# Patient Record
Sex: Female | Born: 1970
Health system: Southern US, Community
[De-identification: ages and names within clinical notes are randomized; demographics above are authoritative.]

## PROBLEM LIST (undated history)

## (undated) ENCOUNTER — Ambulatory Visit (HOSPITAL_COMMUNITY)

## (undated) DIAGNOSIS — Z91199 Patient's noncompliance with other medical treatment and regimen due to unspecified reason: Secondary | ICD-10-CM

## (undated) DIAGNOSIS — F419 Anxiety disorder, unspecified: Secondary | ICD-10-CM

## (undated) DIAGNOSIS — D649 Anemia, unspecified: Secondary | ICD-10-CM

## (undated) DIAGNOSIS — I639 Cerebral infarction, unspecified: Secondary | ICD-10-CM

## (undated) DIAGNOSIS — G4730 Sleep apnea, unspecified: Secondary | ICD-10-CM

## (undated) DIAGNOSIS — I351 Nonrheumatic aortic (valve) insufficiency: Secondary | ICD-10-CM

## (undated) DIAGNOSIS — E785 Hyperlipidemia, unspecified: Secondary | ICD-10-CM

## (undated) DIAGNOSIS — F32A Depression, unspecified: Secondary | ICD-10-CM

## (undated) DIAGNOSIS — M1990 Unspecified osteoarthritis, unspecified site: Secondary | ICD-10-CM

## (undated) DIAGNOSIS — E663 Overweight: Secondary | ICD-10-CM

## (undated) DIAGNOSIS — Z9119 Patient's noncompliance with other medical treatment and regimen: Secondary | ICD-10-CM

## (undated) DIAGNOSIS — G43909 Migraine, unspecified, not intractable, without status migrainosus: Secondary | ICD-10-CM

## (undated) DIAGNOSIS — Z8673 Personal history of transient ischemic attack (TIA), and cerebral infarction without residual deficits: Secondary | ICD-10-CM

## (undated) DIAGNOSIS — I119 Hypertensive heart disease without heart failure: Secondary | ICD-10-CM

## (undated) DIAGNOSIS — I10 Essential (primary) hypertension: Secondary | ICD-10-CM

## (undated) DIAGNOSIS — I6930 Unspecified sequelae of cerebral infarction: Secondary | ICD-10-CM

## (undated) DIAGNOSIS — F329 Major depressive disorder, single episode, unspecified: Secondary | ICD-10-CM

## (undated) HISTORY — DX: Patient's noncompliance with other medical treatment and regimen due to unspecified reason: Z91.199

## (undated) HISTORY — DX: Unspecified sequelae of cerebral infarction: I69.30

## (undated) HISTORY — DX: Anxiety disorder, unspecified: F41.9

## (undated) HISTORY — DX: Personal history of transient ischemic attack (TIA), and cerebral infarction without residual deficits: Z86.73

## (undated) HISTORY — DX: Major depressive disorder, single episode, unspecified: F32.9

## (undated) HISTORY — DX: Migraine, unspecified, not intractable, without status migrainosus: G43.909

## (undated) HISTORY — DX: Nonrheumatic aortic (valve) insufficiency: I35.1

## (undated) HISTORY — DX: Depression, unspecified: F32.A

## (undated) HISTORY — DX: Unspecified osteoarthritis, unspecified site: M19.90

## (undated) HISTORY — PX: GANGLION CYST EXCISION: SHX1691

## (undated) HISTORY — DX: Patient's noncompliance with other medical treatment and regimen: Z91.19

## (undated) HISTORY — DX: Anemia, unspecified: D64.9

## (undated) HISTORY — DX: Sleep apnea, unspecified: G47.30

## (undated) HISTORY — DX: Hypertensive heart disease without heart failure: I11.9

## (undated) HISTORY — PX: TONSILLECTOMY: SUR1361

## (undated) HISTORY — DX: Hyperlipidemia, unspecified: E78.5

## (undated) HISTORY — DX: Overweight: E66.3

---

## 1999-12-23 ENCOUNTER — Emergency Department (HOSPITAL_COMMUNITY): Admission: EM | Admit: 1999-12-23 | Discharge: 1999-12-23 | Payer: Self-pay | Admitting: Internal Medicine

## 1999-12-25 ENCOUNTER — Encounter: Admission: RE | Admit: 1999-12-25 | Discharge: 1999-12-25 | Payer: Self-pay | Admitting: Internal Medicine

## 1999-12-27 ENCOUNTER — Encounter: Admission: RE | Admit: 1999-12-27 | Discharge: 1999-12-27 | Payer: Self-pay | Admitting: Internal Medicine

## 2000-01-01 ENCOUNTER — Encounter: Admission: RE | Admit: 2000-01-01 | Discharge: 2000-01-01 | Payer: Self-pay | Admitting: Hematology and Oncology

## 2000-01-14 ENCOUNTER — Emergency Department (HOSPITAL_COMMUNITY): Admission: EM | Admit: 2000-01-14 | Discharge: 2000-01-14 | Payer: Self-pay | Admitting: Emergency Medicine

## 2000-02-07 ENCOUNTER — Encounter: Admission: RE | Admit: 2000-02-07 | Discharge: 2000-02-07 | Payer: Self-pay | Admitting: Internal Medicine

## 2000-02-11 ENCOUNTER — Encounter: Admission: RE | Admit: 2000-02-11 | Discharge: 2000-02-11 | Payer: Self-pay | Admitting: Internal Medicine

## 2000-07-21 ENCOUNTER — Encounter: Admission: RE | Admit: 2000-07-21 | Discharge: 2000-07-21 | Payer: Self-pay

## 2000-07-28 ENCOUNTER — Encounter: Admission: RE | Admit: 2000-07-28 | Discharge: 2000-07-28 | Payer: Self-pay | Admitting: Hematology and Oncology

## 2000-07-28 ENCOUNTER — Other Ambulatory Visit: Admission: RE | Admit: 2000-07-28 | Discharge: 2000-07-28 | Payer: Self-pay | Admitting: Hematology and Oncology

## 2000-08-17 ENCOUNTER — Emergency Department (HOSPITAL_COMMUNITY): Admission: EM | Admit: 2000-08-17 | Discharge: 2000-08-17 | Payer: Self-pay | Admitting: *Deleted

## 2000-09-22 ENCOUNTER — Encounter: Admission: RE | Admit: 2000-09-22 | Discharge: 2000-09-22 | Payer: Self-pay | Admitting: Hematology and Oncology

## 2000-11-17 ENCOUNTER — Emergency Department (HOSPITAL_COMMUNITY): Admission: EM | Admit: 2000-11-17 | Discharge: 2000-11-17 | Payer: Self-pay | Admitting: Emergency Medicine

## 2000-12-15 ENCOUNTER — Ambulatory Visit (HOSPITAL_BASED_OUTPATIENT_CLINIC_OR_DEPARTMENT_OTHER): Admission: RE | Admit: 2000-12-15 | Discharge: 2000-12-15 | Payer: Self-pay | Admitting: Neurology

## 2000-12-17 ENCOUNTER — Encounter: Admission: RE | Admit: 2000-12-17 | Discharge: 2000-12-17 | Payer: Self-pay | Admitting: Internal Medicine

## 2000-12-23 ENCOUNTER — Ambulatory Visit (HOSPITAL_COMMUNITY): Admission: RE | Admit: 2000-12-23 | Discharge: 2000-12-23 | Payer: Self-pay | Admitting: Internal Medicine

## 2002-03-13 ENCOUNTER — Emergency Department (HOSPITAL_COMMUNITY): Admission: EM | Admit: 2002-03-13 | Discharge: 2002-03-13 | Payer: Self-pay | Admitting: Emergency Medicine

## 2004-02-02 ENCOUNTER — Emergency Department (HOSPITAL_COMMUNITY): Admission: EM | Admit: 2004-02-02 | Discharge: 2004-02-02 | Payer: Self-pay | Admitting: Emergency Medicine

## 2004-09-27 ENCOUNTER — Emergency Department (HOSPITAL_COMMUNITY): Admission: EM | Admit: 2004-09-27 | Discharge: 2004-09-27 | Payer: Self-pay | Admitting: Emergency Medicine

## 2004-10-24 ENCOUNTER — Emergency Department (HOSPITAL_COMMUNITY): Admission: EM | Admit: 2004-10-24 | Discharge: 2004-10-24 | Payer: Self-pay | Admitting: Emergency Medicine

## 2004-12-30 ENCOUNTER — Emergency Department (HOSPITAL_COMMUNITY): Admission: EM | Admit: 2004-12-30 | Discharge: 2004-12-30 | Payer: Self-pay | Admitting: Emergency Medicine

## 2006-10-13 ENCOUNTER — Emergency Department (HOSPITAL_COMMUNITY): Admission: EM | Admit: 2006-10-13 | Discharge: 2006-10-13 | Payer: Self-pay | Admitting: Family Medicine

## 2006-10-15 ENCOUNTER — Emergency Department (HOSPITAL_COMMUNITY): Admission: EM | Admit: 2006-10-15 | Discharge: 2006-10-15 | Payer: Self-pay | Admitting: Family Medicine

## 2006-10-16 ENCOUNTER — Emergency Department (HOSPITAL_COMMUNITY): Admission: EM | Admit: 2006-10-16 | Discharge: 2006-10-16 | Payer: Self-pay | Admitting: Emergency Medicine

## 2006-11-10 ENCOUNTER — Encounter: Admission: RE | Admit: 2006-11-10 | Discharge: 2006-12-07 | Payer: Self-pay | Admitting: Orthopaedic Surgery

## 2006-12-12 ENCOUNTER — Encounter: Admission: RE | Admit: 2006-12-12 | Discharge: 2006-12-12 | Payer: Self-pay | Admitting: Orthopaedic Surgery

## 2007-01-05 ENCOUNTER — Encounter (INDEPENDENT_AMBULATORY_CARE_PROVIDER_SITE_OTHER): Payer: Self-pay | Admitting: Orthopedic Surgery

## 2007-01-05 ENCOUNTER — Ambulatory Visit (HOSPITAL_BASED_OUTPATIENT_CLINIC_OR_DEPARTMENT_OTHER): Admission: RE | Admit: 2007-01-05 | Discharge: 2007-01-05 | Payer: Self-pay | Admitting: Orthopedic Surgery

## 2007-05-13 ENCOUNTER — Other Ambulatory Visit: Admission: RE | Admit: 2007-05-13 | Discharge: 2007-05-13 | Payer: Self-pay | Admitting: Family Medicine

## 2007-06-02 ENCOUNTER — Encounter: Admission: RE | Admit: 2007-06-02 | Discharge: 2007-06-02 | Payer: Self-pay | Admitting: Family Medicine

## 2008-12-26 ENCOUNTER — Emergency Department (HOSPITAL_COMMUNITY): Admission: EM | Admit: 2008-12-26 | Discharge: 2008-12-26 | Payer: Self-pay | Admitting: Emergency Medicine

## 2009-10-22 ENCOUNTER — Emergency Department (HOSPITAL_COMMUNITY): Admission: EM | Admit: 2009-10-22 | Discharge: 2009-10-23 | Payer: Self-pay | Admitting: Emergency Medicine

## 2010-03-18 ENCOUNTER — Emergency Department (HOSPITAL_COMMUNITY): Admission: EM | Admit: 2010-03-18 | Discharge: 2010-03-18 | Payer: Self-pay | Admitting: Emergency Medicine

## 2010-06-23 ENCOUNTER — Encounter: Payer: Self-pay | Admitting: Family Medicine

## 2010-08-14 LAB — DIFFERENTIAL
Basophils Absolute: 0 10*3/uL (ref 0.0–0.1)
Basophils Relative: 1 % (ref 0–1)
Eosinophils Absolute: 0.2 10*3/uL (ref 0.0–0.7)
Eosinophils Relative: 4 % (ref 0–5)
Lymphocytes Relative: 35 % (ref 12–46)
Lymphs Abs: 1.5 10*3/uL (ref 0.7–4.0)
Monocytes Absolute: 0.4 10*3/uL (ref 0.1–1.0)
Monocytes Relative: 9 % (ref 3–12)
Neutro Abs: 2.2 10*3/uL (ref 1.7–7.7)
Neutrophils Relative %: 51 % (ref 43–77)

## 2010-08-14 LAB — POCT CARDIAC MARKERS
CKMB, poc: <1 ng/mL — ABNORMAL LOW (ref 1.0–8.0)
Myoglobin, poc: 77.7 ng/mL (ref 12–200)
Troponin i, poc: <0.05 ng/mL (ref 0.00–0.09)

## 2010-08-14 LAB — BASIC METABOLIC PANEL
BUN: 11 mg/dL (ref 6–23)
CO2: 25 mEq/L (ref 19–32)
Calcium: 8.9 mg/dL (ref 8.4–10.5)
Chloride: 105 mEq/L (ref 96–112)
Creatinine, Ser: 0.76 mg/dL (ref 0.4–1.2)
GFR calc Af Amer: >60 mL/min (ref >60)
GFR calc non Af Amer: >60 mL/min (ref >60)
Glucose, Bld: 101 mg/dL — ABNORMAL HIGH (ref 70–99)
Potassium: 3 mEq/L — ABNORMAL LOW (ref 3.5–5.1)
Sodium: 137 mEq/L (ref 135–145)

## 2010-08-14 LAB — D-DIMER, QUANTITATIVE: D-Dimer, Quant: 0.59 ug/mL-FEU — ABNORMAL HIGH (ref 0.00–0.48)

## 2010-08-14 LAB — CBC
HCT: 41.5 % (ref 36.0–46.0)
Hemoglobin: 14 g/dL (ref 12.0–15.0)
MCH: 28.7 pg (ref 26.0–34.0)
MCHC: 33.7 g/dL (ref 30.0–36.0)
MCV: 85 fL (ref 78.0–100.0)
Platelets: 242 10*3/uL (ref 150–400)
RBC: 4.88 MIL/uL (ref 3.87–5.11)
RDW: 14.6 % (ref 11.5–15.5)
WBC: 4.3 10*3/uL (ref 4.0–10.5)

## 2010-09-08 LAB — POCT I-STAT, CHEM 8
BUN: 14 mg/dL (ref 6–23)
Calcium, Ion: 1.14 mmol/L (ref 1.12–1.32)
Chloride: 104 mEq/L (ref 96–112)
Creatinine, Ser: 0.8 mg/dL (ref 0.4–1.2)
Glucose, Bld: 88 mg/dL (ref 70–99)
HCT: 43 % (ref 36.0–46.0)
Hemoglobin: 14.6 g/dL (ref 12.0–15.0)
Potassium: 3.3 mEq/L — ABNORMAL LOW (ref 3.5–5.1)
Sodium: 141 mEq/L (ref 135–145)
TCO2: 25 mmol/L (ref 0–100)

## 2010-10-15 NOTE — Op Note (Signed)
NAMETAYJA, MANZER           ACCOUNT NO.:  [id number]   MEDICAL RECORD NO.:  [id number]          PATIENT TYPE:  AMB   LOCATION:  [facility]                         FACILITY:  [facility]   PHYSICIAN:  Deidre Ala, M.D.    DATE OF BIRTH:  16-Aug-1970   DATE OF PROCEDURE:  01/05/2007  DATE OF DISCHARGE:                               OPERATIVE REPORT   PREOPERATIVE DIAGNOSIS:  Painful dorsal ganglion cyst, left wrist,  classic position.   POSTOPERATIVE DIAGNOSIS:  Painful dorsal ganglion cyst, left wrist,  classic position, with extensor tenosynovitis of third and fourth  extensor tendon sheath.   OPERATION:  1. Excision dorsal ganglion cyst to capsule with cuff of capsule, left      dorsal wrist.  2. Extensor tenosynovectomy of third and fourth extensor tendon      sheath.   SURGEON:  1. Charlesetta Shanks, M.D.   ASSISTANT:  Phineas Semen, P.A.   ANESTHESIA:  General with LMA.   CULTURES:  None.   DRAINS:  None.   BLOOD LOSS:  Minimal.   TOURNIQUET TIME:  36 minutes.   PATHOLOGIC FINDINGS AND HISTORY:  Brandy Roberts presented with a  dorsal wrist ganglion cyst from Dr. Loleta Chance in [place].  She had  left wrist pain that was severe starting on December 06, 2006.  Swelling in  her wrist went down, came into the office after seeing Dr. Loleta Chance.  We  tried to decompress the cyst after aspirating it.  An x-ray showed no  bony abnormalities.  We placed cortisone and Marcaine.  She came back  and she was still miserable.  The cyst measured about 1.5 x 1 cm over  the classic position in the wrist.  It had decompressed a bit and she  wanted to go ahead and have it incised.  At surgery the cyst had  somewhat decompressed.  There were multiple blebs within it.  It was  nesting between the third and fourth extensor tendon sheath and we did  release the extensor retinaculum partially to gain access to the base of  the ganglion.  We retracted the extensor carpi radialis and EPL one way  and  the extensor tendons in the indicus proprius and extensor digitorum  communis the other direction.  The ganglion had several stalks and went  down into the base of the tendon sheaths.  There was some fluid milkable  out of the third and fourth tendon sheath.  There were blebs down at the  classic capsular base of the bald head of the capitate base and the  scapholunate ligament area and lunate capitate lunate joint and we  excised these with a cuff of capsule as well as the overlying adjacent  base of the tendon sheath that was adherent to the ganglion cyst and  removed this in its entirety.  The specimen that we got out measured  about 1 x 1.5 cm.  It was sent for specimen.  Again we did cauterize the  capsule over a patch measuring about 1.5 x 1.5 cm to prevent bleb  recurrence.  Small cuff of capsule was taken out  in the area where the  stalk was producing the blebs in the area of the lunate at the base of  the capitate and over toward the scapholunate dorsal capsule.  Small  amount was taken and the area was cauterized.   DESCRIPTION OF PROCEDURE:  With adequate anesthesia obtained using LMA  technique, 1 gram Ancef given IV prophylaxis, the patient was placed in  the supine position.  The left upper extremity was prepped from the  32fingertips to the upper forearm in standard fashion.  After standard  prepping and draping, Esmarch examination was used and the tourniquet  let up to 250 mmHg.  A longitudinal skin incision was then made over the  dorsal prominence.  Incision was deepened sharply with a knife and  hemostasis obtained using the Bovie electrocoagulator.  Dissection was  carried down to the bulge through the 3/4 interspace.  We identified the  extensor retinaculum, released it for exposure and then retracted the  tendons of the ECRL and the EPL one way and the extensor digitorum and  indicus proprius the other direction.  Some of the base of the  tenosynovium of these tendon  sheaths were intimately involved with a  ganglion cyst and were excised with it.  We then dissected around the  multilobate cyst which had been decompressed and traced it down to its  stalk which still had some blebs coming out of it at the capsule as  listed above in the scapholunate and base of the capitate area.  This  capsule was then excised from that area in two small spots and then  cauterized with the needle point Bovie as above.  Irrigation was carried  out.  The tendons were then checked for their continuity and function.  Everything was good so we closed the subcutaneum with 3-0 Vicryl, two  stitches, and then a 4-0 Monocryl was placed subcuticular with Steri-  Strips.  Marcaine 0.5% was placed in and about the wound.  A bulky  sterile compressive dressing was applied with volar plaster splint and  slight cock-up with Ace wrap.  The patient, having tolerated procedure  well, was awakened, taken to recovery room in satisfactory condition to  be discharged per outpatient routine, given Vicodin for pain and told to  call the office for recheck on Friday.           ______________________________  V. Charlesetta Shanks, M.D.     VEP/MEDQ  D:  01/05/2007  T:  01/06/2007  Job:  [id number]   cc:   Annia Friendly. Loleta Chance, MD  Fax: [phone]

## 2011-03-17 LAB — POCT I-STAT 4, (NA,K, GLUC, HGB,HCT)
Glucose, Bld: 103 — ABNORMAL HIGH
HCT: 45
Hemoglobin: 15.3 — ABNORMAL HIGH
Operator id: 268271
Potassium: 3.8
Sodium: 141

## 2011-03-17 LAB — POCT PREGNANCY, URINE
Operator id: 268271
Preg Test, Ur: NEGATIVE

## 2011-10-04 ENCOUNTER — Emergency Department (HOSPITAL_COMMUNITY)
Admission: EM | Admit: 2011-10-04 | Discharge: 2011-10-04 | Disposition: A | Discharge status: Home / Self Care | Payer: No Typology Code available for payment source | Attending: Emergency Medicine | Admitting: Emergency Medicine

## 2011-10-04 ENCOUNTER — Emergency Department (HOSPITAL_COMMUNITY): Payer: No Typology Code available for payment source

## 2011-10-04 ENCOUNTER — Encounter (HOSPITAL_COMMUNITY): Payer: Self-pay | Admitting: Emergency Medicine

## 2011-10-04 DIAGNOSIS — S335XXA Sprain of ligaments of lumbar spine, initial encounter: Secondary | ICD-10-CM | POA: Insufficient documentation

## 2011-10-04 DIAGNOSIS — S39012A Strain of muscle, fascia and tendon of lower back, initial encounter: Secondary | ICD-10-CM

## 2011-10-04 DIAGNOSIS — S139XXA Sprain of joints and ligaments of unspecified parts of neck, initial encounter: Secondary | ICD-10-CM | POA: Insufficient documentation

## 2011-10-04 DIAGNOSIS — Y92410 Unspecified street and highway as the place of occurrence of the external cause: Secondary | ICD-10-CM | POA: Insufficient documentation

## 2011-10-04 DIAGNOSIS — S161XXA Strain of muscle, fascia and tendon at neck level, initial encounter: Secondary | ICD-10-CM

## 2011-10-04 DIAGNOSIS — I10 Essential (primary) hypertension: Secondary | ICD-10-CM | POA: Insufficient documentation

## 2011-10-04 HISTORY — DX: Essential (primary) hypertension: I10

## 2011-10-04 MED ORDER — KETOROLAC TROMETHAMINE 60 MG/2ML IM SOLN
60.0000 mg | Freq: Once | INTRAMUSCULAR | Status: AC
  Administered 2011-10-04: 60 mg via INTRAMUSCULAR
  Filled 2011-10-04: qty 2

## 2011-10-04 MED ORDER — HYDROCODONE-ACETAMINOPHEN 5-325 MG PO TABS: 1.0000 | ORAL_TABLET | Freq: Four times a day (QID) | ORAL | Status: AC | PRN

## 2011-10-04 MED ORDER — CYCLOBENZAPRINE HCL 10 MG PO TABS: 10.0000 mg | ORAL_TABLET | Freq: Three times a day (TID) | ORAL | Status: AC | PRN

## 2011-10-04 MED ORDER — IBUPROFEN 800 MG PO TABS: 800.0000 mg | ORAL_TABLET | Freq: Three times a day (TID) | ORAL | Status: AC | PRN
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
## 2011-10-04 NOTE — ED Provider Notes (Signed)
Medical screening examination/treatment/procedure(s) were performed by non-physician practitioner and as supervising physician I was immediately available for consultation/collaboration.   Nat Christen, MD 10/04/11 1259
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
## 2011-11-03 ENCOUNTER — Encounter (HOSPITAL_COMMUNITY): Payer: Self-pay | Admitting: *Deleted

## 2011-11-03 ENCOUNTER — Emergency Department (HOSPITAL_COMMUNITY)
Admission: EM | Admit: 2011-11-03 | Discharge: 2011-11-04 | Disposition: A | Discharge status: Home / Self Care | Payer: Self-pay | Attending: Emergency Medicine | Admitting: Emergency Medicine

## 2011-11-03 ENCOUNTER — Emergency Department (HOSPITAL_COMMUNITY): Payer: Self-pay

## 2011-11-03 DIAGNOSIS — K449 Diaphragmatic hernia without obstruction or gangrene: Secondary | ICD-10-CM | POA: Insufficient documentation

## 2011-11-03 DIAGNOSIS — R109 Unspecified abdominal pain: Secondary | ICD-10-CM | POA: Insufficient documentation

## 2011-11-03 DIAGNOSIS — I10 Essential (primary) hypertension: Secondary | ICD-10-CM | POA: Insufficient documentation

## 2011-11-03 LAB — URINE MICROSCOPIC-ADD ON

## 2011-11-03 LAB — POCT I-STAT, CHEM 8
BUN: 11 mg/dL (ref 6–23)
Chloride: 103 mEq/L (ref 96–112)
Sodium: 142 mEq/L (ref 135–145)

## 2011-11-03 LAB — URINALYSIS, ROUTINE W REFLEX MICROSCOPIC
Bilirubin Urine: NEGATIVE
Glucose, UA: NEGATIVE mg/dL
Glucose, UA: NEGATIVE mg/dL
Ketones, ur: NEGATIVE mg/dL
Nitrite: NEGATIVE
Specific Gravity, Urine: 1.014 (ref 1.005–1.030)
Urobilinogen, UA: 0.2 mg/dL (ref 0.0–1.0)
pH: 7 (ref 5.0–8.0)
pH: 6.5 (ref 5.0–8.0)

## 2011-11-03 LAB — DIFFERENTIAL
Eosinophils Relative: 2 % (ref 0–5)
Lymphocytes Relative: 39 % (ref 12–46)
Lymphs Abs: 3 10*3/uL (ref 0.7–4.0)
Neutrophils Relative %: 51 % (ref 43–77)

## 2011-11-03 LAB — CBC
MCV: 84.3 fL (ref 78.0–100.0)
Platelets: 259 10*3/uL (ref 150–400)
RBC: 4.64 MIL/uL (ref 3.87–5.11)
WBC: 7.7 10*3/uL (ref 4.0–10.5)

## 2011-11-03 LAB — POCT PREGNANCY, URINE: Preg Test, Ur: NEGATIVE

## 2011-11-03 MED ORDER — POTASSIUM CHLORIDE CRYS ER 20 MEQ PO TBCR
40.0000 meq | EXTENDED_RELEASE_TABLET | Freq: Once | ORAL | Status: AC
  Administered 2011-11-03: 40 meq via ORAL
  Filled 2011-11-03: qty 2

## 2011-11-03 MED ORDER — IOHEXOL 300 MG/ML  SOLN
20.0000 mL | INTRAMUSCULAR | Status: AC
Start: 2011-11-03 — End: 2011-11-03
  Administered 2011-11-03 (×2): 20 mL via ORAL

## 2011-11-03 MED ORDER — ONDANSETRON HCL 4 MG/2ML IJ SOLN
4.0000 mg | Freq: Once | INTRAMUSCULAR | Status: AC
  Administered 2011-11-03: 4 mg via INTRAVENOUS
  Filled 2011-11-03: qty 2

## 2011-11-03 MED ORDER — SODIUM CHLORIDE 0.9 % IV SOLN
INTRAVENOUS | Status: DC
  Administered 2011-11-03: 125 mL/h via INTRAVENOUS

## 2011-11-03 MED ORDER — HYDROMORPHONE HCL PF 1 MG/ML IJ SOLN
1.0000 mg | Freq: Once | INTRAMUSCULAR | Status: AC
  Administered 2011-11-03: 1 mg via INTRAVENOUS
  Filled 2011-11-03: qty 1

## 2011-11-03 NOTE — ED Notes (Signed)
Pt is here with pain to entire stomach since yesterday.  PT reports nausea.  Pt denies urinary symptoms.  No vaginal discharge or bleeding.  LMP-finishing now.

## 2011-11-03 NOTE — ED Provider Notes (Signed)
History     CSN: [id number]  Arrival date & time 11/03/11  [place]   First MD Initiated Contact with Patient 11/03/11 2212      Chief Complaint  Patient presents with  . Abdominal Pain    (Consider location/radiation/quality/duration/timing/severity/associated sxs/prior treatment) HPI Comments: Brandy Roberts is a 41 y.o. Female with abdominal pain. That started last night. She has nausea without vomiting. She denies fever, chills, shortness of breath, chest pain, or weakness. She has low back pain, associated with the abdominal discomfort. She's tried over-the-counter analgesics without relief. She's never had this previously. The pain is worse when she tries to sleep. She denies recent diarrhea, or constipation.  Patient is a 41 y.o. female presenting with abdominal pain. The history is provided by the patient.  Abdominal Pain The primary symptoms of the illness include abdominal pain.    Past Medical History  Diagnosis Date  . Hypertension     Past Surgical History  Procedure Date  . Cesarean section   . Tonsillectomy   . Bone cyst excision     No family history on file.  History  Substance Use Topics  . Smoking status: Current Everyday Smoker  . Smokeless tobacco: Not on file  . Alcohol Use: No    OB History    Grav Para Term Preterm Abortions TAB SAB Ect Mult Living                  Review of Systems  Gastrointestinal: Positive for abdominal pain.  All other systems reviewed and are negative.    Allergies  Review of patient's allergies indicates no known allergies.  Home Medications   Current Outpatient Rx  Name Route Sig Dispense Refill  . FAMOTIDINE 20 MG PO TABS Oral Take 1 tablet (20 mg total) by mouth 2 (two) times daily. 30 tablet 0  . IBUPROFEN 800 MG PO TABS Oral Take 1 tablet (800 mg total) by mouth 3 (three) times daily. 21 tablet 0    BP 178/88  Pulse 78  Temp(Src) 98.7 F (37.1 C) (Oral)  Resp 16  SpO2 100%  LMP  11/03/2011  Physical Exam  Nursing note and vitals reviewed. Constitutional: She is oriented to person, place, and time. She appears well-developed and well-nourished.  HENT:  Head: Normocephalic and atraumatic.  Eyes: Conjunctivae and EOM are normal. Pupils are equal, round, and reactive to light.  Neck: Normal range of motion and phonation normal. Neck supple.  Cardiovascular: Normal rate, regular rhythm and intact distal pulses.   Pulmonary/Chest: Effort normal and breath sounds normal. She exhibits no tenderness.  Abdominal: Soft. She exhibits no distension and no mass. There is tenderness (moderate diffuse). There is no rebound and no guarding.  Musculoskeletal: Normal range of motion. She exhibits no edema and no tenderness.  Neurological: She is alert and oriented to person, place, and time. She has normal strength. She exhibits normal muscle tone.  Skin: Skin is warm and dry.  Psychiatric: She has a normal mood and affect. Her behavior is normal. Judgment and thought content normal.    ED Course  Procedures (including critical care time)  Labs Reviewed  URINALYSIS, ROUTINE W REFLEX MICROSCOPIC - Abnormal; Notable for the following:    APPearance CLOUDY (*)    Hgb urine dipstick LARGE (*)    Protein, ur 100 (*)    Leukocytes, UA TRACE (*)    All other components within normal limits  POCT I-STAT, CHEM 8 - Abnormal; Notable for the following:  Potassium 3.1 (*)    All other components within normal limits  URINALYSIS, ROUTINE W REFLEX MICROSCOPIC - Abnormal; Notable for the following:    Hgb urine dipstick TRACE (*)    Protein, ur 30 (*)    All other components within normal limits  CBC  DIFFERENTIAL  POCT PREGNANCY, URINE  URINE MICROSCOPIC-ADD ON  URINE MICROSCOPIC-ADD ON  LAB REPORT - SCANNED   Ct Abdomen Pelvis W Contrast  11/04/2011  *RADIOLOGY REPORT*  Clinical Data: Abdominal pain and nausea.  CT ABDOMEN AND PELVIS WITH CONTRAST  Technique:  Multidetector CT  imaging of the abdomen and pelvis was performed following the standard protocol during bolus administration of intravenous contrast.  Contrast: OMNIPAQUE IOHEXOL 300 MG/ML  SOLN, 1 OMNIPAQUE IOHEXOL 300 MG/ML  SOLN  Comparison: No priors.  Findings:  Lung Bases: There is a small hiatal hernia.  Abdomen/Pelvis:  The enhanced appearance of the liver, gallbladder, pancreas, spleen, bilateral adrenal glands and bilateral kidneys is unremarkable.  No ascites or pneumoperitoneum.  No pathologic distension of bowel.  No definite pathologic lymphadenopathy identified within the abdomen or pelvis.  Uterus, bilateral ovaries and urinary bladder are unremarkable in appearance.  Normal appendix.  The  Musculoskeletal: There are no aggressive appearing lytic or blastic lesions noted in the visualized portions of the skeleton.  IMPRESSION: 1.  No acute findings in the abdomen or pelvis to account for the patient's symptoms. 2.  Normal appendix. 3.  Small hiatal hernia.  Original Report Authenticated By: Florencia Reasons, M.D.     1. Abdominal pain   2. Hiatal hernia       MDM  Nonspecific short term abdominal pain, moderately severe. Advanced imaging ordered due to nature of pain.   Disposition per Dr. Dennie Fetters, MD 11/05/11 [phone]

## 2011-11-04 MED ORDER — IBUPROFEN 800 MG PO TABS: 800.0000 mg | ORAL_TABLET | Freq: Three times a day (TID) | ORAL | Status: AC

## 2011-11-04 MED ORDER — FAMOTIDINE 20 MG PO TABS: 20.0000 mg | ORAL_TABLET | Freq: Two times a day (BID) | ORAL | Status: DC

## 2011-11-04 MED ORDER — FAMOTIDINE 20 MG PO TABS
20.0000 mg | ORAL_TABLET | Freq: Once | ORAL | Status: AC
  Administered 2011-11-04: 20 mg via ORAL
  Filled 2011-11-04: qty 1

## 2011-11-04 MED ORDER — IOHEXOL 300 MG/ML  SOLN
100.0000 mL | Freq: Once | INTRAMUSCULAR | Status: AC | PRN
  Administered 2011-11-04: 100 mL via INTRAVENOUS

## 2011-11-04 NOTE — Discharge Instructions (Signed)

## 2011-11-04 NOTE — ED Notes (Signed)
Patient care assumed at 11:30 PM Dr. Effie Shy. Patient requesting to be discharged home states she's feeling better and very much wants to leave. I reviewed CT scan as below in there are no acute findings. Patient has mild diffuse tenderness without any point tenderness. No acute abdomen. No right upper quadrant tenderness and negative Murphy's sign. I doubt acute cholecystitis. Plan outpatient followup with prescription for Motrin Pepcid provided. Patient agrees to return to the emergency department for any worsening condition.  Results for orders placed during the hospital encounter of 11/03/11  URINALYSIS, ROUTINE W REFLEX MICROSCOPIC      Component Value Range   Color, Urine PINK  YELLOW    APPearance CLOUDY (*) CLEAR    Specific Gravity, Urine 1.014  1.005 - 1.030    pH 7.0  5.0 - 8.0    Glucose, UA NEGATIVE  NEGATIVE (mg/dL)   Hgb urine dipstick LARGE (*) NEGATIVE    Bilirubin Urine NEGATIVE  NEGATIVE    Ketones, ur NEGATIVE  NEGATIVE (mg/dL)   Protein, ur 161 (*) NEGATIVE (mg/dL)   Urobilinogen, UA 0.2  0.0 - 1.0 (mg/dL)   Nitrite NEGATIVE  NEGATIVE    Leukocytes, UA TRACE (*) NEGATIVE   CBC      Component Value Range   WBC 7.7  4.0 - 10.5 (K/uL)   RBC 4.64  3.87 - 5.11 (MIL/uL)   Hemoglobin 13.2  12.0 - 15.0 (g/dL)   HCT 09.6  04.5 - 40.9 (%)   MCV 84.3  78.0 - 100.0 (fL)   MCH 28.4  26.0 - 34.0 (pg)   MCHC 33.8  30.0 - 36.0 (g/dL)   RDW 81.1  91.4 - 78.2 (%)   Platelets 259  150 - 400 (K/uL)  DIFFERENTIAL      Component Value Range   Neutrophils Relative 51  43 - 77 (%)   Neutro Abs 3.9  1.7 - 7.7 (K/uL)   Lymphocytes Relative 39  12 - 46 (%)   Lymphs Abs 3.0  0.7 - 4.0 (K/uL)   Monocytes Relative 8  3 - 12 (%)   Monocytes Absolute 0.6  0.1 - 1.0 (K/uL)   Eosinophils Relative 2  0 - 5 (%)   Eosinophils Absolute 0.2  0.0 - 0.7 (K/uL)   Basophils Relative 0  0 - 1 (%)   Basophils Absolute 0.0  0.0 - 0.1 (K/uL)  POCT PREGNANCY, URINE      Component Value Range   Preg  Test, Ur NEGATIVE  NEGATIVE   POCT I-STAT, CHEM 8      Component Value Range   Sodium 142  135 - 145 (mEq/L)   Potassium 3.1 (*) 3.5 - 5.1 (mEq/L)   Chloride 103  96 - 112 (mEq/L)   BUN 11  6 - 23 (mg/dL)   Creatinine, Ser 9.56  0.50 - 1.10 (mg/dL)   Glucose, Bld 98  70 - 99 (mg/dL)   Calcium, Ion 2.13  0.86 - 1.32 (mmol/L)   TCO2 25  0 - 100 (mmol/L)   Hemoglobin 15.0  12.0 - 15.0 (g/dL)   HCT 57.8  46.9 - 62.9 (%)  URINE MICROSCOPIC-ADD ON      Component Value Range   Squamous Epithelial / LPF RARE  RARE    WBC, UA 0-2  <3 (WBC/hpf)   RBC / HPF TOO NUMEROUS TO COUNT  <3 (RBC/hpf)  URINALYSIS, ROUTINE W REFLEX MICROSCOPIC      Component Value Range   Color, Urine YELLOW  YELLOW  APPearance CLEAR  CLEAR    Specific Gravity, Urine 1.017  1.005 - 1.030    pH 6.5  5.0 - 8.0    Glucose, UA NEGATIVE  NEGATIVE (mg/dL)   Hgb urine dipstick TRACE (*) NEGATIVE    Bilirubin Urine NEGATIVE  NEGATIVE    Ketones, ur NEGATIVE  NEGATIVE (mg/dL)   Protein, ur 30 (*) NEGATIVE (mg/dL)   Urobilinogen, UA 0.2  0.0 - 1.0 (mg/dL)   Nitrite NEGATIVE  NEGATIVE    Leukocytes, UA NEGATIVE  NEGATIVE   URINE MICROSCOPIC-ADD ON      Component Value Range   Squamous Epithelial / LPF RARE  RARE    RBC / HPF 0-2  <3 (RBC/hpf)   Ct Abdomen Pelvis W Contrast  11/04/2011  *RADIOLOGY REPORT*  Clinical Data: Abdominal pain and nausea.  CT ABDOMEN AND PELVIS WITH CONTRAST  Technique:  Multidetector CT imaging of the abdomen and pelvis was performed following the standard protocol during bolus administration of intravenous contrast.  Contrast: OMNIPAQUE IOHEXOL 300 MG/ML  SOLN, 1 OMNIPAQUE IOHEXOL 300 MG/ML  SOLN  Comparison: No priors.  Findings:  Lung Bases: There is a small hiatal hernia.  Abdomen/Pelvis:  The enhanced appearance of the liver, gallbladder, pancreas, spleen, bilateral adrenal glands and bilateral kidneys is unremarkable.  No ascites or pneumoperitoneum.  No pathologic distension of bowel.   No definite pathologic lymphadenopathy identified within the abdomen or pelvis.  Uterus, bilateral ovaries and urinary bladder are unremarkable in appearance.  Normal appendix.  The  Musculoskeletal: There are no aggressive appearing lytic or blastic lesions noted in the visualized portions of the skeleton.  IMPRESSION: 1.  No acute findings in the abdomen or pelvis to account for the patient's symptoms. 2.  Normal appendix. 3.  Small hiatal hernia.  Original Report Authenticated By: Florencia Reasons, M.D.      Sunnie Nielsen, MD 11/04/11 [phone]

## 2011-11-04 NOTE — ED Notes (Signed)
Patient back from CT.

## 2011-11-04 NOTE — ED Notes (Signed)
Patient transported to CT 

## 2013-02-08 ENCOUNTER — Encounter (HOSPITAL_COMMUNITY): Payer: Self-pay | Admitting: *Deleted

## 2013-02-08 ENCOUNTER — Emergency Department (HOSPITAL_COMMUNITY)
Admission: EM | Admit: 2013-02-08 | Discharge: 2013-02-08 | Disposition: A | Discharge status: Home / Self Care | Payer: No Typology Code available for payment source | Attending: Emergency Medicine | Admitting: Emergency Medicine

## 2013-02-08 DIAGNOSIS — R51 Headache: Secondary | ICD-10-CM | POA: Insufficient documentation

## 2013-02-08 DIAGNOSIS — K029 Dental caries, unspecified: Secondary | ICD-10-CM | POA: Insufficient documentation

## 2013-02-08 DIAGNOSIS — I10 Essential (primary) hypertension: Secondary | ICD-10-CM | POA: Insufficient documentation

## 2013-02-08 DIAGNOSIS — Z79899 Other long term (current) drug therapy: Secondary | ICD-10-CM | POA: Insufficient documentation

## 2013-02-08 DIAGNOSIS — F17200 Nicotine dependence, unspecified, uncomplicated: Secondary | ICD-10-CM | POA: Insufficient documentation

## 2013-02-08 LAB — URINALYSIS, ROUTINE W REFLEX MICROSCOPIC
Glucose, UA: NEGATIVE mg/dL
Ketones, ur: NEGATIVE mg/dL
Leukocytes, UA: NEGATIVE
Nitrite: NEGATIVE
Specific Gravity, Urine: 1.02 (ref 1.005–1.030)
pH: 7.5 (ref 5.0–8.0)

## 2013-02-08 LAB — BASIC METABOLIC PANEL
Chloride: 100 mEq/L (ref 96–112)
Creatinine, Ser: 0.75 mg/dL (ref 0.50–1.10)
GFR calc Af Amer: >90 mL/min (ref >90)
Sodium: 137 mEq/L (ref 135–145)

## 2013-02-08 LAB — URINE MICROSCOPIC-ADD ON

## 2013-02-08 MED ORDER — CLONIDINE HCL 0.1 MG PO TABS
0.1000 mg | ORAL_TABLET | Freq: Once | ORAL | Status: AC
  Administered 2013-02-08: 0.1 mg via ORAL
  Filled 2013-02-08: qty 1

## 2013-02-08 MED ORDER — OXYCODONE-ACETAMINOPHEN 5-325 MG PO TABS: 2.0000 | ORAL_TABLET | ORAL | Status: DC | PRN

## 2013-02-08 MED ORDER — HYDROCHLOROTHIAZIDE 25 MG PO TABS: 25.0000 mg | ORAL_TABLET | Freq: Every day | ORAL | Status: DC

## 2013-02-08 MED ORDER — MORPHINE SULFATE 4 MG/ML IJ SOLN
6.0000 mg | Freq: Once | INTRAMUSCULAR | Status: AC
  Administered 2013-02-08: 6 mg via INTRAMUSCULAR
  Filled 2013-02-08: qty 2

## 2013-02-08 MED ORDER — HYDROCHLOROTHIAZIDE 25 MG PO TABS
25.0000 mg | ORAL_TABLET | Freq: Every day | ORAL | Status: DC
  Administered 2013-02-08: 25 mg via ORAL
  Filled 2013-02-08: qty 1

## 2013-02-08 MED ORDER — PENICILLIN V POTASSIUM 500 MG PO TABS: 500.0000 mg | ORAL_TABLET | Freq: Four times a day (QID) | ORAL | Status: AC

## 2013-02-08 MED ORDER — CARVEDILOL 25 MG PO TABS: 12.5000 mg | ORAL_TABLET | Freq: Two times a day (BID) | ORAL | Status: DC

## 2013-02-08 MED ORDER — HYDROCODONE-ACETAMINOPHEN 5-325 MG PO TABS
1.0000 | ORAL_TABLET | Freq: Once | ORAL | Status: AC
  Administered 2013-02-08: 1 via ORAL
  Filled 2013-02-08: qty 1

## 2013-02-08 MED ORDER — CARVEDILOL 6.25 MG PO TABS
6.2500 mg | ORAL_TABLET | Freq: Two times a day (BID) | ORAL | Status: DC
  Administered 2013-02-08: 6.25 mg via ORAL
  Filled 2013-02-08 (×2): qty 1

## 2013-02-08 NOTE — ED Notes (Signed)
Provided patient with [place] sandwich bag and sprite. Tolerating well

## 2013-02-08 NOTE — ED Notes (Signed)
Pt given a warm pack to place on the left side of her face

## 2013-02-08 NOTE — ED Notes (Signed)
Pt has been out of blood pressure medications for 2 years

## 2013-02-08 NOTE — ED Notes (Signed)
Pt is here with left upper tooth pain and left ear pain

## 2013-02-08 NOTE — ED Notes (Signed)
Notified MD of patient's BP

## 2013-02-08 NOTE — ED Notes (Signed)
Pt here for dental pain on left side of face that has now affected the entire right side of face and ear ache. sts started a few days ago and no better, has not attempted to see dentist.

## 2013-02-08 NOTE — ED Notes (Signed)
MD at bedside. 

## 2013-02-08 NOTE — ED Notes (Signed)
Pt reports headache too

## 2013-02-08 NOTE — ED Provider Notes (Signed)
TIME SEEN: 3:40 PM  CHIEF COMPLAINT: Dental pain, headache  HPI: Patient is a 42 y.o. African American female with a history of hypertension who has been off medication for over 2 years who presents to the emergency department with complaints of right-sided upper molar pain, right-sided headache and facial pain, right-sided ear pain for the past several days. She has a loose tooth that she has known about for several weeks. No fevers or chills. No drainage of pus from her mouth. Facial swelling, redness or warmth. She states that she has not been taking her blood pressure medication because she does not have insurance to have a primary care physician or get her prescriptions filled. She states that she does have some intermittent blurry vision. No chest pain or shortness of breath. No numbness, tingling or focal weakness. No thunderclap headache. In the ED patient's blood pressure is 250/80s.  ROS: See HPI Constitutional: no fever  Eyes: no drainage  ENT: no runny nose   Cardiovascular:  no chest pain  Resp: no SOB  GI: no vomiting GU: no dysuria Integumentary: no rash  Allergy: no hives  Musculoskeletal: no leg swelling  Neurological: no slurred speech ROS otherwise negative  PAST MEDICAL HISTORY/PAST SURGICAL HISTORY:  Past Medical History  Diagnosis Date  . Hypertension     MEDICATIONS:  Prior to Admission medications   Medication Sig Start Date End Date Taking? Authorizing Provider  famotidine (PEPCID) 20 MG tablet Take 1 tablet (20 mg total) by mouth 2 (two) times daily. 11/04/11 11/03/12  Sunnie Nielsen, MD    ALLERGIES:  No Known Allergies  SOCIAL HISTORY:  History  Substance Use Topics  . Smoking status: Current Every Day Smoker  . Smokeless tobacco: Not on file  . Alcohol Use: No    FAMILY HISTORY: No family history on file.  EXAM: BP 250/84  Pulse 71  Temp(Src) 98.4 F (36.9 C) (Oral)  Resp 20  SpO2 100% CONSTITUTIONAL: Alert and oriented and responds  appropriately to questions. Well-appearing; well-nourished HEAD: Normocephalic EYES: Conjunctivae clear, PERRL ENT: normal nose; no rhinorrhea; moist mucous membranes; pharynx without lesions noted, patient has multiple dental caries, loose upper posterior molar with no obvious sign of abscess, no facial swelling, TMs are clear bilaterally NECK: Supple, no meningismus, no LAD  CARD: RRR; S1 and S2 appreciated; no murmurs, no clicks, no rubs, no gallops RESP: Normal chest excursion without splinting or tachypnea; breath sounds clear and equal bilaterally; no wheezes, no rhonchi, no rales,  ABD/GI: Normal bowel sounds; non-distended; soft, non-tender, no rebound, no guarding BACK:  The back appears normal and is non-tender to palpation, there is no CVA tenderness EXT: Normal ROM in all joints; non-tender to palpation; no edema; normal capillary refill; no cyanosis    SKIN: Normal color for age and race; warm NEURO: Moves all extremities equally PSYCH: The patient's mood and manner are appropriate. Grooming and personal hygiene are appropriate.  MEDICAL DECISION MAKING: Patient with a loose right upper tooth without sign of obvious infection. Discussed with patient that she will need to follow with a dentist to have this tooth pulled. We'll give pain medication and sent home on prophylactic penicillin. Suspect her hypertension is secondary to pain and medical noncompliance. She reports she was on hydrochlorothiazide and Coreg in the past. We'll give her these medications in the ED as well as pain medication reassess her blood pressure. Her urine shows no hematuria but proteinuria that has been stable.. Her creatinine is normal. No current signs  of endorgan damage. Patient reports that she would like discharge home with outpatient followup.  ED PROGRESS: Patient's blood pressure is improved she is still having headache and some mild pain. We'll give clonidine and reassess.  Patient's hypertension has  improved but she still has a blood pressure of 210/70. Her headache is improved. Patient refuses to stay in the emergency Department for continued blood pressure control. She understands her risk of leaving the hospital at this point. She understands there is risk of heart attack, stroke, intracranial hemorrhage. She is able to verbalize these risks back to me. We'll discharge home with outpatient followup and restart patient's hydrochlorothiazide and Coreg. Have given strict return precautions. Patient verbalizes understanding is comfortable plan.  Brandy Maw Amen Staszak, DO 02/08/13 1840

## 2013-02-09 NOTE — Progress Notes (Signed)
Received call from [facility] pharmacy at [phone] regarding patient's discharge prescription of Penicillin 500mg . Due to cost, requesting to change dosage from 500mg  to 250 mg with patient taking 2 pills each time. Request given to Dr. Juleen China who gave permission for change. Message relayed to pharmacist. No further needs identified.

## 2013-08-16 DIAGNOSIS — R071 Chest pain on breathing: Secondary | ICD-10-CM | POA: Insufficient documentation

## 2013-08-16 DIAGNOSIS — F17200 Nicotine dependence, unspecified, uncomplicated: Secondary | ICD-10-CM | POA: Insufficient documentation

## 2013-08-16 DIAGNOSIS — I10 Essential (primary) hypertension: Secondary | ICD-10-CM | POA: Insufficient documentation

## 2013-08-17 ENCOUNTER — Emergency Department (HOSPITAL_COMMUNITY): Payer: Self-pay

## 2013-08-17 ENCOUNTER — Encounter (HOSPITAL_COMMUNITY): Payer: Self-pay | Admitting: Emergency Medicine

## 2013-08-17 ENCOUNTER — Emergency Department (HOSPITAL_COMMUNITY)
Admission: EM | Admit: 2013-08-17 | Discharge: 2013-08-17 | Disposition: A | Discharge status: Home / Self Care | Payer: Self-pay | Attending: Emergency Medicine | Admitting: Emergency Medicine

## 2013-08-17 DIAGNOSIS — R0789 Other chest pain: Secondary | ICD-10-CM

## 2013-08-17 LAB — PRO B NATRIURETIC PEPTIDE: PRO B NATRI PEPTIDE: 505 pg/mL — AB (ref 0–125)

## 2013-08-17 LAB — BASIC METABOLIC PANEL
BUN: 18 mg/dL (ref 6–23)
CO2: 25 meq/L (ref 19–32)
Calcium: 8.7 mg/dL (ref 8.4–10.5)
Chloride: 104 mEq/L (ref 96–112)
Creatinine, Ser: 0.79 mg/dL (ref 0.50–1.10)
GFR calc Af Amer: >90 mL/min (ref >90)
GFR calc non Af Amer: >90 mL/min (ref >90)
Glucose, Bld: 91 mg/dL (ref 70–99)
Potassium: 3.4 mEq/L — ABNORMAL LOW (ref 3.7–5.3)
SODIUM: 142 meq/L (ref 137–147)

## 2013-08-17 LAB — CBC
HCT: 34.9 % — ABNORMAL LOW (ref 36.0–46.0)
Hemoglobin: 11.9 g/dL — ABNORMAL LOW (ref 12.0–15.0)
MCH: 28.7 pg (ref 26.0–34.0)
MCHC: 34.1 g/dL (ref 30.0–36.0)
MCV: 84.1 fL (ref 78.0–100.0)
Platelets: 213 10*3/uL (ref 150–400)
RBC: 4.15 MIL/uL (ref 3.87–5.11)
RDW: 14.4 % (ref 11.5–15.5)
WBC: 6.9 10*3/uL (ref 4.0–10.5)

## 2013-08-17 LAB — I-STAT TROPONIN, ED: TROPONIN I, POC: 0.02 ng/mL (ref 0.00–0.08)

## 2013-08-17 MED ORDER — OXYCODONE-ACETAMINOPHEN 5-325 MG PO TABS
2.0000 | ORAL_TABLET | Freq: Once | ORAL | Status: AC
  Administered 2013-08-17: 2 via ORAL
  Filled 2013-08-17: qty 2

## 2013-08-17 MED ORDER — OXYCODONE-ACETAMINOPHEN 5-325 MG PO TABS
2.0000 | ORAL_TABLET | Freq: Every evening | ORAL | Status: DC | PRN
Start: 2013-08-17 — End: 2014-10-18

## 2013-08-17 NOTE — ED Notes (Signed)
Pt is discharged with son, who will drive home.

## 2013-08-17 NOTE — Discharge Instructions (Signed)
Your caregiver has diagnosed you as having chest pain that is not specific for one problem, but does not require admission.  You are at low risk for an acute heart condition or other serious illness. Chest pain comes from many different causes.  °SEEK IMMEDIATE MEDICAL ATTENTION IF: °You have severe chest pain, especially if the pain is crushing or pressure-like and spreads to the arms, back, neck, or jaw, or if you have sweating, nausea (feeling sick to your stomach), or shortness of breath. THIS IS AN EMERGENCY. Don't wait to see if the pain will go away. Get medical help at once. Call 911 or 0 (operator). DO NOT drive yourself to the hospital.  °Your chest pain gets worse and does not go away with rest.  °You have an attack of chest pain lasting longer than usual, despite rest and treatment with the medications your caregiver has prescribed.  °You wake from sleep with chest pain or shortness of breath.  °You feel dizzy or faint.  °You have chest pain not typical of your usual pain for which you originally saw your caregiver. ° °You have been diagnosed by your caregiver as having chest wall pain. °SEEK IMMEDIATE MEDICAL ATTENTION IF: °You develop a fever.  °Your chest pains become severe or intolerable.  °You develop new, unexplained symptoms (problems).  °You develop shortness of breath, nausea, vomiting, sweating or feel light headed.  °You develop a new cough or you cough up blood. ° °

## 2013-08-17 NOTE — ED Provider Notes (Signed)
CSN: [id number]     Arrival date & time 08/16/13  2350 History   First MD Initiated Contact with Patient 08/17/13 0148     Chief Complaint  Patient presents with  . Chest Pain     (Consider location/radiation/quality/duration/timing/severity/associated sxs/prior Treatment) HPI 43 year old female with 2-3 days constant gradual onset right-sided chest discomfort positional but nonexertional nonpleuritic, without cough without fever without trauma without immobilization without abdominal pain without radiation without associated symptoms and with no treatment prior to arrival; pain ranges from mild to moderate severity worse with palpation and position changes. PERC negative. Past Medical History  Diagnosis Date  . Hypertension    Past Surgical History  Procedure Laterality Date  . Cesarean section    . Tonsillectomy    . Bone cyst excision     No family history on file. History  Substance Use Topics  . Smoking status: Current Every Day Smoker  . Smokeless tobacco: Not on file  . Alcohol Use: No   OB History   Grav Para Term Preterm Abortions TAB SAB Ect Mult Living                 Review of Systems 10 Systems reviewed and are negative for acute change except as noted in the HPI.   Allergies  Review of patient's allergies indicates no known allergies.  Home Medications   Current Outpatient Rx  Name  Route  Sig  Dispense  Refill  . aspirin 81 MG chewable tablet   Oral   Chew 81 mg by mouth once.         [name] oxyCODONE-acetaminophen (PERCOCET) 5-325 MG per tablet   Oral   Take 2 tablets by mouth at bedtime as needed for severe pain.   6 tablet   0    BP 188/58  Pulse 72  Temp(Src) 98.8 F (37.1 C) (Oral)  Resp 23  Ht 5\' 6"  (1.676 m)  Wt 202 lb (91.627 kg)  BMI 32.62 kg/m2  SpO2 100%  LMP 08/09/2013 Physical Exam  Nursing note and vitals reviewed. Constitutional:  Awake, alert, nontoxic appearance.  HENT:  Head: Atraumatic.  Eyes: Right eye exhibits no  discharge. Left eye exhibits no discharge.  Neck: Neck supple.  Cardiovascular: Normal rate and regular rhythm.   No murmur heard. Pulmonary/Chest: Effort normal and breath sounds normal. No respiratory distress. She has no wheezes. She has no rales. She exhibits tenderness.  Reproducible right anterior chest wall tenderness without rash without deformity noted  Abdominal: Soft. There is no tenderness. There is no rebound.  Musculoskeletal: She exhibits no edema and no tenderness.  Baseline ROM, no obvious new focal weakness.  Neurological: She is alert.  Mental status and motor strength appears baseline for patient and situation.  Skin: No rash noted.  Psychiatric: She has a normal mood and affect.    ED Course  Procedures (including critical care time) Patient informed of clinical course, understand medical decision-making process, and agree with plan. Labs Review Labs Reviewed  CBC - Abnormal; Notable for the following:    Hemoglobin 11.9 (*)    HCT 34.9 (*)    All other components within normal limits  BASIC METABOLIC PANEL - Abnormal; Notable for the following:    Potassium 3.4 (*)    All other components within normal limits  PRO B NATRIURETIC PEPTIDE - Abnormal; Notable for the following:    Pro B Natriuretic peptide (BNP) 505.0 (*)    All other components within normal limits  I-STAT  Cope, ED   Imaging Review No results found.  ECG: Muse not working: Normal sinus rhythm, ventricular rate 82, normal axis, left ventricular hypertrophy, no acute ischemic changes noted, no comparison ECG available  MDM   Final diagnoses:  Chest wall pain    I doubt any other EMC precluding discharge at this time including, but not necessarily limited to the following:ACS, PE.    Babette Relic, MD 08/19/13 [phone]

## 2013-08-17 NOTE — ED Notes (Signed)
Pt. reports intermittent mid chest pain with slight SOB for 2 days , denies cough , nausea or diaphoresis .

## 2014-10-06 ENCOUNTER — Emergency Department (HOSPITAL_COMMUNITY): Payer: No Typology Code available for payment source

## 2014-10-06 ENCOUNTER — Inpatient Hospital Stay (HOSPITAL_COMMUNITY)
Admission: EM | Admit: 2014-10-06 | Discharge: 2014-10-10 | DRG: 065 | Disposition: A | Discharge status: Rehabilitation Facility | Payer: No Typology Code available for payment source | Attending: Internal Medicine | Admitting: Internal Medicine

## 2014-10-06 ENCOUNTER — Encounter (HOSPITAL_COMMUNITY): Payer: Self-pay | Admitting: Cardiology

## 2014-10-06 DIAGNOSIS — I351 Nonrheumatic aortic (valve) insufficiency: Secondary | ICD-10-CM | POA: Diagnosis present

## 2014-10-06 DIAGNOSIS — I119 Hypertensive heart disease without heart failure: Secondary | ICD-10-CM

## 2014-10-06 DIAGNOSIS — I739 Peripheral vascular disease, unspecified: Secondary | ICD-10-CM | POA: Diagnosis present

## 2014-10-06 DIAGNOSIS — R531 Weakness: Secondary | ICD-10-CM

## 2014-10-06 DIAGNOSIS — F1910 Other psychoactive substance abuse, uncomplicated: Secondary | ICD-10-CM | POA: Diagnosis present

## 2014-10-06 DIAGNOSIS — I69354 Hemiplegia and hemiparesis following cerebral infarction affecting left non-dominant side: Secondary | ICD-10-CM | POA: Diagnosis present

## 2014-10-06 DIAGNOSIS — I639 Cerebral infarction, unspecified: Secondary | ICD-10-CM

## 2014-10-06 DIAGNOSIS — Z716 Tobacco abuse counseling: Secondary | ICD-10-CM | POA: Diagnosis present

## 2014-10-06 DIAGNOSIS — M79605 Pain in left leg: Secondary | ICD-10-CM | POA: Diagnosis present

## 2014-10-06 DIAGNOSIS — M79606 Pain in leg, unspecified: Secondary | ICD-10-CM | POA: Diagnosis present

## 2014-10-06 DIAGNOSIS — Z6831 Body mass index (BMI) 31.0-31.9, adult: Secondary | ICD-10-CM | POA: Diagnosis not present

## 2014-10-06 DIAGNOSIS — I110 Hypertensive heart disease with heart failure: Secondary | ICD-10-CM | POA: Diagnosis present

## 2014-10-06 DIAGNOSIS — I6789 Other cerebrovascular disease: Secondary | ICD-10-CM | POA: Diagnosis not present

## 2014-10-06 DIAGNOSIS — F4320 Adjustment disorder, unspecified: Secondary | ICD-10-CM | POA: Diagnosis present

## 2014-10-06 DIAGNOSIS — I63411 Cerebral infarction due to embolism of right middle cerebral artery: Secondary | ICD-10-CM | POA: Diagnosis present

## 2014-10-06 DIAGNOSIS — Z6829 Body mass index (BMI) 29.0-29.9, adult: Secondary | ICD-10-CM | POA: Diagnosis not present

## 2014-10-06 DIAGNOSIS — F17210 Nicotine dependence, cigarettes, uncomplicated: Secondary | ICD-10-CM | POA: Diagnosis present

## 2014-10-06 DIAGNOSIS — R262 Difficulty in walking, not elsewhere classified: Secondary | ICD-10-CM | POA: Diagnosis present

## 2014-10-06 DIAGNOSIS — I330 Acute and subacute infective endocarditis: Secondary | ICD-10-CM | POA: Insufficient documentation

## 2014-10-06 DIAGNOSIS — I429 Cardiomyopathy, unspecified: Secondary | ICD-10-CM | POA: Diagnosis present

## 2014-10-06 DIAGNOSIS — I63431 Cerebral infarction due to embolism of right posterior cerebral artery: Secondary | ICD-10-CM | POA: Diagnosis present

## 2014-10-06 DIAGNOSIS — I10 Essential (primary) hypertension: Secondary | ICD-10-CM | POA: Insufficient documentation

## 2014-10-06 DIAGNOSIS — I5042 Chronic combined systolic (congestive) and diastolic (congestive) heart failure: Secondary | ICD-10-CM | POA: Diagnosis present

## 2014-10-06 DIAGNOSIS — F17200 Nicotine dependence, unspecified, uncomplicated: Secondary | ICD-10-CM

## 2014-10-06 DIAGNOSIS — E669 Obesity, unspecified: Secondary | ICD-10-CM | POA: Diagnosis present

## 2014-10-06 DIAGNOSIS — R011 Cardiac murmur, unspecified: Secondary | ICD-10-CM | POA: Diagnosis present

## 2014-10-06 DIAGNOSIS — Z8673 Personal history of transient ischemic attack (TIA), and cerebral infarction without residual deficits: Secondary | ICD-10-CM

## 2014-10-06 DIAGNOSIS — Z7982 Long term (current) use of aspirin: Secondary | ICD-10-CM

## 2014-10-06 DIAGNOSIS — M79609 Pain in unspecified limb: Secondary | ICD-10-CM | POA: Diagnosis not present

## 2014-10-06 DIAGNOSIS — I63211 Cerebral infarction due to unspecified occlusion or stenosis of right vertebral arteries: Secondary | ICD-10-CM | POA: Diagnosis not present

## 2014-10-06 DIAGNOSIS — E785 Hyperlipidemia, unspecified: Secondary | ICD-10-CM | POA: Diagnosis present

## 2014-10-06 HISTORY — DX: Cerebral infarction, unspecified: I63.9

## 2014-10-06 LAB — I-STAT TROPONIN, ED: TROPONIN I, POC: 0.03 ng/mL (ref 0.00–0.08)

## 2014-10-06 LAB — CBC
HEMATOCRIT: 43.5 % (ref 36.0–46.0)
Hemoglobin: 14.3 g/dL (ref 12.0–15.0)
MCH: 29.1 pg (ref 26.0–34.0)
MCHC: 32.9 g/dL (ref 30.0–36.0)
MCV: 88.6 fL (ref 78.0–100.0)
Platelets: 180 10*3/uL (ref 150–400)
RBC: 4.91 MIL/uL (ref 3.87–5.11)
RDW: 14.5 % (ref 11.5–15.5)
WBC: 5.6 10*3/uL (ref 4.0–10.5)

## 2014-10-06 LAB — ETHANOL: Alcohol, Ethyl (B): <5 mg/dL (ref <5)

## 2014-10-06 LAB — URINE MICROSCOPIC-ADD ON

## 2014-10-06 LAB — PROTIME-INR
INR: 1.08 (ref 0.00–1.49)
Prothrombin Time: 14.1 seconds (ref 11.6–15.2)

## 2014-10-06 LAB — COMPREHENSIVE METABOLIC PANEL
ALT: 19 U/L (ref 14–54)
AST: 27 U/L (ref 15–41)
Albumin: 3.8 g/dL (ref 3.5–5.0)
Alkaline Phosphatase: 68 U/L (ref 38–126)
Anion gap: 9 (ref 5–15)
BUN: 17 mg/dL (ref 6–20)
CALCIUM: 9.3 mg/dL (ref 8.9–10.3)
CO2: 26 mmol/L (ref 22–32)
CREATININE: 1.02 mg/dL — AB (ref 0.44–1.00)
Chloride: 105 mmol/L (ref 101–111)
GFR calc Af Amer: >60 mL/min (ref >60)
Glucose, Bld: 106 mg/dL — ABNORMAL HIGH (ref 70–99)
Potassium: 3.6 mmol/L (ref 3.5–5.1)
Sodium: 140 mmol/L (ref 135–145)
Total Bilirubin: 0.6 mg/dL (ref 0.3–1.2)
Total Protein: 7.6 g/dL (ref 6.5–8.1)

## 2014-10-06 LAB — URINALYSIS, ROUTINE W REFLEX MICROSCOPIC
Bilirubin Urine: NEGATIVE
Glucose, UA: NEGATIVE mg/dL
Ketones, ur: NEGATIVE mg/dL
Leukocytes, UA: NEGATIVE
NITRITE: NEGATIVE
Protein, ur: 100 mg/dL — AB
SPECIFIC GRAVITY, URINE: 1.01 (ref 1.005–1.030)
UROBILINOGEN UA: 0.2 mg/dL (ref 0.0–1.0)
pH: 7 (ref 5.0–8.0)

## 2014-10-06 LAB — I-STAT CHEM 8, ED
BUN: 24 mg/dL — AB (ref 6–20)
CHLORIDE: 102 mmol/L (ref 101–111)
Calcium, Ion: 1.15 mmol/L (ref 1.12–1.23)
Creatinine, Ser: 0.9 mg/dL (ref 0.44–1.00)
GLUCOSE: 107 mg/dL — AB (ref 70–99)
HCT: 48 % — ABNORMAL HIGH (ref 36.0–46.0)
HEMOGLOBIN: 16.3 g/dL — AB (ref 12.0–15.0)
Potassium: 3.8 mmol/L (ref 3.5–5.1)
Sodium: 142 mmol/L (ref 135–145)
TCO2: 25 mmol/L (ref 0–100)

## 2014-10-06 LAB — RAPID URINE DRUG SCREEN, HOSP PERFORMED
Amphetamines: NOT DETECTED
BARBITURATES: NOT DETECTED
Benzodiazepines: NOT DETECTED
COCAINE: NOT DETECTED
Opiates: POSITIVE — AB
TETRAHYDROCANNABINOL: POSITIVE — AB

## 2014-10-06 LAB — DIFFERENTIAL
BASOS ABS: 0 10*3/uL (ref 0.0–0.1)
Basophils Relative: 1 % (ref 0–1)
Eosinophils Absolute: 0.1 10*3/uL (ref 0.0–0.7)
Eosinophils Relative: 3 % (ref 0–5)
LYMPHS PCT: 44 % (ref 12–46)
Lymphs Abs: 2.5 10*3/uL (ref 0.7–4.0)
Monocytes Absolute: 0.6 10*3/uL (ref 0.1–1.0)
Monocytes Relative: 11 % (ref 3–12)
NEUTROS ABS: 2.3 10*3/uL (ref 1.7–7.7)
NEUTROS PCT: 41 % — AB (ref 43–77)

## 2014-10-06 LAB — APTT: APTT: 34 s (ref 24–37)

## 2014-10-06 MED ORDER — LORAZEPAM 2 MG/ML IJ SOLN
2.0000 mg | Freq: Once | INTRAMUSCULAR | Status: AC
  Administered 2014-10-06: 2 mg via INTRAVENOUS
  Filled 2014-10-06: qty 1

## 2014-10-06 MED ORDER — CARVEDILOL 3.125 MG PO TABS
3.1250 mg | ORAL_TABLET | Freq: Once | ORAL | Status: AC
  Administered 2014-10-06: 3.125 mg via ORAL
  Filled 2014-10-06: qty 1

## 2014-10-06 MED ORDER — ASPIRIN 300 MG RE SUPP: 300.0000 mg | Freq: Every day | RECTAL | Status: DC

## 2014-10-06 MED ORDER — HEPARIN SODIUM (PORCINE) 5000 UNIT/ML IJ SOLN
5000.0000 [IU] | Freq: Three times a day (TID) | INTRAMUSCULAR | Status: DC
  Administered 2014-10-07 – 2014-10-10 (×12): 5000 [IU] via SUBCUTANEOUS
  Filled 2014-10-06 (×11): qty 1

## 2014-10-06 MED ORDER — STROKE: EARLY STAGES OF RECOVERY BOOK
Freq: Once | Status: AC
  Administered 2014-10-07: 01:00:00
  Filled 2014-10-06: qty 1

## 2014-10-06 MED ORDER — ASPIRIN EC 325 MG PO TBEC: 325.0000 mg | DELAYED_RELEASE_TABLET | Freq: Every day | ORAL | Status: DC

## 2014-10-06 MED ORDER — LORAZEPAM 2 MG/ML IJ SOLN
1.0000 mg | Freq: Once | INTRAMUSCULAR | Status: AC
  Administered 2014-10-06: 1 mg via INTRAVENOUS
  Filled 2014-10-06: qty 1

## 2014-10-06 MED ORDER — HYDROCHLOROTHIAZIDE 12.5 MG PO CAPS
12.5000 mg | ORAL_CAPSULE | Freq: Once | ORAL | Status: AC
  Administered 2014-10-06: 12.5 mg via ORAL
  Filled 2014-10-06: qty 1

## 2014-10-06 MED ORDER — ASPIRIN 81 MG PO CHEW
324.0000 mg | CHEWABLE_TABLET | Freq: Once | ORAL | Status: AC
  Administered 2014-10-06: 324 mg via ORAL
  Filled 2014-10-06: qty 4

## 2014-10-06 MED ORDER — ASPIRIN 325 MG PO TABS
325.0000 mg | ORAL_TABLET | Freq: Every day | ORAL | Status: DC
  Administered 2014-10-07: 325 mg via ORAL
  Filled 2014-10-06: qty 1

## 2014-10-06 MED ORDER — HYDRALAZINE HCL 20 MG/ML IJ SOLN: 10.0000 mg | INTRAMUSCULAR | Status: DC | PRN

## 2014-10-06 MED ORDER — IBUPROFEN 400 MG PO TABS
400.0000 mg | ORAL_TABLET | Freq: Once | ORAL | Status: AC
  Administered 2014-10-06: 400 mg via ORAL
  Filled 2014-10-06: qty 1

## 2014-10-06 MED ORDER — HYDRALAZINE HCL 20 MG/ML IJ SOLN
10.0000 mg | INTRAMUSCULAR | Status: DC | PRN
  Administered 2014-10-07 – 2014-10-08 (×3): 20 mg via INTRAVENOUS
  Filled 2014-10-06 (×3): qty 1

## 2014-10-06 NOTE — ED Notes (Signed)
Pt is frustrated with inability to eat and drink and wait.  This RN attempted to explain reasons for delay and explain, again, reasons for not being able to eat and drink.   Pt is requesting MD presence at this time.

## 2014-10-06 NOTE — ED Notes (Signed)
This RN placed second IV.  Called MRI, and pt is back in line for scan.  MRI will call when pt sent for pickup so RN can administer Ativan.

## 2014-10-06 NOTE — ED Notes (Signed)
Patient's family brought food for patient. When told not to eat patient began eating with disregard to instructions.

## 2014-10-06 NOTE — ED Notes (Signed)
Provider at the bedside.  

## 2014-10-06 NOTE — ED Notes (Signed)
Pt's family asked if pt could eat.  This RN explained to three family members present reasoning behind pt being unable to eat at this time.  This RN told family she would let them know as soon as the pt is cleared to eat by EDP.  Family verbalized understanding.

## 2014-10-06 NOTE — ED Notes (Signed)
MRI sts they are on the way.  Will administer Ativan at this time.

## 2014-10-06 NOTE — ED Notes (Signed)
Pt reports this morning she got up to go to the bathroom and had pain in the left leg. Reports she has been tripping over things. Report increased pain with standing and walking.

## 2014-10-06 NOTE — ED Notes (Signed)
Dr. Rogers Blocker, MD at bedside at this time

## 2014-10-06 NOTE — ED Provider Notes (Signed)
CSN: [id number]     Arrival date & time 10/06/14  1016 History   First MD Initiated Contact with Patient 10/06/14 1041     Chief Complaint  Patient presents with  . Leg Pain     (Consider location/radiation/quality/duration/timing/severity/associated sxs/prior Treatment) HPI Comments: Pt comes in with c/o left leg pain and stumbling this morning. Pt states that she was not able to walk straight since she got up this morning. Denies visual changes. She states that she feels like she can't lift her left leg. She states that she may have some numbness in her left leg. She states that she went to be normal last night  The history is provided by the patient. No language interpreter was used.    Past Medical History  Diagnosis Date  . Hypertension    Past Surgical History  Procedure Laterality Date  . Cesarean section    . Tonsillectomy    . Bone cyst excision     History reviewed. No pertinent family history. History  Substance Use Topics  . Smoking status: Current Every Day Smoker  . Smokeless tobacco: Not on file  . Alcohol Use: No   OB History    No data available     Review of Systems  All other systems reviewed and are negative.     Allergies  Review of patient's allergies indicates no known allergies.  Home Medications   Prior to Admission medications   Medication Sig Start Date End Date Taking? Authorizing Provider  oxyCODONE-acetaminophen (PERCOCET) 5-325 MG per tablet Take 2 tablets by mouth at bedtime as needed for severe pain. Patient not taking: Reported on 10/06/2014 08/17/13   Riki Altes, MD   BP 206/87 mmHg  Pulse 75  Temp(Src) 98.7 F (37.1 C) (Oral)  Resp 18  Wt 202 lb (91.627 kg)  SpO2 100%  LMP 10/03/2014 Physical Exam  Constitutional: She is oriented to person, place, and time. She appears well-developed and well-nourished.  HENT:  Head: Normocephalic and atraumatic.  Eyes: Conjunctivae and EOM are normal. Pupils are equal, round, and  reactive to light.  Cardiovascular: Normal rate and regular rhythm.   Pulmonary/Chest: Effort normal and breath sounds normal.  Abdominal: Soft. Bowel sounds are normal. There is no tenderness.  Musculoskeletal: Normal range of motion.  Neurological: She is alert and oriented to person, place, and time.  Pt drifts to the left when standing. Drags foot when walking, able to lift leg off the bed. Slight less equal grips on the left side  Skin: Skin is warm and dry.  Psychiatric: She has a normal mood and affect.  Nursing note and vitals reviewed.   ED Course  Procedures (including critical care time) Labs Review Labs Reviewed  DIFFERENTIAL - Abnormal; Notable for the following:    Neutrophils Relative % 41 (*)    All other components within normal limits  COMPREHENSIVE METABOLIC PANEL - Abnormal; Notable for the following:    Glucose, Bld 106 (*)    Creatinine, Ser 1.02 (*)    All other components within normal limits  URINE RAPID DRUG SCREEN (HOSP PERFORMED) - Abnormal; Notable for the following:    Opiates POSITIVE (*)    Tetrahydrocannabinol POSITIVE (*)    All other components within normal limits  URINALYSIS, ROUTINE W REFLEX MICROSCOPIC - Abnormal; Notable for the following:    Hgb urine dipstick TRACE (*)    Protein, ur 100 (*)    All other components within normal limits  URINE MICROSCOPIC-ADD ON -  Abnormal; Notable for the following:    Squamous Epithelial / LPF FEW (*)    Bacteria, UA FEW (*)    All other components within normal limits  I-STAT CHEM 8, ED - Abnormal; Notable for the following:    BUN 24 (*)    Glucose, Bld 107 (*)    Hemoglobin 16.3 (*)    HCT 48.0 (*)    All other components within normal limits  ETHANOL  PROTIME-INR  APTT  CBC  I-STAT TROPOININ, ED    Imaging Review Ct Head Wo Contrast  10/06/2014   CLINICAL DATA:  Leg pain, left-sided weakness  EXAM: CT HEAD WITHOUT CONTRAST  TECHNIQUE: Contiguous axial images were obtained from the base of  the skull through the vertex without intravenous contrast.  COMPARISON:  12/26/2008  FINDINGS: No skull fracture is noted. Paranasal sinuses and mastoid air cells are unremarkable. No intracranial hemorrhage, mass effect or midline shift. No acute cortical infarction. No mass lesion is noted on this unenhanced scan. Ventricular size is stable from prior exam. No intra or extra-axial fluid collection. The gray and white-matter differentiation is preserved.  IMPRESSION: No acute intracranial abnormality. No definite acute cortical infarction.   Electronically Signed   By: Lahoma Crocker M.D.   On: 10/06/2014 13:18     EKG Interpretation   Date/Time:  Friday Oct 06 2014 11:43:53 EDT Ventricular Rate:  83 PR Interval:  158 QRS Duration: 101 QT Interval:  437 QTC Calculation: 513 R Axis:   20 Text Interpretation:  Sinus rhythm Biatrial enlargement LVH with secondary  repolarization abnormality Anterior ST elevation, probably due to LVH  Prolonged QT interval Non-specific abnormality, ST segment, and/or T-wave  Confirmed by Kathrynn Humble, MD, Thelma Comp [id number]) on 10/06/2014 2:57:38 PM      MDM   Final diagnoses:  Essential hypertension  Weakness    Mri pending to r/o stroke. Pt is being follow by DR. Gwenyth Bouillon, NP 10/06/14 1648  Varney Biles, MD 10/06/14 1818

## 2014-10-06 NOTE — Discharge Instructions (Signed)
If you smoke, stop smoking immediately. Take aspirin daily as directed. Please return to this emergency department or any other emergency department if you would like to seek treatment for your stroke.  Please follow up with your primary care provider.

## 2014-10-06 NOTE — ED Provider Notes (Signed)
16:47: assumed care from previous ED team, see their note for details of initial workup.  Briefly, pt is a 44 yo F with PMH of uncontrolled HTN presenting with L leg pain, numbness, walking to one side. Awoke this morning with symptoms. H/o HTN, not taking meds and hypertensive here.  Variable exam with some weakness of LLE with standing but able to lift off bed.  Weaker compared to RLE. CT head normal.  MR brain pending.  Initially refused MR, given ativan.    Pt persistently hypertensive- reports previously taking Coreg and HCTZ- will administer these.  MR brain obtained showing multiple small R-sided lacunar infarcts, as well as multiple chronic appearing lacunar infarcts- concerning for embolic disease.  Discussed admission for further workup with pt.  She initially refused admission and requested leaving AMA despite multiple discussions regarding risk of death, paralysis, severe morbidity by leaving without workup.  Pt still requested leaving, however upon arrival of her son and discussion with him she agreed to stay for admission.  Pt admitted to Hospitalist service for further management.  No other acute events during my care.  Discussed with attending Dr. Winfred Leeds.  Ellwood Dense, MD 10/07/14 [place] Grant, MD 10/08/14 [phone]

## 2014-10-06 NOTE — ED Provider Notes (Signed)
I've discussed the risk of patient going home without complete stroke evaluation. She is aware that she has a stroke on today's MRI scan Including death and being paralyzed. Patient is of sound mind to refuse admission and care.  Orlie Dakin, MD 10/06/14 2142

## 2014-10-06 NOTE — ED Notes (Signed)
Spoke to pt. About plan of care and need/purpose of MRI.  Pt still requesting to eat her pancakes at bedside.  Pt also requesting to "go outside for a cigarette".  This RN explain to pt reasoning behind pt not eating or smoking.  Pt verbalized understanding.  Pt agreed to attempt 2mg  of Ativan.    MRI contacted.  Will call this RN before pickup so RN can administer Ativan.

## 2014-10-06 NOTE — ED Notes (Signed)
MRI called, sts patient is "screaming and wiggling" inside MRI machine.  EDP aware.  Additional Ativan ordered.

## 2014-10-06 NOTE — ED Notes (Signed)
Patient's son at bedside spoke with two nurses regarding patient leaving AMA. Patient stated will stay the night and be admitted. Doctor notified.

## 2014-10-06 NOTE — ED Notes (Addendum)
This RN attempted to give Ativan through IV.  Pt c/o pain and burning during pre-flush and refused to let RN attempt administration.

## 2014-10-06 NOTE — ED Notes (Signed)
Dr. Kathrynn Humble made aware of pt's pain and situation with IV placement/medication/MRI wait time.

## 2014-10-06 NOTE — ED Notes (Signed)
Spoke to MRI, will be one way shortly for pt.  Will provide medications as ordered.

## 2014-10-06 NOTE — ED Notes (Signed)
Doctor at bedside.

## 2014-10-06 NOTE — ED Notes (Signed)
Pt stating she wants to leave AMA. Dr. Aram Beecham at bedside

## 2014-10-06 NOTE — H&P (Addendum)
[facility] History and Physical  Brandy Roberts UEA:[id number] DOB: [birth year] DOA: 10/06/2014  Referring physician: EDP PCP: Default, Provider, MD   Chief Complaint: Left leg weakness   HPI: Brandy Roberts is a 44 y.o. female who presents to the ED with c/o leg pain and stumbling since she got up this morning.  Patient is unable to walk straight since that time.  LKW was last night.  No visual changes, leg feels weak as well.  No history of similar symptoms.  Review of Systems: Denies any IVDU, Systems reviewed.  As above, otherwise negative  Past Medical History  Diagnosis Date  . Hypertension    Past Surgical History  Procedure Laterality Date  . Cesarean section    . Tonsillectomy    . Bone cyst excision     Social History:  reports that she has been smoking.  She does not have any smokeless tobacco history on file. She reports that she does not drink alcohol or use illicit drugs.  No Known Allergies  History reviewed. No pertinent family history.   Prior to Admission medications   Medication Sig Start Date End Date Taking? Authorizing Provider  aspirin EC 325 MG tablet Take 1 tablet (325 mg total) by mouth daily. 10/06/14 11/06/14  Ellwood Dense, MD  oxyCODONE-acetaminophen (PERCOCET) 5-325 MG per tablet Take 2 tablets by mouth at bedtime as needed for severe pain. Patient not taking: Reported on 10/06/2014 08/17/13   Riki Altes, MD   Physical Exam: Filed Vitals:   10/06/14 2300  BP: 196/88  Pulse: 70  Temp:   Resp: 16    BP 196/88 mmHg  Pulse 70  Temp(Src) 98.7 F (37.1 C) (Oral)  Resp 16  Wt 91.627 kg (202 lb)  SpO2 100%  LMP 10/03/2014  General Appearance:    Alert, oriented, no distress, appears stated age  Head:    Normocephalic, atraumatic  Eyes:    PERRL, EOMI, sclera non-icteric        Nose:   Nares without drainage or epistaxis. Mucosa, turbinates normal  Throat:   Moist mucous membranes. Oropharynx without erythema or exudate.  Neck:    Supple. No carotid bruits.  No thyromegaly.  No lymphadenopathy.   Back:     No CVA tenderness, no spinal tenderness  Lungs:     Clear to auscultation bilaterally, without wheezes, rhonchi or rales  Chest wall:    No tenderness to palpitation  Heart:    Regular rate and rhythm Does have 2/6 murmur  Abdomen:     Soft, non-tender, nondistended, normal bowel sounds, no organomegaly  Genitalia:    deferred  Rectal:    deferred  Extremities:   No clubbing, cyanosis or edema.  Pulses:   2+ and symmetric all extremities  Skin:   Skin color, texture, turgor normal, no rashes or lesions  Lymph nodes:   Cervical, supraclavicular, and axillary nodes normal  Neurologic:   Drifts to left when standing, drags foot when walking, able to lift leg off the bed, grip weaker on the left side.    Labs on Admission:  Basic Metabolic Panel:  Recent Labs Lab 10/06/14 1153 10/06/14 1207  NA 140 142  K 3.6 3.8  CL 105 102  CO2 26  --   GLUCOSE 106* 107*  BUN 17 24*  CREATININE 1.02* 0.90  CALCIUM 9.3  --    Liver Function Tests:  Recent Labs Lab 10/06/14 1153  AST 27  ALT 19  ALKPHOS 68  BILITOT 0.6  PROT 7.6  ALBUMIN 3.8   No results for input(s): LIPASE, AMYLASE in the last 168 hours. No results for input(s): AMMONIA in the last 168 hours. CBC:  Recent Labs Lab 10/06/14 1153 10/06/14 1207  WBC 5.6  --   NEUTROABS 2.3  --   HGB 14.3 16.3*  HCT 43.5 48.0*  MCV 88.6  --   PLT 180  --    Cardiac Enzymes: No results for input(s): CKTOTAL, CKMB, CKMBINDEX, TROPONINI in the last 168 hours.  BNP (last 3 results) No results for input(s): PROBNP in the last 8760 hours. CBG: No results for input(s): GLUCAP in the last 168 hours.  Radiological Exams on Admission: Ct Head Wo Contrast  10/06/2014   CLINICAL DATA:  Leg pain, left-sided weakness  EXAM: CT HEAD WITHOUT CONTRAST  TECHNIQUE: Contiguous axial images were obtained from the base of the skull through the vertex without  intravenous contrast.  COMPARISON:  12/26/2008  FINDINGS: No skull fracture is noted. Paranasal sinuses and mastoid air cells are unremarkable. No intracranial hemorrhage, mass effect or midline shift. No acute cortical infarction. No mass lesion is noted on this unenhanced scan. Ventricular size is stable from prior exam. No intra or extra-axial fluid collection. The gray and white-matter differentiation is preserved.  IMPRESSION: No acute intracranial abnormality. No definite acute cortical infarction.   Electronically Signed   By: Lahoma Crocker M.D.   On: 10/06/2014 13:18   Mr Brain Wo Contrast  10/06/2014   CLINICAL DATA:  44 year old female with left lower extremity pain and weakness. Initial encounter.  EXAM: MRI HEAD WITHOUT CONTRAST  TECHNIQUE: Multiplanar, multiecho pulse sequences of the brain and surrounding structures were obtained without intravenous contrast.  COMPARISON:  Head CT without contrast 1315 hours today.  FINDINGS: There is a 12 mm area of restricted diffusion in the lateral right thalamus near the posterior limb of the right internal capsule. Associated T2 and FLAIR hyperintensity, but no hemorrhage or mass effect.  Also in the right hemisphere at the corona radiata just above the low right lateral ventricle there is a small 6-8 mm focus of restricted diffusion. See series 5, image 18. This level also demonstrates mild T2 and FLAIR hyperintensity with no hemorrhage or mass effect.  In the left corona radiata there is signal abnormality most resembling a chronic lacunar infarct (series 9, image 18). Several other scattered bilateral cerebral white matter T2 and FLAIR hyperintense foci are nonspecific. In the left globus pallidus and anterior limb of the external capsule there are signal changes suggesting chronic lacunar infarcts. There is hemosiderin associated with the former.  The brainstem and cerebellum are within normal limits. Major intracranial vascular flow voids are within normal  limits.  No midline shift, mass effect, evidence of mass lesion, ventriculomegaly, extra-axial collection or acute intracranial hemorrhage. Cervicomedullary junction and pituitary are within normal limits. Negative visualized cervical spine. Visible internal auditory structures appear normal. Trace left mastoid fluid. Negative nasopharynx. Trace paranasal sinus mucosal thickening. Visualized orbit soft tissues are within normal limits. Visualized scalp soft tissues are within normal limits. Visualized bone marrow signal is within normal limits.  IMPRESSION: 1. Small acute lacunar infarcts in the lateral right thalamus/posterior limb right external capsule (right PCA territory) and also the right corona radiata (right MCA territory). No mass effect or hemorrhage. 2. Superimposed chronic lacunar infarcts in the left corona radiata and left basal ganglia (left MCA territory). 3. In light of the above in the patient's age, consider a source of cardiac  or paradoxical emboli, such as PFO. And otherwise consider risk factors for accelerated small vessel disease.   Electronically Signed   By: Genevie Ann M.D.   On: 10/06/2014 20:25    EKG: Independently reviewed.  Assessment/Plan Active Problems:   Stroke (cerebrum)   Embolic stroke involving right middle cerebral artery   Embolic stroke involving right posterior cerebral artery   1. Acute ischemic strokes involving the R MCA and R PCA circulation - with chronic lacunar infarcts in the L sided circulation. 1. This is very worrisome given the patients age for cardioembolic source 2. Neurology consulted 3. Stroke pathway 4. 2d echo ordered, neurology to decide if we should just skip this and go straight to the TEE that the patient will likely required. 5. ASA ordered, neurology to decide if we should switch to full dose anticoagulation (heparin gtt, etc) for embolic strokes. 6. Carotid dopplers ordered, though these are very low yield with bilateral and  posterior stroke symptoms (this appears to be cardioembolic given the multiple vascular distribution effected including posterior circulation), neurology to decide if we should even continue to obtain these or just DC them. 2. HTN - allowing permissive HTN in setting of acute stroke, treat if BP gets above 220.  PRN hydralazine ordered.    Code Status: Full Code  Family Communication: Family at bedside Disposition Plan: Admit to inpatient for work up as long as patient is willing to stay for, may leave AMA though but has been warned by multiple docs that this is a bad idea with acute stroke.   Time spent: 70 min  Estephan Gallardo M. [facility] Pager [phone]  If 7AM-7PM, please contact the day team taking care of the patient [email or web address] Password [email or web address] 10/06/2014, 11:11 PM

## 2014-10-06 NOTE — ED Notes (Signed)
Pt continues to ask this RN if she can eat.  This RN explained reasoning for not being able to eat.

## 2014-10-06 NOTE — ED Notes (Signed)
EDP made aware pt okay with attempted MRI again.

## 2014-10-06 NOTE — ED Notes (Signed)
MRI called.  Sts patient is refusing MRI, regardless of medication.  EDPs aware.  Will speak to pt upon return.

## 2014-10-07 ENCOUNTER — Inpatient Hospital Stay (HOSPITAL_COMMUNITY): Payer: No Typology Code available for payment source

## 2014-10-07 DIAGNOSIS — E785 Hyperlipidemia, unspecified: Secondary | ICD-10-CM | POA: Insufficient documentation

## 2014-10-07 DIAGNOSIS — I10 Essential (primary) hypertension: Secondary | ICD-10-CM | POA: Insufficient documentation

## 2014-10-07 DIAGNOSIS — Z720 Tobacco use: Secondary | ICD-10-CM

## 2014-10-07 DIAGNOSIS — I63311 Cerebral infarction due to thrombosis of right middle cerebral artery: Secondary | ICD-10-CM

## 2014-10-07 DIAGNOSIS — F1910 Other psychoactive substance abuse, uncomplicated: Secondary | ICD-10-CM

## 2014-10-07 DIAGNOSIS — F17200 Nicotine dependence, unspecified, uncomplicated: Secondary | ICD-10-CM

## 2014-10-07 DIAGNOSIS — Z8673 Personal history of transient ischemic attack (TIA), and cerebral infarction without residual deficits: Secondary | ICD-10-CM

## 2014-10-07 DIAGNOSIS — I6789 Other cerebrovascular disease: Secondary | ICD-10-CM

## 2014-10-07 LAB — LIPID PANEL
Cholesterol: 179 mg/dL (ref 0–200)
HDL: 45 mg/dL (ref >40)
LDL Cholesterol: 113 mg/dL — ABNORMAL HIGH (ref 0–99)
Total CHOL/HDL Ratio: 4 RATIO
Triglycerides: 105 mg/dL (ref <150)
VLDL: 21 mg/dL (ref 0–40)

## 2014-10-07 MED ORDER — IBUPROFEN 200 MG PO TABS
600.0000 mg | ORAL_TABLET | Freq: Four times a day (QID) | ORAL | Status: DC | PRN
  Administered 2014-10-07 – 2014-10-08 (×2): 600 mg via ORAL
  Filled 2014-10-07 (×2): qty 3

## 2014-10-07 MED ORDER — IOHEXOL 350 MG/ML SOLN
50.0000 mL | Freq: Once | INTRAVENOUS | Status: AC | PRN
  Administered 2014-10-07: 50 mL via INTRAVENOUS

## 2014-10-07 MED ORDER — CLOPIDOGREL BISULFATE 75 MG PO TABS
75.0000 mg | ORAL_TABLET | Freq: Every day | ORAL | Status: DC
  Administered 2014-10-08 – 2014-10-10 (×3): 75 mg via ORAL
  Filled 2014-10-07 (×3): qty 1

## 2014-10-07 MED ORDER — LISINOPRIL 20 MG PO TABS
20.0000 mg | ORAL_TABLET | Freq: Every day | ORAL | Status: DC
  Administered 2014-10-07 – 2014-10-08 (×2): 20 mg via ORAL
  Filled 2014-10-07 (×2): qty 1

## 2014-10-07 MED ORDER — ATORVASTATIN CALCIUM 10 MG PO TABS
10.0000 mg | ORAL_TABLET | Freq: Every day | ORAL | Status: DC
  Administered 2014-10-07 – 2014-10-09 (×3): 10 mg via ORAL
  Filled 2014-10-07 (×3): qty 1

## 2014-10-07 MED ORDER — ASPIRIN EC 81 MG PO TBEC
81.0000 mg | DELAYED_RELEASE_TABLET | Freq: Every day | ORAL | Status: DC
  Administered 2014-10-08 – 2014-10-10 (×3): 81 mg via ORAL
  Filled 2014-10-07 (×3): qty 1

## 2014-10-07 MED ORDER — ASPIRIN 325 MG PO TABS
ORAL_TABLET | ORAL | Status: AC
  Filled 2014-10-07: qty 1

## 2014-10-07 NOTE — Progress Notes (Signed)
Pt admitted from ED with stroke diagnosis,alert and oriented, c/o of pain in left leg,pt settled in bed, telemetry monitor  put on pt, will however continue to monitor, Brandy Roberts, [name]

## 2014-10-07 NOTE — Progress Notes (Signed)
STROKE TEAM PROGRESS NOTE   SUBJECTIVE (INTERVAL HISTORY) Her son is at the bedside.  Overall she feels her condition is stable. Pt was tearful that she did not take good care of herself. Still has significant left sided weakness, leg>arm.   OBJECTIVE Temp:  [98 F (36.7 C)-98.7 F (37.1 C)] 98.6 F (37 C) (05/07 1704) Pulse Rate:  [64-112] 98 (05/07 1704) Cardiac Rhythm:  [-] Normal sinus rhythm;Sinus bradycardia (05/07 1200) Resp:  [16-20] 20 (05/07 1704) BP: (179-225)/(73-155) 179/91 mmHg (05/07 1704) SpO2:  [97 %-100 %] 98 % (05/07 1704) Weight:  [184 lb 4.9 oz (83.6 kg)] 184 lb 4.9 oz (83.6 kg) (05/07 0040)  No results for input(s): GLUCAP in the last 168 hours.  Recent Labs Lab 10/06/14 1153 10/06/14 1207  NA 140 142  K 3.6 3.8  CL 105 102  CO2 26  --   GLUCOSE 106* 107*  BUN 17 24*  CREATININE 1.02* 0.90  CALCIUM 9.3  --     Recent Labs Lab 10/06/14 1153  AST 27  ALT 19  ALKPHOS 68  BILITOT 0.6  PROT 7.6  ALBUMIN 3.8    Recent Labs Lab 10/06/14 1153 10/06/14 1207  WBC 5.6  --   NEUTROABS 2.3  --   HGB 14.3 16.3*  HCT 43.5 48.0*  MCV 88.6  --   PLT 180  --    No results for input(s): CKTOTAL, CKMB, CKMBINDEX, TROPONINI in the last 168 hours.  Recent Labs  10/06/14 1153  LABPROT 14.1  INR 1.08    Recent Labs  10/06/14 1228  COLORURINE YELLOW  LABSPEC 1.010  PHURINE 7.0  GLUCOSEU NEGATIVE  HGBUR TRACE*  BILIRUBINUR NEGATIVE  KETONESUR NEGATIVE  PROTEINUR 100*  UROBILINOGEN 0.2  NITRITE NEGATIVE  LEUKOCYTESUR NEGATIVE       Component Value Date/Time   CHOL 179 10/07/2014 0613   TRIG 105 10/07/2014 0613   HDL 45 10/07/2014 0613   CHOLHDL 4.0 10/07/2014 0613   VLDL 21 10/07/2014 0613   LDLCALC 113* 10/07/2014 0613   No results found for: HGBA1C    Component Value Date/Time   LABOPIA POSITIVE* 10/06/2014 1228   COCAINSCRNUR NONE DETECTED 10/06/2014 1228   LABBENZ NONE DETECTED 10/06/2014 1228   AMPHETMU NONE DETECTED  10/06/2014 1228   THCU POSITIVE* 10/06/2014 1228   LABBARB NONE DETECTED 10/06/2014 1228     Recent Labs Lab 10/06/14 1153  ETH <5    I have personally reviewed the radiological images below and agree with the radiology interpretations.  Ct Angio Head and neck W/cm &/or Wo Cm  10/07/2014   IMPRESSION: 1. No acute arterial findings. 2. No evidence of infarct progression since yesterday. No hemorrhagic conversion. 3. Diffuse bilateral petrous and cavernous carotid stenosis, likely atherosclerotic. Narrowing is up to moderate at the left petrous cavernous junction. 4. Mild beading of the bilateral V3 segments, question fibromuscular dysplasia. 5. 2 mm outpouching from the right supraclinoid carotid, aneurysm or infundibulum with parent vessel below the resolution of CTA. 6. No arterial stenosis in the neck.     Ct Head Wo Contrast  10/06/2014   IMPRESSION: No acute intracranial abnormality. No definite acute cortical infarction.      Mr Brain Wo Contrast  10/06/2014   IMPRESSION: 1. Small acute lacunar infarcts in the lateral right thalamus/posterior limb right external capsule (right PCA territory) and also the right corona radiata (right MCA territory). No mass effect or hemorrhage. 2. Superimposed chronic lacunar infarcts in the left corona radiata  and left basal ganglia (left MCA territory). 3. In light of the above in the patient's age, consider a source of cardiac or paradoxical emboli, such as PFO. And otherwise consider risk factors for accelerated small vessel disease.      2D Echocardiogram  - Mild LV chamber dilatation with severe LVH, LVEF 40-45%. Grade 1 diastolic dysfunction with increased filling pressures. Consider hypertrophic cardiomyopathy or severe hypertensive heart disease. Mild to moderate left atrial enlargement. Moderate aortic regurgitation. Trivial tricuspid regurgitation, unable to assess PASP. Cannot exclude PFO - possible small left to right  atrial shunt noted on some views. Trivial pericardial effusion.  LE venous doppler - pending  Renal artery [place] - pending   PHYSICAL EXAM  Temp:  [98 F (36.7 C)-98.7 F (37.1 C)] 98.6 F (37 C) (05/07 1704) Pulse Rate:  [64-112] 98 (05/07 1704) Resp:  [16-20] 20 (05/07 1704) BP: (179-225)/(73-155) 179/91 mmHg (05/07 1704) SpO2:  [97 %-100 %] 98 % (05/07 1704) Weight:  [184 lb 4.9 oz (83.6 kg)] 184 lb 4.9 oz (83.6 kg) (05/07 0040)  General - Well nourished, well developed, in no apparent distress.  Ophthalmologic - Sharp disc margins OU.  Cardiovascular - Regular rate and rhythm with no murmur.  Neck - supple, no carotid bruits  Mental Status -  Level of arousal and orientation to time, place, and person were intact. Language including expression, naming, repetition, comprehension was assessed and found intact.  Cranial Nerves II - XII - II - Visual field intact OU. III, IV, VI - Extraocular movements intact. V - Facial sensation decreased on the left. VII - mild left facial droop. VIII - Hearing & vestibular intact bilaterally. X - Palate elevates symmetrically. XI - Chin turning & shoulder shrug intact bilaterally. XII - Tongue protrusion intact.  Motor Strength - The patient's strength was 3/5 LUE and 2/5 LLE, good strength on the right.  Bulk was normal and fasciculations were absent.   Motor Tone - Muscle tone was assessed at the neck and appendages and was normal.  Reflexes - The patient's reflexes were symmetrical in all extremities and she had no pathological reflexes.  Sensory - Light touch, temperature/pinprick were assessed and were decreased on the left upper and lower extremities    Coordination - The patient had ataxia on the left FTN but not out of proportion to the weakness.  Tremor was absent.  Gait and Station - not able to test due to weakness.   ASSESSMENT/PLAN Ms. Shekina Cordell is a 44 y.o. female with history of HTN not on meds and  current smoker admitted for left-sided hemiparesis. Symptoms and changed.    Stroke:  Non-dominant right thalamus and corona radiata separated infarcts, still consistent with small vessel disease due to significant stroke risk factors. However, embolic stroke cannot be completely ruled out.  MRI  right thalamus and right corona radiata separated 2 infarcts  CTA head and neck showed left cavernous ICA high-grade stenosis  LE venous Doppler pending  2D Echo  EF 40-45%  LDL 113, not at goal  HgbA1c pending  Heparin subcutaneous for VTE prophylaxis  Diet Heart Room service appropriate?: Yes; Fluid consistency:: Thin   Aspirin 325 prior to admission, now on aspirin 81 mg orally every day and clopidogrel 75 mg orally every day for intracranial stenosis. Recommend to continue dural antiplatelet for 3 months and then Plavix alone.  Patient counseled to be compliant with her antithrombotic medications  Ongoing aggressive stroke risk factor management  Hypertension, malignant  Home meds:  None Permissive hypertension (OK if <220/120) for 24-48 hours post stroke and then gradually normalized within 5-7 days. Patient blood pressure very high, ranging from 180 to 220 Currently on lisinopril 20 and hydralazine iv PRN  Unstable  Patient counseled to be compliant with her blood pressure medications  Renal artery ultrasound pending  Secondary malignant hypertension labs pending  Hyperlipidemia  Home meds:  None   Currently on Lipitor 10  LDL 113, goal < 70  Continue statin at discharge  Tobacco abuse  Current smoker  Smoking cessation counseling provided  Pt is willing to quit  Other [facility] day # 1   I have personally obtained the history, examined the patient, evaluated laboratory data, individually viewed imaging studies and agree with radiology interpretations. I also discussed with Dr. Ree Kida regarding his care plan.    Rosalin Hawking, MD  PhD Stroke Neurology 10/07/2014 5:50 PM    To contact Stroke Continuity provider, please refer to [email or web address] After hours, contact General Neurology

## 2014-10-07 NOTE — Evaluation (Signed)
Physical Therapy Evaluation Patient Details Name: Brandy Roberts MRN: [id number] DOB: [birth year] Today's Date: 10/07/2014   History of Present Illness  Patient is a 44 yo female  with sudden onset of difficulty walking on 10/06/14.  MRI demo small acute lacunar infarcts in the lateral right thalamus/posterior limb right external capsule (right PCA territory) and also the right corona radiata (right MCA territory).   Clinical Impression  Patient with decr insight into her deficits, demo difficulty sustaining left UE/LE use during functional tasks (dropped phone left hand) although she demo spontaneous initiation of bil UE use during 2-handed task such as brushing her hair.  Patient may benefit from skilled PT to further assist with mobility progression and discharge planning, we well as reduce fall risk risk.    Follow Up Recommendations CIR;Supervision/Assistance - 24 hour    Equipment Recommendations  Rolling walker with 5" wheels;3in1 (PT)    Recommendations for Other Services Rehab consult     Precautions / Restrictions Precautions Precautions: Fall Precaution Comments: difficulty with full WB left leg in standing Restrictions Weight Bearing Restrictions: No      Mobility  Bed Mobility Overal bed mobility: Needs Assistance Bed Mobility: Supine to Sit (to Rt)     Supine to sit: Min assist     General bed mobility comments: assist for left leg management and scooting to EOB  Transfers Overall transfer level: Needs assistance Equipment used: Rolling walker (2 wheeled) Transfers: Sit to/from [facility] Sit to Stand: Mod assist (mod cues for sequencing and hand placement) Stand pivot transfers: Mod assist (verbal cues for seqeuncing)       General transfer comment: incoordinated left foot placement, flaccid initial contact  Ambulation/Gait Ambulation/Gait assistance: Mod assist Ambulation Distance (Feet): 3 Feet Assistive device: Rolling walker (2  wheeled) Gait Pattern/deviations: Decreased step length - right;Decreased stance time - left;Decreased step length - left;Decreased dorsiflexion - left;Staggering left     General Gait Details: hemiparetic left  Stairs            Wheelchair Mobility    Modified Rankin (Stroke Patients Only) Modified Rankin (Stroke Patients Only) Pre-Morbid Rankin Score: No symptoms Modified Rankin: Moderately severe disability     Balance Overall balance assessment: Needs assistance Sitting-balance support: Bilateral upper extremity supported;Feet supported Sitting balance-Leahy Scale: Fair   Postural control: Left lateral lean Standing balance support: Bilateral upper extremity supported Standing balance-Leahy Scale: Poor Standing balance comment: leans to left                             Pertinent Vitals/Pain Pain Assessment: No/denies pain    Home Living Family/patient expects to be discharged to:: Private residence (apartment) Living Arrangements: Alone Available Help at Discharge: Family;Available 24 hours/day (sister to assist upon discharge) Type of Home: Apartment Home Access: Stairs to enter Entrance Stairs-Rails: Left Entrance Stairs-Number of Steps: 3 Home Layout: One level Home Equipment: None 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    Prior Function Level of Independence: Independent               Hand Dominance   Dominant Hand: Right    Extremity/Trunk Assessment   Upper Extremity Assessment: Defer to OT evaluation (unable to fully reach overhead, dropped phone left hand)           Lower Extremity Assessment: LLE deficits/detail   LLE Deficits / Details: <3/5 hip flexor, 3+/5 knee extensor, 4-/5 ankle DF  Cervical /  Trunk Assessment: Normal  Communication   Communication: No difficulties  Cognition Arousal/Alertness: Awake/alert Behavior During Therapy: WFL for tasks assessed/performed Overall Cognitive  Status: Within Functional Limits for tasks assessed                      General Comments General comments (skin integrity, edema, etc.): demo initiation of left UE use during grooming task EOB    Exercises Other Exercises Other Exercises: ankle pumps, seated alternating LEs in sitting Other Exercises: minisquats, standing, min assistance x 10 Other Exercises: lateral weight shift at RW moderate assistance      Assessment/Plan    PT Assessment Patient needs continued PT services  PT Diagnosis Difficulty walking;Abnormality of gait;Hemiplegia non-dominant side   PT Problem List Decreased strength;Decreased activity tolerance;Decreased balance;Decreased mobility;Decreased knowledge of use of DME;Decreased safety awareness;Decreased knowledge of precautions;Impaired sensation  PT Treatment Interventions DME instruction;Gait training;Stair training;Functional mobility training;Therapeutic activities;Therapeutic exercise;Balance training;Neuromuscular re-education;Patient/family education   PT Goals (Current goals can be found in the Care Plan section) Acute Rehab PT Goals Patient Stated Goal: go home tomorrow PT Goal Formulation: With patient Time For Goal Achievement: 10/18/14 Potential to Achieve Goals: Good    Frequency Min 4X/week   Barriers to discharge Decreased caregiver support unknown how long sister will be able to assist    Co-evaluation               End of Session Equipment Utilized During Treatment: Gait belt Activity Tolerance: Patient tolerated treatment well Patient left: in chair;with call bell/phone within reach;with chair alarm set Nurse Communication: Mobility status;Precautions         Time: 6314-9702 PT Time Calculation (min) (ACUTE ONLY): 44 min   Charges:   PT Evaluation $Initial PT Evaluation Tier I: 1 Procedure PT Treatments $Therapeutic Activity: 8-22 mins   PT G CodesMalka So,  [place] [phone]  [place] 10/07/2014, 3:55 PM

## 2014-10-07 NOTE — Progress Notes (Signed)
  Echocardiogram 2D Echocardiogram has been performed.  Darlina Sicilian M 10/07/2014, 9:51 AM

## 2014-10-07 NOTE — Consult Note (Signed)
Referring Physician: Dr. Gardner    Chief Complaint: left leg weakness-pain, inability to walk, stroke on MRI.  HPI:                                                                                                                                         Brandy Roberts is an 44 y.o. female with a past medical history significant for HTN, smoking, comes in for further evaluation of the above stated symptoms. Brandy Roberts indicated that she went to bed feeling well but when she woke up the next days she was very off balance, and had pain and weakness left leg that caused her to fall and has been unable to walk ever since.  In addition, she said that she started slurring her words. No associated HA, vertigo, double vision, difficulty swallowing, confusion, or vision impairment. Her symptoms prompted MRI brain that I personally reviewed and revealed small acute lacunar infarcts in the lateral right thalamus/posterior limb right external capsule  and also the right corona radiata . No mass effect or hemorrhage. Superimposed chronic lacunar infarcts in the left corona radiata and left basal ganglia (left MCA territory). UDS + for tetrahydrocannabinol and opiates, ETOH level<5. INR 1.08. Cr 1.02   Date last known well: 10/05/14 Time last known well: unknown tPA Given: no, out of the window   Past Medical History  Diagnosis Date  . Hypertension     Past Surgical History  Procedure Laterality Date  . Cesarean section    . Tonsillectomy    . Bone cyst excision      History reviewed. No pertinent family history. Social History:  reports that she has been smoking.  She does not have any smokeless tobacco history on file. She reports that she does not drink alcohol or use illicit drugs. Family history: no epilepsy, MS, brain tumors, brain aneurysms. Allergies: No Known Allergies  Medications:                                                                                                                            I have reviewed the patient's current medications.  ROS:                                                                                                                                         History obtained from chart review and the patient  General ROS: negative for - chills, fatigue, fever, night sweats, weight gain or weight loss Psychological ROS: negative for - behavioral disorder, hallucinations, memory difficulties, mood swings or suicidal ideation Ophthalmic ROS: negative for - blurry vision, double vision, eye pain or loss of vision ENT ROS: negative for - epistaxis, nasal discharge, oral lesions, sore throat, tinnitus or vertigo Allergy and Immunology ROS: negative for - hives or itchy/watery eyes Hematological and Lymphatic ROS: negative for - bleeding problems, bruising or swollen lymph nodes Endocrine ROS: negative for - galactorrhea, hair pattern changes, polydipsia/polyuria or temperature intolerance Respiratory ROS: negative for - cough, hemoptysis, shortness of breath or wheezing Cardiovascular ROS: negative for - chest pain, dyspnea on exertion, edema or irregular heartbeat Gastrointestinal ROS: negative for - abdominal pain, diarrhea, hematemesis, nausea/vomiting or stool incontinence Genito-Urinary ROS: negative for - dysuria, hematuria, incontinence or urinary frequency/urgency Musculoskeletal ROS: negative for - joint swelling Neurological ROS: as noted in HPI Dermatological ROS: negative for rash and skin lesion changes   Physical exam: pleasant female in no apparent distress. Blood pressure 196/88, pulse 70, temperature 98.7 F (37.1 C), temperature source Oral, resp. rate 16, weight 91.627 kg (202 lb), last menstrual period 10/03/2014, SpO2 100 %. Head: normocephalic. Neck: supple, no bruits, no JVD. Cardiac: no murmurs. Lungs: clear. Abdomen: soft, no tender, no mass. Extremities: no edema. Skin: no rash  Neurologic Examination:                                                                                                       General: Mental Status: Alert, oriented, thought content appropriate. Mild dysarthria without evidence of aphasia.  Able to follow 3 step commands without difficulty. Cranial Nerves: II: Discs flat bilaterally; Visual fields grossly normal, pupils equal, round, reactive to light and accommodation III,IV, VI: ptosis not present, extra-ocular motions intact bilaterally V,VII: smile asymmetric due to left lower face weakness, facial light touch sensation normal bilaterally VIII: hearing normal bilaterally IX,X: uvula rises symmetrically XI: bilateral shoulder shrug XII: midline tongue extension without atrophy or fasciculations  Motor: Right : Upper extremity   5/5    Left:     Upper extremity   5/5  Lower extremity   5/5     Lower extremity   5/5 Tone and bulk:normal tone throughout; no atrophy noted Sensory: Pinprick and light touch intact throughout, bilaterally Deep Tendon Reflexes:  Right: Upper Extremity   Left: Upper extremity   biceps (C-5 to C-6) 2/4   biceps (C-5 to C-6) 2/4 tricep (C7) 2/4    triceps (C7) 2/4 Brachioradialis (C6) 2/4  Brachioradialis (C6) 2/4  Lower Extremity Lower Extremity  quadriceps (L-2 to L-4) 2/4   quadriceps (L-2 to L-4) 2/4 Achilles (S1) 2/4   Achilles (S1) 2/4  Plantars: Right: downgoing   Left: downgoing Cerebellar: normal finger-to-nose,  normal heel-to-shin test in the right. Can not perform in the left due to weakness Gait:  Patient is unable to walk    Results for orders placed or performed during the hospital encounter   of 10/06/14 (from the past 48 hour(s))  Ethanol     Status: None   Collection Time: 10/06/14 11:53 AM  Result Value Ref Range   Alcohol, Ethyl (B) <5 <5 mg/dL    Comment:        LOWEST DETECTABLE LIMIT FOR SERUM ALCOHOL IS 11 mg/dL FOR MEDICAL PURPOSES ONLY   Protime-INR     Status: None   Collection Time: 10/06/14 11:53 AM   Result Value Ref Range   Prothrombin Time 14.1 11.6 - 15.2 seconds   INR 1.08 0.00 - 1.49  APTT     Status: None   Collection Time: 10/06/14 11:53 AM  Result Value Ref Range   aPTT 34 24 - 37 seconds  CBC     Status: None   Collection Time: 10/06/14 11:53 AM  Result Value Ref Range   WBC 5.6 4.0 - 10.5 K/uL   RBC 4.91 3.87 - 5.11 MIL/uL   Hemoglobin 14.3 12.0 - 15.0 g/dL   HCT 43.5 36.0 - 46.0 %   MCV 88.6 78.0 - 100.0 fL   MCH 29.1 26.0 - 34.0 pg   MCHC 32.9 30.0 - 36.0 g/dL   RDW 14.5 11.5 - 15.5 %   Platelets 180 150 - 400 K/uL  Differential     Status: Abnormal   Collection Time: 10/06/14 11:53 AM  Result Value Ref Range   Neutrophils Relative % 41 (L) 43 - 77 %   Neutro Abs 2.3 1.7 - 7.7 K/uL   Lymphocytes Relative 44 12 - 46 %   Lymphs Abs 2.5 0.7 - 4.0 K/uL   Monocytes Relative 11 3 - 12 %   Monocytes Absolute 0.6 0.1 - 1.0 K/uL   Eosinophils Relative 3 0 - 5 %   Eosinophils Absolute 0.1 0.0 - 0.7 K/uL   Basophils Relative 1 0 - 1 %   Basophils Absolute 0.0 0.0 - 0.1 K/uL  Comprehensive metabolic panel     Status: Abnormal   Collection Time: 10/06/14 11:53 AM  Result Value Ref Range   Sodium 140 135 - 145 mmol/L   Potassium 3.6 3.5 - 5.1 mmol/L   Chloride 105 101 - 111 mmol/L   CO2 26 22 - 32 mmol/L   Glucose, Bld 106 (H) 70 - 99 mg/dL   BUN 17 6 - 20 mg/dL   Creatinine, Ser 1.02 (H) 0.44 - 1.00 mg/dL   Calcium 9.3 8.9 - 10.3 mg/dL   Total Protein 7.6 6.5 - 8.1 g/dL   Albumin 3.8 3.5 - 5.0 g/dL   AST 27 15 - 41 U/L   ALT 19 14 - 54 U/L   Alkaline Phosphatase 68 38 - 126 U/L   Total Bilirubin 0.6 0.3 - 1.2 mg/dL   GFR calc non Af Amer >60 >60 mL/min   GFR calc Af Amer >60 >60 mL/min    Comment: (NOTE) The eGFR has been calculated using the CKD EPI equation. This calculation has not been validated in all clinical situations. eGFR's persistently <60 mL/min signify possible Chronic Kidney Disease.    Anion gap 9 5 - 15  I-Stat Chem 8, ED  (not at [facility],  [facility])     Status: Abnormal   Collection Time: 10/06/14 12:07 PM  Result Value Ref Range   Sodium 142 135 - 145 mmol/L   Potassium 3.8 3.5 - 5.1 mmol/L   Chloride 102 101 - 111 mmol/L   BUN 24 (H) 6 - 20 mg/dL   Creatinine, Ser 0.90 0.44 -  1.00 mg/dL   Glucose, Bld 107 (H) 70 - 99 mg/dL   Calcium, Ion 1.15 1.12 - 1.23 mmol/L   TCO2 25 0 - 100 mmol/L   Hemoglobin 16.3 (H) 12.0 - 15.0 g/dL   HCT 48.0 (H) 36.0 - 46.0 %  I-stat troponin, ED (not at [facility], [facility])     Status: None   Collection Time: 10/06/14 12:12 PM  Result Value Ref Range   Troponin i, poc 0.03 0.00 - 0.08 ng/mL   Comment 3            Comment: Due to the release kinetics of cTnI, a negative result within the first hours of the onset of symptoms does not rule out myocardial infarction with certainty. If myocardial infarction is still suspected, repeat the test at appropriate intervals.   Urine Drug Screen     Status: Abnormal   Collection Time: 10/06/14 12:28 PM  Result Value Ref Range   Opiates POSITIVE (A) NONE DETECTED   Cocaine NONE DETECTED NONE DETECTED   Benzodiazepines NONE DETECTED NONE DETECTED   Amphetamines NONE DETECTED NONE DETECTED   Tetrahydrocannabinol POSITIVE (A) NONE DETECTED   Barbiturates NONE DETECTED NONE DETECTED    Comment:        DRUG SCREEN FOR MEDICAL PURPOSES ONLY.  IF CONFIRMATION IS NEEDED FOR ANY PURPOSE, NOTIFY LAB WITHIN 5 DAYS.        LOWEST DETECTABLE LIMITS FOR URINE DRUG SCREEN Drug Class       Cutoff (ng/mL) Amphetamine      1000 Barbiturate      200 Benzodiazepine   200 Tricyclics       300 Opiates          300 Cocaine          300 THC              50   Urinalysis, Routine w reflex microscopic     Status: Abnormal   Collection Time: 10/06/14 12:28 PM  Result Value Ref Range   Color, Urine YELLOW YELLOW   APPearance CLEAR CLEAR   Specific Gravity, Urine 1.010 1.005 - 1.030   pH 7.0 5.0 - 8.0   Glucose, UA NEGATIVE NEGATIVE mg/dL   Hgb urine dipstick TRACE (A)  NEGATIVE   Bilirubin Urine NEGATIVE NEGATIVE   Ketones, ur NEGATIVE NEGATIVE mg/dL   Protein, ur 100 (A) NEGATIVE mg/dL   Urobilinogen, UA 0.2 0.0 - 1.0 mg/dL   Nitrite NEGATIVE NEGATIVE   Leukocytes, UA NEGATIVE NEGATIVE  Urine microscopic-add on     Status: Abnormal   Collection Time: 10/06/14 12:28 PM  Result Value Ref Range   Squamous Epithelial / LPF FEW (A) RARE   WBC, UA 0-2 <3 WBC/hpf   RBC / HPF 0-2 <3 RBC/hpf   Bacteria, UA FEW (A) RARE   Urine-Other MUCOUS PRESENT    Ct Head Wo Contrast  10/06/2014   CLINICAL DATA:  Leg pain, left-sided weakness  EXAM: CT HEAD WITHOUT CONTRAST  TECHNIQUE: Contiguous axial images were obtained from the base of the skull through the vertex without intravenous contrast.  COMPARISON:  12/26/2008  FINDINGS: No skull fracture is noted. Paranasal sinuses and mastoid air cells are unremarkable. No intracranial hemorrhage, mass effect or midline shift. No acute cortical infarction. No mass lesion is noted on this unenhanced scan. Ventricular size is stable from prior exam. No intra or extra-axial fluid collection. The gray and white-matter differentiation is preserved.  IMPRESSION: No acute intracranial abnormality. No definite acute cortical   infarction.   Electronically Signed   By: Lahoma Crocker M.D.   On: 10/06/2014 13:18   Mr Brain Wo Contrast  10/06/2014   CLINICAL DATA:  44 year old female with left lower extremity pain and weakness. Initial encounter.  EXAM: MRI HEAD WITHOUT CONTRAST  TECHNIQUE: Multiplanar, multiecho pulse sequences of the brain and surrounding structures were obtained without intravenous contrast.  COMPARISON:  Head CT without contrast 1315 hours today.  FINDINGS: There is a 12 mm area of restricted diffusion in the lateral right thalamus near the posterior limb of the right internal capsule. Associated T2 and FLAIR hyperintensity, but no hemorrhage or mass effect.  Also in the right hemisphere at the corona radiata just above the low right  lateral ventricle there is a small 6-8 mm focus of restricted diffusion. See series 5, image 18. This level also demonstrates mild T2 and FLAIR hyperintensity with no hemorrhage or mass effect.  In the left corona radiata there is signal abnormality most resembling a chronic lacunar infarct (series 9, image 18). Several other scattered bilateral cerebral white matter T2 and FLAIR hyperintense foci are nonspecific. In the left globus pallidus and anterior limb of the external capsule there are signal changes suggesting chronic lacunar infarcts. There is hemosiderin associated with the former.  The brainstem and cerebellum are within normal limits. Major intracranial vascular flow voids are within normal limits.  No midline shift, mass effect, evidence of mass lesion, ventriculomegaly, extra-axial collection or acute intracranial hemorrhage. Cervicomedullary junction and pituitary are within normal limits. Negative visualized cervical spine. Visible internal auditory structures appear normal. Trace left mastoid fluid. Negative nasopharynx. Trace paranasal sinus mucosal thickening. Visualized orbit soft tissues are within normal limits. Visualized scalp soft tissues are within normal limits. Visualized bone marrow signal is within normal limits.  IMPRESSION: 1. Small acute lacunar infarcts in the lateral right thalamus/posterior limb right external capsule (right PCA territory) and also the right corona radiata (right MCA territory). No mass effect or hemorrhage. 2. Superimposed chronic lacunar infarcts in the left corona radiata and left basal ganglia (left MCA territory). 3. In light of the above in the patient's age, consider a source of cardiac or paradoxical emboli, such as PFO. And otherwise consider risk factors for accelerated small vessel disease.   Electronically Signed   By: Genevie Ann M.D.   On: 10/06/2014 20:25    Assessment: 44 y.o. female with new onset left hemiparesis leg>arm, inability to walk, and  dysarthria. MRI brain with acute lacunar infarcts in the lateral right thalamus/posterior limb right external capsule and also the right corona radiata, likely cerebral embolism. Patient has been reluctant to stay in the hospital and I explained to her the importance of admission to the hospital to further investigate the etiology of her strokes. She denies use of cocaine and is not aware of previous stroke. Although likely cerebral embolism, will not recommend starting anticoagulatio at this time and instead suggest aspirin pending results stroke work up. Agree that she ultimately will need TEE.   Stroke Risk Factors - HTN, smoking  Plan: 1. HgbA1c, fasting lipid panel 2. MRI, MRA  of the brain without contrast 3. Echocardiogram 4. Carotid dopplers 5. Prophylactic therapy-aspirin 6. Risk factor modification 7. Telemetry monitoring 8. Frequent neuro checks 9. PT/OT SLP   Dorian Pod, MD [facility] [phone]  10/07/2014, 12:10 AM

## 2014-10-07 NOTE — Progress Notes (Signed)
[facility]                                                                              Patient Demographics  Brandy Roberts, is a 44 y.o. female, DOB - 12-Feb-1971, GYK:[id number]  Admit date - 10/06/2014   Admitting Physician Etta Quill, DO  Outpatient Primary MD for the patient is Default, Provider, MD  LOS - 1   Chief Complaint  Patient presents with  . Leg Pain      HPI on 10/06/2014 by Dr. Jennette Kettle Brandy Roberts is a 44 y.o. female who presents to the ED with c/o leg pain and stumbling since she got up this morning. Patient is unable to walk straight since that time. LKW was last night. No visual changes, leg feels weak as well. No history of similar symptoms.  Assessment & Plan   Acute CVA, likely embolic -CT head: no acute intracranial abnormality  -MRI brain: Small acute lacunar infarcts, lateral right thalamus/posterior limb right external capsule, right corona radiata; chronic lacunar infarcts -Echocardiogram pending -Carotid doppler pending -LDL 113, will start on statin -hemoglobin A1c pending -PT/OT consulted pending -Neurology consulted and appreciated, pending further recommendations -Continue aspirin -Patient will likely need a TEE  Uncontrolled/Accelerated Hypertension -Patient admits to not taking her medications for hypertension because they made her feel sick -Will continue to monitor -Allow for permissive HTN in the setting of stroke -Hydralazine PRN  Polysubstance abuse/Tobacco abuse -UDS: positive for opiates and THC  -Patient counseled on cessation  Code Status: Full  Family Communication: family at bedside  Disposition Plan: Admitted, pending CVA workup  Time Spent in minutes   30 minutes  Procedures  Echocardiogram  Consults   Neurology  DVT Prophylaxis  heparin  Lab Results  Component Value Date   PLT 180 10/06/2014    Medications  Scheduled Meds: . aspirin      . aspirin  300 mg Rectal Daily   Or  . aspirin  325 mg Oral Daily  . heparin  5,000 Units Subcutaneous 3 times per day   Continuous Infusions:  PRN Meds:.hydrALAZINE  Antibiotics    Anti-infectives    None        Subjective:   Brandy Roberts seen and examined today. Patient would like to be discharged.  She admits to stopping her BP medications because they made her feel sick.  She denies chest pain, shortness of breath, dizziness, headache, abdominal pain.   Objective:   Filed Vitals:   10/07/14 0600 10/07/14 0800 10/07/14 1011 10/07/14 1200  BP: 212/84 191/155 191/82 225/92  Pulse: 72 64 73 69  Temp:  98.6 F (37 C) 98.7 F (37.1 C) 98.6 F (37 C)  TempSrc:  Oral Oral Oral  Resp: 18 16 18 16   Height:      Weight:      SpO2: 100% 98% 99% 100%    Wt Readings from Last 3 Encounters:  10/07/14 83.6 kg (184 lb 4.9 oz)  08/17/13 91.627 kg (202 lb)     Intake/Output Summary (Last 24 hours) at 10/07/14 1310 Last data filed at 10/07/14 0747  Gross per 24 hour  Intake      0  ml  Output    350 ml  Net   -350 ml    Exam  General: Well developed, well nourished, NAD, appears stated age  HEENT: NCAT, PERRLA, EOMI, Anicteic Sclera, mucous membranes moist.   Cardiovascular: S1 S2 auscultated, 2/6 SEM. Regular rate and rhythm.  Respiratory: Clear to auscultation bilaterally with equal chest rise  Abdomen: Soft, nontender, nondistended, + bowel sounds  Extremities: warm dry without cyanosis clubbing or edema  Neuro: AAOx3, cranial nerves grossly intact. Strength: Left 4/5, right 5/5  Psych: patient seems uninterested     Data Review   Micro Results No results found for this or any previous visit (from the past 240 hour(s)).  Radiology Reports Ct Angio Head W/cm &/or Wo Cm  10/07/2014   CLINICAL DATA:  Stroke with left leg pain.  EXAM: CT ANGIOGRAPHY HEAD AND NECK  TECHNIQUE: Multidetector CT imaging of the head and neck was performed using the standard protocol during bolus  administration of intravenous contrast. Multiplanar CT image reconstructions and MIPs were obtained to evaluate the vascular anatomy. Carotid stenosis measurements (when applicable) are obtained utilizing NASCET criteria, using the distal internal carotid diameter as the denominator.  CONTRAST:  28mL OMNIPAQUE IOHEXOL 350 MG/ML SOLN  COMPARISON:  10/06/2014  FINDINGS: CT HEAD  Skull and Sinuses:Negative for fracture or destructive process. The mastoids, middle ears, and imaged paranasal sinuses are clear.  Orbits: No acute abnormality.  Brain: No evidence of acute infarction, hemorrhage, hydrocephalus, or mass lesion/mass effect. A remote lacunar infarct is again noted in the left centrum semiovale. Known acute infarcts in the right hemisphere are not visualized. No hemorrhagic transformation. No hydrocephalus or shift.  CTA NECK  Aortic arch: Limited imaging of the arch. No evidence of dissection or notable plaque. There is a three-vessel origin without origin stenosis.  Right carotid system: There is no evidence of atherosclerosis, cervical carotid dissection, or stenosis. Tortuous ICA at the skullbase.  Left carotid system: There is no evidence of atherosclerosis, cervical carotid dissection, or stenosis. Tortuous ICA at the skullbase.  Vertebral arteries:Patent bilateral proximal subclavian arteries. No focal stenosis of the vertebral arteries. There is mild luminal beading of the bilateral V3 segments, without evidence of superimposed dissection.  Skeleton: No contributory findings.  CTA HEAD  Anterior circulation: Anterior communicating artery is present. Balanced carotid arteries. Posterior communicating arteries are not clearly visualized. The bilateral petrous and cavernous carotids are diffusely narrowed, without abrupt caliber change or cervical carotid involvement suggestive of dissection. This is likely atherosclerotic as there is patchy mural calcification. Stenosis is up to moderate at the  petrouscavernous junction on the left.  2 mm inferior outpouching from the right supraclinoid carotid artery without visible associated vessel.  Posterior circulation: Markedly the tortuous V4 segments. No major vessel occlusion, notable stenosis, or aneurysm.  Venous sinuses: Patent  Anatomic variants: None of significance  Delayed phase: No parenchymal enhancement  IMPRESSION: 1. No acute arterial findings. 2. No evidence of infarct progression since yesterday. No hemorrhagic conversion. 3. Diffuse bilateral petrous and cavernous carotid stenosis, likely atherosclerotic. Narrowing is up to moderate at the left petrous cavernous junction. 4. Mild beading of the bilateral V3 segments, question fibromuscular dysplasia. 5. 2 mm outpouching from the right supraclinoid carotid, aneurysm or infundibulum with parent vessel below the resolution of CTA. 6. No arterial stenosis in the neck.   Electronically Signed   By: Monte Fantasia M.D.   On: 10/07/2014 13:01   Ct Head Wo Contrast  10/06/2014   CLINICAL  DATA:  Leg pain, left-sided weakness  EXAM: CT HEAD WITHOUT CONTRAST  TECHNIQUE: Contiguous axial images were obtained from the base of the skull through the vertex without intravenous contrast.  COMPARISON:  12/26/2008  FINDINGS: No skull fracture is noted. Paranasal sinuses and mastoid air cells are unremarkable. No intracranial hemorrhage, mass effect or midline shift. No acute cortical infarction. No mass lesion is noted on this unenhanced scan. Ventricular size is stable from prior exam. No intra or extra-axial fluid collection. The gray and white-matter differentiation is preserved.  IMPRESSION: No acute intracranial abnormality. No definite acute cortical infarction.   Electronically Signed   By: Lahoma Crocker M.D.   On: 10/06/2014 13:18   Ct Angio Neck W/cm &/or Wo/cm  10/07/2014   CLINICAL DATA:  Stroke with left leg pain.  EXAM: CT ANGIOGRAPHY HEAD AND NECK  TECHNIQUE: Multidetector CT imaging of the head and  neck was performed using the standard protocol during bolus administration of intravenous contrast. Multiplanar CT image reconstructions and MIPs were obtained to evaluate the vascular anatomy. Carotid stenosis measurements (when applicable) are obtained utilizing NASCET criteria, using the distal internal carotid diameter as the denominator.  CONTRAST:  59mL OMNIPAQUE IOHEXOL 350 MG/ML SOLN  COMPARISON:  10/06/2014  FINDINGS: CT HEAD  Skull and Sinuses:Negative for fracture or destructive process. The mastoids, middle ears, and imaged paranasal sinuses are clear.  Orbits: No acute abnormality.  Brain: No evidence of acute infarction, hemorrhage, hydrocephalus, or mass lesion/mass effect. A remote lacunar infarct is again noted in the left centrum semiovale. Known acute infarcts in the right hemisphere are not visualized. No hemorrhagic transformation. No hydrocephalus or shift.  CTA NECK  Aortic arch: Limited imaging of the arch. No evidence of dissection or notable plaque. There is a three-vessel origin without origin stenosis.  Right carotid system: There is no evidence of atherosclerosis, cervical carotid dissection, or stenosis. Tortuous ICA at the skullbase.  Left carotid system: There is no evidence of atherosclerosis, cervical carotid dissection, or stenosis. Tortuous ICA at the skullbase.  Vertebral arteries:Patent bilateral proximal subclavian arteries. No focal stenosis of the vertebral arteries. There is mild luminal beading of the bilateral V3 segments, without evidence of superimposed dissection.  Skeleton: No contributory findings.  CTA HEAD  Anterior circulation: Anterior communicating artery is present. Balanced carotid arteries. Posterior communicating arteries are not clearly visualized. The bilateral petrous and cavernous carotids are diffusely narrowed, without abrupt caliber change or cervical carotid involvement suggestive of dissection. This is likely atherosclerotic as there is patchy mural  calcification. Stenosis is up to moderate at the petrouscavernous junction on the left.  2 mm inferior outpouching from the right supraclinoid carotid artery without visible associated vessel.  Posterior circulation: Markedly the tortuous V4 segments. No major vessel occlusion, notable stenosis, or aneurysm.  Venous sinuses: Patent  Anatomic variants: None of significance  Delayed phase: No parenchymal enhancement  IMPRESSION: 1. No acute arterial findings. 2. No evidence of infarct progression since yesterday. No hemorrhagic conversion. 3. Diffuse bilateral petrous and cavernous carotid stenosis, likely atherosclerotic. Narrowing is up to moderate at the left petrous cavernous junction. 4. Mild beading of the bilateral V3 segments, question fibromuscular dysplasia. 5. 2 mm outpouching from the right supraclinoid carotid, aneurysm or infundibulum with parent vessel below the resolution of CTA. 6. No arterial stenosis in the neck.   Electronically Signed   By: Monte Fantasia M.D.   On: 10/07/2014 13:01   Mr Brain Wo Contrast  10/06/2014   CLINICAL DATA:  44 year old female with left lower extremity pain and weakness. Initial encounter.  EXAM: MRI HEAD WITHOUT CONTRAST  TECHNIQUE: Multiplanar, multiecho pulse sequences of the brain and surrounding structures were obtained without intravenous contrast.  COMPARISON:  Head CT without contrast 1315 hours today.  FINDINGS: There is a 12 mm area of restricted diffusion in the lateral right thalamus near the posterior limb of the right internal capsule. Associated T2 and FLAIR hyperintensity, but no hemorrhage or mass effect.  Also in the right hemisphere at the corona radiata just above the low right lateral ventricle there is a small 6-8 mm focus of restricted diffusion. See series 5, image 18. This level also demonstrates mild T2 and FLAIR hyperintensity with no hemorrhage or mass effect.  In the left corona radiata there is signal abnormality most resembling a chronic  lacunar infarct (series 9, image 18). Several other scattered bilateral cerebral white matter T2 and FLAIR hyperintense foci are nonspecific. In the left globus pallidus and anterior limb of the external capsule there are signal changes suggesting chronic lacunar infarcts. There is hemosiderin associated with the former.  The brainstem and cerebellum are within normal limits. Major intracranial vascular flow voids are within normal limits.  No midline shift, mass effect, evidence of mass lesion, ventriculomegaly, extra-axial collection or acute intracranial hemorrhage. Cervicomedullary junction and pituitary are within normal limits. Negative visualized cervical spine. Visible internal auditory structures appear normal. Trace left mastoid fluid. Negative nasopharynx. Trace paranasal sinus mucosal thickening. Visualized orbit soft tissues are within normal limits. Visualized scalp soft tissues are within normal limits. Visualized bone marrow signal is within normal limits.  IMPRESSION: 1. Small acute lacunar infarcts in the lateral right thalamus/posterior limb right external capsule (right PCA territory) and also the right corona radiata (right MCA territory). No mass effect or hemorrhage. 2. Superimposed chronic lacunar infarcts in the left corona radiata and left basal ganglia (left MCA territory). 3. In light of the above in the patient's age, consider a source of cardiac or paradoxical emboli, such as PFO. And otherwise consider risk factors for accelerated small vessel disease.   Electronically Signed   By: Genevie Ann M.D.   On: 10/06/2014 20:25    CBC  Recent Labs Lab 10/06/14 1153 10/06/14 1207  WBC 5.6  --   HGB 14.3 16.3*  HCT 43.5 48.0*  PLT 180  --   MCV 88.6  --   MCH 29.1  --   MCHC 32.9  --   RDW 14.5  --   LYMPHSABS 2.5  --   MONOABS 0.6  --   EOSABS 0.1  --   BASOSABS 0.0  --     Chemistries   Recent Labs Lab 10/06/14 1153 10/06/14 1207  NA 140 142  K 3.6 3.8  CL 105 102    CO2 26  --   GLUCOSE 106* 107*  BUN 17 24*  CREATININE 1.02* 0.90  CALCIUM 9.3  --   AST 27  --   ALT 19  --   ALKPHOS 68  --   BILITOT 0.6  --    ------------------------------------------------------------------------------------------------------------------ estimated creatinine clearance is 86 mL/min (by C-G formula based on Cr of 0.9). ------------------------------------------------------------------------------------------------------------------ No results for input(s): HGBA1C in the last 72 hours. ------------------------------------------------------------------------------------------------------------------  Recent Labs  10/07/14 0613  CHOL 179  HDL 45  LDLCALC 113*  TRIG 105  CHOLHDL 4.0   ------------------------------------------------------------------------------------------------------------------ No results for input(s): TSH, T4TOTAL, T3FREE, THYROIDAB in the last 72 hours.  Invalid input(s): FREET3 ------------------------------------------------------------------------------------------------------------------  No results for input(s): VITAMINB12, FOLATE, FERRITIN, TIBC, IRON, RETICCTPCT in the last 72 hours.  Coagulation profile  Recent Labs Lab 10/06/14 1153  INR 1.08    No results for input(s): DDIMER in the last 72 hours.  Cardiac Enzymes No results for input(s): CKMB, TROPONINI, MYOGLOBIN in the last 168 hours.  Invalid input(s): CK ------------------------------------------------------------------------------------------------------------------ Invalid input(s): POCBNP    Neeley Sedivy D.O. on 10/07/2014 at 1:10 PM  Between 7am to 7pm - Pager - [phone]  After 7pm go to [email or web address] - password [email or web address]  And look for the night coverage person covering for me after hours  [facility] Office  [phone]

## 2014-10-08 ENCOUNTER — Encounter (HOSPITAL_COMMUNITY): Payer: Self-pay | Admitting: Cardiology

## 2014-10-08 DIAGNOSIS — E663 Overweight: Secondary | ICD-10-CM | POA: Insufficient documentation

## 2014-10-08 DIAGNOSIS — Z9119 Patient's noncompliance with other medical treatment and regimen: Secondary | ICD-10-CM | POA: Insufficient documentation

## 2014-10-08 DIAGNOSIS — Z91199 Patient's noncompliance with other medical treatment and regimen due to unspecified reason: Secondary | ICD-10-CM | POA: Insufficient documentation

## 2014-10-08 DIAGNOSIS — I351 Nonrheumatic aortic (valve) insufficiency: Secondary | ICD-10-CM | POA: Diagnosis present

## 2014-10-08 DIAGNOSIS — I119 Hypertensive heart disease without heart failure: Secondary | ICD-10-CM

## 2014-10-08 MED ORDER — METOPROLOL TARTRATE 25 MG PO TABS
25.0000 mg | ORAL_TABLET | Freq: Two times a day (BID) | ORAL | Status: DC
  Administered 2014-10-08 – 2014-10-09 (×3): 25 mg via ORAL
  Filled 2014-10-08 (×3): qty 1

## 2014-10-08 MED ORDER — LISINOPRIL 20 MG PO TABS
20.0000 mg | ORAL_TABLET | Freq: Two times a day (BID) | ORAL | Status: DC
  Administered 2014-10-08 – 2014-10-10 (×4): 20 mg via ORAL
  Filled 2014-10-08 (×4): qty 1

## 2014-10-08 NOTE — Evaluation (Addendum)
Occupational Therapy Evaluation Patient Details Name: Brandy Roberts MRN: [id number] DOB: January 27, 1971 Today's Date: 10/08/2014    History of Present Illness Patient is a 44 y.o. female  with sudden onset of difficulty walking on 10/06/14.  MRI demo small acute lacunar infarcts in the lateral right thalamus/posterior limb right external capsule (right PCA territory) and also the right corona radiata (right MCA territory).    Clinical Impression   Pt admitted with above. Pt independent with ADLs, PTA. Feel pt will benefit from acute OT to increase independence and address left UE prior to d/c. Feel pt is a great candidate for CIR.    Follow Up Recommendations  CIR    Equipment Recommendations   (defer to next venue)    Recommendations for Other Services Speech consult     Precautions / Restrictions Precautions Precautions: Fall Restrictions Weight Bearing Restrictions: No      Mobility Bed Mobility Not assessed-pt in recliner  Transfers Overall transfer level: Needs assistance Equipment used: Rolling walker (2 wheeled) Transfers: Sit to/from Stand Sit to Stand: Min assist         General transfer comment: assist to boost. cues for technique.    Balance Pt with LOB when standing requiring moderate assist. Min-Mod assist for ambulation with RW.                         ADL Overall ADL's : Needs assistance/impaired     Grooming: Set up;Supervision/safety;Sitting           Upper Body Dressing : Minimal assistance;Sitting   Lower Body Dressing: Moderate assistance;Sit to/from stand   Toilet Transfer: Moderate assistance;Ambulation;RW (chair; Min assist for sit to stand transfer.)           Functional mobility during ADLs: Moderate assistance;Rolling walker General ADL Comments: Educated on dressing technique. Encouraged pt to be using left hand for activities and gave her some suggestions.      Vision Pt wears glasses all the time; reports no  change from baseline Vision Assessment?: Yes Visual Fields: No apparent deficits   Perception     Praxis      Pertinent Vitals/Pain Pain Assessment: No/denies pain; BP 202/83      Hand Dominance Right   Extremity/Trunk Assessment Upper Extremity Assessment Upper Extremity Assessment: LUE deficits/detail LUE Deficits / Details: 3-/5 shoulder flexion LUE Sensation: decreased light touch LUE Coordination: decreased gross motor;decreased fine motor   Lower Extremity Assessment Lower Extremity Assessment: Defer to PT evaluation       Communication Communication Communication: Expressive difficulties   Cognition Arousal/Alertness: Awake/alert Behavior During Therapy: WFL for tasks assessed/performed Overall Cognitive Status: Within Functional Limits for tasks assessed                     General Comments       Exercises       Shoulder Instructions      Home Living Family/patient expects to be discharged to:: Private residence (apartment) Living Arrangements: Alone Available Help at Discharge: Family;Available 24 hours/day (sister to assist upon discharge) Type of Home: Apartment Home Access: Stairs to enter Entrance Stairs-Number of Steps: 3 Entrance Stairs-Rails: Left Home Layout: One level     Bathroom Shower/Tub: Teacher, early years/pre: Standard     Home Equipment: None   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      Prior Functioning/Environment Level of Independence: Independent  OT Diagnosis: Hemiplegia non-dominant side   OT Problem List: Decreased strength;Decreased activity tolerance;Impaired UE functional use;Decreased coordination;Impaired balance (sitting and/or standing);Decreased knowledge of use of DME or AE;Decreased knowledge of precautions   OT Treatment/Interventions: Self-care/ADL training;DME and/or AE instruction;Therapeutic exercise;Therapeutic activities;Balance  training;Patient/family education    OT Goals(Current goals can be found in the care plan section) Acute Rehab OT Goals Patient Stated Goal: to walk OT Goal Formulation: With patient Time For Goal Achievement: 10/22/14 Potential to Achieve Goals: Good ADL Goals Pt Will Perform Grooming: with set-up;with supervision;standing Pt Will Perform Lower Body Dressing: sit to/from stand;with min guard assist Pt Will Transfer to Toilet: with min guard assist;ambulating Pt Will Perform Toileting - Clothing Manipulation and hygiene: with min guard assist;sit to/from stand Additional ADL Goal #1: Pt will independently perform HEP for left upper extremity to increase strength and coordination.  OT Frequency: Min 2X/week   Barriers to D/C:            Co-evaluation              End of Session Equipment Utilized During Treatment: Gait belt;Rolling walker Nurse Communication: Mobility status;Other (comment) (BP and discussion with family about bathing)  Activity Tolerance: Patient tolerated treatment well Patient left: in chair;with call bell/phone within reach;with family/visitor present   Time: 2774-1287 OT Time Calculation (min): 18 min Charges:  OT General Charges $OT Visit: 1 Procedure OT Evaluation $Initial OT Evaluation Tier I: 1 Procedure G-CodesBenito Mccreedy OTR/L [phone] 10/08/2014, 2:34 PM

## 2014-10-08 NOTE — Progress Notes (Addendum)
STROKE TEAM PROGRESS NOTE   SUBJECTIVE (INTERVAL HISTORY) Her sons are at the bedside.  Overall she feels her condition is stable. Pt was evaluated by PT and recommend CIR.    OBJECTIVE Temp:  [97.6 F (36.4 C)-98.9 F (37.2 C)] 97.6 F (36.4 C) (05/08 1341) Pulse Rate:  [62-98] 62 (05/08 1341) Cardiac Rhythm:  [-] Normal sinus rhythm (05/07 2000) Resp:  [18-20] 18 (05/08 1341) BP: (172-231)/(74-109) 202/83 mmHg (05/08 1341) SpO2:  [95 %-100 %] 100 % (05/08 1341)  No results for input(s): GLUCAP in the last 168 hours.  Recent Labs Lab 10/06/14 1153 10/06/14 1207  NA 140 142  K 3.6 3.8  CL 105 102  CO2 26  --   GLUCOSE 106* 107*  BUN 17 24*  CREATININE 1.02* 0.90  CALCIUM 9.3  --     Recent Labs Lab 10/06/14 1153  AST 27  ALT 19  ALKPHOS 68  BILITOT 0.6  PROT 7.6  ALBUMIN 3.8    Recent Labs Lab 10/06/14 1153 10/06/14 1207  WBC 5.6  --   NEUTROABS 2.3  --   HGB 14.3 16.3*  HCT 43.5 48.0*  MCV 88.6  --   PLT 180  --    No results for input(s): CKTOTAL, CKMB, CKMBINDEX, TROPONINI in the last 168 hours.  Recent Labs  10/06/14 1153  LABPROT 14.1  INR 1.08    Recent Labs  10/06/14 1228  COLORURINE YELLOW  LABSPEC 1.010  PHURINE 7.0  GLUCOSEU NEGATIVE  HGBUR TRACE*  BILIRUBINUR NEGATIVE  KETONESUR NEGATIVE  PROTEINUR 100*  UROBILINOGEN 0.2  NITRITE NEGATIVE  LEUKOCYTESUR NEGATIVE       Component Value Date/Time   CHOL 179 10/07/2014 0613   TRIG 105 10/07/2014 0613   HDL 45 10/07/2014 0613   CHOLHDL 4.0 10/07/2014 0613   VLDL 21 10/07/2014 0613   LDLCALC 113* 10/07/2014 0613   No results found for: HGBA1C    Component Value Date/Time   LABOPIA POSITIVE* 10/06/2014 1228   COCAINSCRNUR NONE DETECTED 10/06/2014 1228   LABBENZ NONE DETECTED 10/06/2014 1228   AMPHETMU NONE DETECTED 10/06/2014 1228   THCU POSITIVE* 10/06/2014 1228   LABBARB NONE DETECTED 10/06/2014 1228     Recent Labs Lab 10/06/14 1153  ETH <5    I have  personally reviewed the radiological images below and agree with the radiology interpretations.  Ct Angio Head and neck W/cm &/or Wo Cm  10/07/2014   IMPRESSION: 1. No acute arterial findings. 2. No evidence of infarct progression since yesterday. No hemorrhagic conversion. 3. Diffuse bilateral petrous and cavernous carotid stenosis, likely atherosclerotic. Narrowing is up to moderate at the left petrous cavernous junction. 4. Mild beading of the bilateral V3 segments, question fibromuscular dysplasia. 5. 2 mm outpouching from the right supraclinoid carotid, aneurysm or infundibulum with parent vessel below the resolution of CTA. 6. No arterial stenosis in the neck.     Ct Head Wo Contrast  10/06/2014   IMPRESSION: No acute intracranial abnormality. No definite acute cortical infarction.      Mr Brain Wo Contrast  10/06/2014   IMPRESSION: 1. Small acute lacunar infarcts in the lateral right thalamus/posterior limb right external capsule (right PCA territory) and also the right corona radiata (right MCA territory). No mass effect or hemorrhage. 2. Superimposed chronic lacunar infarcts in the left corona radiata and left basal ganglia (left MCA territory). 3. In light of the above in the patient's age, consider a source of cardiac or paradoxical emboli, such as  PFO. And otherwise consider risk factors for accelerated small vessel disease.      2D Echocardiogram  - Mild LV chamber dilatation with severe LVH, LVEF 40-45%. Grade 1 diastolic dysfunction with increased filling pressures. Consider hypertrophic cardiomyopathy or severe hypertensive heart disease. Mild to moderate left atrial enlargement. Moderate aortic regurgitation. Trivial tricuspid regurgitation, unable to assess PASP. Cannot exclude PFO - possible small left to right atrial shunt noted on some views. Trivial pericardial effusion.  LE venous doppler - pending  Renal artery [place] - pending   PHYSICAL EXAM  Temp:  [97.6 F  (36.4 C)-98.9 F (37.2 C)] 97.6 F (36.4 C) (05/08 1341) Pulse Rate:  [62-98] 62 (05/08 1341) Resp:  [18-20] 18 (05/08 1341) BP: (172-231)/(74-109) 202/83 mmHg (05/08 1341) SpO2:  [95 %-100 %] 100 % (05/08 1341)  General - Well nourished, well developed, in no apparent distress.  Ophthalmologic - Sharp disc margins OU.  Cardiovascular - Regular rate and rhythm with no murmur.  Neck - supple, no carotid bruits  Mental Status -  Level of arousal and orientation to time, place, and person were intact. Language including expression, naming, repetition, comprehension was assessed and found intact.  Cranial Nerves II - XII - II - Visual field intact OU. III, IV, VI - Extraocular movements intact. V - Facial sensation decreased on the left. VII - mild left facial droop. VIII - Hearing & vestibular intact bilaterally. X - Palate elevates symmetrically. XI - Chin turning & shoulder shrug intact bilaterally. XII - Tongue protrusion intact.  Motor Strength - The patient's strength was 3/5 LUE and 2/5 LLE, good strength on the right.  Bulk was normal and fasciculations were absent.   Motor Tone - Muscle tone was assessed at the neck and appendages and was normal.  Reflexes - The patient's reflexes were symmetrical in all extremities and she had no pathological reflexes.  Sensory - Light touch, temperature/pinprick were assessed and were decreased on the left upper and lower extremities    Coordination - The patient had ataxia on the left FTN but not out of proportion to the weakness.  Tremor was absent.  Gait and Station - not able to test due to weakness.   ASSESSMENT/PLAN [name] is a 44 y.o. female with history of HTN not on meds and current smoker admitted for left-sided hemiparesis. Symptoms and changed.    Stroke:  Non-dominant right thalamus and corona radiata separated infarcts, still consistent with small vessel disease due to significant stroke risk factors.  However, embolic stroke cannot be completely ruled out.  MRI  right thalamus and right corona radiata separated 2 infarcts  CTA head and neck showed left cavernous ICA high-grade stenosis  LE venous Doppler pending  2D Echo  EF 40-45%, cardiology recommend TEE  LDL 113, not at goal  HgbA1c pending  Heparin subcutaneous for VTE prophylaxis Diet Heart Room service appropriate?: Yes; Fluid consistency:: Thin  Diet NPO time specified Except for: Sips with Meds   Aspirin 325 prior to admission, now on aspirin 81 mg orally every day and clopidogrel 75 mg orally every day for intracranial stenosis. Recommend to continue dural antiplatelet for 3 months and then Plavix alone.  Patient counseled to be compliant with her antithrombotic medications  Ongoing aggressive stroke risk factor management  Hypertension, malignant  Home meds:  None Permissive hypertension (OK if <220/120) for 24-48 hours post stroke and then gradually normalized within 5-7 days. Patient blood pressure very high, ranging from 180 to 220  Currently on lisinopril 20 and hydralazine iv PRN  Unstable  Patient counseled to be compliant with her blood pressure medications  Renal artery ultrasound pending  Secondary malignant hypertension labs pending  Hyperlipidemia  Home meds:  None   Currently on Lipitor 10  LDL 113, goal < 70  Continue statin at discharge  Tobacco abuse  Current smoker  Smoking cessation counseling provided  Pt is willing to quit  THC abuse  THC cessation counseling provided  Pt is willing to quit  Other [facility] day # 2   I have personally obtained the history, examined the patient, evaluated laboratory data, individually viewed imaging studies and agree with radiology interpretations. I also discussed with Dr. Ree Kida regarding his care plan.    Rosalin Hawking, MD PhD Stroke Neurology 10/08/2014 2:57 PM    To contact Stroke Continuity provider,  please refer to [email or web address] After hours, contact General Neurology

## 2014-10-08 NOTE — Progress Notes (Signed)
Physical Therapy Treatment Patient Details Name: Brandy Roberts MRN: [id number] DOB: 11-09-1970 Today's Date: 10/08/2014    History of Present Illness Patient is a 44 yo female  with sudden onset of difficulty walking on 10/06/14.  MRI demo small acute lacunar infarcts in the lateral right thalamus/posterior limb right external capsule (right PCA territory) and also the right corona radiata (right MCA territory).     PT Comments    Patient progressing with motor control left LE able to ambulate further with improved functional use of left LE.  Patient motivated, young, with excellent family support and progressing (today surpassed goal for ambulation), so feel interdisciplinary care on CIR most appropriate level of care prior to d/c home.  Follow Up Recommendations  CIR;Supervision/Assistance - 24 hour     Equipment Recommendations  Rolling walker with 5" wheels;3in1 (PT)    Recommendations for Other Services Rehab consult     Precautions / Restrictions Precautions Precautions: Fall    Mobility  Bed Mobility Overal bed mobility: Needs Assistance       Supine to sit: Min assist;HOB elevated     General bed mobility comments: assist for left leg management and scooting to EOB  Transfers Overall transfer level: Needs assistance Equipment used: Rolling walker (2 wheeled) Transfers: Sit to/from Stand Sit to Stand: Mod assist         General transfer comment: lifting assist from edge of bed; cues for hand and foot position due to feet with narrow BOS  Ambulation/Gait Ambulation/Gait assistance: Mod assist Ambulation Distance (Feet): 35 Feet (and 10') Assistive device: Rolling walker (2 wheeled) Gait Pattern/deviations: Step-through pattern;Decreased dorsiflexion - left;Shuffle;Decreased stance time - left;Narrow base of support;Staggering left     General Gait Details: facilitation and cues left hip control in stance, cues for left heel strike, foot clearance with  facilitation for increased right weight shift in swing   Stairs            Wheelchair Mobility    Modified Rankin (Stroke Patients Only) Modified Rankin (Stroke Patients Only) Pre-Morbid Rankin Score: No symptoms Modified Rankin: Moderately severe disability     Balance Overall balance assessment: Needs assistance       Postural control: Left lateral lean Standing balance support: Bilateral upper extremity supported Standing balance-Leahy Scale: Poor Standing balance comment: assist to maintain balance with walker and at sink washing hands due to left lateral LOB                    Cognition Arousal/Alertness: Awake/alert Behavior During Therapy: WFL for tasks assessed/performed Overall Cognitive Status: Within Functional Limits for tasks assessed                      Exercises General Exercises - Lower Extremity Hip ABduction/ADduction: Strengthening;Left;10 reps;Seated (isometric abd into blanket rolled at side 5 sec hold) Toe Raises: AROM;Both;10 reps;Seated (with 5 sec hold) Heel Raises: AROM;Both;10 reps;Seated (with 5 sec hold)    General Comments        Pertinent Vitals/Pain Pain Assessment: Faces Faces Pain Scale: Hurts even more Pain Location: headache Pain Descriptors / Indicators: Aching Pain Intervention(s): RN gave pain meds during session    Home Living                      Prior Function            PT Goals (current goals can now be found in the care plan section) Progress towards PT  goals: Progressing toward goals    Frequency  Min 4X/week    PT Plan Current plan remains appropriate    Co-evaluation             End of Session Equipment Utilized During Treatment: Gait belt Activity Tolerance: Patient tolerated treatment well;Patient limited by fatigue Patient left: in chair;with call bell/phone within reach;with chair alarm set;with family/visitor present     Time: 1050-1117 PT Time Calculation  (min) (ACUTE ONLY): 27 min  Charges:  $Gait Training: 8-22 mins $Therapeutic Exercise: 8-22 mins                    G Codes:      [name] Newland,CYNDI Oct 14, 2014, 12:05 PM  Magda Kiel, [place] 10-14-14

## 2014-10-08 NOTE — Progress Notes (Addendum)
[facility]                                                                              Patient Demographics  Brandy Roberts, is a 44 y.o. female, DOB - 08-25-1970, GGE:[id number]  Admit date - 10/06/2014   Admitting Physician Etta Quill, DO  Outpatient Primary MD for the patient is Default, Provider, MD  LOS - 2   Chief Complaint  Patient presents with  . Leg Pain      HPI on 10/06/2014 by Dr. Jennette Kettle Brandy Roberts is a 44 y.o. female who presents to the ED with c/o leg pain and stumbling since she got up this morning. Patient is unable to walk straight since that time. LKW was last night. No visual changes, leg feels weak as well. No history of similar symptoms.  Assessment & Plan   Acute CVA, likely embolic -CT head: no acute intracranial abnormality  -MRI brain: Small acute lacunar infarcts, lateral right thalamus/posterior limb right external capsule, right corona radiata; chronic lacunar infarcts -Echocardiogram 46-50%, Grade 1 diastolic dysfunction -Carotid doppler pending -Lower extremity doppler pending -LDL 113, continue statin -hemoglobin A1c pending -PT consulted and recommended CIR -Neurology consulted and appreciated, pending further recommendations -Continue aspirin and plavix for 3 months, then plavix alone -Spoke with neurology, does not feel patient needs TEE at this time  Uncontrolled/Accelerated Hypertension -Patient admits to not taking her medications for hypertension because they made her feel sick- she was taking coreg and HCTZ -Will continue to monitor -Allow for permissive HTN in the setting of stroke (24-48hrs) -Hydralazine PRN -Continue lisinopril and metoprolol  New Systolic/Diastolic CHF -Currently compensated -Will monitor daily weights and intake/output -Likely secondary to uncontrolled HTN -Cardiology consulted and appreicated  Polysubstance abuse/Tobacco abuse -UDS: positive for opiates and THC  -Patient  counseled on cessation  Code Status: Full  Family Communication: family at bedside  Disposition Plan: Admitted, pending cardiology consult, PT/OT reevaulation.  Time Spent in minutes   30 minutes  Procedures  Echocardiogram  Consults   Neurology  DVT Prophylaxis  heparin  Lab Results  Component Value Date   PLT 180 10/06/2014    Medications  Scheduled Meds: . aspirin EC  81 mg Oral Daily  . atorvastatin  10 mg Oral q1800  . clopidogrel  75 mg Oral Daily  . heparin  5,000 Units Subcutaneous 3 times per day  . lisinopril  20 mg Oral BID  . metoprolol tartrate  25 mg Oral BID   Continuous Infusions:  PRN Meds:.hydrALAZINE, ibuprofen  Antibiotics    Anti-infectives    None        Subjective:   Brandy Roberts seen and examined today. Patient would like to go home.  She feels her weakness has improved.  She continues to have slight headache.  Denis chest pain, shortness of breath, dizziness, headache, abdominal pain.   Objective:   Filed Vitals:   10/07/14 2119 10/08/14 0132 10/08/14 0702 10/08/14 0940  BP: 217/109 184/74 212/93 172/78  Pulse:  90 83 90  Temp:  98.5 F (36.9 C) 97.9 F (36.6 C) 98.1 F (36.7 C)  TempSrc:  Oral Oral Oral  Resp:  20 20  18  Height:      Weight:      SpO2:  95% 96% 98%    Wt Readings from Last 3 Encounters:  10/07/14 83.6 kg (184 lb 4.9 oz)  08/17/13 91.627 kg (202 lb)     Intake/Output Summary (Last 24 hours) at 10/08/14 1028 Last data filed at 10/08/14 0941  Gross per 24 hour  Intake    360 ml  Output      0 ml  Net    360 ml    Exam  General: Well developed, well nourished, No distress  HEENT: NCAT,mucous membranes moist.   Cardiovascular: S1 S2 auscultated, 2/6 SEM. RRR  Respiratory: Clear to auscultation   Abdomen: Soft, nontender, nondistended, + bowel sounds  Extremities: warm dry without cyanosis clubbing or edema  Neuro: AAOx3, slight left facial droop, strength improving on the left    Data Review   Micro Results No results found for this or any previous visit (from the past 240 hour(s)).  Radiology Reports Ct Angio Head W/cm &/or Wo Cm  10/07/2014   CLINICAL DATA:  Stroke with left leg pain.  EXAM: CT ANGIOGRAPHY HEAD AND NECK  TECHNIQUE: Multidetector CT imaging of the head and neck was performed using the standard protocol during bolus administration of intravenous contrast. Multiplanar CT image reconstructions and MIPs were obtained to evaluate the vascular anatomy. Carotid stenosis measurements (when applicable) are obtained utilizing NASCET criteria, using the distal internal carotid diameter as the denominator.  CONTRAST:  40mL OMNIPAQUE IOHEXOL 350 MG/ML SOLN  COMPARISON:  10/06/2014  FINDINGS: CT HEAD  Skull and Sinuses:Negative for fracture or destructive process. The mastoids, middle ears, and imaged paranasal sinuses are clear.  Orbits: No acute abnormality.  Brain: No evidence of acute infarction, hemorrhage, hydrocephalus, or mass lesion/mass effect. A remote lacunar infarct is again noted in the left centrum semiovale. Known acute infarcts in the right hemisphere are not visualized. No hemorrhagic transformation. No hydrocephalus or shift.  CTA NECK  Aortic arch: Limited imaging of the arch. No evidence of dissection or notable plaque. There is a three-vessel origin without origin stenosis.  Right carotid system: There is no evidence of atherosclerosis, cervical carotid dissection, or stenosis. Tortuous ICA at the skullbase.  Left carotid system: There is no evidence of atherosclerosis, cervical carotid dissection, or stenosis. Tortuous ICA at the skullbase.  Vertebral arteries:Patent bilateral proximal subclavian arteries. No focal stenosis of the vertebral arteries. There is mild luminal beading of the bilateral V3 segments, without evidence of superimposed dissection.  Skeleton: No contributory findings.  CTA HEAD  Anterior circulation: Anterior communicating artery is  present. Balanced carotid arteries. Posterior communicating arteries are not clearly visualized. The bilateral petrous and cavernous carotids are diffusely narrowed, without abrupt caliber change or cervical carotid involvement suggestive of dissection. This is likely atherosclerotic as there is patchy mural calcification. Stenosis is up to moderate at the petrouscavernous junction on the left.  2 mm inferior outpouching from the right supraclinoid carotid artery without visible associated vessel.  Posterior circulation: Markedly the tortuous V4 segments. No major vessel occlusion, notable stenosis, or aneurysm.  Venous sinuses: Patent  Anatomic variants: None of significance  Delayed phase: No parenchymal enhancement  IMPRESSION: 1. No acute arterial findings. 2. No evidence of infarct progression since yesterday. No hemorrhagic conversion. 3. Diffuse bilateral petrous and cavernous carotid stenosis, likely atherosclerotic. Narrowing is up to moderate at the left petrous cavernous junction. 4. Mild beading of the bilateral V3 segments, question fibromuscular dysplasia. 5.  2 mm outpouching from the right supraclinoid carotid, aneurysm or infundibulum with parent vessel below the resolution of CTA. 6. No arterial stenosis in the neck.   Electronically Signed   By: Monte Fantasia M.D.   On: 10/07/2014 13:01   Ct Head Wo Contrast  10/06/2014   CLINICAL DATA:  Leg pain, left-sided weakness  EXAM: CT HEAD WITHOUT CONTRAST  TECHNIQUE: Contiguous axial images were obtained from the base of the skull through the vertex without intravenous contrast.  COMPARISON:  12/26/2008  FINDINGS: No skull fracture is noted. Paranasal sinuses and mastoid air cells are unremarkable. No intracranial hemorrhage, mass effect or midline shift. No acute cortical infarction. No mass lesion is noted on this unenhanced scan. Ventricular size is stable from prior exam. No intra or extra-axial fluid collection. The gray and white-matter  differentiation is preserved.  IMPRESSION: No acute intracranial abnormality. No definite acute cortical infarction.   Electronically Signed   By: Lahoma Crocker M.D.   On: 10/06/2014 13:18   Ct Angio Neck W/cm &/or Wo/cm  10/07/2014   CLINICAL DATA:  Stroke with left leg pain.  EXAM: CT ANGIOGRAPHY HEAD AND NECK  TECHNIQUE: Multidetector CT imaging of the head and neck was performed using the standard protocol during bolus administration of intravenous contrast. Multiplanar CT image reconstructions and MIPs were obtained to evaluate the vascular anatomy. Carotid stenosis measurements (when applicable) are obtained utilizing NASCET criteria, using the distal internal carotid diameter as the denominator.  CONTRAST:  41mL OMNIPAQUE IOHEXOL 350 MG/ML SOLN  COMPARISON:  10/06/2014  FINDINGS: CT HEAD  Skull and Sinuses:Negative for fracture or destructive process. The mastoids, middle ears, and imaged paranasal sinuses are clear.  Orbits: No acute abnormality.  Brain: No evidence of acute infarction, hemorrhage, hydrocephalus, or mass lesion/mass effect. A remote lacunar infarct is again noted in the left centrum semiovale. Known acute infarcts in the right hemisphere are not visualized. No hemorrhagic transformation. No hydrocephalus or shift.  CTA NECK  Aortic arch: Limited imaging of the arch. No evidence of dissection or notable plaque. There is a three-vessel origin without origin stenosis.  Right carotid system: There is no evidence of atherosclerosis, cervical carotid dissection, or stenosis. Tortuous ICA at the skullbase.  Left carotid system: There is no evidence of atherosclerosis, cervical carotid dissection, or stenosis. Tortuous ICA at the skullbase.  Vertebral arteries:Patent bilateral proximal subclavian arteries. No focal stenosis of the vertebral arteries. There is mild luminal beading of the bilateral V3 segments, without evidence of superimposed dissection.  Skeleton: No contributory findings.  CTA HEAD   Anterior circulation: Anterior communicating artery is present. Balanced carotid arteries. Posterior communicating arteries are not clearly visualized. The bilateral petrous and cavernous carotids are diffusely narrowed, without abrupt caliber change or cervical carotid involvement suggestive of dissection. This is likely atherosclerotic as there is patchy mural calcification. Stenosis is up to moderate at the petrouscavernous junction on the left.  2 mm inferior outpouching from the right supraclinoid carotid artery without visible associated vessel.  Posterior circulation: Markedly the tortuous V4 segments. No major vessel occlusion, notable stenosis, or aneurysm.  Venous sinuses: Patent  Anatomic variants: None of significance  Delayed phase: No parenchymal enhancement  IMPRESSION: 1. No acute arterial findings. 2. No evidence of infarct progression since yesterday. No hemorrhagic conversion. 3. Diffuse bilateral petrous and cavernous carotid stenosis, likely atherosclerotic. Narrowing is up to moderate at the left petrous cavernous junction. 4. Mild beading of the bilateral V3 segments, question fibromuscular dysplasia. 5. 2 mm  outpouching from the right supraclinoid carotid, aneurysm or infundibulum with parent vessel below the resolution of CTA. 6. No arterial stenosis in the neck.   Electronically Signed   By: Monte Fantasia M.D.   On: 10/07/2014 13:01   Mr Brain Wo Contrast  10/06/2014   CLINICAL DATA:  44 year old female with left lower extremity pain and weakness. Initial encounter.  EXAM: MRI HEAD WITHOUT CONTRAST  TECHNIQUE: Multiplanar, multiecho pulse sequences of the brain and surrounding structures were obtained without intravenous contrast.  COMPARISON:  Head CT without contrast 1315 hours today.  FINDINGS: There is a 12 mm area of restricted diffusion in the lateral right thalamus near the posterior limb of the right internal capsule. Associated T2 and FLAIR hyperintensity, but no hemorrhage or  mass effect.  Also in the right hemisphere at the corona radiata just above the low right lateral ventricle there is a small 6-8 mm focus of restricted diffusion. See series 5, image 18. This level also demonstrates mild T2 and FLAIR hyperintensity with no hemorrhage or mass effect.  In the left corona radiata there is signal abnormality most resembling a chronic lacunar infarct (series 9, image 18). Several other scattered bilateral cerebral white matter T2 and FLAIR hyperintense foci are nonspecific. In the left globus pallidus and anterior limb of the external capsule there are signal changes suggesting chronic lacunar infarcts. There is hemosiderin associated with the former.  The brainstem and cerebellum are within normal limits. Major intracranial vascular flow voids are within normal limits.  No midline shift, mass effect, evidence of mass lesion, ventriculomegaly, extra-axial collection or acute intracranial hemorrhage. Cervicomedullary junction and pituitary are within normal limits. Negative visualized cervical spine. Visible internal auditory structures appear normal. Trace left mastoid fluid. Negative nasopharynx. Trace paranasal sinus mucosal thickening. Visualized orbit soft tissues are within normal limits. Visualized scalp soft tissues are within normal limits. Visualized bone marrow signal is within normal limits.  IMPRESSION: 1. Small acute lacunar infarcts in the lateral right thalamus/posterior limb right external capsule (right PCA territory) and also the right corona radiata (right MCA territory). No mass effect or hemorrhage. 2. Superimposed chronic lacunar infarcts in the left corona radiata and left basal ganglia (left MCA territory). 3. In light of the above in the patient's age, consider a source of cardiac or paradoxical emboli, such as PFO. And otherwise consider risk factors for accelerated small vessel disease.   Electronically Signed   By: Genevie Ann M.D.   On: 10/06/2014 20:25     CBC  Recent Labs Lab 10/06/14 1153 10/06/14 1207  WBC 5.6  --   HGB 14.3 16.3*  HCT 43.5 48.0*  PLT 180  --   MCV 88.6  --   MCH 29.1  --   MCHC 32.9  --   RDW 14.5  --   LYMPHSABS 2.5  --   MONOABS 0.6  --   EOSABS 0.1  --   BASOSABS 0.0  --     Chemistries   Recent Labs Lab 10/06/14 1153 10/06/14 1207  NA 140 142  K 3.6 3.8  CL 105 102  CO2 26  --   GLUCOSE 106* 107*  BUN 17 24*  CREATININE 1.02* 0.90  CALCIUM 9.3  --   AST 27  --   ALT 19  --   ALKPHOS 68  --   BILITOT 0.6  --    ------------------------------------------------------------------------------------------------------------------ estimated creatinine clearance is 86 mL/min (by C-G formula based on Cr of 0.9). ------------------------------------------------------------------------------------------------------------------ No  results for input(s): HGBA1C in the last 72 hours. ------------------------------------------------------------------------------------------------------------------  Recent Labs  10/07/14 0613  CHOL 179  HDL 45  LDLCALC 113*  TRIG 105  CHOLHDL 4.0   ------------------------------------------------------------------------------------------------------------------ No results for input(s): TSH, T4TOTAL, T3FREE, THYROIDAB in the last 72 hours.  Invalid input(s): FREET3 ------------------------------------------------------------------------------------------------------------------ No results for input(s): VITAMINB12, FOLATE, FERRITIN, TIBC, IRON, RETICCTPCT in the last 72 hours.  Coagulation profile  Recent Labs Lab 10/06/14 1153  INR 1.08    No results for input(s): DDIMER in the last 72 hours.  Cardiac Enzymes No results for input(s): CKMB, TROPONINI, MYOGLOBIN in the last 168 hours.  Invalid input(s): CK ------------------------------------------------------------------------------------------------------------------ Invalid input(s):  POCBNP    Jusitn Salsgiver D.O. on 10/08/2014 at 10:28 AM  Between 7am to 7pm - Pager - [phone]  After 7pm go to [email or web address] - password [email or web address]  And look for the night coverage person covering for me after hours  [facility] Office  [phone]

## 2014-10-08 NOTE — Progress Notes (Signed)
    [facility] has been requested to perform a transesophageal echocardiogram on 05/09 for CVA.  After careful review of history and examination, the risks and benefits of transesophageal echocardiogram have been explained including risks of esophageal damage, perforation (1:10,000 risk), bleeding, pharyngeal hematoma as well as other potential complications associated with conscious sedation including aspiration, arrhythmia, respiratory failure and death. Alternatives to treatment were discussed, questions were answered. Patient is willing to proceed.   Brandy Roberts 10/08/2014 5:22 PM

## 2014-10-08 NOTE — Consult Note (Signed)
Cardiology Consult Note  Admit date: 10/06/2014 Name: Brandy Roberts 43 y.o.  female DOB:  November 24, 1970 MRN:  [id number]  Today's date:  10/08/2014  Referring Physician:   [facility]  Primary Physician:   None  Reason for Consultation:    Recent multiple strokes   IMPRESSIONS: 1.  Recent multiple strokes involving the right MCA territory and chronically for her infarcts in the left MCA territory 2.  Hypertensive heart disease with evidence of LV dysfunction and marked LV hypertrophy 3.  Chronic combined systolic and diastolic heart failure likely due to uncontrolled hypertension 4.  Aortic valve disease with moderate aortic regurgitation 5.  Tobacco abuse 6.  Overweight with need to lose weight  RECOMMENDATION: Reviewed echo.  Her findings are consistent with uncontrolled hypertension.  I would recommend that she have a TEE to exclude a PFO as a cause for stroke.  Uncontrolled hypertension as a more likely cause particularly with lacunar infarcts.  Discussed importance of weight loss, cigarette cessation and excellent blood pressure control.  On reviewing the records compliance with medical regimens in the past has been difficult.  She will need to have good primary care follow-up of her blood pressure ranged on discharge.  HISTORY: This 44 year old female was admitted with difficulty walking and some weakness on her left side.  She was found to have a stroke was and brought in to the hospital.  She has a long-standing history of hypertension but has taken no antihypertensive medicines for the past 2 years and has not really had any medical care.  On reviewing the records she has had recent emergency room visits and remote were emergency room visits at which point she has stopped taking her blood pressure medicines in the past.  She had an echocardiogram showing an ejection fraction of 40-45% with marked LVH and there was some consideration as to whether or not she had a small  left-to-right atrial shunt on some views.  She also had moderate aortic regurgitation.  She has never been told of a heart murmur before.  She normally works as a Secretary/administrator does not have shortness of breath or chest pain.  She normally denies PND, orthopnea or edema.  Past Medical History  Diagnosis Date  . Hypertension       Past Surgical History  Procedure Laterality Date  . Cesarean section    . Tonsillectomy    . Ganglion cyst excision       Allergies:  has No Known Allergies.   Medications: Prior to Admission medications   Medication Sig Start Date End Date Taking? Authorizing Provider  aspirin EC 325 MG tablet Take 1 tablet (325 mg total) by mouth daily. 10/06/14 11/06/14  Ellwood Dense, MD  oxyCODONE-acetaminophen (PERCOCET) 5-325 MG per tablet Take 2 tablets by mouth at bedtime as needed for severe pain. Patient not taking: Reported on 10/06/2014 08/17/13   Riki Altes, MD    Family History: Family Status  Relation Status Death Age  . Father Deceased     unknown  . Mother Deceased 64    cancer of liver or gallbladder  . Sister Alive     hypertension  . Sister Alive   . Sister Alive     Social History:   reports that she has been smoking.  She has never used smokeless tobacco. She reports that she does not drink alcohol or use illicit drugs.   History   Social History Narrative   Works as a Secretary/administrator at the [employer]  Review of Systems: Says that she has lost weight recently.  Wears glasses to read, no other eye problems, recent gait disorders noted above, urinary frequency, other than as noted above the remainder of the review of systems is unremarkable.  Physical Exam: BP 172/78 mmHg  Pulse 90  Temp(Src) 98.1 F (36.7 C) (Oral)  Resp 18  Ht 5\' 6"  (1.676 m)  Wt 83.6 kg (184 lb 4.9 oz)  BMI 29.76 kg/m2  SpO2 98%  LMP 10/03/2014  General appearance: Overweight black female currently in no acute distress sitting in the chair at the site of the  bed Head: Normocephalic, without obvious abnormality, atraumatic Eyes: conjunctivae/corneas clear. PERRL, EOM's intact. Fundi not examined Neck: no adenopathy, no carotid bruit, no JVD, supple, symmetrical, trachea midline and Thyroid appear somewhat full Lungs: clear to auscultation bilaterally Heart: Regular rhythm, normal S1 and S2, no S3, 2/6 early systolic murmur across the aortic valve, 2/6 diastolic murmur at aortic valve Abdomen: soft, non-tender; bowel sounds normal; no masses,  no organomegaly Pelvic: deferred Extremities: extremities normal, atraumatic, no cyanosis or edema Pulses: 2+ and symmetric Skin: Skin color, texture, turgor normal. No rashes or lesions Neurologic: Gait not formally tested as sitting up in chair, cranial nerves were intact, speech is normal, weakness of the left arm and left leg   Labs: CBC  Recent Labs  10/06/14 1153 10/06/14 1207  WBC 5.6  --   RBC 4.91  --   HGB 14.3 16.3*  HCT 43.5 48.0*  PLT 180  --   MCV 88.6  --   MCH 29.1  --   MCHC 32.9  --   RDW 14.5  --   LYMPHSABS 2.5  --   MONOABS 0.6  --   EOSABS 0.1  --   BASOSABS 0.0  --    CMP   Recent Labs  10/06/14 1153 10/06/14 1207  NA 140 142  K 3.6 3.8  CL 105 102  CO2 26  --   GLUCOSE 106* 107*  BUN 17 24*  CREATININE 1.02* 0.90  CALCIUM 9.3  --   PROT 7.6  --   ALBUMIN 3.8  --   AST 27  --   ALT 19  --   ALKPHOS 68  --   BILITOT 0.6  --   GFRNONAA >60  --   GFRAA >60  --    EKG: Sinus rhythm with LVH and ST and T-wave changes  ECHO: Marked concentric LVH, EF 40-45%, moderate aortic regurgitation, mild to moderate left atrial enlargement, cannot exclude small left-to-right atrial shunt  Signed:  W. Doristine Church MD [facility]   Cardiology Consultant  10/08/2014, 11:50 AM

## 2014-10-09 ENCOUNTER — Encounter (HOSPITAL_COMMUNITY)
Admission: EM | Disposition: A | Discharge status: Rehabilitation Facility | Payer: Self-pay | Source: Home / Self Care | Attending: Internal Medicine

## 2014-10-09 ENCOUNTER — Ambulatory Visit (HOSPITAL_COMMUNITY): Payer: No Typology Code available for payment source

## 2014-10-09 ENCOUNTER — Inpatient Hospital Stay (HOSPITAL_COMMUNITY): Payer: No Typology Code available for payment source

## 2014-10-09 ENCOUNTER — Encounter (HOSPITAL_COMMUNITY): Payer: Self-pay | Admitting: *Deleted

## 2014-10-09 DIAGNOSIS — I639 Cerebral infarction, unspecified: Secondary | ICD-10-CM | POA: Insufficient documentation

## 2014-10-09 DIAGNOSIS — I351 Nonrheumatic aortic (valve) insufficiency: Secondary | ICD-10-CM

## 2014-10-09 HISTORY — PX: TEE WITHOUT CARDIOVERSION: SHX5443

## 2014-10-09 LAB — HEMOGLOBIN A1C
HEMOGLOBIN A1C: 5.8 % — AB (ref 4.8–5.6)
Mean Plasma Glucose: 120 mg/dL

## 2014-10-09 SURGERY — ECHOCARDIOGRAM, TRANSESOPHAGEAL
Anesthesia: Moderate Sedation

## 2014-10-09 MED ORDER — DIPHENHYDRAMINE HCL 50 MG/ML IJ SOLN
INTRAMUSCULAR | Status: AC
  Filled 2014-10-09: qty 1

## 2014-10-09 MED ORDER — METOPROLOL TARTRATE 50 MG PO TABS
50.0000 mg | ORAL_TABLET | Freq: Two times a day (BID) | ORAL | Status: DC
  Administered 2014-10-09 – 2014-10-10 (×2): 50 mg via ORAL
  Filled 2014-10-09 (×2): qty 1

## 2014-10-09 MED ORDER — MIDAZOLAM HCL 5 MG/ML IJ SOLN
INTRAMUSCULAR | Status: AC
  Filled 2014-10-09: qty 3

## 2014-10-09 MED ORDER — MIDAZOLAM HCL 10 MG/2ML IJ SOLN
INTRAMUSCULAR | Status: DC | PRN
  Administered 2014-10-09 (×2): 1 mg via INTRAVENOUS
  Administered 2014-10-09: 2 mg via INTRAVENOUS
  Administered 2014-10-09: 1 mg via INTRAVENOUS

## 2014-10-09 MED ORDER — DIPHENHYDRAMINE HCL 50 MG/ML IJ SOLN
INTRAMUSCULAR | Status: DC | PRN
  Administered 2014-10-09: 25 mg via INTRAVENOUS

## 2014-10-09 MED ORDER — FENTANYL CITRATE (PF) 100 MCG/2ML IJ SOLN
INTRAMUSCULAR | Status: AC
  Filled 2014-10-09: qty 4

## 2014-10-09 MED ORDER — BISACODYL 5 MG PO TBEC
10.0000 mg | DELAYED_RELEASE_TABLET | Freq: Every day | ORAL | Status: DC | PRN
  Administered 2014-10-09: 10 mg via ORAL
  Filled 2014-10-09: qty 2

## 2014-10-09 MED ORDER — SODIUM CHLORIDE 0.9 % IV SOLN: INTRAVENOUS | Status: DC

## 2014-10-09 MED ORDER — HYDRALAZINE HCL 25 MG PO TABS
25.0000 mg | ORAL_TABLET | Freq: Three times a day (TID) | ORAL | Status: DC
  Administered 2014-10-09 – 2014-10-10 (×2): 25 mg via ORAL
  Filled 2014-10-09 (×2): qty 1

## 2014-10-09 MED ORDER — FENTANYL CITRATE (PF) 100 MCG/2ML IJ SOLN
INTRAMUSCULAR | Status: DC | PRN
  Administered 2014-10-09 (×2): 25 ug via INTRAVENOUS

## 2014-10-09 MED ORDER — DOCUSATE SODIUM 100 MG PO CAPS
100.0000 mg | ORAL_CAPSULE | Freq: Two times a day (BID) | ORAL | Status: DC
  Administered 2014-10-09: 100 mg via ORAL
  Filled 2014-10-09: qty 1

## 2014-10-09 MED ORDER — SODIUM CHLORIDE 0.9 % IV SOLN
INTRAVENOUS | Status: DC
  Administered 2014-10-09: 10 mL via INTRAVENOUS

## 2014-10-09 NOTE — Progress Notes (Signed)
STROKE TEAM PROGRESS NOTE   SUBJECTIVE (INTERVAL HISTORY) Patient states she is nervous about upcoming TEE. Patient reassured that she would be given sedation and that few people actually remember the test. She was relieved. BP fluctuating much.    OBJECTIVE Temp:  [97.4 F (36.3 C)-98.5 F (36.9 C)] 98.3 F (36.8 C) (05/09 0950) Pulse Rate:  [56-79] 56 (05/09 0950) Cardiac Rhythm:  [-] Normal sinus rhythm (05/09 0800) Resp:  [16-20] 18 (05/09 0950) BP: (186-214)/(69-83) 186/69 mmHg (05/09 0950) SpO2:  [92 %-100 %] 92 % (05/09 0950)  No results for input(s): GLUCAP in the last 168 hours.  Recent Labs Lab 10/06/14 1153 10/06/14 1207  NA 140 142  K 3.6 3.8  CL 105 102  CO2 26  --   GLUCOSE 106* 107*  BUN 17 24*  CREATININE 1.02* 0.90  CALCIUM 9.3  --     Recent Labs Lab 10/06/14 1153  AST 27  ALT 19  ALKPHOS 68  BILITOT 0.6  PROT 7.6  ALBUMIN 3.8    Recent Labs Lab 10/06/14 1153 10/06/14 1207  WBC 5.6  --   NEUTROABS 2.3  --   HGB 14.3 16.3*  HCT 43.5 48.0*  MCV 88.6  --   PLT 180  --    No results for input(s): CKTOTAL, CKMB, CKMBINDEX, TROPONINI in the last 168 hours.  Recent Labs  10/06/14 1153  LABPROT 14.1  INR 1.08    Recent Labs  10/06/14 1228  COLORURINE YELLOW  LABSPEC 1.010  PHURINE 7.0  GLUCOSEU NEGATIVE  HGBUR TRACE*  BILIRUBINUR NEGATIVE  KETONESUR NEGATIVE  PROTEINUR 100*  UROBILINOGEN 0.2  NITRITE NEGATIVE  LEUKOCYTESUR NEGATIVE       Component Value Date/Time   CHOL 179 10/07/2014 0613   TRIG 105 10/07/2014 0613   HDL 45 10/07/2014 0613   CHOLHDL 4.0 10/07/2014 0613   VLDL 21 10/07/2014 0613   LDLCALC 113* 10/07/2014 0613   Lab Results  Component Value Date   HGBA1C 5.8* 10/07/2014      Component Value Date/Time   LABOPIA POSITIVE* 10/06/2014 1228   COCAINSCRNUR NONE DETECTED 10/06/2014 1228   LABBENZ NONE DETECTED 10/06/2014 1228   AMPHETMU NONE DETECTED 10/06/2014 1228   THCU POSITIVE* 10/06/2014 1228    LABBARB NONE DETECTED 10/06/2014 1228     Recent Labs Lab 10/06/14 1153  ETH <5     Ct Angio Head and neck W/cm &/or Wo Cm  10/07/2014   IMPRESSION: 1. No acute arterial findings. 2. No evidence of infarct progression since yesterday. No hemorrhagic conversion. 3. Diffuse bilateral petrous and cavernous carotid stenosis, likely atherosclerotic. Narrowing is up to moderate at the left petrous cavernous junction. 4. Mild beading of the bilateral V3 segments, question fibromuscular dysplasia. 5. 2 mm outpouching from the right supraclinoid carotid, aneurysm or infundibulum with parent vessel below the resolution of CTA. 6. No arterial stenosis in the neck.     Ct Head Wo Contrast  10/06/2014   IMPRESSION: No acute intracranial abnormality. No definite acute cortical infarction.      Mr Brain Wo Contrast  10/06/2014   IMPRESSION: 1. Small acute lacunar infarcts in the lateral right thalamus/posterior limb right external capsule (right PCA territory) and also the right corona radiata (right MCA territory). No mass effect or hemorrhage. 2. Superimposed chronic lacunar infarcts in the left corona radiata and left basal ganglia (left MCA territory). 3. In light of the above in the patient's age, consider a source of cardiac or paradoxical emboli,  such as PFO. And otherwise consider risk factors for accelerated small vessel disease.      2D Echocardiogram  - Mild LV chamber dilatation with severe LVH, LVEF 40-45%. Grade 1diastolic dysfunction with increased filling pressures. Considerhypertrophic cardiomyopathy or severe hypertensive heart disease. Mild to moderate left atrial enlargement. Moderate aortic regurgitation. Trivial tricuspid regurgitation, unable to assessPASP. Cannot exclude PFO - possible small left to right atrialshunt noted on some views. Trivial pericardial effusion.  LE venous doppler There is no DVT or SVT noted in the bilateral lower extremities.   Renal artery [place] pending    TEE  pending    PHYSICAL EXAM  Temp:  [97.4 F (36.3 C)-98.5 F (36.9 C)] 98.3 F (36.8 C) (05/09 0950) Pulse Rate:  [56-79] 56 (05/09 0950) Resp:  [16-20] 18 (05/09 0950) BP: (186-214)/(69-83) 186/69 mmHg (05/09 0950) SpO2:  [92 %-100 %] 92 % (05/09 0950)  General - Well nourished, well developed, in no apparent distress.  Ophthalmologic - Sharp disc margins OU.  Cardiovascular - Regular rate and rhythm with no murmur.  Neck - supple, no carotid bruits  Mental Status -  Level of arousal and orientation to time, place, and person were intact. Language including expression, naming, repetition, comprehension was assessed and found intact.  Cranial Nerves II - XII - II - Visual field intact OU. III, IV, VI - Extraocular movements intact. V - Facial sensation decreased on the left. VII - mild left facial droop. VIII - Hearing & vestibular intact bilaterally. X - Palate elevates symmetrically. XI - Chin turning & shoulder shrug intact bilaterally. XII - Tongue protrusion intact.  Motor Strength - The patient's strength was 3+/5 LUE and 2/5 LLE, good strength on the right.  Bulk was normal and fasciculations were absent.   Motor Tone - Muscle tone was assessed at the neck and appendages and was normal.  Reflexes - The patient's reflexes were symmetrical in all extremities and she had no pathological reflexes.  Sensory - Light touch, temperature/pinprick were assessed and were decreased on the left upper and lower extremities    Coordination - The patient had ataxia on the left FTN but not out of proportion to the weakness.  Tremor was absent.  Gait and Station - not able to test due to weakness.   ASSESSMENT/PLAN [name] is a 44 y.o. female with history of HTN not on meds and current smoker admitted for left-sided hemiparesis. Symptoms and changed.    Stroke:  Non-dominant right thalamus and corona radiata separated infarcts, still consistent with small  vessel disease due to significant stroke risk factors. However, embolic stroke cannot be completely ruled out.  MRI  right thalamus and right corona radiata separated 2 infarcts  CTA head and neck showed left cavernous ICA high-grade stenosis  LE venous Doppler negative  2D Echo  EF 40-45%  TEE pending.  Renal artery ultrasound pending  LDL 113, not at goal  HgbA1c 5.8, WNL  Heparin subcutaneous for VTE prophylaxis  Diet NPO time specified   Aspirin 325 prior to admission, now on aspirin 81 mg orally every day and clopidogrel 75 mg orally every day for intracranial stenosis. Recommend to continue dural antiplatelet for 3 months and then Plavix alone.  Patient counseled to be compliant with her antithrombotic medications  Ongoing aggressive stroke risk factor management  Hypertension, malignant  Home meds:  None  Permissive hypertension (OK if <220/120) for 24-48 hours post stroke and then gradually normalized within 5-7 days.  Patient blood  pressure still high  Currently on lisinopril 20 bid and increase metoprolol to 50mg  bid  Unstable  Patient counseled to be compliant with her blood pressure medications  Renal artery ultrasound pending  Secondary malignant hypertension labs pending  Hyperlipidemia  Home meds:  None   Currently on Lipitor 10  LDL 113, goal < 70  Continue statin at discharge  Tobacco abuse  Current smoker  Smoking cessation counseling provided  Pt is willing to quit  THC abuse  THC cessation counseling provided  Pt is willing to quit  Hospital day # Brandy Roberts for Pager information 10/09/2014 12:52 PM   44 yo F with hx of HTN not meds and smoker and THC user admitted for right thalamus and corona radiata stroke. BP very high and on po meds, still fluctuating, need closely adjust meds. Stroke felt to be small vessel disease, due to multiple risk factor. EF 40-45%, cardiology on board,  recommend TEE. Currently negative DVT, waiting for TEE, renal A doppler , PT/OT and placement.  Rosalin Hawking, MD PhD Stroke Neurology 10/09/2014 5:33 PM    To contact Stroke Continuity provider, please refer to [email or web address] After hours, contact General Neurology

## 2014-10-09 NOTE — Progress Notes (Signed)
VASCULAR LAB PRELIMINARY  PRELIMINARY  PRELIMINARY  PRELIMINARY  Bilateral lower extremity venous Dopplers completed.    Preliminary report:  There is no DVT or SVT noted in the bilateral lower extremities.   Hayze Gazda, RVT 10/09/2014, 11:30 AM

## 2014-10-09 NOTE — Interval H&P Note (Signed)
History and Physical Interval Note:  10/09/2014 2:15 PM  Brandy Roberts  has presented today for surgery, with the diagnosis of stroke  The various methods of treatment have been discussed with the patient and family. After consideration of risks, benefits and other options for treatment, the patient has consented to  Procedure(s): TRANSESOPHAGEAL ECHOCARDIOGRAM (TEE) (N/A) as a surgical intervention .  The patient's history has been reviewed, patient examined, no change in status, stable for surgery.  I have reviewed the patient's chart and labs.  Questions were answered to the patient's satisfaction.     Kellyn Mansfield

## 2014-10-09 NOTE — Op Note (Signed)
INDICATIONS: stroke  PROCEDURE:   Informed consent was obtained prior to the procedure. The risks, benefits and alternatives for the procedure were discussed and the patient comprehended these risks.  Risks include, but are not limited to, cough, sore throat, vomiting, nausea, somnolence, esophageal and stomach trauma or perforation, bleeding, low blood pressure, aspiration, pneumonia, infection, trauma to the teeth and death.    After a procedural time-out, the oropharynx was anesthetized with 20% benzocaine spray. The patient was given 5 mg versed and 50 mcg fentanyl and 25 mg diphenhydramine IV for moderate sedation.   The transesophageal probe was inserted in the esophagus and stomach without difficulty and multiple views were obtained.  The patient was kept under observation until the patient left the procedure room.  The patient left the procedure room in stable condition.   Agitated microbubble saline contrast was administered.  COMPLICATIONS:    There were no immediate complications.  FINDINGS:  Hypertrophic left ventricle with mildly decreased LVEF 40-45% due to global hypokinesis. Trileaflet aortic valve with a tiny irregularity, cannot exclude a small vegetation. Moderate-to-severe aortic insufficiency with a commisural jet that has a highly eccentric direction towards the mitral valve.  RECOMMENDATIONS:   Blood cultures for possible infective endocarditis of the aortic valve, although a clear cut large vegetation is not seen. Consider follow up TEE in 2-3 weeks.  Time Spent Directly with the Patient:  60 minutes   Lanijah Warzecha 10/09/2014, 2:42 PM

## 2014-10-09 NOTE — Progress Notes (Addendum)
Rehab admissions - I met with pt this am in follow up to MD rehab consult to explain the possibility of inpatient rehab. Further questions were answered and informational brochures were given. Pt is interested in pursuing inpatient rehab and asked if I could also contact her son and two sisters to share information about our rehab program.  Rehab MD is recommending that pt have supervision at the end of a possible stay. Pt shared that her son works in the evenings and that possibly one of her sisters could stay with her. I called and spoke with pt's son who was in support of pursuing inpatient rehab. Initial questions were answered. I left a voicemail with one sister named Pharmacologist. The voicemail was full for pt's other sister named Levada Dy.  Pt has [employer] and we will need insurance authorization to consider a possible inpatient rehab admission. I will open her case with insurance to begin this process.   Please call me with any questions. Thanks.  Nanetta Batty, PT Rehabilitation Admissions Coordinator [phone]   Addendum: I did receive a call back from pt's sister named Starlett and she is also in support of inpatient rehab. She stated that she and her sisters would work on a plan to provide needed supervision assistance after a possible rehab admit.

## 2014-10-09 NOTE — Progress Notes (Signed)
  Echocardiogram Echocardiogram Transesophageal has been performed.  Brandy Roberts 10/09/2014, 2:55 PM

## 2014-10-09 NOTE — Progress Notes (Signed)
[facility]                                                                              Patient Demographics  Brandy Roberts, is a 44 y.o. female, DOB - 11/27/1970, WYO:[id number]  Admit date - 10/06/2014   Admitting Physician Etta Quill, DO  Outpatient Primary MD for the patient is Default, Provider, MD  LOS - 3   Chief Complaint  Patient presents with  . Leg Pain      HPI on 10/06/2014 by Dr. Jennette Kettle [name] is a 44 y.o. female who presents to the ED with c/o leg pain and stumbling since she got up this morning. Patient is unable to walk straight since that time. LKW was last night. No visual changes, leg feels weak as well. No history of similar symptoms.  Assessment & Plan   Acute CVA, likely embolic -CT head: no acute intracranial abnormality  -MRI brain: Small acute lacunar infarcts, lateral right thalamus/posterior limb right external capsule, right corona radiata; chronic lacunar infarcts -Echocardiogram 77-41%, Grade 1 diastolic dysfunction -Lower extremity doppler pending -LDL 113, continue statin -hemoglobin A1c pending -PT consulted and recommended CIR -Neurology consulted and appreciated, given patient's echocardiogram findings, neurology feels TEE is now appropriate.   -Cardiology consulted and appreciated -TEE pending -Continue aspirin and plavix for 3 months, then plavix alone  Uncontrolled/Accelerated Hypertension -Patient admits to not taking her medications for hypertension because they made her feel sick- she was taking coreg and HCTZ -Will continue to monitor -Allow for permissive HTN in the setting of stroke (24-48hrs)  -Hydralazine PRN -Continue lisinopril and metoprolol -Cardiology consulted and appreicated  New Systolic/Diastolic CHF -Currently compensated -Will monitor daily weights and intake/output -Likely secondary to uncontrolled HTN -Cardiology consulted and appreicated  Polysubstance abuse/Tobacco  abuse -UDS: positive for opiates and THC  -Patient counseled on cessation  Murmur -Patient states she was diagnosed at a young age with a murmur. -TEE pending  Code Status: Full  Family Communication: family at bedside  Disposition Plan: Admitted, pending TEE and possible CIR  Time Spent in minutes   30 minutes  Procedures  Echocardiogram  Consults   Neurology Cardiology Inpatient rehab  DVT Prophylaxis  heparin  Lab Results  Component Value Date   PLT 180 10/06/2014    Medications  Scheduled Meds: . aspirin EC  81 mg Oral Daily  . atorvastatin  10 mg Oral q1800  . clopidogrel  75 mg Oral Daily  . heparin  5,000 Units Subcutaneous 3 times per day  . lisinopril  20 mg Oral BID  . metoprolol tartrate  25 mg Oral BID   Continuous Infusions:  PRN Meds:.hydrALAZINE, ibuprofen  Antibiotics    Anti-infectives    None        Subjective:   [name] seen and examined today. Patient is anxious about getting a TEE.  She currently  Denies headache, dizziness, chest pain, shortness of breath, abdominal pain. Patient states that she will stop smoking now. She will take better care of herself.  Objective:   Filed Vitals:   10/08/14 2308 10/09/14 0203 10/09/14 0601 10/09/14 0950  BP: 214/79 189/78 206/74 186/69  Pulse: 63 64  60 56  Temp:  98.2 F (36.8 C) 97.4 F (36.3 C) 98.3 F (36.8 C)  TempSrc:  Oral Oral Oral  Resp:  20 20 18   Height:      Weight:      SpO2:  100% 96% 92%    Wt Readings from Last 3 Encounters:  10/07/14 83.6 kg (184 lb 4.9 oz)  08/17/13 91.627 kg (202 lb)     Intake/Output Summary (Last 24 hours) at 10/09/14 1119 Last data filed at 10/09/14 0800  Gross per 24 hour  Intake    240 ml  Output      0 ml  Net    240 ml    Exam  General: Well developed, well nourished, No distress  HEENT: NCAT,mucous membranes moist.   Cardiovascular: S1 S2 auscultated, 2/6 SEM. RRR  Respiratory: Clear to auscultation   Abdomen:  Soft, nontender, nondistended, + bowel sounds  Extremities: warm dry without cyanosis clubbing or edema  Neuro: AAOx3, Left sided weakness  Data Review   Micro Results No results found for this or any previous visit (from the past 240 hour(s)).  Radiology Reports Ct Angio Head W/cm &/or Wo Cm  10/07/2014   CLINICAL DATA:  Stroke with left leg pain.  EXAM: CT ANGIOGRAPHY HEAD AND NECK  TECHNIQUE: Multidetector CT imaging of the head and neck was performed using the standard protocol during bolus administration of intravenous contrast. Multiplanar CT image reconstructions and MIPs were obtained to evaluate the vascular anatomy. Carotid stenosis measurements (when applicable) are obtained utilizing NASCET criteria, using the distal internal carotid diameter as the denominator.  CONTRAST:  46mL OMNIPAQUE IOHEXOL 350 MG/ML SOLN  COMPARISON:  10/06/2014  FINDINGS: CT HEAD  Skull and Sinuses:Negative for fracture or destructive process. The mastoids, middle ears, and imaged paranasal sinuses are clear.  Orbits: No acute abnormality.  Brain: No evidence of acute infarction, hemorrhage, hydrocephalus, or mass lesion/mass effect. A remote lacunar infarct is again noted in the left centrum semiovale. Known acute infarcts in the right hemisphere are not visualized. No hemorrhagic transformation. No hydrocephalus or shift.  CTA NECK  Aortic arch: Limited imaging of the arch. No evidence of dissection or notable plaque. There is a three-vessel origin without origin stenosis.  Right carotid system: There is no evidence of atherosclerosis, cervical carotid dissection, or stenosis. Tortuous ICA at the skullbase.  Left carotid system: There is no evidence of atherosclerosis, cervical carotid dissection, or stenosis. Tortuous ICA at the skullbase.  Vertebral arteries:Patent bilateral proximal subclavian arteries. No focal stenosis of the vertebral arteries. There is mild luminal beading of the bilateral V3 segments,  without evidence of superimposed dissection.  Skeleton: No contributory findings.  CTA HEAD  Anterior circulation: Anterior communicating artery is present. Balanced carotid arteries. Posterior communicating arteries are not clearly visualized. The bilateral petrous and cavernous carotids are diffusely narrowed, without abrupt caliber change or cervical carotid involvement suggestive of dissection. This is likely atherosclerotic as there is patchy mural calcification. Stenosis is up to moderate at the petrouscavernous junction on the left.  2 mm inferior outpouching from the right supraclinoid carotid artery without visible associated vessel.  Posterior circulation: Markedly the tortuous V4 segments. No major vessel occlusion, notable stenosis, or aneurysm.  Venous sinuses: Patent  Anatomic variants: None of significance  Delayed phase: No parenchymal enhancement  IMPRESSION: 1. No acute arterial findings. 2. No evidence of infarct progression since yesterday. No hemorrhagic conversion. 3. Diffuse bilateral petrous and cavernous carotid stenosis, likely atherosclerotic. Narrowing is  up to moderate at the left petrous cavernous junction. 4. Mild beading of the bilateral V3 segments, question fibromuscular dysplasia. 5. 2 mm outpouching from the right supraclinoid carotid, aneurysm or infundibulum with parent vessel below the resolution of CTA. 6. No arterial stenosis in the neck.   Electronically Signed   By: Monte Fantasia M.D.   On: 10/07/2014 13:01   Ct Head Wo Contrast  10/06/2014   CLINICAL DATA:  Leg pain, left-sided weakness  EXAM: CT HEAD WITHOUT CONTRAST  TECHNIQUE: Contiguous axial images were obtained from the base of the skull through the vertex without intravenous contrast.  COMPARISON:  12/26/2008  FINDINGS: No skull fracture is noted. Paranasal sinuses and mastoid air cells are unremarkable. No intracranial hemorrhage, mass effect or midline shift. No acute cortical infarction. No mass lesion is  noted on this unenhanced scan. Ventricular size is stable from prior exam. No intra or extra-axial fluid collection. The gray and white-matter differentiation is preserved.  IMPRESSION: No acute intracranial abnormality. No definite acute cortical infarction.   Electronically Signed   By: Lahoma Crocker M.D.   On: 10/06/2014 13:18   Ct Angio Neck W/cm &/or Wo/cm  10/07/2014   CLINICAL DATA:  Stroke with left leg pain.  EXAM: CT ANGIOGRAPHY HEAD AND NECK  TECHNIQUE: Multidetector CT imaging of the head and neck was performed using the standard protocol during bolus administration of intravenous contrast. Multiplanar CT image reconstructions and MIPs were obtained to evaluate the vascular anatomy. Carotid stenosis measurements (when applicable) are obtained utilizing NASCET criteria, using the distal internal carotid diameter as the denominator.  CONTRAST:  54mL OMNIPAQUE IOHEXOL 350 MG/ML SOLN  COMPARISON:  10/06/2014  FINDINGS: CT HEAD  Skull and Sinuses:Negative for fracture or destructive process. The mastoids, middle ears, and imaged paranasal sinuses are clear.  Orbits: No acute abnormality.  Brain: No evidence of acute infarction, hemorrhage, hydrocephalus, or mass lesion/mass effect. A remote lacunar infarct is again noted in the left centrum semiovale. Known acute infarcts in the right hemisphere are not visualized. No hemorrhagic transformation. No hydrocephalus or shift.  CTA NECK  Aortic arch: Limited imaging of the arch. No evidence of dissection or notable plaque. There is a three-vessel origin without origin stenosis.  Right carotid system: There is no evidence of atherosclerosis, cervical carotid dissection, or stenosis. Tortuous ICA at the skullbase.  Left carotid system: There is no evidence of atherosclerosis, cervical carotid dissection, or stenosis. Tortuous ICA at the skullbase.  Vertebral arteries:Patent bilateral proximal subclavian arteries. No focal stenosis of the vertebral arteries. There is  mild luminal beading of the bilateral V3 segments, without evidence of superimposed dissection.  Skeleton: No contributory findings.  CTA HEAD  Anterior circulation: Anterior communicating artery is present. Balanced carotid arteries. Posterior communicating arteries are not clearly visualized. The bilateral petrous and cavernous carotids are diffusely narrowed, without abrupt caliber change or cervical carotid involvement suggestive of dissection. This is likely atherosclerotic as there is patchy mural calcification. Stenosis is up to moderate at the petrouscavernous junction on the left.  2 mm inferior outpouching from the right supraclinoid carotid artery without visible associated vessel.  Posterior circulation: Markedly the tortuous V4 segments. No major vessel occlusion, notable stenosis, or aneurysm.  Venous sinuses: Patent  Anatomic variants: None of significance  Delayed phase: No parenchymal enhancement  IMPRESSION: 1. No acute arterial findings. 2. No evidence of infarct progression since yesterday. No hemorrhagic conversion. 3. Diffuse bilateral petrous and cavernous carotid stenosis, likely atherosclerotic. Narrowing is up to  moderate at the left petrous cavernous junction. 4. Mild beading of the bilateral V3 segments, question fibromuscular dysplasia. 5. 2 mm outpouching from the right supraclinoid carotid, aneurysm or infundibulum with parent vessel below the resolution of CTA. 6. No arterial stenosis in the neck.   Electronically Signed   By: Monte Fantasia M.D.   On: 10/07/2014 13:01   Mr Brain Wo Contrast  10/06/2014   CLINICAL DATA:  44 year old female with left lower extremity pain and weakness. Initial encounter.  EXAM: MRI HEAD WITHOUT CONTRAST  TECHNIQUE: Multiplanar, multiecho pulse sequences of the brain and surrounding structures were obtained without intravenous contrast.  COMPARISON:  Head CT without contrast 1315 hours today.  FINDINGS: There is a 12 mm area of restricted diffusion in  the lateral right thalamus near the posterior limb of the right internal capsule. Associated T2 and FLAIR hyperintensity, but no hemorrhage or mass effect.  Also in the right hemisphere at the corona radiata just above the low right lateral ventricle there is a small 6-8 mm focus of restricted diffusion. See series 5, image 18. This level also demonstrates mild T2 and FLAIR hyperintensity with no hemorrhage or mass effect.  In the left corona radiata there is signal abnormality most resembling a chronic lacunar infarct (series 9, image 18). Several other scattered bilateral cerebral white matter T2 and FLAIR hyperintense foci are nonspecific. In the left globus pallidus and anterior limb of the external capsule there are signal changes suggesting chronic lacunar infarcts. There is hemosiderin associated with the former.  The brainstem and cerebellum are within normal limits. Major intracranial vascular flow voids are within normal limits.  No midline shift, mass effect, evidence of mass lesion, ventriculomegaly, extra-axial collection or acute intracranial hemorrhage. Cervicomedullary junction and pituitary are within normal limits. Negative visualized cervical spine. Visible internal auditory structures appear normal. Trace left mastoid fluid. Negative nasopharynx. Trace paranasal sinus mucosal thickening. Visualized orbit soft tissues are within normal limits. Visualized scalp soft tissues are within normal limits. Visualized bone marrow signal is within normal limits.  IMPRESSION: 1. Small acute lacunar infarcts in the lateral right thalamus/posterior limb right external capsule (right PCA territory) and also the right corona radiata (right MCA territory). No mass effect or hemorrhage. 2. Superimposed chronic lacunar infarcts in the left corona radiata and left basal ganglia (left MCA territory). 3. In light of the above in the patient's age, consider a source of cardiac or paradoxical emboli, such as PFO. And  otherwise consider risk factors for accelerated small vessel disease.   Electronically Signed   By: Genevie Ann M.D.   On: 10/06/2014 20:25    CBC  Recent Labs Lab 10/06/14 1153 10/06/14 1207  WBC 5.6  --   HGB 14.3 16.3*  HCT 43.5 48.0*  PLT 180  --   MCV 88.6  --   MCH 29.1  --   MCHC 32.9  --   RDW 14.5  --   LYMPHSABS 2.5  --   MONOABS 0.6  --   EOSABS 0.1  --   BASOSABS 0.0  --     Chemistries   Recent Labs Lab 10/06/14 1153 10/06/14 1207  NA 140 142  K 3.6 3.8  CL 105 102  CO2 26  --   GLUCOSE 106* 107*  BUN 17 24*  CREATININE 1.02* 0.90  CALCIUM 9.3  --   AST 27  --   ALT 19  --   ALKPHOS 68  --   BILITOT 0.6  --    ------------------------------------------------------------------------------------------------------------------  estimated creatinine clearance is 86 mL/min (by C-G formula based on Cr of 0.9). ------------------------------------------------------------------------------------------------------------------ No results for input(s): HGBA1C in the last 72 hours. ------------------------------------------------------------------------------------------------------------------  Recent Labs  10/07/14 0613  CHOL 179  HDL 45  LDLCALC 113*  TRIG 105  CHOLHDL 4.0   ------------------------------------------------------------------------------------------------------------------ No results for input(s): TSH, T4TOTAL, T3FREE, THYROIDAB in the last 72 hours.  Invalid input(s): FREET3 ------------------------------------------------------------------------------------------------------------------ No results for input(s): VITAMINB12, FOLATE, FERRITIN, TIBC, IRON, RETICCTPCT in the last 72 hours.  Coagulation profile  Recent Labs Lab 10/06/14 1153  INR 1.08    No results for input(s): DDIMER in the last 72 hours.  Cardiac Enzymes No results for input(s): CKMB, TROPONINI, MYOGLOBIN in the last 168 hours.  Invalid input(s):  CK ------------------------------------------------------------------------------------------------------------------ Invalid input(s): POCBNP    Rileyann Florance D.O. on 10/09/2014 at 11:19 AM  Between 7am to 7pm - Pager - [phone]  After 7pm go to [email or web address] - password [email or web address]  And look for the night coverage person covering for me after hours  [facility] Office  [phone]

## 2014-10-09 NOTE — Progress Notes (Signed)
Subjective:  Still some gait difficulties.  Tolerated TEE well.  Findings as noted in chart.  Blood pressure remains elevated.  Objective:  Vital Signs in the last 24 hours: BP 183/70 mmHg  Pulse 72  Temp(Src) 98.4 F (36.9 C) (Oral)  Resp 20  Ht 5\' 6"  (1.676 m)  Wt 83.6 kg (184 lb 4.9 oz)  BMI 29.76 kg/m2  SpO2 100%  LMP 10/03/2014  Physical Exam: Pleasant overweight black female in no acute distress Lungs:  Clear  Cardiac:  Regular rhythm, normal S1 and S2, no S3, 2/6 systolic murmur across the aortic valve, 2/6 diastolic murmur noted Abdomen:  Soft, nontender, no masses Extremities:  No edema present  Intake/Output from previous day: 05/08 0701 - 05/09 0700 In: 360 [P.O.:360] Out: -  Weight Filed Weights   10/06/14 1032 10/07/14 0040  Weight: 91.627 kg (202 lb) 83.6 kg (184 lb 4.9 oz)   Telemetry: Sinus rhythm  Assessment/Plan:   1.  Recent stroke in different territories still likely due to small vessel disease and uncontrolled hypertension 2.  Moderate to severe eccentric aortic regurgitation with small nodule seen on aortic valve.  There is no clinical evidence of endocarditis and she has had blood cultures done earlier.  This does not appear to be endocarditis and I suspect this may be an unrelated finding but she does have eccentric aortic regurgitation.  This will need to be followed closely with serial echoes.  No evidence of heart failure or need for intervention at this time. 3.  Cardiomyopathy likely due to uncontrolled hypertension 4.  Obesity [place] 5.  Hypertensive heart disease  Recommendations:  She is on Ace inhibitors.  I would add hydralazine when stable from a cerebrovascular viewpoint to try to normalize her blood pressure and also to help with her decreased LV function.  Would like to add 25 mg 3 times a day initially and titrated up.  Avoid use of nonsteroidal anti-inflammatory agents as they may blood pressure.  Consider use of spironolactone to  help with blood pressure control and for congestive heart failure.   Kerry Hough  MD [facility]  10/09/2014, 9:42 PM

## 2014-10-09 NOTE — Consult Note (Signed)
Physical Medicine and Rehabilitation Consult Reason for Consult: Small acute lacunar infarct lateral right thalamus posterior limb right external capsule/right PCA territory Referring Physician: [facility]   HPI: Brandy Roberts is a 44 y.o. right handed female with history of hypertension and tobacco abuse. 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 Admitted 10/06/2014 with decreased balance and left-sided weakness. Blood pressure 196/88. MRI of the brain showed acute lacunar infarct lateral right thalamus posterior limb right external capsule and also the right corona radiata. Superimposed chronic lacunar infarcts in the left corona radiata and left basal ganglia. Echocardiogram with ejection fraction of 99% grade 1 diastolic dysfunction. CT angiogram head and neck with no acute arterial findings. Patient did not receive TPA. Neurology consulted maintain on aspirin and Plavix for CVA prophylaxis. Subcutaneous heparin for DVT prophylaxis. TEE is pending. Physical and occupational therapy evaluations completed with recommendations of physical medicine rehabilitation consult.   Review of Systems  Neurological: Positive for dizziness and weakness.  All other systems reviewed and are negative.  Past Medical History  Diagnosis Date  . Hypertension    Past Surgical History  Procedure Laterality Date  . Cesarean section    . Tonsillectomy    . Ganglion cyst excision     History reviewed. No pertinent family history. Social History:  reports that she has been smoking.  She has never used smokeless tobacco. She reports that she does not drink alcohol or use illicit drugs. Allergies: No Known Allergies Medications Prior to Admission  Medication Sig Dispense Refill  . oxyCODONE-acetaminophen (PERCOCET) 5-325 MG per tablet Take 2 tablets by mouth at bedtime as needed for severe pain. 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 cues for technique. Ambulation/Gait Ambulation/Gait assistance: Mod assist Ambulation Distance (Feet): 35 Feet (and 10') Assistive device: Rolling walker (2 wheeled) General Gait Details: facilitation and cues left hip control in stance, cues for left heel strike, foot clearance with facilitation for increased right weight shift in swing Gait Pattern/deviations: Step-through pattern, Decreased dorsiflexion - left, Shuffle, Decreased stance time - left, Narrow base of support, Staggering left    ADL: ADL Overall ADL's : Needs assistance/impaired Grooming: Set up, Supervision/safety, Sitting Upper Body Dressing : Minimal assistance, Sitting Lower Body Dressing: Moderate assistance, Sit to/from stand Toilet Transfer: Moderate assistance, Ambulation, RW (chair; Min assist for sit to stand transfer.) Functional mobility during ADLs: Moderate assistance, Rolling walker General ADL Comments: Educated on  dressing technique. Encouraged pt to be using left hand for activities and gave her some suggestions.   Cognition: Cognition Overall Cognitive Status: Within Functional Limits for tasks  assessed Orientation Level: Oriented X4 Cognition Arousal/Alertness: Awake/alert Behavior During Therapy: WFL for tasks assessed/performed Overall Cognitive Status: Within Functional Limits for tasks assessed  Blood pressure 189/78, pulse 64, temperature 98.2 F (36.8 C), temperature source Oral, resp. rate 20, height 5\' 6"  (1.676 m), weight 83.6 kg (184 lb 4.9 oz), last menstrual period 10/03/2014, SpO2 100 %. Physical Exam  Vitals reviewed. Constitutional: She appears well-developed.  HENT:  Head: Normocephalic.  Eyes: EOM are normal.  Neck: Normal range of motion. Neck supple. No thyromegaly present.  Cardiovascular: Normal rate and regular rhythm.   Respiratory: Effort normal and breath sounds normal. No respiratory distress.  GI: Soft. Bowel sounds are normal. She exhibits no distension.  Neurological: She is alert.  Patient is a bit anxious. Oriented to person place and date of birth. Follows all commands. Fair awareness of deficits. Left facial weakness. LUE 3 to 3+/5 prox to distal. LLE: 1+ HF to 2+/3- at ankles. Sensation 1-/2 Left side---cannot sense painful stim. Delayed processing and initiation in all 4 limbs. Fair insight and awareness.   Skin: Skin is warm and dry.  Psychiatric:  Flat, generally cooperative.     No results found for this or any previous visit (from the past 24 hour(s)). Ct Angio Head W/cm &/or Wo Cm  10/07/2014   CLINICAL DATA:  Stroke with left leg pain.  EXAM: CT ANGIOGRAPHY HEAD AND NECK  TECHNIQUE: Multidetector CT imaging of the head and neck was performed using the standard protocol during bolus administration of intravenous contrast. Multiplanar CT image reconstructions and MIPs were obtained to evaluate the vascular anatomy. Carotid stenosis measurements (when  applicable) are obtained utilizing NASCET criteria, using the distal internal carotid diameter as the denominator.  CONTRAST:  5mL OMNIPAQUE IOHEXOL 350 MG/ML SOLN  COMPARISON:  10/06/2014  FINDINGS: CT HEAD  Skull and Sinuses:Negative for fracture or destructive process. The mastoids, middle ears, and imaged paranasal sinuses are clear.  Orbits: No acute abnormality.  Brain: No evidence of acute infarction, hemorrhage, hydrocephalus, or mass lesion/mass effect. A remote lacunar infarct is again noted in the left centrum semiovale. Known acute infarcts in the right hemisphere are not visualized. No hemorrhagic transformation. No hydrocephalus or shift.  CTA NECK  Aortic arch: Limited imaging of the arch. No evidence of dissection or notable plaque. There is a three-vessel origin without origin stenosis.  Right carotid system: There is no evidence of atherosclerosis, cervical carotid dissection, or stenosis. Tortuous ICA at the skullbase.  Left carotid system: There is no evidence of atherosclerosis, cervical carotid dissection, or stenosis. Tortuous ICA at the skullbase.  Vertebral arteries:Patent bilateral proximal subclavian arteries. No focal stenosis of the vertebral arteries. There is mild luminal beading of the bilateral V3 segments, without evidence of superimposed dissection.  Skeleton: No contributory findings.  CTA HEAD  Anterior circulation: Anterior communicating artery is present. Balanced carotid arteries. Posterior communicating arteries are not clearly visualized. The bilateral petrous and cavernous carotids are diffusely narrowed, without abrupt caliber change or cervical carotid involvement suggestive of dissection. This is likely atherosclerotic as there is patchy mural calcification. Stenosis is up to moderate at the petrouscavernous junction on the left.  2 mm inferior outpouching from the right supraclinoid carotid artery without visible associated vessel.  Posterior circulation: Markedly the  tortuous V4 segments. No major vessel occlusion, notable stenosis, or aneurysm.  Venous sinuses: Patent  Anatomic variants: None of significance  Delayed phase: No parenchymal enhancement  IMPRESSION: 1. No acute arterial findings. 2. No evidence of  infarct progression since yesterday. No hemorrhagic conversion. 3. Diffuse bilateral petrous and cavernous carotid stenosis, likely atherosclerotic. Narrowing is up to moderate at the left petrous cavernous junction. 4. Mild beading of the bilateral V3 segments, question fibromuscular dysplasia. 5. 2 mm outpouching from the right supraclinoid carotid, aneurysm or infundibulum with parent vessel below the resolution of CTA. 6. No arterial stenosis in the neck.   Electronically Signed   By: Monte Fantasia M.D.   On: 10/07/2014 13:01   Ct Angio Neck W/cm &/or Wo/cm  10/07/2014   CLINICAL DATA:  Stroke with left leg pain.  EXAM: CT ANGIOGRAPHY HEAD AND NECK  TECHNIQUE: Multidetector CT imaging of the head and neck was performed using the standard protocol during bolus administration of intravenous contrast. Multiplanar CT image reconstructions and MIPs were obtained to evaluate the vascular anatomy. Carotid stenosis measurements (when applicable) are obtained utilizing NASCET criteria, using the distal internal carotid diameter as the denominator.  CONTRAST:  7mL OMNIPAQUE IOHEXOL 350 MG/ML SOLN  COMPARISON:  10/06/2014  FINDINGS: CT HEAD  Skull and Sinuses:Negative for fracture or destructive process. The mastoids, middle ears, and imaged paranasal sinuses are clear.  Orbits: No acute abnormality.  Brain: No evidence of acute infarction, hemorrhage, hydrocephalus, or mass lesion/mass effect. A remote lacunar infarct is again noted in the left centrum semiovale. Known acute infarcts in the right hemisphere are not visualized. No hemorrhagic transformation. No hydrocephalus or shift.  CTA NECK  Aortic arch: Limited imaging of the arch. No evidence of dissection or notable  plaque. There is a three-vessel origin without origin stenosis.  Right carotid system: There is no evidence of atherosclerosis, cervical carotid dissection, or stenosis. Tortuous ICA at the skullbase.  Left carotid system: There is no evidence of atherosclerosis, cervical carotid dissection, or stenosis. Tortuous ICA at the skullbase.  Vertebral arteries:Patent bilateral proximal subclavian arteries. No focal stenosis of the vertebral arteries. There is mild luminal beading of the bilateral V3 segments, without evidence of superimposed dissection.  Skeleton: No contributory findings.  CTA HEAD  Anterior circulation: Anterior communicating artery is present. Balanced carotid arteries. Posterior communicating arteries are not clearly visualized. The bilateral petrous and cavernous carotids are diffusely narrowed, without abrupt caliber change or cervical carotid involvement suggestive of dissection. This is likely atherosclerotic as there is patchy mural calcification. Stenosis is up to moderate at the petrouscavernous junction on the left.  2 mm inferior outpouching from the right supraclinoid carotid artery without visible associated vessel.  Posterior circulation: Markedly the tortuous V4 segments. No major vessel occlusion, notable stenosis, or aneurysm.  Venous sinuses: Patent  Anatomic variants: None of significance  Delayed phase: No parenchymal enhancement  IMPRESSION: 1. No acute arterial findings. 2. No evidence of infarct progression since yesterday. No hemorrhagic conversion. 3. Diffuse bilateral petrous and cavernous carotid stenosis, likely atherosclerotic. Narrowing is up to moderate at the left petrous cavernous junction. 4. Mild beading of the bilateral V3 segments, question fibromuscular dysplasia. 5. 2 mm outpouching from the right supraclinoid carotid, aneurysm or infundibulum with parent vessel below the resolution of CTA. 6. No arterial stenosis in the neck.   Electronically Signed   By: Monte Fantasia M.D.   On: 10/07/2014 13:01    Assessment/Plan: Diagnosis: right thalamic, PLIC infarct 1. Does the need for close, 24 hr/day medical supervision in concert with the patient's rehab needs make it unreasonable for this patient to be served in a less intensive setting? Yes 2. Co-Morbidities requiring supervision/potential complications: htn, AR 3.  Due to bladder management, bowel management, safety, skin/wound care, disease management, medication administration and patient education, does the patient require 24 hr/day rehab nursing? Yes 4. Does the patient require coordinated care of a physician, rehab nurse, PT (1-2 hrs/day, 5 days/week), OT (1-2 hrs/day, 5 days/week) and SLP (1-2 hrs/day, 5 days/week) to address physical and functional deficits in the context of the above medical diagnosis(es)? Yes Addressing deficits in the following areas: balance, endurance, locomotion, strength, transferring, bowel/bladder control, bathing, dressing, feeding, grooming, toileting, cognition and psychosocial support 5. Can the patient actively participate in an intensive therapy program of at least 3 hrs of therapy per day at least 5 days per week? Yes 6. The potential for patient to make measurable gains while on inpatient rehab is excellent 7. Anticipated functional outcomes upon discharge from inpatient rehab are modified independent and supervision  with PT, modified independent and supervision with OT, modified independent and supervision with SLP. 8. Estimated rehab length of stay to reach the above functional goals is: 14-20 days 9. Does the patient have adequate social supports and living environment to accommodate these discharge functional goals? Yes and Potentially 10. Anticipated D/C setting: Home 11. Anticipated post D/C treatments: HH therapy and Outpatient therapy 12. Overall Rehab/Functional Prognosis: excellent  RECOMMENDATIONS: This patient's condition is appropriate for continued  rehabilitative care in the following setting: CIR Patient has agreed to participate in recommended program. Yes Note that insurance prior authorization may be required for reimbursement for recommended care.  Comment: Rehab Admissions Coordinator to follow up.  Thanks,  Meredith Staggers, MD, Mellody Drown     10/09/2014

## 2014-10-09 NOTE — Clinical Social Work Note (Signed)
CSW Consult Acknowledged:   CSW received a consult for medication assistant. CSW will informed case manager regarding this consult. CSW will sign off.       [place], MSW, [place]

## 2014-10-10 ENCOUNTER — Inpatient Hospital Stay (HOSPITAL_COMMUNITY)
Admission: RE | Admit: 2014-10-10 | Discharge: 2014-10-18 | DRG: 057 | Disposition: A | Discharge status: Home / Self Care | Payer: No Typology Code available for payment source | Source: Intra-hospital | Attending: Physical Medicine & Rehabilitation | Admitting: Physical Medicine & Rehabilitation

## 2014-10-10 ENCOUNTER — Encounter (HOSPITAL_COMMUNITY): Payer: Self-pay | Admitting: Cardiovascular Disease

## 2014-10-10 DIAGNOSIS — E669 Obesity, unspecified: Secondary | ICD-10-CM | POA: Diagnosis present

## 2014-10-10 DIAGNOSIS — I351 Nonrheumatic aortic (valve) insufficiency: Secondary | ICD-10-CM | POA: Diagnosis present

## 2014-10-10 DIAGNOSIS — I6930 Unspecified sequelae of cerebral infarction: Secondary | ICD-10-CM | POA: Diagnosis present

## 2014-10-10 DIAGNOSIS — E785 Hyperlipidemia, unspecified: Secondary | ICD-10-CM | POA: Diagnosis present

## 2014-10-10 DIAGNOSIS — Z6829 Body mass index (BMI) 29.0-29.9, adult: Secondary | ICD-10-CM | POA: Diagnosis not present

## 2014-10-10 DIAGNOSIS — M79609 Pain in unspecified limb: Secondary | ICD-10-CM | POA: Diagnosis not present

## 2014-10-10 DIAGNOSIS — I429 Cardiomyopathy, unspecified: Secondary | ICD-10-CM | POA: Diagnosis present

## 2014-10-10 DIAGNOSIS — I63431 Cerebral infarction due to embolism of right posterior cerebral artery: Secondary | ICD-10-CM | POA: Diagnosis present

## 2014-10-10 DIAGNOSIS — F4320 Adjustment disorder, unspecified: Secondary | ICD-10-CM | POA: Diagnosis present

## 2014-10-10 DIAGNOSIS — I63211 Cerebral infarction due to unspecified occlusion or stenosis of right vertebral arteries: Secondary | ICD-10-CM

## 2014-10-10 DIAGNOSIS — F17210 Nicotine dependence, cigarettes, uncomplicated: Secondary | ICD-10-CM | POA: Diagnosis present

## 2014-10-10 DIAGNOSIS — M79606 Pain in leg, unspecified: Secondary | ICD-10-CM | POA: Diagnosis present

## 2014-10-10 DIAGNOSIS — I10 Essential (primary) hypertension: Secondary | ICD-10-CM

## 2014-10-10 DIAGNOSIS — I119 Hypertensive heart disease without heart failure: Secondary | ICD-10-CM | POA: Diagnosis present

## 2014-10-10 DIAGNOSIS — G8190 Hemiplegia, unspecified affecting unspecified side: Secondary | ICD-10-CM | POA: Diagnosis not present

## 2014-10-10 DIAGNOSIS — I69354 Hemiplegia and hemiparesis following cerebral infarction affecting left non-dominant side: Principal | ICD-10-CM | POA: Diagnosis present

## 2014-10-10 DIAGNOSIS — I63219 Cerebral infarction due to unspecified occlusion or stenosis of unspecified vertebral arteries: Secondary | ICD-10-CM | POA: Diagnosis not present

## 2014-10-10 DIAGNOSIS — I330 Acute and subacute infective endocarditis: Secondary | ICD-10-CM

## 2014-10-10 LAB — CREATININE, SERUM: Creatinine, Ser: 0.93 mg/dL (ref 0.44–1.00)

## 2014-10-10 LAB — CBC
HEMATOCRIT: 41.9 % (ref 36.0–46.0)
HEMOGLOBIN: 13.5 g/dL (ref 12.0–15.0)
MCH: 28 pg (ref 26.0–34.0)
MCHC: 32.2 g/dL (ref 30.0–36.0)
MCV: 86.9 fL (ref 78.0–100.0)
Platelets: 162 10*3/uL (ref 150–400)
RBC: 4.82 MIL/uL (ref 3.87–5.11)
RDW: 14.6 % (ref 11.5–15.5)
WBC: 5.3 10*3/uL (ref 4.0–10.5)

## 2014-10-10 MED ORDER — ATORVASTATIN CALCIUM 10 MG PO TABS
10.0000 mg | ORAL_TABLET | Freq: Every day | ORAL | Status: DC
  Administered 2014-10-10 – 2014-10-17 (×8): 10 mg via ORAL
  Filled 2014-10-10 (×9): qty 1

## 2014-10-10 MED ORDER — BISACODYL 5 MG PO TBEC: 10.0000 mg | DELAYED_RELEASE_TABLET | Freq: Every day | ORAL | Status: DC | PRN

## 2014-10-10 MED ORDER — ONDANSETRON HCL 4 MG/2ML IJ SOLN: 4.0000 mg | Freq: Four times a day (QID) | INTRAMUSCULAR | Status: DC | PRN

## 2014-10-10 MED ORDER — DOCUSATE SODIUM 100 MG PO CAPS: 100.0000 mg | ORAL_CAPSULE | Freq: Two times a day (BID) | ORAL | Status: DC

## 2014-10-10 MED ORDER — SPIRONOLACTONE 25 MG PO TABS
25.0000 mg | ORAL_TABLET | Freq: Every day | ORAL | Status: DC
Start: 2014-10-10 — End: 2014-10-10
  Administered 2014-10-10: 25 mg via ORAL
  Filled 2014-10-10: qty 1

## 2014-10-10 MED ORDER — LISINOPRIL 20 MG PO TABS
20.0000 mg | ORAL_TABLET | Freq: Two times a day (BID) | ORAL | Status: DC
  Administered 2014-10-10 – 2014-10-18 (×16): 20 mg via ORAL
  Filled 2014-10-10 (×18): qty 1

## 2014-10-10 MED ORDER — HYDRALAZINE HCL 25 MG PO TABS
25.0000 mg | ORAL_TABLET | Freq: Three times a day (TID) | ORAL | Status: DC
  Administered 2014-10-10 – 2014-10-11 (×3): 25 mg via ORAL
  Filled 2014-10-10 (×5): qty 1

## 2014-10-10 MED ORDER — ATORVASTATIN CALCIUM 10 MG PO TABS: 10.0000 mg | ORAL_TABLET | Freq: Every day | ORAL | Status: DC

## 2014-10-10 MED ORDER — HEPARIN SODIUM (PORCINE) 5000 UNIT/ML IJ SOLN: 5000.0000 [IU] | Freq: Three times a day (TID) | INTRAMUSCULAR | Status: DC

## 2014-10-10 MED ORDER — HYDRALAZINE HCL 25 MG PO TABS
25.0000 mg | ORAL_TABLET | Freq: Three times a day (TID) | ORAL | Status: DC
  Administered 2014-10-10: 25 mg via ORAL
  Filled 2014-10-10: qty 1

## 2014-10-10 MED ORDER — SPIRONOLACTONE 25 MG PO TABS: 25.0000 mg | ORAL_TABLET | Freq: Every day | ORAL | Status: DC

## 2014-10-10 MED ORDER — LISINOPRIL 20 MG PO TABS: 20.0000 mg | ORAL_TABLET | Freq: Two times a day (BID) | ORAL | Status: DC

## 2014-10-10 MED ORDER — METOPROLOL TARTRATE 50 MG PO TABS
50.0000 mg | ORAL_TABLET | Freq: Two times a day (BID) | ORAL | Status: DC
  Administered 2014-10-10 – 2014-10-18 (×16): 50 mg via ORAL
  Filled 2014-10-10 (×18): qty 1

## 2014-10-10 MED ORDER — ACETAMINOPHEN 325 MG PO TABS
325.0000 mg | ORAL_TABLET | ORAL | Status: DC | PRN
  Administered 2014-10-12: 650 mg via ORAL
  Filled 2014-10-10: qty 2

## 2014-10-10 MED ORDER — ASPIRIN EC 81 MG PO TBEC
81.0000 mg | DELAYED_RELEASE_TABLET | Freq: Every day | ORAL | Status: DC
  Administered 2014-10-11 – 2014-10-18 (×8): 81 mg via ORAL
  Filled 2014-10-10 (×10): qty 1

## 2014-10-10 MED ORDER — CLOPIDOGREL BISULFATE 75 MG PO TABS: 75.0000 mg | ORAL_TABLET | Freq: Every day | ORAL | Status: DC

## 2014-10-10 MED ORDER — METOPROLOL TARTRATE 50 MG PO TABS: 50.0000 mg | ORAL_TABLET | Freq: Two times a day (BID) | ORAL | Status: DC

## 2014-10-10 MED ORDER — SORBITOL 70 % SOLN
30.0000 mL | Freq: Every day | Status: DC | PRN
  Administered 2014-10-11: 30 mL via ORAL
  Filled 2014-10-10: qty 30

## 2014-10-10 MED ORDER — CLOPIDOGREL BISULFATE 75 MG PO TABS
75.0000 mg | ORAL_TABLET | Freq: Every day | ORAL | Status: DC
  Administered 2014-10-11 – 2014-10-18 (×8): 75 mg via ORAL
  Filled 2014-10-10 (×9): qty 1

## 2014-10-10 MED ORDER — HEPARIN SODIUM (PORCINE) 5000 UNIT/ML IJ SOLN
5000.0000 [IU] | Freq: Three times a day (TID) | INTRAMUSCULAR | Status: DC
  Administered 2014-10-10 – 2014-10-15 (×14): 5000 [IU] via SUBCUTANEOUS
  Filled 2014-10-10 (×17): qty 1

## 2014-10-10 MED ORDER — CLONIDINE HCL 0.1 MG PO TABS
0.1000 mg | ORAL_TABLET | Freq: Once | ORAL | Status: AC
  Administered 2014-10-10: 0.1 mg via ORAL
  Filled 2014-10-10: qty 1

## 2014-10-10 MED ORDER — SPIRONOLACTONE 25 MG PO TABS
25.0000 mg | ORAL_TABLET | Freq: Every day | ORAL | Status: DC
  Administered 2014-10-11 – 2014-10-17 (×7): 25 mg via ORAL
  Filled 2014-10-10 (×8): qty 1

## 2014-10-10 MED ORDER — DOCUSATE SODIUM 100 MG PO CAPS
100.0000 mg | ORAL_CAPSULE | Freq: Two times a day (BID) | ORAL | Status: DC
  Administered 2014-10-10 – 2014-10-14 (×4): 100 mg via ORAL
  Filled 2014-10-10 (×18): qty 1

## 2014-10-10 MED ORDER — HYDRALAZINE HCL 25 MG PO TABS: 25.0000 mg | ORAL_TABLET | Freq: Three times a day (TID) | ORAL | Status: DC

## 2014-10-10 MED ORDER — ONDANSETRON HCL 4 MG PO TABS: 4.0000 mg | ORAL_TABLET | Freq: Four times a day (QID) | ORAL | Status: DC | PRN

## 2014-10-10 NOTE — Progress Notes (Signed)
Patient is being transferred to in-patient-rehab. Report called to the receiving RN.

## 2014-10-10 NOTE — Discharge Summary (Addendum)
Physician Discharge Summary  Brandy Roberts IEP:[id number] DOB: 1971/01/21 DOA: 10/06/2014  PCP: Default, Provider, MD  Admit date: 10/06/2014 Discharge date: 10/10/2014  Time spent: 45 minutes  Recommendations for Outpatient Follow-up:  Patient will be discharged to inpatient rehab.  She will need to continue and occupational therapy as recommended by rehabilitation. Patient will need to follow up with primary care provider within one week of discharge. Patient should also follow up with Dr. Erlinda Hong, neurologist within 2 months of discharge. Patient should also follow-up with Dr. Wynonia Lawman, cardiologist.  Patient should continue medications as prescribed.  Patient should follow a heart healthy diet. Patient advised to stop smoking and using illegal substances. Blood cultures will need to be followed.   Discharge Diagnoses:  Acute CVA Aortic valve irregularity/murmur Uncontrolled/ accelerated hypertension New systolic/diastolic CHF Polysubstance abuse/tobacco abuse  Discharge Condition: stable   Diet recommendation: heart healthy  Filed Weights   10/06/14 1032 10/07/14 0040  Weight: 91.627 kg (202 lb) 83.6 kg (184 lb 4.9 oz)    History of present illness:  on 10/06/2014 by Dr. Jennette Kettle Brandy Roberts is a 44 y.o. female who presents to the ED with c/o leg pain and stumbling since she got up this morning. Patient is unable to walk straight since that time. LKW was last night. No visual changes, leg feels weak as well. No history of similar symptoms.  Hospital Course:  Acute CVA, likely embolic -CT head: no acute intracranial abnormality  -MRI brain: Small acute lacunar infarcts, lateral right thalamus/posterior limb right external capsule, right corona radiata; chronic lacunar infarcts -Echocardiogram 66-06%, Grade 1 diastolic dysfunction -Lower extremity doppler pending -LDL 113, continue statin -hemoglobin A1c 5.8 -PT consulted and recommended CIR -Neurology consulted and  appreciated, given patient's echocardiogram findings, neurology feels TEE is now appropriate.  -Cardiology consulted and appreciated -TEE EF 40-45%, global hypokinesis, trileaflet aortic valve with tiny regularity cannot exclude small vegetation, moderate to severe aortic insufficiency -Continue aspirin and plavix for 3 months, then plavix alone  Aortic valve irregularity/murmur -TEE could not exclude small vegetation -Blood Cultures currently pending -Patient will be discharged to inpatient rehabilitation, these results may be followed up by inpatient rehabilitation. If patient's blood cultures are positive she will need 6 weeks of IV antibiotics.  Uncontrolled/Accelerated Hypertension -Patient admits to not taking her medications for hypertension because they made her feel sick- she was taking coreg and HCTZ -Continue lisinopril and metoprolol, hydralazine, spironolactone -Cardiology consulted and appreicated -Renal artery [place] ordered for stenosis pending  New Systolic/Diastolic CHF -Currently compensated -Likely secondary to uncontrolled HTN -Cardiology consulted and appreicated -Continue lisinopril, metoprolol, spironolactone added  Polysubstance abuse/Tobacco abuse -UDS: positive for opiates and THC  -Patient counseled on cessation  Procedures  Echocardiogram TEE Lower ext doppler  Consults  Neurology Cardiology Inpatient rehab  Discharge Exam: Filed Vitals:   10/10/14 1046  BP: 194/75  Pulse: 62  Temp: 98.7 F (37.1 C)  Resp: 20   Exam  General: Well developed, well nourished, No distress  HEENT: NCAT,mucous membranes moist.   Cardiovascular: S1 S2 auscultated, 2/6 SEM. RRR  Respiratory: Clear to auscultation  Abdomen: Soft, nontender, nondistended, + bowel sounds  Extremities: warm dry without cyanosis clubbing or edema  Neuro: AAOx3, Left sided weakness  Psych: Appropriate mood and affect  Discharge Instructions      Discharge  Instructions    Discharge instructions    Complete by:  As directed   Patient will be discharged to inpatient rehab.  She will need to continue and  occupational therapy as recommended by rehabilitation. Patient will need to follow up with primary care provider within one week of discharge. Patient should also follow up with Dr. Rigoberto Noel, neurologist within 2 months of discharge. Patient should also follow-up with Dr. Wynonia Lawman, cardiologist.  Patient should continue medications as prescribed.  Patient should follow a heart healthy diet. Patient advised to stop smoking and using illegal substances.            Medication List    TAKE these medications        aspirin EC 325 MG tablet  Take 1 tablet (325 mg total) by mouth daily.     atorvastatin 10 MG tablet  Commonly known as:  LIPITOR  Take 1 tablet (10 mg total) by mouth daily at 6 PM.     bisacodyl 5 MG EC tablet  Commonly known as:  DULCOLAX  Take 2 tablets (10 mg total) by mouth daily as needed for moderate constipation.     clopidogrel 75 MG tablet  Commonly known as:  PLAVIX  Take 1 tablet (75 mg total) by mouth daily.     docusate sodium 100 MG capsule  Commonly known as:  COLACE  Take 1 capsule (100 mg total) by mouth 2 (two) times daily.     hydrALAZINE 25 MG tablet  Commonly known as:  APRESOLINE  Take 1 tablet (25 mg total) by mouth 3 (three) times daily with meals.     lisinopril 20 MG tablet  Commonly known as:  PRINIVIL,ZESTRIL  Take 1 tablet (20 mg total) by mouth 2 (two) times daily.     metoprolol 50 MG tablet  Commonly known as:  LOPRESSOR  Take 1 tablet (50 mg total) by mouth 2 (two) times daily.     oxyCODONE-acetaminophen 5-325 MG per tablet  Commonly known as:  PERCOCET  Take 2 tablets by mouth at bedtime as needed for severe pain.     spironolactone 25 MG tablet  Commonly known as:  ALDACTONE  Take 1 tablet (25 mg total) by mouth daily.       No Known Allergies Follow-up Information    Follow up  with Xu,Jindong, MD. Schedule an appointment as soon as possible for a visit in 2 months.   Specialty:  Neurology   Why:  Stroke clinic   Contact information:   [address] [place] [address] [phone]       Follow up with Kerry Hough, MD. Schedule an appointment as soon as possible for a visit in 1 month.   Specialty:  Cardiology   Why:  Hospital follow up    Contact information:   [facility] [address] [phone]        The results of significant diagnostics from this hospitalization (including imaging, microbiology, ancillary and laboratory) are listed below for reference.    Significant Diagnostic Studies: Ct Angio Head W/cm &/or Wo Cm  10/07/2014   CLINICAL DATA:  Stroke with left leg pain.  EXAM: CT ANGIOGRAPHY HEAD AND NECK  TECHNIQUE: Multidetector CT imaging of the head and neck was performed using the standard protocol during bolus administration of intravenous contrast. Multiplanar CT image reconstructions and MIPs were obtained to evaluate the vascular anatomy. Carotid stenosis measurements (when applicable) are obtained utilizing NASCET criteria, using the distal internal carotid diameter as the denominator.  CONTRAST:  10mL OMNIPAQUE IOHEXOL 350 MG/ML SOLN  COMPARISON:  10/06/2014  FINDINGS: CT HEAD  Skull and Sinuses:Negative for  fracture or destructive process. The mastoids, middle ears, and imaged paranasal sinuses are clear.  Orbits: No acute abnormality.  Brain: No evidence of acute infarction, hemorrhage, hydrocephalus, or mass lesion/mass effect. A remote lacunar infarct is again noted in the left centrum semiovale. Known acute infarcts in the right hemisphere are not visualized. No hemorrhagic transformation. No hydrocephalus or shift.  CTA NECK  Aortic arch: Limited imaging of the arch. No evidence of dissection or notable plaque. There is a three-vessel origin without origin stenosis.  Right carotid  system: There is no evidence of atherosclerosis, cervical carotid dissection, or stenosis. Tortuous ICA at the skullbase.  Left carotid system: There is no evidence of atherosclerosis, cervical carotid dissection, or stenosis. Tortuous ICA at the skullbase.  Vertebral arteries:Patent bilateral proximal subclavian arteries. No focal stenosis of the vertebral arteries. There is mild luminal beading of the bilateral V3 segments, without evidence of superimposed dissection.  Skeleton: No contributory findings.  CTA HEAD  Anterior circulation: Anterior communicating artery is present. Balanced carotid arteries. Posterior communicating arteries are not clearly visualized. The bilateral petrous and cavernous carotids are diffusely narrowed, without abrupt caliber change or cervical carotid involvement suggestive of dissection. This is likely atherosclerotic as there is patchy mural calcification. Stenosis is up to moderate at the petrouscavernous junction on the left.  2 mm inferior outpouching from the right supraclinoid carotid artery without visible associated vessel.  Posterior circulation: Markedly the tortuous V4 segments. No major vessel occlusion, notable stenosis, or aneurysm.  Venous sinuses: Patent  Anatomic variants: None of significance  Delayed phase: No parenchymal enhancement  IMPRESSION: 1. No acute arterial findings. 2. No evidence of infarct progression since yesterday. No hemorrhagic conversion. 3. Diffuse bilateral petrous and cavernous carotid stenosis, likely atherosclerotic. Narrowing is up to moderate at the left petrous cavernous junction. 4. Mild beading of the bilateral V3 segments, question fibromuscular dysplasia. 5. 2 mm outpouching from the right supraclinoid carotid, aneurysm or infundibulum with parent vessel below the resolution of CTA. 6. No arterial stenosis in the neck.   Electronically Signed   By: Monte Fantasia M.D.   On: 10/07/2014 13:01   Ct Head Wo Contrast  10/06/2014    CLINICAL DATA:  Leg pain, left-sided weakness  EXAM: CT HEAD WITHOUT CONTRAST  TECHNIQUE: Contiguous axial images were obtained from the base of the skull through the vertex without intravenous contrast.  COMPARISON:  12/26/2008  FINDINGS: No skull fracture is noted. Paranasal sinuses and mastoid air cells are unremarkable. No intracranial hemorrhage, mass effect or midline shift. No acute cortical infarction. No mass lesion is noted on this unenhanced scan. Ventricular size is stable from prior exam. No intra or extra-axial fluid collection. The gray and white-matter differentiation is preserved.  IMPRESSION: No acute intracranial abnormality. No definite acute cortical infarction.   Electronically Signed   By: Lahoma Crocker M.D.   On: 10/06/2014 13:18   Ct Angio Neck W/cm &/or Wo/cm  10/07/2014   CLINICAL DATA:  Stroke with left leg pain.  EXAM: CT ANGIOGRAPHY HEAD AND NECK  TECHNIQUE: Multidetector CT imaging of the head and neck was performed using the standard protocol during bolus administration of intravenous contrast. Multiplanar CT image reconstructions and MIPs were obtained to evaluate the vascular anatomy. Carotid stenosis measurements (when applicable) are obtained utilizing NASCET criteria, using the distal internal carotid diameter as the denominator.  CONTRAST:  87mL OMNIPAQUE IOHEXOL 350 MG/ML SOLN  COMPARISON:  10/06/2014  FINDINGS: CT HEAD  Skull and Sinuses:Negative for fracture or  destructive process. The mastoids, middle ears, and imaged paranasal sinuses are clear.  Orbits: No acute abnormality.  Brain: No evidence of acute infarction, hemorrhage, hydrocephalus, or mass lesion/mass effect. A remote lacunar infarct is again noted in the left centrum semiovale. Known acute infarcts in the right hemisphere are not visualized. No hemorrhagic transformation. No hydrocephalus or shift.  CTA NECK  Aortic arch: Limited imaging of the arch. No evidence of dissection or notable plaque. There is a  three-vessel origin without origin stenosis.  Right carotid system: There is no evidence of atherosclerosis, cervical carotid dissection, or stenosis. Tortuous ICA at the skullbase.  Left carotid system: There is no evidence of atherosclerosis, cervical carotid dissection, or stenosis. Tortuous ICA at the skullbase.  Vertebral arteries:Patent bilateral proximal subclavian arteries. No focal stenosis of the vertebral arteries. There is mild luminal beading of the bilateral V3 segments, without evidence of superimposed dissection.  Skeleton: No contributory findings.  CTA HEAD  Anterior circulation: Anterior communicating artery is present. Balanced carotid arteries. Posterior communicating arteries are not clearly visualized. The bilateral petrous and cavernous carotids are diffusely narrowed, without abrupt caliber change or cervical carotid involvement suggestive of dissection. This is likely atherosclerotic as there is patchy mural calcification. Stenosis is up to moderate at the petrouscavernous junction on the left.  2 mm inferior outpouching from the right supraclinoid carotid artery without visible associated vessel.  Posterior circulation: Markedly the tortuous V4 segments. No major vessel occlusion, notable stenosis, or aneurysm.  Venous sinuses: Patent  Anatomic variants: None of significance  Delayed phase: No parenchymal enhancement  IMPRESSION: 1. No acute arterial findings. 2. No evidence of infarct progression since yesterday. No hemorrhagic conversion. 3. Diffuse bilateral petrous and cavernous carotid stenosis, likely atherosclerotic. Narrowing is up to moderate at the left petrous cavernous junction. 4. Mild beading of the bilateral V3 segments, question fibromuscular dysplasia. 5. 2 mm outpouching from the right supraclinoid carotid, aneurysm or infundibulum with parent vessel below the resolution of CTA. 6. No arterial stenosis in the neck.   Electronically Signed   By: Monte Fantasia M.D.   On:  10/07/2014 13:01   Mr Brain Wo Contrast  10/06/2014   CLINICAL DATA:  44 year old female with left lower extremity pain and weakness. Initial encounter.  EXAM: MRI HEAD WITHOUT CONTRAST  TECHNIQUE: Multiplanar, multiecho pulse sequences of the brain and surrounding structures were obtained without intravenous contrast.  COMPARISON:  Head CT without contrast 1315 hours today.  FINDINGS: There is a 12 mm area of restricted diffusion in the lateral right thalamus near the posterior limb of the right internal capsule. Associated T2 and FLAIR hyperintensity, but no hemorrhage or mass effect.  Also in the right hemisphere at the corona radiata just above the low right lateral ventricle there is a small 6-8 mm focus of restricted diffusion. See series 5, image 18. This level also demonstrates mild T2 and FLAIR hyperintensity with no hemorrhage or mass effect.  In the left corona radiata there is signal abnormality most resembling a chronic lacunar infarct (series 9, image 18). Several other scattered bilateral cerebral white matter T2 and FLAIR hyperintense foci are nonspecific. In the left globus pallidus and anterior limb of the external capsule there are signal changes suggesting chronic lacunar infarcts. There is hemosiderin associated with the former.  The brainstem and cerebellum are within normal limits. Major intracranial vascular flow voids are within normal limits.  No midline shift, mass effect, evidence of mass lesion, ventriculomegaly, extra-axial collection or acute intracranial hemorrhage.  Cervicomedullary junction and pituitary are within normal limits. Negative visualized cervical spine. Visible internal auditory structures appear normal. Trace left mastoid fluid. Negative nasopharynx. Trace paranasal sinus mucosal thickening. Visualized orbit soft tissues are within normal limits. Visualized scalp soft tissues are within normal limits. Visualized bone marrow signal is within normal limits.  IMPRESSION:  1. Small acute lacunar infarcts in the lateral right thalamus/posterior limb right external capsule (right PCA territory) and also the right corona radiata (right MCA territory). No mass effect or hemorrhage. 2. Superimposed chronic lacunar infarcts in the left corona radiata and left basal ganglia (left MCA territory). 3. In light of the above in the patient's age, consider a source of cardiac or paradoxical emboli, such as PFO. And otherwise consider risk factors for accelerated small vessel disease.   Electronically Signed   By: Genevie Ann M.D.   On: 10/06/2014 20:25    Microbiology: No results found for this or any previous visit (from the past 240 hour(s)).   Labs: Basic Metabolic Panel:  Recent Labs Lab 10/06/14 1153 10/06/14 1207  NA 140 142  K 3.6 3.8  CL 105 102  CO2 26  --   GLUCOSE 106* 107*  BUN 17 24*  CREATININE 1.02* 0.90  CALCIUM 9.3  --    Liver Function Tests:  Recent Labs Lab 10/06/14 1153  AST 27  ALT 19  ALKPHOS 68  BILITOT 0.6  PROT 7.6  ALBUMIN 3.8   No results for input(s): LIPASE, AMYLASE in the last 168 hours. No results for input(s): AMMONIA in the last 168 hours. CBC:  Recent Labs Lab 10/06/14 1153 10/06/14 1207  WBC 5.6  --   NEUTROABS 2.3  --   HGB 14.3 16.3*  HCT 43.5 48.0*  MCV 88.6  --   PLT 180  --    Cardiac Enzymes: No results for input(s): CKTOTAL, CKMB, CKMBINDEX, TROPONINI in the last 168 hours. BNP: BNP (last 3 results) No results for input(s): BNP in the last 8760 hours.  ProBNP (last 3 results) No results for input(s): PROBNP in the last 8760 hours.  CBG: No results for input(s): GLUCAP in the last 168 hours.     SignedCristal Ford  [facility] 10/10/2014, 11:19 AM

## 2014-10-10 NOTE — Progress Notes (Signed)
Rehab admissions - I met with patient.  I explained her benefits.  She understands that [place] is not in tier 1 with her insurance carrier.  Patient wishes to admit to [facility] inpatient rehab.  Bed available and will admit to acute inpatient rehab today.  I do have authorization from [employer] insurance.  Call me for questions.  #[phone] 

## 2014-10-10 NOTE — PMR Pre-admission (Signed)
PMR Admission Coordinator Pre-Admission Assessment  Patient: Brandy Roberts is an 44 y.o., female MRN: [id number] DOB: [birth year] Height: 5\' 6"  (167.6 cm) Weight: 83.6 kg (184 lb 4.9 oz)              Insurance Information HMO:      PPO:       PCP:       IPA:       80/20:       OTHER: POS PRIMARYHolley Bouche      Policy#: [id number]      Subscriber: [name] CM Name: Sunday Shams      Phone#: [phone]     Fax#: [phone] Pre-Cert#: [id number] for 7 days      Employer:  FT Benefits:  Phone #: [phone]     Name: [employer]. Date: 08/01/14     Deduct:  $6750      Out of Pocket Max: [phone]      Life Max: unlimited CIR: $500/admit after deductible   SNF: $500 copay after deductible with 90 days max Outpatient: $500 after deductible     Co-Pay:   Home Health: $500 after deductible      Co-Pay:  120 days max DME: 100% after deductible     Co-Pay:   Providers:  In network  Medicaid Application Date:        Case Manager:   Disability Application Date:        Case Worker:    Emergency Contact Information Contact Information    Name Relation Home Work Mobile   [place] Son   [phone]   Walls,Starlett Sister   [phone]   Dorothy Spark   [phone]     Current Medical History  Patient Admitting Diagnosis: Right thalamic, PLIC infarct    History of Present Illness: A 44 y.o. right handed female with history of hypertension and tobacco abuse. 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 Admitted 10/06/2014 with decreased balance and left-sided weakness. Blood pressure 196/88. MRI of the brain showed acute lacunar infarct lateral right thalamus posterior limb right external capsule and also the right corona radiata. Superimposed chronic lacunar infarcts in the left corona radiata and left basal ganglia. Echocardiogram with ejection fraction of 62% grade 1 diastolic  dysfunction. CT angiogram head and neck with no acute arterial findings. Patient did not receive TPA. Neurology consulted maintain on aspirin and Plavix for CVA prophylaxis. Subcutaneous heparin for DVT prophylaxis. TEE is pending. Physical and occupational therapy evaluations completed with recommendations of physical medicine rehabilitation consult.    Total: 5=NIH  Past Medical History  Past Medical History  Diagnosis Date  . Hypertension   . Stroke     Family History  family history is not on file.  Prior Rehab/Hospitalizations:  No previous rehab admissions   Current Medications   Current facility-administered medications:  .  aspirin EC tablet 81 mg, 81 mg, Oral, Daily, Rosalin Hawking, MD, 81 mg at 10/10/14 0928 .  atorvastatin (LIPITOR) tablet 10 mg, 10 mg, Oral, q1800, Maryann Mikhail, DO, 10 mg at 10/09/14 1736 .  bisacodyl (DULCOLAX) EC tablet 10 mg, 10 mg, Oral, Daily PRN, Rhetta Mura Schorr, NP, 10 mg at 10/09/14 2114 .  clopidogrel (PLAVIX) tablet 75 mg, 75 mg, Oral, Daily, Rosalin Hawking, MD, 75 mg at 10/10/14 0929 .  docusate sodium (COLACE) capsule 100 mg, 100 mg, Oral, BID, Rhetta Mura Schorr, NP, 100 mg at 10/09/14 2115 .  heparin injection 5,000 Units, 5,000 Units, Subcutaneous, 3 times per day, Etta Quill, DO, 5,000 Units at 10/10/14 [phone] .  hydrALAZINE (APRESOLINE) injection 10-20 mg, 10-20 mg, Intravenous, Q4H PRN, Etta Quill, DO, 20 mg at 10/08/14 0730 .  hydrALAZINE (APRESOLINE) tablet 25 mg, 25 mg, Oral, TID WC, Jacolyn Reedy, MD .  lisinopril (PRINIVIL,ZESTRIL) tablet 20 mg, 20 mg, Oral, BID, Rosalin Hawking, MD, 20 mg at 10/10/14 0928 .  metoprolol (LOPRESSOR) tablet 50 mg, 50 mg, Oral, BID, Rosalin Hawking, MD, 50 mg at 10/10/14 0928 .  spironolactone (ALDACTONE) tablet 25 mg, 25 mg, Oral, Daily, Jacolyn Reedy, MD, 25 mg at 10/10/14 6734  Patients Current Diet: Diet Heart Room service appropriate?: Yes; Fluid consistency:: Thin  Precautions /  Restrictions Precautions Precautions: Fall Precaution Comments: difficulty with full WB left leg in standing Restrictions Weight Bearing Restrictions: No   Prior Activity Level Community (5-7x/wk): Pt was independent prior to admit, works full time in Product/process development scientist at [employer]. She enjoys relaxing at home watching TV when not at work and she is expecting her first baby girl grandchild any day!   Home Assistive Devices / Equipment Home Assistive Devices/Equipment: None Home Equipment: None  Prior Functional Level Prior Function Level of Independence: Independent  Current Functional Level Cognition  Overall Cognitive Status: Within Functional Limits for tasks assessed Orientation Level: Oriented X4    Extremity Assessment (includes Sensation/Coordination)  Upper Extremity Assessment: LUE deficits/detail LUE Deficits / Details: 3-/5 shoulder flexion LUE Sensation: decreased light touch LUE Coordination: decreased gross motor, decreased fine motor  Lower Extremity Assessment: Defer to PT evaluation LLE Deficits / Details: <3/5 hip flexor, 3+/5 knee extensor, 4-/5 ankle DF LLE Sensation: decreased proprioception LLE Coordination: decreased gross motor    ADLs  Overall ADL's : Needs assistance/impaired Grooming: Set up, Supervision/safety, Sitting Upper Body Dressing : Minimal assistance, Sitting Lower Body Dressing: Moderate assistance, Sit to/from stand Toilet Transfer: Moderate assistance, Ambulation, RW (chair; Min assist for sit to stand transfer.) Functional mobility during ADLs: Moderate assistance, Rolling walker General ADL Comments: Educated on dressing technique. Encouraged pt to be using left hand for activities and gave her some suggestions.     Mobility  Overal bed mobility: Needs Assistance Bed Mobility: Supine to Sit (to Rt) Supine to sit: Min assist, HOB elevated General bed mobility comments: not assessed    Transfers  Overall transfer level:  Needs assistance Equipment used: Rolling walker (2 wheeled) Transfers: Sit to/from Stand Sit to Stand: Min assist Stand pivot transfers: Mod assist (verbal cues for seqeuncing) General transfer comment: assist to boost. cues for technique.    Ambulation / Gait / Stairs / Wheelchair Mobility  Ambulation/Gait Ambulation/Gait assistance: Mod assist Ambulation Distance (Feet): 35 Feet (and 10') Assistive device: Rolling walker (2 wheeled) General Gait Details: facilitation and cues left hip control in stance, cues for left heel strike, foot clearance with facilitation for increased right weight shift in swing Gait Pattern/deviations: Step-through pattern, Decreased dorsiflexion - left, Shuffle, Decreased stance time - left, Narrow base of support, Staggering left    Posture / Balance Balance Overall balance assessment: Needs assistance Sitting-balance support: Bilateral upper extremity supported, Feet supported Sitting balance-Leahy Scale: Fair Postural control: Left lateral lean Standing balance support: Bilateral upper extremity supported Standing balance-Leahy Scale: Poor Standing balance comment: assist to maintain balance with walker and at sink washing hands due to left lateral LOB    Special needs/care consideration BiPAP/CPAP No CPM No Continuous Drip IV No Dialysis No  Life Vest No Oxygen No Special Bed No Trach Size No Wound Vac (area) No      Skin No                            Bowel mgmt: Last BM 10/10/14 Bladder mgmt: Voiding WDL Diabetic mgmt No    Previous Home Environment Living Arrangements: Children (pt lives with her adult son who works evenings) Available Help at Discharge: Family, Available 24 hours/day (sister to assist upon discharge) Type of Home: Apartment Home Layout: One level Home Access: Stairs to enter Entrance Stairs-Rails: Left Entrance Stairs-Number of Steps: 3 Bathroom Shower/Tub: Chiropodist: [place]: No 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  Discharge Living Setting Plans for Discharge Living Setting: Patient's home, Apartment, Lives with (comment) (lives with her adult son) Type of Home at Discharge: Apartment Discharge Home Layout: One level Discharge Home Access: Stairs to enter Entrance Stairs-Rails: Left Entrance Stairs-Number of Steps: 3 Discharge Bathroom Shower/Tub: Tub/shower unit Discharge Bathroom Toilet: Standard Does the patient have any problems obtaining your medications?: No  Social/Family/Support Systems Patient Roles: Other (Comment) (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 One of pt's sister is local but works as a Glass blower/designer and the other 2 sisters live away. The other 2 sisters may be able to stay with pt. 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 Will admit to inpatient rehab today.  Preadmission Screen Completed By:  Retta Diones, 10/10/2014 11:57 AM ______________________________________________________________________   Discussed status with Dr. Naaman Plummer on 10/10/14 at 1156 and received telephone approval for admission today.  Admission Coordinator:  Retta Diones, time1156/Date05/10/16

## 2014-10-10 NOTE — H&P (Signed)
Expand All Collapse All      Physical Medicine and Rehabilitation Admission H&P   Chief Complaint  Patient presents with  . Leg Pain  : HPI: Brandy Roberts is a 44 y.o. right handed female with history of hypertension and tobacco abuse. 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 Admitted 10/06/2014 with decreased balance and left-sided weakness. Blood pressure 196/88.UDS positive THC. MRI of the brain showed acute lacunar infarct lateral right thalamus posterior limb right external capsule and also the right corona radiata. Superimposed chronic lacunar infarcts in the left corona radiata and left basal ganglia. Echocardiogram with ejection fraction of 81% grade 1 diastolic dysfunction. CT angiogram head and neck with no acute arterial findings. Patient did not receive TPA.Venous doppler negative. Neurology consulted maintain on aspirin and Plavix for CVA prophylaxis. Subcutaneous heparin for DVT prophylaxis. TEE completed showing moderate to severe eccentric aortic regurgitation no evidence of endocarditis.[name] Physical and occupational therapy evaluations completed with recommendations of physical medicine rehabilitation consult. Patient was admitted for a comprehensive rehabilitation program  ROS Review of Systems  Neurological: Positive for dizziness and weakness.  All other systems reviewed and are negative   Past Medical History  Diagnosis Date  . Hypertension    Past Surgical History  Procedure Laterality Date  . Cesarean section    . Tonsillectomy    . Ganglion cyst excision     History reviewed. No pertinent family history. Social History:  reports that she has been smoking. She has never used smokeless tobacco. She reports that she does not drink alcohol or use illicit drugs. Allergies: No Known Allergies Medications Prior to Admission  Medication Sig Dispense Refill   . oxyCODONE-acetaminophen (PERCOCET) 5-325 MG per tablet Take 2 tablets by mouth at bedtime as needed for severe pain. 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 cues for technique. Ambulation/Gait Ambulation/Gait assistance: Mod assist Ambulation Distance (Feet): 35 Feet (and 10') Assistive device: Rolling walker (2 wheeled) General Gait Details: facilitation and cues left hip control in stance, cues for left heel strike, foot clearance with facilitation for increased right weight shift in swing Gait Pattern/deviations: Step-through pattern, Decreased dorsiflexion - left, Shuffle, Decreased stance time - left, Narrow base of support, Staggering left    ADL: ADL Overall ADL's : Needs assistance/impaired Grooming: Set up, Supervision/safety, Sitting Upper Body Dressing : Minimal assistance, Sitting Lower Body Dressing: Moderate assistance, Sit to/from stand Toilet Transfer: Moderate assistance,  Ambulation, RW (chair; Min assist for sit to stand transfer.) Functional mobility during ADLs: Moderate assistance, Rolling walker General ADL Comments: Educated on dressing technique. Encouraged pt to be using  left hand for activities and gave her some suggestions.   Cognition: Cognition Overall Cognitive Status: Within Functional Limits for tasks assessed Orientation Level: Oriented X4 Cognition Arousal/Alertness: Awake/alert Behavior During Therapy: WFL for tasks assessed/performed Overall Cognitive Status: Within Functional Limits for tasks assessed  Physical Exam: Blood pressure 186/69, pulse 56, temperature 98.3 F (36.8 C), temperature source Oral, resp. rate 18, height 5\' 6"  (1.676 m), weight 83.6 kg (184 lb 4.9 oz), last menstrual period 10/03/2014, SpO2 92 %. Physical Exam Constitutional: She appears well-developed.  HENT:  Head: Normocephalic.  Eyes: EOM are normal.  Neck: Normal range of motion. Neck supple. No thyromegaly present.  Cardiovascular: Normal rate and regular rhythm.  Respiratory: Effort normal and breath sounds normal. No respiratory distress.  GI: Soft. Bowel sounds are normal. She exhibits no distension.  Neurological: She is alert.  Patient is a bit anxious. Oriented to person place and date of birth. Follows all commands. Fair awareness of deficits. Left facial weakness. LUE 4-/5 prox to distal. LLE: 1+ HF to 3-/5 at knee and ankles. Sensation 1-/2 Left side--minimal sense of painful stim. Delayed processing and initiation in all 4 limbs with apraxia. Improved from last exam. Fair insight and awareness.  Skin: Skin is warm and dry.  Psychiatric:  Flat, generally cooperative    Lab Results Last 48 Hours    No results found for this or any previous visit (from the past 48 hour(s)).    Imaging Results (Last 48 hours)    No results found.       Medical Problem List and Plan: 1. Functional deficits secondary to right thalamic/PLIC  infarct 2. DVT Prophylaxis/Anticoagulation: Subcutaneous heparin. Monitor platelet counts and any signs of bleeding.Vascular study negative 3. Pain Management: Tylenol as needed 4. Hypertension. Lisinopril 20 mg twice a day, Lopressor 50 mg twice a day, hydralazine 25 mg 3 times a day, Aldactone 25 mg daily. Monitor with increased mobility 5. Neuropsych: This patient is capable of making decisions on her own behalf. 6. Skin/Wound Care: Routine skin checks 7. Fluids/Electrolytes/Nutrition: follow I&O's with follow-up chemistries 8. Tobacco/Marijuana abuse. Counseling 9. Hyperlipidemia. Lipitor    Post Admission Physician Evaluation: 1. Functional deficits secondary to right thalamic/PLIC infarct. 2. Patient is admitted to receive collaborative, interdisciplinary care between the physiatrist, rehab nursing staff, and therapy team. 3. Patient's level of medical complexity and substantial therapy needs in context of that medical necessity cannot be provided at a lesser intensity of care such as a SNF. 4. Patient has experienced substantial functional loss from his/her baseline which was documented above under the "Functional History" and "Functional Status" headings. Judging by the patient's diagnosis, physical exam, and functional history, the patient has potential for functional progress which will result in measurable gains while on inpatient rehab. These gains will be of substantial and practical use upon discharge in facilitating mobility and self-care at the household level. 5. Physiatrist will provide 24 hour management of medical needs as well as oversight of the therapy plan/treatment and provide guidance as appropriate regarding the interaction of the two. 6. 24 hour rehab nursing will assist with bladder management, bowel management, safety, skin/wound care, disease management, medication administration and patient education and help integrate therapy concepts, techniques,education,  etc. 7. PT will assess and treat for/with: Lower extremity strength, range of motion, stamina, balance, functional mobility, safety, adaptive techniques and equipment, NMR, visual spatial awareness, cognitive perceptual awareness. Goals are: mod I to supervision. 8. OT will assess and treat for/with: ADL's, functional mobility, safety, upper  extremity strength, adaptive techniques and equipment, NMR, cognitive and visual perceptual awareness. Goals are: mod I to supervision. Therapy may proceed with showering this patient. 9. SLP will assess and treat for/with: cognition, communication. Goals are: mod I. 10. Case Management and Social Worker will assess and treat for psychological issues and discharge planning. 11. Team conference will be held weekly to assess progress toward goals and to determine barriers to discharge. 12. Patient will receive at least 3 hours of therapy per day at least 5 days per week. 13. ELOS: 11-17 days  14. Prognosis: excellent     Meredith Staggers, MD, [place] [facility] 10/10/2014  10/09/18

## 2014-10-10 NOTE — Progress Notes (Signed)
STROKE TEAM PROGRESS NOTE   SUBJECTIVE (INTERVAL HISTORY) Family members at the bedside. They are encouraging pt to stop smoking and work closely with PT/OT. Renal artery [place] not done yet.   OBJECTIVE Temp:  [97.7 F (36.5 C)-98.7 F (37.1 C)] 98.7 F (37.1 C) (05/10 1046) Pulse Rate:  [62-110] 62 (05/10 1046) Cardiac Rhythm:  [-] Normal sinus rhythm (05/10 0800) Resp:  [12-24] 20 (05/10 1046) BP: (137-244)/(59-130) 194/75 mmHg (05/10 1046) SpO2:  [95 %-100 %] 100 % (05/10 1046)  No results for input(s): GLUCAP in the last 168 hours.  Recent Labs Lab 10/06/14 1153 10/06/14 1207  NA 140 142  K 3.6 3.8  CL 105 102  CO2 26  --   GLUCOSE 106* 107*  BUN 17 24*  CREATININE 1.02* 0.90  CALCIUM 9.3  --     Recent Labs Lab 10/06/14 1153  AST 27  ALT 19  ALKPHOS 68  BILITOT 0.6  PROT 7.6  ALBUMIN 3.8    Recent Labs Lab 10/06/14 1153 10/06/14 1207  WBC 5.6  --   NEUTROABS 2.3  --   HGB 14.3 16.3*  HCT 43.5 48.0*  MCV 88.6  --   PLT 180  --    No results for input(s): CKTOTAL, CKMB, CKMBINDEX, TROPONINI in the last 168 hours. No results for input(s): LABPROT, INR in the last 72 hours. No results for input(s): COLORURINE, LABSPEC, [place], GLUCOSEU, HGBUR, BILIRUBINUR, KETONESUR, PROTEINUR, UROBILINOGEN, NITRITE, LEUKOCYTESUR in the last 72 hours.  Invalid input(s): APPERANCEUR     Component Value Date/Time   CHOL 179 10/07/2014 0613   TRIG 105 10/07/2014 0613   HDL 45 10/07/2014 0613   CHOLHDL 4.0 10/07/2014 0613   VLDL 21 10/07/2014 0613   LDLCALC 113* 10/07/2014 0613   Lab Results  Component Value Date   HGBA1C 5.8* 10/07/2014      Component Value Date/Time   LABOPIA POSITIVE* 10/06/2014 1228   COCAINSCRNUR NONE DETECTED 10/06/2014 1228   LABBENZ NONE DETECTED 10/06/2014 1228   AMPHETMU NONE DETECTED 10/06/2014 1228   THCU POSITIVE* 10/06/2014 1228   LABBARB NONE DETECTED 10/06/2014 1228     Recent Labs Lab 10/06/14 1153  ETH <5    Ct  Angio Head and neck W/cm &/or Wo Cm  10/07/2014   IMPRESSION: 1. No acute arterial findings. 2. No evidence of infarct progression since yesterday. No hemorrhagic conversion. 3. Diffuse bilateral petrous and cavernous carotid stenosis, likely atherosclerotic. Narrowing is up to moderate at the left petrous cavernous junction. 4. Mild beading of the bilateral V3 segments, question fibromuscular dysplasia. 5. 2 mm outpouching from the right supraclinoid carotid, aneurysm or infundibulum with parent vessel below the resolution of CTA. 6. No arterial stenosis in the neck.     Ct Head Wo Contrast  10/06/2014   IMPRESSION: No acute intracranial abnormality. No definite acute cortical infarction.      Mr Brain Wo Contrast  10/06/2014   IMPRESSION: 1. Small acute lacunar infarcts in the lateral right thalamus/posterior limb right external capsule (right PCA territory) and also the right corona radiata (right MCA territory). No mass effect or hemorrhage. 2. Superimposed chronic lacunar infarcts in the left corona radiata and left basal ganglia (left MCA territory). 3. In light of the above in the patient's age, consider a source of cardiac or paradoxical emboli, such as PFO. And otherwise consider risk factors for accelerated small vessel disease.      2D Echocardiogram  - Mild LV chamber dilatation with severe LVH,  LVEF 40-45%. Grade 1diastolic dysfunction with increased filling pressures. Considerhypertrophic cardiomyopathy or severe hypertensive heart disease. Mild to moderate left atrial enlargement. Moderate aortic regurgitation. Trivial tricuspid regurgitation, unable to assessPASP. Cannot exclude PFO - possible small left to right atrialshunt noted on some views. Trivial pericardial effusion.  LE venous doppler There is no DVT or SVT noted in the bilateral lower extremities.   Renal artery [place] pending   TEE  Hypertrophic left ventricle with mildly decreased LVEF 40-45% due to global hypokinesis.  Trileaflet aortic valve with a tiny irregularity, cannot exclude a small vegetation. Moderate-to-severe aortic insufficiency with a commisural jet that has a highly eccentric direction towards the mitral valve.   PHYSICAL EXAM  Temp:  [97.7 F (36.5 C)-98.7 F (37.1 C)] 98.7 F (37.1 C) (05/10 1046) Pulse Rate:  [62-110] 62 (05/10 1046) Resp:  [12-24] 20 (05/10 1046) BP: (137-244)/(59-130) 194/75 mmHg (05/10 1046) SpO2:  [95 %-100 %] 100 % (05/10 1046)  General - Well nourished, well developed, in no apparent distress.  Ophthalmologic - Sharp disc margins OU.  Cardiovascular - Regular rate and rhythm with no murmur.  Neck - supple, no carotid bruits  Mental Status -  Level of arousal and orientation to time, place, and person were intact. Language including expression, naming, repetition, comprehension was assessed and found intact.  Cranial Nerves II - XII - II - Visual field intact OU. III, IV, VI - Extraocular movements intact. V - Facial sensation decreased on the left. VII - mild left facial droop. VIII - Hearing & vestibular intact bilaterally. X - Palate elevates symmetrically. XI - Chin turning & shoulder shrug intact bilaterally. XII - Tongue protrusion intact.  Motor Strength - The patient's strength was 4-/5 LUE and 3-/5 LLE, good strength on the right.  Bulk was normal and fasciculations were absent.   Motor Tone - Muscle tone was assessed at the neck and appendages and was normal.  Reflexes - The patient's reflexes were symmetrical in all extremities and she had no pathological reflexes.  Sensory - Light touch, temperature/pinprick were assessed and were decreased on the left upper and lower extremities    Coordination - The patient had ataxia on the left FTN but not out of proportion to the weakness.  Tremor was absent.  Gait and Station - not able to test due to weakness.   ASSESSMENT/PLAN Brandy Roberts is a 44 y.o. female with history of HTN  not on meds and current smoker admitted for left-sided hemiparesis. Symptoms and changed.    Stroke:  Non-dominant right thalamus and corona radiata separated infarcts, felt to be consistent with small vessel disease due to significant stroke risk factors. However, embolic stroke cannot be completely ruled out.  MRI  right thalamus and right corona radiata separated 2 infarcts  CTA head and neck showed left cavernous ICA high-grade stenosis  LE venous Doppler negative  2D Echo  EF 40-45%  TEE EF 40-45% due to global hypokinesis. Trileaflet aortic valve with a tiny irregularity, cannot r/o endocarditis.   LDL 113, not at goal  HgbA1c 5.8, WNL  Heparin subcutaneous for VTE prophylaxis  Diet Heart Room service appropriate?: Yes; Fluid consistency:: Thin   Aspirin 325 prior to admission, now on aspirin 81 mg orally every day and clopidogrel 75 mg orally every day for intracranial stenosis. Recommend to continue dural antiplatelet for 3 months and then Plavix alone.  Patient counseled to be compliant with her antithrombotic medications  Ongoing aggressive stroke risk factor management  Dispo: [facility]. Insurance authorization underway. Anticipate discharge there today.  Follow up with Dr. Erlinda Hong in 2 months. Order written.  ? Aortic valve vegetation  Tiny one seen in TEE  Can not rule out endocarditis  Cardiology on board and recommend blood cultures and follow up TEE in 2-3 weeks.  Hypertension, malignant  Home meds:  None  Patient blood pressure still high  Currently on lisinopril 20 bid and increase metoprolol to 50mg  bid  Unstable  Renal artery ultrasound pending (Dr. Erlinda Hong reordered upon direction of vascular lab)  Secondary malignant hypertension labs remain pending  Hyperlipidemia  Home meds:  None   Currently on Lipitor 10  LDL 113, goal < 70  Continue statin at discharge  Tobacco abuse  Current smoker  Smoking cessation counseling provided  Pt is willing to  quit  THC abuse  THC cessation counseling provided  Pt is willing to quit  Hospital day # [place] for Pager information 10/10/2014 12:10 PM   I, the attending vascular neurologist, have personally obtained a history, examined the patient, evaluated laboratory data, individually viewed imaging studies and agree with radiology interpretations.  Together with the NP/PA, we formulated the assessment and plan of care which reflects our mutual decision.  I have made any additions or clarifications directly to the above note and agree with the findings and plan as currently documented.   44 yo F with hx of HTN not meds and smoker and THC user admitted for right thalamus and corona radiata stroke. BP very high and on po meds, still fluctuating, need closely adjust meds. Stroke felt to be small vessel disease, due to multiple risk factor. EF 40-45%, cardiology on board, TEE showed tiny aortic valve vegetation, recommend blood culture and repeat TEE in 3 weeks. Currently negative DVT, waiting for renal A doppler and CIR placement.  Rosalin Hawking, MD PhD Stroke Neurology 10/10/2014 4:54 PM     To contact Stroke Continuity provider, please refer to [email or web address] After hours, contact General Neurology

## 2014-10-10 NOTE — Progress Notes (Signed)
Retta Diones, RN Rehab Admission Coordinator Signed Physical Medicine and Rehabilitation PMR Pre-admission 10/10/2014 11:24 AM  Related encounter: ED to Hosp-Admission (Discharged) from 10/06/2014 in [facility] Collapse All   PMR Admission Coordinator Pre-Admission Assessment  Patient: Brandy Roberts is an 44 y.o., female MRN: [id number] DOB: [birth year] Height: 5\' 6"  (167.6 cm) Weight: 83.6 kg (184 lb 4.9 oz)  Insurance Information HMO: PPO: PCP: IPA: 80/20: OTHER: POS PRIMARYHolley Bouche Policy#: [id number] Subscriber: [name] CM Name: Sunday Shams Phone#: [phone] Fax#: [phone] Pre-Cert#: [id number] for 7 days Employer: [employer] Benefits: Phone #: [phone] Name: [employer]. Date: 08/01/14 Deduct: $6750 Out of Pocket Max: [id number] Life Max: unlimited CIR: $500/admit after deductible SNF: $500 copay after deductible with 90 days max Outpatient: $500 after deductible Co-Pay:  Home Health: $500 after deductible Co-Pay: 120 days max DME: 100% after deductible Co-Pay:  Providers: In network  Medicaid Application Date: Case Manager:  Disability Application Date: Case Worker:   Emergency Contact Information Contact Information    Name Relation Home Work Mobile   Lyman Son   [phone]   Walls,Starlett Sister   [phone]   Dorothy Spark   [phone]     Current Medical History  Patient Admitting Diagnosis: Right thalamic, PLIC infarct   History of Present Illness: A 44 y.o. right handed female with history of hypertension and tobacco abuse. 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 Admitted 10/06/2014 with decreased balance and left-sided weakness. Blood pressure 196/88. MRI of the brain showed acute lacunar infarct lateral right thalamus posterior limb right external capsule and also the right corona radiata. Superimposed chronic lacunar infarcts in the left corona radiata and left basal ganglia. Echocardiogram with ejection fraction of 43% grade 1 diastolic dysfunction. CT angiogram head and neck with no acute arterial findings. Patient did not receive TPA. Neurology consulted maintain on aspirin and Plavix for CVA prophylaxis. Subcutaneous heparin for DVT prophylaxis. TEE is pending. Physical and occupational therapy evaluations completed with recommendations of physical medicine rehabilitation consult.   Total: 5=NIH  Past Medical History  Past Medical History  Diagnosis Date  . Hypertension   . Stroke     Family History  family history is not on file.  Prior Rehab/Hospitalizations: No previous rehab admissions  Current Medications   Current facility-administered medications:  . aspirin EC tablet 81 mg, 81 mg, Oral, Daily, Rosalin Hawking, MD, 81 mg at 10/10/14 0928 . atorvastatin (LIPITOR) tablet 10 mg, 10 mg, Oral, q1800, Maryann Mikhail, DO, 10 mg at 10/09/14 1736 . bisacodyl (DULCOLAX) EC tablet 10 mg, 10 mg, Oral, Daily PRN, Rhetta Mura Schorr, NP, 10 mg at 10/09/14 2114 . clopidogrel (PLAVIX) tablet 75 mg, 75 mg, Oral, Daily, Rosalin Hawking, MD, 75 mg at 10/10/14 0929 . docusate sodium (COLACE) capsule 100 mg, 100 mg, Oral, BID, Rhetta Mura Schorr, NP, 100 mg at 10/09/14 2115 . heparin injection 5,000 Units, 5,000 Units, Subcutaneous, 3 times per day, Etta Quill, DO, 5,000 Units at 10/10/14 [phone] . hydrALAZINE (APRESOLINE) injection 10-20 mg, 10-20 mg, Intravenous, Q4H PRN, Etta Quill, DO, 20 mg at 10/08/14 0730 . hydrALAZINE (APRESOLINE) tablet 25 mg, 25 mg, Oral, TID WC, Jacolyn Reedy, MD . lisinopril (PRINIVIL,ZESTRIL) tablet 20 mg, 20 mg, Oral, BID, Rosalin Hawking, MD, 20 mg at 10/10/14 0928 . metoprolol (LOPRESSOR) tablet 50 mg, 50 mg, Oral, BID, Rosalin Hawking, MD,  50 mg at 10/10/14 0928 . spironolactone (ALDACTONE) tablet 25 mg, 25 mg, Oral, Daily, Jacolyn Reedy, MD, 25 mg at 10/10/14 7824  Patients Current Diet: Diet Heart Room service appropriate?: Yes; Fluid consistency:: Thin  Precautions / Restrictions Precautions Precautions: Fall Precaution Comments: difficulty with full WB left leg in standing Restrictions Weight Bearing Restrictions: No   Prior Activity Level Community (5-7x/wk): Pt was independent prior to admit, works full time in Product/process development scientist at [employer]. She enjoys relaxing at home watching TV when not at work and she is expecting her first baby girl grandchild any day!   Home Assistive Devices / Equipment Home Assistive Devices/Equipment: None Home Equipment: None  Prior Functional Level Prior Function Level of Independence: Independent  Current Functional Level Cognition  Overall Cognitive Status: Within Functional Limits for tasks assessed Orientation Level: Oriented X4   Extremity Assessment (includes Sensation/Coordination)  Upper Extremity Assessment: LUE deficits/detail LUE Deficits / Details: 3-/5 shoulder flexion LUE Sensation: decreased light touch LUE Coordination: decreased gross motor, decreased fine motor  Lower Extremity Assessment: Defer to PT evaluation LLE Deficits / Details: <3/5 hip flexor, 3+/5 knee extensor, 4-/5 ankle DF LLE Sensation: decreased proprioception LLE Coordination: decreased gross motor    ADLs  Overall ADL's : Needs assistance/impaired Grooming: Set up, Supervision/safety, Sitting Upper Body Dressing : Minimal assistance, Sitting Lower Body Dressing: Moderate assistance, Sit to/from stand Toilet Transfer: Moderate assistance, Ambulation, RW (chair; Min assist for sit  to stand transfer.) Functional mobility during ADLs: Moderate assistance, Rolling walker General ADL Comments: Educated on dressing technique. Encouraged pt to be using left hand for activities and gave her some suggestions.     Mobility  Overal bed mobility: Needs Assistance Bed Mobility: Supine to Sit (to Rt) Supine to sit: Min assist, HOB elevated General bed mobility comments: not assessed    Transfers  Overall transfer level: Needs assistance Equipment used: Rolling walker (2 wheeled) Transfers: Sit to/from Stand Sit to Stand: Min assist Stand pivot transfers: Mod assist (verbal cues for seqeuncing) General transfer comment: assist to boost. cues for technique.    Ambulation / Gait / Stairs / Wheelchair Mobility  Ambulation/Gait Ambulation/Gait assistance: Mod assist Ambulation Distance (Feet): 35 Feet (and 10') Assistive device: Rolling walker (2 wheeled) General Gait Details: facilitation and cues left hip control in stance, cues for left heel strike, foot clearance with facilitation for increased right weight shift in swing Gait Pattern/deviations: Step-through pattern, Decreased dorsiflexion - left, Shuffle, Decreased stance time - left, Narrow base of support, Staggering left    Posture / Balance Balance Overall balance assessment: Needs assistance Sitting-balance support: Bilateral upper extremity supported, Feet supported Sitting balance-Leahy Scale: Fair Postural control: Left lateral lean Standing balance support: Bilateral upper extremity supported Standing balance-Leahy Scale: Poor Standing balance comment: assist to maintain balance with walker and at sink washing hands due to left lateral LOB    Special needs/care consideration BiPAP/CPAP No CPM No Continuous Drip IV No Dialysis No  Life Vest No Oxygen No Special Bed No Trach Size No Wound Vac (area) No  Skin No  Bowel mgmt: Last BM 10/10/14 Bladder  mgmt: Voiding WDL Diabetic mgmt No    Previous Home Environment Living Arrangements: Children (pt lives with her adult son who works evenings) Available Help at Discharge: Family, Available 24 hours/day (sister to assist upon discharge) Type of Home: Apartment Home Layout: One level Home Access: Stairs to enter Entrance Stairs-Rails: Left Entrance Stairs-Number of Steps: 3 Bathroom Shower/Tub: Chiropodist: Standard Home  Care Services: No 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  Discharge Living Setting Plans for Discharge Living Setting: Patient's home, Apartment, Lives with (comment) (lives with her adult son) Type of Home at Discharge: Apartment Discharge Home Layout: One level Discharge Home Access: Stairs to enter Entrance Stairs-Rails: Left Entrance Stairs-Number of Steps: 3 Discharge Bathroom Shower/Tub: Tub/shower unit Discharge Bathroom Toilet: Standard Does the patient have any problems obtaining your medications?: No  Social/Family/Support Systems Patient Roles: Other (Comment) (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 One of pt's sister is local but works as a Glass blower/designer and the other 2 sisters live away. The other 2 sisters may be able to stay with pt. 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 Will admit to inpatient rehab today.  Preadmission Screen Completed By: Retta Diones, 10/10/2014 11:57 AM ______________________________________________________________________  Discussed status with Dr. Naaman Plummer on 10/10/14 at 1156 and received telephone approval for admission today.  Admission Coordinator: Retta Diones, time1156/Date05/10/16          Cosigned by: Meredith Staggers, MD at 10/10/2014 12:03 PM  Revision History     Date/Time User Provider Type Action   10/10/2014 12:03 PM Meredith Staggers, MD Physician Cosign   10/10/2014 11:57 AM Retta Diones, RN Rehab Admission Coordinator Sign

## 2014-10-10 NOTE — Progress Notes (Signed)
Meredith Staggers, MD Physician Signed Physical Medicine and Rehabilitation Consult Note 10/09/2014 5:33 AM  Related encounter: ED to Hosp-Admission (Discharged) from 10/06/2014 in [place] Collapse All        Physical Medicine and Rehabilitation Consult Reason for Consult: Small acute lacunar infarct lateral right thalamus posterior limb right external capsule/right PCA territory Referring Physician: [facility]   HPI: Brandy Roberts is a 44 y.o. right handed female with history of hypertension and tobacco abuse. 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 Admitted 10/06/2014 with decreased balance and left-sided weakness. Blood pressure 196/88. MRI of the brain showed acute lacunar infarct lateral right thalamus posterior limb right external capsule and also the right corona radiata. Superimposed chronic lacunar infarcts in the left corona radiata and left basal ganglia. Echocardiogram with ejection fraction of 55% grade 1 diastolic dysfunction. CT angiogram head and neck with no acute arterial findings. Patient did not receive TPA. Neurology consulted maintain on aspirin and Plavix for CVA prophylaxis. Subcutaneous heparin for DVT prophylaxis. TEE is pending. Physical and occupational therapy evaluations completed with recommendations of physical medicine rehabilitation consult.   Review of Systems  Neurological: Positive for dizziness and weakness.  All other systems reviewed and are negative.  Past Medical History  Diagnosis Date  . Hypertension    Past Surgical History  Procedure Laterality Date  . Cesarean section    . Tonsillectomy    . Ganglion cyst excision     History reviewed. No pertinent family history. Social History:  reports that she has been smoking. She has never used smokeless tobacco. She reports that she does not drink  alcohol or use illicit drugs. Allergies: No Known Allergies Medications Prior to Admission  Medication Sig Dispense Refill  . oxyCODONE-acetaminophen (PERCOCET) 5-325 MG per tablet Take 2 tablets by mouth at bedtime as needed for severe pain. 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 cues for technique. Ambulation/Gait Ambulation/Gait assistance: Mod assist Ambulation Distance (Feet): 35 Feet (and 10') Assistive device: Rolling walker (2 wheeled) General Gait Details: facilitation and cues left hip control in stance, cues for left heel strike, foot clearance with facilitation for increased right weight shift in swing Gait Pattern/deviations: Step-through pattern, Decreased dorsiflexion - left, Shuffle, Decreased stance time - left, Narrow base of support, Staggering left    ADL: ADL Overall ADL's : Needs assistance/impaired Grooming: Set up, Supervision/safety, Sitting Upper Body Dressing :  Minimal assistance, Sitting Lower Body Dressing: Moderate assistance, Sit to/from stand Toilet Transfer: Moderate assistance, Ambulation, RW (chair; Min assist for sit to stand transfer.)  Functional mobility during ADLs: Moderate assistance, Rolling walker General ADL Comments: Educated on dressing technique. Encouraged pt to be using left hand for activities and gave her some suggestions.   Cognition: Cognition Overall Cognitive Status: Within Functional Limits for tasks assessed Orientation Level: Oriented X4 Cognition Arousal/Alertness: Awake/alert Behavior During Therapy: WFL for tasks assessed/performed Overall Cognitive Status: Within Functional Limits for tasks assessed  Blood pressure 189/78, pulse 64, temperature 98.2 F (36.8 C), temperature source Oral, resp. rate 20, height 5\' 6"  (1.676 m), weight 83.6 kg (184 lb 4.9 oz), last menstrual period 10/03/2014, SpO2 100 %. Physical Exam  Vitals reviewed. Constitutional: She appears well-developed.  HENT:  Head: Normocephalic.  Eyes: EOM are normal.  Neck: Normal range of motion. Neck supple. No thyromegaly present.  Cardiovascular: Normal rate and regular rhythm.  Respiratory: Effort normal and breath sounds normal. No respiratory distress.  GI: Soft. Bowel sounds are normal. She exhibits no distension.  Neurological: She is alert.  Patient is a bit anxious. Oriented to person place and date of birth. Follows all commands. Fair awareness of deficits. Left facial weakness. LUE 3 to 3+/5 prox to distal. LLE: 1+ HF to 2+/3- at ankles. Sensation 1-/2 Left side---cannot sense painful stim. Delayed processing and initiation in all 4 limbs. Fair insight and awareness.  Skin: Skin is warm and dry.  Psychiatric:  Flat, generally cooperative.     Lab Results Last 24 Hours    No results found for this or any previous visit (from the past 24 hour(s)).    Imaging Results (Last 48 hours)    Ct Angio Head W/cm &/or Wo  Cm  10/07/2014 CLINICAL DATA: Stroke with left leg pain. EXAM: CT ANGIOGRAPHY HEAD AND NECK TECHNIQUE: Multidetector CT imaging of the head and neck was performed using the standard protocol during bolus administration of intravenous contrast. Multiplanar CT image reconstructions and MIPs were obtained to evaluate the vascular anatomy. Carotid stenosis measurements (when applicable) are obtained utilizing NASCET criteria, using the distal internal carotid diameter as the denominator. CONTRAST: 28mL OMNIPAQUE IOHEXOL 350 MG/ML SOLN COMPARISON: 10/06/2014 FINDINGS: CT HEAD Skull and Sinuses:Negative for fracture or destructive process. The mastoids, middle ears, and imaged paranasal sinuses are clear. Orbits: No acute abnormality. Brain: No evidence of acute infarction, hemorrhage, hydrocephalus, or mass lesion/mass effect. A remote lacunar infarct is again noted in the left centrum semiovale. Known acute infarcts in the right hemisphere are not visualized. No hemorrhagic transformation. No hydrocephalus or shift. CTA NECK Aortic arch: Limited imaging of the arch. No evidence of dissection or notable plaque. There is a three-vessel origin without origin stenosis. Right carotid system: There is no evidence of atherosclerosis, cervical carotid dissection, or stenosis. Tortuous ICA at the skullbase. Left carotid system: There is no evidence of atherosclerosis, cervical carotid dissection, or stenosis. Tortuous ICA at the skullbase. Vertebral arteries:Patent bilateral proximal subclavian arteries. No focal stenosis of the vertebral arteries. There is mild luminal beading of the bilateral V3 segments, without evidence of superimposed dissection. Skeleton: No contributory findings. CTA HEAD Anterior circulation: Anterior communicating artery is present. Balanced carotid arteries. Posterior communicating arteries are not clearly visualized. The bilateral petrous and cavernous carotids are diffusely  narrowed, without abrupt caliber change or cervical carotid involvement suggestive of dissection. This is likely atherosclerotic as there is patchy mural calcification. Stenosis is up to moderate at the petrouscavernous junction on the left. 2 mm inferior outpouching from the right supraclinoid carotid artery without visible associated vessel. Posterior circulation: Markedly the tortuous V4 segments.  No major vessel occlusion, notable stenosis, or aneurysm. Venous sinuses: Patent Anatomic variants: None of significance Delayed phase: No parenchymal enhancement IMPRESSION: 1. No acute arterial findings. 2. No evidence of infarct progression since yesterday. No hemorrhagic conversion. 3. Diffuse bilateral petrous and cavernous carotid stenosis, likely atherosclerotic. Narrowing is up to moderate at the left petrous cavernous junction. 4. Mild beading of the bilateral V3 segments, question fibromuscular dysplasia. 5. 2 mm outpouching from the right supraclinoid carotid, aneurysm or infundibulum with parent vessel below the resolution of CTA. 6. No arterial stenosis in the neck. Electronically Signed By: Monte Fantasia M.D. On: 10/07/2014 13:01   Ct Angio Neck W/cm &/or Wo/cm  10/07/2014 CLINICAL DATA: Stroke with left leg pain. EXAM: CT ANGIOGRAPHY HEAD AND NECK TECHNIQUE: Multidetector CT imaging of the head and neck was performed using the standard protocol during bolus administration of intravenous contrast. Multiplanar CT image reconstructions and MIPs were obtained to evaluate the vascular anatomy. Carotid stenosis measurements (when applicable) are obtained utilizing NASCET criteria, using the distal internal carotid diameter as the denominator. CONTRAST: 35mL OMNIPAQUE IOHEXOL 350 MG/ML SOLN COMPARISON: 10/06/2014 FINDINGS: CT HEAD Skull and Sinuses:Negative for fracture or destructive process. The mastoids, middle ears, and imaged paranasal sinuses are clear. Orbits: No acute  abnormality. Brain: No evidence of acute infarction, hemorrhage, hydrocephalus, or mass lesion/mass effect. A remote lacunar infarct is again noted in the left centrum semiovale. Known acute infarcts in the right hemisphere are not visualized. No hemorrhagic transformation. No hydrocephalus or shift. CTA NECK Aortic arch: Limited imaging of the arch. No evidence of dissection or notable plaque. There is a three-vessel origin without origin stenosis. Right carotid system: There is no evidence of atherosclerosis, cervical carotid dissection, or stenosis. Tortuous ICA at the skullbase. Left carotid system: There is no evidence of atherosclerosis, cervical carotid dissection, or stenosis. Tortuous ICA at the skullbase. Vertebral arteries:Patent bilateral proximal subclavian arteries. No focal stenosis of the vertebral arteries. There is mild luminal beading of the bilateral V3 segments, without evidence of superimposed dissection. Skeleton: No contributory findings. CTA HEAD Anterior circulation: Anterior communicating artery is present. Balanced carotid arteries. Posterior communicating arteries are not clearly visualized. The bilateral petrous and cavernous carotids are diffusely narrowed, without abrupt caliber change or cervical carotid involvement suggestive of dissection. This is likely atherosclerotic as there is patchy mural calcification. Stenosis is up to moderate at the petrouscavernous junction on the left. 2 mm inferior outpouching from the right supraclinoid carotid artery without visible associated vessel. Posterior circulation: Markedly the tortuous V4 segments. No major vessel occlusion, notable stenosis, or aneurysm. Venous sinuses: Patent Anatomic variants: None of significance Delayed phase: No parenchymal enhancement IMPRESSION: 1. No acute arterial findings. 2. No evidence of infarct progression since yesterday. No hemorrhagic conversion. 3. Diffuse bilateral petrous and cavernous  carotid stenosis, likely atherosclerotic. Narrowing is up to moderate at the left petrous cavernous junction. 4. Mild beading of the bilateral V3 segments, question fibromuscular dysplasia. 5. 2 mm outpouching from the right supraclinoid carotid, aneurysm or infundibulum with parent vessel below the resolution of CTA. 6. No arterial stenosis in the neck. Electronically Signed By: Monte Fantasia M.D. On: 10/07/2014 13:01     Assessment/Plan: Diagnosis: right thalamic, PLIC infarct 1. Does the need for close, 24 hr/day medical supervision in concert with the patient's rehab needs make it unreasonable for this patient to be served in a less intensive setting? Yes 2. Co-Morbidities requiring supervision/potential complications: htn, AR 3. Due to bladder management, bowel management, safety, skin/wound care,  disease management, medication administration and patient education, does the patient require 24 hr/day rehab nursing? Yes 4. Does the patient require coordinated care of a physician, rehab nurse, PT (1-2 hrs/day, 5 days/week), OT (1-2 hrs/day, 5 days/week) and SLP (1-2 hrs/day, 5 days/week) to address physical and functional deficits in the context of the above medical diagnosis(es)? Yes Addressing deficits in the following areas: balance, endurance, locomotion, strength, transferring, bowel/bladder control, bathing, dressing, feeding, grooming, toileting, cognition and psychosocial support 5. Can the patient actively participate in an intensive therapy program of at least 3 hrs of therapy per day at least 5 days per week? Yes 6. The potential for patient to make measurable gains while on inpatient rehab is excellent 7. Anticipated functional outcomes upon discharge from inpatient rehab are modified independent and supervision with PT, modified independent and supervision with OT, modified independent and supervision with SLP. 8. Estimated rehab length of stay to reach the above functional  goals is: 14-20 days 9. Does the patient have adequate social supports and living environment to accommodate these discharge functional goals? Yes and Potentially 10. Anticipated D/C setting: Home 11. Anticipated post D/C treatments: HH therapy and Outpatient therapy 12. Overall Rehab/Functional Prognosis: excellent  RECOMMENDATIONS: This patient's condition is appropriate for continued rehabilitative care in the following setting: CIR Patient has agreed to participate in recommended program. Yes Note that insurance prior authorization may be required for reimbursement for recommended care.  Comment: Rehab Admissions Coordinator to follow up.  Thanks,  Meredith Staggers, MD, [facility]     10/09/2014       Revision History     Date/Time User Provider Type Action   10/09/2014 8:41 AM Meredith Staggers, MD Physician Sign   10/09/2014 6:08 AM Cathlyn Parsons, PA-C Physician Assistant Pend   View Details Report       Routing History     Date/Time From To Method   10/09/2014 8:41 AM Meredith Staggers, MD Meredith Staggers, MD In Basket

## 2014-10-10 NOTE — Progress Notes (Signed)
Subjective:  No complaints since last night.  No shortness of breath or chest pain.  She is asking about help paying her bills since she is out of work.  Objective:  Vital Signs in the last 24 hours: BP 198/82 mmHg  Pulse 68  Temp(Src) 98.1 F (36.7 C) (Oral)  Resp 20  Ht 5\' 6"  (1.676 m)  Wt 83.6 kg (184 lb 4.9 oz)  BMI 29.76 kg/m2  SpO2 95%  LMP 10/03/2014  Physical Exam: Pleasant overweight black female in no acute distress Lungs:  Clear  Cardiac:  Regular rhythm, normal S1 and S2, no S3, 2/6 systolic murmur across the aortic valve, 2/6 diastolic murmur noted Abdomen:  Soft, nontender, no masses Extremities:  No edema present  Intake/Output from previous day: 05/09 0701 - 05/10 0700 In: 840 [P.O.:840] Out: -  Weight Filed Weights   10/06/14 1032 10/07/14 0040  Weight: 91.627 kg (202 lb) 83.6 kg (184 lb 4.9 oz)   Telemetry: Sinus rhythm  Assessment/Plan:   1.  Recent stroke in different territories still likely due to small vessel disease and uncontrolled hypertension 2.  Moderate to severe eccentric aortic regurgitation with small nodule seen on aortic valve.  There is no clinical evidence of endocarditis and she has had blood cultures done earlier.  This does not clinically appear to be endocarditis and I suspect this may be an unrelated finding but she does have eccentric aortic regurgitation.  This will need to be followed closely with serial echoes.  No evidence of heart failure or need for intervention at this time. 3.  Cardiomyopathy likely due to uncontrolled hypertension 4.  Obesity 5.  Hypertensive heart disease  Recommendations:  Will initiate hydralazine as well as spironolactone today.  Okay to go to rehabilitation.  Hopefully we will get blood pressure under control when the neurologists feel it is safe to lower it to a lower level.   Kerry Hough  MD [facility]  10/10/2014, 8:17 AM

## 2014-10-11 ENCOUNTER — Inpatient Hospital Stay (HOSPITAL_COMMUNITY): Payer: No Typology Code available for payment source | Admitting: Speech Pathology

## 2014-10-11 ENCOUNTER — Inpatient Hospital Stay (HOSPITAL_COMMUNITY): Payer: No Typology Code available for payment source

## 2014-10-11 ENCOUNTER — Inpatient Hospital Stay (HOSPITAL_COMMUNITY): Payer: No Typology Code available for payment source | Admitting: Rehabilitation

## 2014-10-11 ENCOUNTER — Inpatient Hospital Stay (HOSPITAL_COMMUNITY): Payer: No Typology Code available for payment source | Admitting: Occupational Therapy

## 2014-10-11 DIAGNOSIS — I63431 Cerebral infarction due to embolism of right posterior cerebral artery: Secondary | ICD-10-CM

## 2014-10-11 DIAGNOSIS — M79609 Pain in unspecified limb: Secondary | ICD-10-CM

## 2014-10-11 DIAGNOSIS — I10 Essential (primary) hypertension: Secondary | ICD-10-CM

## 2014-10-11 LAB — CBC WITH DIFFERENTIAL/PLATELET
BASOS PCT: 0 % (ref 0–1)
Basophils Absolute: 0 10*3/uL (ref 0.0–0.1)
Eosinophils Absolute: 0.1 10*3/uL (ref 0.0–0.7)
Eosinophils Relative: 3 % (ref 0–5)
HEMATOCRIT: 40.2 % (ref 36.0–46.0)
Hemoglobin: 13 g/dL (ref 12.0–15.0)
LYMPHS PCT: 43 % (ref 12–46)
Lymphs Abs: 2 10*3/uL (ref 0.7–4.0)
MCH: 28 pg (ref 26.0–34.0)
MCHC: 32.3 g/dL (ref 30.0–36.0)
MCV: 86.6 fL (ref 78.0–100.0)
MONO ABS: 0.5 10*3/uL (ref 0.1–1.0)
MONOS PCT: 10 % (ref 3–12)
NEUTROS ABS: 2.1 10*3/uL (ref 1.7–7.7)
NEUTROS PCT: 44 % (ref 43–77)
Platelets: 146 10*3/uL — ABNORMAL LOW (ref 150–400)
RBC: 4.64 MIL/uL (ref 3.87–5.11)
RDW: 14.6 % (ref 11.5–15.5)
WBC: 4.7 10*3/uL (ref 4.0–10.5)

## 2014-10-11 LAB — COMPREHENSIVE METABOLIC PANEL
ALBUMIN: 3.3 g/dL — AB (ref 3.5–5.0)
ALK PHOS: 57 U/L (ref 38–126)
ALT: 16 U/L (ref 14–54)
ANION GAP: 9 (ref 5–15)
AST: 18 U/L (ref 15–41)
BUN: 17 mg/dL (ref 6–20)
CO2: 22 mmol/L (ref 22–32)
Calcium: 9 mg/dL (ref 8.9–10.3)
Chloride: 109 mmol/L (ref 101–111)
Creatinine, Ser: 0.82 mg/dL (ref 0.44–1.00)
GLUCOSE: 96 mg/dL (ref 70–99)
POTASSIUM: 3.6 mmol/L (ref 3.5–5.1)
Sodium: 140 mmol/L (ref 135–145)
Total Bilirubin: 0.7 mg/dL (ref 0.3–1.2)
Total Protein: 6.9 g/dL (ref 6.5–8.1)

## 2014-10-11 MED ORDER — HYDRALAZINE HCL 50 MG PO TABS
50.0000 mg | ORAL_TABLET | Freq: Three times a day (TID) | ORAL | Status: DC
  Administered 2014-10-11 – 2014-10-18 (×20): 50 mg via ORAL
  Filled 2014-10-11 (×23): qty 1

## 2014-10-11 NOTE — Progress Notes (Deleted)
Notified PA at approx. 2215 on 10/10/2014, regarding Patient's BP at 215/66 two hours after PM BP meds were given. Patient was asymptomatic, denied any discomfort at that time  BP taken manual was 210/90. New order given to give 0.1mg  Clonidine PO now,  positive result noted, BP 170/72  one hour later. Will continue to monitor during shift.

## 2014-10-11 NOTE — Progress Notes (Signed)
44 y.o. right handed female with history of hypertension and tobacco abuse. 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 Admitted 10/06/2014 with decreased balance and left-sided weakness. Blood pressure 196/88.UDS positive THC. MRI of the brain showed acute lacunar infarct lateral right thalamus posterior limb right external capsule and also the right corona radiata. Superimposed chronic lacunar infarcts in the left corona radiata and left basal ganglia. Echocardiogram with ejection fraction of 16% grade 1 diastolic dysfunction. CT angiogram head and neck with no acute arterial findings. Patient did not receive TPA.Venous doppler negative.  Subjective/Complaints:   Objective: Vital Signs: Blood pressure 157/55, pulse 58, temperature 98.3 F (36.8 C), temperature source Oral, resp. rate 18, height '5\' 6"'  (1.676 m), weight 82.691 kg (182 lb 4.8 oz), last menstrual period 10/03/2014, SpO2 100 %. No results found. Results for orders placed or performed during the hospital encounter of 10/10/14 (from the past 72 hour(s))  CBC     Status: None   Collection Time: 10/10/14  5:38 PM  Result Value Ref Range   WBC 5.3 4.0 - 10.5 K/uL   RBC 4.82 3.87 - 5.11 MIL/uL   Hemoglobin 13.5 12.0 - 15.0 g/dL   HCT 41.9 36.0 - 46.0 %   MCV 86.9 78.0 - 100.0 fL   MCH 28.0 26.0 - 34.0 pg   MCHC 32.2 30.0 - 36.0 g/dL   RDW 14.6 11.5 - 15.5 %   Platelets 162 150 - 400 K/uL  Creatinine, serum     Status: None   Collection Time: 10/10/14  5:38 PM  Result Value Ref Range   Creatinine, Ser 0.93 0.44 - 1.00 mg/dL   GFR calc non Af Amer >60 >60 mL/min   GFR calc Af Amer >60 >60 mL/min    Comment: (NOTE) The eGFR has been calculated using the CKD EPI equation. This calculation has not been validated in all clinical situations. eGFR's persistently <60 mL/min signify possible Chronic Kidney Disease.   CBC WITH DIFFERENTIAL     Status: Abnormal    Collection Time: 10/11/14  5:30 AM  Result Value Ref Range   WBC 4.7 4.0 - 10.5 K/uL   RBC 4.64 3.87 - 5.11 MIL/uL   Hemoglobin 13.0 12.0 - 15.0 g/dL   HCT 40.2 36.0 - 46.0 %   MCV 86.6 78.0 - 100.0 fL   MCH 28.0 26.0 - 34.0 pg   MCHC 32.3 30.0 - 36.0 g/dL   RDW 14.6 11.5 - 15.5 %   Platelets 146 (L) 150 - 400 K/uL   Neutrophils Relative % 44 43 - 77 %   Neutro Abs 2.1 1.7 - 7.7 K/uL   Lymphocytes Relative 43 12 - 46 %   Lymphs Abs 2.0 0.7 - 4.0 K/uL   Monocytes Relative 10 3 - 12 %   Monocytes Absolute 0.5 0.1 - 1.0 K/uL   Eosinophils Relative 3 0 - 5 %   Eosinophils Absolute 0.1 0.0 - 0.7 K/uL   Basophils Relative 0 0 - 1 %   Basophils Absolute 0.0 0.0 - 0.1 K/uL  Comprehensive metabolic panel     Status: Abnormal   Collection Time: 10/11/14  5:30 AM  Result Value Ref Range   Sodium 140 135 - 145 mmol/L   Potassium 3.6 3.5 - 5.1 mmol/L   Chloride 109 101 - 111 mmol/L   CO2 22 22 - 32 mmol/L   Glucose, Bld 96 70 - 99 mg/dL  BUN 17 6 - 20 mg/dL   Creatinine, Ser 0.82 0.44 - 1.00 mg/dL   Calcium 9.0 8.9 - 10.3 mg/dL   Total Protein 6.9 6.5 - 8.1 g/dL   Albumin 3.3 (L) 3.5 - 5.0 g/dL   AST 18 15 - 41 U/L   ALT 16 14 - 54 U/L   Alkaline Phosphatase 57 38 - 126 U/L   Total Bilirubin 0.7 0.3 - 1.2 mg/dL   GFR calc non Af Amer >60 >60 mL/min   GFR calc Af Amer >60 >60 mL/min    Comment: (NOTE) The eGFR has been calculated using the CKD EPI equation. This calculation has not been validated in all clinical situations. eGFR's persistently <60 mL/min signify possible Chronic Kidney Disease.    Anion gap 9 5 - 15     HEENT: normal Cardio: RRR and no murmur Resp: CTA B/L and unlabored GI: BS positive and NT, ND Extremity:  Pulses positive and No Edema Skin:   Intact Neuro: Alert/Oriented, Flat, Cranial Nerve II-XII normal, Abnormal Sensory reduced sensation in left hand and left foot, Abnormal Motor 4/5 in Left delt, bi, tri, grip, HF, KE ADF and Abnormal FMC Ataxic/ dec  FMC Musc/Skel:  Normal Gen NAD   Assessment/Plan: 1. Functional deficits secondary to right thalamic/PLIC infarct which require 3+ hours per day of interdisciplinary therapy in a comprehensive inpatient rehab setting. Physiatrist is providing close team supervision and 24 hour management of active medical problems listed below. Physiatrist and rehab team continue to assess barriers to discharge/monitor patient progress toward functional and medical goals. FIM:                   Comprehension Comprehension Mode: Auditory Comprehension: 5-Follows basic conversation/direction: With no assist  Expression Expression Mode: Verbal Expression: 5-Expresses basic needs/ideas: With no assist  Social Interaction Social Interaction: 7-Interacts appropriately with others - No medications needed.  Problem Solving Problem Solving: 5-Solves basic problems: With no assist  Memory Memory: 5-Recognizes or recalls 90% of the time/requires cueing < 10% of the time  Medical Problem List and Plan: 1. Functional deficits secondary to right thalamic/PLIC infarct 2.  DVT Prophylaxis/Anticoagulation: Subcutaneous heparin. Monitor platelet counts and any signs of bleeding.Vascular study negative 3. Pain Management: Tylenol as needed 4. Hypertension. Lisinopril 20 mg twice a day, Lopressor 50 mg twice a day, hydralazine 25 mg 3 times a day, Aldactone 25 mg daily. Monitor with increased mobility, Rena artery ultrasound 5. Neuropsych: This patient is capable of making decisions on her own behalf. 6. Skin/Wound Care: Routine skin checks 7. Fluids/Electrolytes/Nutrition: follow I&O's with follow-up chemistries 8. Tobacco/Marijuana abuse. Counseling 9. Hyperlipidemia. Lipitor   LOS (Days) 1 A FACE TO FACE EVALUATION WAS PERFORMED  KIRSTEINS,ANDREW E 10/11/2014, 7:42 AM

## 2014-10-11 NOTE — Progress Notes (Signed)
At 1800 pm patient blood pressure 180/82 given Hydralazine. Given report to on-coming to continue to monitor blood pressure.

## 2014-10-11 NOTE — Progress Notes (Signed)
Notified NP at approx. 2215 on 10/10/2014, regarding Patient's BP result of 215/66 two hours after PM BP meds were given. Patient was asymptomatic, denied any discomfort at that time. BP taken manual was 210/90. New order given to give 0.1mg  Clonidine PO now, positive result noted, BP 170/72 one hour later. Will continue to monitor during shift.

## 2014-10-11 NOTE — Progress Notes (Signed)
At 1500 pm patient noted to have a BP with dinamap of 201/73, and rechecked manually with BP of 196/72 . Patient is asymptomatic with no complaints of H/A, dizziness, or lightheadedness. Notified Dan A., PA of BP he stated Wynonia Lawman, MD made some adjustments with Hydralazine. Given order to administer Hydralazine 50 mg at 1600 pm. Will continue to monitor patient.

## 2014-10-11 NOTE — Evaluation (Signed)
Speech Language Pathology Assessment and Plan  Patient Details  Name: Brandy Roberts MRN: [id number] Date of Birth: [birth year]  SLP Diagnosis: Dysarthria;Speech and Language deficits;Cognitive Impairments  Rehab Potential: Good ELOS: 10-14 days     Today's Date: 10/11/2014 SLP Individual Time: 5697-9480 SLP Individual Time Calculation (min): 58 min   Problem List:  Patient Active Problem List   Diagnosis Date Noted  . Acute ischemic VBA thalamic stroke 10/10/2014  . Aortic valve vegetation   . Stroke   . Hypertensive heart disease   . Overweight (BMI 25.0-29.9)   . H/O noncompliance with medical treatment, presenting hazards to health   . Aortic regurgitation   . Essential hypertension   . Tobacco use disorder 10/07/2014  . Polysubstance abuse 10/07/2014  . Malignant hypertension   . Cerebral infarction due to thrombosis of right middle cerebral artery   . Hyperlipidemia   . Embolic stroke involving right middle cerebral artery 10/06/2014  . Embolic stroke involving right posterior cerebral artery 10/06/2014   Past Medical History:  Past Medical History  Diagnosis Date  . Hypertension   . Stroke    Past Surgical History:  Past Surgical History  Procedure Laterality Date  . Cesarean section    . Tonsillectomy    . Ganglion cyst excision    . Tee without cardioversion N/A 10/09/2014    Procedure: TRANSESOPHAGEAL ECHOCARDIOGRAM (TEE);  Surgeon: Sanda Klein, MD;  Location: [facility] ENDOSCOPY;  Service: Cardiovascular;  Laterality: N/A;    Assessment / Plan / Recommendation Clinical Impression  Brandy Roberts is a 44 y.o. right handed female with history of hypertension and tobacco abuse. 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 Admitted 10/06/2014 with decreased balance and left-sided weakness.  MRI of the brain showed acute lacunar infarct lateral right thalamus posterior limb right external  capsule and also the right corona radiata. Superimposed chronic lacunar infarcts in the left corona radiata and left basal ganglia.  Patient was admitted for a comprehensive rehabilitation program 10/10/2014.  SLP evaluation completed on 10/11/2014 with the following results:  Pt presents with mild cognitive deficits for higher level problem solving and emergent awareness characterized by decreased planning, self monitoring, and correction of errors.  Pt also presents with mildly impaired memory impacting recall of new information.  Additionally, pt demonstrates a mild-moderate hypokinetic dysarthria characterized by impaired oral motor weakness and mildly impaired lingual strength for tongue elevation and lateral movements.  This results in inconsistent part word repetitions of initial syllables and overall decreased fluency in conversations.  Pt also exhibits mildly decreased articulatory precision resulting in slurred speech.  Due to the abovementioned deficits s/p stroke, pt no requires assistance for completing familiar home management tasks independently and would therefore benefit from skilled ST services while inpatient in order to maximize functional independence and reduce burden of care prior to discharge.  Anticipate that pt will need at least intermittent supervision at discharge and assistance for medication and financial management.  Pt would also likely benefit from follow up ST at discharge in either the home health or outpatient setting.    Skilled Therapeutic Interventions          Cognitive-linguistic evaluation completed with results and recommendations reviewed with patient.     SLP Assessment  Patient will need skilled [place] [facility]ology Services during CIR admission    Recommendations  Patient destination: Home Follow up Recommendations: 24 hour supervision/assistance;Home Health SLP;Outpatient SLP Equipment Recommended: None recommended  by SLP    SLP Frequency 3 to 5 out  of 7 days   SLP Treatment/Interventions Cognitive remediation/compensation;Cueing hierarchy;Functional tasks;Patient/family education;Oral motor exercises;Internal/external aids;Environmental controls    Pain Pain Assessment Pain Assessment: No/denies pain Pain Score: 0-No pain Prior Functioning Cognitive/Linguistic Baseline: Within functional limits Type of Home: Apartment  Lives With: Son Available Help at Discharge: Family;Available 24 hours/day Education: completed high school Vocation: Full time employment  Short Term Goals: Week 1: SLP Short Term Goal 1 (Week 1): Pt will recognize and correct errors in the moment with min assist verbal cues when completing semi-complex home management/self care tasks over three consecutive sessions  SLP Short Term Goal 2 (Week 1): Pt will utilize dysarthria strategies to achieve fluency in conversations with min assist verbal and visual cues.   SLP Short Term Goal 3 (Week 1): Pt will utllize external aids to facilitate recall of new information with supervision cues.  See FIM for current functional status Refer to Care Plan for Long Term Goals  Recommendations for other services: None  Discharge Criteria: Patient will be discharged from SLP if patient refuses treatment 3 consecutive times without medical reason, if treatment goals not met, if there is a change in medical status, if patient makes no progress towards goals or if patient is discharged from hospital.  The above assessment, treatment plan, treatment alternatives and goals were discussed and mutually agreed upon: by patient  Emilio Math 10/11/2014, 12:31 PM

## 2014-10-11 NOTE — Progress Notes (Signed)
Social Work Assessment and Plan Social Work Assessment and Plan  Patient Details  Name: Brandy Roberts MRN: [id number] Date of Birth: Nov 10, 1970  Today's Date: 10/11/2014  Problem List:  Patient Active Problem List   Diagnosis Date Noted  . Acute ischemic VBA thalamic stroke 10/10/2014  . Aortic valve vegetation   . Stroke   . Hypertensive heart disease   . Overweight (BMI 25.0-29.9)   . H/O noncompliance with medical treatment, presenting hazards to health   . Aortic regurgitation   . Essential hypertension   . Tobacco use disorder 10/07/2014  . Polysubstance abuse 10/07/2014  . Malignant hypertension   . Cerebral infarction due to thrombosis of right middle cerebral artery   . Hyperlipidemia   . Embolic stroke involving right middle cerebral artery 10/06/2014  . Embolic stroke involving right posterior cerebral artery 10/06/2014   Past Medical History:  Past Medical History  Diagnosis Date  . Hypertension   . Stroke    Past Surgical History:  Past Surgical History  Procedure Laterality Date  . Cesarean section    . Tonsillectomy    . Ganglion cyst excision    . Tee without cardioversion N/A 10/09/2014    Procedure: TRANSESOPHAGEAL ECHOCARDIOGRAM (TEE);  Surgeon: Sanda Klein, MD;  Location: [facility] ENDOSCOPY;  Service: Cardiovascular;  Laterality: N/A;   Social History:  reports that she has been smoking.  She has never used smokeless tobacco. She reports that she does not drink alcohol or use illicit drugs.  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  She has sister's who are involved but they work and tow live out of town.  Pt is hoping she will be independent by the time she leaves here.  Social History Preferred language: English Religion:  Cultural Background: No issues Education: High School Read: Yes Write: Yes Employment Status: Employed Name of Employer: [employer] Return to Work Plans: Wants to return but unsure if will be able too Freight forwarder Issues: No issues Guardian/Conservator: None-according to MD pt is capable of making her own decisions while here   Abuse/Neglect Physical Abuse: Denies Verbal Abuse: Denies Sexual Abuse: Denies Exploitation of patient/patient's resources: Denies Self-Neglect: Denies  Emotional Status Pt's affect, behavior adn adjustment status: Pt is motivated to improve and regain her independence again, she has always been able to do for herself and does not like having to ask for assistance.  She does need education regaridng her HTN and how important it is to keep it controlled and that she takes her medicines. Recent Psychosocial Issues: She is aware she had HTN but didn't take meds due to made her feel bad-been 2 years now Pyschiatric History: No history deferred depression screen due to family in room aqnd pt tired from therapies.  Will see when alone and address this, may benefit from neuro-psych while here Substance Abuse History: Positive for THC and opiates-[place]oz[place] it is an issue and plans to quit, was unaware coulod make her health issues worse  Patient / Family  Perceptions, Expectations & Goals Pt/Family understanding of illness & functional limitations: Pt and sister can explain her stroke, but need much education regarding her HTN and connection to CVA's and  information of preventing another one. Pt does ask MD questions when he rounds. RN to educate on stroke prevention. Premorbid pt/family roles/activities: Mom, Employee, Soon to be grandma, sister, church member, etc Anticipated changes in roles/activities/participation: resume Pt/family expectations/goals: Pt states: " I want to do for myself, I need to get back to work."  Sister states: " I hope she does well here, she is young."  Recruitment consultant: None Premorbid Home Care/DME Agencies: None Transportation available at discharge: family and friends Resource referrals recommended: Neuropsychology, Support group (specify)  Discharge Planning Living Arrangements: Children Support Systems: Children, Other relatives, Water engineer, Social worker community Type of Residence: Private residence Insurance underwriter Resources: Multimedia programmer (specify) Museum/gallery curator) Financial Resources: Employment Museum/gallery curator Screen Referred: Yes Living Expenses: Education officer, community Management: Patient Does the patient have any problems obtaining your medications?: Yes (Describe) (Was not seeing a MD for her HTN) Home Management: Patient and son Patient/Family Preliminary Plans: Return home with son, but he is there in the am.  There will be times pt is alone, so needs to be mod/i before going home. Sister's plan to stop by daily but unsure if can be there for a number of hours.  Await team's evaluations Social Work Anticipated Follow Up Needs: HH/OP, Support Group  Clinical Impression Pleasant female who was aware of her HTN but stopped taking medicines since they made her feel bad.  She is aware now how important they are to take and control her blood pressure. Supportive son and sister's, her son's girlfriend having a baby today sometime at [employer]. Pt aware she can not leave and go see her. Have provided resources regarding assistance with her bills and Rent.  She is concerned about falling behind.  Will  work on discharge plans and eligible community resources.  Elease Hashimoto 10/11/2014, 3:32 PM

## 2014-10-11 NOTE — Progress Notes (Signed)
Subjective:  Currently participating in rehab.  NO cardiac c/o  Objective:  Vital Signs in the last 24 hours: BP 188/68 mmHg  Pulse 53  Temp(Src) 98.3 F (36.8 C) (Oral)  Resp 18  Ht 5\' 6"  (1.676 m)  Wt 82.691 kg (182 lb 4.8 oz)  BMI 29.44 kg/m2  SpO2 100%  LMP 10/03/2014  Physical Exam: Pleasant overweight black female in no acute distress Lungs:  Clear  Cardiac:  Regular rhythm, normal S1 and S2, no S3, 2/6 systolic murmur across the aortic valve, 2/6 diastolic murmur noted Abdomen:  Soft, nontender, no masses Extremities:  No edema present  Intake/Output from previous day:   Weight Filed Weights   10/10/14 1544  Weight: 82.691 kg (182 lb 4.8 oz)   Telemetry: Sinus rhythm  Assessment/Plan:   1.  Recent stroke in different territories still likely due to small vessel disease and uncontrolled hypertension 2.  Moderate to severe eccentric aortic regurgitation with small nodule seen on aortic valve.  There is no clinical evidence of endocarditis and she has had blood cultures done earlier.  This does not clinically appear to be endocarditis and I suspect this may be an unrelated finding but she does have eccentric aortic regurgitation.  This will need to be followed closely with serial echoes.  No evidence of heart failure or need for intervention at this time. 3.  Cardiomyopathy likely due to uncontrolled hypertension 4.  Obesity 5.  Hypertensive heart disease blood pressure is still not controlled  Recommendations:  Titrate hydralazine and spironolactone. Discussed importance of BP control with her.  Blood cultures negative to date.    Kerry Hough  MD [facility]  10/11/2014, 2:12 PM

## 2014-10-11 NOTE — Progress Notes (Signed)
Patient information reviewed and entered into eRehab system by Prezley Qadir, RN, CRRN, PPS Coordinator.  Information including medical coding and functional independence measure will be reviewed and updated through discharge.    

## 2014-10-11 NOTE — Progress Notes (Signed)
*  PRELIMINARY RESULTS* Vascular Ultrasound Renal Artery Duplex has been completed.  Preliminary findings: No evidence of RAS bilaterally. Mildly elevated intrarenal resistive indices, etiology unknown.  Landry Mellow, RDMS, RVT  10/11/2014, 11:33 AM

## 2014-10-11 NOTE — Evaluation (Signed)
Physical Therapy Assessment and Plan  Patient Details  Name: Brandy Roberts MRN: [id number] Date of Birth: [birth year]  PT Diagnosis: Abnormality of gait, Cognitive deficits, Difficulty walking, Hemiparesis non-dominant, Impaired sensation and Muscle weakness Rehab Potential: Excellent ELOS: 12-14 days    Today's Date: 10/11/2014 PT Individual Time: 1300-1430 PT Individual Time Calculation (min): 90 min    Problem List:  Patient Active Problem List   Diagnosis Date Noted  . Acute ischemic VBA thalamic stroke 10/10/2014  . Aortic valve vegetation   . Stroke   . Hypertensive heart disease   . Overweight (BMI 25.0-29.9)   . H/O noncompliance with medical treatment, presenting hazards to health   . Aortic regurgitation   . Essential hypertension   . Tobacco use disorder 10/07/2014  . Polysubstance abuse 10/07/2014  . Malignant hypertension   . Cerebral infarction due to thrombosis of right middle cerebral artery   . Hyperlipidemia   . Embolic stroke involving right middle cerebral artery 10/06/2014  . Embolic stroke involving right posterior cerebral artery 10/06/2014    Past Medical History:  Past Medical History  Diagnosis Date  . Hypertension   . Stroke    Past Surgical History:  Past Surgical History  Procedure Laterality Date  . Cesarean section    . Tonsillectomy    . Ganglion cyst excision    . Tee without cardioversion N/A 10/09/2014    Procedure: TRANSESOPHAGEAL ECHOCARDIOGRAM (TEE);  Surgeon: Sanda Klein, MD;  Location: [facility] ENDOSCOPY;  Service: Cardiovascular;  Laterality: N/A;    Assessment & Plan Clinical Impression: Patient is a 44 y.o. year old female with history of hypertension and tobacco abuse. 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 Admitted 10/06/2014 with decreased balance and left-sided weakness. Blood pressure 196/88.UDS positive THC. MRI of the brain showed acute  lacunar infarct lateral right thalamus posterior limb right external capsule and also the right corona radiata. Superimposed chronic lacunar infarcts in the left corona radiata and left basal ganglia. Echocardiogram with ejection fraction of 48% grade 1 diastolic dysfunction. CT angiogram head and neck with no acute arterial findings. Patient did not receive TPA.Venous doppler negative.  Patient transferred to CIR on 10/10/2014 .   Patient currently requires mod with mobility secondary to muscle weakness, decreased cardiorespiratoy endurance and unbalanced muscle activation and decreased coordination.  Prior to hospitalization, patient was independent  with mobility and lived with Son, Family in a [place] home.  Home access is 3Stairs to enter.  Patient will benefit from skilled PT intervention to maximize safe functional mobility, minimize fall risk and decrease caregiver burden for planned discharge home with intermittent assist.  Anticipate patient will benefit from follow up OP at discharge.  PT - End of Session Activity Tolerance: Tolerates 30+ min activity with multiple rests Endurance Deficit: Yes PT Assessment Rehab Potential (ACUTE/IP ONLY): Excellent Barriers to Discharge: Decreased caregiver support PT Patient demonstrates impairments in the following area(s): Balance;Endurance;Motor;Safety;Sensory PT Transfers Functional Problem(s): Bed Mobility;Bed to Chair;Car;Furniture;Floor PT Locomotion Functional Problem(s): Ambulation;Stairs PT Plan PT Intensity: Minimum of 1-2 x/day ,45 to 90 minutes PT Frequency: 5 out of 7 days PT Duration Estimated Length of Stay: 12-14 days  PT Treatment/Interventions: Ambulation/gait training;Balance/vestibular training;Cognitive remediation/compensation;Community reintegration;Discharge planning;Disease management/prevention;DME/adaptive equipment instruction;Functional mobility training;Neuromuscular re-education;Pain management;Patient/family  education;Psychosocial support;Splinting/orthotics;Stair training;Therapeutic Activities;Therapeutic Exercise;UE/LE Strength taining/ROM;UE/LE Coordination activities;Wheelchair propulsion/positioning PT Transfers Anticipated Outcome(s): mod I PT Locomotion Anticipated Outcome(s): mod I  PT Recommendation Follow Up Recommendations: Outpatient PT Patient destination:  Home Equipment Recommended: To be determined  Skilled Therapeutic Intervention PT assessment and evaluation completed, see full details below.  Initiated gait training with use of RW.  Note that pt with narrow BOS and tends to hyperextend L knee esp with fatigue, therefore donned ace wrap for DF assist and to prevent L knee recurvatum.  Note improvement on L clearance, however continued to need tactile cues to prevent further knee hyperextension as well to stabilize L hip and increase L glute activation.  Performed stair training with cues for sequencing and technique and tactile cues at knee for stability.  Initiated NMR treatment of LLE with sit<>stands with small block under RLE to increase WB and weight shift through LLE with hand placement on LLE.  Utilized Geologist, engineering during [employer] tasks to increase visual feedback due to poor sensation.  Also performed pregait task with advancing and retrostepping RLE to increase WB through LLE with focus on increased glute activation.   Tolerated well. Assisted back to room and educated on ELOS, expected outcomes, equipment and rehab schedule.  Pt verbalized understanding.  All needs in reach.    PT Evaluation Precautions/Restrictions Precautions Precautions: Fall Precaution Comments: difficulty with full WB left leg in standing Restrictions Weight Bearing Restrictions: No General   Vital SignsTherapy Vitals Temp: 97.9 F (36.6 C) Temp Source: Oral Pulse Rate: 62 Resp: 18 BP: (!) 153/59 mmHg (RN notified) Patient Position (if appropriate): Lying Oxygen Therapy SpO2: 100 % O2 Device: Not  Delivered Pain:  No reports of pain during session.    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  Cognition Overall Cognitive Status: Impaired/Different from baseline Arousal/Alertness: Awake/alert Orientation Level: Oriented X4 Attention: Selective Selective Attention: Appears intact Memory: Impaired Memory Impairment: Decreased recall of new information Awareness: Impaired Awareness Impairment: Emergent impairment Safety/Judgment: Appears intact Sensation Sensation Light Touch: Impaired Detail Light Touch Impaired Details: Impaired LLE Stereognosis: Not tested Hot/Cold: Not tested Proprioception: Impaired Detail Proprioception Impaired Details: Impaired LLE Coordination Gross Motor Movements are Fluid and Coordinated: No Fine Motor Movements are Fluid and Coordinated: No Heel Shin Test: unable to perform due to weakness Motor  Motor Motor: Hemiplegia Motor - Skilled Clinical Observations: decreased activity tolerance, decreased balance, Hemiplegia with L LE more involved than L UE  Mobility Bed Mobility Bed Mobility: Supine to Sit;Sit to Supine Supine to Sit: 5: Supervision Supine to Sit Details: Verbal cues for sequencing;Verbal cues  for technique;Verbal cues for precautions/safety Sit to Supine: 5: Supervision Sit to Supine - Details: Verbal cues for sequencing;Verbal cues for technique;Verbal cues for precautions/safety Transfers Transfers: Yes Sit to Stand: 4: Min assist;3: Mod assist Sit to Stand Details: Tactile cues for weight shifting;Verbal cues for technique;Verbal cues for precautions/safety;Verbal cues for safe use of DME/AE;Verbal cues for sequencing Stand to Sit: 3: Mod assist;4: Min assist Stand to Sit Details (indicate cue type and reason): Verbal cues for precautions/safety;Verbal cues for technique;Verbal cues for  sequencing;Verbal cues for safe use of DME/AE Stand Pivot Transfers: 4: Min assist;3: Mod assist Stand Pivot Transfer Details: Verbal cues for sequencing;Verbal cues for technique;Verbal cues for precautions/safety;Manual facilitation for weight shifting Locomotion  Ambulation Ambulation: Yes Ambulation/Gait Assistance: 4: Min assist;3: Mod assist Ambulation Distance (Feet): 90 Feet (x 2 and another 50' x 2 reps) Assistive device: Rolling walker;None Ambulation/Gait Assistance Details: Verbal cues for sequencing;Verbal cues for technique;Verbal cues for precautions/safety;Manual facilitation for weight shifting;Manual facilitation for weight bearing Gait Gait: Yes Gait Pattern: Impaired Gait Pattern: Decreased stride length;Poor foot clearance - left;Narrow base of support;Trunk flexed;Left foot flat;Decreased weight shift to left;Decreased stance time - left;Decreased step length - right Stairs / Additional Locomotion Stairs: Yes Stairs Assistance: 4: Min assist Stairs Assistance Details: Verbal cues for sequencing;Verbal cues for technique;Verbal cues for precautions/safety;Manual facilitation for weight shifting Stair Management Technique: Two rails;Step to pattern;Forwards Number of Stairs: 4 Height of Stairs: 6 Wheelchair Mobility Wheelchair Mobility: Yes Wheelchair Assistance: 4:  Advertising account executive Details: Tactile cues for initiation;Verbal cues for sequencing;Verbal cues for technique;Verbal cues for Information systems manager: Both upper extremities Wheelchair Parts Management: Needs assistance Distance: 100  Trunk/Postural Assessment  Cervical Assessment Cervical Assessment: Within Functional Limits Thoracic Assessment Thoracic Assessment: Within Functional Limits Lumbar Assessment Lumbar Assessment: Within Functional Limits Postural Control Postural Control: Deficits on evaluation Protective Responses: delayed protective responses during dynamic balance with LOB to the L.   Balance Balance Balance Assessed: Yes Static Sitting Balance Static Sitting - Balance Support: Feet supported Static Sitting - Level of Assistance: 6: Modified independent (Device/Increase time) Dynamic Sitting Balance Dynamic Sitting - Balance Support: Feet supported;During functional activity Dynamic Sitting - Level of Assistance: 5: Stand by assistance Static Standing Balance Static Standing - Balance Support: During functional activity Static Standing - Level of Assistance: 4: Min assist Dynamic Standing Balance Dynamic Standing - Balance Support: During functional activity Dynamic Standing - Level of Assistance: 3: Mod assist Extremity Assessment      RLE Assessment RLE Assessment: Within Functional Limits LLE Assessment LLE Assessment: Exceptions to [facility] LLE Strength LLE Overall Strength: Deficits LLE Overall Strength Comments: hip flex 2/5, hip ext 2+/5, knee ext 2/5, knee flex 1/5, ankle PF 2/5, ankle DF 1/5  FIM:  FIM - Bed/Chair Transfer Bed/Chair Transfer Assistive Devices: Arm rests Bed/Chair Transfer: 5: Supine > Sit: Supervision (verbal cues/safety issues);5: Sit > Supine: Supervision (verbal cues/safety issues);3: Bed > Chair or W/C: Mod A (lift or lower assist);3: Chair or W/C > Bed: Mod A (lift or lower assist) FIM - Locomotion:  Wheelchair Distance: 100 Locomotion: Wheelchair: 2: Travels 50 - 149 ft with minimal assistance (Pt.>75%) FIM - Locomotion: Ambulation Locomotion: Ambulation Assistive Devices: Other (comment) (no AD and RW) Ambulation/Gait Assistance: 4: Min assist;3: Mod assist Locomotion: Ambulation: 2: Travels 50 - 149 ft with moderate assistance (Pt: 50 - 74%) FIM - Locomotion: Stairs Locomotion: Scientist, physiological: Hand rail - 2 Locomotion: Stairs: 2: Up and Down 4 - 11 stairs with minimal assistance (Pt.>75%)   Refer to Care Plan for Long Term Goals  Recommendations for other services: None  Discharge Criteria: Patient will be discharged from PT if patient refuses treatment 3 consecutive times without medical reason, if treatment goals not met, if there is a change in medical status, if patient makes no progress towards goals or if patient is discharged from hospital.  The above assessment, treatment plan, treatment alternatives and goals were discussed and mutually agreed upon: by patient  Denice Bors 10/11/2014,  4:43 PM

## 2014-10-11 NOTE — Care Management Note (Signed)
Inpatient Rehabilitation Center Individual Statement of Services  Patient Name:  Brandy Roberts  Date:  10/11/2014  Welcome to the [facility].  Our goal is to provide you with an individualized program based on your diagnosis and situation, designed to meet your specific needs.  With this comprehensive rehabilitation program, you will be expected to participate in at least 3 hours of rehabilitation therapies Monday-Friday, with modified therapy programming on the weekends.  Your rehabilitation program will include the following services:  Physical Therapy (PT), Occupational Therapy (OT), Speech Therapy (ST), 24 hour per day rehabilitation nursing, Therapeutic Recreaction (TR), Neuropsychology, Case Management (Social Worker), Rehabilitation Medicine, Nutrition Services and Pharmacy Services  Weekly team conferences will be held on Wednesday to discuss your progress.  Your Social Worker will talk with you frequently to get your input and to update you on team discussions.  Team conferences with you and your family in attendance may also be held.  Expected length of stay: 12-14 days  Overall anticipated outcome: mod/i-supervision level  Depending on your progress and recovery, your program may change. Your Social Worker will coordinate services and will keep you informed of any changes. Your Social Worker's name and contact numbers are listed  below.  The following services may also be recommended but are not provided by the [facility] will be made to provide these services after discharge if needed.  Arrangements include referral to agencies that provide these services.  Your insurance has been verified to be:  [employer]ff Your primary doctor is:  none  Pertinent information will be shared with your doctor  and your insurance company.  Social Worker:  Ovidio Kin, [place] or ([phone]  Information discussed with and copy given to patient by: Elease Hashimoto, 10/11/2014, 2:10 PM

## 2014-10-11 NOTE — Progress Notes (Signed)
Occupational Therapy Assessment and Plan  Patient Details  Name: Brandy Roberts MRN: [id number] Date of Birth: 04-23-1971  OT Diagnosis: cognitive deficits, hemiplegia affecting non-dominant side, muscle weakness (generalized) and decreased sensation Rehab Potential: Rehab Potential (ACUTE ONLY): Good (for stated goals) ELOS: 12-14 days   Today's Date: 10/11/2014 OT Individual Time: 0800-0900 OT Individual Time Calculation (min): 60 min     Problem List:  Patient Active Problem List   Diagnosis Date Noted  . Acute ischemic VBA thalamic stroke 10/10/2014  . Aortic valve vegetation   . Stroke   . Hypertensive heart disease   . Overweight (BMI 25.0-29.9)   . H/O noncompliance with medical treatment, presenting hazards to health   . Aortic regurgitation   . Essential hypertension   . Tobacco use disorder 10/07/2014  . Polysubstance abuse 10/07/2014  . Malignant hypertension   . Cerebral infarction due to thrombosis of right middle cerebral artery   . Hyperlipidemia   . Embolic stroke involving right middle cerebral artery 10/06/2014  . Embolic stroke involving right posterior cerebral artery 10/06/2014    Past Medical History:  Past Medical History  Diagnosis Date  . Hypertension   . Stroke    Past Surgical History:  Past Surgical History  Procedure Laterality Date  . Cesarean section    . Tonsillectomy    . Ganglion cyst excision    . Tee without cardioversion N/A 10/09/2014    Procedure: TRANSESOPHAGEAL ECHOCARDIOGRAM (TEE);  Surgeon: Sanda Klein, MD;  Location: [facility] ENDOSCOPY;  Service: Cardiovascular;  Laterality: N/A;    Assessment & Plan Clinical Impression: Patient is a 44 y.o. year old female with history of hypertension and tobacco abuse. 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 Admitted 10/06/2014 with decreased balance and left-sided weakness. Blood pressure 196/88.UDS positive  THC. MRI of the brain showed acute lacunar infarct lateral right thalamus posterior limb right external capsule and also the right corona radiata. Superimposed chronic lacunar infarcts in the left corona radiata and left basal ganglia. Echocardiogram with ejection fraction of 36% grade 1 diastolic dysfunction. CT angiogram head and neck with no acute arterial findings. Patient did not receive TPA.Venous doppler negative. Neurology consulted maintain on aspirin and Plavix for CVA prophylaxis. Subcutaneous heparin for DVT prophylaxis. TEE completed showing moderate to severe eccentric aortic regurgitation no evidence of endocarditis.[name] Physical and occupational therapy evaluations completed with recommendations of physical medicine rehabilitation consult. Patient was admitted for a comprehensive rehabilitation program .  Patient transferred to CIR on 10/10/2014 .    Patient currently requires mod with basic self-care skills secondary to muscle weakness, decreased cardiorespiratoy endurance, decreased coordination, decreased sensation, decreased awareness, decreased problem solving and decreased safety awareness and decreased standing balance, decreased postural control and hemiplegia.  Prior to hospitalization, patient could complete ADLs and IADLs with independent .  Patient will benefit from skilled intervention to increase independence with basic self-care skills and increase level of independence with iADL prior to discharge home with care partner.  Anticipate patient will require intermittent supervision and follow up OT services.  OT - End of Session Activity Tolerance: Decreased this session Endurance Deficit: Yes Endurance Deficit Description: multiple rest breaks during self care tasks secondary to fatigue OT Assessment Rehab Potential (ACUTE ONLY): Good (for stated goals) Barriers to Discharge:  (none known at this time) OT Patient demonstrates impairments in the following area(s):  Balance;Sensory;Cognition;Endurance;Motor;Perception;Safety OT Basic ADL's Functional Problem(s): Grooming;Bathing;Dressing;Toileting OT Advanced ADL's Functional Problem(s):  Simple Meal Preparation;Laundry OT Transfers Functional Problem(s): Toilet;Tub/Shower OT Additional Impairment(s): Fuctional Use of Upper Extremity OT Plan OT Intensity: Minimum of 1-2 x/day, 45 to 90 minutes OT Frequency: 5 out of 7 days OT Duration/Estimated Length of Stay: 12-14 days OT Treatment/Interventions: Balance/vestibular training;Discharge planning;Self Care/advanced ADL retraining;Therapeutic Activities;UE/LE Coordination activities;Cognitive remediation/compensation;Functional mobility training;Patient/family education;Therapeutic Exercise;Community reintegration;DME/adaptive equipment instruction;Neuromuscular re-education;Psychosocial support;UE/LE Strength taining/ROM OT Self Feeding Anticipated Outcome(s): n/a OT Basic Self-Care Anticipated Outcome(s): overall Mod I OT Toileting Anticipated Outcome(s): overall Mod I OT Bathroom Transfers Anticipated Outcome(s): overall Mod I  OT Recommendation Recommendations for Other Services:  (none at this time) Patient destination: Home Follow Up Recommendations: Home health OT;Outpatient OT;24 hour supervision/assistance Equipment Recommended: Tub/shower seat   Skilled Therapeutic Intervention Upon entering the room, pt supine in bed with no c/o pain. OT educating pt on OT purpose, POC, and goals with pt verbalizing understanding and agreeable. Pt performed supine >sit with min A for trunk in side lying position. Mod A STS from standard height bed. Pt ambulating with RW to bathroom ~ 15 feet with Mod A for weight shift and safety. Pt declined toilet this session. Mod A stand pivot transfer into walk in shower with use of RW and grab bars. Pt seated on shower chair for bathing tasks with min verbal cues to utilize L UE for functional tasks such as washing body which  pt was observed to be doing. Pt ambulated back to bed in same manner and engaged in dressing tasks seated on EOB. Pt transferring with Mod A stand pivot into recliner chair at end of session. Call bell and all needed items within reach upon exiting the room.   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  Lives With: Son IADL History Education: completed high school Prior Function Level of Independence: Independent with basic ADLs, Independent with homemaking with ambulation, Independent with transfers, Independent with gait  Able to Take Stairs?: Yes Vocation: Full time employment Leisure: Hobbies-yes (Comment) Comments: spending time with family Vision/Perception  Vision- History Baseline Vision/History: Wears glasses Wears Glasses: At all times Patient Visual Report: No change from baseline Vision- Assessment Vision Assessment?: Yes Visual Fields: No apparent deficits  Cognition Overall Cognitive Status: Impaired/Different from baseline Arousal/Alertness: Awake/alert Orientation Level: Oriented X4 Attention: Selective Selective Attention: Appears intact Memory: Impaired Memory Impairment: Decreased recall of new information Awareness: Impaired Awareness Impairment: Emergent impairment Problem Solving: Impaired Problem Solving Impairment: Functional complex Executive Function: Self Monitoring;Self Correcting Self Monitoring:  Impaired Self Monitoring Impairment: Functional complex Self Correcting: Impaired Safety/Judgment: Appears intact Sensation Sensation Light Touch: Impaired Detail Light Touch Impaired Details: Absent LLE;Impaired RUE Stereognosis: Not tested Hot/Cold: Impaired Detail Hot/Cold Impaired Details: Impaired LUE;Impaired LLE Proprioception: Impaired by gross assessment Coordination Gross Motor Movements are Fluid and Coordinated: No Fine Motor Movements are Fluid and Coordinated: No Motor  Motor Motor: Hemiplegia Motor - Skilled Clinical Observations: decreased activity tolerance, decreased balance, Hemiplegia with L LE more involved than L UE Mobility  Transfers Transfers: Sit to Stand;Stand to Sit Sit to Stand: 3: Mod assist Sit to Stand Details: Tactile cues for weight shifting;Verbal  cues for technique;Verbal cues for precautions/safety;Verbal cues for safe use of DME/AE Stand to Sit: 3: Mod assist;4: Min assist Stand to Sit Details (indicate cue type and reason): Verbal cues for precautions/safety;Verbal cues for technique;Verbal cues for sequencing;Verbal cues for safe use of DME/AE  Trunk/Postural Assessment  Cervical Assessment Cervical Assessment: Within Functional Limits Thoracic Assessment Thoracic Assessment: Within Functional Limits Lumbar Assessment Lumbar Assessment: Within Functional Limits Postural Control Postural Control: Deficits on evaluation Protective Responses: delayed protective responses during dynamic balance with LOB to the L.   Balance Balance Balance Assessed: Yes Static Sitting Balance Static Sitting - Balance Support: Feet supported Static Sitting - Level of Assistance: 6: Modified independent (Device/Increase time) Dynamic Sitting Balance Dynamic Sitting - Balance Support: Feet supported;During functional activity Dynamic Sitting - Level of Assistance: 5: Stand by assistance Static Standing Balance Static Standing - Balance Support: During  functional activity Static Standing - Level of Assistance: 4: Min assist Dynamic Standing Balance Dynamic Standing - Balance Support: During functional activity Dynamic Standing - Level of Assistance: 3: Mod assist Extremity/Trunk Assessment RUE Assessment RUE Assessment: Within Functional Limits LUE Assessment LUE Assessment: Exceptions to [facility] (3-/5  gross strength throughout)  FIM:  FIM - Grooming Grooming Steps: Wash, rinse, dry face;Wash, rinse, dry hands;Oral care, brush teeth, clean dentures Grooming: 4: Steadying assist  or patient completes 3 of 4 or 4 of 5 steps FIM - Bathing Bathing Steps Patient Completed: Chest;Right Arm;Left Arm;Abdomen;Right upper leg;Left upper leg;Front perineal area Bathing: 3: Mod-Patient completes 5-7 68f10 parts or 50-74% FIM - Upper Body Dressing/Undressing Upper body dressing/undressing steps patient completed: Thread/unthread right bra strap;Thread/unthread left bra strap;Hook/unhook bra;Thread/unthread right sleeve of pullover shirt/dresss;Thread/unthread left sleeve of pullover shirt/dress;Put head through opening of pull over shirt/dress;Pull shirt over trunk Upper body dressing/undressing: 5: Set-up assist to: Obtain clothing/put away FIM - Lower Body Dressing/Undressing Lower body dressing/undressing steps patient completed: Thread/unthread right underwear leg;Thread/unthread left underwear leg;Pull underwear up/down;Thread/unthread right pants leg;Thread/unthread left pants leg;Pull pants up/down Lower body dressing/undressing: 3: Mod-Patient completed 50-74% of tasks FIM - BControl and instrumentation engineerDevices: WCopy 4: Supine > Sit: Min A (steadying Pt. > 75%/lift 1 leg);3: Bed > Chair or W/C: Mod A (lift or lower assist) FIM - TSystems developerDevices: Shower chair;Grab bars;Walker;Walk in shower Tub/shower Transfers: 3-Into Tub/Shower: Mod A (lift or lower/lift 2 legs);3-Out  of Tub/Shower: Mod A (lift or lower/lift 2 legs)   Refer to Care Plan for Long Term Goals  Recommendations for other services: None  Discharge Criteria: Patient will be discharged from OT if patient refuses treatment 3 consecutive times without medical reason, if treatment goals not met, if there is a change in medical status, if patient makes no progress towards goals or if patient is discharged from hospital.  The above assessment, treatment plan, treatment alternatives and goals were discussed and mutually agreed upon: by patient  PPhineas Semen5/04/2015, 11:57 AM

## 2014-10-12 ENCOUNTER — Inpatient Hospital Stay (HOSPITAL_COMMUNITY): Payer: No Typology Code available for payment source | Admitting: Rehabilitation

## 2014-10-12 ENCOUNTER — Encounter (HOSPITAL_COMMUNITY): Payer: No Typology Code available for payment source

## 2014-10-12 ENCOUNTER — Inpatient Hospital Stay (HOSPITAL_COMMUNITY): Payer: No Typology Code available for payment source | Admitting: Speech Pathology

## 2014-10-12 ENCOUNTER — Inpatient Hospital Stay (HOSPITAL_COMMUNITY): Payer: No Typology Code available for payment source | Admitting: Occupational Therapy

## 2014-10-12 MED ORDER — TRAMADOL HCL 50 MG PO TABS
50.0000 mg | ORAL_TABLET | Freq: Four times a day (QID) | ORAL | Status: DC | PRN
  Administered 2014-10-12: 50 mg via ORAL
  Filled 2014-10-12: qty 1

## 2014-10-12 MED ORDER — AMLODIPINE BESYLATE 5 MG PO TABS
5.0000 mg | ORAL_TABLET | Freq: Every day | ORAL | Status: DC
Start: 2014-10-12 — End: 2014-10-16
  Administered 2014-10-12 – 2014-10-16 (×5): 5 mg via ORAL
  Filled 2014-10-12 (×8): qty 1

## 2014-10-12 MED ORDER — HYDROCHLOROTHIAZIDE 25 MG PO TABS
25.0000 mg | ORAL_TABLET | Freq: Every day | ORAL | Status: DC
  Administered 2014-10-12 – 2014-10-18 (×7): 25 mg via ORAL
  Filled 2014-10-12 (×8): qty 1

## 2014-10-12 NOTE — Progress Notes (Signed)
Subjective:  Currently participating in rehab.  NO cardiac c/o.  Blood pressure remains elevated  Objective:  Vital Signs in the last 24 hours: BP 188/94 mmHg  Pulse 72  Temp(Src) 98.2 F (36.8 C) (Oral)  Resp 17  Ht 5\' 6"  (1.676 m)  Wt 82.691 kg (182 lb 4.8 oz)  BMI 29.44 kg/m2  SpO2 98%  LMP 10/03/2014  Physical Exam: Pleasant overweight black female in no acute distress Lungs:  Clear  Cardiac:  Regular rhythm, normal S1 and S2, no S3, 2/6 systolic murmur across the aortic valve, 2/6 diastolic murmur noted Extremities:  No edema present  Intake/Output from previous day: 05/11 0701 - 05/12 0700 In: 560 [P.O.:560] Out: -  Weight Filed Weights   10/10/14 1544  Weight: 82.691 kg (182 lb 4.8 oz)   Current facility-administered medications:  .  acetaminophen (TYLENOL) tablet 325-650 mg, 325-650 mg, Oral, Q4H PRN, Lavon Paganini Angiulli, PA-C .  aspirin EC tablet 81 mg, 81 mg, Oral, Daily, Lavon Paganini Angiulli, PA-C, 81 mg at 10/12/14 0934 .  atorvastatin (LIPITOR) tablet 10 mg, 10 mg, Oral, q1800, Lavon Paganini Angiulli, PA-C, 10 mg at 10/11/14 1753 .  bisacodyl (DULCOLAX) EC tablet 10 mg, 10 mg, Oral, Daily PRN, Lavon Paganini Angiulli, PA-C .  clopidogrel (PLAVIX) tablet 75 mg, 75 mg, Oral, Daily, Lavon Paganini Angiulli, PA-C, 75 mg at 10/12/14 0933 .  docusate sodium (COLACE) capsule 100 mg, 100 mg, Oral, BID, Lavon Paganini Angiulli, PA-C, 100 mg at 10/11/14 2103 .  heparin injection 5,000 Units, 5,000 Units, Subcutaneous, 3 times per day, Cathlyn Parsons, PA-C, 5,000 Units at 10/12/14 [phone] .  hydrALAZINE (APRESOLINE) tablet 50 mg, 50 mg, Oral, TID WC, Jacolyn Reedy, MD, 50 mg at 10/12/14 1233 .  lisinopril (PRINIVIL,ZESTRIL) tablet 20 mg, 20 mg, Oral, BID, Lavon Paganini Angiulli, PA-C, 20 mg at 10/12/14 0933 .  metoprolol (LOPRESSOR) tablet 50 mg, 50 mg, Oral, BID, Lavon Paganini Angiulli, PA-C, 50 mg at 10/12/14 0934 .  ondansetron (ZOFRAN) tablet 4 mg, 4 mg, Oral, Q6H PRN **OR** ondansetron (ZOFRAN)  injection 4 mg, 4 mg, Intravenous, Q6H PRN, Lavon Paganini Angiulli, PA-C .  sorbitol 70 % solution 30 mL, 30 mL, Oral, Daily PRN, Lavon Paganini Angiulli, PA-C, 30 mL at 10/11/14 1753 .  spironolactone (ALDACTONE) tablet 25 mg, 25 mg, Oral, Daily, Lavon Paganini Angiulli, PA-C, 25 mg at 10/12/14 4081   Telemetry: Sinus rhythm  Assessment/Plan:   1.  Recent stroke in different territories still likely due to small vessel disease and uncontrolled hypertension 2.  Moderate to severe eccentric aortic regurgitation with small nodule seen on aortic valve.  There is no clinical evidence of endocarditis and she has had blood cultures done earlier.  This does not clinically appear to be endocarditis and I suspect this may be an unrelated finding but she does have eccentric aortic regurgitation.  This will need to be followed closely with serial echoes.  No evidence of heart failure or need for intervention at this time. 3.  Cardiomyopathy likely due to uncontrolled hypertension 4.  Obesity 5.  Hypertensive heart disease blood pressure is still not controlled.  -Suspect that she will have resistant hypertension.  I will add amlodipine 5 mg daily and also add hydrochlorothiazide to her regimen.    She will need to have a primary care follow-up set up on discharge.  May need to involve care management.    Kerry Hough  MD [facility]  10/12/2014, 12:53 PM

## 2014-10-12 NOTE — Progress Notes (Signed)
44 y.o. right handed female with history of hypertension and tobacco abuse. 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 Admitted 10/06/2014 with decreased balance and left-sided weakness. Blood pressure 196/88.UDS positive THC. MRI of the brain showed acute lacunar infarct lateral right thalamus posterior limb right external capsule and also the right corona radiata. Superimposed chronic lacunar infarcts in the left corona radiata and left basal ganglia. Echocardiogram with ejection fraction of 76% grade 1 diastolic dysfunction. CT angiogram head and neck with no acute arterial findings. Patient did not receive TPA.Venous doppler negative.  Subjective/Complaints: No issues overnite Appreciate cardiology note Discussed med adjustments may take a couple days to take full effect Objective: Vital Signs: Blood pressure 181/65, pulse 61, temperature 98.2 F (36.8 C), temperature source Oral, resp. rate 17, height '5\' 6"'  (1.676 m), weight 82.691 kg (182 lb 4.8 oz), last menstrual period 10/03/2014, SpO2 98 %. No results found. Results for orders placed or performed during the hospital encounter of 10/10/14 (from the past 72 hour(s))  CBC     Status: None   Collection Time: 10/10/14  5:38 PM  Result Value Ref Range   WBC 5.3 4.0 - 10.5 K/uL   RBC 4.82 3.87 - 5.11 MIL/uL   Hemoglobin 13.5 12.0 - 15.0 g/dL   HCT 41.9 36.0 - 46.0 %   MCV 86.9 78.0 - 100.0 fL   MCH 28.0 26.0 - 34.0 pg   MCHC 32.2 30.0 - 36.0 g/dL   RDW 14.6 11.5 - 15.5 %   Platelets 162 150 - 400 K/uL  Creatinine, serum     Status: None   Collection Time: 10/10/14  5:38 PM  Result Value Ref Range   Creatinine, Ser 0.93 0.44 - 1.00 mg/dL   GFR calc non Af Amer >60 >60 mL/min   GFR calc Af Amer >60 >60 mL/min    Comment: (NOTE) The eGFR has been calculated using the CKD EPI equation. This calculation has not been validated in all clinical situations. eGFR's  persistently <60 mL/min signify possible Chronic Kidney Disease.   CBC WITH DIFFERENTIAL     Status: Abnormal   Collection Time: 10/11/14  5:30 AM  Result Value Ref Range   WBC 4.7 4.0 - 10.5 K/uL   RBC 4.64 3.87 - 5.11 MIL/uL   Hemoglobin 13.0 12.0 - 15.0 g/dL   HCT 40.2 36.0 - 46.0 %   MCV 86.6 78.0 - 100.0 fL   MCH 28.0 26.0 - 34.0 pg   MCHC 32.3 30.0 - 36.0 g/dL   RDW 14.6 11.5 - 15.5 %   Platelets 146 (L) 150 - 400 K/uL   Neutrophils Relative % 44 43 - 77 %   Neutro Abs 2.1 1.7 - 7.7 K/uL   Lymphocytes Relative 43 12 - 46 %   Lymphs Abs 2.0 0.7 - 4.0 K/uL   Monocytes Relative 10 3 - 12 %   Monocytes Absolute 0.5 0.1 - 1.0 K/uL   Eosinophils Relative 3 0 - 5 %   Eosinophils Absolute 0.1 0.0 - 0.7 K/uL   Basophils Relative 0 0 - 1 %   Basophils Absolute 0.0 0.0 - 0.1 K/uL  Comprehensive metabolic panel     Status: Abnormal   Collection Time: 10/11/14  5:30 AM  Result Value Ref Range   Sodium 140 135 - 145 mmol/L   Potassium 3.6 3.5 - 5.1 mmol/L   Chloride 109 101 - 111 mmol/L   CO2  22 22 - 32 mmol/L   Glucose, Bld 96 70 - 99 mg/dL   BUN 17 6 - 20 mg/dL   Creatinine, Ser 0.82 0.44 - 1.00 mg/dL   Calcium 9.0 8.9 - 10.3 mg/dL   Total Protein 6.9 6.5 - 8.1 g/dL   Albumin 3.3 (L) 3.5 - 5.0 g/dL   AST 18 15 - 41 U/L   ALT 16 14 - 54 U/L   Alkaline Phosphatase 57 38 - 126 U/L   Total Bilirubin 0.7 0.3 - 1.2 mg/dL   GFR calc non Af Amer >60 >60 mL/min   GFR calc Af Amer >60 >60 mL/min    Comment: (NOTE) The eGFR has been calculated using the CKD EPI equation. This calculation has not been validated in all clinical situations. eGFR's persistently <60 mL/min signify possible Chronic Kidney Disease.    Anion gap 9 5 - 15     HEENT: normal Cardio: RRR and no murmur Resp: CTA B/L and unlabored GI: BS positive and NT, ND Extremity:  Pulses positive and No Edema Skin:   Intact Neuro: Alert/Oriented, Flat, Cranial Nerve II-XII normal, Abnormal Sensory reduced sensation  in left hand and left foot, Abnormal Motor 4/5 in Left delt, bi, tri, grip, HF, KE ADF and Abnormal FMC Ataxic/ dec FMC Musc/Skel:  Normal Gen NAD   Assessment/Plan: 1. Functional deficits secondary to right thalamic/PLIC infarct which require 3+ hours per day of interdisciplinary therapy in a comprehensive inpatient rehab setting. Physiatrist is providing close team supervision and 24 hour management of active medical problems listed below. Physiatrist and rehab team continue to assess barriers to discharge/monitor patient progress toward functional and medical goals. FIM: FIM - Bathing Bathing Steps Patient Completed: Chest, Right Arm, Left Arm, Abdomen, Right upper leg, Left upper leg, Front perineal area Bathing: 3: Mod-Patient completes 5-7 46f10 parts or 50-74%  FIM - Upper Body Dressing/Undressing Upper body dressing/undressing steps patient completed: Thread/unthread right bra strap, Thread/unthread left bra strap, Hook/unhook bra, Thread/unthread right sleeve of pullover shirt/dresss, Thread/unthread left sleeve of pullover shirt/dress, Put head through opening of pull over shirt/dress, Pull shirt over trunk Upper body dressing/undressing: 5: Set-up assist to: Obtain clothing/put away FIM - Lower Body Dressing/Undressing Lower body dressing/undressing steps patient completed: Thread/unthread right underwear leg, Thread/unthread left underwear leg, Pull underwear up/down, Thread/unthread right pants leg, Thread/unthread left pants leg, Pull pants up/down Lower body dressing/undressing: 3: Mod-Patient completed 50-74% of tasks        FIM - BControl and instrumentation engineerDevices: Arm rests Bed/Chair Transfer: 5: Supine > Sit: Supervision (verbal cues/safety issues), 5: Sit > Supine: Supervision (verbal cues/safety issues), 3: Bed > Chair or W/C: Mod A (lift or lower assist), 3: Chair or W/C > Bed: Mod A (lift or lower assist)  FIM - Locomotion:  Wheelchair Distance: 100 Locomotion: Wheelchair: 2: Travels 50 - 149 ft with minimal assistance (Pt.>75%) FIM - Locomotion: Ambulation Locomotion: Ambulation Assistive Devices: Other (comment) (no AD and RW) Ambulation/Gait Assistance: 4: Min assist, 3: Mod assist Locomotion: Ambulation: 2: Travels 50 - 149 ft with moderate assistance (Pt: 50 - 74%)  Comprehension Comprehension Mode: Auditory Comprehension: 5-Follows basic conversation/direction: With no assist  Expression Expression Mode: Verbal Expression: 5-Expresses basic needs/ideas: With no assist  Social Interaction Social Interaction: 7-Interacts appropriately with others - No medications needed.  Problem Solving Problem Solving: 5-Solves basic problems: With no assist  Memory Memory: 5-Recognizes or recalls 90% of the time/requires cueing < 10% of the time  Medical Problem  List and Plan: 1. Functional deficits secondary to right thalamic/PLIC infarct 2.  DVT Prophylaxis/Anticoagulation: Subcutaneous heparin. Monitor platelet counts and any signs of bleeding.Vascular study negative 3. Pain Management: Tylenol as needed 4. Hypertension. Lisinopril 20 mg twice a day, Lopressor 50 mg twice a day, hydralazine 25 mg 3 times a day, Aldactone 25 mg daily. Monitor with increased mobility, Renal artery ultrasound 5. Neuropsych: This patient is capable of making decisions on her own behalf. 6. Skin/Wound Care: Routine skin checks 7. Fluids/Electrolytes/Nutrition: follow I&O's with follow-up chemistries 8. Tobacco/Marijuana abuse. Counseling 9. Hyperlipidemia. Lipitor   LOS (Days) 2 A FACE TO FACE EVALUATION WAS PERFORMED  KIRSTEINS,ANDREW E 10/12/2014, 7:20 AM

## 2014-10-12 NOTE — Progress Notes (Signed)
Occupational Therapy Session Note  Patient Details  Name: Brandy Roberts MRN: [id number] Date of Birth: 11-26-1970  Today's Date: 10/12/2014 OT Individual Time: 0800-0900 OT Individual Time Calculation (min): 60 min    Short Term Goals: Week 1:  OT Short Term Goal 1 (Week 1): Pt will perform bathing with Min A in order to increase i in self care tasks.  OT Short Term Goal 2 (Week 1): Pt will perform toilet transfer with Min A in order to increase I in functional transfers. OT Short Term Goal 3 (Week 1): Pt will perform LB dressing with steady assist and modifications as needed in order to increase I in self care task. OT Short Term Goal 4 (Week 1): Pt will engage in 10 minutes of dynamic standing task before needing rest break in order to increase I for occupational performance.  Skilled Therapeutic Interventions/Progress Updates:  Upon entering the room, pt seated on EOB awaiting therapist with friend present in room. Pt reporting tingling in L LE as well as reports ability to feel water temperature more in shower this session. Pt requesting shower this session and all clothing items gathered prior to therapist arrival. STS from bed with min guard and ambulation with RW into bathroom to sit onto shower chair with Min A. Bathing from shower level with Min A this session as well. Pt utilizing L UE at nondominant level during shower task but included in self care tasks without verbal cues to do so. Pt ambulating in same manner to sit on EOB for dressing tasks. Pt required min verbal cues for hemiplegic dressing technique. Pt also able to donn and doff B shoes and tie laces this session. Pt ambulated to ADL apartment ~ 75 feet to practice sofa transfer min A from low surface. Pt returning to room with RW and min guard assist as session progresses secondary to fatigue pt knee locking into extension. Pt standing at sink for grooming tasks with min guard and seated in recliner chair at end of session  as neuropsych entering the room.   Therapy Documentation Precautions:  Precautions Precautions: Fall Precaution Comments: difficulty with full WB left leg in standing Restrictions Weight Bearing Restrictions: No  See FIM for current functional status  Therapy/Group: Individual Therapy  Phineas Semen 10/12/2014, 12:06 PM

## 2014-10-12 NOTE — Progress Notes (Signed)
Physical Therapy Session Note  Patient Details  Name: Darlen Gledhill MRN: [id number] Date of Birth: [birth year]  Today's Date: 10/12/2014 PT Individual Time: 0102-7253 PT Individual Time Calculation (min): 82 min   Short Term Goals: Week 1:  PT Short Term Goal 1 (Week 1): Pt will perform dynamic standing tasks with single UE support at min A level PT Short Term Goal 2 (Week 1): Pt will perform functional transfers at min A level PT Short Term Goal 3 (Week 1): Pt will ambulate x 100' w/ LRAD at min A level PT Short Term Goal 4 (Week 1): Pt will negotiate up/down 3 STE with R rail at min A level and min cues for safety and technique.   Skilled Therapeutic Interventions/Progress Updates:   Skilled session focused on gait training with use of RW with use of hinged knee brace with straps crossed in back to assist prevent L recurvatum.  Note that this showed marked improvement in gait pattern.  Continue to note slight L toe drag but did not lose balance due to this.  Discussed possible need for toe cap, however will continue to assess as she continues to gait strength.  Ambulated x 125' x 2 reps at S to min/guard level with RW as above.  Slight facilitation at L hip for increased hip protraction.  Remainder of session focused on dynamic tasks for NMR through [place] with tall kneeling, quadruped, transitioning from tall kneeling to half kneeling with RLE for WB and weight shift to LLE, also with tasks for reaching with LUE for fine motor control and coordintation.  Progressed to standing task stepping and retrostepping R then LLE up/down to 6" step with focus on WB through LLE as well as hip/knee and ankle DF in LLE when stepping to step. Requires min A for task.  Ended session with supine tasks with LLE off EOM, performing LLE only bridging x 3 sets of 10 reps, bridging with BLE on physioball x 2 sets of 10 reps>bridging with hamstring curl on physioball x 10 reps with assist to L knee flex.  Pt with  increased c/o head ache and dizziness, therefore assisted into sitting.  BP was 181/72, RN made aware and states to assist pt back to room.  Transferred back to bed at min A level.  Left in bed with bed alarm set and all needs in reach, RN to call cardiologist and provided pain meds for headache.    Therapy Documentation Precautions:  Precautions Precautions: Fall Precaution Comments: difficulty with full WB left leg in standing Restrictions Weight Bearing Restrictions: No   Vital Signs: Therapy Vitals Pulse Rate: 72 BP: (!) 188/94 mmHg Patient Position (if appropriate): Sitting   Pt with c/o headache and dizziness at end of session.  RN made aware, BP 181/72, assisted pt back to room and to bed.   Pain: Pt with no c/o pain during session:   See FIM for current functional status  Therapy/Group: Individual Therapy  Denice Bors 10/12/2014, 1:52 PM

## 2014-10-12 NOTE — Progress Notes (Signed)
Speech Language Pathology Daily Session Note  Patient Details  Name: Brandy Roberts MRN: [id number] Date of Birth: 03-15-1971  Today's Date: 10/12/2014 SLP Individual Time: 1005-1103 SLP Individual Time Calculation (min): 58 min  Short Term Goals: Week 1: SLP Short Term Goal 1 (Week 1): Pt will recognize and correct errors in the moment with min assist verbal cues when completing semi-complex home management/self care tasks over three consecutive sessions  SLP Short Term Goal 2 (Week 1): Pt will utilize dysarthria strategies to achieve fluency in conversations with min assist verbal and visual cues.   SLP Short Term Goal 3 (Week 1): Pt will utllize external aids to facilitate recall of new information with supervision cues.  Skilled Therapeutic Interventions:  Pt was seen for skilled ST targeting cognitive goals.  Upon arrival, pt was seated upright in recliner, awake, alert, and agreeable to participate in [place]. Pt was transferred to wheelchair propelled herself to the ST treatment room with supervision cues for route recall from yesterday's evaluation.  Pt completed remaining portions of the CLQT from yesterday with results indicating mild-moderate deficits for executive function and recall.  SLP facilitated the session with a structured medication management task targeting planning, organization, and error awareness for home management.  Pt required max assist verbal cues to recall function of newly scheduled medications when named.  Pt then loaded pills of varying dosages and frequencies into a 4 times per dail pill box with min assist instructional cues which were able to be faded to overall supervision cues with increased task familiarity and repetition of trials.  Will plan to complete medication management task at next available appointment.  Continue per current plan of care.    FIM:  Comprehension Comprehension Mode: Auditory Comprehension: 5-Follows basic conversation/direction:  With no assist Expression Expression Mode: Verbal Expression: 5-Expresses basic needs/ideas: With no assist Social Interaction Social Interaction: 7-Interacts appropriately with others - No medications needed. Problem Solving Problem Solving: 4-Solves basic 75 - 89% of the time/requires cueing 10 - 24% of the time Memory Memory: 4-Recognizes or recalls 75 - 89% of the time/requires cueing 10 - 24% of the time FIM - Eating Eating Activity: 7: Complete independence:no helper  Pain Pain Assessment Pain Assessment: No/denies pain  Therapy/Group: Individual Therapy  Nayana Lenig, Selinda Orion 10/12/2014, 2:53 PM

## 2014-10-13 ENCOUNTER — Inpatient Hospital Stay (HOSPITAL_COMMUNITY): Payer: No Typology Code available for payment source | Admitting: Speech Pathology

## 2014-10-13 ENCOUNTER — Inpatient Hospital Stay (HOSPITAL_COMMUNITY): Payer: No Typology Code available for payment source | Admitting: Occupational Therapy

## 2014-10-13 ENCOUNTER — Inpatient Hospital Stay (HOSPITAL_COMMUNITY): Payer: No Typology Code available for payment source | Admitting: Rehabilitation

## 2014-10-13 DIAGNOSIS — G8190 Hemiplegia, unspecified affecting unspecified side: Secondary | ICD-10-CM

## 2014-10-13 LAB — CATECHOLAMINES, FRACTIONATED, PLASMA
EPINEPHRINE: 25 pg/mL
Norepinephrine: 100 pg/mL
TOTAL CATECHOLAMINES(NOR+ EPI): 125 pg/mL

## 2014-10-13 MED ORDER — PANTOPRAZOLE SODIUM 40 MG PO TBEC
40.0000 mg | DELAYED_RELEASE_TABLET | Freq: Every day | ORAL | Status: DC
  Administered 2014-10-13 – 2014-10-18 (×5): 40 mg via ORAL
  Filled 2014-10-13 (×7): qty 1

## 2014-10-13 NOTE — Progress Notes (Signed)
SUBJECTIVE:  Feeels that she is getting stronger  OBJECTIVE:   Vitals:   Filed Vitals:   10/12/14 1711 10/13/14 0518 10/13/14 0814 10/13/14 1206  BP: 188/88 150/63 152/66 145/68  Pulse: 78 58 61   Temp:  98.3 F (36.8 C)    TempSrc:  Oral    Resp:  6    Height:      Weight:      SpO2:  100%     I&O's:   Intake/Output Summary (Last 24 hours) at 10/13/14 1412 Last data filed at 10/12/14 1825  Gross per 24 hour  Intake    240 ml  Output      0 ml  Net    240 ml        PHYSICAL EXAM General: Well developed, well nourished, in no acute distress Head:   Normal cephalic and atramatic  Lungs:  * Clear bilaterally to auscultation. Heart:   HRRR S1 S2  No JVD.  2/6 systolic murmur, 1/6 diastolic murmur Abdomen: abdomen soft and non-tender Msk:  Back normal,  Normal strength and tone for age. Extremities:   No edema.   Neuro: Alert and oriented. Psych:  Normal affect, responds appropriately Skin: No rash   LABS: Basic Metabolic Panel:  Recent Labs  10/10/14 1738 10/11/14 0530  NA  --  140  K  --  3.6  CL  --  109  CO2  --  22  GLUCOSE  --  96  BUN  --  17  CREATININE 0.93 0.82  CALCIUM  --  9.0   Liver Function Tests:  Recent Labs  10/11/14 0530  AST 18  ALT 16  ALKPHOS 57  BILITOT 0.7  PROT 6.9  ALBUMIN 3.3*   No results for input(s): LIPASE, AMYLASE in the last 72 hours. CBC:  Recent Labs  10/10/14 1738 10/11/14 0530  WBC 5.3 4.7  NEUTROABS  --  2.1  HGB 13.5 13.0  HCT 41.9 40.2  MCV 86.9 86.6  PLT 162 146*   Cardiac Enzymes: No results for input(s): CKTOTAL, CKMB, CKMBINDEX, TROPONINI in the last 72 hours. BNP: Invalid input(s): POCBNP D-Dimer: No results for input(s): DDIMER in the last 72 hours. Hemoglobin A1C: No results for input(s): HGBA1C in the last 72 hours. Fasting Lipid Panel: No results for input(s): CHOL, HDL, LDLCALC, TRIG, CHOLHDL, LDLDIRECT in the last 72 hours. Thyroid Function Tests: No results for input(s):  TSH, T4TOTAL, T3FREE, THYROIDAB in the last 72 hours.  Invalid input(s): FREET3 Anemia Panel: No results for input(s): VITAMINB12, FOLATE, FERRITIN, TIBC, IRON, RETICCTPCT in the last 72 hours. Coag Panel:   Lab Results  Component Value Date   INR 1.08 10/06/2014    RADIOLOGY: Ct Angio Head W/cm &/or Wo Cm  10/07/2014   CLINICAL DATA:  Stroke with left leg pain.  EXAM: CT ANGIOGRAPHY HEAD AND NECK  TECHNIQUE: Multidetector CT imaging of the head and neck was performed using the standard protocol during bolus administration of intravenous contrast. Multiplanar CT image reconstructions and MIPs were obtained to evaluate the vascular anatomy. Carotid stenosis measurements (when applicable) are obtained utilizing NASCET criteria, using the distal internal carotid diameter as the denominator.  CONTRAST:  61mL OMNIPAQUE IOHEXOL 350 MG/ML SOLN  COMPARISON:  10/06/2014  FINDINGS: CT HEAD  Skull and Sinuses:Negative for fracture or destructive process. The mastoids, middle ears, and imaged paranasal sinuses are clear.  Orbits: No acute abnormality.  Brain: No evidence of acute infarction, hemorrhage, hydrocephalus, or mass lesion/mass  effect. A remote lacunar infarct is again noted in the left centrum semiovale. Known acute infarcts in the right hemisphere are not visualized. No hemorrhagic transformation. No hydrocephalus or shift.  CTA NECK  Aortic arch: Limited imaging of the arch. No evidence of dissection or notable plaque. There is a three-vessel origin without origin stenosis.  Right carotid system: There is no evidence of atherosclerosis, cervical carotid dissection, or stenosis. Tortuous ICA at the skullbase.  Left carotid system: There is no evidence of atherosclerosis, cervical carotid dissection, or stenosis. Tortuous ICA at the skullbase.  Vertebral arteries:Patent bilateral proximal subclavian arteries. No focal stenosis of the vertebral arteries. There is mild luminal beading of the bilateral V3  segments, without evidence of superimposed dissection.  Skeleton: No contributory findings.  CTA HEAD  Anterior circulation: Anterior communicating artery is present. Balanced carotid arteries. Posterior communicating arteries are not clearly visualized. The bilateral petrous and cavernous carotids are diffusely narrowed, without abrupt caliber change or cervical carotid involvement suggestive of dissection. This is likely atherosclerotic as there is patchy mural calcification. Stenosis is up to moderate at the petrouscavernous junction on the left.  2 mm inferior outpouching from the right supraclinoid carotid artery without visible associated vessel.  Posterior circulation: Markedly the tortuous V4 segments. No major vessel occlusion, notable stenosis, or aneurysm.  Venous sinuses: Patent  Anatomic variants: None of significance  Delayed phase: No parenchymal enhancement  IMPRESSION: 1. No acute arterial findings. 2. No evidence of infarct progression since yesterday. No hemorrhagic conversion. 3. Diffuse bilateral petrous and cavernous carotid stenosis, likely atherosclerotic. Narrowing is up to moderate at the left petrous cavernous junction. 4. Mild beading of the bilateral V3 segments, question fibromuscular dysplasia. 5. 2 mm outpouching from the right supraclinoid carotid, aneurysm or infundibulum with parent vessel below the resolution of CTA. 6. No arterial stenosis in the neck.   Electronically Signed   By: Monte Fantasia M.D.   On: 10/07/2014 13:01   Ct Head Wo Contrast  10/06/2014   CLINICAL DATA:  Leg pain, left-sided weakness  EXAM: CT HEAD WITHOUT CONTRAST  TECHNIQUE: Contiguous axial images were obtained from the base of the skull through the vertex without intravenous contrast.  COMPARISON:  12/26/2008  FINDINGS: No skull fracture is noted. Paranasal sinuses and mastoid air cells are unremarkable. No intracranial hemorrhage, mass effect or midline shift. No acute cortical infarction. No mass  lesion is noted on this unenhanced scan. Ventricular size is stable from prior exam. No intra or extra-axial fluid collection. The gray and white-matter differentiation is preserved.  IMPRESSION: No acute intracranial abnormality. No definite acute cortical infarction.   Electronically Signed   By: Lahoma Crocker M.D.   On: 10/06/2014 13:18   Ct Angio Neck W/cm &/or Wo/cm  10/07/2014   CLINICAL DATA:  Stroke with left leg pain.  EXAM: CT ANGIOGRAPHY HEAD AND NECK  TECHNIQUE: Multidetector CT imaging of the head and neck was performed using the standard protocol during bolus administration of intravenous contrast. Multiplanar CT image reconstructions and MIPs were obtained to evaluate the vascular anatomy. Carotid stenosis measurements (when applicable) are obtained utilizing NASCET criteria, using the distal internal carotid diameter as the denominator.  CONTRAST:  19mL OMNIPAQUE IOHEXOL 350 MG/ML SOLN  COMPARISON:  10/06/2014  FINDINGS: CT HEAD  Skull and Sinuses:Negative for fracture or destructive process. The mastoids, middle ears, and imaged paranasal sinuses are clear.  Orbits: No acute abnormality.  Brain: No evidence of acute infarction, hemorrhage, hydrocephalus, or mass lesion/mass effect. A  remote lacunar infarct is again noted in the left centrum semiovale. Known acute infarcts in the right hemisphere are not visualized. No hemorrhagic transformation. No hydrocephalus or shift.  CTA NECK  Aortic arch: Limited imaging of the arch. No evidence of dissection or notable plaque. There is a three-vessel origin without origin stenosis.  Right carotid system: There is no evidence of atherosclerosis, cervical carotid dissection, or stenosis. Tortuous ICA at the skullbase.  Left carotid system: There is no evidence of atherosclerosis, cervical carotid dissection, or stenosis. Tortuous ICA at the skullbase.  Vertebral arteries:Patent bilateral proximal subclavian arteries. No focal stenosis of the vertebral arteries.  There is mild luminal beading of the bilateral V3 segments, without evidence of superimposed dissection.  Skeleton: No contributory findings.  CTA HEAD  Anterior circulation: Anterior communicating artery is present. Balanced carotid arteries. Posterior communicating arteries are not clearly visualized. The bilateral petrous and cavernous carotids are diffusely narrowed, without abrupt caliber change or cervical carotid involvement suggestive of dissection. This is likely atherosclerotic as there is patchy mural calcification. Stenosis is up to moderate at the petrouscavernous junction on the left.  2 mm inferior outpouching from the right supraclinoid carotid artery without visible associated vessel.  Posterior circulation: Markedly the tortuous V4 segments. No major vessel occlusion, notable stenosis, or aneurysm.  Venous sinuses: Patent  Anatomic variants: None of significance  Delayed phase: No parenchymal enhancement  IMPRESSION: 1. No acute arterial findings. 2. No evidence of infarct progression since yesterday. No hemorrhagic conversion. 3. Diffuse bilateral petrous and cavernous carotid stenosis, likely atherosclerotic. Narrowing is up to moderate at the left petrous cavernous junction. 4. Mild beading of the bilateral V3 segments, question fibromuscular dysplasia. 5. 2 mm outpouching from the right supraclinoid carotid, aneurysm or infundibulum with parent vessel below the resolution of CTA. 6. No arterial stenosis in the neck.   Electronically Signed   By: Monte Fantasia M.D.   On: 10/07/2014 13:01   Mr Brain Wo Contrast  10/06/2014   CLINICAL DATA:  44 year old female with left lower extremity pain and weakness. Initial encounter.  EXAM: MRI HEAD WITHOUT CONTRAST  TECHNIQUE: Multiplanar, multiecho pulse sequences of the brain and surrounding structures were obtained without intravenous contrast.  COMPARISON:  Head CT without contrast 1315 hours today.  FINDINGS: There is a 12 mm area of restricted  diffusion in the lateral right thalamus near the posterior limb of the right internal capsule. Associated T2 and FLAIR hyperintensity, but no hemorrhage or mass effect.  Also in the right hemisphere at the corona radiata just above the low right lateral ventricle there is a small 6-8 mm focus of restricted diffusion. See series 5, image 18. This level also demonstrates mild T2 and FLAIR hyperintensity with no hemorrhage or mass effect.  In the left corona radiata there is signal abnormality most resembling a chronic lacunar infarct (series 9, image 18). Several other scattered bilateral cerebral white matter T2 and FLAIR hyperintense foci are nonspecific. In the left globus pallidus and anterior limb of the external capsule there are signal changes suggesting chronic lacunar infarcts. There is hemosiderin associated with the former.  The brainstem and cerebellum are within normal limits. Major intracranial vascular flow voids are within normal limits.  No midline shift, mass effect, evidence of mass lesion, ventriculomegaly, extra-axial collection or acute intracranial hemorrhage. Cervicomedullary junction and pituitary are within normal limits. Negative visualized cervical spine. Visible internal auditory structures appear normal. Trace left mastoid fluid. Negative nasopharynx. Trace paranasal sinus mucosal thickening. Visualized orbit  soft tissues are within normal limits. Visualized scalp soft tissues are within normal limits. Visualized bone marrow signal is within normal limits.  IMPRESSION: 1. Small acute lacunar infarcts in the lateral right thalamus/posterior limb right external capsule (right PCA territory) and also the right corona radiata (right MCA territory). No mass effect or hemorrhage. 2. Superimposed chronic lacunar infarcts in the left corona radiata and left basal ganglia (left MCA territory). 3. In light of the above in the patient's age, consider a source of cardiac or paradoxical emboli, such  as PFO. And otherwise consider risk factors for accelerated small vessel disease.   Electronically Signed   By: Genevie Ann M.D.   On: 10/06/2014 20:25      ASSESSMENT: AI, HTN, CVA  PLAN:  Aortic insufficiency noted by Dr. Adalberto Ill.  No sign of CHF.  HTN: BP has been improving. COntinue current meds.  ACE-I, CCB. Titrate as needed.  Longterm, this will be important for her to prevent recurrent events.  CVA: per rehab service. Patient on aspirin and plavix.  Jettie Booze, MD  10/13/2014  2:12 PM

## 2014-10-13 NOTE — Progress Notes (Signed)
Physical Therapy Session Note  Patient Details  Name: Brandy Roberts MRN: [id number] Date of Birth: 1970/12/12  Today's Date: 10/13/2014 PT Individual Time: 9798-9211 PT Individual Time Calculation (min): 60 min   Short Term Goals: Week 1:  PT Short Term Goal 1 (Week 1): Pt will perform dynamic standing tasks with single UE support at min A level PT Short Term Goal 2 (Week 1): Pt will perform functional transfers at min A level PT Short Term Goal 3 (Week 1): Pt will ambulate x 100' w/ LRAD at min A level PT Short Term Goal 4 (Week 1): Pt will negotiate up/down 3 STE with R rail at min A level and min cues for safety and technique.   Skilled Therapeutic Interventions/Progress Updates:   Pt received sitting in recliner, agreeable to therapy session.  Skilled session focused on gait with RW for improved quality and distance as well as with use of LiteGait treadmill support system to address upright posture, increased WB and weight shift to LLE, heel to toe contact with LLE, and increased L DF during swing phase of gait.  Performed total of 5 mins for 200' with speed of 0.3-0.4 mph with facilitation at L LE for increased L step length, increased L glute activation and forward advancement over LLE, as well as increased L step width.  Pt tolerated well with intermittent rest breaks and also while on treadmill, worked on decreasing amount of UE support from BUE>single UE>no UE, requiring increased assist/facilitation from therapist when without UE support.  Ambulated to therapy gym with RW at min/guard to close S level in order to work on [employer] with gait without AD.  Pt with increased coughing and feeling as if "something were stuck in throat" and felt as if she needed to vomit.  RN made aware and stated that he would inform PA.  Pt was able to ambulate x 90' without AD at min A level with facilitation as above on treadmill, however once back in seated position, felt same burning and scratching in  throat.  Assisted pt back to room via w/c and left in recliner with all needs in reach.    Therapy Documentation Precautions:  Precautions Precautions: Fall Precaution Comments: difficulty with full WB left leg in standing Restrictions Weight Bearing Restrictions: No   Vital Signs: Therapy Vitals Pulse Rate: 61 BP: (!) 152/66 mmHg Pain: Pain Assessment Pain Assessment: 0-10 Pain Score: 0-No pain   Locomotion : Ambulation Ambulation/Gait Assistance: 4: Min guard;5: Supervision   See FIM for current functional status  Therapy/Group: Individual Therapy  Denice Bors 10/13/2014, 10:47 AM

## 2014-10-13 NOTE — Consult Note (Signed)
NEUROCOGNITIVE [place]   [name] is a 44 year old woman, who was seen for a brief neurocognitive status examination to evaluate her emotional state and mental status in the setting of stroke.  According to her medical history, she was admitted on 10/06/14 with decreased balance and left-sided weakness.  MRI of the brain demonstrated an acute lacunar infarct in the lateral right thalamus posterior limb right external capsule and also the right corona radiata.  Superimposed chronic lacunar infarcts were seen in the left corona radiate and left basal ganglia.  She did not receive TPA.    Emotional Functioning:  During the clinical interview, [name] acknowledged some initial low mood for the first few days after her stroke, lamenting her loss of independence.  However, she stated that after she allowed herself to release that emotion, she began to feel increased motivation for recovery.  She commented that she is mostly motivated by a desire to return to her prior level of productivity and to wanting to get to a state in which she can see and hold her new granddaughter (born the day before the current examination).  [name] also stated that it has been helpful for her to see herself making physical progress.  She said that now when she becomes emotionally overwhelmed, she talks to others to vent (e.g. nursing staff) and has found that to be helpful.  In addition to concerns over loss of independence, [name] said that she has worry over finances, but it has been helpful to problem-solve with her family and with her Education officer, museum.  [name] responses to a self-report measure of symptoms of depression were not indicative of the presence of clinically significant depressed mood at this time.    Mental Status:  [name] total score on a very brief measure of mental status was suggestive of significant  cognitive disruption, possibly at the level of dementia (MMSE-2 brief = 12/16).  She misstated the date and was able to freely recall only 2 of 3 previously studied words after a brief delay.  Subjectively, she reported memory changes (e.g. conversational amnesia, forgetting activities she recently completed, etc.), inattention, trouble multi-tasking and slurred speech.    Impressions and Recommendations:  [name]'s score on a very brief mental status measure was suggestive of significant cognitive disruption, likely resulting from her stroke.  Further neurocognitive evaluation prior to discharge is recommended to better determine the nature and extent of cognitive sequelae of her stroke, particularly given her subjective cognitive complaints.  She will be scheduled for a brief neurocognitive evaluation next week for this purpose.  In addition, she may require a comprehensive neuropsychological evaluation post-discharge.  From an emotional standpoint, [name] seems to be experiencing an appropriate adjustment reaction to her medical condition.  No changes in her treatment plan are likely required to address mood.  Nursing staff and other staff members are encouraged to continue allowing her to vent when needed, as she has found that helpful.    DIAGNOSIS:   Embolic stroke  Marlane Hatcher, Psy.D.  Clinical Neuropsychologist

## 2014-10-13 NOTE — Progress Notes (Signed)
Occupational Therapy Session Note  Patient Details  Name: Brandy Roberts MRN: [id number] Date of Birth: 01/03/1971  Today's Date: 10/13/2014 OT Individual Time: 0900-1000 OT Individual Time Calculation (min): 60 min    Short Term Goals: Week 1:  OT Short Term Goal 1 (Week 1): Pt will perform bathing with Min A in order to increase i in self care tasks.  OT Short Term Goal 2 (Week 1): Pt will perform toilet transfer with Min A in order to increase I in functional transfers. OT Short Term Goal 3 (Week 1): Pt will perform LB dressing with steady assist and modifications as needed in order to increase I in self care task. OT Short Term Goal 4 (Week 1): Pt will engage in 10 minutes of dynamic standing task before needing rest break in order to increase I for occupational performance.  Skilled Therapeutic Interventions/Progress Updates:  Upon entering the room, pt seated in wheelchair with no c/o pain. Pt reporting , "I am tired. They are waking me up for vitals throughout the night since they are adjusting my meds." Pt required coaxing for participation this session and declined bathing. Pt engaging in dressing tasks with STS from wheelchair with RW and steady assist for clothing management. OT assisted pt with donning of L knee brace. Pt ambulating ~ 300+ feet without rest break towards and back from day room with steady assist. Pt initially having minor LOB to the R when looking L at therapist. When patient ambulated therapist had pt searching side to side for items being called out by therapist. No more LOB occurring during session. Pt required verbal cues to look ahead when walking as well as cues to increase gait speed for more nature gait. Pt seated in wheelchair recliner chair with call bell and all needed items within reach upon exiting the room.   Therapy Documentation Precautions:  Precautions Precautions: Fall Precaution Comments: difficulty with full WB left leg in  standing Restrictions Weight Bearing Restrictions: No Pain: Pain Assessment Pain Assessment: 0-10 Pain Score: 0-No pain  See FIM for current functional status  Therapy/Group: Individual Therapy  Phineas Semen 10/13/2014, 10:45 AM

## 2014-10-13 NOTE — Progress Notes (Signed)
Speech Language Pathology Daily Session Note  Patient Details  Name: Brandy Roberts MRN: [id number] Date of Birth: Sep 02, 1970  Today's Date: 10/13/2014 SLP Individual Time: 0830-0900 SLP Individual Time Calculation (min): 30 min  Short Term Goals: Week 1: SLP Short Term Goal 1 (Week 1): Pt will recognize and correct errors in the moment with min assist verbal cues when completing semi-complex home management/self care tasks over three consecutive sessions  SLP Short Term Goal 2 (Week 1): Pt will utilize dysarthria strategies to achieve fluency in conversations with min assist verbal and visual cues.   SLP Short Term Goal 3 (Week 1): Pt will utllize external aids to facilitate recall of new information with supervision cues.  Skilled Therapeutic Interventions:  Pt was seen for skilled ST targeting cognitive goals.  Upon arrival, pt was standing up from wheelchair unassisted attempting to get clothes out of the closet in preparation for OT session.  SLP reviewed and reinforced safety precautions including use of call bell to ask for assistance and not getting up out of the wheelchair unassisted.  Pt aware that if she continues to get up from her wheelchair unassisted, she will need a quick release belt for safety.  SLP facilitated the session with continued practice for medication management.  Pt completed pill box task from yesterday's therapy session with overall supervision for planning, organization, and error awareness.  SLP recommended that pt have at least initial assist for medication and financial management at discharge.  Pt verbalized understanding of recommendation and was in agreement.  Continue per current plan of care.     FIM:  Comprehension Comprehension Mode: Auditory Comprehension: 5-Follows basic conversation/direction: With no assist Expression Expression Mode: Verbal Expression: 5-Expresses basic needs/ideas: With no assist Social Interaction Social Interaction:  7-Interacts appropriately with others - No medications needed. Problem Solving Problem Solving: 5-Solves basic 90% of the time/requires cueing < 10% of the time Memory Memory: 4-Recognizes or recalls 75 - 89% of the time/requires cueing 10 - 24% of the time  Pain Pain Assessment Pain Assessment: No/denies pain Pain Score: 0-No pain  Therapy/Group: Individual Therapy  Kemberly Taves, Selinda Orion 10/13/2014, 12:40 PM

## 2014-10-13 NOTE — Progress Notes (Signed)
Occupational Therapy Session Note  Patient Details  Name: Brandy Roberts MRN: [id number] Date of Birth: 1970/08/27  Today's Date: 10/13/2014 OT Individual Time: 1500-1600 OT Individual Time Calculation (min): 60 min    Short Term Goals: Week 1:  OT Short Term Goal 1 (Week 1): Pt will perform bathing with Min A in order to increase i in self care tasks.  OT Short Term Goal 2 (Week 1): Pt will perform toilet transfer with Min A in order to increase I in functional transfers. OT Short Term Goal 3 (Week 1): Pt will perform LB dressing with steady assist and modifications as needed in order to increase I in self care task. OT Short Term Goal 4 (Week 1): Pt will engage in 10 minutes of dynamic standing task before needing rest break in order to increase I for occupational performance.  Skilled Therapeutic Interventions/Progress Updates:  Upon entering the room, pt seated in wheelchair with partner and family members present in the room. Pt requesting to shower this session. Pt also wishing to have partner checked off to assist with toilet transfers at night time. OT educated and observed patients partner assist pt in ambulating with RW and providing min A for safety to bathroom for transfer as well as toileting task. OT filled out safety plan in room to accurately refect this information. Pt undressed while seated on toilet and ambulated to sit on shower chair in walk in shower with min A for safety and use of RW. Pt engaged in bathing at shower level with steady assist when attempting to wash lower feet and legs. Pt dressed while seated on edge of TTB with steady assist during LB clothing management. Pt collecting all dirty clothing items and placing into bag and ambulated to patient laundry room ~ 200' with RW and min guard with min verbal cues for gait speed and weight shifting. Pt placing all clothing items into washing machine with SBA while standing inside of RW. Pt then returning to room in  same manner as above. Pt seated in recliner chair with partner remaining present in room. Call bell and all needed items within reach.   Therapy Documentation Precautions:  Precautions Precautions: Fall Precaution Comments: difficulty with full WB left leg in standing Restrictions Weight Bearing Restrictions: No  See FIM for current functional status  Therapy/Group: Individual Therapy  Phineas Semen 10/13/2014, 5:26 PM

## 2014-10-13 NOTE — Progress Notes (Signed)
44 y.o. right handed female with history of hypertension and tobacco abuse. 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 Admitted 10/06/2014 with decreased balance and left-sided weakness. Blood pressure 196/88.UDS positive THC. MRI of the brain showed acute lacunar infarct lateral right thalamus posterior limb right external capsule and also the right corona radiata. Superimposed chronic lacunar infarcts in the left corona radiata and left basal ganglia. Echocardiogram with ejection fraction of 97% grade 1 diastolic dysfunction. CT angiogram head and neck with no acute arterial findings. Patient did not receive TPA.Venous doppler negative.  Subjective/Complaints: No issues overnite "want to go home" Discussed med adjustments may take a couple days to take full effect Objective: Vital Signs: Blood pressure 150/63, pulse 58, temperature 98.3 F (36.8 C), temperature source Oral, resp. rate 6, height '5\' 6"'  (1.676 m), weight 82.691 kg (182 lb 4.8 oz), last menstrual period 10/03/2014, SpO2 100 %. No results found. Results for orders placed or performed during the hospital encounter of 10/10/14 (from the past 72 hour(s))  CBC     Status: None   Collection Time: 10/10/14  5:38 PM  Result Value Ref Range   WBC 5.3 4.0 - 10.5 K/uL   RBC 4.82 3.87 - 5.11 MIL/uL   Hemoglobin 13.5 12.0 - 15.0 g/dL   HCT 41.9 36.0 - 46.0 %   MCV 86.9 78.0 - 100.0 fL   MCH 28.0 26.0 - 34.0 pg   MCHC 32.2 30.0 - 36.0 g/dL   RDW 14.6 11.5 - 15.5 %   Platelets 162 150 - 400 K/uL  Creatinine, serum     Status: None   Collection Time: 10/10/14  5:38 PM  Result Value Ref Range   Creatinine, Ser 0.93 0.44 - 1.00 mg/dL   GFR calc non Af Amer >60 >60 mL/min   GFR calc Af Amer >60 >60 mL/min    Comment: (NOTE) The eGFR has been calculated using the CKD EPI equation. This calculation has not been validated in all clinical situations. eGFR's  persistently <60 mL/min signify possible Chronic Kidney Disease.   CBC WITH DIFFERENTIAL     Status: Abnormal   Collection Time: 10/11/14  5:30 AM  Result Value Ref Range   WBC 4.7 4.0 - 10.5 K/uL   RBC 4.64 3.87 - 5.11 MIL/uL   Hemoglobin 13.0 12.0 - 15.0 g/dL   HCT 40.2 36.0 - 46.0 %   MCV 86.6 78.0 - 100.0 fL   MCH 28.0 26.0 - 34.0 pg   MCHC 32.3 30.0 - 36.0 g/dL   RDW 14.6 11.5 - 15.5 %   Platelets 146 (L) 150 - 400 K/uL   Neutrophils Relative % 44 43 - 77 %   Neutro Abs 2.1 1.7 - 7.7 K/uL   Lymphocytes Relative 43 12 - 46 %   Lymphs Abs 2.0 0.7 - 4.0 K/uL   Monocytes Relative 10 3 - 12 %   Monocytes Absolute 0.5 0.1 - 1.0 K/uL   Eosinophils Relative 3 0 - 5 %   Eosinophils Absolute 0.1 0.0 - 0.7 K/uL   Basophils Relative 0 0 - 1 %   Basophils Absolute 0.0 0.0 - 0.1 K/uL  Comprehensive metabolic panel     Status: Abnormal   Collection Time: 10/11/14  5:30 AM  Result Value Ref Range   Sodium 140 135 - 145 mmol/L   Potassium 3.6 3.5 - 5.1 mmol/L   Chloride 109 101 - 111 mmol/L  CO2 22 22 - 32 mmol/L   Glucose, Bld 96 70 - 99 mg/dL   BUN 17 6 - 20 mg/dL   Creatinine, Ser 0.82 0.44 - 1.00 mg/dL   Calcium 9.0 8.9 - 10.3 mg/dL   Total Protein 6.9 6.5 - 8.1 g/dL   Albumin 3.3 (L) 3.5 - 5.0 g/dL   AST 18 15 - 41 U/L   ALT 16 14 - 54 U/L   Alkaline Phosphatase 57 38 - 126 U/L   Total Bilirubin 0.7 0.3 - 1.2 mg/dL   GFR calc non Af Amer >60 >60 mL/min   GFR calc Af Amer >60 >60 mL/min    Comment: (NOTE) The eGFR has been calculated using the CKD EPI equation. This calculation has not been validated in all clinical situations. eGFR's persistently <60 mL/min signify possible Chronic Kidney Disease.    Anion gap 9 5 - 15     HEENT: normal Cardio: RRR and no murmur Resp: CTA B/L and unlabored GI: BS positive and NT, ND Extremity:  Pulses positive and No Edema Skin:   Intact Neuro: Alert/Oriented, Flat, Cranial Nerve II-XII normal, Abnormal Sensory reduced sensation  in left hand and left foot, Abnormal Motor 4/5 in Left delt, bi, tri, grip, HF, KE ADF and Abnormal FMC Ataxic/ dec FMC Musc/Skel:  Normal Gen NAD   Assessment/Plan: 1. Functional deficits secondary to right thalamic/PLIC infarct which require 3+ hours per day of interdisciplinary therapy in a comprehensive inpatient rehab setting. Physiatrist is providing close team supervision and 24 hour management of active medical problems listed below. Physiatrist and rehab team continue to assess barriers to discharge/monitor patient progress toward functional and medical goals. FIM: FIM - Bathing Bathing Steps Patient Completed: Chest, Right Arm, Left Arm, Abdomen, Right upper leg, Left upper leg, Front perineal area Bathing: 3: Mod-Patient completes 5-7 22f10 parts or 50-74%  FIM - Upper Body Dressing/Undressing Upper body dressing/undressing steps patient completed: Thread/unthread right bra strap, Thread/unthread left bra strap, Hook/unhook bra, Thread/unthread right sleeve of pullover shirt/dresss, Thread/unthread left sleeve of pullover shirt/dress, Put head through opening of pull over shirt/dress, Pull shirt over trunk Upper body dressing/undressing: 5: Set-up assist to: Obtain clothing/put away FIM - Lower Body Dressing/Undressing Lower body dressing/undressing steps patient completed: Thread/unthread right underwear leg, Thread/unthread left underwear leg, Pull underwear up/down, Thread/unthread right pants leg, Thread/unthread left pants leg, Pull pants up/down, Don/Doff left shoe, Fasten/unfasten right shoe, Fasten/unfasten left shoe, Don/Doff right shoe Lower body dressing/undressing: 4: Min-Patient completed 75 plus % of tasks        FIM - BControl and instrumentation engineerDevices: Arm rests Bed/Chair Transfer: 4: Chair or W/C > Bed: Min A (steadying Pt. > 75%), 4: Sit > Supine: Min A (steadying pt. > 75%/lift 1 leg)  FIM - Locomotion: Wheelchair Distance:  100 Locomotion: Wheelchair: 5: Travels 150 ft or more: maneuvers on rugs and over door sills with supervision, cueing or coaxing FIM - Locomotion: Ambulation Locomotion: Ambulation Assistive Devices: WAdministratorAmbulation/Gait Assistance: 4: Min guard, 5: Supervision Locomotion: Ambulation: 2: Travels 50 - 149 ft with minimal assistance (Pt.>75%)  Comprehension Comprehension Mode: Auditory Comprehension: 5-Follows basic conversation/direction: With no assist  Expression Expression Mode: Verbal Expression: 5-Expresses basic needs/ideas: With no assist  Social Interaction Social Interaction: 7-Interacts appropriately with others - No medications needed.  Problem Solving Problem Solving: 4-Solves basic 75 - 89% of the time/requires cueing 10 - 24% of the time  Memory Memory: 4-Recognizes or recalls 75 - 89% of  the time/requires cueing 10 - 24% of the time  Medical Problem List and Plan: 1. Functional deficits secondary to right thalamic/PLIC infarct 2.  DVT Prophylaxis/Anticoagulation: Subcutaneous heparin. Monitor platelet counts and any signs of bleeding.Vascular study negative 3. Pain Management: Tylenol as needed 4. Hypertension. Lisinopril 20 mg twice a day, Lopressor 50 mg twice a day, hydralazine 25 mg 3 times a day, Aldactone 25 mg daily.Amlodipine and HCTZ added by cardiology on 5/12 Monitor with increased mobility, Renal artery ultrasound 5. Neuropsych: This patient is capable of making decisions on her own behalf. 6. Skin/Wound Care: Routine skin checks 7. Fluids/Electrolytes/Nutrition: follow I&O's with follow-up chemistries 8. Tobacco/Marijuana abuse. Counseling 9. Hyperlipidemia. Lipitor   LOS (Days) 3 A FACE TO FACE EVALUATION WAS PERFORMED  KIRSTEINS,ANDREW E 10/13/2014, 7:10 AM

## 2014-10-13 NOTE — IPOC Note (Signed)
Overall Plan of Care [facility]) Patient Details Name: Brandy Roberts MRN: [id number] DOB: [birth year]  Admitting Diagnosis: R CVA  Hospital Problems: Active Problems:   Embolic stroke involving right posterior cerebral artery   Essential hypertension   Acute ischemic VBA thalamic stroke     Functional Problem List: Nursing Behavior, Endurance, Medication Management, Safety, Skin Integrity, Sensory, Nutrition  PT Balance, Endurance, Motor, Safety, Sensory  OT Balance, Sensory, Cognition, Endurance, Motor, Perception, Safety  SLP Cognition, Linguistic  TR         Basic ADL's: OT Grooming, Bathing, Dressing, Toileting     Advanced  ADL's: OT Simple Meal Preparation, Laundry     Transfers: PT Bed Mobility, Bed to Chair, Car, [employer], Futures trader, Metallurgist: PT Ambulation, Stairs     Additional Impairments: OT Fuctional Use of Upper Extremity  SLP Communication, Social Cognition expression Problem Solving, Memory, Attention, Awareness  TR      Anticipated Outcomes Item Anticipated Outcome  Self Feeding n/a  Swallowing      Basic self-care  overall Mod I  Toileting  overall Mod I   Bathroom Transfers overall Mod I   Bowel/Bladder  MOD I  Transfers  mod I  Locomotion  mod I   Communication  Mod I   Cognition  supervision   Pain  less than 2  Safety/Judgment  Pt will remain safe while in the hospital Rehab unit   Therapy Plan: PT Intensity: Minimum of 1-2 x/day ,45 to 90 minutes PT Frequency: 5 out of 7 days PT Duration Estimated Length of Stay: 12-14 days  OT Intensity: Minimum of 1-2 x/day, 45 to 90 minutes OT Frequency: 5 out of 7 days OT Duration/Estimated Length of Stay: 12-14 days SLP Intensity: Minumum of 1-2 x/day, 30 to 90 minutes SLP Frequency: 3 to 5 out of 7 days SLP Duration/Estimated Length of Stay: 10-14 days        Team Interventions: Nursing Interventions Patient/Family Education, Medication Management,  Skin Care/Wound Management, Dysphagia/Aspiration Precaution Training, Discharge Planning, Psychosocial Support, Disease Management/Prevention  PT interventions Ambulation/gait training, Training and development officer, Cognitive remediation/compensation, Community reintegration, Discharge planning, Disease management/prevention, DME/adaptive equipment instruction, Functional mobility training, Neuromuscular re-education, Pain management, Patient/family education, Psychosocial support, Splinting/orthotics, Stair training, Therapeutic Activities, Therapeutic Exercise, UE/LE Strength taining/ROM, UE/LE Coordination activities, Wheelchair propulsion/positioning  OT Interventions Training and development officer, Discharge planning, Self Care/advanced ADL retraining, Therapeutic Activities, UE/LE Coordination activities, Cognitive remediation/compensation, Functional mobility training, Patient/family education, Therapeutic Exercise, Community reintegration, Engineer, drilling, Neuromuscular re-education, Psychosocial support, UE/LE Strength taining/ROM  SLP Interventions Cognitive remediation/compensation, English as a second language teacher, Functional tasks, Patient/family education, Oral motor exercises, Internal/external aids, Environmental controls  TR Interventions    SW/CM Interventions Discharge Planning, Psychosocial Support, Patient/Family Education    Team Discharge Planning: Destination: PT-Home ,OT- Home , SLP-Home Projected Follow-up: PT-Outpatient PT, OT-  Home health OT, Outpatient OT, 24 hour supervision/assistance, SLP-24 hour supervision/assistance, Home Health SLP, Outpatient SLP Projected Equipment Needs: PT-To be determined, OT- Tub/shower seat, SLP-None recommended by SLP Equipment Details: PT- , OT-  Patient/family involved in discharge planning: PT- Patient,  OT-Patient, SLP-Patient  MD ELOS: 10-14d Medical Rehab Prognosis:  Good Assessment: 44 y.o. right handed female with history of  hypertension and tobacco abuse. 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 Admitted 10/06/2014 with decreased balance and left-sided weakness. Blood pressure 196/88.UDS positive THC. MRI of the brain showed acute lacunar infarct lateral right thalamus posterior limb  right external capsule and also the right corona radiata. Superimposed chronic lacunar infarcts in the left corona radiata and left basal ganglia. Echocardiogram with ejection fraction of 68% grade 1 diastolic dysfunction. CT angiogram head and neck with no acute arterial findings. Patient did not receive TPA.Venous doppler negative.   Now requiring 24/7 Rehab RN,MD, as well as CIR level PT, OT and SLP.  Treatment team will focus on ADLs and mobility with goals set at Mod I  See Team Conference Notes for weekly updates to the plan of care

## 2014-10-14 ENCOUNTER — Inpatient Hospital Stay (HOSPITAL_COMMUNITY): Payer: No Typology Code available for payment source | Admitting: Speech Pathology

## 2014-10-14 ENCOUNTER — Inpatient Hospital Stay (HOSPITAL_COMMUNITY): Payer: No Typology Code available for payment source | Admitting: *Deleted

## 2014-10-14 ENCOUNTER — Inpatient Hospital Stay (HOSPITAL_COMMUNITY): Payer: No Typology Code available for payment source | Admitting: Occupational Therapy

## 2014-10-14 NOTE — Progress Notes (Signed)
Physical Therapy Session Note  Patient Details  Name: Brandy Roberts MRN: [id number] Date of Birth: [birth year]  Today's Date: 10/14/2014 PT Individual Time: 1255-1355 PT Individual Time Calculation (min): 60 min     Skilled Therapeutic Interventions/Progress Updates:  Patient in room ,agrees to therapy session, no c/o of pain. Gait Training with and w/o RW.with AD 3 x 100 feet with CGA and cues for step width and forward gaze-patient with knee brace on -slight recurvatum noted on L side no LOB , w/o AD min A and occasional assistance in L foot lift 2 x60 feet. NuStep 1 x 10 min with 2 rest breaks in order to increase strength and coordination as well as to facilitate reciprocal movement.  Biodex-Limits of stability 2 x 2 .30 min with min to CGA, and manual assistance for weight shifting, 3 x manual locking of L Knee to increase weight shift. NMR to L LE to increase glut strength with resisted target hip abduction and extension.  In supine bridging with hip abd while maintaining bridge 2 x10.  Patient returned to room,left sitting in recliner, all needs within reach.  Therapy Documentation Precautions:  Precautions Precautions: Fall Precaution Comments: difficulty with full WB left leg in standing Restrictions Weight Bearing Restrictions: No  See FIM for current functional status  Therapy/Group: Individual Therapy  Guadlupe Spanish 10/14/2014, 1:52 PM

## 2014-10-14 NOTE — Progress Notes (Signed)
44 y.o. right handed female with history of hypertension and tobacco abuse. 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 Admitted 10/06/2014 with decreased balance and left-sided weakness. Blood pressure 196/88.UDS positive THC. MRI of the brain showed acute lacunar infarct lateral right thalamus posterior limb right external capsule and also the right corona radiata. Superimposed chronic lacunar infarcts in the left corona radiata and left basal ganglia. Echocardiogram with ejection fraction of 32% grade 1 diastolic dysfunction. CT angiogram head and neck with no acute arterial findings. Patient did not receive TPA.Venous doppler negative.  Subjective/Complaints: No issues currently. Reports some dizziness and "not feeling well" on Thursday but no problems yesterday.   Objective: Vital Signs: Blood pressure 146/58, pulse 55, temperature 98.3 F (36.8 C), temperature source Oral, resp. rate 18, height 5\' 6"  (1.676 m), weight 82.691 kg (182 lb 4.8 oz), last menstrual period 10/03/2014, SpO2 98 %. No results found. No results found for this or any previous visit (from the past 72 hour(s)).   HEENT: normal Cardio: RRR and no murmur Resp: CTA B/L and unlabored GI: BS positive and NT, ND Extremity:  Pulses positive and No Edema Skin:   Intact Neuro: Alert/Oriented, Flat, Cranial Nerve II-XII normal, Abnormal Sensory reduced sensation in left hand and left foot, Abnormal Motor 4/5 in Left delt, bi, tri, grip, HF, KE ADF and Abnormal FMC Ataxic/ dec FMC Musc/Skel:  Normal Gen NAD   Assessment/Plan: 1. Functional deficits secondary to right thalamic/PLIC infarct which require 3+ hours per day of interdisciplinary therapy in a comprehensive inpatient rehab setting. Physiatrist is providing close team supervision and 24 hour management of active medical problems listed below. Physiatrist and rehab team continue to assess barriers  to discharge/monitor patient progress toward functional and medical goals.  Episode on Thursday likely BP related. Discussed with patient  FIM: FIM - Bathing Bathing Steps Patient Completed: Chest, Right Arm, Left Arm, Abdomen, Right upper leg, Left upper leg, Front perineal area, Right lower leg (including foot) Bathing: 4: Min-Patient completes 8-9 37f 10 parts or 75+ percent  FIM - Upper Body Dressing/Undressing Upper body dressing/undressing steps patient completed: Thread/unthread right sleeve of pullover shirt/dresss, Thread/unthread left sleeve of pullover shirt/dress, Put head through opening of pull over shirt/dress, Pull shirt over trunk Upper body dressing/undressing: 5: Set-up assist to: Obtain clothing/put away FIM - Lower Body Dressing/Undressing Lower body dressing/undressing steps patient completed: Thread/unthread right underwear leg, Thread/unthread left underwear leg, Pull underwear up/down, Thread/unthread right pants leg, Thread/unthread left pants leg, Pull pants up/down, Don/Doff right sock, Don/Doff left sock Lower body dressing/undressing: 4: Steadying Assist  FIM - Toileting Toileting steps completed by patient: Adjust clothing prior to toileting, Performs perineal hygiene, Adjust clothing after toileting Toileting: 4: Steadying assist  FIM - Radio producer Devices: Environmental consultant, Product manager Transfers: 4-To toilet/BSC: Min A (steadying Pt. > 75%), 4-From toilet/BSC: Min A (steadying Pt. > 75%)  FIM - Bed/Chair Transfer Bed/Chair Transfer Assistive Devices: Arm rests Bed/Chair Transfer: 4: Bed > Chair or W/C: Min A (steadying Pt. > 75%), 4: Chair or W/C > Bed: Min A (steadying Pt. > 75%)  FIM - Locomotion: Wheelchair Distance: 100 Locomotion: Wheelchair: 0: Activity did not occur FIM - Locomotion: Ambulation Locomotion: Ambulation Assistive Devices: Administrator Ambulation/Gait Assistance: 4: Min guard, 5:  Supervision Locomotion: Ambulation: 4: Travels 150 ft or more with minimal assistance (Pt.>75%)  Comprehension Comprehension Mode: Auditory Comprehension: 5-Follows basic conversation/direction: With  no assist  Expression Expression Mode: Verbal Expression: 5-Expresses basic needs/ideas: With no assist  Social Interaction Social Interaction: 7-Interacts appropriately with others - No medications needed.  Problem Solving Problem Solving: 5-Solves basic 90% of the time/requires cueing < 10% of the time  Memory Memory: 4-Recognizes or recalls 75 - 89% of the time/requires cueing 10 - 24% of the time  Medical Problem List and Plan: 1. Functional deficits secondary to right thalamic/PLIC infarct 2.  DVT Prophylaxis/Anticoagulation: Subcutaneous heparin. Monitor platelet counts and any signs of bleeding.Vascular study negative 3. Pain Management: Tylenol as needed 4. Hypertension. Lisinopril 20 mg twice a day, Lopressor 50 mg twice a day, hydralazine 25 mg 3 times a day, Aldactone 25 mg daily.Amlodipine and HCTZ added by cardiology on 5/12 Monitor with increased mobility, Renal artery ultrasound  -bp's improved and more consistent over last 24 hours 5. Neuropsych: This patient is capable of making decisions on her own behalf. 6. Skin/Wound Care: Routine skin checks 7. Fluids/Electrolytes/Nutrition: follow I&O's with follow-up chemistries 8. Tobacco/Marijuana abuse. Counseling 9. Hyperlipidemia. Lipitor   LOS (Days) 4 A FACE TO FACE EVALUATION WAS PERFORMED  SWARTZ,ZACHARY T 10/14/2014, 7:18 AM

## 2014-10-14 NOTE — Progress Notes (Signed)
Occupational Therapy Session Note  Patient Details  Name: Brandy Roberts MRN: [id number] Date of Birth: 19-Nov-1970  Today's Date: 10/14/2014 OT Individual Time: 0814-4818 and 5631-4970 OT Individual Time Calculation (min): 60 min and 30 min    Short Term Goals: Week 1:  OT Short Term Goal 1 (Week 1): Pt will perform bathing with Min A in order to increase i in self care tasks.  OT Short Term Goal 2 (Week 1): Pt will perform toilet transfer with Min A in order to increase I in functional transfers. OT Short Term Goal 3 (Week 1): Pt will perform LB dressing with steady assist and modifications as needed in order to increase I in self care task. OT Short Term Goal 4 (Week 1): Pt will engage in 10 minutes of dynamic standing task before needing rest break in order to increase I for occupational performance.  Skilled Therapeutic Interventions/Progress Updates:  Session 1: Upon entering the room, pt supine in bed with no c/o pain. Pt reporting she slept well last night. STS from bed with supervision and RW. Pt obtaining all needed items for shower within room with SBA - min guard with RW. Pt ambulating to shower room for transfer onto tub shower combination with steady assist onto TTB. Pt engaging in bathing at shower level with supervision and min verbal cues for lateral leans to wash buttocks instead of standing. Therapist also recommending safety treads for shower at home to decrease fall risk. Pt dressing from TTB with STS and steady assist. Pt returning to room with SBA - min guard with use of RW and seated for breakfast. Partner present in room and call bell and all needed items within reach.  Session 2: Upon entering the room, pt supine in bed sleeping but easily awoken. Pt ambulated into bathroom with RW with SBA. Toileting as well as toilet transfer performed with min guard assist. Pt then ambulated ~ 68 ' to ADL apartment with SBA. Pt making bed in ADL apartment with out use of RW and Min  A for balance. Pt having 1 LOB but able to self correct. After completion of task, pt returning to room via RW in same manner. Pt removing shoes independently and laying supine in bed with no assistance. Call bell and all needed items within reach.  Therapy Documentation Precautions:  Precautions Precautions: Fall Precaution Comments: difficulty with full WB left leg in standing Restrictions Weight Bearing Restrictions: No   Pain: Pain Assessment Pain Score: 0-No pain Faces Pain Scale: No hurt PAINAD (Pain Assessment in Advanced Dementia) Breathing: normal  See FIM for current functional status  Therapy/Group: Individual Therapy  Phineas Semen 10/14/2014, 12:41 PM

## 2014-10-14 NOTE — Progress Notes (Signed)
Speech Language Pathology Daily Session Note  Patient Details  Name: Brandy Roberts MRN: [id number] Date of Birth: December 06, 1970  Today's Date: 10/14/2014 SLP Individual Time: 1000-1100 SLP Individual Time Calculation (min): 60 min  Short Term Goals: Week 1: SLP Short Term Goal 1 (Week 1): Pt will recognize and correct errors in the moment with min assist verbal cues when completing semi-complex home management/self care tasks over three consecutive sessions  SLP Short Term Goal 2 (Week 1): Pt will utilize dysarthria strategies to achieve fluency in conversations with min assist verbal and visual cues.   SLP Short Term Goal 3 (Week 1): Pt will utllize external aids to facilitate recall of new information with supervision cues.  Skilled Therapeutic Interventions: Skilled treatment session focused on cognitive goals.  Upon arrival, pt was supine in bed and transferred to recliner with supervision and required verbal cues for hand placement while sitting. Patient also attempt to stand and reach for something on bedside table without assistance and required verbal cues for safety due to reaching.  SLP reviewed and reinforced safety precautions including use of call bell to ask for assistance.  SLP facilitated session with continued practice for medication management.  Pt completed organizing a 4 timer per day pill box with overall Mod I for planning, problem solving and organization with task.  Patient left upright in recliner with all needs within reach.  Continue per current plan of care.       FIM:  Comprehension Comprehension Mode: Auditory Comprehension: 5-Follows basic conversation/direction: With no assist Expression Expression Mode: Verbal Expression: 5-Expresses basic needs/ideas: With no assist Social Interaction Social Interaction: 7-Interacts appropriately with others - No medications needed. Problem Solving Problem Solving: 5-Solves complex 90% of the time/cues < 10% of the  time Memory Memory: 5-Recognizes or recalls 90% of the time/requires cueing < 10% of the time FIM - Eating Eating Activity: 7: Complete independence:no helper  Pain Pain Assessment Pain Assessment: No/denies pain  Therapy/Group: Individual Therapy  Jaspreet Bodner 10/14/2014, 3:58 PM

## 2014-10-15 ENCOUNTER — Inpatient Hospital Stay (HOSPITAL_COMMUNITY): Payer: No Typology Code available for payment source | Admitting: Occupational Therapy

## 2014-10-15 ENCOUNTER — Inpatient Hospital Stay (HOSPITAL_COMMUNITY): Payer: No Typology Code available for payment source

## 2014-10-15 LAB — ALDOSTERONE + RENIN ACTIVITY W/ RATIO
ALDO / PRA Ratio: 30 (ref 0.0–30.0)
ALDOSTERONE: 6 ng/dL (ref 0.0–30.0)
PRA LC/MS/MS: 0.2 ng/mL/hr

## 2014-10-15 NOTE — Progress Notes (Signed)
Physical Therapy Session Note  Patient Details  Name: Brandy Roberts MRN: [id number] Date of Birth: Jan 06, 1971  Today's Date: 10/15/2014 PT Individual Time: 0900-1000 PT Individual Time Calculation (min): 60 min   Short Term Goals: Week 1:  PT Short Term Goal 1 (Week 1): Pt will perform dynamic standing tasks with single UE support at min A level PT Short Term Goal 2 (Week 1): Pt will perform functional transfers at min A level PT Short Term Goal 3 (Week 1): Pt will ambulate x 100' w/ LRAD at min A level PT Short Term Goal 4 (Week 1): Pt will negotiate up/down 3 STE with R rail at min A level and min cues for safety and technique.   Skilled Therapeutic Interventions/Progress Updates:    Session focused on LLE NMR, gait training, dynamic balance. Instructed pt in multiple bouts of ambulation w/ RW and hinged knee brace w/ overall SBA with intermittent cueing for L foot drag and intermittent need for MinGuard during initial bout of ambulation. Instructed pt in cone taps w/ RLE w/ 0 UE support to increase single leg stance balance on L with cueing to squeeze quad and glute before lifting R foot up. Instructed pt in supine ex's for increased glute activation double leg bridging progressing to single leg bridging w/ LLE 2x10 each with 10 second hold. Instructed pt in standing Kinetron w/ heavy resistance to increase hip/knee extension on L. Instructed pt in gait around obstacles in hallway w/ RW to simulate home environment around cones and over low hurdles w/ overall SBA. Pt tolerated session well. Session ended in pt's room, where pt was left seated in recliner w/ all needs within reach.   Therapy Documentation Precautions:  Precautions Precautions: Fall Precaution Comments: difficulty with full WB left leg in standing Restrictions Weight Bearing Restrictions: No Pain: Pain Assessment Pain Assessment: No/denies pain  See FIM for current functional status  Therapy/Group: Individual  Therapy  Rada Hay   Rada Hay, PT, DPT  10/15/2014, 7:38 AM

## 2014-10-15 NOTE — Progress Notes (Signed)
Occupational Therapy Session Note  Patient Details  Name: Brandy Roberts MRN: [id number] Date of Birth: [birth year]  Today's Date: 10/15/2014 OT Individual Time: 1453-1531 OT Individual Time Calculation (min): 38 min    Short Term Goals: Week 1:  OT Short Term Goal 1 (Week 1): Pt will perform bathing with Min A in order to increase i in self care tasks.  OT Short Term Goal 2 (Week 1): Pt will perform toilet transfer with Min A in order to increase I in functional transfers. OT Short Term Goal 3 (Week 1): Pt will perform LB dressing with steady assist and modifications as needed in order to increase I in self care task. OT Short Term Goal 4 (Week 1): Pt will engage in 10 minutes of dynamic standing task before needing rest break in order to increase I for occupational performance.  Skilled Therapeutic Interventions/Progress Updates:    Pt ambulated to the therapy gym with supervision and RW during session.  Noted pt with slight increased trunk flexion with ambulation as well as decreased hip extension.  Left knee brace in place however pt still with some hyperextension of the left knee.  In the therapy gym worked on functional sit to stand transitions with better control and alignment.  Focus on forward weight transitions while having LEs positioned underneath her for equal weightbearing for sit to stand.  Pt with decreased timing of hip flexion and knee flexion for controlled sitting resulting in LOB posterior at times.  Performed multiple sit to stand transitions without use of the UEs to emphasize the need for greater forward lumbar flexion.  Pt ambulated to the day room at end of session for next Stroke Risk Factors class.  Encouraged upright posture, hip extension, and not hyperextending the left knee.      Therapy Documentation Precautions:  Precautions Precautions: Fall Precaution Comments: difficulty with full WB left leg in standing Restrictions Weight Bearing Restrictions:  No  Vital Signs: Therapy Vitals Temp: 98.4 F (36.9 C) Temp Source: Oral Pulse Rate: 61 Resp: 18 BP: (!) 171/61 mmHg Patient Position (if appropriate): Sitting Oxygen Therapy SpO2: 99 % O2 Device: Not Delivered Pain: Pain Assessment Pain Assessment: No/denies pain ADL: See FIM for current functional status  Therapy/Group: Individual Therapy  Saudia Smyser OTR/L 10/15/2014, 4:19 PM

## 2014-10-15 NOTE — Progress Notes (Signed)
44 y.o. right handed female with history of hypertension and tobacco abuse. 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 Admitted 10/06/2014 with decreased balance and left-sided weakness. Blood pressure 196/88.UDS positive THC. MRI of the brain showed acute lacunar infarct lateral right thalamus posterior limb right external capsule and also the right corona radiata. Superimposed chronic lacunar infarcts in the left corona radiata and left basal ganglia. Echocardiogram with ejection fraction of 78% grade 1 diastolic dysfunction. CT angiogram head and neck with no acute arterial findings. Patient did not receive TPA.Venous doppler negative.  Subjective/Complaints: Feeling well. Wants to know if she can stop heparin injections---she is walking to day room with therapy currently.  Objective: Vital Signs: Blood pressure 168/60, pulse 57, temperature 98.2 F (36.8 C), temperature source Oral, resp. rate 18, height 5\' 6"  (1.676 m), weight 82.691 kg (182 lb 4.8 oz), last menstrual period 10/03/2014, SpO2 99 %. No results found. No results found for this or any previous visit (from the past 72 hour(s)).   HEENT: normal Cardio: RRR and no murmur Resp: CTA B/L and unlabored GI: BS positive and NT, ND Extremity:  Pulses positive and No Edema Skin:   Intact Neuro: Alert/Oriented, Flat, Cranial Nerve II-XII normal, Abnormal Sensory reduced sensation in left hand and left foot, Abnormal Motor 4/5 in Left delt, bi, tri, grip, HF, KE ADF and Abnormal FMC Ataxic/ dec FMC Musc/Skel:  Normal Gen NAD   Assessment/Plan: 1. Functional deficits secondary to right thalamic/PLIC infarct which require 3+ hours per day of interdisciplinary therapy in a comprehensive inpatient rehab setting. Physiatrist is providing close team supervision and 24 hour management of active medical problems listed below. Physiatrist and rehab team continue to assess  barriers to discharge/monitor patient progress toward functional and medical goals.    FIM: FIM - Bathing Bathing Steps Patient Completed: Chest, Right Arm, Left Arm, Abdomen, Right upper leg, Left upper leg, Front perineal area, Right lower leg (including foot), Buttocks Bathing: 4: Min-Patient completes 8-9 62f 10 parts or 75+ percent  FIM - Upper Body Dressing/Undressing Upper body dressing/undressing steps patient completed: Thread/unthread right sleeve of pullover shirt/dresss, Thread/unthread left sleeve of pullover shirt/dress, Put head through opening of pull over shirt/dress, Pull shirt over trunk Upper body dressing/undressing: 4: Steadying assist FIM - Lower Body Dressing/Undressing Lower body dressing/undressing steps patient completed: Thread/unthread right underwear leg, Thread/unthread left underwear leg, Pull underwear up/down, Thread/unthread right pants leg, Thread/unthread left pants leg, Pull pants up/down, Don/Doff right sock, Don/Doff left sock Lower body dressing/undressing: 4: Steadying Assist  FIM - Toileting Toileting steps completed by patient: Adjust clothing prior to toileting, Performs perineal hygiene, Adjust clothing after toileting Toileting: 4: Steadying assist  FIM - Radio producer Devices: Environmental consultant, Product manager Transfers: 4-To toilet/BSC: Min A (steadying Pt. > 75%), 4-From toilet/BSC: Min A (steadying Pt. > 75%)  FIM - Bed/Chair Transfer Bed/Chair Transfer Assistive Devices: Arm rests Bed/Chair Transfer: 4: Bed > Chair or W/C: Min A (steadying Pt. > 75%)  FIM - Locomotion: Wheelchair Distance: 100 Locomotion: Wheelchair: 0: Activity did not occur FIM - Locomotion: Ambulation Locomotion: Ambulation Assistive Devices: Administrator Ambulation/Gait Assistance: 4: Min guard, 5: Supervision Locomotion: Ambulation: 4: Travels 150 ft or more with minimal assistance (Pt.>75%)  Comprehension Comprehension Mode:  Auditory Comprehension: 5-Follows basic conversation/direction: With no assist  Expression Expression Mode: Verbal Expression: 5-Expresses basic needs/ideas: With no assist  Social Interaction Social Interaction: 7-Interacts appropriately  with others - No medications needed.  Problem Solving Problem Solving: 5-Solves basic 90% of the time/requires cueing < 10% of the time  Memory Memory: 4-Recognizes or recalls 75 - 89% of the time/requires cueing 10 - 24% of the time  Medical Problem List and Plan: 1. Functional deficits secondary to right thalamic/PLIC infarct 2.  DVT Prophylaxis/Anticoagulation:   -pt is min/guard 150'----can dc sq heparin 3. Pain Management: Tylenol as needed 4. Hypertension. Lisinopril 20 mg twice a day, Lopressor 50 mg twice a day, hydralazine 25 mg 3 times a day, Aldactone 25 mg daily.Amlodipine and HCTZ added by cardiology on 5/12 Monitor with increased mobility, Renal artery ultrasound  -bp's improved and more consistent over last 48 hours 5. Neuropsych: This patient is capable of making decisions on her own behalf. 6. Skin/Wound Care: Routine skin checks 7. Fluids/Electrolytes/Nutrition: follow I&O's and chemistries 8. Tobacco/Marijuana abuse. Counseling 9. Hyperlipidemia. Lipitor   LOS (Days) 5 A FACE TO FACE EVALUATION WAS PERFORMED  SWARTZ,ZACHARY T 10/15/2014, 6:50 AM

## 2014-10-16 ENCOUNTER — Encounter (HOSPITAL_COMMUNITY): Payer: No Typology Code available for payment source

## 2014-10-16 ENCOUNTER — Inpatient Hospital Stay (HOSPITAL_COMMUNITY): Payer: No Typology Code available for payment source | Admitting: Speech Pathology

## 2014-10-16 ENCOUNTER — Inpatient Hospital Stay (HOSPITAL_COMMUNITY): Payer: No Typology Code available for payment source

## 2014-10-16 ENCOUNTER — Inpatient Hospital Stay (HOSPITAL_COMMUNITY): Payer: No Typology Code available for payment source | Admitting: Occupational Therapy

## 2014-10-16 LAB — CULTURE, BLOOD (ROUTINE X 2)
Culture: NO GROWTH
Culture: NO GROWTH

## 2014-10-16 MED ORDER — AMLODIPINE BESYLATE 5 MG PO TABS
5.0000 mg | ORAL_TABLET | Freq: Two times a day (BID) | ORAL | Status: DC
  Administered 2014-10-16 – 2014-10-18 (×4): 5 mg via ORAL
  Filled 2014-10-16 (×6): qty 1

## 2014-10-16 NOTE — Progress Notes (Signed)
Occupational Therapy Session Note  Patient Details  Name: Brandy Roberts MRN: [id number] Date of Birth: 08/02/1970  Today's Date: 10/16/2014 OT Individual Time: 1000-1100 OT Individual Time Calculation (min): 60 min    Short Term Goals: Week 1:  OT Short Term Goal 1 (Week 1): Pt will perform bathing with Min A in order to increase i in self care tasks.  OT Short Term Goal 2 (Week 1): Pt will perform toilet transfer with Min A in order to increase I in functional transfers. OT Short Term Goal 3 (Week 1): Pt will perform LB dressing with steady assist and modifications as needed in order to increase I in self care task. OT Short Term Goal 4 (Week 1): Pt will engage in 10 minutes of dynamic standing task before needing rest break in order to increase I for occupational performance.  Skilled Therapeutic Interventions/Progress Updates:    Engaged in ADL retraining with focus on functional mobility, transfers, and increased independence and safety awareness during self-care tasks.  Pt ambulated to gather clothing prior to shower then into room shower with RW with supervision.  Educated on sitting to doff pants as pt attempting to lean against wall and doff pants. Pt completed bathing at seated level, carrying over education of lateral leans when washing buttocks instead of standing.  Completed dressing at sit > stand level from EOB with setup assist to don thigh high TEDS.  Educated on use of walker bag to transport items or placing a couple items across front bar instead of holding items in hand compromising secure BUE use on RW.  Ambulated to laundry room with items placed in w/c while pt pushed w/c > 150 feet with supervision.  Recommend supervision with laundry tasks, especially with transporting items safely.  Therapy Documentation Precautions:  Precautions Precautions: Fall Precaution Comments: difficulty with full WB left leg in standing Restrictions Weight Bearing Restrictions:  No General:   Vital Signs: Therapy Vitals Pulse Rate: 76 BP: (!) 168/71 mmHg Pain:  Pt with no c/o pain  See FIM for current functional status  Therapy/Group: Individual Therapy  Simonne Come 10/16/2014, 12:05 PM

## 2014-10-16 NOTE — Progress Notes (Signed)
Subjective:  Currently participating in rehab.  NO cardiac c/o.  Blood pressure better controlled but still up.   Objective:  Vital Signs in the last 24 hours: BP 165/63 mmHg  Pulse 72  Temp(Src) 98.3 F (36.8 C) (Oral)  Resp 18  Ht 5\' 6"  (1.676 m)  Wt 82.691 kg (182 lb 4.8 oz)  BMI 29.44 kg/m2  SpO2 98%  LMP 10/03/2014  Physical Exam: Pleasant overweight black female in no acute distress Lungs:  Clear  Cardiac:  Regular rhythm, normal S1 and S2, no S3, 2/6 systolic murmur across the aortic valve, 2/6 diastolic murmur noted Extremities:  No edema present  Intake/Output from previous day: 05/15 0701 - 05/16 0700 In: 840 [P.O.:840] Out: -  Weight Filed Weights   10/10/14 1544  Weight: 82.691 kg (182 lb 4.8 oz)   Current facility-administered medications:  .  acetaminophen (TYLENOL) tablet 325-650 mg, 325-650 mg, Oral, Q4H PRN, Lavon Paganini Angiulli, PA-C, 650 mg at 10/12/14 1420 .  amLODipine (NORVASC) tablet 5 mg, 5 mg, Oral, Daily, Jacolyn Reedy, MD, 5 mg at 10/15/14 0726 .  aspirin EC tablet 81 mg, 81 mg, Oral, Daily, Lavon Paganini Angiulli, PA-C, 81 mg at 10/15/14 0725 .  atorvastatin (LIPITOR) tablet 10 mg, 10 mg, Oral, q1800, Lavon Paganini Angiulli, PA-C, 10 mg at 10/15/14 1754 .  bisacodyl (DULCOLAX) EC tablet 10 mg, 10 mg, Oral, Daily PRN, Lavon Paganini Angiulli, PA-C .  clopidogrel (PLAVIX) tablet 75 mg, 75 mg, Oral, Daily, Lavon Paganini Angiulli, PA-C, 75 mg at 10/15/14 0726 .  docusate sodium (COLACE) capsule 100 mg, 100 mg, Oral, BID, Lavon Paganini Angiulli, PA-C, Stopped at 10/15/14 0800 .  hydrALAZINE (APRESOLINE) tablet 50 mg, 50 mg, Oral, TID WC, Jacolyn Reedy, MD, 50 mg at 10/15/14 1753 .  hydrochlorothiazide (HYDRODIURIL) tablet 25 mg, 25 mg, Oral, Daily, Jacolyn Reedy, MD, 25 mg at 10/15/14 0727 .  lisinopril (PRINIVIL,ZESTRIL) tablet 20 mg, 20 mg, Oral, BID, Lavon Paganini Angiulli, PA-C, 20 mg at 10/15/14 2014 .  metoprolol (LOPRESSOR) tablet 50 mg, 50 mg, Oral, BID, Lavon Paganini  Angiulli, PA-C, 50 mg at 10/15/14 2013 .  ondansetron (ZOFRAN) tablet 4 mg, 4 mg, Oral, Q6H PRN **OR** ondansetron (ZOFRAN) injection 4 mg, 4 mg, Intravenous, Q6H PRN, Lavon Paganini Angiulli, PA-C .  pantoprazole (PROTONIX) EC tablet 40 mg, 40 mg, Oral, Daily, Lavon Paganini Angiulli, PA-C, 40 mg at 10/14/14 0848 .  sorbitol 70 % solution 30 mL, 30 mL, Oral, Daily PRN, Lavon Paganini Angiulli, PA-C, 30 mL at 10/11/14 1753 .  spironolactone (ALDACTONE) tablet 25 mg, 25 mg, Oral, Daily, Lavon Paganini Angiulli, PA-C, 25 mg at 10/15/14 3785 .  traMADol (ULTRAM) tablet 50 mg, 50 mg, Oral, Q6H PRN, Lavon Paganini Angiulli, PA-C, 50 mg at 10/12/14 1658   Telemetry: Sinus rhythm  Assessment/Plan:   1.  Recent stroke in different territories still likely due to small vessel disease and uncontrolled hypertension 2.  Moderate to severe eccentric aortic regurgitation with small nodule seen on aortic valve.  Blood cultures are negative 3.  Cardiomyopathy likely due to uncontrolled hypertension 4.  Obesity 5.  Hypertensive heart disease blood pressure better controlled but not where needs to be.  Rec:  Increase amlodipine to 10 mg daily. Care management to help arrange primary care followup.    Kerry Hough  MD [facility]  10/16/2014, 9:06 AM

## 2014-10-16 NOTE — Progress Notes (Signed)
43 y.o. right handed female with history of hypertension and tobacco abuse. 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 Admitted 10/06/2014 with decreased balance and left-sided weakness. Blood pressure 196/88.UDS positive THC. MRI of the brain showed acute lacunar infarct lateral right thalamus posterior limb right external capsule and also the right corona radiata. Superimposed chronic lacunar infarcts in the left corona radiata and left basal ganglia. Echocardiogram with ejection fraction of 83% grade 1 diastolic dysfunction. CT angiogram head and neck with no acute arterial findings. Patient did not receive TPA.Venous doppler negative.  Subjective/Complaints: No issues overnite.  "Am I going home?"  Objective: Vital Signs: Blood pressure 165/63, pulse 72, temperature 98.3 F (36.8 C), temperature source Oral, resp. rate 18, height 5\' 6"  (1.676 m), weight 82.691 kg (182 lb 4.8 oz), last menstrual period 10/03/2014, SpO2 98 %. No results found. No results found for this or any previous visit (from the past 72 hour(s)).   HEENT: normal Cardio: RRR and no murmur Resp: CTA B/L and unlabored GI: BS positive and NT, ND Extremity:  Pulses positive and No Edema Skin:   Intact Neuro: Alert/Oriented, Flat, Cranial Nerve II-XII normal, Abnormal Sensory reduced sensation in left hand and left foot, Abnormal Motor 4/5 in Left delt, bi, tri, grip, HF, KE ADF and Abnormal FMC Ataxic/ dec FMC Musc/Skel:  Normal Gen NAD   Assessment/Plan: 1. Functional deficits secondary to right thalamic/PLIC infarct which require 3+ hours per day of interdisciplinary therapy in a comprehensive inpatient rehab setting. Physiatrist is providing close team supervision and 24 hour management of active medical problems listed below. Physiatrist and rehab team continue to assess barriers to discharge/monitor patient progress toward functional and  medical goals.    FIM: FIM - Bathing Bathing Steps Patient Completed: Chest, Right Arm, Left Arm, Abdomen, Right upper leg, Left upper leg, Front perineal area, Right lower leg (including foot), Buttocks Bathing: 4: Min-Patient completes 8-9 45f 10 parts or 75+ percent  FIM - Upper Body Dressing/Undressing Upper body dressing/undressing steps patient completed: Thread/unthread right sleeve of pullover shirt/dresss, Thread/unthread left sleeve of pullover shirt/dress, Put head through opening of pull over shirt/dress, Pull shirt over trunk Upper body dressing/undressing: 4: Steadying assist FIM - Lower Body Dressing/Undressing Lower body dressing/undressing steps patient completed: Thread/unthread right underwear leg, Thread/unthread left underwear leg, Pull underwear up/down, Thread/unthread right pants leg, Thread/unthread left pants leg, Pull pants up/down, Don/Doff right sock, Don/Doff left sock Lower body dressing/undressing: 4: Steadying Assist  FIM - Toileting Toileting steps completed by patient: Adjust clothing prior to toileting, Performs perineal hygiene, Adjust clothing after toileting Toileting: 4: Steadying assist  FIM - Radio producer Devices: Environmental consultant, Product manager Transfers: 4-To toilet/BSC: Min A (steadying Pt. > 75%), 4-From toilet/BSC: Min A (steadying Pt. > 75%)  FIM - Bed/Chair Transfer Bed/Chair Transfer Assistive Devices: Arm rests Bed/Chair Transfer: 5: Sit > Supine: Supervision (verbal cues/safety issues), 5: Supine > Sit: Supervision (verbal cues/safety issues)  FIM - Locomotion: Wheelchair Distance: 100 Locomotion: Wheelchair: 0: Activity did not occur FIM - Locomotion: Ambulation Locomotion: Ambulation Assistive Devices: Administrator Ambulation/Gait Assistance: 4: Min guard, 5: Supervision Locomotion: Ambulation: 4: Travels 150 ft or more with minimal assistance (Pt.>75%)  Comprehension Comprehension Mode:  Auditory Comprehension: 5-Follows basic conversation/direction: With no assist  Expression Expression Mode: Verbal Expression: 5-Expresses basic needs/ideas: With no assist  Social Interaction Social Interaction: 7-Interacts appropriately with others - No medications needed.  Problem Solving Problem Solving: 5-Solves basic 90% of the time/requires cueing < 10% of the time  Memory Memory: 4-Recognizes or recalls 75 - 89% of the time/requires cueing 10 - 24% of the time  Medical Problem List and Plan: 1. Functional deficits secondary to right thalamic/PLIC infarct 2.  DVT Prophylaxis/Anticoagulation:   -pt is min/guard 150'----can dc sq heparin 3. Pain Management: Tylenol as needed 4. Hypertension. Lisinopril 20 mg twice a day, Lopressor 50 mg twice a day, hydralazine 25 mg 3 times a day, Aldactone 25 mg daily.Amlodipine and HCTZ added by cardiology on 5/12 Monitor with increased mobility, Renal artery ultrasound  -bp's improved and more consistent over last 48 hours 5. Neuropsych: This patient is capable of making decisions on her own behalf. 6. Skin/Wound Care: Routine skin checks 7. Fluids/Electrolytes/Nutrition: follow I&O's and chemistries 8. Tobacco/Marijuana abuse. Counseling 9. Hyperlipidemia. Lipitor   LOS (Days) 6 A FACE TO FACE EVALUATION WAS PERFORMED  KIRSTEINS,ANDREW E 10/16/2014, 7:10 AM

## 2014-10-16 NOTE — Progress Notes (Signed)
Speech Language Pathology Daily Session Note  Patient Details  Name: Brandy Roberts MRN: [id number] Date of Birth: 12/18/1970  Today's Date: 10/16/2014 SLP Individual Time: 1400-1500 SLP Individual Time Calculation (min): 60 min  Short Term Goals: Week 1: SLP Short Term Goal 1 (Week 1): Pt will recognize and correct errors in the moment with min assist verbal cues when completing semi-complex home management/self care tasks over three consecutive sessions  SLP Short Term Goal 1 - Progress (Week 1): Progressing toward goal SLP Short Term Goal 2 (Week 1): Pt will utilize dysarthria strategies to achieve fluency in conversations with min assist verbal and visual cues.   SLP Short Term Goal 2 - Progress (Week 1): Progressing toward goal SLP Short Term Goal 3 (Week 1): Pt will utllize external aids to facilitate recall of new information with supervision cues. SLP Short Term Goal 3 - Progress (Week 1): Progressing toward goal  Skilled Therapeutic Interventions: Skilled ST treatment session focused on cognitive goals. Pt able to fill med box independently, and recalled strategy to place completed bottles upside down after placing meds in box. Pt able to verbzlize focus of each therapy without cues. The MoCA-B was administered. Pt scored 23/30 (n=>25/30). Difficulty noted on verbal fluency subtest, self correction of calculations, and delayed recall. SLP facilitated discussion of functional recall. Given mod cues, pt able to identify need to recall when bills are due, work schedules, med refills, scheduled appointments. Pt appeared to become overwhelmed when continued mail delivery at home in her absence was brought up. Pt was encouraged to contact her sister to assist with tasks in her absence, and to bring in mail for functional organization, bill paying, planning activity, if agreeable.  Pt indicated she would contact her sister this evening.   FIM:  Comprehension Comprehension Mode:  Auditory Comprehension: 5-Follows basic conversation/direction: With no assist Expression Expression Mode: Verbal Expression: 5-Expresses basic needs/ideas: With no assist Social Interaction Social Interaction: 7-Interacts appropriately with others - No medications needed. Problem Solving Problem Solving: 5-Solves basic 90% of the time/requires cueing < 10% of the time Memory Memory: 5-Recognizes or recalls 90% of the time/requires cueing < 10% of the time  Pain Pain Assessment Pain Assessment: No/denies pain  Therapy/Group: Individual Therapy   Celia B. Fulton, [facility], [place]  Shonna Chock 10/16/2014, 4:51 PM

## 2014-10-16 NOTE — Progress Notes (Signed)
Physical Therapy Session Note  Patient Details  Name: Brandy Roberts MRN: [id number] Date of Birth: [birth year]  Today's Date: 10/16/2014 PT Individual Time: 0800-0900 PT Individual Time Calculation (min): 60 min   Session 2 Time: 1600-1630 Time Calculation (min): 30 min  Short Term Goals: Week 1:  PT Short Term Goal 1 (Week 1): Pt will perform dynamic standing tasks with single UE support at min A level PT Short Term Goal 2 (Week 1): Pt will perform functional transfers at min A level PT Short Term Goal 3 (Week 1): Pt will ambulate x 100' w/ LRAD at min A level PT Short Term Goal 4 (Week 1): Pt will negotiate up/down 3 STE with R rail at min A level and min cues for safety and technique.   Skilled Therapeutic Interventions/Progress Updates:    Session 1: Session focused on gait trials w/ different devices, L side NMR, stair training, endurance. Trialed pt ambulating with hinged knee brace, WalkOn AFO, and ToeOff AFO. Hinged knee brace provides adequate support for controlling L knee hyperextension in stance phase, however L foot drags in swing phase >50% of the time. Both AFOs provided support to control foot drag but not enough control for knee hyperextension, and pt unable to tell when her L knee was hyperextending. Recommend evaluation with orthotist to determine most appropriate brace. Worked on [employer] for LLE standing at edge of mat, details below. Instructed pt in multiple trials of donning/doffing hinged knee brace with MinA progressing to supervision with min cueing. Instructed pt in stair training 2x3 stairs w/ 1 rail on R going up and L going down. Instructed pt in car transfer w/ mod cueing for form but no physical assist. Session ended in pt's room, where pt was left seated EOB w/ all needs within reach.   Session 2: Session focused on continuing gait evaluation. COP Chris from [place] present for session to observe pt's gait and suggest orthotic devices. Suggested that most  appropriate device may be [employer] ToeOff AFO with use of 1.5 heel wedges. However, full brace evaluation difficult to assess due to age and condition of pt's shoes causing decreased stability in stance bilaterally. Pt educated on shoes as foundation for all stability in walking. Pt reported she could obtain new tennis shoes. Session ended in pt's room, where pt was left seated EOB w/ all needs within reach.   Therapy Documentation Precautions:  Precautions Precautions: Fall Precaution Comments: difficulty with full WB left leg in standing Restrictions Weight Bearing Restrictions: No Pain: Pain Assessment Pain Assessment: No/denies pain Other Treatments: Treatments Neuromuscular Facilitation: Left;Forced use;Activity to increase sustained activation;Activity to increase lateral weight shifting;Other (comment);Activity to increase motor control (R foot on yoga block to increase WBing throguh L)  See FIM for current functional status  Therapy/Group: Individual Therapy  Rada Hay  Rada Hay, PT, DPT 10/16/2014, 7:39 AM

## 2014-10-17 ENCOUNTER — Inpatient Hospital Stay (HOSPITAL_COMMUNITY): Payer: No Typology Code available for payment source | Admitting: Speech Pathology

## 2014-10-17 ENCOUNTER — Inpatient Hospital Stay (HOSPITAL_COMMUNITY): Payer: No Typology Code available for payment source

## 2014-10-17 ENCOUNTER — Inpatient Hospital Stay (HOSPITAL_COMMUNITY): Payer: No Typology Code available for payment source | Admitting: Rehabilitation

## 2014-10-17 DIAGNOSIS — I351 Nonrheumatic aortic (valve) insufficiency: Secondary | ICD-10-CM

## 2014-10-17 DIAGNOSIS — I119 Hypertensive heart disease without heart failure: Secondary | ICD-10-CM

## 2014-10-17 DIAGNOSIS — I63219 Cerebral infarction due to unspecified occlusion or stenosis of unspecified vertebral arteries: Secondary | ICD-10-CM

## 2014-10-17 MED ORDER — SPIRONOLACTONE 50 MG PO TABS
50.0000 mg | ORAL_TABLET | Freq: Every day | ORAL | Status: DC
  Administered 2014-10-18: 50 mg via ORAL
  Filled 2014-10-17 (×2): qty 1

## 2014-10-17 NOTE — Consult Note (Signed)
NEUROCOGNITIVE TESTING - [facility]   MEDICAL NECESSITY:  Ms. Shyvonne Chastang was seen on the [facility] for neurocognitive testing owing to the patient's diagnosis of cerebral infarction.   Ms. Gryder was previously seen by my colleague (Dr. Beverly Gust) for a neurobehavioral status exam. Information from that assessment is as follows:    Ms. Galligan's score on a very brief mental status measure was suggestive of significant cognitive disruption, likely resulting from her stroke.  Further neurocognitive evaluation prior to discharge is recommended to better determine the nature and extent of cognitive sequelae of her stroke, particularly given her subjective cognitive complaints.  She will be scheduled for a brief neurocognitive evaluation next week for this purpose.  In addition, she may require a comprehensive neuropsychological evaluation post-discharge.  From an emotional standpoint, Ms. Wesolowski seems to be experiencing an appropriate adjustment reaction to her medical condition.  No changes in her treatment plan are likely required to address mood.  Nursing staff and other staff members are encouraged to continue allowing her to vent when needed, as she has found that helpful.   The patient was referred for neuropsychological consultation given the possibility of cognitive sequelae subsequent to the current medical status and in order to assist in treatment planning.   PROCEDURES: [2 units of 96118]  Diagnostic Interview Medical record review Behavioral observations  Neuropsychological testing  TEST RESULTS:   RBANS Indices Scaled Score Percentile Description  Immediate Memory  73 4 Impaired   Visuospatial/Constructional 89 23 Below average  Language 80 9 Borderline impaired  Attention 53 <1 Markedly impaired  Delayed Memory 86 18 Below average  Total Score 70 2 Markedly impaired    RBANS Subtests Raw  Score Percentile Description  List Learning 21 6 Borderline impaired  Story Memory 12 6 Borderline impaired  Figure Copy 18 39 Average  Line Orientation 13 19 Below average  Picture Naming 10 70 Average  Semantic Fluency 11 2 Markedly impaired  Digit Span 5 1 Markedly impaired  Coding 33 2 Markedly impaired  List Recall 4 10 Below average  List Recognition 19 12 Below average  Story Recall 4 <1 Markedly impaired  Figure recall 12 32 Average    SUMMARY & IMPRESSION: Test results revealed reduced (or impaired) aspects of learning and memory and spatial judgment with predominant issues observed in attention, processing speed, and contextual memory.   Ms. 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 It my hope that with time that her thinking abilities will improve. In the meantime, the following recommendations are offered.    RECOMMENDATIONS  Recommendations for treatment team:  . When interacting with Ms. Oriordan, directions and information should be provided in a simple, straight forward manner, and the treatment team should avoid giving multiple instructions simultaneously.  . Ms. Norwood may also benefit from being provided with multiple trials to learn new skills given the noted memory inefficiencies. In addition, he will greatly benefit from recognition cueing.  . To the extent possible, multitasking should be avoided. . Ms. Broda requires more time than typical to process information. The treatment team may benefit from waiting for a verbal response to information before presenting additional information.  . Performance will generally be best in a structured, routine, and familiar environment, as opposed to situations involving complex problems.   Recommendations for discharge planning:  . Complete a comprehensive neuropsychological evaluation as an outpatient in 2-3 months post  discharge. This can be done through Norton Pastel, PsyD by calling the following number: [phone].  . Establish long-term follow-up care with a provider knowledgeable in stroke.  . Maintain engagement in mentally, physically and cognitively stimulating activities.  . Strive to maintain a healthy lifestyle (e.g., proper diet and exercise) in order to promote physical, cognitive and emotional health.      Rutha Bouchard, Psy.D.  Clinical Neuropsychologist  Rehabilitation Psychologist

## 2014-10-17 NOTE — Progress Notes (Signed)
Physical Therapy Discharge Summary  Patient Details  Name: Brandy Roberts MRN: [id number] Date of Birth: 02-18-1971  Today's Date: 10/17/2014 PT Individual Time: 2202-5427 PT Individual Time Calculation (min): 87 min    Patient has met 9 of 10 long term goals due to improved activity tolerance, improved balance, increased strength, ability to compensate for deficits, functional use of  left upper extremity and left lower extremity and improved coordination.  Patient to discharge at an ambulatory level Modified Independent.   Patient's care partner did not participate in formal family training as pt at mod I level  Reasons goals not met: pt did not meet floor transfer goal of mod I as she requires S for safety cues.   Recommendation:  Patient will benefit from ongoing skilled PT services in outpatient setting to continue to advance safe functional mobility, address ongoing impairments in decreased balance, decreased coordination, decreased LUE/LE strength, and minimize fall risk.  Equipment: RW, L AFO  Reasons for discharge: treatment goals met and discharge from hospital  Patient/family agrees with progress made and goals achieved: Yes   PT Treatment/Intervention: Skilled session focused on grad day activities to ensure pt has met all goals for safe D/C.  Performed gait in controlled and home simulated environment with RW at [place] at mod I level.  Recommend S in community simulated environment for increased safety and min cues for safety.  Performed functional transfers with RW at mod I level, car transfer and floor transfer at S level for min safety cues.  Performed BERG balance test with score of 46/56, indicative of moderate fall risk.  Provided education to pt regarding meaning of test score and need for continued therapy to improve balance and safety.  Performed 12, 6" steps with R rail at S level to simulate home entry and community as well as strengthening at S level with cues on  how family should provide assist.  Ended session with NMR for trunk, LUE/LE with improvement of postural control and WB/weight shift to the L with WB through LUE as well as progressing to functional use of LUE with bringing cup to mouth and to table.  Educated pt on functional tasks that she can perform at home.  Pt verbalized understanding.  Pt ambulated back to room and left in room with all needs.  Pt made mod I and RN made aware.   PT Discharge Precautions/Restrictions Precautions Precautions: Fall Precaution Comments: difficulty with full WB left leg in standing Restrictions Weight Bearing Restrictions: No  Pain Pain Assessment Pain Assessment: No/denies pain Pain Score: 0-No pain    Cognition Overall Cognitive Status: Within Functional Limits for tasks assessed Arousal/Alertness: Awake/alert Orientation Level: Oriented X4 Attention: Alternating Memory: Impaired Memory Impairment: Decreased recall of new information Awareness: Appears intact Problem Solving: Appears intact Safety/Judgment: Appears intact Sensation Sensation Light Touch: Impaired Detail Light Touch Impaired Details: Impaired LLE Stereognosis: Not tested Hot/Cold: Not tested Proprioception: Impaired Detail Proprioception Impaired Details: Impaired LLE Coordination Gross Motor Movements are Fluid and Coordinated: No Fine Motor Movements are Fluid and Coordinated: No Coordination and Movement Description: Pt continues to have decreased fine motor coordination in LUE as well as decreased gross coordination during gait in LLE Heel Shin Test: difficult to perform due to strenth deficits.  Motor  Motor Motor: Hemiplegia Motor - Discharge Observations: L hemiparesis, decreased balance  Mobility Bed Mobility Bed Mobility: Supine to Sit Supine to Sit: 6: Modified independent (Device/Increase time) Sit to Supine: 6: Modified independent (Device/Increase time) Transfers Transfers: Yes Sit  to Stand: 6:  Modified independent (Device/Increase time) Stand to Sit: 6: Modified independent (Device/Increase time) Stand Pivot Transfers: 6: Modified independent (Device/Increase time) Locomotion  Ambulation Ambulation: Yes Ambulation/Gait Assistance: 6: Modified independent (Device/Increase time) Ambulation Distance (Feet): 200 Feet Assistive device: Rolling walker Gait Gait: Yes Gait Pattern: Impaired Gait Pattern: Decreased stride length;Poor foot clearance - left;Narrow base of support;Trunk flexed;Left foot flat;Decreased weight shift to left;Decreased stance time - left;Decreased step length - right Stairs / Additional Locomotion Stairs: Yes Stairs Assistance: 5: Supervision Stairs Assistance Details: Verbal cues for sequencing;Verbal cues for technique;Verbal cues for precautions/safety Stair Management Technique: One rail Right;Step to pattern;Forwards Number of Stairs: 12 Height of Stairs: 6 Wheelchair Mobility Wheelchair Mobility: No (pt ambulatory)  Trunk/Postural Assessment  Cervical Assessment Cervical Assessment: Within Functional Limits Thoracic Assessment Thoracic Assessment: Within Functional Limits Lumbar Assessment Lumbar Assessment: Within Functional Limits Postural Control Postural Control: Deficits on evaluation Protective Responses: delayed protective responses during dynamic balance  Balance Balance Balance Assessed: Yes Standardized Balance Assessment Standardized Balance Assessment: Berg Balance Test Berg Balance Test Sit to Stand: Able to stand  independently using hands Standing Unsupported: Able to stand safely 2 minutes Sitting with Back Unsupported but Feet Supported on Floor or Stool: Able to sit safely and securely 2 minutes Stand to Sit: Sits safely with minimal use of hands Transfers: Able to transfer safely, minor use of hands Standing Unsupported with Eyes Closed: Able to stand 10 seconds safely Standing Ubsupported with Feet Together: Able to  place feet together independently and stand for 1 minute with supervision From Standing, Reach Forward with Outstretched Arm: Can reach confidently >25 cm (10") From Standing Position, Pick up Object from Floor: Able to pick up shoe safely and easily From Standing Position, Turn to Look Behind Over each Shoulder: Looks behind from both sides and weight shifts well Turn 360 Degrees: Able to turn 360 degrees safely but slowly Standing Unsupported, Alternately Place Feet on Step/Stool: Able to complete 4 steps without aid or supervision Standing Unsupported, One Foot in Front: Able to plae foot ahead of the other independently and hold 30 seconds Standing on One Leg: Tries to lift leg/unable to hold 3 seconds but remains standing independently Total Score: 46 Static Sitting Balance Static Sitting - Balance Support: Feet supported Static Sitting - Level of Assistance: 6: Modified independent (Device/Increase time) Dynamic Sitting Balance Dynamic Sitting - Balance Support: Feet supported;During functional activity Dynamic Sitting - Level of Assistance: 6: Modified independent (Device/Increase time) Static Standing Balance Static Standing - Balance Support: During functional activity Static Standing - Level of Assistance: 6: Modified independent (Device/Increase time) Dynamic Standing Balance Dynamic Standing - Balance Support: During functional activity Dynamic Standing - Level of Assistance: 6: Modified independent (Device/Increase time) Extremity Assessment      RLE Assessment RLE Assessment: Within Functional Limits LLE Assessment LLE Assessment: Exceptions to [facility] LLE Strength LLE Overall Strength: Deficits LLE Overall Strength Comments: hip flex 2+/5, hip ext 3/5, knee ext 2+/5, knee flex 2/5, ankle DF 2/5, ankle PF 2+/5  See FIM for current functional status  Denice Bors 10/17/2014, 12:10 PM

## 2014-10-17 NOTE — Progress Notes (Signed)
Occupational Therapy Session Note  Patient Details  Name: Brandy Roberts MRN: [id number] Date of Birth: [birth year]  Today's Date: 10/17/2014 OT Individual Time: 5573-2202 OT Individual Time Calculation (min): 60 min    Short Term Goals: Week 1:  OT Short Term Goal 1 (Week 1): Pt will perform bathing with Min A in order to increase i in self care tasks.  OT Short Term Goal 2 (Week 1): Pt will perform toilet transfer with Min A in order to increase I in functional transfers. OT Short Term Goal 3 (Week 1): Pt will perform LB dressing with steady assist and modifications as needed in order to increase I in self care task. OT Short Term Goal 4 (Week 1): Pt will engage in 10 minutes of dynamic standing task before needing rest break in order to increase I for occupational performance.  Skilled Therapeutic Interventions/Progress Updates:    Pt seen for ADL retraining with focus on functional transfers, functional mobility, standing balance, activity tolerance, and LUE coordination. Pt received sitting in recliner. Pt ambulated throughout room to retrieve bathing and dressing needs at Mod I level using RW. Pt completed bathing and dressing sit<>stand level at Mod I with increased time. Pt ambulated to therapy gym at supervision using RW. Pt retrieved items from overhead and low cabinets at supervision level for standing balance and positioning. Engaged in functional mobility throughout therapy unit with focus on attention to LLE and activity tolerance. Pt required min cues for awareness of LLE secondary to decreased clearance during advancement of LLE. Pt ambulated approx 150'+ with 1 rest break. Pt returned to room and left sitting in recliner with all needs in reach.   Therapy Documentation Precautions:  Precautions Precautions: Fall Precaution Comments: difficulty with full WB left leg in standing Restrictions Weight Bearing Restrictions: No General:   Vital Signs:  Pain: Pain  Assessment Pain Assessment: No/denies pain Pain Score: 0-No pain  See FIM for current functional status  Therapy/Group: Individual Therapy  Duayne Cal 10/17/2014, 12:09 PM

## 2014-10-17 NOTE — Progress Notes (Signed)
Speech Language Pathology Discharge Summary  Patient Details  Name: Brandy Roberts MRN: [id number] Date of Birth: 03/03/1971  Today's Date: 10/17/2014 SLP Individual Time: 1300-1400 SLP Individual Time Calculation (min): 60 min   Skilled Therapeutic Interventions:  Pt was seen for skilled ST targeting completion of education prior to discharge tomorrow.  Pt ambulated to the ST treatment room with no cues needed for safety awareness.  SLP facilitated the session with skilled education regarding compensatory strategies for memory, emphasizing routine, use of written aids, and organizing the environment to maximize recall of daily information.  Handout was provided to maximize carryover in the home environment.  SLP also strongly recommended that pt have assistance for medication and financial management at discharge due to cognitive deficits.  Pt reports that her family will be willing to help her.  Upon return to room, pt was left upright in recliner, all needs left within reach.     Patient has met 4 of 4 long term goals.  Patient to discharge at overall Supervision level.  Reasons goals not met: n/a   Clinical Impression/Discharge Summary:  Pt made functional gains while inpatient and is discharging having met 4 out of 4 long term goals.  Pt currently requires supervision for higher level tasks  due to decreased functional problem solving, recall of new information, and difficulty with recognizing and correcting errors during tasks.  Pt initially presented with a hypokinetic dysarthria characterized by initial part word repetitions and slow, effortful speech; however, this has since resolved while pt has been on rehab and pt is mod I for use of slow rate to achieve intelligibility and fluency in conversations.  Pt is discharging home where it is recommended that she have assistance for medication and financial management due to cognitive deficits.  Pt education is complete at this time.  Pt  would benefit from follow up ST at next level of care to maximize functional independence and facilitate return to previous level of function.    Care Partner:  Caregiver Able to Provide Assistance: Yes  Type of Caregiver Assistance: Physical;Cognitive  Recommendation:  24 hour supervision/assistance;Home Health SLP;Outpatient SLP  Rationale for SLP Follow Up: Maximize functional communication;Maximize cognitive function and independence;Reduce caregiver burden   Equipment: none recommended by SLP    Reasons for discharge: Discharged from hospital   Patient/Family Agrees with Progress Made and Goals Achieved: Yes   See FIM for current functional status  Page, Nicole L 10/17/2014, 3:40 PM    

## 2014-10-17 NOTE — Plan of Care (Signed)
Problem: RH Floor Transfers Goal: LTG Patient will perform floor transfers w/assist (PT) LTG: Patient will perform floor transfers with assistance (PT).  Outcome: Not Applicable Date Met:  62/83/15 Requires S for floor transfer due to safety cues.

## 2014-10-17 NOTE — Discharge Summary (Signed)
Brandy Roberts, Brandy Roberts           ACCOUNT NO.:  [id number]  MEDICAL RECORD NO.:  [id number]  LOCATION:  4M05C                        FACILITY:  [facility]  PHYSICIAN:  Charlett Blake, M.D.DATE OF BIRTH:  06-15-1970  DATE OF ADMISSION:  10/10/2014 DATE OF DISCHARGE:  10/18/2014                              DISCHARGE SUMMARY   DISCHARGE DIAGNOSES: 1. Functional deficits secondary to right thalamic-PLIC infarct. 2. Subcutaneous heparin for DVT prophylaxis, discontinued. 3. Hypertension. 4. Tobacco and marijuana abuse. 5. Hyperlipidemia.  HISTORY OF PRESENT ILLNESS:  This is a 44 year old right-handed female with history of hypertension, tobacco abuse, independent prior to admission living with her son, working full time.  Admitted Oct 06, 2014, with decreased balance, left-sided weakness.  Blood pressure 196/88. Urine drug screen positive for marijuana.  MRI of the brain showed acute lacunar infarct, lateral right thalamus, posterior limb of right external capsule, and also right corona radiata.  Echocardiogram with ejection fraction of 41% grade 1 diastolic dysfunction.  CT angiogram of head and neck with no acute findings.  The patient did not receive tPA. Venous Doppler studies of lower extremities negative.  Maintained on aspirin and Plavix therapy per Neurology Services.  TEE completed showing moderate-to-severe eccentric aortic regurgitation, no evidence of endocarditis.  The patient was admitted for comprehensive rehab program.  PAST MEDICAL HISTORY:  See discharge diagnoses.  SOCIAL HISTORY:  Lives with her son.  Independent prior to admission.  FUNCTIONAL STATUS:  Upon admission to rehab services, moderate assist to ambulate 35 feet rolling walker, moderate assist stand pivot transfers; min to mod assist with activities of daily living.  PHYSICAL EXAMINATION:  VITAL SIGNS:  Blood pressure 186/69, pulse 56, temperature 98, respirations 18. GENERAL:  This was an alert  female, somewhat anxious, oriented to person, place, date of birth.  Followed commands.  Fair awareness of deficits. LUNGS:  Clear to auscultation. CARDIAC:  Regular rate and rhythm. ABDOMEN:  Soft, nontender.  Good bowel sounds.  REHABILITATION HOSPITAL COURSE:  The patient was admitted to inpatient rehab services with therapies initiated on a 3-hour daily basis consisting of physical therapy, occupational therapy, speech therapy, and rehabilitation nursing.  The following issues were addressed during the patient's rehabilitation stay.  Pertaining to Ms. Pultz's right thalamic-PLIC infarct remained stable, maintained on aspirin and Plavix therapy, she would follow up with neurology Services.  She was now minimal guard for ambulation.  Her subcutaneous heparin was discontinued.  Noted hypertension on multiple antihypertensive medications.  Renal ultrasound negative for renal artery stenosis.  She was stressed the need to be compliant with followup.  She did have history of tobacco and marijuana abuse.  She received full counseling in regard to cessation of these illicit products.  It was questionable if she would be compliant with these request.  The patient received weekly collaborative interdisciplinary team conferences to discuss estimated length of stay, family teaching, and any barriers to discharge.  She was instructed on multiple bouts of ambulation with a rolling walker, standby assist.  She was able to navigate around obstacles as well as lower hurdles and steps to gather her belongings for activities of daily living for dressing, grooming, homemaking.  She was advised no driving. Ongoing therapies  would be dictated as per rehab services.  DISCHARGE MEDICATIONS: 1. Norvasc 5 mg p.o. b.i.d. 2. Aspirin 81 mg p.o. daily. 3. Lipitor 10 mg p.o. daily. 4. Plavix 75 mg p.o. daily. 5. Colace 100 mg p.o. b.i.d. 6. Hydralazine 50 mg p.o. t.i.d. 7. Hydrochlorothiazide 25 mg  p.o. daily. 8. Lisinopril 20 mg p.o. b.i.d. 9. Lopressor 50 mg p.o. b.i.d. 10.Protonix 40 mg p.o. daily. 11.Aldactone 25 mg p.o. daily. 12.Ultram 50 mg p.o. every 6 hours as needed headache, dispensed 60     tablets.  DIET:  Regular.  FOLLOWUP:  She would follow up with Dr. Alysia Penna at the outpatient rehab center on November 23, 2014, Dr. Landry Corporal, call for appointment; Dr. Erlinda Hong, Neurology Services 1 month.     Lauraine Rinne, P.A.   ______________________________ Charlett Blake, M.D.    DA/MEDQ  D:  10/17/2014  T:  10/17/2014  Job:  [id number]  cc:   Dr. [name] Rana, M.D. Ezzard Standing, M.D.

## 2014-10-17 NOTE — Progress Notes (Signed)
Social Work Patient ID: Brandy Roberts, female   DOB: 05/21/1971, 44 y.o.   MRN: [id number] Met with pt to discuss equipment needs, can get rolling walker and bedside commode but tub bench is not covered will only order other two items. Pt has a high deductible to meet before eligible for OP or Home health therapies. She is concerned about her household bills being paid and can't afford Therapies.  Will ask PT to give her a home exercise program.  She has made good progress while here.

## 2014-10-17 NOTE — Progress Notes (Addendum)
44 y.o. right handed female with history of hypertension and tobacco abuse. 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 Admitted 10/06/2014 with decreased balance and left-sided weakness. Blood pressure 196/88.UDS positive THC. MRI of the brain showed acute lacunar infarct lateral right thalamus posterior limb right external capsule and also the right corona radiata. Superimposed chronic lacunar infarcts in the left corona radiata and left basal ganglia. Echocardiogram with ejection fraction of 12% grade 1 diastolic dysfunction. CT angiogram head and neck with no acute arterial findings. Patient did not receive TPA.Venous doppler negative.  Subjective/Complaints: Pt without new issues overnite, "will I go back to work?" Tried knee orthosis for "knee going backward" Review of Systems - Negative except Left side weakness Discussed smoking cessation, post discharge plans and prognosis Objective: Vital Signs: Blood pressure 146/62, pulse 63, temperature 98.3 F (36.8 C), temperature source Oral, resp. rate 18, height 5\' 6"  (1.676 m), weight 82.691 kg (182 lb 4.8 oz), last menstrual period 10/03/2014, SpO2 100 %. No results found. No results found for this or any previous visit (from the past 72 hour(s)).   HEENT: normal Cardio: RRR and no murmur Resp: CTA B/L and unlabored GI: BS positive and NT, ND Extremity:  Pulses positive and No Edema Skin:   Intact Neuro: Alert/Oriented, Flat, Cranial Nerve II-XII normal, Abnormal Sensory reduced sensation in left hand and left foot, Abnormal Motor 4/5 in Left delt, bi, tri, grip, HF, KE ADF and Abnormal FMC Ataxic/ dec FMC Musc/Skel:  Normal Gen NAD   Assessment/Plan: 1. Functional deficits secondary to right thalamic/PLIC infarct which require 3+ hours per day of interdisciplinary therapy in a comprehensive inpatient rehab setting. Physiatrist is providing close team supervision  and 24 hour management of active medical problems listed below. Physiatrist and rehab team continue to assess barriers to discharge/monitor patient progress toward functional and medical goals.    FIM: FIM - Bathing Bathing Steps Patient Completed: Chest, Right Arm, Left Arm, Abdomen, Right upper leg, Left upper leg, Front perineal area, Right lower leg (including foot), Buttocks, Left lower leg (including foot) Bathing: 6: Assistive device (Comment)  FIM - Upper Body Dressing/Undressing Upper body dressing/undressing steps patient completed: Thread/unthread right sleeve of pullover shirt/dresss, Thread/unthread left sleeve of pullover shirt/dress, Put head through opening of pull over shirt/dress, Pull shirt over trunk, Thread/unthread right bra strap, Thread/unthread left bra strap, Hook/unhook bra Upper body dressing/undressing: 5: Supervision: Safety issues/verbal cues FIM - Lower Body Dressing/Undressing Lower body dressing/undressing steps patient completed: Thread/unthread right underwear leg, Thread/unthread left underwear leg, Pull underwear up/down, Thread/unthread right pants leg, Thread/unthread left pants leg, Pull pants up/down, Don/Doff right sock, Don/Doff left sock Lower body dressing/undressing: 5: Set-up assist to: Don/Doff TED stocking  FIM - Toileting Toileting steps completed by patient: Adjust clothing prior to toileting, Performs perineal hygiene, Adjust clothing after toileting Toileting: 4: Steadying assist  FIM - Radio producer Devices: Environmental consultant, Product manager Transfers: 4-To toilet/BSC: Min A (steadying Pt. > 75%), 4-From toilet/BSC: Min A (steadying Pt. > 75%)  FIM - Bed/Chair Transfer Bed/Chair Transfer Assistive Devices: Arm rests Bed/Chair Transfer: 5: Sit > Supine: Supervision (verbal cues/safety issues), 5: Supine > Sit: Supervision (verbal cues/safety issues)  FIM - Locomotion: Wheelchair Distance: 100 Locomotion:  Wheelchair: 0: Activity did not occur FIM - Locomotion: Ambulation Locomotion: Ambulation Assistive Devices: Administrator Ambulation/Gait Assistance: 5: Supervision Locomotion: Ambulation: 5: Travels 150 ft or more with supervision/safety issues  Comprehension Comprehension Mode: Auditory Comprehension: 5-Follows basic conversation/direction: With no assist  Expression Expression Mode: Verbal Expression: 5-Expresses basic needs/ideas: With no assist  Social Interaction Social Interaction: 7-Interacts appropriately with others - No medications needed.  Problem Solving Problem Solving: 5-Solves basic 90% of the time/requires cueing < 10% of the time  Memory Memory: 5-Recognizes or recalls 90% of the time/requires cueing < 10% of the time  Medical Problem List and Plan: 1. Functional deficits secondary to right thalamic/PLIC infarct 2.  DVT Prophylaxis/Anticoagulation:   -pt is min/guard 150'----off sq heparin 3. Pain Management: Tylenol as needed 4. Hypertension. Lisinopril 20 mg twice a day, Lopressor 50 mg twice a day, hydralazine 25 mg 3 times a day, Aldactone 25 mg daily.Amlodipine and HCTZ added by cardiology on 5/12 Monitor with increased mobility, Renal artery ultrasound  -bp's improved and more consistent over last 48 hours- appreciate cardiology assist 5. Neuropsych: This patient is capable of making decisions on her own behalf. 6. Skin/Wound Care: Routine skin checks 7. Fluids/Electrolytes/Nutrition: follow I&O's and chemistries 8. Tobacco/Marijuana abuse. Counseling 9. Hyperlipidemia. Lipitor   LOS (Days) 7 A FACE TO FACE EVALUATION WAS PERFORMED  KIRSTEINS,ANDREW E 10/17/2014, 7:21 AM

## 2014-10-17 NOTE — Discharge Summary (Signed)
Discharge summary job [id number]

## 2014-10-17 NOTE — Progress Notes (Signed)
Subjective:  Currently participating in rehab.  NO cardiac c/o.  Blood pressure still up.   Objective:  Vital Signs in the last 24 hours: BP 152/57 mmHg  Pulse 63  Temp(Src) 98.3 F (36.8 C) (Oral)  Resp 18  Ht 5\' 6"  (1.676 m)  Wt 82.691 kg (182 lb 4.8 oz)  BMI 29.44 kg/m2  SpO2 100%  LMP 10/03/2014  Physical Exam: Pleasant overweight black female in no acute distress Lungs:  Clear  Cardiac:  Regular rhythm, normal S1 and S2, no S3, 2/6 systolic murmur across the aortic valve, 2/6 diastolic murmur noted Extremities:  No edema present  Intake/Output from previous day: 05/16 0701 - 05/17 0700 In: 340 [P.O.:340] Out: -  Weight Filed Weights   10/10/14 1544  Weight: 82.691 kg (182 lb 4.8 oz)   Current facility-administered medications:  .  acetaminophen (TYLENOL) tablet 325-650 mg, 325-650 mg, Oral, Q4H PRN, Lavon Paganini Angiulli, PA-C, 650 mg at 10/12/14 1420 .  amLODipine (NORVASC) tablet 5 mg, 5 mg, Oral, BID, Jacolyn Reedy, MD, 5 mg at 10/17/14 0806 .  aspirin EC tablet 81 mg, 81 mg, Oral, Daily, Lavon Paganini Angiulli, PA-C, 81 mg at 10/17/14 0806 .  atorvastatin (LIPITOR) tablet 10 mg, 10 mg, Oral, q1800, Lavon Paganini Angiulli, PA-C, 10 mg at 10/16/14 1750 .  bisacodyl (DULCOLAX) EC tablet 10 mg, 10 mg, Oral, Daily PRN, Lavon Paganini Angiulli, PA-C .  clopidogrel (PLAVIX) tablet 75 mg, 75 mg, Oral, Daily, Lavon Paganini Angiulli, PA-C, 75 mg at 10/17/14 0806 .  docusate sodium (COLACE) capsule 100 mg, 100 mg, Oral, BID, Lavon Paganini Angiulli, PA-C, Stopped at 10/15/14 0800 .  hydrALAZINE (APRESOLINE) tablet 50 mg, 50 mg, Oral, TID WC, Jacolyn Reedy, MD, 50 mg at 10/17/14 1219 .  hydrochlorothiazide (HYDRODIURIL) tablet 25 mg, 25 mg, Oral, Daily, Jacolyn Reedy, MD, 25 mg at 10/17/14 0806 .  lisinopril (PRINIVIL,ZESTRIL) tablet 20 mg, 20 mg, Oral, BID, Lavon Paganini Angiulli, PA-C, 20 mg at 10/17/14 0806 .  metoprolol (LOPRESSOR) tablet 50 mg, 50 mg, Oral, BID, Lavon Paganini Angiulli, PA-C, 50 mg at  10/17/14 0806 .  ondansetron (ZOFRAN) tablet 4 mg, 4 mg, Oral, Q6H PRN **OR** ondansetron (ZOFRAN) injection 4 mg, 4 mg, Intravenous, Q6H PRN, Lavon Paganini Angiulli, PA-C .  pantoprazole (PROTONIX) EC tablet 40 mg, 40 mg, Oral, Daily, Lavon Paganini Angiulli, PA-C, 40 mg at 10/17/14 0806 .  sorbitol 70 % solution 30 mL, 30 mL, Oral, Daily PRN, Lavon Paganini Angiulli, PA-C, 30 mL at 10/11/14 1753 .  [START ON 10/18/2014] spironolactone (ALDACTONE) tablet 50 mg, 50 mg, Oral, Daily, Jacolyn Reedy, MD .  traMADol Veatrice Bourbon) tablet 50 mg, 50 mg, Oral, Q6H PRN, Lavon Paganini Angiulli, PA-C, 50 mg at 10/12/14 1658   Telemetry: Sinus rhythm  Assessment/Plan:   1.  Recent stroke in different territories still likely due to small vessel disease and uncontrolled hypertension 2.  Moderate to severe eccentric aortic regurgitation with small nodule seen on aortic valve.  Blood cultures are negative 3.  Cardiomyopathy likely due to uncontrolled hypertension 4.  Obesity 5.  Hypertensive heart disease blood pressure better controlled but not where needs to be.  Rec:  Increase amlodipine to 10 mg daily.Increase spironolactone to 50 mg daily.   Kerry Hough  MD [facility]  10/17/2014, 1:10 PM

## 2014-10-17 NOTE — Progress Notes (Signed)
Social Work Patient ID: Brandy Roberts, female   DOB: 1970-08-18, 44 y.o.   MRN: [id number] Team feels pt will be ready for discharge tomorrow will meet her goals.  MD in agreement and feels she is medically stable. Trying to see if can provide assistance with medicines but pt has health insurance. Work on resources, aware of need to go to DSS for assistance with  Household bills and water cut off yesterday. Also gave her information on [employer].

## 2014-10-18 LAB — BASIC METABOLIC PANEL
Anion gap: 8 (ref 5–15)
BUN: 23 mg/dL — AB (ref 6–20)
CALCIUM: 9.2 mg/dL (ref 8.9–10.3)
CO2: 27 mmol/L (ref 22–32)
CREATININE: 1.05 mg/dL — AB (ref 0.44–1.00)
Chloride: 101 mmol/L (ref 101–111)
GFR calc non Af Amer: >60 mL/min (ref >60)
GLUCOSE: 100 mg/dL — AB (ref 65–99)
Potassium: 4 mmol/L (ref 3.5–5.1)
Sodium: 136 mmol/L (ref 135–145)

## 2014-10-18 MED ORDER — ATORVASTATIN CALCIUM 10 MG PO TABS: 10.0000 mg | ORAL_TABLET | Freq: Every day | ORAL | Status: DC

## 2014-10-18 MED ORDER — AMLODIPINE BESYLATE 5 MG PO TABS: 5.0000 mg | ORAL_TABLET | Freq: Two times a day (BID) | ORAL | Status: DC

## 2014-10-18 MED ORDER — SPIRONOLACTONE 50 MG PO TABS: 50.0000 mg | ORAL_TABLET | Freq: Every day | ORAL | Status: DC

## 2014-10-18 MED ORDER — TRAMADOL HCL 50 MG PO TABS: 50.0000 mg | ORAL_TABLET | Freq: Four times a day (QID) | ORAL | Status: DC | PRN

## 2014-10-18 MED ORDER — CLOPIDOGREL BISULFATE 75 MG PO TABS: 75.0000 mg | ORAL_TABLET | Freq: Every day | ORAL | Status: DC

## 2014-10-18 MED ORDER — ASPIRIN 81 MG PO TBEC
81.0000 mg | DELAYED_RELEASE_TABLET | Freq: Every day | ORAL | Status: DC
Start: 2014-10-18 — End: 2014-10-31

## 2014-10-18 MED ORDER — HYDRALAZINE HCL 50 MG PO TABS: 50.0000 mg | ORAL_TABLET | Freq: Three times a day (TID) | ORAL | Status: DC

## 2014-10-18 MED ORDER — LISINOPRIL 20 MG PO TABS: 20.0000 mg | ORAL_TABLET | Freq: Two times a day (BID) | ORAL | Status: DC

## 2014-10-18 MED ORDER — METOPROLOL TARTRATE 50 MG PO TABS: 50.0000 mg | ORAL_TABLET | Freq: Two times a day (BID) | ORAL | Status: DC

## 2014-10-18 MED ORDER — HYDROCHLOROTHIAZIDE 25 MG PO TABS: 25.0000 mg | ORAL_TABLET | Freq: Every day | ORAL | Status: DC

## 2014-10-18 NOTE — Progress Notes (Signed)
Social Work Discharge Note Discharge Note  The overall goal for the admission was met for:   Discharge location: Yes  Length of Stay: Yes  Discharge activity level: Yes  Home/community participation: Yes  Services provided included: MD, RD, PT, OT, SLP, RN, CM, TR, Pharmacy, Neuropsych and SW  Financial Services: Private Insurance: [place]  Follow-up services arranged: DME: [facility] and Patient/Family has no preference for HH/DME agencies  Comments (or additional information):PT   Patient/Family verbalized understanding of follow-up arrangements: Yes  Individual responsible for coordination of the follow-up plan: [place]  Confirmed correct DME delivered: Elease Hashimoto 10/18/2014    Elease Hashimoto

## 2014-10-18 NOTE — Progress Notes (Signed)
Received a call from [facility] in the lab and she stated they didn't have enough specimen to do patient's plasma metanephrine test. Therefore, the test needed to be reordered. I notified Dr. Barbette Hair, who ordered the test, and he stated, it can be done as an out patient. Dr. Wynonia Lawman, the Cardiologist, would like her BMP and for her to have a primary care MD before she leaves. The BMP was completed and oncoming nurse will be taking care of the Primary Care MD.

## 2014-10-18 NOTE — Discharge Instructions (Signed)
Inpatient Rehab Discharge Instructions  Brandy Roberts Discharge date and time: No discharge date for patient encounter.   Activities/Precautions/ Functional Status: Activity: activity as tolerated Diet: regular diet Wound Care: none needed Functional status:  ___ No restrictions     ___ Walk up steps independently ___ 24/7 supervision/assistance   ___ Walk up steps with assistance ___ Intermittent supervision/assistance  ___ Bathe/dress independently ___ Walk with walker     ___ Bathe/dress with assistance ___ Walk Independently    ___ Shower independently __x STROKE/TIA DISCHARGE INSTRUCTIONS SMOKING Cigarette smoking nearly doubles your risk of having a stroke & is the single most alterable risk factor  If you smoke or have smoked in the last 12 months, you are advised to quit smoking for your health.  Most of the excess cardiovascular risk related to smoking disappears within a year of stopping.  Ask you doctor about anti-smoking medications  [place] Quit Line: 1-800-QUIT NOW  Free Smoking Cessation Classes [phone]  CHOLESTEROL Know your levels; limit fat & cholesterol in your diet  Lipid Panel     Component Value Date/Time   CHOL 179 10/07/2014 0613   TRIG 105 10/07/2014 0613   HDL 45 10/07/2014 0613   CHOLHDL 4.0 10/07/2014 0613   VLDL 21 10/07/2014 0613   LDLCALC 113* 10/07/2014 0613      Many patients benefit from treatment even if their cholesterol is at goal.  Goal: Total Cholesterol (CHOL) less than 160  Goal:  Triglycerides (TRIG) less than 150  Goal:  HDL greater than 40  Goal:  LDL (LDLCALC) less than 100   BLOOD PRESSURE American Stroke Association blood pressure target is less that 120/80 mm/Hg  Your discharge blood pressure is:  BP: (!) 146/62 mmHg  Monitor your blood pressure  Limit your salt and alcohol intake  Many individuals will require more than one medication for high blood pressure  DIABETES (A1c is a blood sugar average for  last 3 months) Goal HGBA1c is under 7% (HBGA1c is blood sugar average for last 3 months)  Diabetes: No known diagnosis of diabetes    Lab Results  Component Value Date   HGBA1C 5.8* 10/07/2014     Your HGBA1c can be lowered with medications, healthy diet, and exercise.  Check your blood sugar as directed by your physician  Call your physician if you experience unexplained or low blood sugars.  PHYSICAL ACTIVITY/REHABILITATION Goal is 30 minutes at least 4 days per week  Activity: Increase activity slowly, Therapies: Physical Therapy: Home Health Return to work:   Activity decreases your risk of heart attack and stroke and makes your heart stronger.  It helps control your weight and blood pressure; helps you relax and can improve your mood.  Participate in a regular exercise program.  Talk with your doctor about the best form of exercise for you (dancing, walking, swimming, cycling).  DIET/WEIGHT Goal is to maintain a healthy weight  Your discharge diet is: Diet Heart Room service appropriate?: Yes; Fluid consistency:: Thin  liquids Your height is:  Height: 5\' 6"  (167.6 cm) Your current weight is: Weight: 82.691 kg (182 lb 4.8 oz) Your Body Mass Index (BMI) is:  BMI (Calculated): 29.5  Following the type of diet specifically designed for you will help prevent another stroke.  Your goal weight range is:    Your goal Body Mass Index (BMI) is 19-24.  Healthy food habits can help reduce 3 risk factors for stroke:  High cholesterol, hypertension, and excess weight.  RESOURCES  Stroke/Support Group:  Call [phone]   STROKE EDUCATION PROVIDED/REVIEWED AND GIVEN TO PATIENT Stroke warning signs and symptoms How to activate emergency medical system (call 911). Medications prescribed at discharge. Need for follow-up after discharge. Personal risk factors for stroke. Pneumonia vaccine given:  Flu vaccine given:  My questions have been answered, the writing is legible, and I  understand these instructions.  I will adhere to these goals & educational materials that have been provided to me after my discharge from the hospital.    _ Walk with assistance    ___ Shower with assistance ___ No alcohol     ___ Return to work/school ________  Special Instructions:    COMMUNITY REFERRALS UPON DISCHARGE; PATIENT'S INSURANCE HAS A $500 CO-PAY PATIENT CAN NOT AFFORD THIS. HOME EXERCISE PROGRAM GIVEN TO PATIENT  Medical Equipment/Items [place]  Agency/Supplier:[facility]   [phone] Other:DSS FOR ASSISTANCE WITH HOUSEHOLD BILLS AND URBAN MINISTRIES  GENERAL COMMUNITY RESOURCES FOR PATIENT/FAMILY: Support Groups:[facility] SUPPORT GROUP   My questions have been answered and I understand these instructions. I will adhere to these goals and the provided educational materials after my discharge from the hospital.  Patient/Caregiver Signature _______________________________ Date __________  Clinician Signature _______________________________________ Date __________  Please bring this form and your medication list with you to all your follow-up doctor's appointments.

## 2014-10-18 NOTE — Progress Notes (Signed)
44 y.o. right handed female with history of hypertension and tobacco abuse. 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 Admitted 10/06/2014 with decreased balance and left-sided weakness. Blood pressure 196/88.UDS positive THC. MRI of the brain showed acute lacunar infarct lateral right thalamus posterior limb right external capsule and also the right corona radiata. Superimposed chronic lacunar infarcts in the left corona radiata and left basal ganglia. Echocardiogram with ejection fraction of 38% grade 1 diastolic dysfunction. CT angiogram head and neck with no acute arterial findings. Patient did not receive TPA.Venous doppler negative.  Subjective/Complaints: No issues overnite Review of Systems - Negative except Left side weakness  Objective: Vital Signs: Blood pressure 129/51, pulse 58, temperature 97.7 F (36.5 C), temperature source Oral, resp. rate 18, height 5\' 6"  (1.676 m), weight 82.691 kg (182 lb 4.8 oz), last menstrual period 10/03/2014, SpO2 99 %. No results found. No results found for this or any previous visit (from the past 72 hour(s)).   HEENT: normal Cardio: RRR and no murmur Resp: CTA B/L and unlabored GI: BS positive and NT, ND Extremity:  Pulses positive and No Edema Skin:   Intact Neuro: Alert/Oriented, Flat, Cranial Nerve II-XII normal, Abnormal Sensory reduced sensation in left hand and left foot, Abnormal Motor 4/5 in Left delt, bi, tri, grip, HF, KE ADF and Abnormal FMC Ataxic/ dec FMC Musc/Skel:  Normal Gen NAD   Assessment/Plan: 1. Functional deficits secondary to right thalamic/PLIC infarct  Stable for D/C today F/u PCP in 1-2 weeks F/u PM&R 3 weeks See D/C summary See D/C instructions   FIM: FIM - Bathing Bathing Steps Patient Completed: Chest, Right Arm, Left Arm, Abdomen, Right upper leg, Left upper leg, Front perineal area, Right lower leg (including foot), Buttocks, Left  lower leg (including foot) Bathing: 6: Assistive device (Comment) (shower chair)  FIM - Upper Body Dressing/Undressing Upper body dressing/undressing steps patient completed: Thread/unthread right sleeve of pullover shirt/dresss, Thread/unthread left sleeve of pullover shirt/dress, Put head through opening of pull over shirt/dress, Pull shirt over trunk Upper body dressing/undressing: 6: More than reasonable amount of time FIM - Lower Body Dressing/Undressing Lower body dressing/undressing steps patient completed: Thread/unthread right underwear leg, Thread/unthread left underwear leg, Pull underwear up/down, Thread/unthread right pants leg, Thread/unthread left pants leg, Pull pants up/down, Don/Doff right sock, Don/Doff left sock Lower body dressing/undressing: 6: More than reasonable amount of time  FIM - Toileting Toileting steps completed by patient: Adjust clothing prior to toileting, Performs perineal hygiene, Adjust clothing after toileting Toileting: 6: More than reasonable amount of time  FIM - Radio producer Devices: Environmental consultant, Product manager Transfers: 6-More than reasonable amt of time, 6-From toilet/BSC  FIM - Control and instrumentation engineer Devices: Copy: 6: Bed > Chair or W/C: No assist  FIM - Locomotion: Wheelchair Distance: 100 Locomotion: Wheelchair: 0: Activity did not occur FIM - Locomotion: Ambulation Locomotion: Ambulation Assistive Devices: Administrator Ambulation/Gait Assistance: 6: Modified independent (Device/Increase time) Locomotion: Ambulation: 5: Travels 150 ft or more with supervision/safety issues  Comprehension Comprehension Mode: Auditory Comprehension: 6-Follows complex conversation/direction: With extra time/assistive device  Expression Expression Mode: Verbal Expression: 6-Expresses complex ideas: With extra time/assistive device  Social Interaction Social Interaction:  6-Interacts appropriately with others with medication or extra time (anti-anxiety, antidepressant).  Problem Solving Problem Solving: 5-Solves complex 90% of the time/cues < 10% of the time  Memory Memory: 5-Recognizes or recalls 90% of the time/requires  cueing < 10% of the time  Medical Problem List and Plan: 1. Functional deficits secondary to right thalamic/PLIC infarct 2.  DVT Prophylaxis/Anticoagulation:   -pt is min/guard 150'----off sq heparin 3. Pain Management: Tylenol as needed 4. Hypertension. Lisinopril 20 mg twice a day, Lopressor 50 mg twice a day, hydralazine 25 mg 3 times a day, Aldactone 25 mg daily.Amlodipine and HCTZ added by cardiology on 5/12 Monitor with increased mobility, Renal artery ultrasound  -bp's improved and more consistent over last 48 hours- appreciate cardiology assist, metanephrine levels not done, will see if cardiology wants this completed still    LOS (Days) [place] E 10/18/2014, 7:23 AM

## 2014-10-18 NOTE — Patient Care Conference (Signed)
Inpatient RehabilitationTeam Conference and Plan of Care Update Date: 10/18/2014   Time: 11:41 AM    Patient Name: Brandy Roberts      Medical Record Number: [id number]  Date of Birth: 1971-04-10 Sex: Female         Room/Bed: 4M05C/4M05C-01 Payor Info: Payor: [employer] / Plan: Holley Bouche / Product Type: *No Product type* /    Admitting Diagnosis: R CVA  Admit Date/Time:  10/10/2014  3:23 PM Admission Comments: No comment available   Primary Diagnosis:  <principal problem not specified> Principal Problem: <principal problem not specified>  Patient Active Problem List   Diagnosis Date Noted  . Acute ischemic VBA thalamic stroke 10/10/2014  . Aortic valve vegetation   . Stroke   . Hypertensive heart disease   . Overweight (BMI 25.0-29.9)   . H/O noncompliance with medical treatment, presenting hazards to health   . Aortic regurgitation   . Tobacco use disorder 10/07/2014  . Polysubstance abuse 10/07/2014  . Malignant hypertension   . Cerebral infarction due to thrombosis of right middle cerebral artery   . Hyperlipidemia   . Embolic stroke involving right middle cerebral artery 10/06/2014  . Embolic stroke involving right posterior cerebral artery 10/06/2014    Expected Discharge Date: Expected Discharge Date: 10/18/14  Team Members Present: Physician leading conference: Dr. Alysia Roberts Social Worker Present: Brandy Kin, LCSW Nurse Present: Brandy Roberts, RN PT Present: Brandy Roberts, PT OT Present: Brandy Roberts, OT SLP Present: Brandy Roberts, SLP PPS Coordinator present : Brandy Nakayama, RN, CRRN     Current Status/Progress Goal Weekly Team Focus  Medical     medically stable for DC   BP managed-needs to be followed by PCP     Bowel/Bladder   continent of bowel and bladder   remain continent of bowel and bladder  Offer restroom frequently    Swallow/Nutrition/ Hydration     na        ADL's     supervision with some cueing   mod/i     Mobility   supervision/mod/i level   ambulating with rolling walker-mod/i-made in room     Communication     na        Safety/Cognition/ Behavioral Observations    no unsafe behaviors        Pain   Pt denies pain  manage pain less than 3.   Assess and manage pain frequently   Skin   Clean, Dry, intact   Remain free of breakdown  Assess skin Q shift and monitor for breakdown.     Rehab Goals Patient on target to meet rehab goals: Yes *See Care Plan and progress notes for long and short-term goals.  Barriers to Discharge:   cost of medicines   Possible Resolutions to Barriers:    compliance with follow up and medicines   Discharge Planning/Teaching Needs:    Home with son and sister's who will be checking in on her. Intermittent supervision per family.     Team Discussion:  Pt reached supervision/mod/i level goals-concern is medication cost and insurance deductibles. Arranged PCP for follow up and pt encouraged to be compliant with follow up and medications. HTN regulated now on meds  Revisions to Treatment Plan:  Reached goals sooner and ready for DC today      Brandy Roberts 10/18/2014, 11:41 AM

## 2014-10-18 NOTE — Progress Notes (Signed)
Orthopedic Tech Progress Note Patient Details:  Marilea Gwynne Apr 11, 1971 [id number]  Patient ID: [name], female   DOB: 1970/10/17, 44 y.o.   MRN: [id number] Called in advanced brace order; spoke with Evangeline Dakin, Tracey Hermance 10/18/2014, 10:02 AM

## 2014-10-18 NOTE — Progress Notes (Signed)
Subjective:  Going home today.  Feeling better.  No C/o.   Objective:  Vital Signs in the last 24 hours: BP 133/59 mmHg  Pulse 62  Temp(Src) 97.7 F (36.5 C) (Oral)  Resp 18  Ht 5\' 6"  (1.676 m)  Wt 82.691 kg (182 lb 4.8 oz)  BMI 29.44 kg/m2  SpO2 99%  LMP 10/03/2014  Physical Exam: Pleasant overweight black female in no acute distress Lungs:  Clear  Cardiac:  Regular rhythm, normal S1 and S2, no S3, 2/6 systolic murmur across the aortic valve, 2/6 diastolic murmur noted Extremities:  No edema present  Intake/Output from previous day: 05/17 0701 - 05/18 0700 In: 850 [P.O.:850] Out: -  Weight Filed Weights   10/10/14 1544  Weight: 82.691 kg (182 lb 4.8 oz)   Current facility-administered medications:  .  acetaminophen (TYLENOL) tablet 325-650 mg, 325-650 mg, Oral, Q4H PRN, Lavon Paganini Angiulli, PA-C, 650 mg at 10/12/14 1420 .  amLODipine (NORVASC) tablet 5 mg, 5 mg, Oral, BID, Jacolyn Reedy, MD, 5 mg at 10/18/14 0816 .  aspirin EC tablet 81 mg, 81 mg, Oral, Daily, Lavon Paganini Angiulli, PA-C, 81 mg at 10/18/14 0816 .  atorvastatin (LIPITOR) tablet 10 mg, 10 mg, Oral, q1800, Lavon Paganini Angiulli, PA-C, 10 mg at 10/17/14 1720 .  bisacodyl (DULCOLAX) EC tablet 10 mg, 10 mg, Oral, Daily PRN, Lavon Paganini Angiulli, PA-C .  clopidogrel (PLAVIX) tablet 75 mg, 75 mg, Oral, Daily, Lavon Paganini Angiulli, PA-C, 75 mg at 10/18/14 0816 .  docusate sodium (COLACE) capsule 100 mg, 100 mg, Oral, BID, Lavon Paganini Angiulli, PA-C, Stopped at 10/15/14 0800 .  hydrALAZINE (APRESOLINE) tablet 50 mg, 50 mg, Oral, TID WC, Jacolyn Reedy, MD, 50 mg at 10/18/14 0816 .  hydrochlorothiazide (HYDRODIURIL) tablet 25 mg, 25 mg, Oral, Daily, Jacolyn Reedy, MD, 25 mg at 10/18/14 [phone]  lisinopril (PRINIVIL,ZESTRIL) tablet 20 mg, 20 mg, Oral, BID, Lavon Paganini Angiulli, PA-C, 20 mg at 10/18/14 0815 .  metoprolol (LOPRESSOR) tablet 50 mg, 50 mg, Oral, BID, Lavon Paganini Angiulli, PA-C, 50 mg at 10/18/14 0815 .  ondansetron  (ZOFRAN) tablet 4 mg, 4 mg, Oral, Q6H PRN **OR** ondansetron (ZOFRAN) injection 4 mg, 4 mg, Intravenous, Q6H PRN, Lavon Paganini Angiulli, PA-C .  pantoprazole (PROTONIX) EC tablet 40 mg, 40 mg, Oral, Daily, Lavon Paganini Angiulli, PA-C, 40 mg at 10/18/14 0817 .  sorbitol 70 % solution 30 mL, 30 mL, Oral, Daily PRN, Lavon Paganini Angiulli, PA-C, 30 mL at 10/11/14 1753 .  spironolactone (ALDACTONE) tablet 50 mg, 50 mg, Oral, Daily, Jacolyn Reedy, MD, 50 mg at 10/18/14 0814 .  traMADol (ULTRAM) tablet 50 mg, 50 mg, Oral, Q6H PRN, Lavon Paganini Angiulli, PA-C, 50 mg at 10/12/14 1658   Assessment/Plan:   1.  Recent stroke in different territories still likely due to small vessel disease and uncontrolled hypertension 2.  Moderate to severe eccentric aortic regurgitation with small nodule seen on aortic valve.  Blood cultures are negative 3.  Cardiomyopathy likely due to uncontrolled hypertension 4.  Obesity 5.  Hypertensive heart disease blood pressure better controlled  Rec:  OK for d/c today.  Will need good medical f/u.  Emphasized importance of compliance and d/c smoking. May be best to change hydralazine to BID to help with compliance.   Kerry Hough  MD [facility]  10/18/2014, 9:11 AM

## 2014-10-18 NOTE — Progress Notes (Signed)
Occupational Therapy Discharge Summary  Patient Details  Name: Brandy Roberts MRN: [id number] Date of Birth: 04/16/1971    Patient has met 11 of 11 long term goals due to improved activity tolerance, improved balance, ability to compensate for deficits, functional use of  LEFT upper and LEFT lower extremity, improved attention, improved awareness and improved coordination.  Patient to discharge at overall Modified Independent level.  Patient's care partner did not attend family training as pt is  Mod I  assistance at discharge.    Reasons goals not met: all goals met  Recommendation:  Patient will benefit from ongoing skilled OT services in outpatient setting to continue to advance functional skills in the area of BADL and iADL.  Equipment: OT recommended TTB and safety treads  Reasons for discharge: treatment goals met and discharge from hospital  Patient/family agrees with progress made and goals achieved: Yes  OT Discharge Precautions/Restrictions  Precautions Precautions: Fall Precaution Comments: difficulty with full WB left leg in standing Restrictions Weight Bearing Restrictions: No Cognition Overall Cognitive Status: Impaired/Different from baseline Arousal/Alertness: Awake/alert Orientation Level: Oriented X4 Attention: Selective Selective Attention: Appears intact Memory: Impaired Memory Impairment: Decreased recall of new information;Decreased short term memory Decreased Short Term Memory: Verbal complex Awareness: Appears intact Awareness Impairment: Emergent impairment Problem Solving: Impaired Problem Solving Impairment: Functional complex Executive Function: Self Monitoring;Self Correcting Self Monitoring: Impaired Self Monitoring Impairment: Functional complex Self Correcting: Impaired Self Correcting Impairment: Functional complex Safety/Judgment: Appears intact Sensation Sensation Light Touch: Impaired Detail Light Touch Impaired Details:  Impaired LLE Stereognosis: Not tested Hot/Cold: Not tested Hot/Cold Impaired Details: Impaired LUE;Impaired LLE Proprioception: Impaired Detail Proprioception Impaired Details: Impaired LLE Coordination Gross Motor Movements are Fluid and Coordinated: No Fine Motor Movements are Fluid and Coordinated: No Coordination and Movement Description: Pt continues to have decreased fine motor coordination in LUE as well as decreased gross coordination during gait in LLE Motor  Motor Motor: Hemiplegia Motor - Discharge Observations: L hemiparesis, decreased balance Mobility  Bed Mobility Bed Mobility: Supine to Sit Supine to Sit: 6: Modified independent (Device/Increase time) Supine to Sit Details: Verbal cues for sequencing;Verbal cues for technique;Verbal cues for precautions/safety Sit to Supine: 6: Modified independent (Device/Increase time) Sit to Supine - Details: Verbal cues for sequencing;Verbal cues for technique;Verbal cues for precautions/safety Transfers Sit to Stand: 6: Modified independent (Device/Increase time) Stand to Sit: 6: Modified independent (Device/Increase time)  Trunk/Postural Assessment  Cervical Assessment Cervical Assessment: Within Functional Limits Thoracic Assessment Thoracic Assessment: Within Functional Limits Lumbar Assessment Lumbar Assessment: Within Functional Limits Postural Control Postural Control: Deficits on evaluation Protective Responses: delayed protective responses during dynamic balance  Balance Balance Balance Assessed: Yes Static Sitting Balance Static Sitting - Balance Support: Feet supported Static Sitting - Level of Assistance: 6: Modified independent (Device/Increase time) Dynamic Sitting Balance Dynamic Sitting - Balance Support: Feet supported;During functional activity Dynamic Sitting - Level of Assistance: 6: Modified independent (Device/Increase time) Static Standing Balance Static Standing - Balance Support: During  functional activity Static Standing - Level of Assistance: 6: Modified independent (Device/Increase time) Dynamic Standing Balance Dynamic Standing - Balance Support: During functional activity Dynamic Standing - Level of Assistance: 6: Modified independent (Device/Increase time) Extremity/Trunk Assessment RUE Assessment RUE Assessment: Within Functional Limits LUE Assessment LUE Assessment: Within Functional Limits (3+/5)  See FIM for current functional status  Pittman, Katie L 10/18/2014, 5:25 PM  

## 2014-10-18 NOTE — Progress Notes (Signed)
Patient discharged to home accompanied by her significant other.

## 2014-10-20 ENCOUNTER — Encounter: Payer: Self-pay | Admitting: Physical Medicine & Rehabilitation

## 2014-10-20 ENCOUNTER — Telehealth: Payer: Self-pay | Admitting: *Deleted

## 2014-10-20 NOTE — Telephone Encounter (Signed)
Called pt, made 2 attempts, left message that letter was written and signed, letter is in the collapseable file folder up by the front desk

## 2014-10-20 NOTE — Telephone Encounter (Signed)
Pt discharged from [facility] on 10/10/2014 under your care. Pt is requesting a note for her employer [employer]) stating that the pt is not fit for work for X amount of time (she says at least a couple of months), and why (diagnosis).  Pt was told by other physicians (?) that she needed to contact you to get this note

## 2014-10-20 NOTE — Telephone Encounter (Signed)
Letter dictated and signed

## 2014-10-23 ENCOUNTER — Encounter (HOSPITAL_COMMUNITY): Payer: Self-pay

## 2014-10-23 ENCOUNTER — Emergency Department (HOSPITAL_COMMUNITY)
Admission: EM | Admit: 2014-10-23 | Discharge: 2014-10-23 | Disposition: A | Discharge status: Home / Self Care | Payer: No Typology Code available for payment source | Attending: Emergency Medicine | Admitting: Emergency Medicine

## 2014-10-23 ENCOUNTER — Emergency Department (HOSPITAL_COMMUNITY): Payer: No Typology Code available for payment source

## 2014-10-23 ENCOUNTER — Ambulatory Visit: Payer: No Typology Code available for payment source | Attending: Family Medicine | Admitting: Family Medicine

## 2014-10-23 ENCOUNTER — Encounter: Payer: Self-pay | Admitting: Family Medicine

## 2014-10-23 VITALS — BP 151/72 | HR 56 | Temp 98.1°F | Resp 18 | Ht 66.0 in | Wt 185.0 lb

## 2014-10-23 DIAGNOSIS — I119 Hypertensive heart disease without heart failure: Secondary | ICD-10-CM

## 2014-10-23 DIAGNOSIS — I43 Cardiomyopathy in diseases classified elsewhere: Secondary | ICD-10-CM

## 2014-10-23 DIAGNOSIS — Z87891 Personal history of nicotine dependence: Secondary | ICD-10-CM | POA: Insufficient documentation

## 2014-10-23 DIAGNOSIS — I63431 Cerebral infarction due to embolism of right posterior cerebral artery: Secondary | ICD-10-CM | POA: Diagnosis not present

## 2014-10-23 DIAGNOSIS — H538 Other visual disturbances: Secondary | ICD-10-CM | POA: Diagnosis not present

## 2014-10-23 DIAGNOSIS — Z0471 Encounter for examination and observation following alleged adult physical abuse: Secondary | ICD-10-CM | POA: Diagnosis not present

## 2014-10-23 DIAGNOSIS — E785 Hyperlipidemia, unspecified: Secondary | ICD-10-CM | POA: Diagnosis not present

## 2014-10-23 DIAGNOSIS — Z8673 Personal history of transient ischemic attack (TIA), and cerebral infarction without residual deficits: Secondary | ICD-10-CM | POA: Diagnosis not present

## 2014-10-23 DIAGNOSIS — Z7982 Long term (current) use of aspirin: Secondary | ICD-10-CM | POA: Insufficient documentation

## 2014-10-23 DIAGNOSIS — I429 Cardiomyopathy, unspecified: Secondary | ICD-10-CM

## 2014-10-23 DIAGNOSIS — Z7902 Long term (current) use of antithrombotics/antiplatelets: Secondary | ICD-10-CM | POA: Insufficient documentation

## 2014-10-23 DIAGNOSIS — T148XXA Other injury of unspecified body region, initial encounter: Secondary | ICD-10-CM

## 2014-10-23 DIAGNOSIS — Z79899 Other long term (current) drug therapy: Secondary | ICD-10-CM | POA: Diagnosis not present

## 2014-10-23 DIAGNOSIS — I63411 Cerebral infarction due to embolism of right middle cerebral artery: Secondary | ICD-10-CM | POA: Diagnosis not present

## 2014-10-23 DIAGNOSIS — T148 Other injury of unspecified body region: Secondary | ICD-10-CM | POA: Diagnosis not present

## 2014-10-23 DIAGNOSIS — G8190 Hemiplegia, unspecified affecting unspecified side: Secondary | ICD-10-CM

## 2014-10-23 DIAGNOSIS — G8194 Hemiplegia, unspecified affecting left nondominant side: Secondary | ICD-10-CM | POA: Insufficient documentation

## 2014-10-23 DIAGNOSIS — I10 Essential (primary) hypertension: Secondary | ICD-10-CM | POA: Diagnosis not present

## 2014-10-23 HISTORY — DX: Hypertensive heart disease without heart failure: I11.9

## 2014-10-23 LAB — BASIC METABOLIC PANEL
ANION GAP: 10 (ref 5–15)
BUN: 26 mg/dL — ABNORMAL HIGH (ref 6–20)
CO2: 23 mmol/L (ref 22–32)
Calcium: 9.7 mg/dL (ref 8.9–10.3)
Chloride: 102 mmol/L (ref 101–111)
Creatinine, Ser: 1.01 mg/dL — ABNORMAL HIGH (ref 0.44–1.00)
GFR calc Af Amer: >60 mL/min (ref >60)
GFR calc non Af Amer: >60 mL/min (ref >60)
GLUCOSE: 102 mg/dL — AB (ref 65–99)
Potassium: 3.4 mmol/L — ABNORMAL LOW (ref 3.5–5.1)
Sodium: 135 mmol/L (ref 135–145)

## 2014-10-23 LAB — CBC WITH DIFFERENTIAL/PLATELET
BASOS PCT: 0 % (ref 0–1)
Basophils Absolute: 0 10*3/uL (ref 0.0–0.1)
Eosinophils Absolute: 0.2 10*3/uL (ref 0.0–0.7)
Eosinophils Relative: 3 % (ref 0–5)
HCT: 42.4 % (ref 36.0–46.0)
Hemoglobin: 14.1 g/dL (ref 12.0–15.0)
LYMPHS ABS: 2.2 10*3/uL (ref 0.7–4.0)
LYMPHS PCT: 31 % (ref 12–46)
MCH: 28.7 pg (ref 26.0–34.0)
MCHC: 33.3 g/dL (ref 30.0–36.0)
MCV: 86.4 fL (ref 78.0–100.0)
MONOS PCT: 9 % (ref 3–12)
Monocytes Absolute: 0.6 10*3/uL (ref 0.1–1.0)
NEUTROS ABS: 4.1 10*3/uL (ref 1.7–7.7)
NEUTROS PCT: 57 % (ref 43–77)
PLATELETS: 214 10*3/uL (ref 150–400)
RBC: 4.91 MIL/uL (ref 3.87–5.11)
RDW: 14.5 % (ref 11.5–15.5)
WBC: 7.2 10*3/uL (ref 4.0–10.5)

## 2014-10-23 MED ORDER — ATORVASTATIN CALCIUM 10 MG PO TABS: 10.0000 mg | ORAL_TABLET | Freq: Every day | ORAL | Status: DC

## 2014-10-23 MED ORDER — HYDRALAZINE HCL 50 MG PO TABS: 50.0000 mg | ORAL_TABLET | Freq: Three times a day (TID) | ORAL | Status: DC

## 2014-10-23 MED ORDER — CLOPIDOGREL BISULFATE 75 MG PO TABS: 75.0000 mg | ORAL_TABLET | Freq: Every day | ORAL | Status: DC

## 2014-10-23 NOTE — Progress Notes (Addendum)
Subjective:    Patient ID: Brandy Roberts, female    DOB: 04/25/1971, 44 y.o.   MRN: [id number]  HPI  Admit date: 10/06/14 Discharge date: 10/18/14  Brandy Roberts is a 44 year old right-handed female with a history of hypertension who has been noncompliant with medications who had presented to the ED with left-sided weakness and decreased balance and was noted to have a severely elevated blood pressure of 206/87 in the emergency room. MRI of the brain revealed acute lacunar infarcts in the PLIC (right PCA territory), right corona radiata (right MCA territory) and superimposed chronic lacunar infarcts in the left corona radiata and left basal ganglia (left MCA territory). Urine drug screen was positive for THC and opiates. She was commenced on antiplatelet therapy after evaluation by Neurology and recommendation was for dual antiplatelet therapy for 3 months and after that Plavix alone.  She had a 2-D echo which revealed an EF of 40-45%, LVH, diffuse hypokinesis, trivial MR, moderate aortic regurgitation, mildly to moderately dilated left atrium. TEE revealed no vegetation. Moderate to severe aortic regurgitation. Bilateral lower extremity Dopplers negative for DVT. Renal artery doppler was negative for renal artery stenosis. She was later transferred for comprehensive inpatient rehab.  Seen by Cardiology who attributed stroke to small vessel disease and uncontrolled hypertension and recommended close medical follow up..  Interval History: She took a fall 2 days after discharge but did not get hurt. She lives with her son and has a friend who comes over to help; she still stutters, and has weakness of her left side.  States she is yet to pick up 3 of her medications due to cost and her BP is elevated today. She also complains of bruising a lot on her thighs.  Past Medical History  Diagnosis Date  . Hypertension   . Stroke     Past Surgical History  Procedure Laterality Date  .  Cesarean section    . Tonsillectomy    . Ganglion cyst excision    . Tee without cardioversion N/A 10/09/2014    Procedure: TRANSESOPHAGEAL ECHOCARDIOGRAM (TEE);  Surgeon: Sanda Klein, MD;  Location: [facility] ENDOSCOPY;  Service: Cardiovascular;  Laterality: N/A;   Family History  Problem Relation Age of Onset  . Cancer Mother   . Hypertension Mother     No Known Allergies  Current Outpatient Prescriptions on File Prior to Visit  Medication Sig Dispense Refill  . amLODipine (NORVASC) 5 MG tablet Take 1 tablet (5 mg total) by mouth 2 (two) times daily. 60 tablet 1  . aspirin EC 81 MG EC tablet Take 1 tablet (81 mg total) by mouth daily.    . bisacodyl (DULCOLAX) 5 MG EC tablet Take 2 tablets (10 mg total) by mouth daily as needed for moderate constipation. (Patient not taking: Reported on 10/23/2014) 30 tablet 0  . docusate sodium (COLACE) 100 MG capsule Take 1 capsule (100 mg total) by mouth 2 (two) times daily. (Patient not taking: Reported on 10/23/2014) 10 capsule 0  . hydrochlorothiazide (HYDRODIURIL) 25 MG tablet Take 1 tablet (25 mg total) by mouth daily. 30 tablet 1  . lisinopril (PRINIVIL,ZESTRIL) 20 MG tablet Take 1 tablet (20 mg total) by mouth 2 (two) times daily. 60 tablet 1  . metoprolol (LOPRESSOR) 50 MG tablet Take 1 tablet (50 mg total) by mouth 2 (two) times daily. 60 tablet 1  . spironolactone (ALDACTONE) 50 MG tablet Take 1 tablet (50 mg total) by mouth daily. 30 tablet 1  . traMADol (ULTRAM) 50 MG  tablet Take 1 tablet (50 mg total) by mouth every 6 (six) hours as needed (Headache). (Patient not taking: Reported on 10/23/2014) 60 tablet 0   No current facility-administered medications on file prior to visit.     Review of Systems  Constitutional: Negative for activity change, appetite change and fatigue.  HENT: Negative for congestion, sinus pressure and sore throat.   Eyes: Negative for visual disturbance.  Respiratory: Negative for cough, chest tightness, shortness of  breath and wheezing.   Cardiovascular: Negative for chest pain and palpitations.  Gastrointestinal: Negative for abdominal pain, constipation and abdominal distention.  Endocrine: Negative for polydipsia.  Genitourinary: Negative for dysuria and frequency.  Musculoskeletal: Negative for back pain and arthralgias.  Skin:       Bruising on thigh.  Neurological: Positive for speech difficulty and weakness. Negative for tremors, light-headedness and numbness.  Hematological: Does not bruise/bleed easily.  Psychiatric/Behavioral: Negative for behavioral problems and agitation.        Objective: Filed Vitals:   10/23/14 1051  BP: 151/72  Pulse: 56  Temp: 98.1 F (36.7 C)  TempSrc: Oral  Resp: 18  Height: 5\' 6"  (1.676 m)  Weight: 185 lb (83.915 kg)  SpO2: 97%      Physical Exam  Constitutional: She is oriented to person, place, and time. She appears well-developed and well-nourished. No distress.  HENT:  Head: Normocephalic.  Right Ear: External ear normal.  Left Ear: External ear normal.  Nose: Nose normal.  Mouth/Throat: Oropharynx is clear and moist.  Eyes: Conjunctivae and EOM are normal. Pupils are equal, round, and reactive to light.  Neck: Normal range of motion. No JVD present.  Cardiovascular: Normal rate, regular rhythm, normal heart sounds and intact distal pulses.  Exam reveals no gallop.   No murmur heard. Pulmonary/Chest: Effort normal and breath sounds normal. No respiratory distress. She has no wheezes. She has no rales. She exhibits no tenderness.  Abdominal: Soft. Bowel sounds are normal. She exhibits no distension and no mass. There is no tenderness.  Musculoskeletal: Normal range of motion. She exhibits no edema or tenderness.  Neurological: She is alert and oriented to person, place, and time.  Motor strength: LUE and LLE: 3/5 RUE and RLE: 5/5  Skin: She is not diaphoretic.  Two coin lesion bruises on thighs  Psychiatric: She has a normal mood and  affect.      EXAM: MRI HEAD WITHOUT CONTRAST  TECHNIQUE: Multiplanar, multiecho pulse sequences of the brain and surrounding structures were obtained without intravenous contrast.  COMPARISON: Head CT without contrast 1315 hours today.  FINDINGS: There is a 12 mm area of restricted diffusion in the lateral right thalamus near the posterior limb of the right internal capsule. Associated T2 and FLAIR hyperintensity, but no hemorrhage or mass effect.  Also in the right hemisphere at the corona radiata just above the low right lateral ventricle there is a small 6-8 mm focus of restricted diffusion. See series 5, image 18. This level also demonstrates mild T2 and FLAIR hyperintensity with no hemorrhage or mass effect.  In the left corona radiata there is signal abnormality most resembling a chronic lacunar infarct (series 9, image 18). Several other scattered bilateral cerebral white matter T2 and FLAIR hyperintense foci are nonspecific. In the left globus pallidus and anterior limb of the external capsule there are signal changes suggesting chronic lacunar infarcts. There is hemosiderin associated with the former.  The brainstem and cerebellum are within normal limits. Major intracranial vascular flow voids are  within normal limits.  No midline shift, mass effect, evidence of mass lesion, ventriculomegaly, extra-axial collection or acute intracranial hemorrhage. Cervicomedullary junction and pituitary are within normal limits. Negative visualized cervical spine. Visible internal auditory structures appear normal. Trace left mastoid fluid. Negative nasopharynx. Trace paranasal sinus mucosal thickening. Visualized orbit soft tissues are within normal limits. Visualized scalp soft tissues are within normal limits. Visualized bone marrow signal is within normal limits.  IMPRESSION: 1. Small acute lacunar infarcts in the lateral right thalamus/posterior limb right  external capsule (right PCA territory) and also the right corona radiata (right MCA territory). No mass effect or hemorrhage. 2. Superimposed chronic lacunar infarcts in the left corona radiata and left basal ganglia (left MCA territory). 3. In light of the above in the patient's age, consider a source of cardiac or paradoxical emboli, such as PFO. And otherwise consider risk factors for accelerated small vessel disease.   Electronically Signed  By: Genevie Ann M.D.  On: 10/06/2014 20:25        Assessment & Plan:  44 year old female with a history of uncontrolled hypertension,recently hospitalized for stroke in three different territories ( right MCA, right PCA , left MCA)  with residual left hemiparesis and new finding of cardiomyopathy.  Hypertention: Uncontrolled due to missing dose of hydralazine which I have sent to the pharmacy on site and will reassess her blood pressure at her next office visit.  Right middle and posterior cerebral artery stroke with left hemiparesis: Plan is to continue to dual antiplatelets therapy for 3 months and then Plavix alone after that. Plavix sent to pharmacy. She is high risk for falls and I have advised her to use a walker all the time. Advised to keep appointments with rehabilitation and neurology.  Hyperlipidemia: Lipitor sent to pharmacy  Cardiomyopathy: LVEF of 40-45%, LVH,  diffuse hypokinesis moderate to severe aortic regurg. Likely secondary to prolonged uncontrolled Hypertension   Contusion: Secondary to use of Plavix and recent fall. Fall precautions discussed.  Disclaimer: This note was dictated with voice recognition software. Similar sounding words can inadvertently be transcribed and this note may contain transcription errors which may not have been corrected upon publication of note.
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
## 2014-10-23 NOTE — Patient Instructions (Signed)
Ischemic Stroke °A stroke (cerebrovascular accident) is the sudden death of brain tissue. It is a medical emergency. A stroke can cause permanent loss of brain function. This can cause problems with different parts of your body. A transient ischemic attack (TIA) is different because it does not cause permanent damage. A TIA is a short-lived problem of poor blood flow affecting a part of the brain. A TIA is also a serious problem because having a TIA greatly increases the chances of having a stroke. When symptoms first develop, you cannot know if the problem might be a stroke or a TIA. °CAUSES  °A stroke is caused by a decrease of oxygen supply to an area of your brain. It is usually the result of a small blood clot or collection of cholesterol or fat (plaque) that blocks blood flow in the brain. A stroke can also be caused by blocked or damaged carotid arteries.  °RISK FACTORS °· High blood pressure (hypertension). °· High cholesterol. °· Diabetes mellitus. °· Heart disease. °· The buildup of plaque in the blood vessels (peripheral artery disease or atherosclerosis). °· The buildup of plaque in the blood vessels providing blood and oxygen to the brain (carotid artery stenosis). °· An abnormal heart rhythm (atrial fibrillation). °· Obesity. °· Smoking. °· Taking oral contraceptives (especially in combination with smoking). °· Physical inactivity. °· A diet high in fats, salt (sodium), and calories. °· Alcohol use. °· Use of illegal drugs (especially cocaine and methamphetamine). °· Being African American. °· Being over the age of 55. °· Family history of stroke. °· Previous history of blood clots, stroke, TIA, or heart attack. °· Sickle cell disease. °SYMPTOMS  °These symptoms usually develop suddenly, or may be newly present upon awakening from sleep: °· Sudden weakness or numbness of the face, arm, or leg, especially on one side of the body. °· Sudden trouble walking or difficulty moving arms or legs. °· Sudden  confusion. °· Sudden personality changes. °· Trouble speaking (aphasia) or understanding. °· Difficulty swallowing. °· Sudden trouble seeing in one or both eyes. °· Double vision. °· Dizziness. °· Loss of balance or coordination. °· Sudden severe headache with no known cause. °· Trouble reading or writing. °DIAGNOSIS  °Your health care provider can often determine the presence or absence of a stroke based on your symptoms, history, and physical exam. Computed tomography (CT) of the brain is usually performed to confirm the stroke, determine causes, and determine stroke severity. Other tests may be done to find the cause of the stroke. These tests may include: °· Electrocardiography. °· Continuous heart monitoring. °· Echocardiography. °· Carotid ultrasonography. °· Magnetic resonance imaging (MRI). °· A scan of the brain circulation. °· Blood tests. °PREVENTION  °The risk of a stroke can be decreased by appropriately treating high blood pressure, high cholesterol, diabetes, heart disease, and obesity and by quitting smoking, limiting alcohol, and staying physically active. °TREATMENT  °Time is of the essence. It is important to seek treatment at the first sign of these symptoms because you may receive a medicine to dissolve the clot (thrombolytic) that cannot be given if too much time has passed since your symptoms began. Even if you do not know when your symptoms began, get treatment as soon as possible as there are other treatment options available including oxygen, intravenous (IV) fluids, and medicines to thin the blood (anticoagulants). Treatment of stroke depends on the duration, severity, and cause of your symptoms. Medicines and dietary changes may be used to address diabetes, high blood   pressure, and other risk factors. Physical, speech, and occupational therapists will assess you and work with you to improve any functions impaired by the stroke. Measures will be taken to prevent short-term and long-term  complications, including infection from breathing foreign material into the lungs (aspiration pneumonia), blood clots in the legs, bedsores, and falls. Rarely, surgery may be needed to remove large blood clots or to open up blocked arteries. °HOME CARE INSTRUCTIONS  °· Take medicines only as directed by your health care provider. Follow the directions carefully. Medicines may be used to control risk factors for a stroke. Be sure you understand all your medicine instructions. °· You may be told to take a medicine to thin the blood, such as aspirin or the anticoagulant warfarin. Warfarin needs to be taken exactly as instructed. °¨ Too much and too little warfarin are both dangerous. Too much warfarin increases the risk of bleeding. Too little warfarin continues to allow the risk for blood clots. While taking warfarin, you will need to have regular blood tests to measure your blood clotting time. These blood tests usually include both the PT and INR tests. The PT and INR results allow your health care provider to adjust your dose of warfarin. The dose can change for many reasons. It is critically important that you take warfarin exactly as prescribed, and that you have your PT and INR levels drawn exactly as directed. °¨ Many foods, especially foods high in vitamin K, can interfere with warfarin and affect the PT and INR results. Foods high in vitamin K include spinach, kale, broccoli, cabbage, collard and turnip greens, brussels sprouts, peas, cauliflower, seaweed, and parsley, as well as beef and pork liver, green tea, and soybean oil. You should eat a consistent amount of foods high in vitamin K. Avoid major changes in your diet, or notify your health care provider before changing your diet. Arrange a visit with a dietitian to answer your questions. °¨ Many medicines can interfere with warfarin and affect the PT and INR results. You must tell your health care provider about any and all medicines you take. This  includes all vitamins and supplements. Be especially cautious with aspirin and anti-inflammatory medicines. Do not take or discontinue any prescribed or over-the-counter medicine except on the advice of your health care provider or pharmacist. °¨ Warfarin can have side effects, such as excessive bruising or bleeding. You will need to hold pressure over cuts for longer than usual. Your health care provider or pharmacist will discuss other potential side effects. °¨ Avoid sports or activities that may cause injury or bleeding. °¨ Be mindful when shaving, flossing your teeth, or handling sharp objects. °¨ Alcohol can change the body's ability to handle warfarin. It is best to avoid alcoholic drinks or consume only very small amounts while taking warfarin. Notify your health care provider if you change your alcohol intake. °¨ Notify your dentist or other health care providers before procedures. °· If swallow studies have determined that your swallowing reflex is present, you should eat healthy foods. Including 5 or more servings of fruits and vegetables a day may reduce the risk of stroke. Foods may need to be a certain consistency (soft or pureed), or small bites may need to be taken in order to avoid aspirating or choking. Certain dietary changes may be advised to address high blood pressure, high cholesterol, diabetes, or obesity. °¨ Food choices that are low in sodium, saturated fat, trans fat, and cholesterol are recommended to manage high blood pressure. °¨   Food choies that are high in fiber, and low in saturated fat, trans fat, and cholesterol may control cholesterol levels. °¨ Controlling carbohydrates and sugar intake is recommended to manage diabetes. °¨ Reducing calorie intake and making food choices that are low in sodium, saturated fat, trans fat, and cholesterol are recommended to manage obesity. °· Maintain a healthy weight. °· Stay physically active. It is recommended that you get at least 30 minutes of  activity on all or most days. °· Do not use any tobacco products including cigarettes, chewing tobacco, or electronic cigarettes. °· Limit alcohol use even if you are not taking warfarin. Moderate alcohol use is considered to be: °¨ No more than 2 drinks each day for men. °¨ No more than 1 drink each day for nonpregnant women. °· Home safety. A safe home environment is important to reduce the risk of falls. Your health care provider may arrange for specialists to evaluate your home. Having grab bars in the bedroom and bathroom is often important. Your health care provider may arrange for equipment to be used at home, such as raised toilets and a seat for the shower. °· Physical, occupational, and speech therapy. Ongoing therapy may be needed to maximize your recovery after a stroke. If you have been advised to use a walker or a cane, use it at all times. Be sure to keep your therapy appointments. °· Follow all instructions for follow-up with your health care provider. This is very important. This includes any referrals, physical therapy, rehabilitation, and lab tests. Proper follow-up can prevent another stroke from occurring. °SEEK MEDICAL CARE IF: °· You have personality changes. °· You have difficulty swallowing. °· You are seeing double. °· You have dizziness. °· You have a fever. °· You have skin breakdown. °SEEK IMMEDIATE MEDICAL CARE IF:  °Any of these symptoms may represent a serious problem that is an emergency. Do not wait to see if the symptoms will go away. Get medical help right away. Call your local emergency services (911 in [place] Do not drive yourself to the hospital. °· You have sudden weakness or numbness of the face, arm, or leg, especially on one side of the body. °· You have sudden trouble walking or difficulty moving arms or legs. °· You have sudden confusion. °· You have trouble speaking (aphasia) or understanding. °· You have sudden trouble seeing in one or both eyes. °· You have a loss of  balance or coordination. °· You have a sudden, severe headache with no known cause. °· You have new chest pain or an irregular heartbeat. °· You have a partial or total loss of consciousness. °Document Released: 05/19/2005 Document Revised: 10/03/2013 Document Reviewed: 12/28/2011 °[employer]® Patient Information ©2015 [employer]. This information is not intended to replace advice given to you by your health care provider. Make sure you discuss any questions you have with your health care provider. ° °

## 2014-10-23 NOTE — Progress Notes (Signed)
Patient hospitalized several weeks ago, patient had a couple strokes. Patient feels "pretty good". Patient reports not being able to get some medications due to price. Patient denies pain at this time. Patient has 2-3 prescriptions she has not picked up yet, unsure of which ones. Patient would like nicotine patches to help with quitting smoking.
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
## 2014-10-23 NOTE — Discharge Instructions (Signed)
CT scan showed no acute findings. Rest in quiet dark room. Take your blood pressure medication as prescribed. Return if worse in anyway. Follow-up your primary care doctor.

## 2014-10-23 NOTE — ED Notes (Signed)
Patient transported to CT 

## 2014-10-23 NOTE — ED Notes (Signed)
Family at bedside. 

## 2014-10-31 ENCOUNTER — Ambulatory Visit: Payer: No Typology Code available for payment source | Attending: Family Medicine | Admitting: Family Medicine

## 2014-10-31 ENCOUNTER — Encounter: Payer: Self-pay | Admitting: Family Medicine

## 2014-10-31 VITALS — BP 134/67 | HR 63 | Temp 98.3°F | Resp 18 | Ht 66.0 in | Wt 184.0 lb

## 2014-10-31 DIAGNOSIS — G8190 Hemiplegia, unspecified affecting unspecified side: Secondary | ICD-10-CM | POA: Diagnosis not present

## 2014-10-31 DIAGNOSIS — E785 Hyperlipidemia, unspecified: Secondary | ICD-10-CM | POA: Diagnosis not present

## 2014-10-31 DIAGNOSIS — I63411 Cerebral infarction due to embolism of right middle cerebral artery: Secondary | ICD-10-CM | POA: Diagnosis not present

## 2014-10-31 DIAGNOSIS — G8194 Hemiplegia, unspecified affecting left nondominant side: Secondary | ICD-10-CM

## 2014-10-31 DIAGNOSIS — I351 Nonrheumatic aortic (valve) insufficiency: Secondary | ICD-10-CM | POA: Diagnosis not present

## 2014-10-31 DIAGNOSIS — I429 Cardiomyopathy, unspecified: Secondary | ICD-10-CM | POA: Diagnosis not present

## 2014-10-31 DIAGNOSIS — I119 Hypertensive heart disease without heart failure: Secondary | ICD-10-CM | POA: Diagnosis not present

## 2014-10-31 DIAGNOSIS — I43 Cardiomyopathy in diseases classified elsewhere: Secondary | ICD-10-CM

## 2014-10-31 DIAGNOSIS — I69354 Hemiplegia and hemiparesis following cerebral infarction affecting left non-dominant side: Secondary | ICD-10-CM | POA: Diagnosis not present

## 2014-10-31 DIAGNOSIS — I63431 Cerebral infarction due to embolism of right posterior cerebral artery: Secondary | ICD-10-CM

## 2014-10-31 DIAGNOSIS — I10 Essential (primary) hypertension: Secondary | ICD-10-CM

## 2014-10-31 MED ORDER — METOPROLOL TARTRATE 50 MG PO TABS: 50.0000 mg | ORAL_TABLET | Freq: Two times a day (BID) | ORAL | Status: DC

## 2014-10-31 MED ORDER — HYDROCHLOROTHIAZIDE 25 MG PO TABS: 25.0000 mg | ORAL_TABLET | Freq: Every day | ORAL | Status: DC

## 2014-10-31 MED ORDER — ASPIRIN 81 MG PO TBEC: 81.0000 mg | DELAYED_RELEASE_TABLET | Freq: Every day | ORAL | Status: DC

## 2014-10-31 MED ORDER — SPIRONOLACTONE 50 MG PO TABS: 50.0000 mg | ORAL_TABLET | Freq: Every day | ORAL | Status: DC

## 2014-10-31 MED ORDER — HYDRALAZINE HCL 50 MG PO TABS: 50.0000 mg | ORAL_TABLET | Freq: Three times a day (TID) | ORAL | Status: DC

## 2014-10-31 MED ORDER — CLOPIDOGREL BISULFATE 75 MG PO TABS: 75.0000 mg | ORAL_TABLET | Freq: Every day | ORAL | Status: DC

## 2014-10-31 MED ORDER — LISINOPRIL 20 MG PO TABS: 20.0000 mg | ORAL_TABLET | Freq: Two times a day (BID) | ORAL | Status: DC

## 2014-10-31 MED ORDER — AMLODIPINE BESYLATE 10 MG PO TABS: 10.0000 mg | ORAL_TABLET | Freq: Every day | ORAL | Status: DC

## 2014-10-31 MED ORDER — ATORVASTATIN CALCIUM 10 MG PO TABS: 10.0000 mg | ORAL_TABLET | Freq: Every day | ORAL | Status: DC

## 2014-10-31 NOTE — Patient Instructions (Signed)

## 2014-10-31 NOTE — Progress Notes (Signed)
Patient reports feeling better. Patient has been taking medications as prescribed. Needs refills on amlodipine, lisinopril, hctz, spirinolactone. Meds have 1 refill at [facility] but patient wants meds refilled here. Patient needs aspirin 81mg , has not been taking. Patient denies pain at this time.
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
## 2014-11-21 ENCOUNTER — Telehealth: Payer: Self-pay | Admitting: Clinical

## 2014-11-21 NOTE — Telephone Encounter (Signed)
No voicemail available, attempt at f/u

## 2014-11-23 ENCOUNTER — Ambulatory Visit (HOSPITAL_BASED_OUTPATIENT_CLINIC_OR_DEPARTMENT_OTHER): Payer: No Typology Code available for payment source | Admitting: Physical Medicine & Rehabilitation

## 2014-11-23 ENCOUNTER — Encounter: Payer: No Typology Code available for payment source | Attending: Physical Medicine & Rehabilitation

## 2014-11-23 ENCOUNTER — Encounter: Payer: Self-pay | Admitting: Physical Medicine & Rehabilitation

## 2014-11-23 VITALS — BP 148/72 | HR 88 | Resp 14

## 2014-11-23 DIAGNOSIS — I6931 Cognitive deficits following cerebral infarction: Secondary | ICD-10-CM | POA: Insufficient documentation

## 2014-11-23 DIAGNOSIS — I63431 Cerebral infarction due to embolism of right posterior cerebral artery: Secondary | ICD-10-CM

## 2014-11-23 DIAGNOSIS — I10 Essential (primary) hypertension: Secondary | ICD-10-CM | POA: Diagnosis not present

## 2014-11-23 DIAGNOSIS — I69319 Unspecified symptoms and signs involving cognitive functions following cerebral infarction: Secondary | ICD-10-CM | POA: Insufficient documentation

## 2014-11-23 DIAGNOSIS — R270 Ataxia, unspecified: Secondary | ICD-10-CM | POA: Diagnosis not present

## 2014-11-23 DIAGNOSIS — I69993 Ataxia following unspecified cerebrovascular disease: Secondary | ICD-10-CM

## 2014-11-23 NOTE — Progress Notes (Signed)
Subjective:    Patient ID: Brandy Roberts, female    DOB: 05/16/1971, 44 y.o.   MRN: [id number] 44 year old right-handed female with history of hypertension, tobacco abuse, independent prior to admission living with her son, working full time. Admitted Oct 06, 2014, with decreased balance, left-sided weakness. Blood pressure 196/88. Urine drug screen positive for marijuana. MRI of the brain showed acute lacunar infarct, lateral right thalamus, posterior limb of right external capsule, and also right corona radiata. Echocardiogram with ejection fraction of 02% grade 1 diastolic dysfunction. CT angiogram of head and neck with no acute findings. The patient did not receive tPA. Venous Doppler studies of lower extremities negative. Maintained on aspirin and Plavix therapy per Neurology Services. TEE completed showing moderate-to-severe eccentric aortic regurgitation, no evidence of endocarditis. HPI No therapy since d/c from rehab Patient feels like she tires out very easily has poor endurance some shortness of breath with activities, No chest pain Fell at home twice without injury 2 PCP follow ups already,  Has cardiology f/u July 5,Dr. Wynonia Lawman Pain Inventory Average Pain 6 Pain Right Now 3 My pain is intermittent, dull and aching  In the last 24 hours, has pain interfered with the following? General activity 3 Relation with others 3 Enjoyment of life 3 What TIME of day is your pain at its worst? morning Sleep (in general) Fair  Pain is worse with: walking, standing and some activites Pain improves with: rest and medication Relief from Meds: 5  Mobility use a walker how many minutes can you walk? 10 ability to climb steps?  no do you drive?  no  Function disabled: date disabled .  Neuro/Psych weakness numbness trouble walking confusion depression anxiety  Prior Studies Any changes since last visit?  no  Physicians involved in your care Any changes  since last visit?  no   Family History  Problem Relation Age of Onset  . Cancer Mother   . Hypertension Mother   . Hypertension Sister    History   Social History  . Marital Status: Single    Spouse Name: N/A  . Number of Children: N/A  . Years of Education: N/A   Social History Main Topics  . Smoking status: Former Smoker    Quit date: 11/01/2014  . Smokeless tobacco: Never Used  . Alcohol Use: No  . Drug Use: Not on file  . Sexual Activity: Not on file   Other Topics Concern  . 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 Cesarean section    . Tonsillectomy    . Ganglion cyst excision    . Tee without cardioversion N/A 10/09/2014    Procedure: TRANSESOPHAGEAL ECHOCARDIOGRAM (TEE);  Surgeon: Sanda Klein, MD;  Location: [facility] ENDOSCOPY;  Service: Cardiovascular;  Laterality: N/A;   Past Medical History  Diagnosis Date  . Hypertension   . Stroke   . Hyperlipidemia    BP 148/72 mmHg  Pulse 88  Resp 14  SpO2 100%  Opioid Risk Score: 0 Fall Risk Score: Moderate Fall Risk (6-13 points)`1  Depression screen PHQ 2/9  Depression screen [facility] 2/9 11/23/2014 10/31/2014 10/23/2014  Decreased Interest 2 0 2  Down, Depressed, Hopeless 3 0 1  PHQ - 2 Score 5 0 3  Altered sleeping 2 - 3  Tired, decreased energy 3 - 3  Change in appetite 1 - 1  Feeling bad or failure about yourself  0 - 2  Trouble concentrating 2 - 0  Moving slowly or fidgety/restless 0 - 1  Suicidal thoughts 0 - 0  PHQ-9 Score 13 - 13     Review of Systems  HENT: Negative.   Eyes: Negative.   Respiratory: Negative.   Cardiovascular: Negative.   Gastrointestinal: Positive for nausea.  Endocrine: Negative.   Genitourinary: Negative.   Musculoskeletal: Positive for myalgias and arthralgias.  Skin: Negative.   Allergic/Immunologic: Negative.   Neurological: Positive for weakness and numbness.       Trouble  walking  Hematological: Negative.   Psychiatric/Behavioral: Positive for dysphoric mood and decreased concentration. The patient is nervous/anxious.        Objective:   Physical Exam  Constitutional: She is oriented to person, place, and time. She appears well-developed and well-nourished.  HENT:  Head: Normocephalic and atraumatic.  Eyes: Conjunctivae and EOM are normal. Pupils are equal, round, and reactive to light.  Neck: Normal range of motion. Neck supple.  Cardiovascular: Normal rate and regular rhythm.   Pulmonary/Chest: Effort normal and breath sounds normal.  Abdominal: Soft. Bowel sounds are normal.  Neurological: She is alert and oriented to person, place, and time. She displays no atrophy. A sensory deficit is present. She exhibits normal muscle tone. Coordination abnormal.  Moderate ataxia left finger-nose-finger Severe ataxia left heel-to-shin Sensory ataxia noted  Decreased sensation in the left hand excluding the thumb, light touch Absent light touch left lower extremity  Intact light touch right upper and right lower extremity  Motor strength is 5/5 in the right deltoid, biceps, triceps, grip, hip flexor, knee extensor, ankle dorsal flexor plantar flexor 4/5 and left deltoid, biceps, triceps, grip, hip flexor, knee extensor 2 minus left ankle dorsiflexor and plantar flexor  Gait is using a rolling walker as well as a left AFO  Psychiatric: She has a normal mood and affect.  Nursing note and vitals reviewed.         Assessment & Plan:  1. Right thalamic right posterior limb of internal capsule as well as right corona radiata infarct. Main residual findings include sensory hemiataxia on the left side as well as cognitive deficits. This is impacted her mobility as well as her ADLs as well as her memory. Recommend and will refer for outpatient PT, OT, and speech therapy Patient will follow up with primary care physician as well as neurology as well as  cardiology 2. Cardiomyopathy will follow up with Dr. Wynonia Lawman, this is likely the cause of her poor endurance in combination with her stroke, I do not think there needs to be any restrictions for her outpatient rehabilitation. She did tolerate inpatient rehabilitation other than having poor endurance.

## 2014-11-23 NOTE — Patient Instructions (Signed)
Therapy will call you to set up appointments for speech, OT, PT  See me back in 1 month

## 2014-11-30 ENCOUNTER — Encounter: Payer: Self-pay | Admitting: Internal Medicine

## 2014-11-30 ENCOUNTER — Ambulatory Visit: Payer: No Typology Code available for payment source | Attending: Internal Medicine | Admitting: Internal Medicine

## 2014-11-30 VITALS — BP 119/66 | HR 62 | Temp 98.0°F | Resp 16 | Wt 188.2 lb

## 2014-11-30 DIAGNOSIS — G8190 Hemiplegia, unspecified affecting unspecified side: Secondary | ICD-10-CM

## 2014-11-30 DIAGNOSIS — F329 Major depressive disorder, single episode, unspecified: Secondary | ICD-10-CM

## 2014-11-30 DIAGNOSIS — R05 Cough: Secondary | ICD-10-CM

## 2014-11-30 DIAGNOSIS — I10 Essential (primary) hypertension: Secondary | ICD-10-CM

## 2014-11-30 DIAGNOSIS — R4589 Other symptoms and signs involving emotional state: Secondary | ICD-10-CM

## 2014-11-30 DIAGNOSIS — R059 Cough, unspecified: Secondary | ICD-10-CM

## 2014-11-30 DIAGNOSIS — G8194 Hemiplegia, unspecified affecting left nondominant side: Secondary | ICD-10-CM

## 2014-11-30 DIAGNOSIS — E785 Hyperlipidemia, unspecified: Secondary | ICD-10-CM

## 2014-11-30 NOTE — Progress Notes (Signed)
  New patient here to established care. Pt would like to discuss her Hypertension.

## 2014-11-30 NOTE — Patient Instructions (Signed)
Remember to tell Cardiology (Dr. Wynonia Lawman) about cough. He will likely switch you to Losartan. If not call me back and I will make teh switch for you.

## 2014-11-30 NOTE — Progress Notes (Signed)
Patient ID: Brandy Roberts, female   DOB: [birth year], 44 y.o.   MRN: [id number]  CC: follow up  HPI: Brandy Roberts is a 44 year old right-handed female with a history of hypertension who has been noncompliant with medications who had presented to the ED with left-sided weakness and decreased balance and was noted to have a severely elevated blood pressure of 206/87 in the emergency room. MRI of the brain revealed acute lacunar infarcts in the PLIC (right PCA territory), right corona radiata (right MCA territory) and superimposed chronic lacunar infarcts in the left corona radiata and left basal ganglia (left MCA territory). Urine drug screen was positive for THC and opiates. She was commenced on antiplatelet therapy after evaluation by Neurology and recommendation was for dual antiplatelet therapy for 3 months and after that Plavix alone.  Patient reports that since our last visit she has had one physical therapy meeting with Dr. Dianna Limbo. She has a follow up with Cardiology and Neurology. She is still on Plavix and aspirin. She has left sided weakness that keeps her from performing her daily activities. She is right hand dominant. Due to her lack of mobility she has feelings of depression.   Patient has No headache, No chest pain, No abdominal pain - No Nausea, No new weakness tingling or numbness, No Cough - SOB.  No Known Allergies Past Medical History  Diagnosis Date  . Hypertension   . Stroke   . Hyperlipidemia    Current Outpatient Prescriptions on File Prior to Visit  Medication Sig Dispense Refill  . amLODipine (NORVASC) 10 MG tablet Take 1 tablet (10 mg total) by mouth daily. 30 tablet 3  . aspirin 81 MG EC tablet Take 1 tablet (81 mg total) by mouth daily. 30 tablet 2  . atorvastatin (LIPITOR) 10 MG tablet Take 1 tablet (10 mg total) by mouth daily at 6 PM. 30 tablet 2  . clopidogrel (PLAVIX) 75 MG tablet Take 1 tablet (75 mg total) by mouth daily. 30 tablet 3  . hydrALAZINE  (APRESOLINE) 50 MG tablet Take 1 tablet (50 mg total) by mouth 3 (three) times daily with meals. 90 tablet 2  . hydrochlorothiazide (HYDRODIURIL) 25 MG tablet Take 1 tablet (25 mg total) by mouth daily. 30 tablet 3  . lisinopril (PRINIVIL,ZESTRIL) 20 MG tablet Take 1 tablet (20 mg total) by mouth 2 (two) times daily. 60 tablet 3  . metoprolol (LOPRESSOR) 50 MG tablet Take 1 tablet (50 mg total) by mouth 2 (two) times daily. 60 tablet 3  . spironolactone (ALDACTONE) 50 MG tablet Take 1 tablet (50 mg total) by mouth daily. 30 tablet 3  . traMADol (ULTRAM) 50 MG tablet Take 1 tablet (50 mg total) by mouth every 6 (six) hours as needed (Headache). 60 tablet 0   No current facility-administered medications on file prior to visit.   Family History  Problem Relation Age of Onset  . Cancer Mother   . Hypertension Mother   . Hypertension Sister    History   Social History  . Marital Status: Single    Spouse Name: N/A  . Number of Children: N/A  . Years of Education: N/A   Occupational History  . Not on file.   Social History Main Topics  . Smoking status: Former Smoker    Quit date: 11/01/2014  . Smokeless tobacco: Never Used  . Alcohol Use: No  . Drug Use: Not on file  . Sexual Activity: Not on file   Other Topics Concern  . 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  Cardiovascular: Normal rate, regular rhythm and normal heart sounds.   Pulmonary/Chest: Effort normal and breath sounds normal.  Musculoskeletal:  Right UE/LE both 5/5 strength  Left UE/LE both 3/5 strength Needs requires walker for mobility   Neurological: She is alert and oriented to person, place, and time.  Skin: Skin is warm and dry.    Lab  Results  Component Value Date   WBC 7.2 10/23/2014   HGB 14.1 10/23/2014   HCT 42.4 10/23/2014   MCV 86.4 10/23/2014   PLT 214 10/23/2014   Lab Results  Component Value Date   CREATININE 1.01* 10/23/2014   BUN 26* 10/23/2014   NA 135 10/23/2014   K 3.4* 10/23/2014   CL 102 10/23/2014   CO2 23 10/23/2014    Lab Results  Component Value Date   HGBA1C 5.8* 10/07/2014   Lipid Panel     Component Value Date/Time   CHOL 179 10/07/2014 0613   TRIG 105 10/07/2014 0613   HDL 45 10/07/2014 0613   CHOLHDL 4.0 10/07/2014 0613   VLDL 21 10/07/2014 0613   LDLCALC 113* 10/07/2014 0613       Assessment and plan:  Brandy Roberts was seen today for new patient.  Diagnoses and all orders for this visit:  Malignant hypertension Continue on current therapy. She is well controlled now.   Depressed mood As a result of decreased mobility and residual from prior stroke. I will defer treatment with medication today. I have went over some ways to help improve mood. She will try this for the next 2-3 months and we will revisit later. I would like her to have more PT first to see if mobility/strength improves.    Left hemiparesis Continue PT  Hyperlipidemia Continue Lipitor. Will recheck lipid in December.   Cough Due to Lisinopril. I have offered to switch patient to Losartan but she would like to wait until she is able to see Cardiology first.    Return in about 3 months (around 03/02/2015) for Hypertension.       Chari Manning, NP-C [facility] [phone] 11/30/2014, 3:34 PM

## 2014-12-22 ENCOUNTER — Encounter: Payer: No Typology Code available for payment source | Attending: Physical Medicine & Rehabilitation

## 2014-12-22 ENCOUNTER — Encounter: Payer: Self-pay | Admitting: Physical Medicine & Rehabilitation

## 2014-12-22 ENCOUNTER — Ambulatory Visit (HOSPITAL_BASED_OUTPATIENT_CLINIC_OR_DEPARTMENT_OTHER): Payer: No Typology Code available for payment source | Admitting: Physical Medicine & Rehabilitation

## 2014-12-22 VITALS — BP 114/62 | HR 52 | Resp 14

## 2014-12-22 DIAGNOSIS — R270 Ataxia, unspecified: Secondary | ICD-10-CM | POA: Diagnosis not present

## 2014-12-22 DIAGNOSIS — R208 Other disturbances of skin sensation: Secondary | ICD-10-CM | POA: Diagnosis not present

## 2014-12-22 DIAGNOSIS — I63431 Cerebral infarction due to embolism of right posterior cerebral artery: Secondary | ICD-10-CM | POA: Diagnosis not present

## 2014-12-22 DIAGNOSIS — I6931 Cognitive deficits following cerebral infarction: Secondary | ICD-10-CM | POA: Insufficient documentation

## 2014-12-22 DIAGNOSIS — I69398 Other sequelae of cerebral infarction: Secondary | ICD-10-CM | POA: Insufficient documentation

## 2014-12-22 DIAGNOSIS — G8194 Hemiplegia, unspecified affecting left nondominant side: Secondary | ICD-10-CM

## 2014-12-22 DIAGNOSIS — G8190 Hemiplegia, unspecified affecting unspecified side: Secondary | ICD-10-CM

## 2014-12-22 DIAGNOSIS — I69898 Other sequelae of other cerebrovascular disease: Secondary | ICD-10-CM

## 2014-12-22 DIAGNOSIS — I10 Essential (primary) hypertension: Secondary | ICD-10-CM | POA: Diagnosis not present

## 2014-12-22 DIAGNOSIS — R209 Unspecified disturbances of skin sensation: Secondary | ICD-10-CM

## 2014-12-22 DIAGNOSIS — I69319 Unspecified symptoms and signs involving cognitive functions following cerebral infarction: Secondary | ICD-10-CM

## 2014-12-22 NOTE — Progress Notes (Signed)
Subjective:    Patient ID: Brandy Roberts, female    DOB: September 29, 1970, 44 y.o.   MRN: [id number] 44 year old right-handed female with history of hypertension, tobacco abuse, independent prior to admission living with her son, working full time. Admitted Oct 06, 2014, with decreased balance, left-sided weakness. Blood pressure 196/88. Urine drug screen positive for marijuana. MRI of the brain showed acute lacunar infarct, lateral right thalamus, posterior limb of right external capsule, and also right corona radiata. Echocardiogram with ejection fraction of 31% grade 1 diastolic dysfunction. CT angiogram of head and neck with no acute findings. The patient did not receive tPA. Venous Doppler studies of lower extremities negative. Maintained on aspirin and Plavix therapy per Neurology Services. TEE completed showing moderate-to-severe eccentric aortic regurgitation, no evidence of endocarditis. HPI  LLE Burning pain, no UE or facial discomfort Patient thinks the Plavix is giving her a cough and she is not able to tolerate it. Also having a lot of anxiety No falls  Patient now aware that she is having some cognitive deficits related to her stroke. Pain Inventory Average Pain 5 Pain Right Now 5 My pain is intermittent, tingling and aching  In the last 24 hours, has pain interfered with the following? General activity 5 Relation with others 5 Enjoyment of life 6 What TIME of day is your pain at its worst? night Sleep (in general) Fair  Pain is worse with: walking, standing and some activites Pain improves with: medication Relief from Meds: 5  Mobility use a walker how many minutes can you walk? 5 ability to climb steps?  yes do you drive?  no  Function disabled: date disabled .  Neuro/Psych numbness tingling trouble walking anxiety  Prior Studies Any changes since last visit?  no  Physicians involved in your care Any changes since last visit?   no   Family History  Problem Relation Age of Onset  . Cancer Mother   . Hypertension Mother   . Hypertension Sister    History   Social History  . Marital Status: Single    Spouse Name: N/A  . Number of Children: N/A  . Years of Education: N/A   Social History Main Topics  . Smoking status: Former Smoker    Quit date: 11/01/2014  . Smokeless tobacco: Never Used  . Alcohol Use: No  . Drug Use: Not on file  . Sexual Activity: Not on file   Other Topics Concern  . 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 Cesarean section    . Tonsillectomy    . Ganglion cyst excision    . Tee without cardioversion N/A 10/09/2014    Procedure: TRANSESOPHAGEAL ECHOCARDIOGRAM (TEE);  Surgeon: Sanda Klein, MD;  Location: [facility] ENDOSCOPY;  Service: Cardiovascular;  Laterality: N/A;   Past Medical History  Diagnosis Date  . Hypertension   . Stroke   . Hyperlipidemia    BP 114/62 mmHg  Pulse 52  Resp 14  SpO2 100%  Opioid Risk Score:   Fall Risk Score:  `1  Depression screen PHQ 2/9  Depression screen [facility] 2/9 11/30/2014 11/23/2014 10/31/2014 10/23/2014  Decreased Interest 3 2 0 2  Down, Depressed, Hopeless - 3 0 1  PHQ - 2 Score 3 5 0 3  Altered sleeping 3 2 - 3  Tired, decreased energy 3 3 - 3  Change in appetite - 1 - 1  Feeling bad or failure about yourself  0 0 - 2  Trouble concentrating 1 2 - 0  Moving slowly or fidgety/restless - 0 - 1  Suicidal thoughts - 0 - 0  PHQ-9 Score 10 13 - 13     Review of Systems  Constitutional: Negative.   HENT: Negative.   Eyes: Negative.   Respiratory: Negative.   Cardiovascular: Negative.   Gastrointestinal: Negative.   Endocrine: Negative.   Genitourinary: Negative.   Musculoskeletal: Positive for myalgias and arthralgias.       Left leg pain  Skin: Negative.   Allergic/Immunologic: Negative.   Neurological:       Trouble walking,  tingling  Hematological: Negative.   Psychiatric/Behavioral: The patient is nervous/anxious.        Objective:   Physical Exam  Constitutional: She appears well-developed and well-nourished.  HENT:  Head: Normocephalic and atraumatic.  Musculoskeletal:       Right shoulder: Normal.       Left shoulder: She exhibits decreased strength. She exhibits normal range of motion and no deformity.       Right elbow: Normal.      Left elbow: She exhibits normal range of motion and no swelling.       Right wrist: Normal.       Left wrist: She exhibits normal range of motion and no tenderness.       Right hip: Normal.       Left hip: Normal.       Right knee: Normal.       Left knee: Normal.       Right ankle: Normal.       Left ankle: Normal.       Left hand: She exhibits disruption of two-point discrimination. She exhibits no deformity. Decreased sensation noted. Decreased sensation is present in the ulnar distribution, is present in the medial distribution and is present in the radial distribution. Decreased strength noted. She exhibits finger abduction and wrist extension trouble.       Right upper leg: Normal.       Right lower leg: Normal.       Right foot: Normal.  Neurological: She is alert.  Psychiatric: She has a normal mood and affect.  Nursing note and vitals reviewed.  Oriented to person, MD office but not time Has difficulty with directions within the office. Decreased sensation to light touch in the left upper as well as left lower extremity.decreased LT left side of face Motor strength is 3/5 in the left hip flexor and knee extensor ankle dorsal flexor plantar flexor 4/5 in the left deltoid, biceps, triceps, grip  5/5 in the right upper and right lower extremity  Ambulates with a rolling walker, mild toe drag on the left side       Assessment & Plan:  1.  Right thalamic and subcortical infarct causing left hemi sensory loss as well as left lower extremity greater than  left upper paresis The patient would benefit from additional PT and OT. Has outpatient visit scheduled for next week. She could also benefit from outpatient speech and language pathology for her cognitive deficits.  Needs to follow up with primary care to manage medical comorbidities for stroke prevention Follow-up with neurology for secondary stroke prevention,  I asked the patient discussed Plavix use.  I didn't think that the Plavix was responsible for her cough. More likely her Zestril  2. Left lower extremity pain, this appears to be related to her sensory deficits  in her left lower extremity and may in fact be a sign of some improvement of sensory deficits as it started about 2 weeks ago. At this point I do not think we need to treat with gabapentin, if this worsens we may need to consider.

## 2014-12-25 ENCOUNTER — Ambulatory Visit: Payer: No Typology Code available for payment source | Admitting: Physical Therapy

## 2014-12-25 ENCOUNTER — Ambulatory Visit: Payer: No Typology Code available for payment source | Admitting: Occupational Therapy

## 2014-12-25 ENCOUNTER — Encounter: Payer: Self-pay | Admitting: Occupational Therapy

## 2014-12-25 ENCOUNTER — Ambulatory Visit
Payer: No Typology Code available for payment source | Attending: Physical Medicine & Rehabilitation | Admitting: Occupational Therapy

## 2014-12-25 DIAGNOSIS — R29898 Other symptoms and signs involving the musculoskeletal system: Secondary | ICD-10-CM | POA: Insufficient documentation

## 2014-12-25 DIAGNOSIS — I69859 Hemiplegia and hemiparesis following other cerebrovascular disease affecting unspecified side: Secondary | ICD-10-CM | POA: Diagnosis present

## 2014-12-25 DIAGNOSIS — R270 Ataxia, unspecified: Secondary | ICD-10-CM | POA: Insufficient documentation

## 2014-12-25 DIAGNOSIS — I69359 Hemiplegia and hemiparesis following cerebral infarction affecting unspecified side: Secondary | ICD-10-CM

## 2014-12-25 DIAGNOSIS — R269 Unspecified abnormalities of gait and mobility: Secondary | ICD-10-CM

## 2014-12-25 DIAGNOSIS — R4189 Other symptoms and signs involving cognitive functions and awareness: Secondary | ICD-10-CM | POA: Insufficient documentation

## 2014-12-25 DIAGNOSIS — M6289 Other specified disorders of muscle: Secondary | ICD-10-CM | POA: Insufficient documentation

## 2014-12-25 DIAGNOSIS — IMO0002 Reserved for concepts with insufficient information to code with codable children: Secondary | ICD-10-CM

## 2014-12-25 DIAGNOSIS — H539 Unspecified visual disturbance: Secondary | ICD-10-CM | POA: Insufficient documentation

## 2014-12-25 DIAGNOSIS — R279 Unspecified lack of coordination: Secondary | ICD-10-CM | POA: Diagnosis present

## 2014-12-25 DIAGNOSIS — I6980 Unspecified sequelae of other cerebrovascular disease: Secondary | ICD-10-CM | POA: Diagnosis not present

## 2014-12-25 NOTE — Therapy (Signed)
[facility] [address] [place], [place], [address] Phone: [phone]   Fax:  [phone]  Occupational Therapy Evaluation  Patient Details  Name: Brandy Roberts MRN: [id number] Date of Birth: Oct 28, 1970 Referring Provider:  Charlett Blake, MD  Encounter Date: 12/25/2014      OT End of Session - 12/25/14 1431    Visit Number 1   Number of Visits 17   Date for OT Re-Evaluation 02/25/15   Authorization Type [employer]   OT Start Time [phone]   OT Stop Time 1020   OT Time Calculation (min) 40 min   Activity Tolerance Patient tolerated treatment well      Past Medical History  Diagnosis Date  . Hypertension   . Stroke   . Hyperlipidemia     Past Surgical History  Procedure Laterality Date  . Cesarean section    . Tonsillectomy    . Ganglion cyst excision    . Tee without cardioversion N/A 10/09/2014    Procedure: TRANSESOPHAGEAL ECHOCARDIOGRAM (TEE);  Surgeon: Sanda Klein, MD;  Location: [facility] ENDOSCOPY;  Service: Cardiovascular;  Laterality: N/A;    There were no vitals filed for this visit.  Visit Diagnosis:  Lack of coordination due to stroke - Plan: Ot plan of care cert/re-cert  Hemiparesis affecting nondominant side as late effect of cerebrovascular accident - Plan: Ot plan of care cert/re-cert  Cognitive impairment - Plan: Ot plan of care cert/re-cert  Visual disturbance - Plan: Ot plan of care cert/re-cert  Left arm weakness - Plan: Ot plan of care cert/re-cert  Left hand weakness - Plan: Ot plan of care cert/re-cert      Subjective Assessment - 12/25/14 0947    Pertinent History See EPIC snapshot   Patient Stated Goals walking better, get mobility back in my arm and leg   Currently in Pain? No/denies  in UE's           [facility] OT Assessment - 12/25/14 0950    Assessment   Diagnosis Rt PCA Embolic stroke  Lt hemiparesis (non dominant side)   Onset Date 10/06/14   Assessment Pt  using FWW for ambulation   Prior Therapy CIR 5/10 - 10/18/14   Precautions   Precautions Fall  NO DRIVING   Required Braces or Orthoses --  AFO issued in hospital, but pt did not wear   Restrictions   Weight Bearing Restrictions No   Balance Screen   Has the patient fallen in the past 6 months Yes   How many times? 2   Has the patient had a decrease in activity level because of a fear of falling?  No   Is the patient reluctant to leave their home because of a fear of falling?  Yes   Home  Environment   Bathroom Shower/Tub Tub/Shower unit;Curtain   Home Equipment [place] - 2 wheels;Tub bench;Bedside commode   Lives With Son  1 story apt. (1st floor), 3 STE   Prior Function   Level of Independence Independent  and driving   Vocation Full time employment   Vocation Requirements housekeeping   ADL   ADL comments Eating and grooming Independently. Min assist to hook bra, don socks, tie shoes. Min assist with tub transfer using tub transfer bench. Min assist to bathe back and feet. Pt able to make snack/sandwich and fold laundry but dependent for all cooking and most cleaning at this time   Mobility   Mobility Status Comments using FWW   Written Expression  Dominant Hand Right   Handwriting 100% legible  intact for name. Mild expressive aphasia affecting writing   Vision - History   Baseline Vision Bifocals   Visual History Cataracts   Additional Comments Pt reports increased bluriness since CVA, may have some visual field cut Lt - to be further assessed in following sessions   Cognition   Overall Cognitive Status Impaired/Different from baseline   Mini Mental State Exam  --  Pt with decreased memory, processing speed, concentration,    Sensation   Light Touch Impaired by gross assessment  not consistently id or localizing stimulus   Additional Comments reports numbness/tingling Lt hand   Coordination   9 Hole Peg Test Right;Left   Right 9 Hole Peg Test 23.35 sec   Left 9 Hole  Peg Test 36.75 sec.    Box and Blocks Lt = 31 (with mild ataxia noted)   Coordination ? mild ataxia with gross motor tasks   ROM / Strength   AROM / PROM / Strength AROM;Strength   AROM   Overall AROM Comments BUE AROM WFL's   Strength   Overall Strength Comments RUE grossly 4+/5 at shoulder. LUE grossly 3/5 at shoulder (unable to provide resistance)   Hand Function   Right Hand Grip (lbs) 75 lbs   Left Hand Lateral Pinch 20 lbs                           OT Short Term Goals - 12/25/14 1439    OT SHORT TERM GOAL #1   Title Independent w/ initial HEP for coordination and grip strength (due 01/25/15)   Time 4   Period Weeks   Status New   OT SHORT TERM GOAL #2   Title Improve LUE function as demo by performing 38 blocks or > on Box & Blocks test   Baseline eval = 31   Time 4   Period Weeks   Status New   OT SHORT TERM GOAL #3   Title Mod I level with hooking bra, donning socks, and tying shoes with A/E and/or compensatory strategies prn   Time 4   Period Weeks   Status New   OT SHORT TERM GOAL #4   Title Improve grip strength Lt hand to 35 lbs to assist in opening containers   Baseline eval = 20 lbs (Rt = 75 lbs)   Time 4   Period Weeks   Status New   OT SHORT TERM GOAL #5   Title Tabletop vision goal TBD    Time 4   Period Weeks   Status New   Additional Short Term Goals   Additional Short Term Goals Yes   OT SHORT TERM GOAL #6   Title Cognition goal TBD    Time 4   Period Weeks   Status New           OT Long Term Goals - 12/25/14 1443    OT LONG TERM GOAL #1   Title Independent w/ updated HEP for LUE strengthening (due 02/25/15)   Time 8   Period Weeks   Status New   OT LONG TERM GOAL #2   Title Place 3 # object on high shelf LUE 5/5 trials w/o rest   Baseline eval = unable, 3/5 on MMT (NO resistance tolerated)   Time 8   Period Weeks   Status New   OT LONG TERM GOAL #3   Title Improve coordination as evidenced  by performing 9 hole  peg test in 28 sec. or less   Baseline eval = 36.75 sec. (Rt = 23.35 sec)   Time 8   Period Weeks   Status New   OT LONG TERM GOAL #4   Title Improve grip strength Lt hand to 45 lbs or greater to assist w/ opening containers/jars   Baseline eval = 20 (Rt = 75 lbs)   Time 8   Period Weeks   Status New   OT LONG TERM GOAL #5   Title Pt to return to IADL tasks safely Mod I level (cooking/cleaning)   Time 8   Period Weeks   Status New   Long Term Additional Goals   Additional Long Term Goals Yes   OT LONG TERM GOAL #6   Title Updated cognition goal TBD   Time 8   Period Weeks   Status New               Plan - 12/25/14 1432    Clinical Impression Statement Pt is a 44 y.o. female who presents today s/p Rt PCA embolic stroke with Lt non dominant hemiparesis. Pt with decreased coordination, LUE strength, sensation, visual changes and decreased cognition.    Pt will benefit from skilled therapeutic intervention in order to improve on the following deficits (Retired) Decreased cognition;Decreased knowledge of use of DME;Impaired vision/preception;Decreased coordination;Decreased mobility;Impaired sensation;Decreased activity tolerance;Decreased endurance;Decreased strength;Decreased balance;Decreased safety awareness;Impaired perceived functional ability;Impaired UE functional use   Rehab Potential Good   OT Frequency 2x / week   OT Duration 8 weeks  plus evaluation   OT Treatment/Interventions Self-care/ADL training;Therapeutic exercise;Cognitive remediation/compensation;Visual/perceptual remediation/compensation;Neuromuscular education;Parrafin;Moist Heat;Functional Mobility Training;Therapeutic exercises;Patient/family education;DME and/or AE instruction;Manual Therapy;Passive range of motion;Therapeutic activities;Balance training   Plan Further assess cogniton [facility]), and further assess vision and update goals as appropriate, issue coordination and putty HEP, if time allows -   LIGHT strengthening LUE   Consulted and Agree with Plan of Care Patient        Problem List Patient Active Problem List   Diagnosis Date Noted  . Alteration of sensations, post-stroke 12/22/2014  . Cognitive deficit, post-stroke 11/23/2014  . Hypertension 10/31/2014  . Left hemiparesis 10/23/2014  . Cardiomyopathy due to hypertension 10/23/2014  . Chronic ischemic vertebrobasilar artery thalamic stroke   . Aortic valve vegetation   . Hypertensive heart disease   . Overweight (BMI 25.0-29.9)   . H/O noncompliance with medical treatment, presenting hazards to health   . Aortic regurgitation   . Tobacco use disorder 10/07/2014  . Polysubstance abuse 10/07/2014  . Cerebral infarction due to thrombosis of right middle cerebral artery   . Hyperlipidemia   . Embolic stroke involving right middle cerebral artery 10/06/2014    Carey Bullocks, OTR/L 12/25/2014, 2:51 PM  [facility] [address] [place], [place], [address] Phone: [phone]   Fax:  [phone]

## 2014-12-26 ENCOUNTER — Ambulatory Visit: Payer: Self-pay | Admitting: Neurology

## 2014-12-26 NOTE — Therapy (Signed)
[facility] [address] [place], [place], [address] Phone: [phone]   Fax:  [phone]  Physical Therapy Evaluation  Patient Details  Name: Elon Lomeli MRN: [id number] Date of Birth: [birth year] Referring Provider:  Lance Bosch, NP  Encounter Date: 12/25/2014      PT End of Session - 12/26/14 1346    Visit Number 1   Number of Visits 17   Date for PT Re-Evaluation 02/24/15   Authorization Type [employer]   PT Start Time 1020   PT Stop Time 1102   PT Time Calculation (min) 42 min   Equipment Utilized During Treatment Gait belt   Activity Tolerance Patient tolerated treatment well   Behavior During Therapy [facility] for tasks assessed/performed      Past Medical History  Diagnosis Date  . Hypertension   . Stroke   . Hyperlipidemia     Past Surgical History  Procedure Laterality Date  . Cesarean section    . Tonsillectomy    . Ganglion cyst excision    . Tee without cardioversion N/A 10/09/2014    Procedure: TRANSESOPHAGEAL ECHOCARDIOGRAM (TEE);  Surgeon: Sanda Klein, MD;  Location: [facility] ENDOSCOPY;  Service: Cardiovascular;  Laterality: N/A;    There were no vitals filed for this visit.  Visit Diagnosis:  Hemiparesis affecting nondominant side as late effect of cerebrovascular accident  Weakness of left lower extremity  Abnormality of gait  Ataxia      Subjective Assessment - 12/25/14 1024    Subjective Pt is a 44 year old female who presents to OP PT with reported changes in walking, strength, and memory since CVA on 10/06/14.  Her left side is more affected than the right side.  Pt uses a rolling walker since CVA; prior to CVA, she was ambulating independently.  She has fallen twice since CVA.   Pertinent History CVA 10/06/14   Patient Stated Goals Pt's goal for therapy is to get mobility back for better walking.   Currently in Pain? Yes   Pain Score 6    Pain Location Leg   Pain Orientation  Left   Pain Descriptors / Indicators Sharp;Aching   Pain Type Chronic pain   Pain Onset More than a month ago  after CVA   Pain Frequency Intermittent   Aggravating Factors  comes and goes   Pain Relieving Factors rubbing alleviates pain            [facility] PT Assessment - 12/25/14 1028    Assessment   Medical Diagnosis R PCA embolic stroke   Onset Date/Surgical Date 10/06/14   Precautions   Precautions Fall  No driving   Required Braces or Orthoses --  AFO issued in hospital; not wearing at eval   Balance Screen   Has the patient fallen in the past 6 months Yes   How many times? 2   Has the patient had a decrease in activity level because of a fear of falling?  Yes   Is the patient reluctant to leave their home because of a fear of falling?  Yes   [place] Private residence   Living Arrangements Children  Son   Available Help at Discharge Family   Type of [place] to enter   Entrance Stairs-Number of Steps 3   Entrance Stairs-Rails Right   Home Layout One level   [place] - 2 wheels   Prior Function   Level  of Independence Independent;Independent with community mobility without device   Vocation Full time employment   Vocation Requirements housekeeping   Observation/Other Assessments   Focus on Therapeutic Outcomes (FOTO)  pt had not completed; not in FOTO system   Sensation   Light Touch Impaired by gross assessment  to light touch   ROM / Strength   AROM / PROM / Strength Strength   Strength   Strength Assessment Site Hip;Knee;Ankle   Right/Left Hip Right;Left   Right Hip Flexion 4/5   Left Hip Flexion 3/5   Right/Left Knee Right;Left   Right Knee Flexion 3-/5   Right Knee Extension 4/5   Left Knee Flexion 4/5   Left Knee Extension 4/5   Right/Left Ankle Right;Left   Right Ankle Dorsiflexion 4/5   Left Ankle Dorsiflexion 3/5   Transfers   Transfers Sit to Stand;Stand to Sit   Sit to  Stand 5: Supervision;With upper extremity assist;From chair/3-in-1   Sit to Stand Details (indicate cue type and reason) requires UE support; upon standing, pt has decreased L weighshift   Stand to Sit 5: Supervision;With upper extremity assist;To chair/3-in-1   Ambulation/Gait   Ambulation/Gait Yes   Ambulation/Gait Assistance 4: Min assist   Ambulation Distance (Feet) 50 Feet   Assistive device Rolling walker   Gait Pattern Step-through pattern;Decreased step length - left;Decreased stance time - left;Decreased step length - right;Decreased dorsiflexion - left;Decreased weight shift to left;Ataxic;Wide base of support;Poor foot clearance - left  pt not wearing AFO   Ambulation Surface Level;Indoor   Gait velocity 30.64 sec=1.07 ft/sec   Standardized Balance Assessment   Standardized Balance Assessment Timed Up and Go Test;Berg Balance Test   Berg Balance Test   Sit to Stand Able to stand  independently using hands   Standing Unsupported Able to stand 2 minutes with supervision   Sitting with Back Unsupported but Feet Supported on Floor or Stool Able to sit safely and securely 2 minutes   Stand to Sit Controls descent by using hands   Transfers Able to transfer with verbal cueing and /or supervision   Standing Unsupported with Eyes Closed Able to stand 10 seconds with supervision   Standing Ubsupported with Feet Together Able to place feet together independently but unable to hold for 30 seconds   From Standing, Reach Forward with Outstretched Arm Can reach confidently >25 cm (10")   From Standing Position, Pick up Object from Floor Able to pick up shoe, needs supervision   From Standing Position, Turn to Look Behind Over each Shoulder Needs supervision when turning   Turn 360 Degrees Needs assistance while turning   Standing Unsupported, Alternately Place Feet on Step/Stool Needs assistance to keep from falling or unable to try   Standing Unsupported, One Foot in Front Able to take small  step independently and hold 30 seconds   Standing on One Leg Tries to lift leg/unable to hold 3 seconds but remains standing independently   Total Score 31   Timed Up and Go Test   Normal TUG (seconds) 36.24                             PT Short Term Goals - 12/26/14 1454    PT SHORT TERM GOAL #1   Title Pt will perform HEP with family supervision for improved strength, balance, gait.  Target 01/24/15   Baseline No current HEP   Time 4   Period Weeks  Status New   PT SHORT TERM GOAL #2   Title Pt will perform at least 8 of 10 reps of sit<>stand transfers with minimal to no UE support for improved transfer efficiency and safety.   Baseline Pt requires supervision and bilateral UE support for sit<>stand transfers.   Time 4   Period Weeks   Status New   PT SHORT TERM GOAL #3   Title Pt will improve Berg Balance score to at least 36/56 for decreased fall risk.   Baseline Berg score 31/56 at eval (Scores <45/56 are indicative of increased fall risk.)   Time 4   Period Weeks   Status New   PT SHORT TERM GOAL #4   Title Pt will improve TUG score to less than or equal to 30 seconds for decreased fall risk.   Baseline TUG score 36.24 sec at eval; Scores >13.5 sec indicative of increased fall risk; >30 sec indicative of difficulty with ADLs in the home   Time 4   Period Weeks   Status New   PT SHORT TERM GOAL #5   Title Pt will verbalize understanding of CVA education   Baseline Dependent   Time 4   Period Weeks   Status New           PT Long Term Goals - 12/26/14 1457    PT LONG TERM GOAL #1   Title Pt will verbalize understanding of fall prevention within the home environment.  Target date 02/24/15   Baseline Pt at fall risk per Berg, TUG, gait velocity scores   Time 8   Period Weeks   Status New   PT LONG TERM GOAL #2   Title Pt will improve gait velocity to at least 1.8 ft/sec for decreased risk of falls/improved gait efficiency.   Baseline gait  velocity 1.07 ft/sec   Time 8   Period Weeks   Status New   PT LONG TERM GOAL #3   Title Pt will improve Berg Balance score to at least 41/56 for decreased fall risk.   Baseline Berg score 31/56 at eval   Time 8   Period Weeks   Status New   PT LONG TERM GOAL #4   Title Pt will improve TUG score to less than or equal to 20 seconds for decreased fall risk.   Baseline TUG score 36.24 sec at eval   Time 8   Period Weeks   PT LONG TERM GOAL #5   Title Pt will ambulate household distances, at least 50-100 ft using cane, modified independently, for improved household gait.   Baseline Gait 50 ft with RW at eval   Time 8   Period Weeks   Status New               Plan - 12/26/14 1346    Clinical Impression Statement Pt is a 44 year old female who presents to OP PT with history of R PCA emobolic infarct on 02/07/40.  She was hospitalized on CIR from 10/10/14-10/18/14, when she was discharged home.  She reports not having any therapies at home.  Pt reports L sided weakness, difficulty walking due to ataxic gait and L leg feeling that it may give way.  She was independent prior to CVA and she currently ambulates with a walker with minimal assistance.  She is at a fall risk per Merrilee Jansky score of 31/56, TUG score of 36.24 seconds and gait velocity score of 1.07 ft/sec.  She would benefit from skilled physical  therapy to address weakness, balance, transfers, gait and overall functional mobility for decreased fall risk.   Pt will benefit from skilled therapeutic intervention in order to improve on the following deficits Abnormal gait;Decreased balance;Decreased endurance;Decreased safety awareness;Decreased mobility;Decreased strength;Difficulty walking   Rehab Potential Good   PT Frequency 2x / week   PT Duration 8 weeks  plus eval   PT Treatment/Interventions ADLs/Self Care Home Management;Gait training;Neuromuscular re-education;Balance training;Therapeutic exercise;Therapeutic activities;Functional  mobility training;Patient/family education;Orthotic Fit/Training         Problem List Patient Active Problem List   Diagnosis Date Noted  . Alteration of sensations, post-stroke 12/22/2014  . Cognitive deficit, post-stroke 11/23/2014  . Hypertension 10/31/2014  . Left hemiparesis 10/23/2014  . Cardiomyopathy due to hypertension 10/23/2014  . Chronic ischemic vertebrobasilar artery thalamic stroke   . Aortic valve vegetation   . Hypertensive heart disease   . Overweight (BMI 25.0-29.9)   . H/O noncompliance with medical treatment, presenting hazards to health   . Aortic regurgitation   . Tobacco use disorder 10/07/2014  . Polysubstance abuse 10/07/2014  . Cerebral infarction due to thrombosis of right middle cerebral artery   . Hyperlipidemia   . Embolic stroke involving right middle cerebral artery 10/06/2014    Geraldene Eisel W. 12/26/2014, 3:04 PM  Frazier Butt., PT  [facility] [address] [place], [place], [address] Phone: [phone]   Fax:  [phone]

## 2015-01-01 ENCOUNTER — Ambulatory Visit
Payer: No Typology Code available for payment source | Attending: Physical Medicine & Rehabilitation | Admitting: Physical Therapy

## 2015-01-01 ENCOUNTER — Ambulatory Visit: Payer: No Typology Code available for payment source | Admitting: Occupational Therapy

## 2015-01-01 DIAGNOSIS — I69359 Hemiplegia and hemiparesis following cerebral infarction affecting unspecified side: Secondary | ICD-10-CM

## 2015-01-01 DIAGNOSIS — R279 Unspecified lack of coordination: Secondary | ICD-10-CM | POA: Insufficient documentation

## 2015-01-01 DIAGNOSIS — H539 Unspecified visual disturbance: Secondary | ICD-10-CM | POA: Diagnosis present

## 2015-01-01 DIAGNOSIS — R29898 Other symptoms and signs involving the musculoskeletal system: Secondary | ICD-10-CM | POA: Insufficient documentation

## 2015-01-01 DIAGNOSIS — R4189 Other symptoms and signs involving cognitive functions and awareness: Secondary | ICD-10-CM

## 2015-01-01 DIAGNOSIS — R270 Ataxia, unspecified: Secondary | ICD-10-CM | POA: Insufficient documentation

## 2015-01-01 DIAGNOSIS — R269 Unspecified abnormalities of gait and mobility: Secondary | ICD-10-CM | POA: Insufficient documentation

## 2015-01-01 DIAGNOSIS — I6980 Unspecified sequelae of other cerebrovascular disease: Secondary | ICD-10-CM | POA: Diagnosis present

## 2015-01-01 DIAGNOSIS — M6289 Other specified disorders of muscle: Secondary | ICD-10-CM | POA: Insufficient documentation

## 2015-01-01 DIAGNOSIS — I69859 Hemiplegia and hemiparesis following other cerebrovascular disease affecting unspecified side: Secondary | ICD-10-CM | POA: Insufficient documentation

## 2015-01-01 DIAGNOSIS — R42 Dizziness and giddiness: Secondary | ICD-10-CM | POA: Insufficient documentation

## 2015-01-01 NOTE — Therapy (Signed)
[facility] [address] [place], [place], [address] Phone: [phone]   Fax:  [phone]  Physical Therapy Treatment  Patient Details  Name: Brandy Roberts MRN: [id number] Date of Birth: 14-Sep-1970 Referring Provider:  Lance Bosch, NP  Encounter Date: 01/01/2015      PT End of Session - 01/01/15 1752    Visit Number 3   Number of Visits 17   Date for PT Re-Evaluation 02/24/15   Authorization Type [employer]   PT Start Time [id number]  Pt arrived late to session   PT Stop Time 0930   PT Time Calculation (min) 38 min   Equipment Utilized During Treatment Gait belt   Activity Tolerance Patient tolerated treatment well   Behavior During Therapy [facility] for tasks assessed/performed      Past Medical History  Diagnosis Date  . Hypertension   . Stroke   . Hyperlipidemia     Past Surgical History  Procedure Laterality Date  . Cesarean section    . Tonsillectomy    . Ganglion cyst excision    . Tee without cardioversion N/A 10/09/2014    Procedure: TRANSESOPHAGEAL ECHOCARDIOGRAM (TEE);  Surgeon: Sanda Klein, MD;  Location: [facility] ENDOSCOPY;  Service: Cardiovascular;  Laterality: N/A;    There were no vitals filed for this visit.  Visit Diagnosis:  Hemiparesis affecting nondominant side as late effect of cerebrovascular accident  Abnormality of gait  Dizziness and giddiness      Subjective Assessment - 01/01/15 0858    Subjective Pt denies falling, reports no changes. Reporting dizziness "when I sit up fro laying down and when I stand up too fast, per pt." Pt also reports perception that "my left knee is giving way" during walking. Upon further questioning, pt describing L knee hyperextension. Pt also reports that she most often loses balance forward, but states, "I do have trouble remembering. " MD aware of memory impairment, per pt.   Pertinent History CVA 10/06/14   Patient Stated Goals Pt's goal for therapy is  to get mobility back for better walking.   Currently in Pain? No/denies               Vestibular Assessment - 01/01/15 0001    Vestibular Assessment   General Observation increased blinking following sit > stand. Upon further questioning, pt reporting lightheadedness  Less lightheadedness during sit>stand with visual target   Symptom Behavior   Type of Dizziness Blurred vision  "I can't really remember what causes it."   Occulomotor Exam   Occulomotor Alignment Normal   Spontaneous Absent   Gaze-induced Absent   Smooth Pursuits Saccades  most prominent with horizontal tracking R to midline   Comment dizziness, lightheadedness increased from 0 to 6/10 when horizontally tracking from R to midline   Orthostatics   BP supine (x 5 minutes) 125/64 mmHg   HR supine (x 5 minutes) 61   BP sitting 102/61 mmHg  lightheaded, dizzy   HR sitting 64   BP standing (after 1 minute) 110/76 mmHg  dizzy, lightheaded   HR standing (after 1 minute) 77   BP standing (after 3 minutes) --  unable to complete due to postural instability   Orthostatics Comment After lying in supine x3 minutes with BLE's elevated, pt performing LE exercises for increased venous return, noted the following vitals: BP 124/60, HR 60                 [facility] Adult PT Treatment/Exercise - 01/01/15 0001  Transfers   Transfers Sit to Stand;Stand to Sit;Supine to Sit;Sit to Supine   Sit to Stand 5: Supervision   Stand to Sit 5: Supervision   Supine to Sit 5: Supervision   Supine to Sit Details (indicate cue type and reason) increased time   Sit to Supine 5: Supervision   Sit to Supine Details  increased time   Ambulation/Gait   Ambulation/Gait Yes   Ambulation/Gait Assistance 4: Min guard   Ambulation Distance (Feet) 210 Feet  x160' then x50'   Assistive device Rolling walker   Gait Pattern Step-through pattern;Decreased step length - left;Decreased stance time - left;Decreased step length -  right;Decreased dorsiflexion - left;Decreased weight shift to left;Ataxic;Wide base of support;Poor foot clearance - left  pt not wearing AFO   Ambulation Surface Level;Indoor                PT Education - 01/01/15 1742    Education provided Yes   Education Details Management of orthostatic hypotension, with focus on hydration, compressive garments, and LE exercise to maintain BP. Precautions (remaining standing prior to initiating walking) for fall prevention with lightheadedness, orthostasis.   Person(s) Educated Patient   Methods Explanation;Demonstration   Comprehension Verbalized understanding          PT Short Term Goals - 01/01/15 1751    PT SHORT TERM GOAL #1   Title Pt will perform HEP with family supervision for improved strength, balance, gait.  Target 01/24/15   Baseline No current HEP   Time 4   Period Weeks   Status On-going   PT SHORT TERM GOAL #2   Title Pt will perform at least 8 of 10 reps of sit<>stand transfers with minimal to no UE support for improved transfer efficiency and safety.   Baseline Pt requires supervision and bilateral UE support for sit<>stand transfers.   Time 4   Period Weeks   Status On-going   PT SHORT TERM GOAL #3   Title Pt will improve Berg Balance score to at least 36/56 for decreased fall risk.   Baseline Berg score 31/56 at eval (Scores <45/56 are indicative of increased fall risk.)   Time 4   Period Weeks   Status On-going   PT SHORT TERM GOAL #4   Title Pt will improve TUG score to less than or equal to 30 seconds for decreased fall risk.   Baseline TUG score 36.24 sec at eval; Scores >13.5 sec indicative of increased fall risk; >30 sec indicative of difficulty with ADLs in the home   Time 4   Period Weeks   Status On-going   PT SHORT TERM GOAL #5   Title Pt will verbalize understanding of CVA education   Baseline Dependent   Time 4   Period Weeks   Status On-going           PT Long Term Goals - 01/01/15 1751     PT LONG TERM GOAL #1   Title Pt will verbalize understanding of fall prevention within the home environment.  Target date 02/24/15   Baseline Pt at fall risk per Berg, TUG, gait velocity scores   Time 8   Period Weeks   Status On-going   PT LONG TERM GOAL #2   Title Pt will improve gait velocity to at least 1.8 ft/sec for decreased risk of falls/improved gait efficiency.   Baseline gait velocity 1.07 ft/sec   Time 8   Period Weeks   Status On-going   PT LONG TERM  GOAL #3   Title Pt will improve Berg Balance score to at least 41/56 for decreased fall risk.   Baseline Berg score 31/56 at eval   Time 8   Period Weeks   Status On-going   PT LONG TERM GOAL #4   Title Pt will improve TUG score to less than or equal to 20 seconds for decreased fall risk.   Baseline TUG score 36.24 sec at eval   Time 8   Period Weeks   Status On-going   PT LONG TERM GOAL #5   Title Pt will ambulate household distances, at least 50-100 ft using cane, modified independently, for improved household gait.   Baseline Gait 50 ft with RW at eval   Time 8   Period Weeks   Status On-going               Plan - 01/01/15 1752    Clinical Impression Statement Due to pt report of lightheadedness and noted postural sway in standing, majority of PT session focused on addressing origin of said symptoms to increase pt safety. Noted orthostatic hypotension, as exhibited by >20 mm Hg decrease in SPB between sitting and standing accompanied by symptoms. Occulomotor exam also suggestive of central visual impairments which could be attributing to disequilibrium. Provided education on orthostatic hypotension with focus on hydration, compression garments, and precautions/safety. Continue per POC.    Pt will benefit from skilled therapeutic intervention in order to improve on the following deficits Abnormal gait;Decreased balance;Decreased endurance;Decreased safety awareness;Decreased mobility;Decreased  strength;Difficulty walking   Rehab Potential Good   PT Frequency 2x / week   PT Duration 8 weeks   PT Treatment/Interventions ADLs/Self Care Home Management;Gait training;Neuromuscular re-education;Balance training;Therapeutic exercise;Therapeutic activities;Functional mobility training;Patient/family education;Orthotic Fit/Training   PT Next Visit Plan Initiate HEP. Consider further vestibular assessment if pt continuing to c/o dizziness/lightheadedness with transitional movements and head turns.   Consulted and Agree with Plan of Care Patient        Problem List Patient Active Problem List   Diagnosis Date Noted  . Alteration of sensations, post-stroke 12/22/2014  . Cognitive deficit, post-stroke 11/23/2014  . Hypertension 10/31/2014  . Left hemiparesis 10/23/2014  . Cardiomyopathy due to hypertension 10/23/2014  . Chronic ischemic vertebrobasilar artery thalamic stroke   . Aortic valve vegetation   . Hypertensive heart disease   . Overweight (BMI 25.0-29.9)   . H/O noncompliance with medical treatment, presenting hazards to health   . Aortic regurgitation   . Tobacco use disorder 10/07/2014  . Polysubstance abuse 10/07/2014  . Cerebral infarction due to thrombosis of right middle cerebral artery   . Hyperlipidemia   . Embolic stroke involving right middle cerebral artery 10/06/2014   Billie Ruddy, PT, DPT [facility] [address] [place], [place], [address] Phone: [phone]   Fax:  [phone] 01/01/2015, 5:58 PM

## 2015-01-01 NOTE — Therapy (Signed)
[facility] [address] [place], [place], [address] Phone: [phone]   Fax:  [phone]  Occupational Therapy Treatment  Patient Details  Name: Brandy Roberts MRN: [id number] Date of Birth: [birth year] Referring Provider:  Lance Bosch, NP  Encounter Date: 01/01/2015      OT End of Session - 01/01/15 1347    Visit Number 2   Number of Visits 17   Date for OT Re-Evaluation 02/25/15   Authorization Type [employer]   OT Start Time 0930   OT Stop Time 1015   OT Time Calculation (min) 45 min   Activity Tolerance Patient tolerated treatment well      Past Medical History  Diagnosis Date  . Hypertension   . Stroke   . Hyperlipidemia     Past Surgical History  Procedure Laterality Date  . Cesarean section    . Tonsillectomy    . Ganglion cyst excision    . Tee without cardioversion N/A 10/09/2014    Procedure: TRANSESOPHAGEAL ECHOCARDIOGRAM (TEE);  Surgeon: Sanda Klein, MD;  Location: [facility] ENDOSCOPY;  Service: Cardiovascular;  Laterality: N/A;    There were no vitals filed for this visit.  Visit Diagnosis:  Cognitive impairment  Visual disturbance      Subjective Assessment - 01/01/15 0934    Subjective  I feel a little lightheaded, but P.T. checked my BP   Pertinent History See EPIC snapshot   Patient Stated Goals walking better, get mobility back in my arm and leg   Currently in Pain? No/denies              Vestibular Assessment - 01/01/15 0001    Symptom Behavior   Type of Dizziness Blurred vision   Occulomotor Exam   Smooth Pursuits Saccades  most prominent with horizontal tracking R to midline   Comment dizziness, lightheadedness increased from 0 to 6/10 when horizontally tracking from R to midline                OT Treatments/Exercises (OP) - 01/01/15 0001    Cognitive Exercises   Other Cognitive Exercises 1 Assessed cognition further with MOCA - Pt scored 16/30 with  majority of answers missed in naming, memory (immediate and delayed recall), and language - will request speech therapy referral from Dr. Letta Pate   Visual/Perceptual Exercises   Letter Search Letter cancellation 46M print size (2 pages) with 4 omissions (approx. 95% accuracy), and no evidence of Lt neglect. Pt missed 2  on Lt, 1 center, and 1 on right.    Other Exercises Further assessed vision: VF's appear intact with confrontation testing and OROM intact. However, visual tracking and convergence/divergence impaired with eyes not working together. Pt has diplopia with central vision and vision slightly to right side. Will also recommend opthamalogy consult                  OT Short Term Goals - 01/01/15 1347    OT SHORT TERM GOAL #1   Title Independent w/ initial HEP for coordination and grip strength (due 01/25/15)   Time 4   Period Weeks   Status New   OT SHORT TERM GOAL #2   Title Improve LUE function as demo by performing 38 blocks or > on Box & Blocks test   Baseline eval = 31   Time 4   Period Weeks   Status New   OT SHORT TERM GOAL #3   Title Mod I level with hooking bra, donning socks,  and tying shoes with A/E and/or compensatory strategies prn   Time 4   Period Weeks   Status New   OT SHORT TERM GOAL #4   Title Improve grip strength Lt hand to 35 lbs to assist in opening containers   Baseline eval = 20 lbs (Rt = 75 lbs)   Time 4   Period Weeks   Status New   OT SHORT TERM GOAL #5   Title Pt independent with HEP for vision (tracking and convergence)   Time 4   Period Weeks   Status New   OT SHORT TERM GOAL #6   Title Cognition goal TBD    Time 4   Period Weeks   Status Deferred  Requesting speech therapy referral to address secondary to appearing more langauge based. Pt also appears to have decreased processing speed           OT Long Term Goals - 01/01/15 1349    OT LONG TERM GOAL #1   Title Independent w/ updated HEP for LUE strengthening (due  02/25/15)   Time 8   Period Weeks   Status New   OT LONG TERM GOAL #2   Title Place 3 # object on high shelf LUE 5/5 trials w/o rest   Baseline eval = unable, 3/5 on MMT (NO resistance tolerated)   Time 8   Period Weeks   Status New   OT LONG TERM GOAL #3   Title Improve coordination as evidenced by performing 9 hole peg test in 28 sec. or less   Baseline eval = 36.75 sec. (Rt = 23.35 sec)   Time 8   Period Weeks   Status New   OT LONG TERM GOAL #4   Title Improve grip strength Lt hand to 45 lbs or greater to assist w/ opening containers/jars   Baseline eval = 20 (Rt = 75 lbs)   Time 8   Period Weeks   Status New   OT LONG TERM GOAL #5   Title Pt to return to IADL tasks safely Mod I level (cooking/cleaning)   Time 8   Period Weeks   Status New   OT LONG TERM GOAL #6   Title Updated cognition goal TBD   Time 8   Period Weeks   Status Deferred  speech therapy referral made to address               Plan - 01/01/15 1349    Clinical Impression Statement Pt with impairments on the [facility]  for naming, memory, and language. Speech therapy referral requested. Pt also with visual disturbance and diplopia in central vision secondary to dysconjugate gaze during convergence/divergence and tracking. Will also request neuro-opthamology consult.    Plan issue coordination and putty HEP, Light strengtheing LUE as tolerated   Recommended Other Services speech therapy consult, neuro-opthamalogy consult   Consulted and Agree with Plan of Care Patient        Problem List Patient Active Problem List   Diagnosis Date Noted  . Alteration of sensations, post-stroke 12/22/2014  . Cognitive deficit, post-stroke 11/23/2014  . Hypertension 10/31/2014  . Left hemiparesis 10/23/2014  . Cardiomyopathy due to hypertension 10/23/2014  . Chronic ischemic vertebrobasilar artery thalamic stroke   . Aortic valve vegetation   . Hypertensive heart disease   . Overweight (BMI 25.0-29.9)   . H/O  noncompliance with medical treatment, presenting hazards to health   . Aortic regurgitation   . Tobacco use disorder 10/07/2014  . Polysubstance  abuse 10/07/2014  . Cerebral infarction due to thrombosis of right middle cerebral artery   . Hyperlipidemia   . Embolic stroke involving right middle cerebral artery 10/06/2014    Carey Bullocks, OTR/L 01/01/2015, 1:54 PM  [facility] [address] [place], [place], [address] Phone: [phone]   Fax:  [phone]

## 2015-01-03 ENCOUNTER — Ambulatory Visit: Payer: No Typology Code available for payment source | Admitting: Occupational Therapy

## 2015-01-03 ENCOUNTER — Ambulatory Visit: Payer: No Typology Code available for payment source | Admitting: Physical Therapy

## 2015-01-08 ENCOUNTER — Ambulatory Visit: Payer: No Typology Code available for payment source | Admitting: Occupational Therapy

## 2015-01-08 ENCOUNTER — Ambulatory Visit: Payer: No Typology Code available for payment source | Admitting: Physical Therapy

## 2015-01-09 ENCOUNTER — Ambulatory Visit: Payer: No Typology Code available for payment source | Admitting: Occupational Therapy

## 2015-01-11 ENCOUNTER — Ambulatory Visit: Payer: No Typology Code available for payment source | Admitting: Physical Therapy

## 2015-01-11 ENCOUNTER — Ambulatory Visit: Payer: No Typology Code available for payment source | Admitting: Occupational Therapy

## 2015-01-11 ENCOUNTER — Encounter: Payer: Self-pay | Admitting: Occupational Therapy

## 2015-01-11 DIAGNOSIS — I69859 Hemiplegia and hemiparesis following other cerebrovascular disease affecting unspecified side: Secondary | ICD-10-CM | POA: Diagnosis not present

## 2015-01-11 DIAGNOSIS — I69359 Hemiplegia and hemiparesis following cerebral infarction affecting unspecified side: Secondary | ICD-10-CM

## 2015-01-11 DIAGNOSIS — R270 Ataxia, unspecified: Secondary | ICD-10-CM

## 2015-01-11 DIAGNOSIS — H539 Unspecified visual disturbance: Secondary | ICD-10-CM

## 2015-01-11 NOTE — Therapy (Signed)
[facility] [address] [place], [place], [address] Phone: [phone]   Fax:  [phone]  Occupational Therapy Treatment  Patient Details  Name: Brandy Roberts MRN: [id number] Date of Birth: 06/11/1970 Referring Provider:  Lance Bosch, NP  Encounter Date: 01/11/2015      OT End of Session - 01/11/15 1612    Visit Number 3   Number of Visits 17   Date for OT Re-Evaluation 02/25/15   Authorization Type [employer]   OT Start Time 1400   OT Stop Time 1440   OT Time Calculation (min) 40 min   Activity Tolerance Patient tolerated treatment well   Behavior During Therapy Anxious  tearful      Past Medical History  Diagnosis Date  . Hypertension   . Stroke   . Hyperlipidemia     Past Surgical History  Procedure Laterality Date  . Cesarean section    . Tonsillectomy    . Ganglion cyst excision    . Tee without cardioversion N/A 10/09/2014    Procedure: TRANSESOPHAGEAL ECHOCARDIOGRAM (TEE);  Surgeon: Sanda Klein, MD;  Location: [facility] ENDOSCOPY;  Service: Cardiovascular;  Laterality: N/A;    There were no vitals filed for this visit.  Visit Diagnosis:  Visual disturbance  Hemiparesis affecting nondominant side as late effect of cerebrovascular accident  Ataxia      Subjective Assessment - 01/11/15 1556    Subjective  Patient very tearful this episode, upset by resultant deficits of stroke  and recent domestic situation.     Patient is accompained by: --  friend   Pertinent History See EPIC snapshot   Limitations Patient has missed prior appointments this week due to domestic difficulties.   Patient Stated Goals walking better, get mobility back in my arm and leg   Currently in Pain? No/denies   Pain Score 0-No pain                      OT Treatments/Exercises (OP) - 01/11/15 0001    ADLs   LB Dressing Worked toward short term goal of tying her shoes.  Patient had difficulty  sustaining forward bend to reach right shoe, but able to cross legs and use bilateral upper extremities to effectively tie lace.  Patient with significant hip tension which limited her ability to cross elft leg for same technique.   Visual/Perceptual Exercises   Other Exercises Patient today appears to have diplopia more evident in both upper quadrants with approximately 15 degrees to left or right, in lower visual fields diplopia evident approxiamtely 30 degrees left / right.  Patient able to sustain  single focus in midline without report of diplopia.     Neurological Re-education Exercises   Other Exercises 1 Patient with reduced ability to grade muscle activity in left leg and arm,  With facilitation, and increased attention to fluid motor patterns, patient able to complete gentle guided motion to reach to eye level.     Other Exercises 2 Worked on dynamic standing with decreased UE support, weight shifting left/right/forward /backward, squatting and sustained control of flexion, and simple step turns.                  OT Education - 01/11/15 1608    Education provided Yes   Education Details areas of visual field where coordination is better, and implications for tasks requiring visual attention   Person(s) Educated Patient   Methods Explanation;Demonstration   Comprehension Verbal cues required  OT Short Term Goals - 01/01/15 1347    OT SHORT TERM GOAL #1   Title Independent w/ initial HEP for coordination and grip strength (due 01/25/15)   Time 4   Period Weeks   Status New   OT SHORT TERM GOAL #2   Title Improve LUE function as demo by performing 38 blocks or > on Box & Blocks test   Baseline eval = 31   Time 4   Period Weeks   Status New   OT SHORT TERM GOAL #3   Title Mod I level with hooking bra, donning socks, and tying shoes with A/E and/or compensatory strategies prn   Time 4   Period Weeks   Status New   OT SHORT TERM GOAL #4   Title Improve grip  strength Lt hand to 35 lbs to assist in opening containers   Baseline eval = 20 lbs (Rt = 75 lbs)   Time 4   Period Weeks   Status New   OT SHORT TERM GOAL #5   Title Pt independent with HEP for vision (tacking and convergence)   Time 4   Period Weeks   Status New   OT SHORT TERM GOAL #6   Title Cognition goal TBD    Time 4   Period Weeks   Status Deferred  Requesting speech therapy referral to address secondary to appearing more langauge based. Pt also appears to have decreased processing speed           OT Long Term Goals - 01/01/15 1349    OT LONG TERM GOAL #1   Title Independent w/ updated HEP for LUE strengthening (due 02/25/15)   Time 8   Period Weeks   Status New   OT LONG TERM GOAL #2   Title Place 3 # object on high shelf LUE 5/5 trials w/o rest   Baseline eval = unable, 3/5 on MMT (NO resistance tolerated)   Time 8   Period Weeks   Status New   OT LONG TERM GOAL #3   Title Improve coordination as evidenced by performing 9 hole peg test in 28 sec. or less   Baseline eval = 36.75 sec. (Rt = 23.35 sec)   Time 8   Period Weeks   Status New   OT LONG TERM GOAL #4   Title Improve grip strength Lt hand to 45 lbs or greater to assist w/ opening containers/jars   Baseline eval = 20 (Rt = 75 lbs)   Time 8   Period Weeks   Status New   OT LONG TERM GOAL #5   Title Pt to return to IADL tasks safely Mod I level (cooking/cleaning)   Time 8   Period Weeks   Status New   OT LONG TERM GOAL #6   Title Updated cognition goal TBD   Time 8   Period Weeks   Status Deferred  speech therapy referral made to address               Plan - 01/11/15 1612    Clinical Impression Statement Patient presents to OT with impairments in visual, perceptual, cognitive / language, and neuromotor systems.  She also has some psychosocial issues which are creating a challenge to her ability to fully participate in OP OT program this week.  Patient will benefit from skilled OT  intervention to address these impairment areas and decrease level of assisatnce needed for ADL/IADL.     Pt will benefit from skilled therapeutic  intervention in order to improve on the following deficits (Retired) Decreased cognition;Decreased knowledge of use of DME;Impaired vision/preception;Decreased coordination;Decreased mobility;Impaired sensation;Decreased activity tolerance;Decreased endurance;Decreased strength;Decreased balance;Decreased safety awareness;Impaired perceived functional ability;Impaired UE functional use   Rehab Potential Good   OT Frequency 2x / week   OT Duration 8 weeks   OT Treatment/Interventions Self-care/ADL training;Therapeutic exercise;Cognitive remediation/compensation;Visual/perceptual remediation/compensation;Neuromuscular education;Parrafin;Moist Heat;Functional Mobility Training;Therapeutic exercises;Patient/family education;DME and/or AE instruction;Manual Therapy;Passive range of motion;Therapeutic activities;Balance training   Plan Neuromuscular reed to left UE with emphasis on smooth, controlled movement / guided movement,  Initiate home exercise program,    Consulted and Agree with Plan of Care Patient        Problem List Patient Active Problem List   Diagnosis Date Noted  . Alteration of sensations, post-stroke 12/22/2014  . Cognitive deficit, post-stroke 11/23/2014  . Hypertension 10/31/2014  . Left hemiparesis 10/23/2014  . Cardiomyopathy due to hypertension 10/23/2014  . Chronic ischemic vertebrobasilar artery thalamic stroke   . Aortic valve vegetation   . Hypertensive heart disease   . Overweight (BMI 25.0-29.9)   . H/O noncompliance with medical treatment, presenting hazards to health   . Aortic regurgitation   . Tobacco use disorder 10/07/2014  . Polysubstance abuse 10/07/2014  . Cerebral infarction due to thrombosis of right middle cerebral artery   . Hyperlipidemia   . Embolic stroke involving right middle cerebral artery 10/06/2014     Mariah Milling, OTR/L 01/11/2015, 4:34 PM  [facility] [address] [place], [place], [address] Phone: [phone]   Fax:  [phone]

## 2015-01-12 NOTE — Therapy (Signed)
[facility] [address] [place], [place], [address] Phone: [phone]   Fax:  [phone]  Patient Details  Name: Brandy Roberts MRN: [id number] Date of Birth: 1970-09-16 Referring Provider:  Lance Bosch, NP  Encounter Date: 01/11/2015  No billable PT services provided today, as PT session not initiated secondary to personal security issue.   Billie Ruddy, PT, [facility] [address] [place], [place], [address] Phone: [phone]   Fax:  [phone] 01/12/2015, 9:14 AM

## 2015-01-15 ENCOUNTER — Ambulatory Visit: Payer: No Typology Code available for payment source | Admitting: Physical Therapy

## 2015-01-15 ENCOUNTER — Ambulatory Visit: Payer: No Typology Code available for payment source | Admitting: Occupational Therapy

## 2015-01-15 DIAGNOSIS — I69859 Hemiplegia and hemiparesis following other cerebrovascular disease affecting unspecified side: Secondary | ICD-10-CM | POA: Diagnosis not present

## 2015-01-15 DIAGNOSIS — I69359 Hemiplegia and hemiparesis following cerebral infarction affecting unspecified side: Secondary | ICD-10-CM

## 2015-01-15 DIAGNOSIS — R269 Unspecified abnormalities of gait and mobility: Secondary | ICD-10-CM

## 2015-01-15 DIAGNOSIS — R29898 Other symptoms and signs involving the musculoskeletal system: Secondary | ICD-10-CM

## 2015-01-15 DIAGNOSIS — IMO0002 Reserved for concepts with insufficient information to code with codable children: Secondary | ICD-10-CM

## 2015-01-15 NOTE — Therapy (Signed)
[facility] [address] [place], [place], [address] Phone: [phone]   Fax:  [phone]  Occupational Therapy Treatment  Patient Details  Name: Brandy Roberts MRN: [id number] Date of Birth: 04-24-1971 Referring Provider:  Lance Bosch, NP  Encounter Date: 01/15/2015      OT End of Session - 01/15/15 1309    Visit Number 4   Number of Visits 17   Date for OT Re-Evaluation 02/25/15   Authorization Type [employer]   Authorization Time Period week 3/8   OT Start Time 1230   OT Stop Time 1310   OT Time Calculation (min) 40 min   Activity Tolerance Patient tolerated treatment well      Past Medical History  Diagnosis Date  . Hypertension   . Stroke   . Hyperlipidemia     Past Surgical History  Procedure Laterality Date  . Cesarean section    . Tonsillectomy    . Ganglion cyst excision    . Tee without cardioversion N/A 10/09/2014    Procedure: TRANSESOPHAGEAL ECHOCARDIOGRAM (TEE);  Surgeon: Sanda Klein, MD;  Location: [facility] ENDOSCOPY;  Service: Cardiovascular;  Laterality: N/A;    There were no vitals filed for this visit.  Visit Diagnosis:  Lack of coordination due to stroke  Left hand weakness      Subjective Assessment - 01/15/15 1241    Subjective  I filled out my paperwork for a restraining order against my ex   Pertinent History See EPIC snapshot   Limitations Patient has missed prior appointments this week due to domestic difficulties.   Patient Stated Goals walking better, get mobility back in my arm and leg   Currently in Pain? No/denies  none in UE's              Vestibular Assessment - 01/15/15 0001    Occulomotor Exam   Comment Convergence impaired in L eye. Pt reports diplipia with near vision.                 OT Treatments/Exercises (OP) - 01/15/15 0001    ADLs   ADL Comments Speech therapy referral received - pt to get scheduled for speech evaluation today.  Pt also given contact info for Dr. Frederico Hamman (neuro-opthamalogist) and instructed to call to get eye assessment due to diplopia   Exercises   Exercises Hand   Hand Exercises   Other Hand Exercises Pt issued putty HEP for grip and pinch strength - pt return demo of each (see pt instructions)   Fine Motor Coordination   Other Fine Motor Exercises Pt issued coordination HEP - pt demo each (see pt instructions)                OT Education - 01/15/15 1303    Education provided Yes   Education Details coordination and putty HEP for Lt hand   Person(s) Educated Patient   Methods Explanation;Demonstration;Handout   Comprehension Verbalized understanding;Returned demonstration          OT Short Term Goals - 01/01/15 1347    OT SHORT TERM GOAL #1   Title Independent w/ initial HEP for coordination and grip strength (due 01/25/15)   Time 4   Period Weeks   Status New   OT SHORT TERM GOAL #2   Title Improve LUE function as demo by performing 38 blocks or > on Box & Blocks test   Baseline eval = 31   Time 4   Period Weeks   Status  New   OT SHORT TERM GOAL #3   Title Mod I level with hooking bra, donning socks, and tying shoes with A/E and/or compensatory strategies prn   Time 4   Period Weeks   Status New   OT SHORT TERM GOAL #4   Title Improve grip strength Lt hand to 35 lbs to assist in opening containers   Baseline eval = 20 lbs (Rt = 75 lbs)   Time 4   Period Weeks   Status New   OT SHORT TERM GOAL #5   Title Pt independent with HEP for vision (tacking and convergence)   Time 4   Period Weeks   Status New   OT SHORT TERM GOAL #6   Title Cognition goal TBD    Time 4   Period Weeks   Status Deferred  Requesting speech therapy referral to address secondary to appearing more langauge based. Pt also appears to have decreased processing speed           OT Long Term Goals - 01/01/15 1349    OT LONG TERM GOAL #1   Title Independent w/ updated HEP for LUE  strengthening (due 02/25/15)   Time 8   Period Weeks   Status New   OT LONG TERM GOAL #2   Title Place 3 # object on high shelf LUE 5/5 trials w/o rest   Baseline eval = unable, 3/5 on MMT (NO resistance tolerated)   Time 8   Period Weeks   Status New   OT LONG TERM GOAL #3   Title Improve coordination as evidenced by performing 9 hole peg test in 28 sec. or less   Baseline eval = 36.75 sec. (Rt = 23.35 sec)   Time 8   Period Weeks   Status New   OT LONG TERM GOAL #4   Title Improve grip strength Lt hand to 45 lbs or greater to assist w/ opening containers/jars   Baseline eval = 20 (Rt = 75 lbs)   Time 8   Period Weeks   Status New   OT LONG TERM GOAL #5   Title Pt to return to IADL tasks safely Mod I level (cooking/cleaning)   Time 8   Period Weeks   Status New   OT LONG TERM GOAL #6   Title Updated cognition goal TBD   Time 8   Period Weeks   Status Deferred  speech therapy referral made to address               Plan - 01/15/15 1317    Clinical Impression Statement Pt now has speech therapy evaluation scheduled. Pt also instructed to call neuro-opthamalogist re: assessement of vision after stroke. Pt has visual deficits (diplopia) and cognitive deficits. Pt with improved coordination today as demo by ability to perform HEP    Plan Review coordination/putty HEP prn, issue HEP for visual tracking and convergence/divergence, LUE strengthening as able, make more O.T. appts   Consulted and Agree with Plan of Care Patient        Problem List Patient Active Problem List   Diagnosis Date Noted  . Alteration of sensations, post-stroke 12/22/2014  . Cognitive deficit, post-stroke 11/23/2014  . Hypertension 10/31/2014  . Left hemiparesis 10/23/2014  . Cardiomyopathy due to hypertension 10/23/2014  . Chronic ischemic vertebrobasilar artery thalamic stroke   . Aortic valve vegetation   . Hypertensive heart disease   . Overweight (BMI 25.0-29.9)   . H/O  noncompliance with medical treatment, presenting  hazards to health   . Aortic regurgitation   . Tobacco use disorder 10/07/2014  . Polysubstance abuse 10/07/2014  . Cerebral infarction due to thrombosis of right middle cerebral artery   . Hyperlipidemia   . Embolic stroke involving right middle cerebral artery 10/06/2014    Carey Bullocks, OTR/L 01/15/2015, 1:22 PM  [facility] [address] [place], [place], [address] Phone: [phone]   Fax:  [phone]

## 2015-01-15 NOTE — Patient Instructions (Signed)
  Coordination Activities  Perform the following activities for 15-20 minutes 1-2 times per day with left hand(s).   Rotate ball in fingertips (clockwise and counter-clockwise 5 times each way).  Toss ball in air and catch with the same hand.  Flip cards 1 at a time as fast as you can.  Deal cards with your thumb (Hold deck in hand and push card off top with thumb).  Rotate card in hand (clockwise and counter-clockwise 5 times each way).  Pick up coins one at a time until you get 5-10 in your hand, then move coins from palm to fingertips to stack one at a time.   1. Grip Strengthening (Resistive Putty)   Squeeze putty using thumb and all fingers. Repeat _20___ times. Do __2__ sessions per day.   2. Roll putty into tube on table and pinch between each finger and thumb x 10 reps each. (can do ring and small finger together)

## 2015-01-15 NOTE — Patient Instructions (Signed)
Functional Quadriceps: Sit to Stand   Sit on edge of chair, feet flat on floor. Make sure your feet are even (left foot not too far forward/backwards). Stand up without using your hands. Repeat __10__ times per set. Do _3_ sessions per day.  Abduction: Clam (Eccentric) - Side-Lying   Make sure your left ankle brace is off for this exercise.   Lie on RIGHT side with knees bent. Hold onto the edge of the bed with your top (left) hand to prevent your hips from moving. Lift top (LEFT) knee, keeping feet together. Keep trunk steady. Slowly lower for 2-3 seconds.   Perform 10 repetitions, 3 times per day.   Walking Program  Begin walking in a safe place using your rolling walker and ankle brace. You may choose to walk outside due to the heat/humidity. Try to walk over level ground without obstacles in the way.  Start by walking 4 consecutive minutes, 3 times daily. Add 1 minute per week.

## 2015-01-15 NOTE — Therapy (Signed)
[facility] [address] [place], [place], [address] Phone: [phone]   Fax:  [phone]  Physical Therapy Treatment  Patient Details  Name: Brandy Roberts MRN: [id number] Date of Birth: May 04, 1971 Referring Provider:  Lance Bosch, NP  Encounter Date: 01/15/2015      PT End of Session - 01/15/15 1300    Visit Number 4   Number of Visits 17   Date for PT Re-Evaluation 02/24/15   Authorization Type [employer]   PT Start Time 1201  Pt arrived late to session   PT Stop Time 1230   PT Time Calculation (min) 29 min   Activity Tolerance Patient tolerated treatment well   Behavior During Therapy [facility] for tasks assessed/performed      Past Medical History  Diagnosis Date  . Hypertension   . Stroke   . Hyperlipidemia     Past Surgical History  Procedure Laterality Date  . Cesarean section    . Tonsillectomy    . Ganglion cyst excision    . Tee without cardioversion N/A 10/09/2014    Procedure: TRANSESOPHAGEAL ECHOCARDIOGRAM (TEE);  Surgeon: Sanda Klein, MD;  Location: [facility] ENDOSCOPY;  Service: Cardiovascular;  Laterality: N/A;    There were no vitals filed for this visit.  Visit Diagnosis:  Abnormality of gait  Hemiparesis affecting nondominant side as late effect of cerebrovascular accident  Lack of coordination due to stroke      Subjective Assessment - 01/15/15 1241    Subjective Pt reports ongoing diplopia. Also reports L knee "snapping back" when walking. Also reports feeling "really winded" after walking short distances. Pt states, "This is all new since the stroke."   Pertinent History CVA 10/06/14   Patient Stated Goals Pt's goal for therapy is to get mobility back for better walking.   Currently in Pain? No/denies            [facility] PT Assessment - 01/15/15 0001    Strength   Left Hip ABduction 3-/5            Vestibular Assessment - 01/15/15 0001    Occulomotor Exam   Comment  Convergence impaired in L eye. Pt reports diplipia with near vision.                  [facility] Adult PT Treatment/Exercise - 01/15/15 0001    Transfers   Transfers Sit to Stand;Stand to Sit;Supine to Sit;Sit to Supine   Sit to Stand 5: Supervision   Sit to Stand Details (indicate cue type and reason) cueing for safe use of AD   Stand to Sit 5: Supervision   Stand to Sit Details sit <> stand x10 reps without UE support (added to HEP) for funcitonal LE strengthening. Cueing focused on setup, placement of LLE, anterior weight shift.   Supine to Sit 5: Supervision   Supine to Sit Details (indicate cue type and reason) Increased time required for pt to self-manage LLE using BUE's   Sit to Supine 5: Supervision   Sit to Supine Details  Increased time required for pt to self-manage LLE using BUE's   Ambulation/Gait   Ambulation/Gait Yes   Ambulation/Gait Assistance 4: Min guard;5: Supervision   Ambulation Distance (Feet) 230 Feet   Assistive device Rolling walker   Gait Pattern Step-through pattern;Decreased step length - left;Decreased stance time - left;Decreased step length - right;Decreased dorsiflexion - left;Decreased weight shift to left;Ataxic;Wide base of support;Poor foot clearance - left;Left genu recurvatum  pt not wearing  AFO   Ambulation Surface Level;Indoor   Gait Comments Modified heel wedge in L shoe to prevent L genu recurvatum (effective until pt fatigued). Cueing addressed L pelvic retraction to decrease L genu recurvatum.   Self-Care   Self-Care --   Exercises   Exercises Other Exercises   Other Exercises  L clamshell x10 reps (to pt fatigue) to address L pelvic retraction, L stance stability, and to address L genu recurvatum                PT Education - 01/15/15 1253    Education provided Yes   Education Details HEP. Walking program. Effect of standing posture on L genu recurvatum.   Person(s) Educated Patient   Methods  Explanation;Demonstration;Tactile cues;Handout;Verbal cues   Comprehension Verbalized understanding;Returned demonstration;Need further instruction          PT Short Term Goals - 01/01/15 1751    PT SHORT TERM GOAL #1   Title Pt will perform HEP with family supervision for improved strength, balance, gait.  Target 01/24/15   Baseline No current HEP   Time 4   Period Weeks   Status On-going   PT SHORT TERM GOAL #2   Title Pt will perform at least 8 of 10 reps of sit<>stand transfers with minimal to no UE support for improved transfer efficiency and safety.   Baseline Pt requires supervision and bilateral UE support for sit<>stand transfers.   Time 4   Period Weeks   Status On-going   PT SHORT TERM GOAL #3   Title Pt will improve Berg Balance score to at least 36/56 for decreased fall risk.   Baseline Berg score 31/56 at eval (Scores <45/56 are indicative of increased fall risk.)   Time 4   Period Weeks   Status On-going   PT SHORT TERM GOAL #4   Title Pt will improve TUG score to less than or equal to 30 seconds for decreased fall risk.   Baseline TUG score 36.24 sec at eval; Scores >13.5 sec indicative of increased fall risk; >30 sec indicative of difficulty with ADLs in the home   Time 4   Period Weeks   Status On-going   PT SHORT TERM GOAL #5   Title Pt will verbalize understanding of CVA education   Baseline Dependent   Time 4   Period Weeks   Status On-going           PT Long Term Goals - 01/01/15 1751    PT LONG TERM GOAL #1   Title Pt will verbalize understanding of fall prevention within the home environment.  Target date 02/24/15   Baseline Pt at fall risk per Berg, TUG, gait velocity scores   Time 8   Period Weeks   Status On-going   PT LONG TERM GOAL #2   Title Pt will improve gait velocity to at least 1.8 ft/sec for decreased risk of falls/improved gait efficiency.   Baseline gait velocity 1.07 ft/sec   Time 8   Period Weeks   Status On-going   PT  LONG TERM GOAL #3   Title Pt will improve Berg Balance score to at least 41/56 for decreased fall risk.   Baseline Berg score 31/56 at eval   Time 8   Period Weeks   Status On-going   PT LONG TERM GOAL #4   Title Pt will improve TUG score to less than or equal to 20 seconds for decreased fall risk.   Baseline TUG score 36.24 sec  at eval   Time 8   Period Weeks   Status On-going   PT LONG TERM GOAL #5   Title Pt will ambulate household distances, at least 50-100 ft using cane, modified independently, for improved household gait.   Baseline Gait 50 ft with RW at eval   Time 8   Period Weeks   Status On-going               Plan - 01/15/15 1301    Clinical Impression Statement Session focused on initiating HEP, walking program. Pt continues to demonstrate L genu recurvatum, which appears to be secondary to L pelvic retraction, decreased strength in L hip abductors/extensors. Continue per POC.   Pt will benefit from skilled therapeutic intervention in order to improve on the following deficits Abnormal gait;Decreased balance;Decreased endurance;Decreased safety awareness;Decreased mobility;Decreased strength;Difficulty walking   Rehab Potential Good   PT Frequency 2x / week   PT Duration 8 weeks   PT Treatment/Interventions ADLs/Self Care Home Management;Gait training;Neuromuscular re-education;Balance training;Therapeutic exercise;Therapeutic activities;Functional mobility training;Patient/family education;Orthotic Fit/Training   PT Next Visit Plan Add exercises to HEP. Consider L hip extensor strengthening. Ask about walking program.   Recommended Other Services Pt mentioned smoking cessation. Ask if pt would like PT to contact PCP for further resources.   Consulted and Agree with Plan of Care Patient        Problem List Patient Active Problem List   Diagnosis Date Noted  . Alteration of sensations, post-stroke 12/22/2014  . Cognitive deficit, post-stroke 11/23/2014  .  Hypertension 10/31/2014  . Left hemiparesis 10/23/2014  . Cardiomyopathy due to hypertension 10/23/2014  . Chronic ischemic vertebrobasilar artery thalamic stroke   . Aortic valve vegetation   . Hypertensive heart disease   . Overweight (BMI 25.0-29.9)   . H/O noncompliance with medical treatment, presenting hazards to health   . Aortic regurgitation   . Tobacco use disorder 10/07/2014  . Polysubstance abuse 10/07/2014  . Cerebral infarction due to thrombosis of right middle cerebral artery   . Hyperlipidemia   . Embolic stroke involving right middle cerebral artery 10/06/2014    Billie Ruddy, PT, DPT [facility] [address] [place], [place], [address] Phone: [phone]   Fax:  [phone] 01/15/2015, 1:05 PM

## 2015-01-18 ENCOUNTER — Encounter: Payer: Self-pay | Admitting: Occupational Therapy

## 2015-01-18 ENCOUNTER — Ambulatory Visit: Payer: No Typology Code available for payment source | Admitting: Occupational Therapy

## 2015-01-18 ENCOUNTER — Ambulatory Visit: Payer: No Typology Code available for payment source | Admitting: Physical Therapy

## 2015-01-18 DIAGNOSIS — R270 Ataxia, unspecified: Secondary | ICD-10-CM

## 2015-01-18 DIAGNOSIS — IMO0002 Reserved for concepts with insufficient information to code with codable children: Secondary | ICD-10-CM

## 2015-01-18 DIAGNOSIS — I69859 Hemiplegia and hemiparesis following other cerebrovascular disease affecting unspecified side: Secondary | ICD-10-CM | POA: Diagnosis not present

## 2015-01-18 DIAGNOSIS — H539 Unspecified visual disturbance: Secondary | ICD-10-CM

## 2015-01-18 DIAGNOSIS — I69359 Hemiplegia and hemiparesis following cerebral infarction affecting unspecified side: Secondary | ICD-10-CM

## 2015-01-18 DIAGNOSIS — R29898 Other symptoms and signs involving the musculoskeletal system: Secondary | ICD-10-CM

## 2015-01-18 DIAGNOSIS — R269 Unspecified abnormalities of gait and mobility: Secondary | ICD-10-CM

## 2015-01-18 NOTE — Patient Instructions (Addendum)
Functional Quadriceps: Sit to Stand   Sit on edge of chair, feet flat on floor. Make sure your feet are even (left foot not too far forward/backwards). Stand up without using your hands. Repeat __7_ times per set. Do _2-3_ sessions per day.  Abduction: Clam (Eccentric) - Side-Lying   Make sure your left ankle brace is off for this exercise.   Lie on RIGHT side with knees bent. Hold onto the edge of the bed with your top (left) hand to prevent your hips from moving. Lift top (LEFT) knee, keeping feet together. Keep trunk steady. Slowly lower for 2-3 seconds.   Perform 10 repetitions, 3 times per day.   Walking Program  Begin walking in a safe place using your rolling walker and ankle brace. You may choose to walk outside due to the heat/humidity. Try to walk over level ground without obstacles in the way.  Start by walking 4 consecutive minutes, 3 times daily. Add 1 minute per week.   HIP: Flexors - Supine - LEFT   Lie on edge of low bed. Place left leg off the bed and place LEFT foot flat on the ground with a small lift underneath (large book with blue band underneath. eep right knee straight.. Right knee should be straight. Push LEFT foot into lift (book) to slightly lift L hips from bed. Hold for 1-2 seconds. Do this 12 times. 3 sets per day.  [place]

## 2015-01-18 NOTE — Patient Instructions (Signed)
Diplopia HEP:  Perform at least 3 times per day. Stop if your eye becomes fatigued or hurts and try again later.  1. Hold a small object/card in front of you.  Hold it in the middle at arm's length away.    2. Cover your LEFT eye and look at the object with your RIGHT eye.  3. Slowly move the object side to side in front of you while continuing to watch it with your RIGHT eye.  4.  Remember to keep your head still and only move your eye.  5.  Repeat 5-10 times.  6.  Then, move object up and down while watching it 5-10 times.  7. Cover your RIGHT eye and look at the object with your LEFT eye while you repeat #1-6 above.  8.  Now, uncover both eyes and try to focus on the object while holding it in the middle.  Try to make it 1 image.   9.  If you can, try to hold it for 10-30 sec increasing as able.    10.  Once you can make the image 1 for at least 30 sec in the middle, repeat #1-6 above with both eyes moving slowly and only in the range that you can keep the image 1.  

## 2015-01-18 NOTE — Therapy (Signed)
[facility] [address] [place], [place], [address] Phone: [phone]   Fax:  [phone]  Occupational Therapy Treatment  Patient Details  Name: Brandy Roberts MRN: [id number] Date of Birth: 27-Jan-1971 Referring Provider:  Lance Bosch, NP  Encounter Date: 01/18/2015      OT End of Session - 01/18/15 1244    Visit Number 5   Number of Visits 17   Date for OT Re-Evaluation 02/25/15   Authorization Type [employer]   Authorization Time Period week 3/8   OT Start Time 1145   OT Stop Time 1230   OT Time Calculation (min) 45 min   Activity Tolerance Patient tolerated treatment well      Past Medical History  Diagnosis Date  . Hypertension   . Stroke   . Hyperlipidemia     Past Surgical History  Procedure Laterality Date  . Cesarean section    . Tonsillectomy    . Ganglion cyst excision    . Tee without cardioversion N/A 10/09/2014    Procedure: TRANSESOPHAGEAL ECHOCARDIOGRAM (TEE);  Surgeon: Sanda Klein, MD;  Location: [facility] ENDOSCOPY;  Service: Cardiovascular;  Laterality: N/A;    There were no vitals filed for this visit.  Visit Diagnosis:  Lack of coordination due to stroke  Left hand weakness  Visual disturbance  Left arm weakness      Subjective Assessment - 01/18/15 1156    Subjective  I called and left a message for the neuro opthamalogist, but haven't heard back   Pertinent History See EPIC snapshot   Patient Stated Goals walking better, get mobility back in my arm and leg   Currently in Pain? No/denies                      OT Treatments/Exercises (OP) - 01/18/15 0001    ADLs   ADL Comments Briefly reviewed coordination and putty HEP. Pt verbalized understanding   Exercises   Exercises Shoulder   Shoulder Exercises: ROM/Strengthening   Other ROM/Strengthening Exercises Attempted to perform shoulder flexion, extension and horizontal abduction with yellow theraband,  but pt required min to mod assist and cues to perform correctly. Pt then shown shoulder flexion and abduction with 1 lb. dumbbell and performed with greater accuracy x 10 reps.    Visual/Perceptual Exercises   Other Exercises Pt issued visual HEP for diplopia and therapist demo each exercise. Pt verbalized understanding. (see pt instructions)                OT Education - 01/18/15 1203    Education provided Yes   Education Details Visual HEP   Person(s) Educated Patient   Methods Explanation;Demonstration;Handout   Comprehension Verbalized understanding;Returned demonstration          OT Short Term Goals - 01/18/15 1245    OT SHORT TERM GOAL #1   Title Independent w/ initial HEP for coordination and grip strength (due 01/25/15)   Time 4   Period Weeks   Status Achieved   OT SHORT TERM GOAL #2   Title Improve LUE function as demo by performing 38 blocks or > on Box & Blocks test   Baseline eval = 31   Time 4   Period Weeks   Status New   OT SHORT TERM GOAL #3   Title Mod I level with hooking bra, donning socks, and tying shoes with A/E and/or compensatory strategies prn   Time 4   Period Weeks   Status New  OT SHORT TERM GOAL #4   Title Improve grip strength Lt hand to 35 lbs to assist in opening containers   Baseline eval = 20 lbs (Rt = 75 lbs)   Time 4   Period Weeks   Status New   OT SHORT TERM GOAL #5   Title Pt independent with HEP for vision (tacking and convergence)   Time 4   Period Weeks   Status Achieved   OT SHORT TERM GOAL #6   Title Cognition goal TBD    Time 4   Period Weeks   Status Deferred  Requesting speech therapy referral to address secondary to appearing more langauge based. Pt also appears to have decreased processing speed           OT Long Term Goals - 01/01/15 1349    OT LONG TERM GOAL #1   Title Independent w/ updated HEP for LUE strengthening (due 02/25/15)   Time 8   Period Weeks   Status New   OT LONG TERM GOAL #2    Title Place 3 # object on high shelf LUE 5/5 trials w/o rest   Baseline eval = unable, 3/5 on MMT (NO resistance tolerated)   Time 8   Period Weeks   Status New   OT LONG TERM GOAL #3   Title Improve coordination as evidenced by performing 9 hole peg test in 28 sec. or less   Baseline eval = 36.75 sec. (Rt = 23.35 sec)   Time 8   Period Weeks   Status New   OT LONG TERM GOAL #4   Title Improve grip strength Lt hand to 45 lbs or greater to assist w/ opening containers/jars   Baseline eval = 20 (Rt = 75 lbs)   Time 8   Period Weeks   Status New   OT LONG TERM GOAL #5   Title Pt to return to IADL tasks safely Mod I level (cooking/cleaning)   Time 8   Period Weeks   Status New   OT LONG TERM GOAL #6   Title Updated cognition goal TBD   Time 8   Period Weeks   Status Deferred  speech therapy referral made to address               Plan - 01/18/15 1246    Clinical Impression Statement pt met STG's #1 and #5. Pt demo severly decreased strength LUE today.    Plan Issue HEP for LUE with 1 lb. weight, review visual HEP prn   OT Home Exercise Plan 01/15/15: Coordination and putty HEP. 01/18/15: Visual HEP    Consulted and Agree with Plan of Care Patient        Problem List Patient Active Problem List   Diagnosis Date Noted  . Alteration of sensations, post-stroke 12/22/2014  . Cognitive deficit, post-stroke 11/23/2014  . Hypertension 10/31/2014  . Left hemiparesis 10/23/2014  . Cardiomyopathy due to hypertension 10/23/2014  . Chronic ischemic vertebrobasilar artery thalamic stroke   . Aortic valve vegetation   . Hypertensive heart disease   . Overweight (BMI 25.0-29.9)   . H/O noncompliance with medical treatment, presenting hazards to health   . Aortic regurgitation   . Tobacco use disorder 10/07/2014  . Polysubstance abuse 10/07/2014  . Cerebral infarction due to thrombosis of right middle cerebral artery   . Hyperlipidemia   . Embolic stroke involving right  middle cerebral artery 10/06/2014    Carey Bullocks, OTR/L 01/18/2015, 12:51 PM  [place]  [facility] [address] [place], [place], [address] Phone: [phone]   Fax:  [phone]

## 2015-01-18 NOTE — Therapy (Signed)
[facility] [address] [place], [place], [address] Phone: [phone]   Fax:  [phone]  Physical Therapy Treatment  Patient Details  Name: Brandy Roberts MRN: [id number] Date of Birth: [birth year] Referring Provider:  Lance Bosch, NP  Encounter Date: 01/18/2015      PT End of Session - 01/18/15 1331    Visit Number 5   Number of Visits 17   Date for PT Re-Evaluation 02/24/15   Authorization Type [employer]   PT Start Time 1233  Finishing up with OT   PT Stop Time 1316   PT Time Calculation (min) 43 min   Equipment Utilized During Treatment Gait belt   Activity Tolerance Patient tolerated treatment well   Behavior During Therapy [facility] for tasks assessed/performed      Past Medical History  Diagnosis Date  . Hypertension   . Stroke   . Hyperlipidemia     Past Surgical History  Procedure Laterality Date  . Cesarean section    . Tonsillectomy    . Ganglion cyst excision    . Tee without cardioversion N/A 10/09/2014    Procedure: TRANSESOPHAGEAL ECHOCARDIOGRAM (TEE);  Surgeon: Sanda Klein, MD;  Location: [facility] ENDOSCOPY;  Service: Cardiovascular;  Laterality: N/A;    There were no vitals filed for this visit.  Visit Diagnosis:  Abnormality of gait  Hemiparesis affecting nondominant side as late effect of cerebrovascular accident  Ataxia  Lack of coordination due to stroke      Subjective Assessment - 01/18/15 1318    Subjective Pt has been ytrying to perform home exercises. Reports she has been performing walking program in home due to heat/humidity.   Pertinent History CVA 10/06/14   Patient Stated Goals Pt's goal for therapy is to get mobility back for better walking.   Currently in Pain? No/denies                         [facility] Adult PT Treatment/Exercise - 01/18/15 1333    Bed Mobility   Bed Mobility Supine to Sit;Sit to Supine   Supine to Sit 5: Supervision   Supine to  Sit Details (indicate cue type and reason) Cueing for logroll technique for increased efficiency of movement   Sit to Supine 5: Supervision   Sit to Supine - Details (indicate cue type and reason) cueing for LLE management using RLE   Transfers   Transfers Sit to Stand;Stand to Sit;Supine to Sit;Sit to Supine   Sit to Stand 5: Supervision   Stand to Sit 5: Supervision   Supine to Sit 5: Supervision   Sit to Supine 5: Supervision   Ambulation/Gait   Ambulation/Gait Yes   Ambulation/Gait Assistance 4: Min guard;5: Supervision;4: Min assist   Ambulation/Gait Assistance Details Min A. Supervision required  for gait with RW   Ambulation Distance (Feet) 460 Feet   Assistive device Rolling walker;None   Gait Pattern Step-through pattern;Decreased stance time - left;Decreased step length - right;Decreased dorsiflexion - left;Decreased weight shift to left;Ataxic;Poor foot clearance - left;Left genu recurvatum  pt not wearing AFO   Ambulation Surface Level;Indoor   Gait Comments During gait x175' without AD, cueing focused on upright posture, L stance stability, increased anterior/lateral weight shift during L stance phase. Tactile cueing at L gluteus max/medius to address pelvic retraction.   Neuro Re-ed    Neuro Re-ed Details  Tall kneeling performed to address LE ataxia, promote selective control in LLE, and increase proximal stability.  Pt performed forward/retro advancement with RLE with cueing to maintain L stance stability; prgressed to lateral strepping 2 x 3 steps per direction with cueing as described. Tall kneeling squats x 5 reps.   Exercises   Exercises Other Exercises   Other Exercises  Reviewed HEP. Modified reps for sit <> stand; added instructions for logroll technique with supine > sit for increased efficiency of movement with supine > sit. Added exercise for L gluteus max/medius activation, iselective control of L hip extension/knee flexion. See Pt Instructions for details.                 PT Education - 01/18/15 1324    Education provided Yes   Education Details Modified/progressed HEP.   Person(s) Educated Patient   Methods Explanation;Demonstration;Tactile cues;Handout;Verbal cues   Comprehension Verbalized understanding;Returned demonstration;Need further instruction          PT Short Term Goals - 01/01/15 1751    PT SHORT TERM GOAL #1   Title Pt will perform HEP with family supervision for improved strength, balance, gait.  Target 01/24/15   Baseline No current HEP   Time 4   Period Weeks   Status On-going   PT SHORT TERM GOAL #2   Title Pt will perform at least 8 of 10 reps of sit<>stand transfers with minimal to no UE support for improved transfer efficiency and safety.   Baseline Pt requires supervision and bilateral UE support for sit<>stand transfers.   Time 4   Period Weeks   Status On-going   PT SHORT TERM GOAL #3   Title Pt will improve Berg Balance score to at least 36/56 for decreased fall risk.   Baseline Berg score 31/56 at eval (Scores <45/56 are indicative of increased fall risk.)   Time 4   Period Weeks   Status On-going   PT SHORT TERM GOAL #4   Title Pt will improve TUG score to less than or equal to 30 seconds for decreased fall risk.   Baseline TUG score 36.24 sec at eval; Scores >13.5 sec indicative of increased fall risk; >30 sec indicative of difficulty with ADLs in the home   Time 4   Period Weeks   Status On-going   PT SHORT TERM GOAL #5   Title Pt will verbalize understanding of CVA education   Baseline Dependent   Time 4   Period Weeks   Status On-going           PT Long Term Goals - 01/01/15 1751    PT LONG TERM GOAL #1   Title Pt will verbalize understanding of fall prevention within the home environment.  Target date 02/24/15   Baseline Pt at fall risk per Berg, TUG, gait velocity scores   Time 8   Period Weeks   Status On-going   PT LONG TERM GOAL #2   Title Pt will improve gait velocity to  at least 1.8 ft/sec for decreased risk of falls/improved gait efficiency.   Baseline gait velocity 1.07 ft/sec   Time 8   Period Weeks   Status On-going   PT LONG TERM GOAL #3   Title Pt will improve Berg Balance score to at least 41/56 for decreased fall risk.   Baseline Berg score 31/56 at eval   Time 8   Period Weeks   Status On-going   PT LONG TERM GOAL #4   Title Pt will improve TUG score to less than or equal to 20 seconds for decreased fall risk.  Baseline TUG score 36.24 sec at eval   Time 8   Period Weeks   Status On-going   PT LONG TERM GOAL #5   Title Pt will ambulate household distances, at least 50-100 ft using cane, modified independently, for improved household gait.   Baseline Gait 50 ft with RW at eval   Time 8   Period Weeks   Status On-going               Plan - 01/18/15 1354    Clinical Impression Statement Session focused on increasing efficiency of bed mobility, addressing L hip retraction/genu recurvatum. Modified HEP to further address proximal strength/stability. Pt will continue to benefit from skilled outpatient PT to increase stability/independence with functional mobility and decrease fall risk.   Pt will benefit from skilled therapeutic intervention in order to improve on the following deficits Abnormal gait;Decreased balance;Decreased endurance;Decreased safety awareness;Decreased mobility;Decreased strength;Difficulty walking   Rehab Potential Good   PT Frequency 2x / week   PT Duration 8 weeks   PT Treatment/Interventions ADLs/Self Care Home Management;Gait training;Neuromuscular re-education;Balance training;Therapeutic exercise;Therapeutic activities;Functional mobility training;Patient/family education;Orthotic Fit/Training   PT Next Visit Plan closed-chain/WB activities to address ataxia, coordination, LLE weakness   Consulted and Agree with Plan of Care Patient        Problem List Patient Active Problem List   Diagnosis Date  Noted  . Alteration of sensations, post-stroke 12/22/2014  . Cognitive deficit, post-stroke 11/23/2014  . Hypertension 10/31/2014  . Left hemiparesis 10/23/2014  . Cardiomyopathy due to hypertension 10/23/2014  . Chronic ischemic vertebrobasilar artery thalamic stroke   . Aortic valve vegetation   . Hypertensive heart disease   . Overweight (BMI 25.0-29.9)   . H/O noncompliance with medical treatment, presenting hazards to health   . Aortic regurgitation   . Tobacco use disorder 10/07/2014  . Polysubstance abuse 10/07/2014  . Cerebral infarction due to thrombosis of right middle cerebral artery   . Hyperlipidemia   . Embolic stroke involving right middle cerebral artery 10/06/2014    Billie Ruddy, PT, DPT [facility] [address] [place], [place], [address] Phone: [phone]   Fax:  [phone] 01/18/2015, 3:44 PM

## 2015-01-22 ENCOUNTER — Encounter: Payer: Self-pay | Admitting: Occupational Therapy

## 2015-01-22 ENCOUNTER — Ambulatory Visit: Payer: No Typology Code available for payment source | Admitting: Physical Therapy

## 2015-01-22 ENCOUNTER — Ambulatory Visit: Payer: No Typology Code available for payment source | Admitting: Occupational Therapy

## 2015-01-22 DIAGNOSIS — H539 Unspecified visual disturbance: Secondary | ICD-10-CM

## 2015-01-22 DIAGNOSIS — I69859 Hemiplegia and hemiparesis following other cerebrovascular disease affecting unspecified side: Secondary | ICD-10-CM | POA: Diagnosis not present

## 2015-01-22 DIAGNOSIS — R29898 Other symptoms and signs involving the musculoskeletal system: Secondary | ICD-10-CM

## 2015-01-22 NOTE — Patient Instructions (Signed)
Flexion Lift With Soup Can   With a can in Lt hand, raise arms out to front and over head in a pain free range. Keep posture upright, do not lean! Hold __3__ seconds. Repeat _10___ times. Do __2__ sessions per day.   Abduction Lift With Soup Can   With a can in each hand, raise Lt arm out to the sides and over head in a pain free range. Do not lean, keep posture upright Hold _3___ seconds. Repeat _10___ times. Do __2__ sessions per day.  Elbow Extension: Resisted   Lie on back, __1__ pound weight in right hand, arm up, elbow bent and supported. Straighten elbow. Return slowly. Repeat _10___ times per set. Do _1___ sets per session. Do __2__ sessions per day.  Extension - Prone (Dumbbell)   Lie with left arm hanging off side of bed. Lift hand back and up. (Can do standing with hips bent at 90 degrees, hold onto counter with Rt hand) Repeat __10__ times per set. Do _1___ sets per session. Do __2__ sessions per day. Use __1__ lb weight.   Copyright  VHI. All rights reserved.
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
## 2015-01-23 ENCOUNTER — Encounter: Payer: Self-pay | Admitting: Physical Medicine & Rehabilitation

## 2015-01-23 ENCOUNTER — Ambulatory Visit (HOSPITAL_BASED_OUTPATIENT_CLINIC_OR_DEPARTMENT_OTHER): Payer: No Typology Code available for payment source | Admitting: Physical Medicine & Rehabilitation

## 2015-01-23 ENCOUNTER — Encounter: Payer: No Typology Code available for payment source | Attending: Physical Medicine & Rehabilitation

## 2015-01-23 VITALS — BP 130/47 | HR 89 | Resp 14

## 2015-01-23 DIAGNOSIS — I6930 Unspecified sequelae of cerebral infarction: Secondary | ICD-10-CM

## 2015-01-23 DIAGNOSIS — G8190 Hemiplegia, unspecified affecting unspecified side: Secondary | ICD-10-CM

## 2015-01-23 DIAGNOSIS — I63431 Cerebral infarction due to embolism of right posterior cerebral artery: Secondary | ICD-10-CM | POA: Diagnosis not present

## 2015-01-23 DIAGNOSIS — I10 Essential (primary) hypertension: Secondary | ICD-10-CM | POA: Diagnosis present

## 2015-01-23 DIAGNOSIS — G8194 Hemiplegia, unspecified affecting left nondominant side: Secondary | ICD-10-CM

## 2015-01-23 DIAGNOSIS — R270 Ataxia, unspecified: Secondary | ICD-10-CM | POA: Insufficient documentation

## 2015-01-23 DIAGNOSIS — R208 Other disturbances of skin sensation: Secondary | ICD-10-CM

## 2015-01-23 DIAGNOSIS — R209 Unspecified disturbances of skin sensation: Secondary | ICD-10-CM

## 2015-01-23 DIAGNOSIS — Z8673 Personal history of transient ischemic attack (TIA), and cerebral infarction without residual deficits: Secondary | ICD-10-CM | POA: Diagnosis not present

## 2015-01-23 DIAGNOSIS — I69898 Other sequelae of other cerebrovascular disease: Secondary | ICD-10-CM | POA: Diagnosis not present

## 2015-01-23 DIAGNOSIS — I6931 Cognitive deficits following cerebral infarction: Secondary | ICD-10-CM

## 2015-01-23 DIAGNOSIS — I69398 Other sequelae of cerebral infarction: Secondary | ICD-10-CM

## 2015-01-23 DIAGNOSIS — I69319 Unspecified symptoms and signs involving cognitive functions following cerebral infarction: Secondary | ICD-10-CM

## 2015-01-23 MED ORDER — GABAPENTIN 300 MG PO CAPS: 300.0000 mg | ORAL_CAPSULE | Freq: Every day | ORAL | Status: DC

## 2015-01-23 NOTE — Progress Notes (Signed)
Subjective:    Patient ID: Brandy Roberts, female    DOB: 1971/01/30, 44 y.o.   MRN: [id number]  HPI Patient is attending outpatient PT and OT. Speech therapy evaluation is scheduled but has not been completed yet. Has been  Working with the neuro ophthalmologist Dr. Frederico Hamman on lenses to help correct her vision which is related to her stroke. Patient is independent with dressing and bathing. Still uses a walker as well as a left ankle foot orthosis to ambulate. Has an appointment with neurologist tomorrow. Has therapy scheduled for about another month. Left leg pain continues mainly at night most the pain is from the knee toward the foot. Tingling type of pain.  Pain Inventory Average Pain 7 Pain Right Now 5 My pain is tingling  In the last 24 hours, has pain interfered with the following? General activity 4 Relation with others 3 Enjoyment of life 8 What TIME of day is your pain at its worst? evening Sleep (in general) Good  Pain is worse with: walking Pain improves with: therapy/exercise and medication Relief from Meds: 6  Mobility walk with assistance use a walker ability to climb steps?  yes do you drive?  no Do you have any goals in this area?  yes  Function not employed: date last employed .  Neuro/Psych weakness numbness trouble walking confusion depression  Prior Studies Any changes since last visit?  no  Physicians involved in your care Any changes since last visit?  no   Family History  Problem Relation Age of Onset  . Cancer Mother   . Hypertension Mother   . Hypertension Sister    Social History   Social History  . Marital Status: Single    Spouse Name: N/A  . Number of Children: N/A  . Years of Education: N/A   Social History Main Topics  . Smoking status: Former Smoker    Quit date: 11/01/2014  . Smokeless tobacco: Never Used  . Alcohol Use: No  . Drug Use: None  . Sexual Activity: Not Asked   Other Topics Concern  . None    Social History Narrative   Works as a Secretary/administrator at the [employer]   Past Surgical History  Procedure Laterality Date  . Cesarean section    . Tonsillectomy    . Ganglion cyst excision    . Tee without cardioversion N/A 10/09/2014    Procedure: TRANSESOPHAGEAL ECHOCARDIOGRAM (TEE);  Surgeon: Sanda Klein, MD;  Location: [facility] ENDOSCOPY;  Service: Cardiovascular;  Laterality: N/A;   Past Medical History  Diagnosis Date  . Hypertension   . Stroke   . Hyperlipidemia    BP 130/47 mmHg  Pulse 89  Resp 14  SpO2 100%  Opioid Risk Score:   Fall Risk Score:  `1  Depression screen PHQ 2/9  Depression screen [facility] 2/9 11/30/2014 11/23/2014 10/31/2014 10/23/2014  Decreased Interest 3 2 0 2  Down, Depressed, Hopeless - 3 0 1  PHQ - 2 Score 3 5 0 3  Altered sleeping 3 2 - 3  Tired, decreased energy 3 3 - 3  Change in appetite - 1 - 1  Feeling bad or failure about yourself  0 0 - 2  Trouble concentrating 1 2 - 0  Moving slowly or fidgety/restless - 0 - 1  Suicidal thoughts - 0 - 0  PHQ-9 Score 10 13 - 13     Review of Systems  Musculoskeletal: Positive for gait problem.  Neurological: Positive for weakness and numbness.  Psychiatric/Behavioral: Positive for confusion and dysphoric mood.  All other systems reviewed and are negative.      Objective:   Physical Exam Motor strength is 4/5 in the left deltoid, biceps, triceps, grip 3/5 in the left hip flexor, knee extensor, ankle dorsiflexor and plantar flexor Sensation is reduced to light touch in the left upper as well as left lower extremity Ambulates with a walker Speech has mild dysarthria Memory for short-term events is reduced. Husband needed to talk about her ophthalmology visit today.        Assessment & Plan:  1. Right posterior cerebral artery distribution infarct with left hemiparesis as well as left hemisensory deficits. Patient requires assistance for ambulation. She has cognitive deficits related to her CVA.  I do not think she is ready to return to work at the current time. I have written a letter to this effect. We'll reassess in 1 month.

## 2015-01-23 NOTE — Patient Instructions (Addendum)
New medicine is called gabapentin. It is for nerve pain. It will started just at nighttime. If we need to we can give daytime doses as well.

## 2015-01-24 ENCOUNTER — Telehealth: Payer: Self-pay | Admitting: Neurology

## 2015-01-24 ENCOUNTER — Encounter: Payer: Self-pay | Admitting: Occupational Therapy

## 2015-01-24 ENCOUNTER — Ambulatory Visit: Payer: No Typology Code available for payment source | Admitting: Physical Therapy

## 2015-01-24 ENCOUNTER — Telehealth (HOSPITAL_COMMUNITY): Payer: Self-pay | Admitting: *Deleted

## 2015-01-24 ENCOUNTER — Ambulatory Visit (INDEPENDENT_AMBULATORY_CARE_PROVIDER_SITE_OTHER): Payer: No Typology Code available for payment source | Admitting: Neurology

## 2015-01-24 ENCOUNTER — Encounter: Payer: Self-pay | Admitting: Neurology

## 2015-01-24 ENCOUNTER — Ambulatory Visit: Payer: No Typology Code available for payment source | Admitting: Occupational Therapy

## 2015-01-24 VITALS — BP 127/63 | HR 98 | Ht 66.0 in | Wt 182.4 lb

## 2015-01-24 DIAGNOSIS — I63411 Cerebral infarction due to embolism of right middle cerebral artery: Secondary | ICD-10-CM | POA: Diagnosis not present

## 2015-01-24 DIAGNOSIS — Z87891 Personal history of nicotine dependence: Secondary | ICD-10-CM | POA: Diagnosis not present

## 2015-01-24 DIAGNOSIS — I10 Essential (primary) hypertension: Secondary | ICD-10-CM

## 2015-01-24 DIAGNOSIS — R002 Palpitations: Secondary | ICD-10-CM | POA: Diagnosis not present

## 2015-01-24 DIAGNOSIS — I69359 Hemiplegia and hemiparesis following cerebral infarction affecting unspecified side: Secondary | ICD-10-CM

## 2015-01-24 DIAGNOSIS — IMO0002 Reserved for concepts with insufficient information to code with codable children: Secondary | ICD-10-CM

## 2015-01-24 DIAGNOSIS — I429 Cardiomyopathy, unspecified: Secondary | ICD-10-CM | POA: Diagnosis not present

## 2015-01-24 DIAGNOSIS — R29898 Other symptoms and signs involving the musculoskeletal system: Secondary | ICD-10-CM

## 2015-01-24 DIAGNOSIS — I69859 Hemiplegia and hemiparesis following other cerebrovascular disease affecting unspecified side: Secondary | ICD-10-CM | POA: Diagnosis not present

## 2015-01-24 DIAGNOSIS — R269 Unspecified abnormalities of gait and mobility: Secondary | ICD-10-CM

## 2015-01-24 HISTORY — DX: Personal history of nicotine dependence: Z87.891

## 2015-01-24 NOTE — Therapy (Signed)
[facility] [address] [place], [place], [address] Phone: [phone]   Fax:  [phone]  Physical Therapy Treatment  Patient Details  Name: Brandy Roberts MRN: [id number] Date of Birth: 02/20/1971 Referring Provider:  Keck, Valerie A, NP  Encounter Date: 01/24/2015      PT End of Session - 01/24/15 1928    Visit Number 6   Number of Visits 17   Date for PT Re-Evaluation 02/24/15   Authorization Type [employer]   PT Start Time 1149   PT Stop Time 1230   PT Time Calculation (min) 41 min   Activity Tolerance Patient tolerated treatment well   Behavior During Therapy WFL for tasks assessed/performed      Past Medical History  Diagnosis Date  . Hypertension   . Stroke   . Hyperlipidemia     Past Surgical History  Procedure Laterality Date  . Cesarean section    . Tonsillectomy    . Ganglion cyst excision    . Tee without cardioversion N/A 10/09/2014    Procedure: TRANSESOPHAGEAL ECHOCARDIOGRAM (TEE);  Surgeon: Mihai Croitoru, MD;  Location: [facility];  Service: Cardiovascular;  Laterality: N/A;    There were no vitals filed for this visit.  Visit Diagnosis:  Abnormality of gait  Hemiparesis affecting nondominant side as late effect of cerebrovascular accident      Subjective Assessment - 01/24/15 1155    Subjective Pt reports 8/10 subjective dizziness at baseline. Diplopia in L visual field (near and far vision). Patient to see neuro opthamologist, per Dr. Kirstein's note. Denies falls.   Pertinent History CVA 10/06/14   Patient Stated Goals Pt's goal for therapy is to get mobility back for better walking.   Currently in Pain? No/denies                         [facility] Adult PT Treatment/Exercise - 01/24/15 0001    Ambulation/Gait   Ambulation/Gait Yes   Ambulation/Gait Assistance 6: Modified independent (Device/Increase time);4: Min guard;4: Min assist   Ambulation/Gait Assistance  Details Mod I for gait over level, indoor surfaces with RW and AFO. Min guard- Min A for gait without AD   Ambulation Distance (Feet) 160 Feet   Assistive device Rolling walker;None   Gait Pattern Step-through pattern;Decreased arm swing - right;Decreased arm swing - left;Decreased stance time - left;Decreased hip/knee flexion - left;Decreased weight shift to left;Left genu recurvatum;Poor foot clearance - left;Trunk rotated posteriorly on left;Decreased trunk rotation   Ambulation Surface Level;Indoor   Standardized Balance Assessment   Standardized Balance Assessment Berg Balance Test;Timed Up and Go Test   Berg Balance Test   Sit to Stand Able to stand without using hands and stabilize independently   Standing Unsupported Able to stand safely 2 minutes   Sitting with Back Unsupported but Feet Supported on Floor or Stool Able to sit safely and securely 2 minutes   Stand to Sit Sits safely with minimal use of hands   Transfers Able to transfer safely, definite need of hands   Standing Unsupported with Eyes Closed Able to stand 10 seconds with supervision   Standing Ubsupported with Feet Together Able to place feet together independently and stand for 1 minute with supervision   From Standing Position, Turn to Look Behind Over each Shoulder Looks behind one side only/other side shows less weight shift  weight shift on R > L   Turn 360 Degrees Needs close supervision or verbal cueing    10 sec to R; 12.5 sec to L   Timed Up and Go Test   TUG Normal TUG   Normal TUG (seconds) 27.38   Neuro Re-ed    Neuro Re-ed Details  Tall kneeling and quadruped performed to improve coordination, promote selective control in LLE, and increase proximal stability. In tall kneeling, pt perfformed LUE then RUE reaching (across midline for increased trunk rotation, then laterally) for increased proximal stability with functional weight shifting, reaching outside BOS. Progressed to quadruped alternating reciprocal  UE/LE elevation x5 reps, x4 reps with manual facilitation of full L hip extension. Performed TUG and initiated Berg (not completed due to time constraint). See outcome measures for detailed findings.                  PT Short Term Goals - 01/24/15 1214    PT SHORT TERM GOAL #1   Title Pt will perform HEP with family supervision for improved strength, balance, gait.  Target 01/24/15   Baseline No current HEP   Time 4   Period Weeks   Status On-going   PT SHORT TERM GOAL #2   Title Pt will perform at least 8 of 10 reps of sit<>stand transfers with minimal to no UE support for improved transfer efficiency and safety.   Baseline 8/24: required (S)/cueing for initial 2 sit>stands; mod I for subsequent 8 reps   Time 4   Period Weeks   Status Achieved   PT SHORT TERM GOAL #3   Title Pt will improve Berg Balance score to at least 36/56 for decreased fall risk.   Baseline Berg score 31/56 at eval (Scores <45/56 are indicative of increased fall risk.)   Time 4   Period Weeks   Status On-going   PT SHORT TERM GOAL #4   Title Pt will improve TUG score to less than or equal to 30 seconds for decreased fall risk.   Baseline Achieved 8/24. TUG = 27.38 (avg of 3 trials) using RW.   Time 4   Period Weeks   Status Achieved   PT SHORT TERM GOAL #5   Title Pt will verbalize understanding of CVA education   Baseline Dependent   Time 4   Period Weeks   Status On-going           PT Long Term Goals - 01/01/15 1751    PT LONG TERM GOAL #1   Title Pt will verbalize understanding of fall prevention within the home environment.  Target date 02/24/15   Baseline Pt at fall risk per Berg, TUG, gait velocity scores   Time 8   Period Weeks   Status On-going   PT LONG TERM GOAL #2   Title Pt will improve gait velocity to at least 1.8 ft/sec for decreased risk of falls/improved gait efficiency.   Baseline gait velocity 1.07 ft/sec   Time 8   Period Weeks   Status On-going   PT LONG TERM  GOAL #3   Title Pt will improve Berg Balance score to at least 41/56 for decreased fall risk.   Baseline Berg score 31/56 at eval   Time 8   Period Weeks   Status On-going   PT LONG TERM GOAL #4   Title Pt will improve TUG score to less than or equal to 20 seconds for decreased fall risk.   Baseline TUG score 36.24 sec at eval   Time 8   Period Weeks   Status On-going   PT LONG TERM GOAL #  5   Title Pt will ambulate household distances, at least 50-100 ft using cane, modified independently, for improved household gait.   Baseline Gait 50 ft with RW at eval   Time 8   Period Weeks   Status On-going               Plan - 01/24/15 1939    Clinical Impression Statement Pt met STG for TUG, suggesting increased efficiency of ambulation. Berg not completed due to time constraint. Gait pattern continues to be characterized by rigid, guarded movement, minimal trunk rotation/reciprocal arm swing, and decreased LLE clearance. Continue per POC.   Pt will benefit from skilled therapeutic intervention in order to improve on the following deficits Abnormal gait;Decreased balance;Decreased endurance;Decreased safety awareness;Decreased mobility;Decreased strength;Difficulty walking   Rehab Potential Good   PT Frequency 2x / week   PT Duration 8 weeks   PT Treatment/Interventions ADLs/Self Care Home Management;Gait training;Neuromuscular re-education;Balance training;Therapeutic exercise;Therapeutic activities;Functional mobility training;Patient/family education;Orthotic Fit/Training   PT Next Visit Plan Finish checking STG's Merrilee Jansky initiated but not finished at last session). Increase trunk rotation, LLE clearance during gait; closed chain activities for increased LLE proprioceptive input, proximal stability/control   Consulted and Agree with Plan of Care Patient        Problem List Patient Active Problem List   Diagnosis Date Noted  . Palpitations 01/24/2015  . Essential hypertension  01/24/2015  . Former smoker 01/24/2015  . Cardiomyopathy 01/24/2015  . Alteration of sensations, post-stroke 12/22/2014  . Cognitive deficit, post-stroke 11/23/2014  . Hypertension 10/31/2014  . Left hemiparesis 10/23/2014  . Cardiomyopathy due to hypertension 10/23/2014  . Chronic ischemic vertebrobasilar artery thalamic stroke   . Aortic valve vegetation   . Hypertensive heart disease   . Overweight (BMI 25.0-29.9)   . H/O noncompliance with medical treatment, presenting hazards to health   . Aortic regurgitation   . Tobacco use disorder 10/07/2014  . Polysubstance abuse 10/07/2014  . Cerebral infarction due to thrombosis of right middle cerebral artery   . Hyperlipidemia   . Embolic stroke involving right middle cerebral artery 10/06/2014    Billie Ruddy, PT, DPT [facility] [address] [place], [place], [address] Phone: [phone]   Fax:  [phone] 01/24/2015, 7:46 PM

## 2015-01-24 NOTE — Telephone Encounter (Signed)
Called pt to tell her to stop baby ASA and continue plavix and lipitor for stroke prevention. She has been on ASA and plavix for more than 3 months now and we would like to d/c ASA and just take plavix monotherapy for stroke prevention. However, she was not available for the phone call and her VM has not been set up.   Hi, Katrina, could you please call her and let her know the above message? Thanks a lot.  Rosalin Hawking, MD PhD Stroke Neurology 01/24/2015 6:18 PM

## 2015-01-24 NOTE — Patient Instructions (Addendum)
-   continue plavix and lipitor for stroke prevention - d/c ASA 81mg  - continue to abstain from smoking - check BP at home and record - Follow up with your primary care physician for stroke risk factor modification. Recommend maintain blood pressure goal <130/80, diabetes with hemoglobin A1c goal below 6.5% and lipids with LDL cholesterol goal below 70 mg/dL.  - will repeat 2D echo to evaluate heart function - TCD emboli detection and 30 day cardiac event monitoring - continue PT/OT and follow up with Dr. Read Drivers - follow up in 3 months.

## 2015-01-24 NOTE — Progress Notes (Addendum)
STROKE NEUROLOGY FOLLOW UP NOTE  NAME: Brandy Roberts DOB: 02/27/1971  REASON FOR VISIT: stroke follow up HISTORY FROM: pt and chart  Today we had the pleasure of seeing Brandy Roberts in follow-up at our Neurology Clinic. Pt was accompanied by no one.   History Summary [name] is a 44 y.o. female with history of HTN not on meds and current smoker was admitted on 10/06/14 for left-sided hemiparesis. MRI showed right thalamus and right corona radiata 2 separate infarcts. CTA head and neck showed left cavernous ICA high grade stenosis. Embolic work up including LE venous doppler and TTE and TEE showed no DVT, EF 40-45% likely due to uncontrolled BP, but AR has a tiny irregularity and can not r/o endocarditis. Cardiology consulted and Dr. Wynonia Lawman did not think that was concerning for endocarditis with no clinical symptoms and negative blood cultures, but he did mention the AR needs to be close monitored. Her BP still high during admission, and renal A ultrasound ruled out RA stenosis. Malignant HTN serology all negative. She was discharged with dural antiplatelet.   Interval History During the interval time, the patient has been doing well. Still has mild left hemiparesis, undergoing PT/OT 2 per week and following up with Dr. Read Drivers. Her BP is better controlled and today is 127/63. She is currently on 6 BP meds. She has quit smoking.    REVIEW OF SYSTEMS: Full 14 system review of systems performed and notable only for those listed below and in HPI above, all others are negative:  Constitutional:   Cardiovascular: swelling in legs Ear/Nose/Throat:   Skin:  Eyes:  Blurry vision Respiratory:   Gastroitestinal:   Genitourinary:  Hematology/Lymphatic:   Endocrine: feeling hot Musculoskeletal:   Allergy/Immunology:   Neurological:  Memory loss., dizziness Psychiatric:  Sleep: snoring  The following represents the patient's updated allergies and side effects list: No  Known Allergies  The neurologically relevant items on the patient's problem list were reviewed on today's visit.  Neurologic Examination  A problem focused neurological exam (12 or more points of the single system neurologic examination, vital signs counts as 1 point, cranial nerves count for 8 points) was performed.  Blood pressure 127/63, pulse 98, height 5\' 6"  (1.676 m), weight 182 lb 6.4 oz (82.736 kg), last menstrual period 01/17/2015.  General - Well nourished, well developed, in no apparent distress.  Ophthalmologic - Sharp disc margins OU.  Cardiovascular - Regular rate and rhythm with no murmur.  Mental Status -  Level of arousal and orientation to time, place, and person were intact. Language including expression, naming, repetition, comprehension was assessed and found intact. Fund of Knowledge was assessed and was intact.  Cranial Nerves II - XII - II - Visual field intact OU. III, IV, VI - Extraocular movements intact. V - Facial sensation decreased on the left, 50% to the right. VII - Facial movement intact bilaterally. VIII - Hearing & vestibular intact bilaterally. X - Palate elevates symmetrically. XI - Chin turning & shoulder shrug intact bilaterally. XII - Tongue protrusion intact.  Motor Strength - The patient's strength was 5/5 RUE and RLE, 4/5 LUE and LLE but with significant give away weakness on testing. in all extremities and pronator drift was absent.  Bulk was normal and fasciculations were absent.   Motor Tone - Muscle tone was assessed at the neck and appendages and was normal.  Reflexes - The patient's reflexes were 1+ in all extremities and she had no pathological reflexes.  Sensory -  Light touch, temperature/pinprick were assessed and were decreased on the left, 50% to the right.    Coordination - The patient had normal movements in the hands and feet with no ataxia or dysmetria.  Tremor was absent.  Gait and Station - walk with walker with mild  left hemiparetic gait.  Data reviewed: I personally reviewed the images and agree with the radiology interpretations.  Ct Angio Head and neck W/cm &/or Wo Cm  10/07/2014 IMPRESSION: 1. No acute arterial findings. 2. No evidence of infarct progression since yesterday. No hemorrhagic conversion. 3. Diffuse bilateral petrous and cavernous carotid stenosis, likely atherosclerotic. Narrowing is up to moderate at the left petrous cavernous junction. 4. Mild beading of the bilateral V3 segments, question fibromuscular dysplasia. 5. 2 mm outpouching from the right supraclinoid carotid, aneurysm or infundibulum with parent vessel below the resolution of CTA. 6. No arterial stenosis in the neck.   Ct Head Wo Contrast  10/06/2014 IMPRESSION: No acute intracranial abnormality. No definite acute cortical infarction.   Mr Brain Wo Contrast  10/06/2014 IMPRESSION: 1. Small acute lacunar infarcts in the lateral right thalamus/posterior limb right external capsule (right PCA territory) and also the right corona radiata (right MCA territory). No mass effect or hemorrhage. 2. Superimposed chronic lacunar infarcts in the left corona radiata and left basal ganglia (left MCA territory). 3. In light of the above in the patient's age, consider a source of cardiac or paradoxical emboli, such as PFO. And otherwise consider risk factors for accelerated small vessel disease.   2D Echocardiogram - Mild LV chamber dilatation with severe LVH, LVEF 40-45%. Grade 1diastolic dysfunction with increased filling pressures. Considerhypertrophic cardiomyopathy or severe hypertensive heart disease. Mild to moderate left atrial enlargement. Moderate aortic regurgitation. Trivial tricuspid regurgitation, unable to assessPASP. Cannot exclude PFO - possible small left to right atrialshunt noted on some views. Trivial pericardial effusion.  LE venous doppler There is no DVT or SVT noted in the bilateral lower extremities.    Renal artery [place] - No evidence of renal artery stenosis noted bilaterally. - Mildly increased intrarenal resistive indices bilaterally. Etiology unknown.  TEE Hypertrophic left ventricle with mildly decreased LVEF 40-45% due to global hypokinesis. Trileaflet aortic valve with a tiny irregularity, cannot exclude a small vegetation. Moderate-to-severe aortic insufficiency with a commisural jet that has a highly eccentric direction towards the mitral valve.  Component     Latest Ref Rng 10/07/2014  Cholesterol     0 - 200 mg/dL 179  Triglycerides     <150 mg/dL 105  HDL Cholesterol     >40 mg/dL 45  Total CHOL/HDL Ratio      4.0  VLDL     0 - 40 mg/dL 21  LDL (calc)     0 - 99 mg/dL 113 (H)  Norepinephrine      100  Epinephrine      25  Dopamine      REPORT  Total Catecholamines (Nor+Epi)      125  PRA LC/MS/MS      0.20  ALDO / PRA Ratio     0.0 - 30.0 30.0  ALDOSTERONE     0.0 - 30.0 ng/dL 6.0  Hemoglobin A1C     4.8 - 5.6 % 5.8 (H)  Mean Plasma Glucose      120    Assessment: As you may recall, she is a 44 y.o. African American female with PMH of uncontrolled HTN and smoker was admitted on 10/06/14 for right thalamus and right  corona radiata 2 separate infarcts, concerning for embolic source vs. Synchronized small vessel events. CTA head and neck showed left cavernous ICA high grade stenosis. Embolic work up including LE venous doppler and TTE and TEE showed no DVT, EF 40-45% likely due to uncontrolled BP, and AV regurg needs to be close monitored. Renal A ultrasound ruled out RA stenosis and malignant HTN serology all negative. She was discharged with dural antiplatelet for 3 months. During the interval time, BP controlled well on 6 BP meds and left sided weakness improved. She has quit smoking. Will repeat 2D echo as per cardiology recommendations.   Plan:  - continue plavix and lipitor for stroke prevention - d/c ASA 81mg  - continue to abstain from smoking - check  BP at home and record - Follow up with your primary care physician for stroke risk factor modification. Recommend maintain blood pressure goal <130/80, diabetes with hemoglobin A1c goal below 6.5% and lipids with LDL cholesterol goal below 70 mg/dL.  - will repeat 2D echo to evaluate heart function - TCD emboli detection and 30 day cardiac event monitoring to finish off embolic work up - continue PT/OT and follow up with Dr. Read Drivers - may consider sleep study next visit as she reports snoring at night. - RTC in 3 months.   Orders Placed This Encounter  Procedures  . [place] TCD WITHMONITORING    Standing Status: Future     Number of Occurrences:      Standing Expiration Date: 07/27/2015    Order Specific Question:  Reason for Exam (SYMPTOM  OR DIAGNOSIS REQUIRED)    Answer:  stroke    Order Specific Question:  Preferred imaging location?    Answer:  Internal  . Cardiac event monitor    Standing Status: Future     Number of Occurrences:      Standing Expiration Date: 01/25/2016    Scheduling Instructions:     Request cardionet setup. Thank you.    Order Specific Question:  Where should this test be performed?    Answer:  [facility]  . ECHOCARDIOGRAM COMPLETE    Standing Status: Future     Number of Occurrences:      Standing Expiration Date: 04/24/2016    Order Specific Question:  Where should this test be performed    Answer:  [facility])    Order Specific Question:  Complete or Limited study?    Answer:  Complete    Order Specific Question:  With Image Enhancing Agent or without Image Enhancing Agent?    Answer:  With Image Enhancing Agent    Order Specific Question:  Reason for exam-Echo    Answer:  Stroke  434.91 / I163.9    No orders of the defined types were placed in this encounter.    Patient Instructions  - continue ASA and lipitor for stroke prevention - continue to abstain from smoking - check BP at home and record - Follow up with your  primary care physician for stroke risk factor modification. Recommend maintain blood pressure goal <130/80, diabetes with hemoglobin A1c goal below 6.5% and lipids with LDL cholesterol goal below 70 mg/dL.  - will repeat 2D echo to evaluate heart function - TCD emboli detection and 30 day cardiac event monitoring - continue PT/OT and follow up with Dr. Read Drivers - follow up in 3 months.     Rosalin Hawking, MD PhD [facility] [address], [place] [employer], [place] [address] [phone]

## 2015-01-24 NOTE — Therapy (Signed)
[facility] [address] [place], [place], [address] Phone: [phone]   Fax:  [phone]  Occupational Therapy Treatment  Patient Details  Name: Brandy Roberts MRN: [id number] Date of Birth: [birth year] Referring Provider:  Lance Bosch, NP  Encounter Date: 01/24/2015      OT End of Session - 01/24/15 1309    Visit Number 7   Number of Visits 17   Date for OT Re-Evaluation 02/25/15   Authorization Type [employer]   Authorization Time Period week 4/8   OT Start Time 1235   OT Stop Time 1315   OT Time Calculation (min) 40 min   Equipment Utilized During Treatment UBE   Activity Tolerance Patient tolerated treatment well   Behavior During Therapy [facility] for tasks assessed/performed      Past Medical History  Diagnosis Date  . Hypertension   . Stroke   . Hyperlipidemia     Past Surgical History  Procedure Laterality Date  . Cesarean section    . Tonsillectomy    . Ganglion cyst excision    . Tee without cardioversion N/A 10/09/2014    Procedure: TRANSESOPHAGEAL ECHOCARDIOGRAM (TEE);  Surgeon: Sanda Klein, MD;  Location: [facility] ENDOSCOPY;  Service: Cardiovascular;  Laterality: N/A;    There were no vitals filed for this visit.  Visit Diagnosis:  Lack of coordination due to stroke  Left arm weakness  Left hand weakness  Hemiparesis affecting nondominant side as late effect of cerebrovascular accident      Subjective Assessment - 01/24/15 1242    Subjective  I saw the eye doctor yesterday.  This stroke done messed me up bad.   Pertinent History See EPIC snapshot   Patient Stated Goals walking better, get mobility back in my arm and leg   Currently in Pain? No/denies   Pain Score 0-No pain                      OT Treatments/Exercises (OP) - 01/24/15 0001    ADLs   UB Dressing Addressing short term goals today, patient opting to wear sports bra often as she is unable yet to  independently hook bra.  She is dependent on her son to help with this task.     LB Dressing Goal review, patient able to don / doff socks and shoes now independently, including tying shoes.     Shoulder Exercises: Seated   Other Seated Exercises UBE x 8 min level 1.5   Hand Exercises   Other Hand Exercises Addressed in hand manipulation with buttons.  Patient initially dropping buttons from left hand, but with cueing to improve attention to task specifics - improved coordination.  Also worked on in Ecologist with steel balls to address coordination within palm of hand and between digits.                  OT Education - 01/24/15 1309    Education provided Yes   Education Details coordination in left hand - in hand manipulation   Person(s) Educated Patient   Methods Explanation;Demonstration   Comprehension Verbalized understanding;Returned demonstration          OT Short Term Goals - 01/24/15 1313    OT SHORT TERM GOAL #1   Title Independent w/ initial HEP for coordination and grip strength (due 01/25/15)   Status Achieved   OT SHORT TERM GOAL #2   Title Improve LUE function as demo by performing 38 blocks or >  on Box & Blocks test   Status On-going  01/24/16 - 35 BLOCKS   OT SHORT TERM GOAL #3   Title Mod I level with hooking bra, donning socks, and tying shoes with A/E and/or compensatory strategies prn   Status Partially Met  Shoes and socks achieved, bra on going   OT SHORT TERM GOAL #4   Title Improve grip strength Lt hand to 35 lbs to assist in opening containers   Status Achieved  42, 35,33 lbs   OT SHORT TERM GOAL #5   Title Pt independent with HEP for vision (tracking and convergence)   Status Achieved           OT Long Term Goals - 01/01/15 1349    OT LONG TERM GOAL #1   Title Independent w/ updated HEP for LUE strengthening (due 02/25/15)   Time 8   Period Weeks   Status New   OT LONG TERM GOAL #2   Title Place 3 # object on high shelf LUE  5/5 trials w/o rest   Baseline eval = unable, 3/5 on MMT (NO resistance tolerated)   Time 8   Period Weeks   Status New   OT LONG TERM GOAL #3   Title Improve coordination as evidenced by performing 9 hole peg test in 28 sec. or less   Baseline eval = 36.75 sec. (Rt = 23.35 sec)   Time 8   Period Weeks   Status New   OT LONG TERM GOAL #4   Title Improve grip strength Lt hand to 45 lbs or greater to assist w/ opening containers/jars   Baseline eval = 20 (Rt = 75 lbs)   Time 8   Period Weeks   Status New   OT LONG TERM GOAL #5   Title Pt to return to IADL tasks safely Mod I level (cooking/cleaning)   Time 8   Period Weeks   Status New   OT LONG TERM GOAL #6   Title Updated cognition goal TBD   Time 8   Period Weeks   Status Deferred  speech therapy referral made to address               Plan - 01/24/15 1310    Clinical Impression Statement Patient seen by Neuro-opthamalogy and physiatry yesterday.   Patient now will start neurontin to address left leg pain at night.  Short term goals checked today, and patient progressing nicely.     Pt will benefit from skilled therapeutic intervention in order to improve on the following deficits (Retired) Decreased cognition;Decreased knowledge of use of DME;Impaired vision/preception;Decreased coordination;Decreased mobility;Impaired sensation;Decreased activity tolerance;Decreased endurance;Decreased strength;Decreased balance;Decreased safety awareness;Impaired perceived functional ability;Impaired UE functional use   Rehab Potential Good   OT Frequency 2x / week   OT Duration 8 weeks   OT Treatment/Interventions Self-care/ADL training;Therapeutic exercise;Cognitive remediation/compensation;Visual/perceptual remediation/compensation;Neuromuscular education;Parrafin;Moist Heat;Functional Mobility Training;Therapeutic exercises;Patient/family education;DME and/or AE instruction;Manual Therapy;Passive range of motion;Therapeutic  activities;Balance training   Plan strength and cloordination left UE    OT Home Exercise Plan 01/15/15: Coordination and putty HEP. 01/18/15: Visual HEP. 01/22/15: LUE strengthening HEP and added convergence/divergence to vision HEP   Consulted and Agree with Plan of Care Patient        Problem List Patient Active Problem List   Diagnosis Date Noted  . Palpitations 01/24/2015  . Essential hypertension 01/24/2015  . Former smoker 01/24/2015  . Cardiomyopathy 01/24/2015  . Alteration of sensations, post-stroke 12/22/2014  . Cognitive deficit, post-stroke 11/23/2014  .  Hypertension 10/31/2014  . Left hemiparesis 10/23/2014  . Cardiomyopathy due to hypertension 10/23/2014  . Chronic ischemic vertebrobasilar artery thalamic stroke   . Aortic valve vegetation   . Hypertensive heart disease   . Overweight (BMI 25.0-29.9)   . H/O noncompliance with medical treatment, presenting hazards to health   . Aortic regurgitation   . Tobacco use disorder 10/07/2014  . Polysubstance abuse 10/07/2014  . Cerebral infarction due to thrombosis of right middle cerebral artery   . Hyperlipidemia   . Embolic stroke involving right middle cerebral artery 10/06/2014    Mariah Milling, OTR/L 01/24/2015, 1:20 PM  [facility] [address] [place], [place], [address] Phone: [phone]   Fax:  [phone]

## 2015-01-25 NOTE — Telephone Encounter (Signed)
Attempted to call patient to give her suggested advice from Dr. Erlinda Hong concerning ASA.  I was also unable to LVM

## 2015-01-29 NOTE — Telephone Encounter (Signed)
Pt called about changing her medication regimen. Please call and advise [phone]

## 2015-01-29 NOTE — Telephone Encounter (Signed)
LF voice message for patients sister Levada Dy to have patient call us back concerning her aspirin regimen.

## 2015-01-29 NOTE — Telephone Encounter (Signed)
Call patient about medication regimen. Nurse unable to leave message.

## 2015-01-31 ENCOUNTER — Ambulatory Visit: Payer: No Typology Code available for payment source | Admitting: Physical Therapy

## 2015-01-31 DIAGNOSIS — IMO0002 Reserved for concepts with insufficient information to code with codable children: Secondary | ICD-10-CM

## 2015-01-31 DIAGNOSIS — I69359 Hemiplegia and hemiparesis following cerebral infarction affecting unspecified side: Secondary | ICD-10-CM

## 2015-01-31 DIAGNOSIS — R269 Unspecified abnormalities of gait and mobility: Secondary | ICD-10-CM

## 2015-01-31 DIAGNOSIS — I69859 Hemiplegia and hemiparesis following other cerebrovascular disease affecting unspecified side: Secondary | ICD-10-CM | POA: Diagnosis not present

## 2015-01-31 NOTE — Therapy (Signed)
[facility] [address] [place], [place], [address] Phone: [phone]   Fax:  [phone]  Physical Therapy Treatment  Patient Details  Name: Brandy Roberts MRN: [id number] Date of Birth: 08-Nov-1970 Referring Provider:  Lance Bosch, NP  Encounter Date: 01/31/2015      PT End of Session - 01/31/15 1817    Visit Number 7   Number of Visits 17   Date for PT Re-Evaluation 02/24/15   Authorization Type [employer]   PT Start Time 1107   PT Stop Time 1145   PT Time Calculation (min) 38 min   Activity Tolerance Patient tolerated treatment well   Behavior During Therapy [facility] for tasks assessed/performed      Past Medical History  Diagnosis Date  . Hypertension   . Stroke   . Hyperlipidemia     Past Surgical History  Procedure Laterality Date  . Cesarean section    . Tonsillectomy    . Ganglion cyst excision    . Tee without cardioversion N/A 10/09/2014    Procedure: TRANSESOPHAGEAL ECHOCARDIOGRAM (TEE);  Surgeon: Sanda Klein, MD;  Location: [facility] ENDOSCOPY;  Service: Cardiovascular;  Laterality: N/A;    There were no vitals filed for this visit.  Visit Diagnosis:  Abnormality of gait  Hemiparesis affecting nondominant side as late effect of cerebrovascular accident  Lack of coordination due to stroke      Subjective Assessment - 01/31/15 1119    Subjective "I ran out of one of my medicines. Can you help me?" Pt unable to remember the name of medication, what the medication is used to treat, or the doctor who prescribed it. Pt with multiple questions about cause of stroke and how to prevent future CVA. Pt has seen the neuro-opthamologist but has not picked up glasses due to inability to afford high cost at this time.    Pertinent History CVA 10/06/14   Patient Stated Goals Pt's goal for therapy is to get mobility back for better walking.   Currently in Pain? Yes   Pain Score 5    Pain Location Leg   Pain  Orientation Left   Pain Descriptors / Indicators Aching   Pain Type Chronic pain   Pain Onset More than a month ago   Pain Frequency Intermittent   Aggravating Factors  "hurts the most in the evenings"   Pain Relieving Factors "take that medicine the doctor gave me (gabapentin) and when I wake up the next morning, it feels better."                         [facility] Adult PT Treatment/Exercise - 01/31/15 0001    Ambulation/Gait   Ambulation/Gait Yes   Ambulation/Gait Assistance 6: Modified independent (Device/Increase time);4: Min guard;4: Min assist   Ambulation Distance (Feet) 320 Feet   Assistive device Rolling walker;None   Gait Pattern Step-through pattern;Decreased arm swing - right;Decreased arm swing - left;Decreased stance time - left;Decreased hip/knee flexion - left;Decreased weight shift to left;Trunk rotated posteriorly on left;Decreased trunk rotation;Poor foot clearance - left;Decreased dorsiflexion - left  trunk extension to initiate LLE advancement   Ambulation Surface Level;Indoor   Gait Comments Gait training [id number]' without AD with multimodal cueing focused on increasing L hip/knee flexion (as opposed to trunk extension); anterior weight shift. No episodes of L genu recurvatum noted during this session.   Standardized Balance Assessment   Standardized Balance Assessment Berg Balance Test;Timed Up and Go Test  Berg Balance Test   Sit to Stand Able to stand without using hands and stabilize independently   Standing Unsupported Able to stand safely 2 minutes   Sitting with Back Unsupported but Feet Supported on Floor or Stool Able to sit safely and securely 2 minutes   Stand to Sit Sits safely with minimal use of hands   Transfers Able to transfer safely, definite need of hands   Standing Unsupported with Eyes Closed Able to stand 10 seconds with supervision   Standing Ubsupported with Feet Together Able to place feet together independently and stand for 1  minute with supervision   From Standing, Reach Forward with Outstretched Arm Can reach confidently >25 cm (10")   From Standing Position, Pick up Object from [place] to pick up shoe safely and easily   From Standing Position, Turn to Look Behind Over each Shoulder Looks behind one side only/other side shows less weight shift  weight shift on R > L   Turn 360 Degrees Needs close supervision or verbal cueing  10 sec to R; 12.5 sec to L   Standing Unsupported, Alternately Place Feet on Step/Stool Able to complete >2 steps/needs minimal assist  LLE abduction   Standing Unsupported, One Foot in Front Able to plae foot ahead of the other independently and hold 30 seconds   Standing on One Leg Tries to lift leg/unable to hold 3 seconds but remains standing independently   Total Score 42   Timed Up and Go Test   TUG --   Normal TUG (seconds) --   Self-Care   Self-Care Other Self-Care Comments   Other Self-Care Comments  Per pt questions, provided extensive education on CVA warning signs, risk factors. Education provided in quiet room to decrease distractions. Provided paper handout to promote carryover of education.   Neuro Re-ed    Neuro Re-ed Details  --                PT Education - 01/31/15 1807    Education provided Yes   Education Details Recommended that pt bring bottle of empty medication to primary pharmacy and ask pharmacist to ask for MD approval to refill medication. CVA warning signs, risk factors. Berg findings, functional implications, fall risk.   Person(s) Educated Patient   Methods Explanation;Handout   Comprehension Verbalized understanding;Need further instruction          PT Short Term Goals - 01/31/15 1129    PT SHORT TERM GOAL #1   Title Pt will perform HEP with family supervision for improved strength, balance, gait.  Target 01/24/15   Baseline Cueing from PT required, as no family present.   Time 4   Period Weeks   Status Partially Met   PT SHORT  TERM GOAL #2   Title Pt will perform at least 8 of 10 reps of sit<>stand transfers with minimal to no UE support for improved transfer efficiency and safety.   Baseline 8/24: required (S)/cueing for initial 2 sit>stands; mod I for subsequent 8 reps   Time 4   Period Weeks   Status Achieved   PT SHORT TERM GOAL #3   Title Pt will improve Berg Balance score to at least 36/56 for decreased fall risk.   Baseline 8/31: Berg score = 42/56   Time 4   Period Weeks   Status Achieved   PT SHORT TERM GOAL #4   Title Pt will improve TUG score to less than or equal to 30 seconds for decreased  fall risk.   Baseline Achieved 8/24. TUG = 27.38 (avg of 3 trials) using RW.   Time 4   Period Weeks   Status Achieved   PT SHORT TERM GOAL #5   Title Pt will verbalize understanding of CVA education   Baseline --   Time 4   Period Weeks   Status Not Met           PT Long Term Goals - 01/01/15 1751    PT LONG TERM GOAL #1   Title Pt will verbalize understanding of fall prevention within the home environment.  Target date 02/24/15   Baseline Pt at fall risk per Berg, TUG, gait velocity scores   Time 8   Period Weeks   Status On-going   PT LONG TERM GOAL #2   Title Pt will improve gait velocity to at least 1.8 ft/sec for decreased risk of falls/improved gait efficiency.   Baseline gait velocity 1.07 ft/sec   Time 8   Period Weeks   Status On-going   PT LONG TERM GOAL #3   Title Pt will improve Berg Balance score to at least 41/56 for decreased fall risk.   Baseline Berg score 31/56 at eval   Time 8   Period Weeks   Status On-going   PT LONG TERM GOAL #4   Title Pt will improve TUG score to less than or equal to 20 seconds for decreased fall risk.   Baseline TUG score 36.24 sec at eval   Time 8   Period Weeks   Status On-going   PT LONG TERM GOAL #5   Title Pt will ambulate household distances, at least 50-100 ft using cane, modified independently, for improved household gait.   Baseline  Gait 50 ft with RW at eval   Time 8   Period Weeks   Status On-going               Plan - 01/31/15 1820    Clinical Impression Statement STG for Berg met, as pt improved Berg score from 31 to 42/56, suggesting significant improvement in functional standing balance but ongoing fall risk. Pt with many questions about cause of stroke, how to prevent future CVA; therefore, provided extensive education on CVA warning signs, modifiable risk factors. Continue per POC.   Pt will benefit from skilled therapeutic intervention in order to improve on the following deficits Abnormal gait;Decreased balance;Decreased endurance;Decreased safety awareness;Decreased mobility;Decreased strength;Difficulty walking   Rehab Potential Good   PT Frequency 2x / week   PT Duration 8 weeks   PT Treatment/Interventions ADLs/Self Care Home Management;Gait training;Neuromuscular re-education;Balance training;Therapeutic exercise;Therapeutic activities;Functional mobility training;Patient/family education;Orthotic Fit/Training   PT Next Visit Plan Check for pt safety/performance with HEP updated on 8/18. Gait training: address trunk extension to compensate for decreased L hip/knee flexion during LLE advancement. NMR: closed chain activities for increased LLE proprioceptive input, proximal stability/control   Recommended Other Services 8/31: Pt currently using [place]; so no need to contatct MD about smoking cessation resources at this time.   Consulted and Agree with Plan of Care Patient        Problem List Patient Active Problem List   Diagnosis Date Noted  . Palpitations 01/24/2015  . Essential hypertension 01/24/2015  . Former smoker 01/24/2015  . Cardiomyopathy 01/24/2015  . Alteration of sensations, post-stroke 12/22/2014  . Cognitive deficit, post-stroke 11/23/2014  . Hypertension 10/31/2014  . Left hemiparesis 10/23/2014  . Cardiomyopathy due to hypertension 10/23/2014  . Chronic ischemic  vertebrobasilar artery  thalamic stroke   . Aortic valve vegetation   . Hypertensive heart disease   . Overweight (BMI 25.0-29.9)   . H/O noncompliance with medical treatment, presenting hazards to health   . Aortic regurgitation   . Tobacco use disorder 10/07/2014  . Polysubstance abuse 10/07/2014  . Cerebral infarction due to thrombosis of right middle cerebral artery   . Hyperlipidemia   . Embolic stroke involving right middle cerebral artery 10/06/2014   Billie Ruddy, PT, DPT [facility] [address] [place], [place], [address] Phone: [phone]   Fax:  [phone] 01/31/2015, 6:28 PM

## 2015-02-06 ENCOUNTER — Ambulatory Visit: Payer: Medicaid Other | Attending: Physical Medicine & Rehabilitation | Admitting: Occupational Therapy

## 2015-02-06 DIAGNOSIS — I69859 Hemiplegia and hemiparesis following other cerebrovascular disease affecting unspecified side: Secondary | ICD-10-CM | POA: Insufficient documentation

## 2015-02-06 DIAGNOSIS — I6980 Unspecified sequelae of other cerebrovascular disease: Secondary | ICD-10-CM | POA: Insufficient documentation

## 2015-02-06 DIAGNOSIS — R269 Unspecified abnormalities of gait and mobility: Secondary | ICD-10-CM | POA: Insufficient documentation

## 2015-02-06 DIAGNOSIS — R29898 Other symptoms and signs involving the musculoskeletal system: Secondary | ICD-10-CM | POA: Insufficient documentation

## 2015-02-06 DIAGNOSIS — R1310 Dysphagia, unspecified: Secondary | ICD-10-CM | POA: Insufficient documentation

## 2015-02-06 DIAGNOSIS — R482 Apraxia: Secondary | ICD-10-CM | POA: Insufficient documentation

## 2015-02-06 DIAGNOSIS — M25512 Pain in left shoulder: Secondary | ICD-10-CM | POA: Insufficient documentation

## 2015-02-06 DIAGNOSIS — R1314 Dysphagia, pharyngoesophageal phase: Secondary | ICD-10-CM | POA: Insufficient documentation

## 2015-02-06 DIAGNOSIS — R279 Unspecified lack of coordination: Secondary | ICD-10-CM | POA: Insufficient documentation

## 2015-02-06 DIAGNOSIS — M6289 Other specified disorders of muscle: Secondary | ICD-10-CM | POA: Insufficient documentation

## 2015-02-06 DIAGNOSIS — R4189 Other symptoms and signs involving cognitive functions and awareness: Secondary | ICD-10-CM | POA: Insufficient documentation

## 2015-02-07 ENCOUNTER — Ambulatory Visit: Payer: Medicaid Other | Admitting: Rehabilitation

## 2015-02-07 ENCOUNTER — Ambulatory Visit: Payer: Medicaid Other | Admitting: Occupational Therapy

## 2015-02-07 ENCOUNTER — Ambulatory Visit: Payer: Medicaid Other

## 2015-02-07 DIAGNOSIS — R269 Unspecified abnormalities of gait and mobility: Secondary | ICD-10-CM | POA: Diagnosis present

## 2015-02-07 DIAGNOSIS — M6289 Other specified disorders of muscle: Secondary | ICD-10-CM | POA: Diagnosis present

## 2015-02-07 DIAGNOSIS — R1310 Dysphagia, unspecified: Secondary | ICD-10-CM

## 2015-02-07 DIAGNOSIS — R482 Apraxia: Secondary | ICD-10-CM | POA: Diagnosis present

## 2015-02-07 DIAGNOSIS — M25512 Pain in left shoulder: Secondary | ICD-10-CM | POA: Diagnosis present

## 2015-02-07 DIAGNOSIS — I6980 Unspecified sequelae of other cerebrovascular disease: Secondary | ICD-10-CM | POA: Diagnosis present

## 2015-02-07 DIAGNOSIS — R29898 Other symptoms and signs involving the musculoskeletal system: Secondary | ICD-10-CM | POA: Diagnosis present

## 2015-02-07 DIAGNOSIS — R4189 Other symptoms and signs involving cognitive functions and awareness: Secondary | ICD-10-CM | POA: Diagnosis present

## 2015-02-07 DIAGNOSIS — R279 Unspecified lack of coordination: Secondary | ICD-10-CM | POA: Diagnosis present

## 2015-02-07 DIAGNOSIS — R1314 Dysphagia, pharyngoesophageal phase: Secondary | ICD-10-CM | POA: Diagnosis present

## 2015-02-07 DIAGNOSIS — I69859 Hemiplegia and hemiparesis following other cerebrovascular disease affecting unspecified side: Secondary | ICD-10-CM | POA: Diagnosis present

## 2015-02-07 NOTE — Patient Instructions (Signed)
Think about slowing down your talking - this will help you to talk easier.

## 2015-02-07 NOTE — Therapy (Signed)
[facility] [address] [place], [place], [address] Phone: [phone]   Fax:  [phone]  Speech Language Pathology Evaluation  Patient Details  Name: Brandy Roberts MRN: [id number] Date of Birth: 1970/11/29 Referring Provider:  Lance Bosch, NP  Encounter Date: 02/07/2015      End of Session - 02/07/15 1731    Visit Number 1   Number of Visits 16   Date for SLP Re-Evaluation 04/09/15   SLP Start Time 1151   SLP Stop Time  1232   SLP Time Calculation (min) 41 min   Activity Tolerance Patient tolerated treatment well      Past Medical History  Diagnosis Date  . Hypertension   . Stroke   . Hyperlipidemia     Past Surgical History  Procedure Laterality Date  . Cesarean section    . Tonsillectomy    . Ganglion cyst excision    . Tee without cardioversion N/A 10/09/2014    Procedure: TRANSESOPHAGEAL ECHOCARDIOGRAM (TEE);  Surgeon: Sanda Klein, MD;  Location: [facility] ENDOSCOPY;  Service: Cardiovascular;  Laterality: N/A;    There were no vitals filed for this visit.  Visit Diagnosis: Verbal apraxia      Subjective Assessment - 02/07/15 1215    Currently in Pain? Yes   Pain Score 5    Pain Location Leg   Pain Orientation Left   Pain Descriptors / Indicators Aching   Pain Type Chronic pain   Pain Onset More than a month ago   Pain Frequency Intermittent   Pain Relieving Factors medicine            SLP Evaluation [facility] - 02/07/15 1215    SLP Visit Information   SLP Received On 02/07/15   Onset Date 10-06-14   Medical Diagnosis CVA   General Information   Other Pertinent Information low literacy level   Prior Functional Status   Cognitive/Linguistic Baseline Within functional limits   Cognition   Overall Cognitive Status Impaired/Different from baseline   Memory Impaired  reported   Memory Impairment Decreased short term memory  pt missed appointment yesterday   Auditory Comprehension    Conversation Complex   Other Conversation Comments pt appears to hesitate comprehension with more complex conversation/auditory stimuli   Reading Comprehension   Reading Status Within funtional limits   Expression   Primary Mode of Expression Verbal   Verbal Expression   Overall Verbal Expression Impaired   Naming Impairment   Responsive --  100%   Confrontation 75-100% accurate   Convergent --  77% (occupations) - pt giving device used, not the job    Oral Motor/Sensory Function   Labial Coordination Reduced   Lingual Sensation Reduced  intermittently   Lingual Coordination Reduced   Facial Strength Reduced   Overall Oral Motor/Sensory Function sluggish movement    Motor Speech   Overall Motor Speech Impaired   Articulation Impaired   Level of Impairment Phrase   Intelligibility Intelligible   Motor Planning Impaired  pt did much better with reading than spontaneous speech   Level of Impairment Phrase   Motor Speech Errors Groping for words;Consistent;Aware   Effective Techniques Slow rate                         SLP Education - 02/07/15 1730    Education provided Yes   Education Details verbal apraxia, compensation of slow reate   Person(s) Educated Patient   Methods Explanation;Demonstration  Comprehension Verbalized understanding;Need further instruction          SLP Short Term Goals - 02/07/15 1737    SLP SHORT TERM GOAL #1   Title pt will demo slowed rate in 15/20 sentences with rare min A   Time 4   Period Weeks   Status New   SLP SHORT TERM GOAL #2   Title pt will demo HEP for apraxia/motor planning with occasinal min A   Time 4   Period Weeks   Status New   SLP SHORT TERM GOAL #3   Title pt will demo compensations adequate for 90% WNL speech in 3 mintues simple conversation with nonverbal cues   Time 4   Period Weeks   Status New          SLP Long Term Goals - 02/07/15 1739    SLP LONG TERM GOAL #1   Title pt will demo  [facility] speech in 10 minutes mod complex conversation using modified independence (compensations)   Time 8   Period Weeks  or 16 visits   Status New   SLP LONG TERM GOAL #2   Title pt will demo HEP for motor planning/apraxia with rare min A   Time 8   Period Weeks  or 16 visits   Status New   SLP LONG TERM GOAL #3   Title pt will demo simple WFL conversation for 7 minutes with mod ified independence (compensations)   Time 8   Period Weeks  or 16 visits   Status New          Plan - 02/07/15 1732    Clinical Impression Statement 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 Pt was counseled today in order to compensate she will have to reduce her speech rate. Skilled ST is needed to maximize pt verbal expression skills in conversation and to teach pt compensatory measures to use while her brain heals for more WNL speech production.   Speech Therapy Frequency 2x / week   Duration --  8 weeks, or 16 visits   Treatment/Interventions Internal/external aids;Compensatory strategies;SLP instruction and feedback;Patient/family education;Functional tasks;Cueing hierarchy   Potential to Achieve Goals Good   Potential Considerations Co-morbidities  pt with decr'd memory/organization hindering attendance, per pt   Consulted and Agree with Plan of Care Patient        Problem List Patient Active Problem List   Diagnosis Date Noted  . Palpitations 01/24/2015  . Essential hypertension 01/24/2015  . Former smoker 01/24/2015  . Cardiomyopathy 01/24/2015  . Alteration of sensations, post-stroke 12/22/2014  . Cognitive deficit, post-stroke 11/23/2014  . Hypertension 10/31/2014  . Left hemiparesis 10/23/2014  . Cardiomyopathy due to hypertension 10/23/2014  . Chronic ischemic vertebrobasilar artery thalamic stroke   . Aortic valve vegetation   . Hypertensive heart disease   . Overweight (BMI 25.0-29.9)   . H/O noncompliance with medical treatment, presenting hazards to health    . Aortic regurgitation   . Tobacco use disorder 10/07/2014  . Polysubstance abuse 10/07/2014  . Cerebral infarction due to thrombosis of right middle cerebral artery   . Hyperlipidemia   . Embolic stroke involving right middle cerebral artery 10/06/2014    [facility] , MS, CCC-SLP  02/07/2015, 5:42 PM  [place] [address] [place], [place], [address] Phone: [phone]   Fax:  [phone]

## 2015-02-08 ENCOUNTER — Encounter: Payer: No Typology Code available for payment source | Admitting: Occupational Therapy

## 2015-02-08 ENCOUNTER — Other Ambulatory Visit: Payer: Self-pay | Admitting: Family Medicine

## 2015-02-08 ENCOUNTER — Ambulatory Visit: Payer: No Typology Code available for payment source | Admitting: Physical Therapy

## 2015-02-09 ENCOUNTER — Ambulatory Visit: Payer: Medicaid Other | Admitting: Occupational Therapy

## 2015-02-09 ENCOUNTER — Encounter: Payer: Self-pay | Admitting: Occupational Therapy

## 2015-02-09 ENCOUNTER — Encounter: Payer: Self-pay | Admitting: Rehabilitation

## 2015-02-09 ENCOUNTER — Ambulatory Visit: Payer: Medicaid Other | Admitting: Rehabilitation

## 2015-02-09 DIAGNOSIS — R269 Unspecified abnormalities of gait and mobility: Secondary | ICD-10-CM

## 2015-02-09 DIAGNOSIS — R29898 Other symptoms and signs involving the musculoskeletal system: Secondary | ICD-10-CM

## 2015-02-09 DIAGNOSIS — R4189 Other symptoms and signs involving cognitive functions and awareness: Secondary | ICD-10-CM

## 2015-02-09 DIAGNOSIS — IMO0002 Reserved for concepts with insufficient information to code with codable children: Secondary | ICD-10-CM

## 2015-02-09 DIAGNOSIS — I69359 Hemiplegia and hemiparesis following cerebral infarction affecting unspecified side: Secondary | ICD-10-CM

## 2015-02-09 DIAGNOSIS — M25512 Pain in left shoulder: Secondary | ICD-10-CM

## 2015-02-09 NOTE — Patient Instructions (Signed)
Modified Home Program for your left arm:  Do these every day twice a day  Replace #1 and #2 with the two listed below. Keep doing #3 and #4.  1.  Lay on your back.  Hold a 2 POUND CAN in both hands, with palms facing each other.  Start with can on your chest. Slowly raise the can toward the ceiling until your elbows are straight then lower back to starting position.  Do 10, rest then do 10 more (you may need to work your way up to that)  2. Lay on your back. Hold a 1 POUND CAN in both hands, with palms facing each other. Start with can on your chest, then raise can toward ceiling until elbows are straight. KEEPING ELBOWS STRAIGHT, raise your arms overhead as far as you can go without pain. Then reach toward legs.  Go slowly and control the can. Do 10, rest then do 10 more.

## 2015-02-09 NOTE — Patient Instructions (Signed)
Functional Quadriceps: Sit to Stand   Sit on edge of chair, feet flat on floor. Make sure your feet are even (left foot not too far forward/backwards). Stand up without using your hands. Repeat __7_ times per set. Do _2-3_ sessions per day.  Abduction: Clam (Eccentric) - Side-Lying   Make sure your left ankle brace is off for this exercise.   Lie on RIGHT side with knees bent. Hold onto the edge of the bed with your top (left) hand to prevent your hips from moving. Lift top (LEFT) knee, keeping feet together. Keep trunk steady. Slowly lower for 2-3 seconds.   Perform 10 repetitions, 3 times per day.   Walking Program  Begin walking in a safe place using your rolling walker and ankle brace. You may choose to walk outside due to the heat/humidity. Try to walk over level ground without obstacles in the way.  Start by walking 4 consecutive minutes, 3 times daily. Add 1 minute per week.   HIP: Flexors - Supine - LEFT   Lie on edge of low bed. Place left leg off the bed and place LEFT foot flat on the ground with a small lift underneath (large book with blue band underneath. eep right knee straight.. Right knee should be straight. Push LEFT foot into lift (book) to slightly lift L hips from bed. Hold for 1-2 seconds. Do this 12 times. 3 sets per day.  Log Roll        

## 2015-02-09 NOTE — Therapy (Signed)
[facility] [address] [place], [place], [address] Phone: [phone]   Fax:  [phone]  Physical Therapy Treatment  Patient Details  Name: Brandy Roberts MRN: [id number] Date of Birth: 1971-05-02 Referring Provider:  Lance Bosch, NP  Encounter Date: 02/09/2015      PT End of Session - 02/09/15 1708    Visit Number 8   Number of Visits 17   Date for PT Re-Evaluation 02/24/15   Authorization Type [employer]   PT Start Time 1537   PT Stop Time 1618   PT Time Calculation (min) 41 min   Activity Tolerance Patient tolerated treatment well   Behavior During Therapy [facility] for tasks assessed/performed      Past Medical History  Diagnosis Date  . Hypertension   . Stroke   . Hyperlipidemia     Past Surgical History  Procedure Laterality Date  . Cesarean section    . Tonsillectomy    . Ganglion cyst excision    . Tee without cardioversion N/A 10/09/2014    Procedure: TRANSESOPHAGEAL ECHOCARDIOGRAM (TEE);  Surgeon: Sanda Klein, MD;  Location: [facility] ENDOSCOPY;  Service: Cardiovascular;  Laterality: N/A;    There were no vitals filed for this visit.  Visit Diagnosis:  Abnormality of gait  Weakness of left lower extremity  Lack of coordination due to stroke      Subjective Assessment - 02/09/15 1541    Subjective Pt reports that she is going to go pick up medication that she ran out of today after session.  Reports feeling "down" since she is unable to return to work yet.    Pertinent History CVA 10/06/14   Limitations Walking;House hold activities   Patient Stated Goals Pt's goal for therapy is to get mobility back for better walking.   Currently in Pain? Yes   Pain Score 5    Pain Location Arm   Pain Orientation Left   Pain Descriptors / Indicators Aching   Pain Type Chronic pain   Pain Onset More than a month ago   Aggravating Factors  hurts in the evenings   Pain Relieving Factors meds                          [facility] Adult PT Treatment/Exercise - 02/09/15 1652    Bed Mobility   Bed Mobility Supine to Sit;Sit to Supine   Supine to Sit 5: Supervision   Sit to Supine 5: Supervision   Sit to Supine - Details (indicate cue type and reason) focus on log rolling technique as in HEP, requires cues for technique.    Transfers   Transfers Sit to Stand;Stand to Sit;Supine to Sit;Sit to Supine   Sit to Stand 5: Supervision   Stand to Sit 5: Supervision   Supine to Sit 5: Supervision   Sit to Supine 5: Supervision   Comments cues for equal WB and increasing weight shift to the L during transition.    Ambulation/Gait   Ambulation/Gait Yes   Ambulation/Gait Assistance 4: Min assist;5: Supervision  S with RW, min A without to provide facilitation   Ambulation/Gait Assistance Details Performed gait with RW x 130' with heel wedge removed due to being very uncomfortable.  Note that pt still has tendency to demo genu recurvatum during stance phase and needs assist to control knee without heel wedge.  Performed another 15' without RW to facilitate increased isolated hip/knee flexion during swing, upright posture and increased L  hip protraction for forward weight shift over LLE during stance.  Provided facilitation for this at L knee and hips.  She did require assist at knee to prevent hyperextension.     Ambulation Distance (Feet) 260 Feet   Assistive device Rolling walker;None   Gait Pattern Step-through pattern;Decreased stance time - left;Decreased step length - right;Decreased dorsiflexion - left;Decreased weight shift to left;Ataxic;Poor foot clearance - left;Left genu recurvatum  w/ AFO   Ambulation Surface Level;Indoor   Gait Comments --   Self-Care   Self-Care Other Self-Care Comments   Other Self-Care Comments  Pt unable to fully recall all s/s and warning signs of CVA, therefore continue to educate on these during session.    Neuro Re-ed    Neuro Re-ed Details   Performed pre-gait task of advancing LLE, moving hip into extension and then retrostepping to address hip protraction, isolated hip/knee flex.  Performed x 15 reps   Exercises   Exercises Other Exercises   Other Exercises  Reviewed and performed all of HEP, see pt instruction for details.                  PT Education - 02/09/15 1707    Education provided Yes   Education Details CVA warning signs   Person(s) Educated Patient   Methods Explanation;Demonstration   Comprehension Verbalized understanding          PT Short Term Goals - 01/31/15 1129    PT SHORT TERM GOAL #1   Title Pt will perform HEP with family supervision for improved strength, balance, gait.  Target 01/24/15   Baseline Cueing from PT required, as no family present.   Time 4   Period Weeks   Status Partially Met   PT SHORT TERM GOAL #2   Title Pt will perform at least 8 of 10 reps of sit<>stand transfers with minimal to no UE support for improved transfer efficiency and safety.   Baseline 8/24: required (S)/cueing for initial 2 sit>stands; mod I for subsequent 8 reps   Time 4   Period Weeks   Status Achieved   PT SHORT TERM GOAL #3   Title Pt will improve Berg Balance score to at least 36/56 for decreased fall risk.   Baseline 8/31: Berg score = 42/56   Time 4   Period Weeks   Status Achieved   PT SHORT TERM GOAL #4   Title Pt will improve TUG score to less than or equal to 30 seconds for decreased fall risk.   Baseline Achieved 8/24. TUG = 27.38 (avg of 3 trials) using RW.   Time 4   Period Weeks   Status Achieved   PT SHORT TERM GOAL #5   Title Pt will verbalize understanding of CVA education   Baseline --   Time 4   Period Weeks   Status Not Met           PT Long Term Goals - 01/01/15 1751    PT LONG TERM GOAL #1   Title Pt will verbalize understanding of fall prevention within the home environment.  Target date 02/24/15   Baseline Pt at fall risk per Berg, TUG, gait velocity scores    Time 8   Period Weeks   Status On-going   PT LONG TERM GOAL #2   Title Pt will improve gait velocity to at least 1.8 ft/sec for decreased risk of falls/improved gait efficiency.   Baseline gait velocity 1.07 ft/sec   Time 8   Period  Weeks   Status On-going   PT LONG TERM GOAL #3   Title Pt will improve Berg Balance score to at least 41/56 for decreased fall risk.   Baseline Berg score 31/56 at eval   Time 8   Period Weeks   Status On-going   PT LONG TERM GOAL #4   Title Pt will improve TUG score to less than or equal to 20 seconds for decreased fall risk.   Baseline TUG score 36.24 sec at eval   Time 8   Period Weeks   Status On-going   PT LONG TERM GOAL #5   Title Pt will ambulate household distances, at least 50-100 ft using cane, modified independently, for improved household gait.   Baseline Gait 50 ft with RW at eval   Time 8   Period Weeks   Status On-going               Plan - 02/09/15 1709    Clinical Impression Statement Reviewed HEP from 01/18/15, see pt instructions for details.  Reviewed s/s and warning signs of CVA.  Remainder of session focused on NMR with gait to increase isolated hip/knee flexion during swing, knee stability in stance, and increased L hip protraction in L stance.  Removed current heel wedge and applied new smaller one for increased comfort.  Assessed gait with new wedge and note small amount of hyperextension, however marked improvement in comfort.     Pt will benefit from skilled therapeutic intervention in order to improve on the following deficits Abnormal gait;Decreased balance;Decreased endurance;Decreased safety awareness;Decreased mobility;Decreased strength;Difficulty walking   Rehab Potential Good   PT Frequency 2x / week   PT Duration 8 weeks   PT Treatment/Interventions ADLs/Self Care Home Management;Gait training;Neuromuscular re-education;Balance training;Therapeutic exercise;Therapeutic activities;Functional mobility  training;Patient/family education;Orthotic Fit/Training   PT Next Visit Plan Gait training: address trunk extension to compensate for decreased L hip/knee flexion during LLE advancement. NMR: closed chain activities for increased LLE proprioceptive input, proximal stability/control   Consulted and Agree with Plan of Care Patient        Problem List Patient Active Problem List   Diagnosis Date Noted  . Palpitations 01/24/2015  . Essential hypertension 01/24/2015  . Former smoker 01/24/2015  . Cardiomyopathy 01/24/2015  . Alteration of sensations, post-stroke 12/22/2014  . Cognitive deficit, post-stroke 11/23/2014  . Hypertension 10/31/2014  . Left hemiparesis 10/23/2014  . Cardiomyopathy due to hypertension 10/23/2014  . Chronic ischemic vertebrobasilar artery thalamic stroke   . Aortic valve vegetation   . Hypertensive heart disease   . Overweight (BMI 25.0-29.9)   . H/O noncompliance with medical treatment, presenting hazards to health   . Aortic regurgitation   . Tobacco use disorder 10/07/2014  . Polysubstance abuse 10/07/2014  . Cerebral infarction due to thrombosis of right middle cerebral artery   . Hyperlipidemia   . Embolic stroke involving right middle cerebral artery 10/06/2014    Cameron Sprang, PT, MPT [facility] [address] [place], [place], [address] Phone: [phone]   Fax:  [phone] 02/09/2015, 5:13 PM
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
## 2015-02-13 ENCOUNTER — Encounter: Payer: Self-pay | Admitting: Rehabilitation

## 2015-02-13 ENCOUNTER — Ambulatory Visit: Payer: Medicaid Other | Admitting: Rehabilitation

## 2015-02-13 ENCOUNTER — Ambulatory Visit: Payer: Medicaid Other | Admitting: Occupational Therapy

## 2015-02-13 ENCOUNTER — Other Ambulatory Visit (HOSPITAL_COMMUNITY): Payer: No Typology Code available for payment source

## 2015-02-13 DIAGNOSIS — R29898 Other symptoms and signs involving the musculoskeletal system: Secondary | ICD-10-CM

## 2015-02-13 DIAGNOSIS — I69359 Hemiplegia and hemiparesis following cerebral infarction affecting unspecified side: Secondary | ICD-10-CM

## 2015-02-13 DIAGNOSIS — R269 Unspecified abnormalities of gait and mobility: Secondary | ICD-10-CM

## 2015-02-13 DIAGNOSIS — M25512 Pain in left shoulder: Secondary | ICD-10-CM

## 2015-02-13 DIAGNOSIS — IMO0002 Reserved for concepts with insufficient information to code with codable children: Secondary | ICD-10-CM
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
## 2015-02-13 NOTE — Therapy (Signed)
[facility] [address] [place], [place], [address] Phone: [phone]   Fax:  [phone]  Physical Therapy Treatment  Patient Details  Name: Brandy Roberts MRN: [id number] Date of Birth: May 21, 1971 Referring Provider:  Lance Bosch, NP  Encounter Date: 02/13/2015      PT End of Session - 02/13/15 1445    Visit Number 9   Number of Visits 17   Date for PT Re-Evaluation 02/24/15   Authorization Type [employer]   PT Start Time 1400   PT Stop Time 1417   PT Time Calculation (min) 17 min   Activity Tolerance Patient tolerated treatment well   Behavior During Therapy [facility] for tasks assessed/performed      Past Medical History  Diagnosis Date  . Hypertension   . Stroke   . Hyperlipidemia     Past Surgical History  Procedure Laterality Date  . Cesarean section    . Tonsillectomy    . Ganglion cyst excision    . Tee without cardioversion N/A 10/09/2014    Procedure: TRANSESOPHAGEAL ECHOCARDIOGRAM (TEE);  Surgeon: Sanda Klein, MD;  Location: [facility] ENDOSCOPY;  Service: Cardiovascular;  Laterality: N/A;    There were no vitals filed for this visit.  Visit Diagnosis:  Abnormality of gait  Weakness of left lower extremity  Hemiparesis affecting nondominant side as late effect of cerebrovascular accident      Subjective Assessment - 02/13/15 1426    Subjective Pt reports that new heel wedge is feeling better in shoe.  Reports needing to leave at 215 today due to ride needing to leave at that time.    Pertinent History CVA 10/06/14   Limitations Walking;House hold activities   Patient Stated Goals Pt's goal for therapy is to get mobility back for better walking.   Currently in Pain? No/denies                         [facility] Adult PT Treatment/Exercise - 02/13/15 1400    Ambulation/Gait   Ambulation/Gait Yes   Ambulation/Gait Assistance 4: Min assist  for facilitation   Ambulation/Gait  Assistance Details Performed gait towards front entrance with AFO and heel wedge to encourage carryover from NMR activity.  Provided facilitation at L pelvis for increased protraction but overall seemed improved.     Ambulation Distance (Feet) 50 Feet   Assistive device Rolling walker   Gait Pattern Step-through pattern;Decreased stance time - left;Decreased step length - right;Decreased dorsiflexion - left;Decreased weight shift to left;Ataxic;Poor foot clearance - left;Left genu recurvatum   Ambulation Surface Level;Indoor   Neuro Re-ed    Neuro Re-ed Details  Performed standing tasks to assist increase stability at L knee as well as to increase forward weight shift during L mid and terminal stance with activities as follows; Standing at high/low mat with BUEs on physioball keeping L knee in bent position while tapping RLE in three directions x 2 sets of 5 reps>tapping RLE to unstable green disc to elevated stool somewhat posterior to midline to open pelvis and increase hip ER on L.  Tolerated well with light tactile cues at L knee for support and increase knee bend.  Progressed to standing at wall with LLE in front of R while rolling L UE on physioball up wall to facilitate forward weight shift over LLE (simulating mid to terminal stance phase).  PT provided light facilitation at L scapular to encourage proper sequence and movement and at hip to increase  extension.  cues to keep L knee somewhat extended as she tended to compensate with forward lunge rather than shift.                  PT Education - 02/13/15 1445    Education provided Yes   Education Details Verbal education to continue to perform HEP, carrying over hip protraction during gait.    Person(s) Educated Patient   Methods Explanation;Demonstration   Comprehension Verbalized understanding;Returned demonstration;Need further instruction          PT Short Term Goals - 01/31/15 1129    PT SHORT TERM GOAL #1   Title Pt will  perform HEP with family supervision for improved strength, balance, gait.  Target 01/24/15   Baseline Cueing from PT required, as no family present.   Time 4   Period Weeks   Status Partially Met   PT SHORT TERM GOAL #2   Title Pt will perform at least 8 of 10 reps of sit<>stand transfers with minimal to no UE support for improved transfer efficiency and safety.   Baseline 8/24: required (S)/cueing for initial 2 sit>stands; mod I for subsequent 8 reps   Time 4   Period Weeks   Status Achieved   PT SHORT TERM GOAL #3   Title Pt will improve Berg Balance score to at least 36/56 for decreased fall risk.   Baseline 8/31: Berg score = 42/56   Time 4   Period Weeks   Status Achieved   PT SHORT TERM GOAL #4   Title Pt will improve TUG score to less than or equal to 30 seconds for decreased fall risk.   Baseline Achieved 8/24. TUG = 27.38 (avg of 3 trials) using RW.   Time 4   Period Weeks   Status Achieved   PT SHORT TERM GOAL #5   Title Pt will verbalize understanding of CVA education   Baseline --   Time 4   Period Weeks   Status Not Met           PT Long Term Goals - 01/01/15 1751    PT LONG TERM GOAL #1   Title Pt will verbalize understanding of fall prevention within the home environment.  Target date 02/24/15   Baseline Pt at fall risk per Berg, TUG, gait velocity scores   Time 8   Period Weeks   Status On-going   PT LONG TERM GOAL #2   Title Pt will improve gait velocity to at least 1.8 ft/sec for decreased risk of falls/improved gait efficiency.   Baseline gait velocity 1.07 ft/sec   Time 8   Period Weeks   Status On-going   PT LONG TERM GOAL #3   Title Pt will improve Berg Balance score to at least 41/56 for decreased fall risk.   Baseline Berg score 31/56 at eval   Time 8   Period Weeks   Status On-going   PT LONG TERM GOAL #4   Title Pt will improve TUG score to less than or equal to 20 seconds for decreased fall risk.   Baseline TUG score 36.24 sec at eval    Time 8   Period Weeks   Status On-going   PT LONG TERM GOAL #5   Title Pt will ambulate household distances, at least 50-100 ft using cane, modified independently, for improved household gait.   Baseline Gait 50 ft with RW at eval   Time 8   Period Weeks   Status On-going  Plan - 02/13/15 1448    Clinical Impression Statement Skilled session focused on NMR for L knee control during stance as well as hip protraction during L stance phase of gait.  Shorter session due to pts ride needing to leave early today.  Pt tolerated well, continue POC.     Pt will benefit from skilled therapeutic intervention in order to improve on the following deficits Abnormal gait;Decreased balance;Decreased endurance;Decreased safety awareness;Decreased mobility;Decreased strength;Difficulty walking   Rehab Potential Good   PT Frequency 2x / week   PT Duration 8 weeks   PT Treatment/Interventions ADLs/Self Care Home Management;Gait training;Neuromuscular re-education;Balance training;Therapeutic exercise;Therapeutic activities;Functional mobility training;Patient/family education;Orthotic Fit/Training   PT Next Visit Plan Gait training: address trunk extension to compensate for decreased L hip/knee flexion during LLE advancement. NMR: closed chain activities for increased LLE proprioceptive input, proximal stability/control   Consulted and Agree with Plan of Care Patient        Problem List Patient Active Problem List   Diagnosis Date Noted  . Palpitations 01/24/2015  . Essential hypertension 01/24/2015  . Former smoker 01/24/2015  . Cardiomyopathy 01/24/2015  . Alteration of sensations, post-stroke 12/22/2014  . Cognitive deficit, post-stroke 11/23/2014  . Hypertension 10/31/2014  . Left hemiparesis 10/23/2014  . Cardiomyopathy due to hypertension 10/23/2014  . Chronic ischemic vertebrobasilar artery thalamic stroke   . Aortic valve vegetation   . Hypertensive heart disease    . Overweight (BMI 25.0-29.9)   . H/O noncompliance with medical treatment, presenting hazards to health   . Aortic regurgitation   . Tobacco use disorder 10/07/2014  . Polysubstance abuse 10/07/2014  . Cerebral infarction due to thrombosis of right middle cerebral artery   . Hyperlipidemia   . Embolic stroke involving right middle cerebral artery 10/06/2014   Cameron Sprang, PT, MPT [facility] [address] [place], [place], [address] Phone: [phone]   Fax:  [phone] 02/13/2015, 2:51 PM

## 2015-02-14 ENCOUNTER — Telehealth: Payer: Self-pay | Admitting: *Deleted

## 2015-02-14 NOTE — Telephone Encounter (Signed)
Patient called requesting med refill on hydrALAZINE (APRESOLINE) 50 MG tablet, and atorvastatin (LIPITOR) 10 MG tablet. Please f/u

## 2015-02-14 NOTE — Telephone Encounter (Signed)
Patient called to request a med refills for amLODipine (NORVASC) 10 MG tablet, hydrALAZINE (APRESOLINE) 50 MG tablet, and  atorvastatin (LIPITOR) 10 MG tablet. Please f/u with pt.

## 2015-02-14 NOTE — Telephone Encounter (Signed)
Brandy Roberts from [facility] office called to request NPI number for patient.  Pt has appt for heart monitor, ECHO.  NPI given x 3, pt needs to contact medicaid office to change provider information on card. Derl Barrow, RN

## 2015-02-15 ENCOUNTER — Ambulatory Visit: Payer: Medicaid Other | Admitting: Rehabilitation

## 2015-02-15 ENCOUNTER — Ambulatory Visit: Payer: Medicaid Other | Admitting: Occupational Therapy

## 2015-02-15 ENCOUNTER — Ambulatory Visit: Payer: Medicaid Other

## 2015-02-15 ENCOUNTER — Encounter: Payer: Self-pay | Admitting: Occupational Therapy

## 2015-02-15 VITALS — BP 124/70

## 2015-02-15 DIAGNOSIS — R482 Apraxia: Secondary | ICD-10-CM

## 2015-02-15 DIAGNOSIS — R29898 Other symptoms and signs involving the musculoskeletal system: Secondary | ICD-10-CM

## 2015-02-15 DIAGNOSIS — M25512 Pain in left shoulder: Secondary | ICD-10-CM

## 2015-02-15 DIAGNOSIS — R269 Unspecified abnormalities of gait and mobility: Secondary | ICD-10-CM | POA: Diagnosis not present

## 2015-02-15 DIAGNOSIS — R1310 Dysphagia, unspecified: Secondary | ICD-10-CM
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
## 2015-02-15 NOTE — Therapy (Signed)
[facility] [address] [place], [place], [address] Phone: [phone]   Fax:  [phone]  Speech Language Pathology Treatment  Patient Details  Name: Brandy Roberts MRN: [id number] Date of Birth: 1970-10-30 Referring Provider:  Lance Bosch, NP  Encounter Date: 02/15/2015      End of Session - 02/15/15 1529    Visit Number 2   Number of Visits 16   Date for SLP Re-Evaluation 04/09/15   SLP Start Time 94   SLP Stop Time  1402   SLP Time Calculation (min) 37 min   Activity Tolerance Patient tolerated treatment well      Past Medical History  Diagnosis Date  . Hypertension   . Stroke   . Hyperlipidemia     Past Surgical History  Procedure Laterality Date  . Cesarean section    . Tonsillectomy    . Ganglion cyst excision    . Tee without cardioversion N/A 10/09/2014    Procedure: TRANSESOPHAGEAL ECHOCARDIOGRAM (TEE);  Surgeon: Sanda Klein, MD;  Location: [facility] ENDOSCOPY;  Service: Cardiovascular;  Laterality: N/A;    There were no vitals filed for this visit.  Visit Diagnosis: Dysphagia  Verbal apraxia      Subjective Assessment - 02/15/15 1327    Subjective "Whenever I eat I just start coughin, ever since I got back from the hospital." Pt denies overt s/s aspiration PNA               ADULT SLP TREATMENT - 02/15/15 1328    Treatment Provided   Treatment provided Dysphagia   Dysphagia Treatment   Temperature Spikes Noted No   Respiratory Status Room air   Treatment Methods Compensation strategy training;Patient/caregiver education;Skilled observation   Patient observed directly with PO's Yes   Type of PO's observed Thin liquids   Feeding Able to feed self   Liquids provided via Cup   Oral Phase Signs & Symptoms Prolonged mastication;Prolonged bolus formation   Pharyngeal Phase Signs & Symptoms Immediate throat clear;Delayed throat clear;Watery eyes;Delayed cough   Other  treatment/comments Sluggish lingual and labial alternate motion tasks. Minmal lt labial margin retraction upon smiling. Pt has learned to piecemeal her larger bite sizes because "I have so much trouble swalloiwng (the food)." SLP encouraged her to take smaller bites to limit piecemeal swallowing. Limited overt s/s aspiration with cereal bar an dH2O but could be due to pt carefulness. Rec modified barium swallow.   Pain Assessment   Pain Assessment 0-10   Pain Score 6    Pain Location lt leg   Pain Descriptors / Indicators Tightness   Pain Intervention(s) Monitored during session   Dysphagia Recommendations   Diet recommendations Regular;Thin liquid   Liquids provided via Cup   Compensations Small sips/bites;Effortful swallow   Progression Toward Goals   Progression toward goals --  SLP rec modified barium swallow exam to Dr. Letta Pate          SLP Education - 02/15/15 1524    Education provided Yes   Education Details effortful swallow with all POs, effortful swallow 20x daily   Person(s) Educated Patient   Methods Explanation   Comprehension Verbalized understanding;Returned demonstration          SLP Short Term Goals - 02/15/15 1531    SLP SHORT TERM GOAL #1   Title pt will demo slowed rate in 15/20 sentences with rare min A   Baseline 0/5   Time 4   Period Weeks   Status On-going  SLP SHORT TERM GOAL #2   Title pt will demo HEP for apraxia/motor planning with occasinal min A   Baseline not provided yet   Time 4   Period Weeks   Status On-going   SLP SHORT TERM GOAL #3   Title pt will demo compensations adequate for 90% WNL speech in 3 mintues simple conversation with nonverbal cues   Baseline none provided today   Time 4   Period Weeks   Status On-going   SLP SHORT TERM GOAL #4   Title pt will perform HEP for swallowing with rare min A   Baseline mod A   Time 4   Period Weeks   Status New          SLP Long Term Goals - 02/15/15 1532    SLP LONG TERM  GOAL #1   Title pt will demo [facility] speech in 10 minutes mod complex conversation using modified independence (compensations)   Baseline compensations not provided yet   Time 8   Period Weeks  or 16 visits   Status On-going   SLP LONG TERM GOAL #2   Title pt will demo HEP for motor planning/apraxia with rare min A   Baseline not provided yet   Time 8   Period Weeks  or 16 visits   Status On-going   SLP LONG TERM GOAL #3   Title pt will demo simple WFL conversation for 7 minutes with mod ified independence (compensations)   Baseline 0 minutes   Time 8   Period Weeks  or 16 visits   Status On-going   SLP LONG TERM GOAL #4   Title HEP for swallowing with indpendence   Baseline mod A   Time 4   Period Weeks   Status New          Plan - 02/15/15 1529    Clinical Impression Statement Pt should have modified barium swallow exam, waiting to see what Dr. Letta Pate says re this. SLP added STG and LTG for swallowing and will add more goals PRN after possible modified. Pt is swallowing with effrot with POs and also engaging in effortful swallow 20x daily. Skilled ST needs to cont to improve pt's speech, as well as ID and treat swallowing deficit PRN.   Speech Therapy Frequency 2x / week   Treatment/Interventions Internal/external aids;Compensatory strategies;SLP instruction and feedback;Patient/family education;Functional tasks;Cueing hierarchy   Potential to Achieve Goals Good   Potential Considerations Co-morbidities        Problem List Patient Active Problem List   Diagnosis Date Noted  . Palpitations 01/24/2015  . Essential hypertension 01/24/2015  . Former smoker 01/24/2015  . Cardiomyopathy 01/24/2015  . Alteration of sensations, post-stroke 12/22/2014  . Cognitive deficit, post-stroke 11/23/2014  . Hypertension 10/31/2014  . Left hemiparesis 10/23/2014  . Cardiomyopathy due to hypertension 10/23/2014  . Chronic ischemic vertebrobasilar artery thalamic stroke   . Aortic  valve vegetation   . Hypertensive heart disease   . Overweight (BMI 25.0-29.9)   . H/O noncompliance with medical treatment, presenting hazards to health   . Aortic regurgitation   . Tobacco use disorder 10/07/2014  . Polysubstance abuse 10/07/2014  . Cerebral infarction due to thrombosis of right middle cerebral artery   . Hyperlipidemia   . Embolic stroke involving right middle cerebral artery 10/06/2014    [facility] , MS, CCC-SLP  02/15/2015, 3:35 PM  [facility] [address] [place], [place], [address] Phone: [phone]   Fax:  [phone]     

## 2015-02-15 NOTE — Patient Instructions (Signed)
  Hard swallows with all your meals  Small bites!!  20 hard swallows per day. You can use water if your mouth gets dry.

## 2015-02-15 NOTE — Addendum Note (Signed)
Addended by: Garald Balding B on: 02/15/2015 03:42 PM   Modules accepted: Orders

## 2015-02-19 ENCOUNTER — Ambulatory Visit: Payer: Medicaid Other | Admitting: Physical Therapy

## 2015-02-19 ENCOUNTER — Ambulatory Visit: Payer: Medicaid Other | Admitting: Occupational Therapy

## 2015-02-19 DIAGNOSIS — IMO0002 Reserved for concepts with insufficient information to code with codable children: Secondary | ICD-10-CM

## 2015-02-19 DIAGNOSIS — R269 Unspecified abnormalities of gait and mobility: Secondary | ICD-10-CM

## 2015-02-19 DIAGNOSIS — I69359 Hemiplegia and hemiparesis following cerebral infarction affecting unspecified side: Secondary | ICD-10-CM

## 2015-02-19 NOTE — Patient Instructions (Signed)
Fall Prevention and Home Safety °Falls cause injuries and can affect all age groups. It is possible to prevent falls.  °HOW TO PREVENT FALLS °· Wear shoes with rubber soles that do not have an opening for your toes. °· Keep the inside and outside of your house well lit. °· Use night lights throughout your home. °· Remove clutter from floors. °· Clean up floor spills. °· Remove throw rugs or fasten them to the floor with carpet tape. °· Do not place electrical cords across pathways. °· Put grab bars by your tub, shower, and toilet. Do not use towel bars as grab bars. °· Put handrails on both sides of the stairway. Fix loose handrails. °· Do not climb on stools or stepladders, if possible. °· Do not wax your floors. °· Repair uneven or unsafe sidewalks, walkways, or stairs. °· Keep items you use a lot within reach. °· Be aware of pets. °· Keep emergency numbers next to the telephone. °· Put smoke detectors in your home and near bedrooms. °Ask your doctor what other things you can do to prevent falls. °Document Released: 03/15/2009 Document Revised: 11/18/2011 Document Reviewed: 08/19/2011 °ExitCare® Patient Information ©2015 [employer]. This information is not intended to replace advice given to you by your health care provider. Make sure you discuss any questions you have with your health care provider. ° °

## 2015-02-19 NOTE — Therapy (Signed)
[facility] [address] [place], [place], [address] Phone: [phone]   Fax:  [phone]  Physical Therapy Treatment  Patient Details  Name: Brandy Roberts MRN: [id number] Date of Birth: November 28, 1970 Referring Provider:  Lance Bosch, NP  Encounter Date: 02/19/2015      PT End of Session - 02/19/15 1927    Visit Number 10   Number of Visits 17   Date for PT Re-Evaluation 02/24/15   Authorization Type [employer]   PT Start Time 1319   PT Stop Time 1400   PT Time Calculation (min) 41 min   Activity Tolerance Patient tolerated treatment well   Behavior During Therapy [facility] for tasks assessed/performed      Past Medical History  Diagnosis Date  . Hypertension   . Stroke   . Hyperlipidemia     Past Surgical History  Procedure Laterality Date  . Cesarean section    . Tonsillectomy    . Ganglion cyst excision    . Tee without cardioversion N/A 10/09/2014    Procedure: TRANSESOPHAGEAL ECHOCARDIOGRAM (TEE);  Surgeon: Sanda Klein, MD;  Location: [facility] ENDOSCOPY;  Service: Cardiovascular;  Laterality: N/A;    There were no vitals filed for this visit.  Visit Diagnosis:  Abnormality of gait  Lack of coordination due to stroke  Hemiparesis affecting nondominant side as late effect of cerebrovascular accident      Subjective Assessment - 02/19/15 1322    Subjective Pt denies falls; reports no pain.   Pertinent History CVA 10/06/14   Limitations Walking;House hold activities   Patient Stated Goals Pt's goal for therapy is to get mobility back for better walking.   Currently in Pain? No/denies                         [facility] Adult PT Treatment/Exercise - 02/19/15 0001    Ambulation/Gait   Ambulation/Gait Yes   Ambulation/Gait Assistance 4: Min guard;6: Modified independent (Device/Increase time)   Ambulation/Gait Assistance Details x130' with RW and mod I; x115' with SPC and min guard   Ambulation Distance (Feet) 245 Feet   Assistive device Rolling walker;Straight cane  L AFO   Gait Pattern Step-through pattern;Decreased stance time - left;Decreased step length - right;Decreased dorsiflexion - left;Decreased weight shift to left;Poor foot clearance - left   Ambulation Surface Level;Indoor   Gait Comments Explained, demonstrated gait pattern using SPC. Pt then performed gait x115' with SPC and min guard, cueing for increased L hip/knee flexion during LLE advancement, increasing pt attention to L heel strike.   Standardized Balance Assessment   Standardized Balance Assessment Berg Balance Test   Berg Balance Test   Sit to Stand Able to stand without using hands and stabilize independently   Standing Unsupported Able to stand safely 2 minutes   Sitting with Back Unsupported but Feet Supported on Floor or Stool Able to sit safely and securely 2 minutes   Stand to Sit Sits safely with minimal use of hands   Transfers Able to transfer safely, minor use of hands   Standing Unsupported with Eyes Closed Able to stand 10 seconds safely   Standing Ubsupported with Feet Together Able to place feet together independently and stand for 1 minute with supervision   From Standing, Reach Forward with Outstretched Arm Can reach confidently >25 cm (10")   From Standing Position, Pick up Object from Floor Able to pick up shoe safely and easily   From Standing Position,  Turn to Look Behind Over each Shoulder Looks behind one side only/other side shows less weight shift   Turn 360 Degrees Able to turn 360 degrees safely but slowly   Standing Unsupported, Alternately Place Feet on Step/Stool Able to stand independently and complete 8 steps >20 seconds  22 seconds   Standing Unsupported, One Foot in Front Able to plae foot ahead of the other independently and hold 30 seconds   Standing on One Leg Able to lift leg independently and hold equal to or more than 3 seconds   Total Score 48   Timed Up and  Go Test   TUG Normal TUG   Normal TUG (seconds) 21.51  average of 3 trials using RW   Neuro Re-ed    Neuro Re-ed Details  --                PT Education - 02/19/15 1928    Education provided Yes   Education Details Fall prevention strategies. Berg and TUG findings, progress, and functional implications.    Person(s) Educated Patient   Methods Explanation;Handout   Comprehension Verbalized understanding          PT Short Term Goals - 01/31/15 1129    PT SHORT TERM GOAL #1   Title Pt will perform HEP with family supervision for improved strength, balance, gait.  Target 01/24/15   Baseline Cueing from PT required, as no family present.   Time 4   Period Weeks   Status Partially Met   PT SHORT TERM GOAL #2   Title Pt will perform at least 8 of 10 reps of sit<>stand transfers with minimal to no UE support for improved transfer efficiency and safety.   Baseline 8/24: required (S)/cueing for initial 2 sit>stands; mod I for subsequent 8 reps   Time 4   Period Weeks   Status Achieved   PT SHORT TERM GOAL #3   Title Pt will improve Berg Balance score to at least 36/56 for decreased fall risk.   Baseline 8/31: Berg score = 42/56   Time 4   Period Weeks   Status Achieved   PT SHORT TERM GOAL #4   Title Pt will improve TUG score to less than or equal to 30 seconds for decreased fall risk.   Baseline Achieved 8/24. TUG = 27.38 (avg of 3 trials) using RW.   Time 4   Period Weeks   Status Achieved   PT SHORT TERM GOAL #5   Title Pt will verbalize understanding of CVA education   Baseline --   Time 4   Period Weeks   Status Not Met           PT Long Term Goals - 02/19/15 1323    PT LONG TERM GOAL #1   Title Pt will verbalize understanding of fall prevention within the home environment.  Target date 02/24/15   Baseline Pt at fall risk per Berg, TUG, gait velocity scores   Time 8   Period Weeks   Status On-going   PT LONG TERM GOAL #2   Title Pt will improve gait  velocity to at least 1.8 ft/sec for decreased risk of falls/improved gait efficiency.   Baseline gait velocity 1.07 ft/sec   Time 8   Period Weeks   Status On-going   PT LONG TERM GOAL #3   Title Pt will improve Berg Balance score to at least 41/56 for decreased fall risk.   Baseline Berg score 31/56 at eval.  9/19: Berg score = 48/56.   Time 8   Period Weeks   Status Achieved   PT LONG TERM GOAL #4   Title Pt will improve TUG score to less than or equal to 20 seconds for decreased fall risk.   Baseline TUG score 36.24 sec at eval.  9/19: 21.51 sec with RW.   Time 8   Period Weeks   Status Not Met   PT LONG TERM GOAL #5   Title Pt will ambulate household distances, at least 50-100 ft using cane, modified independently, for improved household gait.   Baseline Gait 50 ft with RW at eval   Time 8   Period Weeks   Status On-going               Plan - 02/19/15 1930    Clinical Impression Statement LTG for [facility] Scale met, as pt has improved score from 31/56 to 48/56 since PT evaluation. Although pt did exhibit improved TUG time (from 36.24 to 21.51 seconds since evaluation), pt did not reach goal level of </= 20 seconds on TUG. Initiated gait training with SPC. Noted improved L knee control (less genu recurvatum) during this session.  Continue per POC.   Pt will benefit from skilled therapeutic intervention in order to improve on the following deficits Abnormal gait;Decreased balance;Decreased endurance;Decreased safety awareness;Decreased mobility;Decreased strength;Difficulty walking   Rehab Potential Good   PT Frequency 2x / week   PT Duration 8 weeks   PT Treatment/Interventions ADLs/Self Care Home Management;Gait training;Neuromuscular re-education;Balance training;Therapeutic exercise;Therapeutic activities;Functional mobility training;Patient/family education;Orthotic Fit/Training   PT Next Visit Plan Finish checking LTG's and renew, if appropriate.   Consulted and  Agree with Plan of Care Patient        Problem List Patient Active Problem List   Diagnosis Date Noted  . Palpitations 01/24/2015  . Essential hypertension 01/24/2015  . Former smoker 01/24/2015  . Cardiomyopathy 01/24/2015  . Alteration of sensations, post-stroke 12/22/2014  . Cognitive deficit, post-stroke 11/23/2014  . Hypertension 10/31/2014  . Left hemiparesis 10/23/2014  . Cardiomyopathy due to hypertension 10/23/2014  . Chronic ischemic vertebrobasilar artery thalamic stroke   . Aortic valve vegetation   . Hypertensive heart disease   . Overweight (BMI 25.0-29.9)   . H/O noncompliance with medical treatment, presenting hazards to health   . Aortic regurgitation   . Tobacco use disorder 10/07/2014  . Polysubstance abuse 10/07/2014  . Cerebral infarction due to thrombosis of right middle cerebral artery   . Hyperlipidemia   . Embolic stroke involving right middle cerebral artery 10/06/2014    Billie Ruddy, PT, DPT [facility] [address] [place], [place], [address] Phone: [phone]   Fax:  [phone] 02/19/2015, 7:38 PM

## 2015-02-20 ENCOUNTER — Encounter: Payer: Medicaid Other | Attending: Physical Medicine & Rehabilitation

## 2015-02-20 ENCOUNTER — Ambulatory Visit (HOSPITAL_BASED_OUTPATIENT_CLINIC_OR_DEPARTMENT_OTHER): Payer: No Typology Code available for payment source | Admitting: Physical Medicine & Rehabilitation

## 2015-02-20 ENCOUNTER — Encounter: Payer: Self-pay | Admitting: Physical Medicine & Rehabilitation

## 2015-02-20 ENCOUNTER — Other Ambulatory Visit: Payer: Self-pay | Admitting: Neurology

## 2015-02-20 ENCOUNTER — Ambulatory Visit (HOSPITAL_COMMUNITY): Payer: No Typology Code available for payment source

## 2015-02-20 ENCOUNTER — Ambulatory Visit: Payer: No Typology Code available for payment source

## 2015-02-20 VITALS — BP 132/68 | HR 69

## 2015-02-20 DIAGNOSIS — I6931 Cognitive deficits following cerebral infarction: Secondary | ICD-10-CM

## 2015-02-20 DIAGNOSIS — I69391 Dysphagia following cerebral infarction: Secondary | ICD-10-CM | POA: Diagnosis not present

## 2015-02-20 DIAGNOSIS — R270 Ataxia, unspecified: Secondary | ICD-10-CM | POA: Diagnosis not present

## 2015-02-20 DIAGNOSIS — I69319 Unspecified symptoms and signs involving cognitive functions following cerebral infarction: Secondary | ICD-10-CM

## 2015-02-20 DIAGNOSIS — I63431 Cerebral infarction due to embolism of right posterior cerebral artery: Secondary | ICD-10-CM | POA: Insufficient documentation

## 2015-02-20 DIAGNOSIS — G8190 Hemiplegia, unspecified affecting unspecified side: Secondary | ICD-10-CM | POA: Diagnosis not present

## 2015-02-20 DIAGNOSIS — I639 Cerebral infarction, unspecified: Secondary | ICD-10-CM

## 2015-02-20 DIAGNOSIS — G8194 Hemiplegia, unspecified affecting left nondominant side: Secondary | ICD-10-CM

## 2015-02-20 DIAGNOSIS — I10 Essential (primary) hypertension: Secondary | ICD-10-CM | POA: Diagnosis not present

## 2015-02-20 MED ORDER — GABAPENTIN 300 MG PO CAPS: 300.0000 mg | ORAL_CAPSULE | Freq: Every day | ORAL | Status: DC

## 2015-02-20 NOTE — Patient Instructions (Addendum)
Swallow test they will call you

## 2015-02-20 NOTE — Progress Notes (Signed)
Subjective:    Patient ID: Brandy Roberts, female    DOB: [birth year], 44 y.o.   MRN: [id number] Admitted Oct 06, 2014, with decreased balance, left-sided weakness. Blood pressure 196/88. Urine drug screen positive for marijuana. MRI of the brain showed acute lacunar infarct, lateral right thalamus, posterior limb of right external capsule, and also right corona radiata. Echocardiogram with ejection fraction of 88% grade 1 diastolic dysfunction. CT angiogram of head and neck with no acute findings. The patient did not receive tPA. Venous Doppler studies of lower extremities negative. HPI Patient feels like she has problems with her short-term memory. She is having difficulty remembering what people tell her. She is coughing with some of her meals has not had any pneumonia or respiratory infections recently Has been attending outpatient PT OT and speech therapy  Not driving Has not returned to work since her stroke  Pain Inventory Average Pain 4 Pain Right Now 4 My pain is tingling  In the last 24 hours, has pain interfered with the following? General activity 5 Relation with others 5 Enjoyment of life 5 What TIME of day is your pain at its worst? night Sleep (in general) Good  Pain is worse with: walking and some activites Pain improves with: medication Relief from Meds: no answer  Mobility use a walker how many minutes can you walk? 10 do you drive?  no  Function I need assistance with the following:  household duties  Neuro/Psych trouble walking  Prior Studies Any changes since last visit?  no  Physicians involved in your care Any changes since last visit?  no   Family History  Problem Relation Age of Onset  . Cancer Mother   . Hypertension Mother   . Hypertension Sister    Social History   Social History  . Marital Status: Single    Spouse Name: N/A  . Number of Children: N/A  . Years of Education: N/A   Social History Main Topics  .  Smoking status: Former Smoker    Quit date: 11/01/2014  . Smokeless tobacco: Never Used  . Alcohol Use: No  . Drug Use: None  . Sexual Activity: Not Asked   Other Topics Concern  . 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 Cesarean section    . Tonsillectomy    . Ganglion cyst excision    . Tee without cardioversion N/A 10/09/2014    Procedure: TRANSESOPHAGEAL ECHOCARDIOGRAM (TEE);  Surgeon: Sanda Klein, MD;  Location: [facility] ENDOSCOPY;  Service: Cardiovascular;  Laterality: N/A;   Past Medical History  Diagnosis Date  . Hypertension   . Stroke   . Hyperlipidemia    LMP 01/17/2015  Opioid Risk Score:   Fall Risk Score:  `1  Depression screen PHQ 2/9  Depression screen [facility] 2/9 02/20/2015 11/30/2014 11/23/2014 10/31/2014 10/23/2014  Decreased Interest 3 3 2  0 2  Down, Depressed, Hopeless 3 - 3 0 1  PHQ - 2 Score 6 3 5  0 3  Altered sleeping - 3 2 - 3  Tired, decreased energy - 3 3 - 3  Change in appetite - - 1 - 1  Feeling bad or failure about yourself  - 0 0 - 2  Trouble concentrating - 1 2 - 0  Moving slowly or fidgety/restless - - 0 - 1  Suicidal thoughts - - 0 - 0  PHQ-9 Score - 10 13 -  13     Review of Systems  Endocrine:       High blood sugar  All other systems reviewed and are negative.      Objective:   Physical Exam  Constitutional: She is oriented to person, place, and time. She appears well-developed and well-nourished.  HENT:  Head: Normocephalic and atraumatic.  Eyes: Conjunctivae and EOM are normal. Pupils are equal, round, and reactive to light.  Neck: Normal range of motion.  Cardiovascular: Normal rate, regular rhythm, normal heart sounds and intact distal pulses.   Pulmonary/Chest: Effort normal and breath sounds normal.  Abdominal: Soft. Bowel sounds are normal.  Neurological: She is alert and oriented to person, place, and time.  Psychiatric: She has a  normal mood and affect.  Nursing note and vitals reviewed.   Motor strength is 2 minus in the left ankle dorsiflexor, 4 minus in the left hip flexor knee extensor left deltoid bicep tricep grip 5/5 on the right side in the same muscle groups       Assessment & Plan:  1. Left hemiparesis secondary to right thalamic, posterior limb of internal capsule as well as corona radiata infarct. This is due to small vessel disease. She is independent with her self-care and mobility using a walker however still unable to drive and unable to work.  I recommend she finishes her PT OT and speech. We'll reassess her readiness for driving next month.  Given both the mobility as well as cognitive issues I doubt that she'll be able to return to work.  2. Dysphagia after stroke, unusual  Given that she was doing well in the hospital with her diet and now is complaining of coughing with meals once again. Agree with repeat modified barium swallow . 3. Cognitive deficits after stroke multiple infarct , fortunately mainly subcortical so they may improve with time. We'll continue to monitor, follow-up in one month

## 2015-02-21 ENCOUNTER — Ambulatory Visit: Payer: Medicaid Other | Attending: Internal Medicine | Admitting: Internal Medicine

## 2015-02-21 ENCOUNTER — Encounter: Payer: Self-pay | Admitting: Internal Medicine

## 2015-02-21 VITALS — BP 137/78 | HR 76 | Temp 98.0°F | Resp 16 | Ht 66.0 in | Wt 179.6 lb

## 2015-02-21 DIAGNOSIS — I10 Essential (primary) hypertension: Secondary | ICD-10-CM | POA: Insufficient documentation

## 2015-02-21 DIAGNOSIS — Z8673 Personal history of transient ischemic attack (TIA), and cerebral infarction without residual deficits: Secondary | ICD-10-CM | POA: Diagnosis not present

## 2015-02-21 DIAGNOSIS — I69391 Dysphagia following cerebral infarction: Secondary | ICD-10-CM | POA: Diagnosis not present

## 2015-02-21 DIAGNOSIS — R5383 Other fatigue: Secondary | ICD-10-CM | POA: Insufficient documentation

## 2015-02-21 LAB — COMPLETE METABOLIC PANEL WITH GFR
ALBUMIN: 4.2 g/dL (ref 3.6–5.1)
ALK PHOS: 62 U/L (ref 33–115)
ALT: 11 U/L (ref 6–29)
AST: 15 U/L (ref 10–35)
BILIRUBIN TOTAL: 0.2 mg/dL (ref 0.2–1.2)
BUN: 26 mg/dL — ABNORMAL HIGH (ref 7–25)
CO2: 28 mmol/L (ref 20–31)
Calcium: 9 mg/dL (ref 8.6–10.2)
Chloride: 103 mmol/L (ref 98–110)
Creat: 0.99 mg/dL (ref 0.50–1.10)
GFR, EST AFRICAN AMERICAN: 80 mL/min (ref >=60)
GFR, EST NON AFRICAN AMERICAN: 69 mL/min (ref >=60)
Glucose, Bld: 87 mg/dL (ref 65–99)
Potassium: 3.6 mmol/L (ref 3.5–5.3)
Sodium: 141 mmol/L (ref 135–146)
TOTAL PROTEIN: 7.6 g/dL (ref 6.1–8.1)

## 2015-02-21 LAB — CBC WITH DIFFERENTIAL/PLATELET
BASOS ABS: 0 10*3/uL (ref 0.0–0.1)
BASOS PCT: 0 % (ref 0–1)
EOS ABS: 0.1 10*3/uL (ref 0.0–0.7)
EOS PCT: 2 % (ref 0–5)
HCT: 35 % — ABNORMAL LOW (ref 36.0–46.0)
Hemoglobin: 11.6 g/dL — ABNORMAL LOW (ref 12.0–15.0)
Lymphocytes Relative: 32 % (ref 12–46)
Lymphs Abs: 2.3 10*3/uL (ref 0.7–4.0)
MCH: 29.8 pg (ref 26.0–34.0)
MCHC: 33.1 g/dL (ref 30.0–36.0)
MCV: 90 fL (ref 78.0–100.0)
MONOS PCT: 9 % (ref 3–12)
MPV: 10.3 fL (ref 8.6–12.4)
Monocytes Absolute: 0.6 10*3/uL (ref 0.1–1.0)
NEUTROS PCT: 57 % (ref 43–77)
Neutro Abs: 4 10*3/uL (ref 1.7–7.7)
PLATELETS: 215 10*3/uL (ref 150–400)
RBC: 3.89 MIL/uL (ref 3.87–5.11)
RDW: 13.7 % (ref 11.5–15.5)
WBC: 7.1 10*3/uL (ref 4.0–10.5)

## 2015-02-21 LAB — TSH: TSH: 0.688 u[IU]/mL (ref 0.350–4.500)

## 2015-02-21 LAB — T4, FREE: Free T4: 0.89 ng/dL (ref 0.80–1.80)

## 2015-02-21 MED ORDER — AMLODIPINE BESYLATE 10 MG PO TABS: 10.0000 mg | ORAL_TABLET | Freq: Every day | ORAL | Status: DC

## 2015-02-21 MED ORDER — CLOPIDOGREL BISULFATE 75 MG PO TABS: 75.0000 mg | ORAL_TABLET | Freq: Every day | ORAL | Status: DC

## 2015-02-21 MED ORDER — SPIRONOLACTONE 50 MG PO TABS: 50.0000 mg | ORAL_TABLET | Freq: Every day | ORAL | Status: DC

## 2015-02-21 MED ORDER — METOPROLOL TARTRATE 50 MG PO TABS: 50.0000 mg | ORAL_TABLET | Freq: Two times a day (BID) | ORAL | Status: DC

## 2015-02-21 MED ORDER — HYDRALAZINE HCL 50 MG PO TABS: 50.0000 mg | ORAL_TABLET | Freq: Three times a day (TID) | ORAL | Status: DC

## 2015-02-21 MED ORDER — LISINOPRIL 20 MG PO TABS: 20.0000 mg | ORAL_TABLET | Freq: Two times a day (BID) | ORAL | Status: DC

## 2015-02-21 MED ORDER — HYDROCHLOROTHIAZIDE 25 MG PO TABS: 25.0000 mg | ORAL_TABLET | Freq: Every day | ORAL | Status: DC

## 2015-02-21 MED ORDER — HYDRALAZINE HCL 50 MG PO TABS: ORAL_TABLET | ORAL | Status: DC

## 2015-02-21 NOTE — Progress Notes (Signed)
Patient ID: Brandy Roberts, female   DOB: 10-31-1970, 44 y.o.   MRN: [id number]  CC: follow up  HPI: Brandy Roberts is a 44 y.o. female here today for a follow up visit.  Patient has past medical history of HTN, CVA (10/2014), HLD. Patient reports that she has continued to receive PT and speech therapy. She has a swallow study schedule soon for persistent dysphagia and choking since her stroke. Patient reports mild improvement in left sided weakness. She is able to ambulate with walker. She denies falls. Patient notes that she has been suffering from fatigue for several months and believes she may be suffering from anemia.  She reports that she takes her medications daily without complications. She is requesting medication refills today.  No Known Allergies Past Medical History  Diagnosis Date  . Hypertension   . Stroke   . Hyperlipidemia    Current Outpatient Prescriptions on File Prior to Visit  Medication Sig Dispense Refill  . atorvastatin (LIPITOR) 10 MG tablet TAKE 1 TABLET BY MOUTH DAILY AT 6 PM 30 tablet 2  . gabapentin (NEURONTIN) 300 MG capsule Take 1 capsule (300 mg total) by mouth at bedtime. 30 capsule 1  . traMADol (ULTRAM) 50 MG tablet Take 1 tablet (50 mg total) by mouth every 6 (six) hours as needed (Headache). 60 tablet 0   No current facility-administered medications on file prior to visit.   Family History  Problem Relation Age of Onset  . Cancer Mother   . Hypertension Mother   . Hypertension Sister    Social History   Social History  . Marital Status: Single    Spouse Name: N/A  . Number of Children: N/A  . Years of Education: N/A   Occupational History  . Not on file.   Social History Main Topics  . Smoking status: Former Smoker    Quit date: 11/01/2014  . Smokeless tobacco: Never Used  . Alcohol Use: No  . Drug Use: Not on file  . Sexual Activity: Not on file   Other Topics Concern  . Not on file   Social History Narrative   Works as a  Secretary/administrator at the [employer]: Other than what is stated in HPI, all other systems are negative.   Objective:   Filed Vitals:   02/21/15 1654  BP: 137/78  Pulse: 76  Temp: 98 F (36.7 C)  Resp: 16    Physical Exam  Constitutional: She is oriented to person, place, and time.  Cardiovascular: Normal rate, regular rhythm and normal heart sounds.   Pulmonary/Chest: Effort normal and breath sounds normal.  Abdominal: Soft. Bowel sounds are normal.  Neurological: She is alert and oriented to person, place, and time. She exhibits abnormal muscle tone (left sided).  Full ROM of left side but has weak grip   Skin: Skin is warm and dry.     Lab Results  Component Value Date   WBC 7.2 10/23/2014   HGB 14.1 10/23/2014   HCT 42.4 10/23/2014   MCV 86.4 10/23/2014   PLT 214 10/23/2014   Lab Results  Component Value Date   CREATININE 1.01* 10/23/2014   BUN 26* 10/23/2014   NA 135 10/23/2014   K 3.4* 10/23/2014   CL 102 10/23/2014   CO2 23 10/23/2014    Lab Results  Component Value Date   HGBA1C 5.8* 10/07/2014   Lipid Panel     Component Value Date/Time   CHOL 179 10/07/2014 4098  TRIG 105 10/07/2014 0613   HDL 45 10/07/2014 0613   CHOLHDL 4.0 10/07/2014 0613   VLDL 21 10/07/2014 0613   LDLCALC 113* 10/07/2014 0613       Assessment and plan:   Brandy Roberts was seen today for follow-up.  Diagnoses and all orders for this visit:  Essential hypertension -     amLODipine (NORVASC) 10 MG tablet; Take 1 tablet (10 mg total) by mouth daily. -     lisinopril (PRINIVIL,ZESTRIL) 20 MG tablet; Take 1 tablet (20 mg total) by mouth 2 (two) times daily. -     hydrochlorothiazide (HYDRODIURIL) 25 MG tablet; Take 1 tablet (25 mg total) by mouth daily. -     metoprolol (LOPRESSOR) 50 MG tablet; Take 1 tablet (50 mg total) by mouth 2 (two) times daily. -     hydrALAZINE (APRESOLINE) 50 MG tablet; Take 1 tablet (50 mg total) by mouth 3 (three) times daily with  meals. -     spironolactone (ALDACTONE) 50 MG tablet; Take 1 tablet (50 mg total) by mouth daily. -     COMPLETE METABOLIC PANEL WITH GFR Patient blood pressure is stable and may continue on current medication.  Education on diet, exercise, and modifiable risk factors discussed. Will obtain appropriate labs as needed. Will follow up in 3-6 months.   History of CVA (cerebrovascular accident) -     clopidogrel (PLAVIX) 75 MG tablet; Take 1 tablet (75 mg total) by mouth daily. Stable, continue medication   Dysphagia, post-stroke Advised patient to sit up right with PO intake. Take small bites and swallow completely before next bit. Do not use straws for now. Get swallow eval  Other fatigue -     CBC with Differential -     TSH -     T4, Free  Return in about 3 months (around 05/23/2015) for Hypertension.        Lance Bosch, [facility] [phone] 02/21/2015, 4:56 PM

## 2015-02-21 NOTE — Progress Notes (Signed)
Patient here for follow up on her HTN and cholesterol  Patient is also in need of refills on all her medications Medications sent to pharmacy so patient can have them before Pharmacy closes

## 2015-02-22 ENCOUNTER — Ambulatory Visit: Payer: Medicaid Other | Admitting: Occupational Therapy

## 2015-02-22 ENCOUNTER — Other Ambulatory Visit (HOSPITAL_COMMUNITY): Payer: Self-pay | Admitting: Physical Medicine & Rehabilitation

## 2015-02-22 ENCOUNTER — Ambulatory Visit: Payer: Medicaid Other | Admitting: Physical Therapy

## 2015-02-22 ENCOUNTER — Ambulatory Visit: Payer: Medicaid Other | Admitting: Speech Pathology

## 2015-02-22 DIAGNOSIS — R482 Apraxia: Secondary | ICD-10-CM

## 2015-02-22 DIAGNOSIS — R269 Unspecified abnormalities of gait and mobility: Secondary | ICD-10-CM

## 2015-02-22 DIAGNOSIS — I69359 Hemiplegia and hemiparesis following cerebral infarction affecting unspecified side: Secondary | ICD-10-CM

## 2015-02-22 DIAGNOSIS — M25512 Pain in left shoulder: Secondary | ICD-10-CM

## 2015-02-22 DIAGNOSIS — R29898 Other symptoms and signs involving the musculoskeletal system: Secondary | ICD-10-CM

## 2015-02-22 DIAGNOSIS — R1314 Dysphagia, pharyngoesophageal phase: Secondary | ICD-10-CM

## 2015-02-22 NOTE — Therapy (Signed)
[facility] [address] [place], [place], [address] Phone: [phone]   Fax:  [phone]  Physical Therapy Treatment  Patient Details  Name: Brandy Roberts MRN: [id number] Date of Birth: 11/22/1970 Referring Provider:  Lance Bosch, NP  Encounter Date: 02/22/2015      PT End of Session - 02/22/15 1951    Visit Number 11   Number of Visits 19  2x/week for additional 4 weeks   Date for PT Re-Evaluation 03/24/15   Authorization Type [employer]   PT Start Time 1402   PT Stop Time 1445   PT Time Calculation (min) 43 min   Activity Tolerance Patient tolerated treatment well   Behavior During Therapy [facility] for tasks assessed/performed      Past Medical History  Diagnosis Date  . Hypertension   . Stroke   . Hyperlipidemia     Past Surgical History  Procedure Laterality Date  . Cesarean section    . Tonsillectomy    . Ganglion cyst excision    . Tee without cardioversion N/A 10/09/2014    Procedure: TRANSESOPHAGEAL ECHOCARDIOGRAM (TEE);  Surgeon: Sanda Klein, MD;  Location: [facility] ENDOSCOPY;  Service: Cardiovascular;  Laterality: N/A;    There were no vitals filed for this visit.  Visit Diagnosis:  Abnormality of gait - Plan: PT plan of care cert/re-cert  Hemiparesis affecting nondominant side as late effect of cerebrovascular accident - Plan: PT plan of care cert/re-cert      Subjective Assessment - 02/22/15 1942    Subjective "I got denied for disability and Dr. Letta Pate doesn't think I should go back to work yet. I don't know what to do because I'm financially depending on my family and friends."   Pertinent History CVA 10/06/14   Limitations Walking;House hold activities   Patient Stated Goals 9/22: "to walk with a cane," and "to go back to work"   Currently in Pain? No/denies                         [facility] Adult PT Treatment/Exercise - 02/22/15 0001    Ambulation/Gait   Ambulation/Gait Yes   Ambulation/Gait Assistance 5: Supervision;4: Min guard;4: Min assist   Ambulation/Gait Assistance Details x150' outdoors; remaining distance indoors   Ambulation Distance (Feet) 400 Feet   Assistive device Straight cane  L AFO   Gait Pattern Step-through pattern;Decreased stance time - left;Decreased step length - right;Decreased weight shift to left;Poor foot clearance - left;Decreased hip/knee flexion - left;Decreased dorsiflexion - left;Left foot flat;Narrow base of support;Left hip hike  narrow BOS intermittent during gait trials   Ambulation Surface Level;Unlevel;Indoor;Outdoor;Paved   Gait velocity 1.77 ft/sec   Gait Comments Cueing focused on increasing L hip/knee flexion, L heel strike for consistent LLE clearance; forward gaze; and safe placement of SPC (too close to RLE, as times)                PT Education - 02/22/15 1943    Education provided Yes   Education Details Goals, progress, and POC.   Person(s) Educated Patient   Methods Explanation   Comprehension Verbalized understanding          PT Short Term Goals - 01/31/15 1129    PT SHORT TERM GOAL #1   Title Pt will perform HEP with family supervision for improved strength, balance, gait.  Target 01/24/15   Baseline Cueing from PT required, as no family present.   Time 4   Period  Weeks   Status Partially Met   PT SHORT TERM GOAL #2   Title Pt will perform at least 8 of 10 reps of sit<>stand transfers with minimal to no UE support for improved transfer efficiency and safety.   Baseline 8/24: required (S)/cueing for initial 2 sit>stands; mod I for subsequent 8 reps   Time 4   Period Weeks   Status Achieved   PT SHORT TERM GOAL #3   Title Pt will improve Berg Balance score to at least 36/56 for decreased fall risk.   Baseline 8/31: Berg score = 42/56   Time 4   Period Weeks   Status Achieved   PT SHORT TERM GOAL #4   Title Pt will improve TUG score to less than or equal to 30 seconds  for decreased fall risk.   Baseline Achieved 8/24. TUG = 27.38 (avg of 3 trials) using RW.   Time 4   Period Weeks   Status Achieved   PT SHORT TERM GOAL #5   Title Pt will verbalize understanding of CVA education   Baseline --   Time 4   Period Weeks   Status Not Met           PT Long Term Goals - 02/22/15 1953    PT LONG TERM GOAL #1   Title Pt will verbalize understanding of fall prevention within the home environment.  Modified target date: 03/22/15.   Baseline Pt at fall risk per Berg, TUG, gait velocity scores  Continue goal through renewed POC   Time 8   Period Weeks   Status Not Met   PT LONG TERM GOAL #2   Title Pt will improve gait velocity to at least 1.8 ft/sec for decreased risk of falls/improved gait efficiency. Modified target date: 03/22/15.   Baseline 9/22: gait velocity = 1.77 ft/sec with RW  Continue goal through renewed POC   Time 8   Period Weeks   Status Not Met   PT LONG TERM GOAL #3   Title Pt will improve Berg Balance score to at least 41/56 for decreased fall risk.   Baseline 9/19: Berg score 48/56.   Time 8   Period Weeks   Status Achieved   PT LONG TERM GOAL #4   Title Pt will improve TUG score to less than or equal to 20 seconds for decreased fall risk. Modified target date: 03/22/15.   Baseline  9/19: 21.51 sec with RW.  Continue goal through renewed POC   Time 8   Period Weeks   Status Not Met   PT LONG TERM GOAL #5   Title Pt will ambulate household distances, at least 50-100 ft using cane, modified independently, for improved household gait. Modified target date: 03/22/15.   Baseline 200' with SPC requiring supervision to min guard  Continue goal through renewed POC   Time 8   Period Weeks   Status Not Met   Additional Long Term Goals   Additional Long Term Goals Yes   PT LONG TERM GOAL #6   Title Pt will ambulate 300' over unlevel, paved surfaces with mod I using SPC to indicate increased stability/independence with limited  community mobility. Target date: 03/22/15.   Status New   PT LONG TERM GOAL #7   Title Pt will negotiate standard ramp and curb step with mod I using SPC to indicate safety traversing community obstacles. Target date: 03/22/15.   Status New  Plan - 02/22/15 2007    Clinical Impression Statement Pt met 2 of 5 LTG's, indicating decreased fall risk (as Berg score increased from 31 to 48/56) and understanding of fall prevention strategies in home environment. Pt did exhibit improvement in Timed Up and Go Test; however, TUG was not at goal-level. LTG's added to progress toward PLOF, to address pt-stated goal to utilize single point cane. Pt will continue to benefit from outpatient PT 2x/week for 4 additional weeks to maximize safety with functional mobility, progress toward return to PLOF, and decrease fall risk.   Pt will benefit from skilled therapeutic intervention in order to improve on the following deficits Abnormal gait;Decreased balance;Decreased endurance;Decreased safety awareness;Decreased mobility;Decreased strength;Difficulty walking   Rehab Potential Good   PT Frequency 2x / week   PT Duration 4 weeks   PT Treatment/Interventions ADLs/Self Care Home Management;Gait training;Neuromuscular re-education;Balance training;Therapeutic exercise;Therapeutic activities;Functional mobility training;Patient/family education;Orthotic Fit/Training   PT Next Visit Plan Progress HEP; specifically, consider adding home exercise for L hip flexion for LLE advancement without L hip hiking.   Consulted and Agree with Plan of Care Patient        Problem List Patient Active Problem List   Diagnosis Date Noted  . Dysphagia, post-stroke 02/20/2015  . Palpitations 01/24/2015  . Essential hypertension 01/24/2015  . Former smoker 01/24/2015  . Cardiomyopathy 01/24/2015  . Alteration of sensations, post-stroke 12/22/2014  . Cognitive deficit, post-stroke 11/23/2014  . Hypertension  10/31/2014  . Left hemiparesis 10/23/2014  . Cardiomyopathy due to hypertension 10/23/2014  . Chronic ischemic vertebrobasilar artery thalamic stroke   . Aortic valve vegetation   . Hypertensive heart disease   . Overweight (BMI 25.0-29.9)   . H/O noncompliance with medical treatment, presenting hazards to health   . Aortic regurgitation   . Tobacco use disorder 10/07/2014  . Polysubstance abuse 10/07/2014  . Cerebral infarction due to thrombosis of right middle cerebral artery   . Hyperlipidemia   . Embolic stroke involving right middle cerebral artery 10/06/2014    Billie Ruddy, PT, DPT [facility] [address] [place], [place], [address] Phone: [phone]   Fax:  [phone] 02/22/2015, 8:15 PM

## 2015-02-22 NOTE — Therapy (Signed)
[facility] [address] [place], [place], [address] Phone: [phone]   Fax:  [phone]  Occupational Therapy Treatment  Patient Details  Name: Brandy Roberts MRN: [id number] Date of Birth: May 21, 1971 Referring Leightyn Cina:  Lance Bosch, NP  Encounter Date: 02/22/2015      OT End of Session - 02/22/15 1648    Visit Number 11   Number of Visits 17   Date for OT Re-Evaluation 02/25/15   Authorization Type [employer]   Authorization Time Period week 6/8   OT Start Time 1320   OT Stop Time 1400   OT Time Calculation (min) 40 min   Activity Tolerance Patient tolerated treatment well      Past Medical History  Diagnosis Date  . Hypertension   . Stroke   . Hyperlipidemia     Past Surgical History  Procedure Laterality Date  . Cesarean section    . Tonsillectomy    . Ganglion cyst excision    . Tee without cardioversion N/A 10/09/2014    Procedure: TRANSESOPHAGEAL ECHOCARDIOGRAM (TEE);  Surgeon: Sanda Klein, MD;  Location: [facility] ENDOSCOPY;  Service: Cardiovascular;  Laterality: N/A;    There were no vitals filed for this visit.  Visit Diagnosis:  Hemiparesis affecting nondominant side as late effect of cerebrovascular accident  Left arm weakness  Pain in shoulder region, left      Subjective Assessment - 02/22/15 1330    Subjective  I didn't go to the hospital b/c I felt much better after I left here our last session. I have the swallow test on 02/28/15   Pertinent History See EPIC snapshot   Limitations    Patient Stated Goals walking better, get mobility back in my arm and leg   Currently in Pain? No/denies  none in UE's            [facility] OT Assessment - 02/22/15 0001    Coordination   Left 9 Hole Peg Test 37.01 sec.                   OT Treatments/Exercises (OP) - 02/22/15 0001    ADLs   ADL Comments Assessed coordination. Pt with slight decline on 9 hole peg test. Pt also  appeared to have coarse voice today and clearing throat t/o session. Therapist informed speech therapist. Assessed Kinesiotape - pt reports no significant improvement. Therapist also questioned pt about following up with MD after our last session, but pt did not f/u with MD reporting, "I went home and rested, and felt much better"   Neurological Re-education Exercises   Shoulder Flexion Supine;AROM;20 reps  with 2 lbs. each hand with cues for positioning   Other Weight-Bearing Exercises 1 Quadraped with A-P wt shifts, but pt quickly fatigued LUE   Other Weight-Bearing Exercises 2 Modified plank on elbows x 5 reps, holding 5 sec.                   OT Short Term Goals - 02/15/15 1712    OT SHORT TERM GOAL #1   Title Independent w/ initial HEP for coordination and grip strength (due 01/25/15)   Status Achieved   OT SHORT TERM GOAL #2   Title Improve LUE function as demo by performing 38 blocks or > on Box & Blocks test   Status On-going  01/24/16 - 35 BLOCKS. 02/15/15 - 33 blocks   OT SHORT TERM GOAL #3   Title Mod I level with hooking bra, donning  socks, and tying shoes with A/E and/or compensatory strategies prn   Status Achieved   OT SHORT TERM GOAL #4   Title Improve grip strength Lt hand to 35 lbs to assist in opening containers   Status Partially Met  42, 35,33 lbs. However when last assessed 02/13/15 = 30 lbs   OT SHORT TERM GOAL #5   Title Pt independent with HEP for vision (tracking and convergence)   Status Achieved           OT Long Term Goals - 02/09/15 1611    OT LONG TERM GOAL #1   Title Independent w/ updated HEP for LUE strengthening (due 02/25/15)   Time 8   Period Weeks   Status New   OT LONG TERM GOAL #2   Title Place 3 # object on high shelf LUE 5/5 trials w/o rest   Baseline eval = unable, 3/5 on MMT (NO resistance tolerated)   Time 8   Period Weeks   Status New   OT LONG TERM GOAL #3   Title Improve coordination as evidenced by performing 9 hole  peg test in 28 sec. or less   Baseline eval = 36.75 sec. (Rt = 23.35 sec)   Time 8   Period Weeks   Status New   OT LONG TERM GOAL #4   Title Improve grip strength Lt hand to 45 lbs or greater to assist w/ opening containers/jars   Baseline eval = 20 (Rt = 75 lbs)   Time 8   Period Weeks   Status New   OT LONG TERM GOAL #5   Title Pt to return to IADL tasks safely Mod I level (cooking/cleaning)   Time 8   Period Weeks   Status New   OT LONG TERM GOAL #6   Title Updated cognition goal TBD   Time 8   Period Weeks   Status Deferred  speech therapy referral made to address               Plan - 02/22/15 1648    Clinical Impression Statement Pt with slight decline in coordination however unremarkable change (only 1 sec. slower with 9 hole peg test). Pt is not showing progress therapist had hoped, but also feel cognitive impairments may be contributing to poor carry over at home.    Plan continue coordination, grip strength, functional reaching LUE   OT Home Exercise Plan 01/15/15: Coordination and putty HEP. 01/18/15: Visual HEP. 01/22/15: LUE strengthening HEP and added convergence/divergence to vision HEP; updated HEP for LUE 01/22/15   Consulted and Agree with Plan of Care Patient        Problem List Patient Active Problem List   Diagnosis Date Noted  . Dysphagia, post-stroke 02/20/2015  . Palpitations 01/24/2015  . Essential hypertension 01/24/2015  . Former smoker 01/24/2015  . Cardiomyopathy 01/24/2015  . Alteration of sensations, post-stroke 12/22/2014  . Cognitive deficit, post-stroke 11/23/2014  . Hypertension 10/31/2014  . Left hemiparesis 10/23/2014  . Cardiomyopathy due to hypertension 10/23/2014  . Chronic ischemic vertebrobasilar artery thalamic stroke   . Aortic valve vegetation   . Hypertensive heart disease   . Overweight (BMI 25.0-29.9)   . H/O noncompliance with medical treatment, presenting hazards to health   . Aortic regurgitation   . Tobacco  use disorder 10/07/2014  . Polysubstance abuse 10/07/2014  . Cerebral infarction due to thrombosis of right middle cerebral artery   . Hyperlipidemia   . Embolic stroke involving right middle cerebral artery  10/06/2014    Carey Bullocks, OTR/L 02/22/2015, 4:51 PM  [facility] [address] [place], [place], [address] Phone: [phone]   Fax:  [phone]

## 2015-02-22 NOTE — Therapy (Signed)
[facility] [address] [place], [place], [address] Phone: [phone]   Fax:  [phone]  Speech Language Pathology Treatment  Patient Details  Name: Brandy Roberts MRN: [id number] Date of Birth: 1971/01/22 Referring Provider:  Lance Bosch, NP  Encounter Date: 02/22/2015      End of Session - 02/22/15 1318    Visit Number 3   Number of Visits 16   Date for SLP Re-Evaluation 04/09/15   SLP Start Time 1232   SLP Stop Time  1316   SLP Time Calculation (min) 44 min      Past Medical History  Diagnosis Date  . Hypertension   . Stroke   . Hyperlipidemia     Past Surgical History  Procedure Laterality Date  . Cesarean section    . Tonsillectomy    . Ganglion cyst excision    . Tee without cardioversion N/A 10/09/2014    Procedure: TRANSESOPHAGEAL ECHOCARDIOGRAM (TEE);  Surgeon: Sanda Klein, MD;  Location: [facility] ENDOSCOPY;  Service: Cardiovascular;  Laterality: N/A;    There were no vitals filed for this visit.  Visit Diagnosis: Dysphagia, pharyngoesophageal phase - Plan: SLP modified barium swallow  Verbal apraxia      Subjective Assessment - 02/22/15 1253    Subjective "I'm waiting for someone to call me re: a swallow study b/c I get to coughing when I eat and drink"               ADULT SLP TREATMENT - 02/22/15 1254    Dysphagia Treatment   Type of PO's observed Thin liquids   Pharyngeal Phase Signs & Symptoms Immediate throat clear;Immediate cough   Type of cueing Verbal   Amount of cueing Minimal   Pain Assessment   Pain Assessment 0-10   Pain Score 6    Pain Location Lt.let   Pain Descriptors / Indicators Tightness   Pain Intervention(s) Monitored during session   Cognitive-Linquistic Treatment   Treatment focused on Apraxia   Skilled Treatment Facilitated compensations for verbal apraxia with multisyllabic words using slow rate with rare min A,  Pt demonstrated swallow  precautions with rare min A, using  effortful swallow, small sips with rare min A.    Assessment / Recommendations / Plan   Plan Continue with current plan of care   Dysphagia Recommendations   Diet recommendations Regular   Liquids provided via Cup   Compensations Small sips/bites;Effortful swallow   Progression Toward Goals   Progression toward goals Progressing toward goals            SLP Short Term Goals - 02/22/15 1318    SLP SHORT TERM GOAL #1   Title pt will demo slowed rate in 15/20 sentences with rare min A   Baseline 0/5   Time 4   Period Weeks   Status On-going   SLP SHORT TERM GOAL #2   Title pt will demo HEP for apraxia/motor planning with occasinal min A   Baseline not provided yet   Time 4   Period Weeks   Status On-going   SLP SHORT TERM GOAL #3   Title pt will demo compensations adequate for 90% WNL speech in 3 mintues simple conversation with nonverbal cues   Baseline none provided today   Time 4   Period Weeks   Status On-going   SLP SHORT TERM GOAL #4   Title pt will perform HEP for swallowing with rare min A   Baseline mod A  Time 4   Period Weeks   Status New          SLP Long Term Goals - 02/22/15 1318    SLP LONG TERM GOAL #1   Title pt will demo [facility] speech in 10 minutes mod complex conversation using modified independence (compensations)   Baseline compensations not provided yet   Time 8   Period Weeks  or 16 visits   Status On-going   SLP LONG TERM GOAL #2   Title pt will demo HEP for motor planning/apraxia with rare min A   Baseline not provided yet   Time 8   Period Weeks  or 16 visits   Status On-going   SLP LONG TERM GOAL #3   Title pt will demo simple WFL conversation for 7 minutes with mod ified independence (compensations)   Baseline 0 minutes   Time 8   Period Weeks  or 16 visits   Status On-going   SLP LONG TERM GOAL #4   Title HEP for swallowing with indpendence   Baseline mod A   Time 4   Period Weeks    Status New          Plan - 02/22/15 1305    Clinical Impression Statement Pt continues to c/o "getting choked" with meals, PO trials with s/s of dysphagia with consistent immediate cough or throat clear. Pt using effortful swallow with PO's with rare min A. Compensations for mild verbal apraxia with rare min A.  She reports she is getting a calendar today to recall appts. MBSS scheduled for 02/28/15   Speech Therapy Frequency 2x / week   Treatment/Interventions Internal/external aids;Compensatory strategies;SLP instruction and feedback;Patient/family education;Functional tasks;Cueing hierarchy   Potential to Achieve Goals Good   Potential Considerations Co-morbidities   Consulted and Agree with Plan of Care Patient        Problem List Patient Active Problem List   Diagnosis Date Noted  . Dysphagia, post-stroke 02/20/2015  . Palpitations 01/24/2015  . Essential hypertension 01/24/2015  . Former smoker 01/24/2015  . Cardiomyopathy 01/24/2015  . Alteration of sensations, post-stroke 12/22/2014  . Cognitive deficit, post-stroke 11/23/2014  . Hypertension 10/31/2014  . Left hemiparesis 10/23/2014  . Cardiomyopathy due to hypertension 10/23/2014  . Chronic ischemic vertebrobasilar artery thalamic stroke   . Aortic valve vegetation   . Hypertensive heart disease   . Overweight (BMI 25.0-29.9)   . H/O noncompliance with medical treatment, presenting hazards to health   . Aortic regurgitation   . Tobacco use disorder 10/07/2014  . Polysubstance abuse 10/07/2014  . Cerebral infarction due to thrombosis of right middle cerebral artery   . Hyperlipidemia   . Embolic stroke involving right middle cerebral artery 10/06/2014    Lovvorn, Annye Rusk MS, CCC-SLP 02/22/2015, 2:57 PM  [facility] [address] [place], [place], [address] Phone: [phone]   Fax:  [phone]

## 2015-02-23 ENCOUNTER — Ambulatory Visit: Payer: Medicaid Other

## 2015-02-23 ENCOUNTER — Telehealth: Payer: Self-pay

## 2015-02-23 ENCOUNTER — Ambulatory Visit: Payer: No Typology Code available for payment source | Admitting: Physical Medicine & Rehabilitation

## 2015-02-23 NOTE — Telephone Encounter (Signed)
Attempted to call patient about her blood pressure medicaton Patient was not available Left message on voice mail to return our call

## 2015-02-26 ENCOUNTER — Ambulatory Visit (HOSPITAL_COMMUNITY): Payer: Medicaid Other | Attending: Neurology

## 2015-02-26 ENCOUNTER — Telehealth: Payer: Self-pay

## 2015-02-26 ENCOUNTER — Other Ambulatory Visit: Payer: Self-pay

## 2015-02-26 ENCOUNTER — Ambulatory Visit (INDEPENDENT_AMBULATORY_CARE_PROVIDER_SITE_OTHER): Payer: Medicaid Other

## 2015-02-26 DIAGNOSIS — I639 Cerebral infarction, unspecified: Secondary | ICD-10-CM | POA: Diagnosis present

## 2015-02-26 DIAGNOSIS — R002 Palpitations: Secondary | ICD-10-CM

## 2015-02-26 DIAGNOSIS — I517 Cardiomegaly: Secondary | ICD-10-CM | POA: Insufficient documentation

## 2015-02-26 DIAGNOSIS — I10 Essential (primary) hypertension: Secondary | ICD-10-CM | POA: Diagnosis not present

## 2015-02-26 DIAGNOSIS — I6340 Cerebral infarction due to embolism of unspecified cerebral artery: Secondary | ICD-10-CM

## 2015-02-26 DIAGNOSIS — I351 Nonrheumatic aortic (valve) insufficiency: Secondary | ICD-10-CM | POA: Insufficient documentation

## 2015-02-26 NOTE — Telephone Encounter (Signed)
-----   Message from Lance Bosch, NP sent at 02/26/2015  1:02 PM EDT ----- Her hemoglobin is dropping slightly. Has she noticed any bleeding in her stools or had heavy bleeding. I do not feel she needs iron tablets at this time. All other labs are normal

## 2015-02-26 NOTE — Telephone Encounter (Signed)
Spoke with patient and she is aware of her lab results Patient is not having issues with bleeding at this moment

## 2015-02-27 ENCOUNTER — Other Ambulatory Visit (HOSPITAL_COMMUNITY): Payer: No Typology Code available for payment source

## 2015-02-27 ENCOUNTER — Telehealth: Payer: Self-pay

## 2015-02-27 NOTE — Telephone Encounter (Signed)
Attempted to call patient  Patient not available Unable to leave voice mail Mail box is not set up

## 2015-02-27 NOTE — Telephone Encounter (Signed)
Patient returned call. Please f/u with pt.

## 2015-02-28 ENCOUNTER — Ambulatory Visit (HOSPITAL_COMMUNITY)
Admission: RE | Admit: 2015-02-28 | Discharge: 2015-02-28 | Disposition: A | Discharge status: Home / Self Care | Payer: Medicaid Other | Source: Ambulatory Visit | Attending: Physical Medicine & Rehabilitation | Admitting: Physical Medicine & Rehabilitation

## 2015-02-28 DIAGNOSIS — R1314 Dysphagia, pharyngoesophageal phase: Secondary | ICD-10-CM | POA: Diagnosis present

## 2015-03-02 ENCOUNTER — Telehealth: Payer: Self-pay

## 2015-03-02 NOTE — Telephone Encounter (Signed)
-----   Message from Desmond Lope, RN sent at 02/27/2015  2:24 PM EDT -----   ----- Message -----    From: Rosalin Hawking, MD    Sent: 02/27/2015   1:58 PM      To: [facility] Triage Pool  Hello, could you please let her know that her 2D echo did the other day was unremarkable. Continue current treatment plan. Thanks.  Rosalin Hawking, MD PhD Stroke Neurology 02/27/2015 1:58 PM

## 2015-03-05 NOTE — Telephone Encounter (Signed)
Unable to reach patient. Will send letter.

## 2015-03-07 ENCOUNTER — Ambulatory Visit: Payer: Medicaid Other | Admitting: Occupational Therapy

## 2015-03-07 ENCOUNTER — Ambulatory Visit: Payer: Medicaid Other | Attending: Physical Medicine & Rehabilitation | Admitting: Physical Therapy

## 2015-03-07 ENCOUNTER — Encounter: Payer: Self-pay | Admitting: Occupational Therapy

## 2015-03-07 ENCOUNTER — Telehealth: Payer: Self-pay | Admitting: Physical Therapy

## 2015-03-07 ENCOUNTER — Ambulatory Visit: Payer: Medicaid Other

## 2015-03-07 VITALS — HR 86

## 2015-03-07 DIAGNOSIS — R279 Unspecified lack of coordination: Secondary | ICD-10-CM | POA: Diagnosis present

## 2015-03-07 DIAGNOSIS — R482 Apraxia: Secondary | ICD-10-CM

## 2015-03-07 DIAGNOSIS — R269 Unspecified abnormalities of gait and mobility: Secondary | ICD-10-CM | POA: Insufficient documentation

## 2015-03-07 DIAGNOSIS — I69398 Other sequelae of cerebral infarction: Secondary | ICD-10-CM

## 2015-03-07 DIAGNOSIS — R29898 Other symptoms and signs involving the musculoskeletal system: Secondary | ICD-10-CM | POA: Insufficient documentation

## 2015-03-07 DIAGNOSIS — R1310 Dysphagia, unspecified: Secondary | ICD-10-CM | POA: Insufficient documentation

## 2015-03-07 DIAGNOSIS — I69359 Hemiplegia and hemiparesis following cerebral infarction affecting unspecified side: Secondary | ICD-10-CM | POA: Insufficient documentation

## 2015-03-07 DIAGNOSIS — IMO0002 Reserved for concepts with insufficient information to code with codable children: Secondary | ICD-10-CM

## 2015-03-07 DIAGNOSIS — I6980 Unspecified sequelae of other cerebrovascular disease: Secondary | ICD-10-CM | POA: Diagnosis present

## 2015-03-07 NOTE — Therapy (Signed)
[facility] [address] [place], [place], [address] Phone: [phone]   Fax:  [phone]  Speech Language Pathology Treatment  Patient Details  Name: Brandy Roberts MRN: [id number] Date of Birth: 11-18-1970 Referring Provider:  Lance Bosch, NP  Encounter Date: 03/07/2015    Past Medical History  Diagnosis Date  . Hypertension   . Stroke ([place])   . Hyperlipidemia     Past Surgical History  Procedure Laterality Date  . Cesarean section    . Tonsillectomy    . Ganglion cyst excision    . Tee without cardioversion N/A 10/09/2014    Procedure: TRANSESOPHAGEAL ECHOCARDIOGRAM (TEE);  Surgeon: Sanda Klein, MD;  Location: [facility] ENDOSCOPY;  Service: Cardiovascular;  Laterality: N/A;    There were no vitals filed for this visit.  Visit Diagnosis: Dysphagia  Verbal apraxia      Subjective Assessment - 03/07/15 0941    Subjective "They said my pills wouldn't go down."(re: modified barium swallow exam)   "I feel horrible. I thought about cancelling today."       After approx 7 minutes of therapy time discussing results from modified barium swallow exam, pt began coughing and continued for 60-90 seconds. Pt again reported she did not feel well, and SLP asked pt if she would like to return home. She answered affirmatively and SLP agreed it would be best for pt to go home to rest. SLP encouraged pt to call to cancel tomorrow's appointment if she does not feel any better by approx 6 pm this evening.            SLP Short Term Goals - 02/22/15 1318    SLP SHORT TERM GOAL #1   Title pt will demo slowed rate in 15/20 sentences with rare min A   Baseline 0/5   Time 4   Period Weeks   Status On-going   SLP SHORT TERM GOAL #2   Title pt will demo HEP for apraxia/motor planning with occasinal min A   Baseline not provided yet   Time 4   Period Weeks   Status On-going   SLP SHORT TERM GOAL #3   Title pt  will demo compensations adequate for 90% WNL speech in 3 mintues simple conversation with nonverbal cues   Baseline none provided today   Time 4   Period Weeks   Status On-going   SLP SHORT TERM GOAL #4   Title pt will perform HEP for swallowing with rare min A   Baseline mod A   Time 4   Period Weeks   Status New          SLP Long Term Goals - 02/22/15 1318    SLP LONG TERM GOAL #1   Title pt will demo [facility] speech in 10 minutes mod complex conversation using modified independence (compensations)   Baseline compensations not provided yet   Time 8   Period Weeks  or 16 visits   Status On-going   SLP LONG TERM GOAL #2   Title pt will demo HEP for motor planning/apraxia with rare min A   Baseline not provided yet   Time 8   Period Weeks  or 16 visits   Status On-going   SLP LONG TERM GOAL #3   Title pt will demo simple WFL conversation for 7 minutes with mod ified independence (compensations)   Baseline 0 minutes   Time 8   Period Weeks  or 16 visits   Status On-going  SLP LONG TERM GOAL #4   Title HEP for swallowing with indpendence   Baseline mod A   Time 4   Period Weeks   Status New          Problem List Patient Active Problem List   Diagnosis Date Noted  . Dysphagia, post-stroke 02/20/2015  . Palpitations 01/24/2015  . Essential hypertension 01/24/2015  . Former smoker 01/24/2015  . Cardiomyopathy ([place]) 01/24/2015  . Alteration of sensations, post-stroke 12/22/2014  . Cognitive deficit, post-stroke 11/23/2014  . Hypertension 10/31/2014  . Left hemiparesis ([place]) 10/23/2014  . Cardiomyopathy due to hypertension ([place]) 10/23/2014  . Chronic ischemic vertebrobasilar artery thalamic stroke   . Aortic valve vegetation   . Hypertensive heart disease   . Overweight (BMI 25.0-29.9)   . H/O noncompliance with medical treatment, presenting hazards to health   . Aortic regurgitation   . Tobacco use disorder 10/07/2014  . Polysubstance abuse 10/07/2014  .  Cerebral infarction due to thrombosis of right middle cerebral artery ([place])   . Hyperlipidemia   . Embolic stroke involving right middle cerebral artery ([place]) 10/06/2014    [facility] , [place], [place]  03/07/2015, 9:54 AM  [facility] [address] [place], [place], [address] Phone: [phone]   Fax:  [phone]

## 2015-03-07 NOTE — Telephone Encounter (Signed)
Dr. Letta Pate,  Ms. Alderman has been utilizing a single point cane during outpatient PT sessions. Pt would benefit from a personal single point cane to begin short-distance household ambulation using cane.  If you agree, please submit an order for a single point cane.  Thank you,  Billie Ruddy, PT, DPT [facility] [address] [place], [place], [address] Phone: [phone]   Fax:  [phone] 03/07/2015, 9:35 AM

## 2015-03-07 NOTE — Patient Instructions (Addendum)
Abduction: Clam (Eccentric) - Side-Lying   Make sure your left ankle brace is off for this exercise.   Lie on RIGHT side with knees bent. Hold onto the edge of the bed with your top (left) hand to prevent your hips from moving. Lift top (LEFT) knee, keeping feet together. Keep trunk steady. Slowly lower for 2-3 seconds.   Perform 10 repetitions, 3 times per day.   Walking Program  Begin walking in a safe place using your rolling walker and ankle brace. You may choose to walk outside due to the heat/humidity. Try to walk over level ground without obstacles in the way.  Start by walking 7 consecutive minutes, 1-2 times per day. Add 1 minute per week.          Heel Slide    While lying on back, bend left knee and pull heel toward buttocks. Repeat __15__ times. Do _3_ sessions per day.

## 2015-03-07 NOTE — Therapy (Signed)
[facility] [address] [place], [place], [address] Phone: [phone]   Fax:  [phone]  Physical Therapy Treatment  Patient Details  Name: Brandy Roberts MRN: [id number] Date of Birth: [birth year] Referring Provider:  Lance Bosch, NP  Encounter Date: 03/07/2015      PT End of Session - 03/07/15 2025    Visit Number 12   Number of Visits 19   Date for PT Re-Evaluation 03/24/15   Authorization Type [employer]   PT Start Time [id number]   PT Stop Time 0931   PT Time Calculation (min) 44 min   Activity Tolerance Other (comment);Patient limited by fatigue  Pt limited by coughing, general fatigue   Behavior During Therapy [facility] for tasks assessed/performed      Past Medical History  Diagnosis Date  . Hypertension   . Stroke ([place])   . Hyperlipidemia     Past Surgical History  Procedure Laterality Date  . Cesarean section    . Tonsillectomy    . Ganglion cyst excision    . Tee without cardioversion N/A 10/09/2014    Procedure: TRANSESOPHAGEAL ECHOCARDIOGRAM (TEE);  Surgeon: Sanda Klein, MD;  Location: [facility] ENDOSCOPY;  Service: Cardiovascular;  Laterality: N/A;    Filed Vitals:   03/07/15 0850  Pulse: 86  SpO2: 98%    Visit Diagnosis:  Abnormality of gait  Hemiparesis affecting nondominant side as late effect of cerebrovascular accident [facility])      Subjective Assessment - 03/07/15 0850    Subjective Pt with sore throat, nasal congestion, and coughing today. States, "I just don't feel good." Pt requesting to try to do therapy (wearing mask) because she is already here. When asked about heart monitor pt is wearing today, pt replied, "They think I might've had another stroke." Pt reports consistent compliance with only one home exercise (clamshell). Pt feels as though she could realistically perform 2 home exercises per day.   Pertinent History CVA 10/06/14   Limitations Walking;House hold activities   Patient  Stated Goals 9/22: "to walk with a cane," and "to go back to work"   Currently in Pain? Yes   Pain Score 7    Pain Location Leg   Pain Orientation Proximal;Left   Pain Descriptors / Indicators Shooting   Pain Type Chronic pain   Pain Onset More than a month ago   Pain Frequency Constant   Aggravating Factors  standing and walking   Pain Relieving Factors laying down and resting it, putting my leg up   Effect of Pain on Daily Activities limits walking   Multiple Pain Sites No                         [facility] Adult PT Treatment/Exercise - 03/07/15 2029    Ambulation/Gait   Ambulation/Gait Yes   Ambulation/Gait Assistance 6: Modified independent (Device/Increase time)   Ambulation Distance (Feet) 525 Feet  total   Assistive device Rolling walker   Gait Pattern Step-through pattern;Decreased stance time - left;Decreased step length - right;Decreased weight shift to left;Poor foot clearance - left;Decreased hip/knee flexion - left;Decreased dorsiflexion - left;Left foot flat;Narrow base of support;Left hip hike  narrow BOS intermittent during gait trials   Ambulation Surface Level;Indoor   Gait Comments In attempt to re-initiate home waking program (to increase pt endurance), pt ambulated for 7 minutes with standing rest breaks every 30-45 seconds due to fatigue and pt feeling ill. See Pt Instructions for details on home  walking program.   Exercises   Exercises Other Exercises   Other Exercises  Pt performed L clamshells with mod I using paper handout. With demonstration, cueing from this PT, pt effectively performed supine L heel slides (unable to complete supine L SLR due to hip flexor weakness); see Pt Instructions for details. With 50% cueing, pt performed L hip flexor stretch 3 x 60-second holds. Pt required frequent rest breaks, per pt request, due to fatigue and excessive coughing.                PT Education - 03/07/15 2026    Education provided Yes    Education Details Recommending pt see primary MD due to feeling ill, excessive coughing, and general fatigue. Updated HEP to 2 exercises and walking program to promote consistent pt compliance.   Person(s) Educated Patient   Methods Explanation;Demonstration;Verbal cues;Handout;Tactile cues   Comprehension Verbalized understanding;Returned demonstration          PT Short Term Goals - 01/31/15 1129    PT SHORT TERM GOAL #1   Title Pt will perform HEP with family supervision for improved strength, balance, gait.  Target 01/24/15   Baseline Cueing from PT required, as no family present.   Time 4   Period Weeks   Status Partially Met   PT SHORT TERM GOAL #2   Title Pt will perform at least 8 of 10 reps of sit<>stand transfers with minimal to no UE support for improved transfer efficiency and safety.   Baseline 8/24: required (S)/cueing for initial 2 sit>stands; mod I for subsequent 8 reps   Time 4   Period Weeks   Status Achieved   PT SHORT TERM GOAL #3   Title Pt will improve Berg Balance score to at least 36/56 for decreased fall risk.   Baseline 8/31: Berg score = 42/56   Time 4   Period Weeks   Status Achieved   PT SHORT TERM GOAL #4   Title Pt will improve TUG score to less than or equal to 30 seconds for decreased fall risk.   Baseline Achieved 8/24. TUG = 27.38 (avg of 3 trials) using RW.   Time 4   Period Weeks   Status Achieved   PT SHORT TERM GOAL #5   Title Pt will verbalize understanding of CVA education   Baseline --   Time 4   Period Weeks   Status Not Met           PT Long Term Goals - 02/22/15 1953    PT LONG TERM GOAL #1   Title Pt will verbalize understanding of fall prevention within the home environment.  Modified target date: 03/22/15.   Baseline Pt at fall risk per Berg, TUG, gait velocity scores  Continue goal through renewed POC   Time 8   Period Weeks   Status Not Met   PT LONG TERM GOAL #2   Title Pt will improve gait velocity to at least  1.8 ft/sec for decreased risk of falls/improved gait efficiency. Modified target date: 03/22/15.   Baseline 9/22: gait velocity = 1.77 ft/sec with RW  Continue goal through renewed POC   Time 8   Period Weeks   Status Not Met   PT LONG TERM GOAL #3   Title Pt will improve Berg Balance score to at least 41/56 for decreased fall risk.   Baseline 9/19: Berg score 48/56.   Time 8   Period Weeks   Status Achieved   PT LONG  TERM GOAL #4   Title Pt will improve TUG score to less than or equal to 20 seconds for decreased fall risk. Modified target date: 03/22/15.   Baseline  9/19: 21.51 sec with RW.  Continue goal through renewed POC   Time 8   Period Weeks   Status Not Met   PT LONG TERM GOAL #5   Title Pt will ambulate household distances, at least 50-100 ft using cane, modified independently, for improved household gait. Modified target date: 03/22/15.   Baseline 200' with SPC requiring supervision to min guard  Continue goal through renewed POC   Time 8   Period Weeks   Status Not Met   Additional Long Term Goals   Additional Long Term Goals Yes   PT LONG TERM GOAL #6   Title Pt will ambulate 300' over unlevel, paved surfaces with mod I using SPC to indicate increased stability/independence with limited community mobility. Target date: 03/22/15.   Status New   PT LONG TERM GOAL #7   Title Pt will negotiate standard ramp and curb step with mod I using SPC to indicate safety traversing community obstacles. Target date: 03/22/15.   Status New               Plan - 03/07/15 2034    Clinical Impression Statement Skilled session focused on modifying HEP to promote consistent pt compliance. Also, re-addressed walking program to increase functional endurance/activity tolerance. Pt participation limited by excessive coughing and pt feeling ill during this session. Continue per POC.   Pt will benefit from skilled therapeutic intervention in order to improve on the following deficits  Abnormal gait;Decreased balance;Decreased endurance;Decreased safety awareness;Decreased mobility;Decreased strength;Difficulty walking   Rehab Potential Good   PT Frequency 2x / week   PT Duration 4 weeks   PT Treatment/Interventions ADLs/Self Care Home Management;Gait training;Neuromuscular re-education;Balance training;Therapeutic exercise;Therapeutic activities;Functional mobility training;Patient/family education;Orthotic Fit/Training   PT Next Visit Plan If pt safe with household ambulation using SPC, provide SPC from facility supply closet (fill out order form and have pt sign).   Recommended Other Services Received MD order for [facility] on 10/5.    Consulted and Agree with Plan of Care Patient        Problem List Patient Active Problem List   Diagnosis Date Noted  . Dysphagia, post-stroke 02/20/2015  . Palpitations 01/24/2015  . Essential hypertension 01/24/2015  . Former smoker 01/24/2015  . Cardiomyopathy ([place]) 01/24/2015  . Alteration of sensations, post-stroke 12/22/2014  . Cognitive deficit, post-stroke 11/23/2014  . Hypertension 10/31/2014  . Left hemiparesis ([place]) 10/23/2014  . Cardiomyopathy due to hypertension ([place]) 10/23/2014  . Chronic ischemic vertebrobasilar artery thalamic stroke   . Aortic valve vegetation   . Hypertensive heart disease   . Overweight (BMI 25.0-29.9)   . H/O noncompliance with medical treatment, presenting hazards to health   . Aortic regurgitation   . Tobacco use disorder 10/07/2014  . Polysubstance abuse 10/07/2014  . Cerebral infarction due to thrombosis of right middle cerebral artery ([place])   . Hyperlipidemia   . Embolic stroke involving right middle cerebral artery ([place]) 10/06/2014   Billie Ruddy, PT, DPT [facility] [address] [place], [place], [address] Phone: [phone]   Fax:  [phone] 03/07/2015, 8:39 PM

## 2015-03-07 NOTE — Therapy (Signed)
[place] [address] [place], [place], [address] Phone: [phone]   Fax:  [phone]  Occupational Therapy Treatment  Patient Details  Name: Brandy Roberts MRN: [id number] Date of Birth: 1970/08/10 Referring Provider:  Lance Bosch, NP  Encounter Date: 03/07/2015      OT End of Session - 03/07/15 0850    Visit Number 12   Number of Visits 17   Authorization Type [employer]   Authorization Time Period week 7/8   OT Start Time 0805   OT Stop Time 0845   OT Time Calculation (min) 40 min   Activity Tolerance Patient limited by fatigue  Pt also limited by aspiration/coughing      Past Medical History  Diagnosis Date  . Hypertension   . Stroke ([place])   . Hyperlipidemia     Past Surgical History  Procedure Laterality Date  . Cesarean section    . Tonsillectomy    . Ganglion cyst excision    . Tee without cardioversion N/A 10/09/2014    Procedure: TRANSESOPHAGEAL ECHOCARDIOGRAM (TEE);  Surgeon: Sanda Klein, MD;  Location: [facility] ENDOSCOPY;  Service: Cardiovascular;  Laterality: N/A;    There were no vitals filed for this visit.  Visit Diagnosis:  Hemiparesis affecting nondominant side as late effect of cerebrovascular accident [facility])  Left arm weakness  Lack of coordination due to stroke      Subjective Assessment - 03/07/15 0807    Subjective  I have a heart monitor now; the Dr. suspects a new stroke   Pertinent History See EPIC snapshot   Limitations **heart monitor   Patient Stated Goals walking better, get mobility back in my arm and leg   Currently in Pain? No/denies                      OT Treatments/Exercises (OP) - 03/07/15 0001    Hand Exercises   Other Hand Exercises Gripper set at 25 lbs. resistance to pick up blocks Lt hand with mod difficulty and multiple rest breaks due to fatigue in shoulder and hand and soreness in shoulder   Functional Reaching Activities   High  Level High level reaching to place small pegs in pegboard to copy design (for coordination and LUE endurance), then removing with multiple short rest breaks LUE d/t fatigue and decreased LUE strength/endurance. Pt with min drops                  OT Short Term Goals - 02/15/15 1712    OT SHORT TERM GOAL #1   Title Independent w/ initial HEP for coordination and grip strength (due 01/25/15)   Status Achieved   OT SHORT TERM GOAL #2   Title Improve LUE function as demo by performing 38 blocks or > on Box & Blocks test   Status On-going  01/24/16 - 35 BLOCKS. 02/15/15 - 33 blocks   OT SHORT TERM GOAL #3   Title Mod I level with hooking bra, donning socks, and tying shoes with A/E and/or compensatory strategies prn   Status Achieved   OT SHORT TERM GOAL #4   Title Improve grip strength Lt hand to 35 lbs to assist in opening containers   Status Partially Met  42, 35,33 lbs. However when last assessed 02/13/15 = 30 lbs   OT SHORT TERM GOAL #5   Title Pt independent with HEP for vision (tracking and convergence)   Status Achieved  OT Long Term Goals - 02/09/15 1611    OT LONG TERM GOAL #1   Title Independent w/ updated HEP for LUE strengthening (due 02/25/15)   Time 8   Period Weeks   Status New   OT LONG TERM GOAL #2   Title Place 3 # object on high shelf LUE 5/5 trials w/o rest   Baseline eval = unable, 3/5 on MMT (NO resistance tolerated)   Time 8   Period Weeks   Status New   OT LONG TERM GOAL #3   Title Improve coordination as evidenced by performing 9 hole peg test in 28 sec. or less   Baseline eval = 36.75 sec. (Rt = 23.35 sec)   Time 8   Period Weeks   Status New   OT LONG TERM GOAL #4   Title Improve grip strength Lt hand to 45 lbs or greater to assist w/ opening containers/jars   Baseline eval = 20 (Rt = 75 lbs)   Time 8   Period Weeks   Status New   OT LONG TERM GOAL #5   Title Pt to return to IADL tasks safely Mod I level (cooking/cleaning)    Time 8   Period Weeks   Status New   OT LONG TERM GOAL #6   Title Updated cognition goal TBD   Time 8   Period Weeks   Status Deferred  speech therapy referral made to address               Plan - 03/07/15 0852    Clinical Impression Statement Pt reports Modified Barium Swallow was ok and only recommendation was to crush medications. Pt still has aspiration/coughing during therapy sessions; advised to call MD. Pt also now has heart monitor and pt reports neurologist suspects a possible new CVA.    Plan Continue LUE strength/endurance, coordination   OT Home Exercise Plan 01/15/15: Coordination and putty HEP. 01/18/15: Visual HEP. 01/22/15: LUE strengthening HEP and added convergence/divergence to vision HEP; updated HEP for LUE 01/22/15   Consulted and Agree with Plan of Care Patient        Problem List Patient Active Problem List   Diagnosis Date Noted  . Dysphagia, post-stroke 02/20/2015  . Palpitations 01/24/2015  . Essential hypertension 01/24/2015  . Former smoker 01/24/2015  . Cardiomyopathy [name] 01/24/2015  . Alteration of sensations, post-stroke 12/22/2014  . Cognitive deficit, post-stroke 11/23/2014  . Hypertension 10/31/2014  . Left hemiparesis [name] 10/23/2014  . Cardiomyopathy due to hypertension [name] 10/23/2014  . Chronic ischemic vertebrobasilar artery thalamic stroke   . Aortic valve vegetation   . Hypertensive heart disease   . Overweight (BMI 25.0-29.9)   . H/O noncompliance with medical treatment, presenting hazards to health   . Aortic regurgitation   . Tobacco use disorder 10/07/2014  . Polysubstance abuse 10/07/2014  . Cerebral infarction due to thrombosis of right middle cerebral artery [name]   . Hyperlipidemia   . Embolic stroke involving right middle cerebral artery [name] 10/06/2014    Ballie, Kelly Johnson, OTR/L 03/07/2015, 1:37 PM  [facility] [address] [place],  [place], [address] Phone: [phone]   Fax:  [phone]    

## 2015-03-07 NOTE — Addendum Note (Signed)
Addended by: Charlett Blake on: 03/07/2015 10:31 AM   Modules accepted: Orders

## 2015-03-08 ENCOUNTER — Encounter: Payer: Self-pay | Admitting: Occupational Therapy

## 2015-03-08 ENCOUNTER — Ambulatory Visit: Payer: Medicaid Other | Admitting: Physical Therapy

## 2015-03-08 ENCOUNTER — Ambulatory Visit: Payer: Medicaid Other | Admitting: Occupational Therapy

## 2015-03-08 NOTE — Therapy (Signed)
[place] [address] [place], [place], [address] Phone: [phone]   Fax:  [phone]  Patient Details  Name: Dolorez Jeffrey MRN: [id number] Date of Birth: 1970/11/04 Referring Provider:  No ref. provider found  Encounter Date: 03/08/2015  Patient Care Conference:  Therapy Team Members present (names below): PT/PTA: Billie Ruddy, Cameron Sprang OT: Redmond Baseman ST: Garald Balding   Therapy Focus Areas PT: gait training, balance, strengthening    OT: LUE shoulder ROM, strength, pain; coordination, grip strength, ADLS, Neuro re-education     ST: verbal expression, swallowing/precautions    Positive Prognostic Indicators: motivated     Barriers to Progress: poor carryover of HEP's and recommendations, medical complications, decreased family support, inconsistent attendance/transportation issues     Education Provided: CVA warning signs/symptoms, memory strategies, HEP's, swallowing precautions, referrals to neuro-opthamalogy     Anticipated Outcomes: Modified independence with BADLS, sup/min assist for IADLS, household mobility with cane, community mobility with walker     Follow Up Services: ? Voc Rehab services in the future     Other relevant information discussed: Awaiting results from cardiac monitor for A-fib. Plans to d/c O.T. next week, plans to d/c P.T. in 2 weeks            Carey Bullocks, OTR/L 03/08/2015, 12:32 PM  [place] [address] [place], [place], [address] Phone: [phone]   Fax:  [phone]

## 2015-03-09 ENCOUNTER — Ambulatory Visit: Payer: Medicaid Other

## 2015-03-13 ENCOUNTER — Ambulatory Visit: Payer: Medicaid Other | Admitting: Occupational Therapy

## 2015-03-13 ENCOUNTER — Ambulatory Visit: Payer: Medicaid Other

## 2015-03-13 ENCOUNTER — Ambulatory Visit: Payer: Medicaid Other | Admitting: Physical Therapy

## 2015-03-15 ENCOUNTER — Encounter: Payer: Self-pay | Admitting: Occupational Therapy

## 2015-03-15 ENCOUNTER — Ambulatory Visit: Payer: Medicaid Other | Admitting: Occupational Therapy

## 2015-03-15 VITALS — BP 108/56

## 2015-03-15 DIAGNOSIS — I69359 Hemiplegia and hemiparesis following cerebral infarction affecting unspecified side: Secondary | ICD-10-CM

## 2015-03-15 DIAGNOSIS — R269 Unspecified abnormalities of gait and mobility: Secondary | ICD-10-CM | POA: Diagnosis not present

## 2015-03-15 DIAGNOSIS — IMO0002 Reserved for concepts with insufficient information to code with codable children: Secondary | ICD-10-CM

## 2015-03-15 DIAGNOSIS — R29898 Other symptoms and signs involving the musculoskeletal system: Secondary | ICD-10-CM

## 2015-03-15 NOTE — Telephone Encounter (Signed)
Spoke to patient. Gave 2d Echo results. Patient verbalized understanding. She says the best phone numbers to reach her on are the numbers listed on file.

## 2015-03-15 NOTE — Therapy (Signed)
[facility] [address] [place], [place], [address] Phone: [phone]   Fax:  [phone]  Occupational Therapy Treatment  Patient Details  Name: Brandy Roberts MRN: [id number] Date of Birth: [birth year] Referring Provider:  Lance Bosch, NP  Encounter Date: 03/15/2015      OT End of Session - 03/15/15 1406    Visit Number 13   Number of Visits 17   Date for OT Re-Evaluation 02/25/15   Authorization Type [employer]   Authorization Time Period week 8/8   OT Start Time 1320   OT Stop Time 1400   OT Time Calculation (min) 40 min   Activity Tolerance Patient tolerated treatment well      Past Medical History  Diagnosis Date  . Hypertension   . Stroke ([place])   . Hyperlipidemia     Past Surgical History  Procedure Laterality Date  . Cesarean section    . Tonsillectomy    . Ganglion cyst excision    . Tee without cardioversion N/A 10/09/2014    Procedure: TRANSESOPHAGEAL ECHOCARDIOGRAM (TEE);  Surgeon: Sanda Klein, MD;  Location: [facility] ENDOSCOPY;  Service: Cardiovascular;  Laterality: N/A;    Filed Vitals:   03/15/15 1331  BP: 108/56  SpO2: 97%    Visit Diagnosis:  Hemiparesis affecting nondominant side as late effect of cerebrovascular accident ([place])  Left arm weakness  Lack of coordination due to stroke      Subjective Assessment - 03/15/15 1336    Subjective  I have had a headache for the past 2 days. (Therapist assessed vitals - see vitals)   Pertinent History See EPIC snapshot   Limitations **heart monitor   Patient Stated Goals walking better, get mobility back in my arm and leg   Currently in Pain? Yes   Pain Score 7    Pain Location Head   Pain Descriptors / Indicators Headache   Pain Type Acute pain   Pain Onset --  headache for the past 2 days   Pain Frequency Constant   Aggravating Factors  nothing   Pain Relieving Factors meds                      OT  Treatments/Exercises (OP) - 03/15/15 0001    ADLs   ADL Comments Discussed reasons for d/c today including lack of consistent progress (fluctuates), inconsistent attendance d/t transportation issues, as well as questionable medical issues. Also reviewed signs and symptoms of CVA and to call 911 if pt has any symptoms. Pt agreed. Vitals taken today d/t complaint of headache with BP 108/56, HR b/t 57-68, O2 97%. Assessed all ongoing/unmet STG's and LTG's and reviewed with patient  (see goal section for results)                  OT Short Term Goals - 03/15/15 1406    OT SHORT TERM GOAL #1   Title Independent w/ initial HEP for coordination and grip strength (due 01/25/15)   Status Achieved   OT SHORT TERM GOAL #2   Title Improve LUE function as demo by performing 38 blocks or > on Box & Blocks test   Status Not Met  01/24/16 - 35 BLOCKS. 02/15/15 - 33 blocks. 03/15/15 - 36 blocks   OT SHORT TERM GOAL #3   Title Mod I level with hooking bra, donning socks, and tying shoes with A/E and/or compensatory strategies prn   Status Achieved   OT SHORT TERM GOAL #4  Title Improve grip strength Lt hand to 35 lbs to assist in opening containers   Status Achieved  42, 35,33 lbs. However when last assessed 02/13/15 = 30 lbs. 03/15/15 = 40 lbs   OT SHORT TERM GOAL #5   Title Pt independent with HEP for vision (tracking and convergence)   Status Achieved           OT Long Term Goals - 03/15/15 1408    OT LONG TERM GOAL #1   Title Independent w/ updated HEP for LUE strengthening (due 02/25/15)   Time 8   Period Weeks   Status Achieved   OT LONG TERM GOAL #2   Title Place 3 # object on high shelf LUE 5/5 trials w/o rest   Baseline eval = unable, 3/5 on MMT (NO resistance tolerated)   Time 8   Period Weeks   Status Not Met  Can do with 1 lb. weight only   OT LONG TERM GOAL #3   Title Improve coordination as evidenced by performing 9 hole peg test in 28 sec. or less   Baseline eval =  36.75 sec. (Rt = 23.35 sec)   Time 8   Period Weeks   Status Achieved  03/15/15: 28.94 sec.    OT LONG TERM GOAL #4   Title Improve grip strength Lt hand to 45 lbs or greater to assist w/ opening containers/jars   Baseline eval = 20 (Rt = 75 lbs)   Time 8   Period Weeks   Status Not Met  03/15/15: 40 lbs   OT LONG TERM GOAL #5   Title Pt to return to IADL tasks safely Mod I level (cooking/cleaning)   Time 8   Period Weeks   Status Not Met  Pt's son and girlfriend doing all cooking and most cleaning. Pt can make simple snack, sandwich, heat things in microwave   OT LONG TERM GOAL #6   Title Updated cognition goal TBD   Time 8   Period Weeks   Status Deferred  speech therapy referral made to address               Plan - 03/15/15 1410    Clinical Impression Statement Pt met 4/5 STG's and 2/5 LTG's. Pt with inconsistent progress and/or lack of progress and unable to attend therapy consistently d/t transportation issues.    Plan D/C O.T.    OT Home Exercise Plan 01/15/15: Coordination and putty HEP. 01/18/15: Visual HEP. 01/22/15: LUE strengthening HEP and added convergence/divergence to vision HEP; updated HEP for LUE 01/22/15   Consulted and Agree with Plan of Care Patient        Problem List Patient Active Problem List   Diagnosis Date Noted  . Dysphagia, post-stroke 02/20/2015  . Palpitations 01/24/2015  . Essential hypertension 01/24/2015  . Former smoker 01/24/2015  . Cardiomyopathy ([place]) 01/24/2015  . Alteration of sensations, post-stroke 12/22/2014  . Cognitive deficit, post-stroke 11/23/2014  . Hypertension 10/31/2014  . Left hemiparesis ([place]) 10/23/2014  . Cardiomyopathy due to hypertension ([place]) 10/23/2014  . Chronic ischemic vertebrobasilar artery thalamic stroke   . Aortic valve vegetation   . Hypertensive heart disease   . Overweight (BMI 25.0-29.9)   . H/O noncompliance with medical treatment, presenting hazards to health   . Aortic regurgitation    . Tobacco use disorder 10/07/2014  . Polysubstance abuse 10/07/2014  . Cerebral infarction due to thrombosis of right middle cerebral artery ([place])   . Hyperlipidemia   . Embolic stroke  involving right middle cerebral artery ([place]) 10/06/2014    OCCUPATIONAL THERAPY DISCHARGE SUMMARY  Visits from Start of Care: 13  Current functional level related to goals / functional outcomes: SEE ABOVE   Remaining deficits: LUE strength LUE coordination LUE shoulder pain cognition   Education / Equipment: HEP's, CVA education  Plan: Patient agrees to discharge.  Patient goals were partially met. Patient is being discharged due to lack of progress.  ?????       Carey Bullocks, OTR/L 03/15/2015, 2:14 PM  [place] [address] [place], [place], [address] Phone: [phone]   Fax:  [phone]

## 2015-03-16 ENCOUNTER — Ambulatory Visit: Payer: Medicaid Other | Admitting: Rehabilitation

## 2015-03-16 ENCOUNTER — Ambulatory Visit: Payer: Medicaid Other

## 2015-03-20 ENCOUNTER — Encounter: Payer: Medicaid Other | Attending: Physical Medicine & Rehabilitation

## 2015-03-20 ENCOUNTER — Ambulatory Visit (HOSPITAL_BASED_OUTPATIENT_CLINIC_OR_DEPARTMENT_OTHER): Payer: Medicaid Other | Admitting: Physical Medicine & Rehabilitation

## 2015-03-20 ENCOUNTER — Encounter: Payer: Self-pay | Admitting: Physical Medicine & Rehabilitation

## 2015-03-20 VITALS — BP 129/50 | HR 58

## 2015-03-20 DIAGNOSIS — I69898 Other sequelae of other cerebrovascular disease: Secondary | ICD-10-CM | POA: Diagnosis not present

## 2015-03-20 DIAGNOSIS — I69319 Unspecified symptoms and signs involving cognitive functions following cerebral infarction: Secondary | ICD-10-CM

## 2015-03-20 DIAGNOSIS — I10 Essential (primary) hypertension: Secondary | ICD-10-CM | POA: Insufficient documentation

## 2015-03-20 DIAGNOSIS — I63431 Cerebral infarction due to embolism of right posterior cerebral artery: Secondary | ICD-10-CM | POA: Diagnosis not present

## 2015-03-20 DIAGNOSIS — I6931 Attention and concentration deficit following cerebral infarction: Secondary | ICD-10-CM | POA: Insufficient documentation

## 2015-03-20 DIAGNOSIS — I69398 Other sequelae of cerebral infarction: Secondary | ICD-10-CM

## 2015-03-20 DIAGNOSIS — R208 Other disturbances of skin sensation: Secondary | ICD-10-CM

## 2015-03-20 DIAGNOSIS — R270 Ataxia, unspecified: Secondary | ICD-10-CM | POA: Diagnosis not present

## 2015-03-20 DIAGNOSIS — R209 Unspecified disturbances of skin sensation: Principal | ICD-10-CM

## 2015-03-20 NOTE — Progress Notes (Signed)
Subjective:    Patient ID: Brandy Roberts, female    DOB: Apr 02, 1971, 44 y.o.   MRN: [id number] Admitted to [facility] Oct 06, 2014, with decreased balance, left-sided weakness. Blood pressure 196/88. Urine drug screen positive for marijuana. MRI of the brain showed acute lacunar infarct, lateral right thalamus, posterior limb of right external capsule, and also right corona radiata. HPI Patient completed inpatient rotation between 10/10/2014 and 10/18/2014. She has now completed outpatient therapy. She is still not driving. She is still having left lower extremity greater than left upper extremity weakness and poor balance. Using a walker to ambulate as well as a left AFO. She follows up with cardiology as well as primary care physician. Currently has cardiac monitor. Neurology suspects embolic infarct Pain Inventory Average Pain 5 Pain Right Now 5 My pain is aching  In the last 24 hours, has pain interfered with the following? General activity 5 Relation with others 5 Enjoyment of life 4 What TIME of day is your pain at its worst? night Sleep (in general) Fair  Pain is worse with: walking and standing Pain improves with: medication Relief from Meds: 4  Mobility use a walker how many minutes can you walk? 10 ability to climb steps?  yes Do you have any goals in this area?  no  Function disabled: date disabled no answer  Neuro/Psych depression  Prior Studies Any changes since last visit?  no  Physicians involved in your care Any changes since last visit?  no   Family History  Problem Relation Age of Onset  . Cancer Mother   . Hypertension Mother   . Hypertension Sister    Social History   Social History  . Marital Status: Single    Spouse Name: N/A  . Number of Children: N/A  . Years of Education: N/A   Social History Main Topics  . Smoking status: Former Smoker    Quit date: 11/01/2014  . Smokeless tobacco: Never Used  . Alcohol Use: No  . Drug  Use: None  . Sexual Activity: Not Asked   Other Topics Concern  . 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 Cesarean section    . Tonsillectomy    . Ganglion cyst excision    . Tee without cardioversion N/A 10/09/2014    Procedure: TRANSESOPHAGEAL ECHOCARDIOGRAM (TEE);  Surgeon: Sanda Klein, MD;  Location: [facility] ENDOSCOPY;  Service: Cardiovascular;  Laterality: N/A;   Past Medical History  Diagnosis Date  . Hypertension   . Stroke ([place])   . Hyperlipidemia    BP 129/50 mmHg  Pulse 58  SpO2 98%  LMP 12/28/2014 (Within Weeks)  Opioid Risk Score:   Fall Risk Score:  `1  Depression screen PHQ 2/9  Depression screen [facility] 2/9 02/20/2015 11/30/2014 11/23/2014 10/31/2014 10/23/2014  Decreased Interest 3 3 2  0 2  Down, Depressed, Hopeless 3 - 3 0 1  PHQ - 2 Score 6 3 5  0 3  Altered sleeping - 3 2 - 3  Tired, decreased energy - 3 3 - 3  Change in appetite - - 1 - 1  Feeling bad or failure about yourself  - 0 0 - 2  Trouble concentrating - 1 2 - 0  Moving slowly or fidgety/restless - - 0 - 1  Suicidal thoughts - - 0 - 0  PHQ-9 Score - 10 13 - 13  Review of Systems  Respiratory: Positive for shortness of breath.   All other systems reviewed and are negative.      Objective:   Physical Exam  Constitutional: She is oriented to person, place, and time. She appears well-developed and well-nourished.  HENT:  Head: Normocephalic and atraumatic.  Eyes: Conjunctivae and EOM are normal. Pupils are equal, round, and reactive to light.  Neck: Normal range of motion.  Neurological: She is alert and oriented to person, place, and time. She displays no atrophy and no tremor. Coordination abnormal.  Motor strength is 4+ in the left deltoid, biceps, triceps, grip 5/5 in the right deltoid, bicep, tricep, grip Left lower extremity 3+ in the hip flexor and knee extensor and 2 minus at the ankle  dorsiflexor. 5/5 right hip flexor and extensor ankle dorsal flexor plantar flexor Reduced fine motor coordination left upper extremity  Psychiatric: She has a normal mood and affect.  Nursing note and vitals reviewed.  Responses are mildly delayed Ambulates with a walker and left AFO. Short step length wider base of support      Assessment & Plan:  1. Left hemiparesis lower extremity greater than upper extremity related to right Subcortical infarcts She is reaching a plateau at the current time. As discussed with patient she still may make additional improvements with her balance but do not expect any major changes in her strength on the left side.  Do not think any further PT OT or speech are needed at this time we'll monitor for decline in functioning  Would not recommend Return to work. Will return to clinic in 2 months to reassess. She states that her position is still being held for her and she would like to go back if she could.

## 2015-03-21 ENCOUNTER — Ambulatory Visit: Payer: Medicaid Other | Admitting: Physical Therapy

## 2015-03-21 DIAGNOSIS — IMO0002 Reserved for concepts with insufficient information to code with codable children: Secondary | ICD-10-CM

## 2015-03-21 DIAGNOSIS — R269 Unspecified abnormalities of gait and mobility: Secondary | ICD-10-CM | POA: Diagnosis not present

## 2015-03-21 DIAGNOSIS — I69359 Hemiplegia and hemiparesis following cerebral infarction affecting unspecified side: Secondary | ICD-10-CM

## 2015-03-21 NOTE — Therapy (Signed)
[facility] [address] [place], [place], [address] Phone: [phone]   Fax:  [phone]  Physical Therapy Treatment  Patient Details  Name: Brandy Roberts MRN: [id number] Date of Birth: 07-Jun-1970 No Data Recorded  Encounter Date: 03/21/2015      PT End of Session - 03/21/15 2012    Visit Number 13   Number of Visits 19   Date for PT Re-Evaluation 03/24/15   Authorization Type [employer]   PT Start Time 1404   PT Stop Time 1442   PT Time Calculation (min) 38 min   Activity Tolerance Patient tolerated treatment well   Behavior During Therapy [facility] for tasks assessed/performed      Past Medical History  Diagnosis Date  . Hypertension   . Stroke ([place])   . Hyperlipidemia     Past Surgical History  Procedure Laterality Date  . Cesarean section    . Tonsillectomy    . Ganglion cyst excision    . Tee without cardioversion N/A 10/09/2014    Procedure: TRANSESOPHAGEAL ECHOCARDIOGRAM (TEE);  Surgeon: Sanda Klein, MD;  Location: [facility] ENDOSCOPY;  Service: Cardiovascular;  Laterality: N/A;    There were no vitals filed for this visit.  Visit Diagnosis:  Abnormality of gait  Hemiparesis affecting nondominant side as late effect of cerebrovascular accident [facility])  Lack of coordination due to stroke      Subjective Assessment - 03/21/15 1408    Subjective Pt saw Dr. Letta Pate yesterday. Expressing disappointment with MD recommendation for no driving, no working at this time. Reports no falls.   Pertinent History CVA 10/06/14   Limitations Walking;House hold activities   Patient Stated Goals 9/22: "to walk with a cane," and "to go back to work"   Currently in Pain? Yes   Pain Score 4    Pain Location Leg   Pain Orientation Left   Pain Descriptors / Indicators Aching   Pain Type Chronic pain   Pain Onset More than a month ago   Pain Frequency Intermittent   Aggravating Factors  pt unsure   Pain Relieving  Factors meds                         [facility] Adult PT Treatment/Exercise - 03/21/15 0001    Ambulation/Gait   Ambulation/Gait Yes   Ambulation/Gait Assistance 6: Modified independent (Device/Increase time);5: Supervision;4: Min guard;4: Min assist   Ambulation/Gait Assistance Details x80' with RW and mod I; x200' over level, indoor surfaces with SPC requiring supervision to min A due to LOB x2 when tunring head to talk with this PT; x300' (standing rest breaks x2) over unlevel, paved surfaces with supervision to min A.   Ambulation Distance (Feet) 580 Feet  total   Assistive device Rolling walker   Gait Pattern Step-through pattern;Decreased stance time - left;Decreased step length - right;Decreased weight shift to left;Poor foot clearance - left;Decreased hip/knee flexion - left;Decreased dorsiflexion - left;Left foot flat;Narrow base of support;Left hip hike  narrow BOS intermittent during gait trials   Ramp 5: Supervision   Ramp Details (indicate cue type and reason) using SPC   Curb 5: Supervision   Curb Details (indicate cue type and reason) using [facility]                PT Education - 03/21/15 2006    Education provided Yes   Education Details Educated on goals, findings, progress and D/C plan. Recommending pt continue to utilize RW for  all mobility due to gait instability with SPC during today's session.   Person(s) Educated Patient   Methods Explanation   Comprehension Verbalized understanding          PT Short Term Goals - 01/31/15 1129    PT SHORT TERM GOAL #1   Title Pt will perform HEP with family supervision for improved strength, balance, gait.  Target 01/24/15   Baseline Cueing from PT required, as no family present.   Time 4   Period Weeks   Status Partially Met   PT SHORT TERM GOAL #2   Title Pt will perform at least 8 of 10 reps of sit<>stand transfers with minimal to no UE support for improved transfer efficiency and safety.   Baseline 8/24:  required (S)/cueing for initial 2 sit>stands; mod I for subsequent 8 reps   Time 4   Period Weeks   Status Achieved   PT SHORT TERM GOAL #3   Title Pt will improve Berg Balance score to at least 36/56 for decreased fall risk.   Baseline 8/31: Berg score = 42/56   Time 4   Period Weeks   Status Achieved   PT SHORT TERM GOAL #4   Title Pt will improve TUG score to less than or equal to 30 seconds for decreased fall risk.   Baseline Achieved 8/24. TUG = 27.38 (avg of 3 trials) using RW.   Time 4   Period Weeks   Status Achieved   PT SHORT TERM GOAL #5   Title Pt will verbalize understanding of CVA education   Baseline --   Time 4   Period Weeks   Status Not Met           PT Long Term Goals - 03/21/15 1421    PT LONG TERM GOAL #1   Title Pt will verbalize understanding of fall prevention within the home environment.  Modified target date: 03/22/15.   Baseline 10/19: Pt requires >75% cueing for fall prevention strategies.   Time 8   Period Weeks   Status Not Met   PT LONG TERM GOAL #2   Title Pt will improve gait velocity to at least 1.8 ft/sec for decreased risk of falls/improved gait efficiency. Modified target date: 03/22/15.   Baseline 10/19: gait velocity = 1.81 ft/sec with RW   Time 8   Period Weeks   Status Achieved   PT LONG TERM GOAL #3   Title Pt will improve Berg Balance score to at least 41/56 for decreased fall risk.   Baseline 9/19: Berg score 48/56.   Time 8   Period Weeks   Status Achieved   PT LONG TERM GOAL #4   Title Pt will improve TUG score to less than or equal to 20 seconds for decreased fall risk. Modified target date: 03/22/15.   Baseline 10/19: 19.88 seconds with RW   Time 8   Period Weeks   Status Achieved   PT LONG TERM GOAL #5   Title Pt will ambulate household distances, at least 50-100 ft using cane, modified independently, for improved household gait. Modified target date: 03/22/15.   Baseline 10/19: requires supervision to min guard  due to LOB x2 when turning to look at/talk to this PT   Time 8   Period Weeks   Status Not Met   PT LONG TERM GOAL #6   Title Pt will ambulate 300' over unlevel, paved surfaces with mod I using SPC to indicate increased stability/independence with limited community mobility. Target  date: 03/22/15.   Baseline 10/19: requires supervision to min guard for gait x300' with [facility]   Status Not Met   PT LONG TERM GOAL #7   Title Pt will negotiate standard ramp and curb step with mod I using SPC to indicate safety traversing community obstacles. Target date: 03/22/15.   Baseline 10/19: requires supervision   Status Not Met               Plan - 03/21/15 2019    Clinical Impression Statement Pt has met 3 of 5 STG's and 3 of 7 LTG's. Pt will be discharged from outpatient PT at this time due to lack of consistent measureable progress. Progress has been limited by transportation and decreased carryover of HEP/education provided. Recommending use of RW for all household/community mobility due to gait instability with SPC during this session. Pt verbalized understanding and was in agreement with DC plan.   Consulted and Agree with Plan of Care Patient        Problem List Patient Active Problem List   Diagnosis Date Noted  . Dysphagia, post-stroke 02/20/2015  . Palpitations 01/24/2015  . Essential hypertension 01/24/2015  . Former smoker 01/24/2015  . Cardiomyopathy ([place]) 01/24/2015  . Alteration of sensations, post-stroke 12/22/2014  . Cognitive deficit, post-stroke 11/23/2014  . Hypertension 10/31/2014  . Left hemiparesis ([place]) 10/23/2014  . Cardiomyopathy due to hypertension ([place]) 10/23/2014  . Chronic ischemic vertebrobasilar artery thalamic stroke   . Aortic valve vegetation   . Hypertensive heart disease   . Overweight (BMI 25.0-29.9)   . H/O noncompliance with medical treatment, presenting hazards to health   . Aortic regurgitation   . Tobacco use disorder 10/07/2014  .  Polysubstance abuse 10/07/2014  . Cerebral infarction due to thrombosis of right middle cerebral artery ([place])   . Hyperlipidemia   . Embolic stroke involving right middle cerebral artery ([place]) 10/06/2014    PHYSICAL THERAPY DISCHARGE SUMMARY  Visits from Start of Care: 13  Current functional level related to goals / functional outcomes: See above.   Remaining deficits: Impaired coordination; decreased functional endurance/activity tolerance; impaired standing balance; visual impairments; decreased understanding of fall prevention strategies, CVA risk factors, and CVA warning signs; limited stability/independence with functional mobility/ambulation   Education / Equipment: Explanation, demonstration, and paper handout provided to promote carryover of the following: HEP; walking program; fall prevention strategies; CVA risk factors and warning signs Plan: Patient agrees to discharge.  Patient goals were partially met. Patient is being discharged due to lack of progress.  ?????        Billie Ruddy, PT, DPT [facility] [address] [place], [place], [address] Phone: [phone]   Fax:  [phone] 03/21/2015, 8:22 PM  Name: Brandy Roberts MRN: [id number] Date of Birth: 1971-01-11

## 2015-03-26 ENCOUNTER — Encounter: Payer: Self-pay | Admitting: Cardiology

## 2015-03-26 NOTE — Progress Notes (Signed)
Patient ID: Brandy Roberts, female   DOB: 01-17-1971, 44 y.o.   MRN: [id number]   Brandy Roberts    Date of visit:  03/26/2015 DOB:  Mar 01, 1971    Age:  44 yrs. Medical record number:  [id number]     Account number:  [id number] Primary Care Provider: [facility] A ____________________________ CURRENT DIAGNOSES  1. Hypertensive heart disease without heart failure  2. Chronic combined systolic (congestive) and diastolic (congestive) heart failure  3. Aortic valve insufficiency  4. Other Cerebral Infarction ____________________________ ALLERGIES  No Known Allergies ____________________________ MEDICATIONS  1. atorvastatin 10 mg tablet, 1 p.o. daily  2. clopidogrel 75 mg tablet, 1 p.o. daily  3. hydralazine 50 mg tablet, TID  4. hydrochlorothiazide 25 mg tablet, 1 p.o. daily  5. metoprolol tartrate 50 mg tablet, BID  6. pantoprazole DR 40 mg granules delayed-release for susp in packet, 1 p.o. daily  7. spironolactone 25 mg tablet, 1 p.o. daily  8. Ultram 50 mg tablet, PRN  9. losartan 100 mg tablet, 1 p.o. daily  10. amlodipine 5 mg tablet, Daily ____________________________ HISTORY OF PRESENT ILLNESS Patient seen for cardiac followup. She is still making a slow recovery from the stroke and has mild dysphasia as well as weakness. She still has to walk with a walker. She has been placed on disability now. She saw the neurologist who asked her to have an event monitor put on and she also had an echocardiogram done on September 26 that showed an ejection fraction between 70 and 75% now. Blood pressure is considerably lower than it had been. It was only 419 systolic in the office today. ____________________________ PAST HISTORY  Past Medical Illnesses:  hypertension, lacunar infarcts with left hemiparesis and dysarthria 5/16;  Cardiovascular Illnesses:  aortic regurgitation, CHF;  Surgical Procedures:  cesarean section, tonsillectomy, ganglion cyst;  NYHA Classification:  II;  [place] Angina  Classification:  Class 0: Asymptomatic;  Cardiology Procedures-Invasive:  no history of prior cardiac procedures;  Cardiology Procedures-Noninvasive:  TEE May 2016, echocardiogram May 2016, event monitor;  LVEF of 40% documented via echocardiogram on 10/09/2014,   ____________________________ CARDIO-PULMONARY TEST DATES EKG Date:  12/25/2014;  Echocardiography Date: 10/09/2014;   ____________________________ FAMILY HISTORY Father -- Father dead, Death of unknown cause Mother -- Mother dead, Malignant neoplasm of liver Sister -- Sister alive with problem, Hypertension Sister -- Sister alive and well Sister -- Sister alive and well ____________________________ SOCIAL HISTORY Alcohol Use:  no alcohol use;  Smoking:  used to smoke but quit 2016;  Diet:  regular diet;  Lifestyle:  single;  Exercise:  no regular exercise;  Occupation:  disabled;  Residence:  lives with son;   ____________________________ REVIEW OF SYSTEMS General:  obesity Eyes: wears eye glasses/contact lenses Respiratory: denies dyspnea, cough, wheezing or hemoptysis. Cardiovascular:  please review HPI Genitourinary-Female: urgency Musculoskeletal:  denies arthritis, venous insufficiency, or muscle weakness Neurological:  denies headaches, stroke, or TIA Psychiatric:  denies depression or anxiety Hematological/Immunologic:  denies any food allergies, bleeding disorders. ____________________________ PHYSICAL EXAMINATION VITAL SIGNS  Blood Pressure:  98/60 Sitting, Left arm, regular cuff  , 100/66 Standing, Left arm and regular cuff   Pulse:  76/min. Weight:  177.00 lbs. Height:  66"BMI: 28  Constitutional:  pleasant African American female in no acute distress, mildly obese walks with walker Skin:  warm and dry to touch, no apparent skin lesions, or masses noted. Head:  normocephalic, normal hair pattern, no masses or tenderness ENT:  ears, nose and throat reveal no gross abnormalities.  Dentition good. Neck:  supple, without  massess. No JVD, thyromegaly or carotid bruits. Carotid upstroke normal. Chest:  normal symmetry, clear to auscultation. Cardiac:  regular rhythm, normal S1 and S2, No S3 or S4, no murmurs, gallops or rubs detected. Peripheral Pulses:  the femoral,dorsalis pedis, and posterior tibial pulses are full and equal bilaterally with no bruits auscultated. Extremities & Back:  no deformities, clubbing, cyanosis, erythema or edema observed. Normal muscle strength and tone. Neurological:  mild dysarthria, mild left sided weakness ____________________________ IMPRESSIONS/PLAN  1. Hypertensive heart disease 2. Chronic systolic heart disease with resolution of abnormal ventricular function likely due to better control of blood pressure 3. Recent stroke with residual  Recommendations:  I had originally set her up for another echo but will cancel that now but I know that she has had one done recently. Continue stroke rehabilitation. Followup in 2 months. Reduce amlodipine 5 mg daily. ____________________________ TODAYS ORDERS  1. Return Visit: 2 months                       ____________________________ Cardiology Physician:  Kerry Hough MD [facility]

## 2015-03-27 ENCOUNTER — Other Ambulatory Visit: Payer: Self-pay | Admitting: Internal Medicine

## 2015-03-27 DIAGNOSIS — I10 Essential (primary) hypertension: Secondary | ICD-10-CM

## 2015-03-27 MED ORDER — AMLODIPINE BESYLATE 5 MG PO TABS: 5.0000 mg | ORAL_TABLET | Freq: Every day | ORAL | Status: DC

## 2015-04-04 ENCOUNTER — Telehealth: Payer: Self-pay

## 2015-04-04 NOTE — Telephone Encounter (Signed)
LFt vm for patient to call back about 30 day event cardiac monitoring.

## 2015-04-05 ENCOUNTER — Telehealth: Payer: Self-pay | Admitting: Neurology

## 2015-04-05 ENCOUNTER — Telehealth: Payer: Self-pay | Admitting: Internal Medicine

## 2015-04-05 NOTE — Telephone Encounter (Signed)
Rn spoke to patient about her 30 day cardiac event monitoring device. Rn explain that per Dr. Erlinda Hong her cardiac monitoring showed no atrial fib and to continue treatment plan. Pt verbalized understanding. See Md notes below:    Hi, Brandy Roberts:         Could you please let the pt know that the 30 day cardiac event monitoring did not show any afib episodes. Continue current management. Thanks.         Rosalin Hawking, MD PhD    Stroke Neurology    04/03/2015    5:15 PM

## 2015-04-05 NOTE — Telephone Encounter (Signed)
    RN stated that her 2 day ecno was normal, and to continue treatment plan. Pt verbalized understanding. Per pts chart she was call 03-15-2015 with the echo notes by a previous staff person at [facility].         Notes Recorded by Rosalin Hawking, MD on 02/27/2015 at 1:58 PM Hello, could you please let her know that her 2D echo did the other day was unremarkable. Continue current treatment plan. Thanks.  Rosalin Hawking, MD PhD Stroke Neurology 02/27/2015 1:58 PM

## 2015-04-05 NOTE — Telephone Encounter (Signed)
Patient came into facility to request a medication for her always being cold. Please f/u

## 2015-04-05 NOTE — Telephone Encounter (Signed)
Brandy Roberts talked with pt on this operators phone. RN will need to document

## 2015-04-05 NOTE — Telephone Encounter (Signed)
LFt 2nd vm for patient concerning her cardiac event monitoring

## 2015-04-05 NOTE — Telephone Encounter (Signed)
Pt called and would like to know results of Echo. Please call and advise [phone]

## 2015-04-06 ENCOUNTER — Ambulatory Visit: Payer: Medicaid Other | Attending: Physical Medicine & Rehabilitation

## 2015-04-06 DIAGNOSIS — R41841 Cognitive communication deficit: Secondary | ICD-10-CM | POA: Insufficient documentation

## 2015-04-06 DIAGNOSIS — R1314 Dysphagia, pharyngoesophageal phase: Secondary | ICD-10-CM | POA: Diagnosis not present

## 2015-04-06 NOTE — Therapy (Signed)
[facility] [address] [place], [place], [address] Phone: [phone]   Fax:  [phone]  Speech Language Pathology Treatment  Patient Details  Name: Brandy Roberts MRN: [id number] Date of Birth: 04-08-1971 No Data Recorded  Encounter Date: 04/06/2015      End of Session - 04/06/15 1424    Visit Number 4   Number of Visits 16   Date for SLP Re-Evaluation 04/09/15   Authorization Type medicaid   Authorization - Visit Number 4   Authorization - Number of Visits 16   SLP Start Time 0626   SLP Stop Time  1445   SLP Time Calculation (min) 43 min   Activity Tolerance Patient tolerated treatment well      Past Medical History  Diagnosis Date  . Hypertension   . Stroke ([place])   . Hyperlipidemia     Past Surgical History  Procedure Laterality Date  . Cesarean section    . Tonsillectomy    . Ganglion cyst excision    . Tee without cardioversion N/A 10/09/2014    Procedure: TRANSESOPHAGEAL ECHOCARDIOGRAM (TEE);  Surgeon: Sanda Klein, MD;  Location: [facility] ENDOSCOPY;  Service: Cardiovascular;  Laterality: N/A;    There were no vitals filed for this visit.  Visit Diagnosis: Dysphagia, pharyngoesophageal phase  Cognitive communication deficit      Subjective Assessment - 04/06/15 1412    Subjective Pt has not been performing HEP as directed. "It's in my house somewhere" (re: HEP)               ADULT SLP TREATMENT - 04/06/15 1413    General Information   Behavior/Cognition Alert;Pleasant mood;Cooperative   Treatment Provided   Treatment provided Cognitive-Linquistic   Dysphagia Treatment   Temperature Spikes Noted --   Respiratory Status --   Other treatment/comments Pt states "I still get gagged up" when eating POs. SLP reviewed swallow precautions with pt, and she stated she was still experiencing heartburn. SLP provided her handout on reflux/heartburn.    Pain Assessment   Pain Assessment  0-10   Pain Score 5    Pain Location lt leg   Pain Descriptors / Indicators Numbness;Tingling   Pain Intervention(s) Monitored during session   Cognitive-Linquistic Treatment   Treatment focused on Cognition   Skilled Treatment Pt told SLP that she has left things cooking on the stove and not remembered as such. SLP use stove only when someone else in the home, telling them she's using the stove ifrst. SLP also told pt leave the stove ONLY when absolutely necessary but turn stove off first, and use microwave to cook if pt is home alone..   Assessment / Recommendations / Plan   Plan Continue with current plan of care  pending more visits/date extension from CCME   Dysphagia Recommendations   Diet recommendations Regular   Liquids provided via Cup   Compensations Small sips/bites;Effortful swallow          SLP Education - 04/06/15 1450    Education provided Yes   Education Details ways to decr reflux, swallow precautions (small sips/bites) crush meds if possible   Person(s) Educated Patient   Methods Explanation   Comprehension Verbalized understanding;Need further instruction          SLP Short Term Goals - 04/06/15 1655    SLP SHORT TERM GOAL #1   Title pt will demo slowed rate in 15/20 sentences with rare min A   Baseline 0/5   Time 4  Period Weeks   Status Deferred  apraxia appears to have resolved   SLP SHORT TERM GOAL #2   Title pt will demo HEP for apraxia/motor planning with occasinal min A   Baseline not provided yet   Time 4   Period Weeks   Status Deferred  apraxia appears to have resolved   SLP SHORT TERM GOAL #3   Title pt will demo compensations adequate for 90% WNL speech in 3 mintues simple conversation with nonverbal cues   Baseline none provided today   Time 4   Period Weeks   Status Achieved   SLP SHORT TERM GOAL #4   Title pt will perform HEP for swallowing with rare min A   Baseline mod A   Time 4   Period Weeks   Status On-going           SLP Long Term Goals - 04/06/15 1656    SLP LONG TERM GOAL #1   Title pt will demo [facility] speech in 10 minutes mod complex conversation using modified independence (compensations)   Baseline compensations not provided yet   Time 7   Period Weeks  or 16 visits   Status Deferred  apraxia appears to have resolved   SLP LONG TERM GOAL #2   Title pt will demo HEP for motor planning/apraxia with rare min A   Baseline not provided yet   Time 7   Period Weeks  or 16 visits   Status Deferred  apraxia appears to have resolved   SLP LONG TERM GOAL #3   Title pt will demo simple WFL conversation for 7 minutes with mod ified independence (compensations)   Baseline 0 minutes   Time 7   Period Weeks  or 16 visits   Status Achieved   SLP LONG TERM GOAL #4   Title HEP for swallowing with indpendence   Baseline mod A   Time 7   Period Weeks   Status On-going          Plan - 04/06/15 1450    Clinical Impression Statement  Apraxia appears to have improved/resolved. Pt's modified barium swallow appeared WNL except for mild oral difficulties and pill dysphagia, however pt reports cont cough, possibly due to esophageal issues(?). Pt reports leaving stove on while cooking. Cont skilled ST to address cognition-linguistics, assessing for cognitive -linguistic deficits and goals developed appropriately next session. Awaiting visit authorization from [facility] as pt does not want to go "self pay" status.   Speech Therapy Frequency 2x / week   Duration --  6 weeks or 12 viisits, pending Medicaid approval   Treatment/Interventions Internal/external aids;Compensatory strategies;SLP instruction and feedback;Patient/family education;Functional tasks;Cueing hierarchy   Potential to Achieve Goals Fair   Potential Considerations Co-morbidities;Severity of impairments;Family/community support   Consulted and Agree with Plan of Care Patient        Problem List Patient Active Problem List   Diagnosis  Date Noted  . Dysphagia, post-stroke 02/20/2015  . Former smoker 01/24/2015  . Alteration of sensations, post-stroke 12/22/2014  . Cognitive deficit, post-stroke 11/23/2014  . Left hemiparesis ([place]) 10/23/2014  . Cardiomyopathy due to hypertension ([place]) 10/23/2014  . Chronic ischemic vertebrobasilar artery thalamic stroke   . Aortic valve vegetation   . Hypertensive heart disease   . Overweight (BMI 25.0-29.9)   . H/O noncompliance with medical treatment, presenting hazards to health   . Aortic regurgitation   . Cerebral infarction due to thrombosis of right middle cerebral artery ([place])   . Hyperlipidemia   .  Embolic stroke involving right middle cerebral artery ([place]) 10/06/2014    [facility] , [place], [place]  04/06/2015, 4:59 PM  [facility] [address] [place], [place], [address] Phone: [phone]   Fax:  [phone]   Name: Sharalyn Lomba MRN: [id number] Date of Birth: 18-Nov-1970

## 2015-04-06 NOTE — Patient Instructions (Signed)
   Look at that reflux handout I gave you. You could be coughing because of that.   Your swallow test looked good. The only little difficulty you had was with your pills.  Make sure to take small bites and sips.  We will call you in the next two weeks IF MEDICAID DOES GIVE [place] MORE VISITS.

## 2015-04-09 ENCOUNTER — Telehealth: Payer: Self-pay

## 2015-04-09 NOTE — Telephone Encounter (Signed)
Returned patient phone call Patient not available Left message on voice mail to return our call 

## 2015-05-01 ENCOUNTER — Ambulatory Visit (INDEPENDENT_AMBULATORY_CARE_PROVIDER_SITE_OTHER): Payer: Medicaid Other | Admitting: Neurology

## 2015-05-01 ENCOUNTER — Telehealth: Payer: Self-pay

## 2015-05-01 ENCOUNTER — Encounter: Payer: Self-pay | Admitting: Neurology

## 2015-05-01 VITALS — BP 116/74 | HR 70 | Ht 66.0 in | Wt 186.5 lb

## 2015-05-01 DIAGNOSIS — F17200 Nicotine dependence, unspecified, uncomplicated: Secondary | ICD-10-CM

## 2015-05-01 DIAGNOSIS — R519 Headache, unspecified: Secondary | ICD-10-CM | POA: Insufficient documentation

## 2015-05-01 DIAGNOSIS — I63311 Cerebral infarction due to thrombosis of right middle cerebral artery: Secondary | ICD-10-CM | POA: Diagnosis not present

## 2015-05-01 DIAGNOSIS — R0683 Snoring: Secondary | ICD-10-CM

## 2015-05-01 DIAGNOSIS — I10 Essential (primary) hypertension: Secondary | ICD-10-CM | POA: Diagnosis not present

## 2015-05-01 DIAGNOSIS — R51 Headache: Secondary | ICD-10-CM

## 2015-05-01 DIAGNOSIS — G441 Vascular headache, not elsewhere classified: Secondary | ICD-10-CM | POA: Diagnosis not present

## 2015-05-01 HISTORY — DX: Headache, unspecified: R51.9

## 2015-05-01 MED ORDER — BUTALBITAL-APAP-CAFFEINE 50-325-40 MG PO TABS: 1.0000 | ORAL_TABLET | Freq: Four times a day (QID) | ORAL | Status: DC | PRN

## 2015-05-01 NOTE — Telephone Encounter (Signed)
Rn call patient to notify that her 30 day cardiac event monitoring did not show any atrial fibrillation, and to continue management. Pt verbalized understanding and stated she will be at the appt today with Dr. Erlinda Hong at 0130pm today.

## 2015-05-01 NOTE — Telephone Encounter (Signed)
-----   Message from Rosalin Hawking, MD sent at 04/25/2015  3:59 PM EST ----- Could you please let the pt know that her 30 day cardiac event monitoring did not show afib. Continue current management. Thanks.  Rosalin Hawking, MD PhD Stroke Neurology 04/25/2015 3:59 PM

## 2015-05-01 NOTE — Patient Instructions (Addendum)
-   continue plavix and lipitor for stroke prevention - quit smoking completely - check BP at home and record and compliance with medication - Follow up with your primary care physician for stroke risk factor modification. Recommend maintain blood pressure goal <130/80, diabetes with hemoglobin A1c goal below 6.5% and lipids with LDL cholesterol goal below 70 mg/dL.  - will do TCD emboli detection - continue PT/OT and follow up with Dr. Read Drivers - will do sleep study - discuss with Dr. Oran Rein next visit about the need for repeat TEE - fioricet for HA spells, but no more than 2 a day and no more than 5 a week. Take it as early as possible at HA onset. - follow up in 3 months.

## 2015-05-01 NOTE — Progress Notes (Signed)
STROKE NEUROLOGY FOLLOW UP NOTE  NAME: Brandy Roberts DOB: [birth year]  REASON FOR VISIT: stroke follow up HISTORY FROM: pt and chart  Today we had the pleasure of seeing Brandy Roberts in follow-up at our Neurology Clinic. Pt was accompanied by no one.   History Summary Brandy Roberts is a 44 y.o. female with history of HTN not on meds and current smoker was admitted on 10/06/14 for left-sided hemiparesis. MRI showed right thalamus and right corona radiata 2 separate infarcts. CTA head and neck showed left cavernous ICA high grade stenosis. Embolic work up including LE venous doppler and TTE and TEE showed no DVT, EF 40-45% likely due to uncontrolled BP, but AR has a tiny irregularity and can not r/o endocarditis. Cardiology consulted and Dr. Wynonia Lawman did not think that was concerning for endocarditis with no clinical symptoms and negative blood cultures, but he did mention the AR needs to be close monitored. Her BP still high during admission, and renal A ultrasound ruled out RA stenosis. Malignant HTN serology all negative. She was discharged with dural antiplatelet.   01/24/15 follow up - the patient has been doing well. Still has mild left hemiparesis, undergoing PT/OT 2 per week and following up with Dr. Read Drivers. Her BP is better controlled and today is 127/63. She is currently on 6 BP meds. She has quit smoking.   Interval History During the interval time, the patient has been doing the same. BP in good control with 6 BP meds and stated compliance. Complains of episodic headache since stroke, whole head hurts, 10/10, more prominent on the top, with photophobia and phonophobia, but no N/V, associated with blurry vision, double vision, could last all day long. Last episode about 2 weeks ago. Can not take tylenol as per pt. Has not tried fioricet yet. She still smokes.   REVIEW OF SYSTEMS: Full 14 system review of systems performed and notable only for those listed below and in HPI  above, all others are negative:  Constitutional:   Cardiovascular:  Ear/Nose/Throat:   Skin:  Eyes:  Blurry vision, double vision Respiratory:   Gastroitestinal:   Genitourinary:  Hematology/Lymphatic:   Endocrine:  Musculoskeletal:   Allergy/Immunology:   Neurological:   Psychiatric:  Sleep:   The following represents the patient's updated allergies and side effects list: No Known Allergies  The neurologically relevant items on the patient's problem list were reviewed on today's visit.  Neurologic Examination  A problem focused neurological exam (12 or more points of the single system neurologic examination, vital signs counts as 1 point, cranial nerves count for 8 points) was performed.  Blood pressure 116/74, pulse 70, height 5\' 6"  (1.676 m), weight 186 lb 8 oz (84.596 kg).  General - Well nourished, well developed, in no apparent distress.  Ophthalmologic - Sharp disc margins OU.  Cardiovascular - Regular rate and rhythm with no murmur.  Mental Status -  Level of arousal and orientation to time, place, and person were intact. Language including expression, naming, repetition, comprehension was assessed and found intact. Fund of Knowledge was assessed and was intact.  Cranial Nerves II - XII - II - Visual field intact OU. III, IV, VI - Extraocular movements intact. V - Facial sensation decreased on the left, 50% to the right. VII - Facial movement intact bilaterally. VIII - Hearing & vestibular intact bilaterally. X - Palate elevates symmetrically. XI - Chin turning & shoulder shrug intact bilaterally. XII - Tongue protrusion intact.  Motor Strength - The patient's  strength was 5/5 RUE and RLE, 4/5 LUE and LLE but with significant give away weakness on testing. in all extremities and pronator drift was absent.  Bulk was normal and fasciculations were absent.   Motor Tone - Muscle tone was assessed at the neck and appendages and was normal.  Reflexes - The  patient's reflexes were 1+ in all extremities and she had no pathological reflexes.  Sensory - Light touch, temperature/pinprick were assessed and were decreased on the left, 50% to the right.    Coordination - The patient had normal movements in the hands and feet with no ataxia or dysmetria.  Tremor was absent.  Gait and Station - walk with walker with mild left hemiparetic gait.  Data reviewed: I personally reviewed the images and agree with the radiology interpretations.  Ct Angio Head and neck W/cm &/or Wo Cm  10/07/2014 IMPRESSION: 1. No acute arterial findings. 2. No evidence of infarct progression since yesterday. No hemorrhagic conversion. 3. Diffuse bilateral petrous and cavernous carotid stenosis, likely atherosclerotic. Narrowing is up to moderate at the left petrous cavernous junction. 4. Mild beading of the bilateral V3 segments, question fibromuscular dysplasia. 5. 2 mm outpouching from the right supraclinoid carotid, aneurysm or infundibulum with parent vessel below the resolution of CTA. 6. No arterial stenosis in the neck.   Ct Head Wo Contrast  10/06/2014 IMPRESSION: No acute intracranial abnormality. No definite acute cortical infarction.   Mr Brain Wo Contrast  10/06/2014 IMPRESSION: 1. Small acute lacunar infarcts in the lateral right thalamus/posterior limb right external capsule (right PCA territory) and also the right corona radiata (right MCA territory). No mass effect or hemorrhage. 2. Superimposed chronic lacunar infarcts in the left corona radiata and left basal ganglia (left MCA territory). 3. In light of the above in the patient's age, consider a source of cardiac or paradoxical emboli, such as PFO. And otherwise consider risk factors for accelerated small vessel disease.   2D Echocardiogram - Mild LV chamber dilatation with severe LVH, LVEF 40-45%. Grade 1diastolic dysfunction with increased filling pressures. Considerhypertrophic cardiomyopathy or  severe hypertensive heart disease. Mild to moderate left atrial enlargement. Moderate aortic regurgitation. Trivial tricuspid regurgitation, unable to assessPASP. Cannot exclude PFO - possible small left to right atrialshunt noted on some views. Trivial pericardial effusion.  2D echo 02/26/15 - - Left ventricle: The cavity size was normal. There was moderate concentric hypertrophy. Systolic function was hyperdynamic. The estimated ejection fraction was in the range of 70% to 75%. Wall motion was normal; there were no regional wall motion abnormalities. Doppler parameters are consistent with abnormal left ventricular relaxation (grade 1 diastolic dysfunction). - Aortic valve: There was moderate regurgitation. Valve area (VTI): 1.75 cm^2. Valve area (Vmean): 1.96 cm^2.  LE venous doppler There is no DVT or SVT noted in the bilateral lower extremities.   Renal artery [place] - No evidence of renal artery stenosis noted bilaterally. - Mildly increased intrarenal resistive indices bilaterally. Etiology unknown.  TEE Hypertrophic left ventricle with mildly decreased LVEF 40-45% due to global hypokinesis. Trileaflet aortic valve with a tiny irregularity, cannot exclude a small vegetation. Moderate-to-severe aortic insufficiency with a commisural jet that has a highly eccentric direction towards the mitral valve.  30 day cardiac event monitoring - negative for afib  Component     Latest Ref Rng 10/07/2014  Cholesterol     0 - 200 mg/dL 179  Triglycerides     <150 mg/dL 105  HDL Cholesterol     >40 mg/dL 45  Total CHOL/HDL Ratio      4.0  VLDL     0 - 40 mg/dL 21  LDL (calc)     0 - 99 mg/dL 113 (H)  Norepinephrine      100  Epinephrine      25  Dopamine      REPORT  Total Catecholamines (Nor+Epi)      125  PRA LC/MS/MS      0.20  ALDO / PRA Ratio     0.0 - 30.0 30.0  ALDOSTERONE     0.0 - 30.0 ng/dL 6.0  Hemoglobin A1C     4.8 - 5.6 % 5.8 (H)  Mean Plasma  Glucose      120    Assessment: As you may recall, she is a 45 y.o. African American female with PMH of uncontrolled HTN and smoker was admitted on 10/06/14 for right thalamus and right corona radiata 2 separate infarcts, concerning for embolic source vs. Synchronized small vessel events. CTA head and neck showed left cavernous ICA high grade stenosis. Embolic work up including LE venous doppler and TTE and TEE showed no DVT, EF 40-45% likely due to uncontrolled BP, and AV regurg and tiny irregularity needs to be close monitored. Renal A ultrasound ruled out RA stenosis and malignant HTN serology all negative. She was discharged with dural antiplatelet for 3 months. During the interval time, BP controlled well on 6 BP meds and left sided weakness improved. Repeat 2D echo as recommended by cardiology was unremarkable. 30 day cardiac monitoring no afib. Pending TCD MES and sleep study.   Plan:  - continue plavix and lipitor for stroke prevention - quit smoking completely - check BP at home and record and compliance with medication - Follow up with your primary care physician for stroke risk factor modification. Recommend maintain blood pressure goal <130/80, diabetes with hemoglobin A1c goal below 6.5% and lipids with LDL cholesterol goal below 70 mg/dL.  - will do TCD emboli detection - continue PT/OT and follow up with Dr. Read Drivers - will do sleep study - discuss with Dr. Oran Rein about the need for repeat TEE - fioricet for HA spells, but no more than 2 a day and no more than 5 a week. Take it as early as possible at HA onset. - follow up in 3 months.   I spent more than 25 minutes of face to face time with the patient. Greater than 50% of time was spent in counseling and coordination of care.    Orders Placed This Encounter  Procedures  . Ambulatory referral to Sleep Studies    Referral Priority:  Routine    Referral Type:  Consultation    Referral Reason:  Specialty Services Required     Number of Visits Requested:  1    Meds ordered this encounter  Medications  . butalbital-acetaminophen-caffeine (FIORICET, ESGIC) 50-325-40 MG tablet    Sig: Take 1 tablet by mouth every 6 (six) hours as needed for headache.    Dispense:  15 tablet    Refill:  3    Patient Instructions  - continue plavix and lipitor for stroke prevention - quit smoking completely - check BP at home and record and compliance with medication - Follow up with your primary care physician for stroke risk factor modification. Recommend maintain blood pressure goal <130/80, diabetes with hemoglobin A1c goal below 6.5% and lipids with LDL cholesterol goal below 70 mg/dL.  - will do TCD emboli detection - continue PT/OT and  follow up with Dr. Read Drivers - will do sleep study - discuss with Dr. Oran Rein next visit about the need for repeat TEE - fioricet for HA spells, but no more than 2 a day and no more than 5 a week. Take it as early as possible at HA onset. - follow up in 3 months.     Rosalin Hawking, MD PhD [facility] [address] [place], [place] [address] [phone]

## 2015-05-02 DIAGNOSIS — R0683 Snoring: Secondary | ICD-10-CM

## 2015-05-02 DIAGNOSIS — I10 Essential (primary) hypertension: Secondary | ICD-10-CM | POA: Insufficient documentation

## 2015-05-02 HISTORY — DX: Snoring: R06.83

## 2015-05-02 HISTORY — DX: Essential (primary) hypertension: I10

## 2015-05-02 NOTE — Therapy (Signed)
[facility] [address] [place], [place], [address] Phone: [phone]   Fax:  [phone]  Patient Details  Name: Brandy Roberts MRN: [id number] Date of Birth: November 17, 1970 Referring Provider:  No ref. provider found  Encounter Date: 05/02/2015  SPEECH THERAPY DISCHARGE SUMMARY  Visits from Start of Care:   Four, in two months time   Current functional level related to goals / functional outcomes:  Below are the goals from her last therapy session on 04-06-15.  SLP Short Term Goals - 04/06/15 1655     SLP SHORT TERM GOAL #1     Title  pt will demo slowed rate in 15/20 sentences with rare min A     Baseline  0/5     Time  4     Period  Weeks     Status  Deferred  apraxia appears to have resolved     SLP SHORT TERM GOAL #2     Title  pt will demo HEP for apraxia/motor planning with occasinal min A     Baseline  not provided yet     Time  4     Period  Weeks     Status  Deferred  apraxia appears to have resolved     SLP SHORT TERM GOAL #3     Title  pt will demo compensations adequate for 90% WNL speech in 3 mintues simple conversation with nonverbal cues     Baseline  none provided today     Time  4     Period  Weeks     Status  Achieved     SLP SHORT TERM GOAL #4     Title  pt will perform HEP for swallowing with rare min A     Baseline  mod A     Time  4     Period  Weeks     Status  On-going                SLP Long Term Goals - 04/06/15 1656     SLP LONG TERM GOAL #1     Title  pt will demo [facility] speech in 10 minutes mod complex conversation using modified independence (compensations)     Baseline  compensations not provided yet     Time  7     Period  Weeks  or 16 visits     Status  Deferred  apraxia appears to have resolved     SLP LONG TERM GOAL #2     Title  pt will demo HEP for motor planning/apraxia with rare min A     Baseline  not provided yet     Time  7     Period  Weeks  or 16  visits     Status  Deferred  apraxia appears to have resolved     SLP LONG TERM GOAL #3     Title  pt will demo simple WFL conversation for 7 minutes with mod ified independence (compensations)     Baseline  0 minutes     Time  7     Period  Weeks  or 16 visits     Status  Achieved     SLP LONG TERM GOAL #4     Title  HEP for swallowing with indpendence     Baseline  mod A     Time  7     Period  Weeks  Status  On-going    Overall, pt's apraxia resolved over the two months she was seen for four sessions of speech therapy. Pt was scheduled at a frequency greater than what she attended. She obtained a modified barium swallow exam during the course of therapy which revealed pill dysphagia - see that report for further detail and rec swallowing precautions for her during mealtimes. She was educated on swallowing precautions and practiced these during initial sessions. Pt dropped by clinic to schedule appointments for ST on 05-01-15 but told receptionist she was going out of town for two weeks and did not want to schedule further ST appointments. In what would be patient's last session on 04-06-15 she told SLP she had recently left stove on at home. SLP and pt discussed compensations for decr'd attention skills and how to perform cooking tasks at home safely. SLP strongly encouraged pt to use her microwave whenever possible and if she had to use the stove to use a reminder or tell another person in the home to observe pt or to check on her during the process to ensure safety.   Remaining deficits: Assuming dysphagia and cognitive-linguistic deficits remain.   Education / Equipment: Swallow precautions  Plan: Patient agrees to discharge.  Patient goals were partially met. Patient is being discharged due to the patient's request.  ?????and not returning since last visit.      [facility] , [place], [place]  05/02/2015, 5:04 PM  [facility] [address] [place], [place], [address] Phone: [phone]   Fax:  [phone].

## 2015-05-17 ENCOUNTER — Ambulatory Visit: Payer: Medicaid Other | Admitting: Physical Medicine & Rehabilitation

## 2015-06-05 ENCOUNTER — Ambulatory Visit (INDEPENDENT_AMBULATORY_CARE_PROVIDER_SITE_OTHER): Payer: Medicaid Other | Admitting: Neurology

## 2015-06-05 ENCOUNTER — Encounter (INDEPENDENT_AMBULATORY_CARE_PROVIDER_SITE_OTHER): Payer: Self-pay

## 2015-06-05 ENCOUNTER — Telehealth: Payer: Self-pay

## 2015-06-05 ENCOUNTER — Encounter: Payer: Self-pay | Admitting: Neurology

## 2015-06-05 VITALS — BP 114/68 | HR 70 | Resp 16 | Ht 66.0 in | Wt 183.0 lb

## 2015-06-05 DIAGNOSIS — E669 Obesity, unspecified: Secondary | ICD-10-CM | POA: Diagnosis not present

## 2015-06-05 DIAGNOSIS — R51 Headache: Secondary | ICD-10-CM | POA: Diagnosis not present

## 2015-06-05 DIAGNOSIS — G4719 Other hypersomnia: Secondary | ICD-10-CM

## 2015-06-05 DIAGNOSIS — R0683 Snoring: Secondary | ICD-10-CM | POA: Diagnosis not present

## 2015-06-05 DIAGNOSIS — F17200 Nicotine dependence, unspecified, uncomplicated: Secondary | ICD-10-CM | POA: Diagnosis not present

## 2015-06-05 DIAGNOSIS — I63311 Cerebral infarction due to thrombosis of right middle cerebral artery: Secondary | ICD-10-CM | POA: Diagnosis not present

## 2015-06-05 DIAGNOSIS — R519 Headache, unspecified: Secondary | ICD-10-CM

## 2015-06-05 DIAGNOSIS — Z7722 Contact with and (suspected) exposure to environmental tobacco smoke (acute) (chronic): Secondary | ICD-10-CM

## 2015-06-05 MED FILL — BUTALB-ACETAMIN-CAFF 50-325: 50-325-40 | 4 days supply | Qty: 15 | Fill #1

## 2015-06-05 MED FILL — GABAPENTIN 300 MG CAPSULE: 300 | 30 days supply | Qty: 30 | Fill #1

## 2015-06-05 MED FILL — CLOPIDOGREL 75 MG TABLET: 75 | 30 days supply | Qty: 30 | Fill #0

## 2015-06-05 MED FILL — hydrALAZINE HCL 50 MG TABS: 50 | 30 days supply | Qty: 90 | Fill #2

## 2015-06-05 MED FILL — METOPROLOL TARTRATE 50 MG T: 50 | 30 days supply | Qty: 60 | Fill #1

## 2015-06-05 NOTE — Patient Instructions (Signed)

## 2015-06-05 NOTE — Telephone Encounter (Signed)
Patient did not show to new sleep consult appt.  

## 2015-06-05 NOTE — Progress Notes (Signed)
Subjective:    Patient ID: Brandy Roberts is a 45 y.o. female.  HPI     Star Age, MD, PhD [facility] [address] [place], [place] [address]  Dear [name],   I saw your patient, [name], upon your kind request in my clinic today for initial consultation of her sleep disorder, in particular, concern for underlying obstructive sleep apnea. The patient is unaccompanied today. As you know, [name] is a 45 year old right-handed woman with an underlying medical history of hypertension, smoking, obesity, and right thalamic and right corona radiata strokes in May 2016, who reports snoring and excessive daytime somnolence. She reports having had a sleep study in the past, over 5 years ago with results unavailable for my review today. She was not treated with CPAP at the time but does not know the results of the sleep test. I reviewed your office note from 05/01/2015. She reports a bedtime of around 10 PM and arises time that varies, she may be up by 11 AM. She is a restless sleeper. She does not drink caffeine daily, usually coffee a be twice a week and so does maybe twice a week. She does not drink alcohol. She says she quit smoking but smells strongly of cigarette smoke. She says that her boyfriend smokes around her. She lives at home with her 32 year old son. She has another son age 47 who is on his own. She does not currently work. 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 She denies restless leg symptoms or leg twitching while asleep. Her Epworth sleepiness score is 12 out of 24 today, her fatigue score is 40 out of 63 today. She has a family history of obstructive sleep apnea in her sister, who uses a CPAP machine. She has occasional morning headaches and denies nocturia.  Her Past Medical History Is Significant For: Past Medical History  Diagnosis Date  . Hypertension   . Stroke ([place])   .  Hyperlipidemia   . Headache     Her Past Surgical History Is Significant For: Past Surgical History  Procedure Laterality Date  . Cesarean section    . Tonsillectomy    . Ganglion cyst excision    . Tee without cardioversion N/A 10/09/2014    Procedure: TRANSESOPHAGEAL ECHOCARDIOGRAM (TEE);  Surgeon: Sanda Klein, MD;  Location: [facility] ENDOSCOPY;  Service: Cardiovascular;  Laterality: N/A;   Her Family History Is Significant For: Family History  Problem Relation Age of Onset  . Cancer Mother   . Hypertension Mother   . Hypertension Sister     Her Social History Is Significant For: Social History   Social History  . Marital Status: Single    Spouse Name: N/A  . Number of Children: 2  . Years of Education: 12   Occupational History  . N/A    Social History Main Topics  . Smoking status: Former Smoker -- 0.25 packs/day    Quit date: 11/01/2014  . Smokeless tobacco: Never Used  . Alcohol Use: No  . Drug Use: No  . Sexual Activity: Not Asked   Other Topics Concern  . 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 amLODipine (NORVASC) 10 MG  tablet   . atorvastatin (LIPITOR) 10 MG tablet TAKE 1 TABLET BY MOUTH DAILY AT 6 PM  . butalbital-acetaminophen-caffeine (FIORICET, ESGIC) 50-325-40 MG tablet Take 1 tablet by mouth every 6 (six) hours as needed for headache.  . clopidogrel (PLAVIX) 75 MG tablet Take 1 tablet (75 mg total) by mouth daily.  [name] gabapentin (NEURONTIN) 300 MG capsule Take 1 capsule (300 mg total) by mouth at bedtime.  . hydrALAZINE (APRESOLINE) 50 MG tablet Take 1 tablet (50 mg total) by mouth 3 (three) times daily with meals.  . hydrochlorothiazide (HYDRODIURIL) 25 MG tablet Take 1 tablet (25 mg total) by mouth daily.  [name] lisinopril (PRINIVIL,ZESTRIL)  20 MG tablet Take 1 tablet (20 mg total) by mouth 2 (two) times daily.  . metoprolol (LOPRESSOR) 50 MG tablet Take 1 tablet (50 mg total) by mouth 2 (two) times daily.  [name] spironolactone (ALDACTONE) 50 MG tablet Take 1 tablet (50 mg total) by mouth daily.  . traMADol (ULTRAM) 50 MG tablet Take 1 tablet (50 mg total) by mouth every 6 (six) hours as needed (Headache).  . [DISCONTINUED] amLODipine (NORVASC) 5 MG tablet Take 1 tablet (5 mg total) by mouth daily. (Patient taking differently: Take 10 mg by mouth daily. )   No facility-administered encounter medications on file as of 06/05/2015.  :  Review of Systems:  Out of a complete 14 point review of systems, all are reviewed and negative with the exception of these symptoms as listed below:   Review of Systems  Eyes:       Double vision   Neurological: Positive for dizziness and headaches.       Sometimes has trouble falling asleep, snoring, witnessed apnea, wakes up feeling tired, patient falls asleep after taking her medications, constant headaches, daytime tiredness, takes naps, restless legs  Psychiatric/Behavioral:       Not enough sleep   Epworth Sleepiness Scale 0= would never doze 1= slight chance of dozing 2= moderate chance of dozing 3= high chance of dozing  Sitting and reading:1 Watching TV:1 Sitting inactive in a public place (ex. Theater or meeting):3 As a passenger in a car for an hour without a break:0 Lying down to rest in the afternoon:1 Sitting and talking to someone:3 Sitting quietly after lunch (no alcohol):0 In a car, while stopped in traffic:3 Total:12 Objective:  Neurologic Exam  Physical Exam Physical Examination:   Filed Vitals:   06/05/15 1507  BP: 114/68  Pulse: 70  Resp: 16    General Examination: The patient is a very pleasant 45 y.o. female in no acute distress. She appears well-developed and well-nourished and adequately groomed.   HEENT: Normocephalic, atraumatic, pupils are equal, round  and reactive to light and accommodation. Extraocular tracking is good without limitation to gaze excursion or nystagmus noted. Normal smooth pursuit is noted. Hearing is grossly intact. Face is symmetric with normal facial animation and normal facial sensation. Speech is clear with no dysarthria noted. There is no hypophonia. There is no lip, neck/head, jaw or voice tremor. Neck is supple with full range of passive and active motion. There are no carotid bruits on auscultation. Oropharynx exam reveals: mild mouth dryness, adequate dental hygiene and mild airway crowding, due to narrow airway entry and larger. Mallampati is class II. Tongue protrudes centrally and palate elevates symmetrically. Tonsils are absent. Neck size is 13 7/8 inches. She has a Mild overbite. Nasal inspection reveals no significant nasal mucosal bogginess or redness and no septal deviation.   Chest: Clear  to auscultation without wheezing, rhonchi or crackles noted.  Heart: S1+S2+0, regular and normal without murmurs, rubs or gallops noted.   Abdomen: Soft, non-tender and non-distended with normal bowel sounds appreciated on auscultation.  Extremities: There is no pitting edema in the distal lower extremities bilaterally. Pedal pulses are intact. AFO in place on the L.  Skin: Warm and dry without trophic changes noted. There are no varicose veins.  Musculoskeletal: exam reveals no obvious joint deformities, tenderness or joint swelling or erythema.   Neurologically:  Mental status: The patient is awake, alert and oriented in all 4 spheres. Her immediate and remote memory, attention, language skills and fund of knowledge are appropriate. There is no evidence of aphasia, agnosia, apraxia or anomia. Speech is clear with normal prosody and enunciation. Thought process is linear. Mood is normal and affect is normal.  Cranial nerves II - XII are as described above under HEENT exam. In addition: shoulder shrug is normal with equal  shoulder height noted. Motor exam: Normal bulk, strength and tone is noted on the right. She has mild weakness in the left upper and left lower extremity, with a mild giveaway component noted. There is no drift, tremor or rebound. Romberg is negative. Reflexes are 2+ throughout. Fine motor skills and coordination: mildly impaired on the LUE and LLE.   Cerebellar testing: No dysmetria or intention tremor on finger to nose testing. Heel to shin is unremarkable bilaterally. There is no truncal or gait ataxia.  Sensory exam: decreased to all modalities on the L.  Gait, station and balance: She stands with difficulty. No veering to one side is noted. No leaning to one side is noted. Posture is age-appropriate and stance is slightly wide based. She walks with a 2 wheeled walker. Tandem walk is not possible.   Assessment and Plan:   In summary, Jovonna Olds is a very pleasant 45 y.o.-year old female with an underlying medical history of hypertension, smoking, obesity, and right thalamic and right corona radiata strokes in May 2016, whose history and physical exam are indeed concerning for obstructive sleep apnea (OSA). I had a long chat with the patient about my findings and the diagnosis of OSA, its prognosis and treatment options. We talked about medical treatments, surgical interventions and non-pharmacological approaches. I explained in particular the risks and ramifications of untreated moderate to severe OSA, especially with respect to developing cardiovascular disease down the Road, including congestive heart failure, difficult to treat hypertension, cardiac arrhythmias, or stroke. Even type 2 diabetes has, in part, been linked to untreated OSA. Symptoms of untreated OSA include daytime sleepiness, memory problems, mood irritability and mood disorder such as depression and anxiety, lack of energy, as well as recurrent headaches, especially morning headaches. We talked about smoking cessation and trying  to maintain a healthy lifestyle in general, as well as the importance of weight control. I also talked to her about secondhand smoke exposure. I encouraged the patient to eat healthy, exercise daily and keep well hydrated, to keep a scheduled bedtime and wake time routine, to not skip any meals and eat healthy snacks in between meals. I advised the patient not to drive when feeling sleepy. I recommended the following at this time: sleep study with potential positive airway pressure titration. (We will score hypopneas at 4% and split the sleep study into diagnostic and treatment portion, if the estimated. 2 hour AHI is >15/h).   I explained the sleep test procedure to the patient and also outlined possible surgical and  non-surgical treatment options of OSA, including the use of a custom-made dental device (which would require a referral to a specialist dentist or oral surgeon), upper airway surgical options, such as pillar implants, radiofrequency surgery, tongue base surgery, and UPPP (which would involve a referral to an ENT surgeon). Rarely, jaw surgery such as mandibular advancement may be considered.  I also explained the CPAP treatment option to the patient, who indicated that she would be willing to try CPAP if the need arises. I explained the importance of being compliant with PAP treatment, not only for insurance purposes but primarily to improve Her symptoms, and for the patient's long term health benefit, including to reduce Her cardiovascular risks. I answered all her questions today and the patient was in agreement. I would like to see her back after the sleep study is completed and encouraged her to call with any interim questions, concerns, problems or updates.   Thank you very much for allowing me to participate in the care of this nice patient. If I can be of any further assistance to you please do not hesitate to talk to me.  Sincerely,   Star Age, MD, PhD

## 2015-06-06 ENCOUNTER — Ambulatory Visit (HOSPITAL_COMMUNITY): Admission: RE | Admit: 2015-06-06 | Payer: Medicaid Other | Source: Ambulatory Visit

## 2015-06-07 ENCOUNTER — Encounter: Payer: Self-pay | Admitting: Internal Medicine

## 2015-06-07 ENCOUNTER — Ambulatory Visit: Payer: Medicaid Other | Attending: Internal Medicine | Admitting: Internal Medicine

## 2015-06-07 VITALS — BP 110/71 | HR 96 | Temp 98.0°F | Resp 16 | Ht 66.0 in | Wt 185.0 lb

## 2015-06-07 DIAGNOSIS — R4589 Other symptoms and signs involving emotional state: Secondary | ICD-10-CM

## 2015-06-07 DIAGNOSIS — I10 Essential (primary) hypertension: Secondary | ICD-10-CM | POA: Diagnosis present

## 2015-06-07 DIAGNOSIS — D649 Anemia, unspecified: Secondary | ICD-10-CM | POA: Insufficient documentation

## 2015-06-07 DIAGNOSIS — Z79899 Other long term (current) drug therapy: Secondary | ICD-10-CM | POA: Insufficient documentation

## 2015-06-07 DIAGNOSIS — F329 Major depressive disorder, single episode, unspecified: Secondary | ICD-10-CM | POA: Insufficient documentation

## 2015-06-07 LAB — CBC
HCT: 37.1 % (ref 36.0–46.0)
Hemoglobin: 12.3 g/dL (ref 12.0–15.0)
MCH: 29.6 pg (ref 26.0–34.0)
MCHC: 33.2 g/dL (ref 30.0–36.0)
MCV: 89.2 fL (ref 78.0–100.0)
MPV: 9.6 fL (ref 8.6–12.4)
PLATELETS: 295 10*3/uL (ref 150–400)
RBC: 4.16 MIL/uL (ref 3.87–5.11)
RDW: 14.5 % (ref 11.5–15.5)
WBC: 8.4 10*3/uL (ref 4.0–10.5)

## 2015-06-07 LAB — FERRITIN: FERRITIN: 39 ng/mL (ref 10–291)

## 2015-06-07 NOTE — Progress Notes (Signed)
Patient here for follow up on her HTN 

## 2015-06-07 NOTE — Progress Notes (Signed)
Patient ID: Brandy Roberts, female   DOB: 03-03-1971, 45 y.o.   MRN: [id number] Subjective:  Brandy Roberts is a 45 y.o. female with hypertension. Patient reports that she was seen by her Cardiologist earlier today and told to decreased her Amlodipine to 5 mg. She states that her last echo improved significantly. She has completed physical therapy and now states that she is able to ambulate better with her walker but she still has some left sided weakness. She denies falls.  Patient reports that she has been feeling depressed and feels like it is related to her stroke. She desires therapy and treatment if available.   Current Outpatient Prescriptions  Medication Sig Dispense Refill  . amLODipine (NORVASC) 10 MG tablet   3  . atorvastatin (LIPITOR) 10 MG tablet TAKE 1 TABLET BY MOUTH DAILY AT 6 PM 30 tablet 2  . butalbital-acetaminophen-caffeine (FIORICET, ESGIC) 50-325-40 MG tablet Take 1 tablet by mouth every 6 (six) hours as needed for headache. 15 tablet 3  . clopidogrel (PLAVIX) 75 MG tablet Take 1 tablet (75 mg total) by mouth daily. 30 tablet 3  . gabapentin (NEURONTIN) 300 MG capsule Take 1 capsule (300 mg total) by mouth at bedtime. 30 capsule 1  . hydrALAZINE (APRESOLINE) 50 MG tablet Take 1 tablet (50 mg total) by mouth 3 (three) times daily with meals. 90 tablet 2  . hydrochlorothiazide (HYDRODIURIL) 25 MG tablet Take 1 tablet (25 mg total) by mouth daily. 30 tablet 3  . lisinopril (PRINIVIL,ZESTRIL) 20 MG tablet Take 1 tablet (20 mg total) by mouth 2 (two) times daily. 60 tablet 3  . metoprolol (LOPRESSOR) 50 MG tablet Take 1 tablet (50 mg total) by mouth 2 (two) times daily. 60 tablet 3  . spironolactone (ALDACTONE) 50 MG tablet Take 1 tablet (50 mg total) by mouth daily. 30 tablet 3  . traMADol (ULTRAM) 50 MG tablet Take 1 tablet (50 mg total) by mouth every 6 (six) hours as needed (Headache). 60 tablet 0   No current facility-administered medications for this visit.    ROS:  Other than what is stated in HPI, all other systems are negative.    Objective:  BP 110/71 mmHg  Pulse 96  Temp(Src) 98 F (36.7 C)  Resp 16  Ht 5\' 6"  (1.676 m)  Wt 185 lb (83.915 kg)  BMI 29.87 kg/m2  SpO2 100%  Appearance alert, well appearing, and in no distress, oriented to person, place, and time and overweight. General exam BP noted to be well controlled today in office, S1, S2 normal, no gallop, no murmur, chest clear, no JVD, no HSM, no edema.  Lab review: orders written for new lab studies as appropriate; see orders.   Assessment:   Brandy Roberts was seen today for follow-up.  Diagnoses and all orders for this visit:  Essential hypertension -     COMPLETE METABOLIC PANEL WITH GFR BP is stable, she will continue to follow up with her Cardiologist.   Anemia, unspecified anemia type -     Ferritin -     CBC Will recheck today.   Depressed mood PHQ-9 was 15. I have referred patient to family services of the [place] for counseling. Depression screen [facility] 2/9 06/08/2015 06/07/2015 02/20/2015  Decreased Interest 1 2 3   Down, Depressed, Hopeless - 3 3  PHQ - 2 Score 1 5 6   Altered sleeping - 1 -  Tired, decreased energy - 3 -  Change in appetite - 0 -  Feeling bad or failure about  yourself  - 2 -  Trouble concentrating - 1 -  Moving slowly or fidgety/restless - 2 -  Suicidal thoughts - 1 -  PHQ-9 Score - 15 -    Return in about 1 week (around 06/14/2015) for pap/breast exam and 6 mo routine f/u. Lipid panel next week on return visit   Lance Bosch, NP 06/11/2015 10:50 PM

## 2015-06-08 ENCOUNTER — Encounter: Payer: Medicaid Other | Attending: Physical Medicine & Rehabilitation

## 2015-06-08 ENCOUNTER — Encounter: Payer: Self-pay | Admitting: Physical Medicine & Rehabilitation

## 2015-06-08 ENCOUNTER — Ambulatory Visit (HOSPITAL_BASED_OUTPATIENT_CLINIC_OR_DEPARTMENT_OTHER): Payer: Medicaid Other | Admitting: Physical Medicine & Rehabilitation

## 2015-06-08 VITALS — BP 109/48 | HR 66 | Resp 14

## 2015-06-08 DIAGNOSIS — R270 Ataxia, unspecified: Secondary | ICD-10-CM | POA: Insufficient documentation

## 2015-06-08 DIAGNOSIS — G8194 Hemiplegia, unspecified affecting left nondominant side: Secondary | ICD-10-CM

## 2015-06-08 DIAGNOSIS — I6931 Attention and concentration deficit following cerebral infarction: Secondary | ICD-10-CM | POA: Insufficient documentation

## 2015-06-08 DIAGNOSIS — I10 Essential (primary) hypertension: Secondary | ICD-10-CM | POA: Diagnosis present

## 2015-06-08 DIAGNOSIS — R208 Other disturbances of skin sensation: Secondary | ICD-10-CM

## 2015-06-08 DIAGNOSIS — I69398 Other sequelae of cerebral infarction: Secondary | ICD-10-CM

## 2015-06-08 DIAGNOSIS — I63431 Cerebral infarction due to embolism of right posterior cerebral artery: Secondary | ICD-10-CM | POA: Insufficient documentation

## 2015-06-08 DIAGNOSIS — R269 Unspecified abnormalities of gait and mobility: Secondary | ICD-10-CM

## 2015-06-08 DIAGNOSIS — I69898 Other sequelae of other cerebrovascular disease: Secondary | ICD-10-CM

## 2015-06-08 DIAGNOSIS — R209 Unspecified disturbances of skin sensation: Principal | ICD-10-CM

## 2015-06-08 LAB — COMPLETE METABOLIC PANEL WITH GFR
ALT: 17 U/L (ref 6–29)
AST: 16 U/L (ref 10–35)
Albumin: 4.2 g/dL (ref 3.6–5.1)
Alkaline Phosphatase: 61 U/L (ref 33–115)
BILIRUBIN TOTAL: 0.2 mg/dL (ref 0.2–1.2)
BUN: 23 mg/dL (ref 7–25)
CO2: 24 mmol/L (ref 20–31)
CREATININE: 1.24 mg/dL — AB (ref 0.50–1.10)
Calcium: 9.7 mg/dL (ref 8.6–10.2)
Chloride: 108 mmol/L (ref 98–110)
GFR, Est African American: 61 mL/min (ref >=60)
GFR, Est Non African American: 53 mL/min — ABNORMAL LOW (ref >=60)
GLUCOSE: 118 mg/dL — AB (ref 65–99)
Potassium: 4.3 mmol/L (ref 3.5–5.3)
SODIUM: 140 mmol/L (ref 135–146)
TOTAL PROTEIN: 7.7 g/dL (ref 6.1–8.1)

## 2015-06-08 NOTE — Patient Instructions (Signed)
If you still need the gabapentin you may ask your primary physician to write for it

## 2015-06-08 NOTE — Progress Notes (Signed)
Subjective:    Patient ID: Brandy Roberts, female    DOB: Nov 28, 1970, 45 y.o.   MRN: [id number] Bilateral CVA 10/06/15 HPI Patient returns today to assess for work readiness. Patient continues to walk with a walker, Still using AFO on the left foot for ambulation.. Patient is able to dress and bathe herself. Patient unable to drive. Patient is able to shower by herself but needs to hold onto a shower rail. Used to work in housekeeping at [employer].   Pain Inventory Average Pain 7 Pain Right Now 7 My pain is aching  In the last 24 hours, has pain interfered with the following? General activity 4 Relation with others 5 Enjoyment of life 4 What TIME of day is your pain at its worst? evening Sleep (in general) Fair  Pain is worse with: walking Pain improves with: heat/ice Relief from Meds: 6  Mobility walk with assistance use a walker how many minutes can you walk? 10 ability to climb steps?  yes do you drive?  no transfers alone Do you have any goals in this area?  yes  Function not employed: date last employed . I need assistance with the following:  meal prep and household duties  Neuro/Psych weakness dizziness depression  Prior Studies Any changes since last visit?  no  Physicians involved in your care Any changes since last visit?  no   Family History  Problem Relation Age of Onset  . Cancer Mother   . Hypertension Mother   . Hypertension Sister    Social History   Social History  . Marital Status: Single    Spouse Name: N/A  . Number of Children: 2  . Years of Education: 12   Occupational History  . N/A    Social History Main Topics  . Smoking status: Former Smoker -- 0.25 packs/day    Quit date: 11/01/2014  . Smokeless tobacco: Never Used  . Alcohol Use: No  . Drug Use: No  . Sexual Activity: Not Asked   Other Topics Concern  . 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 Cesarean section    . Tonsillectomy    . Ganglion cyst excision    . Tee without cardioversion N/A 10/09/2014    Procedure: TRANSESOPHAGEAL ECHOCARDIOGRAM (TEE);  Surgeon: Sanda Klein, MD;  Location: [facility] ENDOSCOPY;  Service: Cardiovascular;  Laterality: N/A;   Past Medical History  Diagnosis Date  . Hypertension   . Stroke ([place])   . Hyperlipidemia   . Headache    BP 109/48 mmHg  Pulse 66  Resp 14  SpO2 98%  Opioid Risk Score:   Fall Risk Score:  `1  Depression screen PHQ 2/9  Depression screen [facility] 2/9 06/08/2015 06/07/2015 02/20/2015 11/30/2014 11/23/2014 10/31/2014 10/23/2014  Decreased Interest 1 2 3 3 2  0 2  Down, Depressed, Hopeless - 3 3 - 3 0 1  PHQ - 2 Score 1 5 6 3 5  0 3  Altered sleeping - 1 - 3 2 - 3  Tired, decreased energy - 3 - 3 3 - 3  Change in appetite - 0 - - 1 - 1  Feeling bad or failure about yourself  - 2 - 0 0 - 2  Trouble concentrating - 1 - 1 2 - 0  Moving slowly or fidgety/restless - 2 - - 0 -  1  Suicidal thoughts - 1 - - 0 - 0  PHQ-9 Score - 15 - 10 13 - 13     Review of Systems  All other systems reviewed and are negative.      Objective:   Physical Exam  Ambulates with some toe drag on the left side even with AFO on. She can walk with close standby assist but holds onto the wall with the right hand without the walker. 2 minus ankle dorsiflexion and toe extension 3 minus ankle plantarflexion, 2 minus toe flexion 4 at the knee extensor, 3 minus at the hip flexor.  Left upper extremity 4 minus at the deltoid biceps triceps grip She has decreased fine motor with finger-thumb opposition left side She has evidence of dysdiadochokinesis with rapid alternating movements on the left side.      Assessment & Plan:  1. Right subcortical infarct with left hemiparesis onset 10/06/2014. I believe she has plateaued with her recovery. Patient states she has already applied for Social Security  disability and has received it. I do not think she can go back to work. She is asking for T to this effect which I have provided. No further PT OT is needed at this point I'll see her back on a when necessary basis

## 2015-06-12 ENCOUNTER — Telehealth: Payer: Self-pay

## 2015-06-12 ENCOUNTER — Ambulatory Visit (INDEPENDENT_AMBULATORY_CARE_PROVIDER_SITE_OTHER): Payer: Medicaid Other | Admitting: Neurology

## 2015-06-12 DIAGNOSIS — G479 Sleep disorder, unspecified: Secondary | ICD-10-CM

## 2015-06-12 DIAGNOSIS — G4720 Circadian rhythm sleep disorder, unspecified type: Secondary | ICD-10-CM

## 2015-06-12 DIAGNOSIS — G4733 Obstructive sleep apnea (adult) (pediatric): Secondary | ICD-10-CM | POA: Diagnosis not present

## 2015-06-12 DIAGNOSIS — G4761 Periodic limb movement disorder: Secondary | ICD-10-CM

## 2015-06-12 NOTE — Sleep Study (Signed)
Please see the scanned sleep study interpretation located in the Procedure tab within the Chart Review section. 

## 2015-06-12 NOTE — Telephone Encounter (Signed)
Called patient this am Patient not available Message left on voice mail to return our call 

## 2015-06-12 NOTE — Telephone Encounter (Signed)
-----   Message from Lance Bosch, NP sent at 06/10/2015  5:11 PM EST ----- Sugar was slightly elevated, kidney function is elevated more than 3 months ago. She needs to be sure to avoid all NSAID's.

## 2015-06-14 ENCOUNTER — Other Ambulatory Visit (HOSPITAL_COMMUNITY)
Admission: RE | Admit: 2015-06-14 | Discharge: 2015-06-14 | Disposition: A | Discharge status: Home / Self Care | Payer: Medicaid Other | Source: Ambulatory Visit | Attending: Internal Medicine | Admitting: Internal Medicine

## 2015-06-14 ENCOUNTER — Telehealth: Payer: Self-pay | Admitting: Neurology

## 2015-06-14 ENCOUNTER — Encounter: Payer: Self-pay | Admitting: Internal Medicine

## 2015-06-14 ENCOUNTER — Ambulatory Visit: Payer: Medicaid Other | Attending: Internal Medicine | Admitting: Internal Medicine

## 2015-06-14 VITALS — BP 126/74 | HR 76 | Temp 98.0°F | Resp 16 | Ht 66.0 in | Wt 182.0 lb

## 2015-06-14 DIAGNOSIS — Z1151 Encounter for screening for human papillomavirus (HPV): Secondary | ICD-10-CM | POA: Insufficient documentation

## 2015-06-14 DIAGNOSIS — Z8673 Personal history of transient ischemic attack (TIA), and cerebral infarction without residual deficits: Secondary | ICD-10-CM | POA: Diagnosis not present

## 2015-06-14 DIAGNOSIS — Z1272 Encounter for screening for malignant neoplasm of vagina: Secondary | ICD-10-CM | POA: Insufficient documentation

## 2015-06-14 DIAGNOSIS — Z01419 Encounter for gynecological examination (general) (routine) without abnormal findings: Secondary | ICD-10-CM | POA: Insufficient documentation

## 2015-06-14 DIAGNOSIS — E785 Hyperlipidemia, unspecified: Secondary | ICD-10-CM | POA: Diagnosis not present

## 2015-06-14 DIAGNOSIS — Z809 Family history of malignant neoplasm, unspecified: Secondary | ICD-10-CM | POA: Insufficient documentation

## 2015-06-14 DIAGNOSIS — Z01411 Encounter for gynecological examination (general) (routine) with abnormal findings: Secondary | ICD-10-CM | POA: Diagnosis present

## 2015-06-14 DIAGNOSIS — Z87891 Personal history of nicotine dependence: Secondary | ICD-10-CM | POA: Diagnosis not present

## 2015-06-14 DIAGNOSIS — Z124 Encounter for screening for malignant neoplasm of cervix: Secondary | ICD-10-CM

## 2015-06-14 DIAGNOSIS — N63 Unspecified lump in unspecified breast: Secondary | ICD-10-CM

## 2015-06-14 DIAGNOSIS — I10 Essential (primary) hypertension: Secondary | ICD-10-CM | POA: Diagnosis not present

## 2015-06-14 DIAGNOSIS — Z79899 Other long term (current) drug therapy: Secondary | ICD-10-CM | POA: Diagnosis not present

## 2015-06-14 DIAGNOSIS — Z0000 Encounter for general adult medical examination without abnormal findings: Secondary | ICD-10-CM | POA: Diagnosis not present

## 2015-06-14 NOTE — Progress Notes (Signed)
Patient ID: Brandy Roberts, female   DOB: Mar 26, 1971, 45 y.o.   MRN: [id number]  CC: pap smear  HPI: Brandy Roberts is a 45 y.o. female here today for a pap smear.  Patient has past medical history of HTN, Stroke with residual weakness, HLD. Patient reports that she has not had a pelvic exam in several years. She denies complaints of vaginal discharge, itch, odor, dysuria. She is not up to date on her mammograms. She has no complaints at this time.   No Known Allergies Past Medical History  Diagnosis Date  . Hypertension   . Stroke ([place])   . Hyperlipidemia   . Headache    Current Outpatient Prescriptions on File Prior to Visit  Medication Sig Dispense Refill  . amLODipine (NORVASC) 10 MG tablet Take 5 mg by mouth daily.   3  . atorvastatin (LIPITOR) 10 MG tablet TAKE 1 TABLET BY MOUTH DAILY AT 6 PM 30 tablet 2  . butalbital-acetaminophen-caffeine (FIORICET, ESGIC) 50-325-40 MG tablet Take 1 tablet by mouth every 6 (six) hours as needed for headache. 15 tablet 3  . clopidogrel (PLAVIX) 75 MG tablet Take 1 tablet (75 mg total) by mouth daily. 30 tablet 3  . gabapentin (NEURONTIN) 300 MG capsule Take 1 capsule (300 mg total) by mouth at bedtime. 30 capsule 1  . hydrALAZINE (APRESOLINE) 50 MG tablet Take 1 tablet (50 mg total) by mouth 3 (three) times daily with meals. 90 tablet 2  . hydrochlorothiazide (HYDRODIURIL) 25 MG tablet Take 1 tablet (25 mg total) by mouth daily. 30 tablet 3  . lisinopril (PRINIVIL,ZESTRIL) 20 MG tablet Take 1 tablet (20 mg total) by mouth 2 (two) times daily. 60 tablet 3  . metoprolol (LOPRESSOR) 50 MG tablet Take 1 tablet (50 mg total) by mouth 2 (two) times daily. 60 tablet 3  . spironolactone (ALDACTONE) 50 MG tablet Take 1 tablet (50 mg total) by mouth daily. 30 tablet 3  . traMADol (ULTRAM) 50 MG tablet Take 1 tablet (50 mg total) by mouth every 6 (six) hours as needed (Headache). 60 tablet 0   No current facility-administered medications on file prior  to visit.   Family History  Problem Relation Age of Onset  . Cancer Mother   . Hypertension Mother   . Hypertension Sister    Social History   Social History  . Marital Status: Single    Spouse Name: N/A  . Number of Children: 2  . Years of Education: 12   Occupational History  . N/A    Social History Main Topics  . Smoking status: Former Smoker -- 0.25 packs/day    Quit date: 11/01/2014  . Smokeless tobacco: Never Used  . Alcohol Use: No  . Drug Use: No  . Sexual Activity: Not on file   Other Topics Concern  . 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 HENT: Negative for ear pain, nosebleeds, congestion, facial swelling, rhinorrhea, neck pain, neck stiffness and ear discharge.  Eyes: Negative for pain, discharge, redness, itching and visual disturbance. Respiratory: Negative for cough, choking, chest tightness, shortness of breath, wheezing and stridor.  Cardiovascular: Negative for chest pain, palpitations and leg swelling. Gastrointestinal: Negative for abdominal distention. Genitourinary: Negative for dysuria, urgency, frequency, hematuria, flank pain, decreased urine volume, difficulty urinating and dyspareunia.  Musculoskeletal: Negative for back pain, joint swelling, arthralgias and gait problem. Neurological: Negative for dizziness, tremors, seizures, syncope, facial asymmetry, speech difficulty, weakness, light-headedness, numbness and headaches.  Hematological: Negative for adenopathy. Does not bruise/bleed easily. Psychiatric/Behavioral: Negative for hallucinations, behavioral problems, confusion, dysphoric mood, decreased concentration and agitation.    Objective:   Filed Vitals:   06/14/15 1546  BP: 126/74  Pulse: 76  Temp: 98 F (36.7 C)  Resp: 16     Physical Exam  Pulmonary/Chest: Right breast exhibits mass. Right breast exhibits no inverted nipple. Left breast exhibits mass. Left breast exhibits no inverted nipple.    Abdominal: Soft. Bowel sounds are normal. She exhibits no distension. There is no tenderness.  Genitourinary: Uterus normal. No breast tenderness or discharge. Cervix exhibits no motion tenderness, no discharge and no friability. Right adnexum displays no tenderness. Left adnexum displays no tenderness. Vaginal discharge found.  Lymphadenopathy:       Right: No inguinal adenopathy present.       Left: No inguinal adenopathy present.     Lab Results  Component Value Date   WBC 8.4 06/07/2015   HGB 12.3 06/07/2015   HCT 37.1 06/07/2015   MCV 89.2 06/07/2015   PLT 295 06/07/2015   Lab Results  Component Value Date   CREATININE 1.24* 06/07/2015   BUN 23 06/07/2015   NA 140 06/07/2015   K 4.3 06/07/2015   CL 108 06/07/2015   CO2 24 06/07/2015    Lab Results  Component Value Date   HGBA1C 5.8* 10/07/2014   Lipid Panel     Component Value Date/Time   CHOL 179 10/07/2014 0613   TRIG 105 10/07/2014 0613   HDL 45 10/07/2014 0613   CHOLHDL 4.0 10/07/2014 0613   VLDL 21 10/07/2014 0613   LDLCALC 113* 10/07/2014 0613       Assessment and plan:   [name] was seen today for gynecologic exam.  Diagnoses and all orders for this visit:  Papanicolaou smear -     Cytology - PAP [place]  Breast mass in female -     MM DIAG BREAST TOMO BILATERAL; Future  HLD (hyperlipidemia) -     Lipid panel; Future  Return in about 6 months (around 12/12/2015).        Lance Bosch, [facility] [phone] 06/14/2015, 4:34 PM

## 2015-06-14 NOTE — Progress Notes (Signed)
Patient here for her pap and breast exam only

## 2015-06-14 NOTE — Telephone Encounter (Signed)
Patient referred by Dr. Erlinda Hong, seen by me on 06/05/15, diagnostic PSG on 06/12/15.   Please call and notify the patient that the recent sleep study showed borderline obstructive sleep apnea and mild leg twitching. Please inform patient that I would like to go over the details of the study during a follow up appointment. Arrange a followup appointment. Also, route or fax report to PCP and referring MD, if other than PCP.  Once you have spoken to patient, you can close this encounter.   Thanks,  Star Age, MD, PhD [facility])

## 2015-06-15 NOTE — Telephone Encounter (Signed)
Called both numbers and was able to reach patient on cell phone. She is aware of results but did not want to come in until beginning of March. Appt set. I will send copy to PCP.

## 2015-06-15 NOTE — Telephone Encounter (Signed)
Home number has a busy tone. Called cell x2 but it would pick up then hang up on me. I will try again later.

## 2015-06-18 ENCOUNTER — Telehealth: Payer: Self-pay

## 2015-06-18 LAB — CERVICOVAGINAL ANCILLARY ONLY
CHLAMYDIA, DNA PROBE: NEGATIVE
NEISSERIA GONORRHEA: NEGATIVE
Wet Prep (BD Affirm): POSITIVE — AB

## 2015-06-18 LAB — CYTOLOGY - PAP

## 2015-06-18 MED ORDER — METRONIDAZOLE 500 MG PO TABS: 500.0000 mg | ORAL_TABLET | Freq: Two times a day (BID) | ORAL | Status: DC

## 2015-06-18 NOTE — Telephone Encounter (Signed)
-----   Message from Lance Bosch, NP sent at 06/18/2015  2:11 PM EST ----- Patient positive for Bacterial Vaginosis . Please explain this is not a STD, but a imbalance of the vaginal pH. Please send Flagyl 500 mg BID for 7 days. No refills, no alcohol while on this medication.

## 2015-06-18 NOTE — Telephone Encounter (Signed)
Tried to contact patient  Patient not available Left message on voice mail to return our call RX for flagyl sent  To pharmacy here

## 2015-06-19 ENCOUNTER — Other Ambulatory Visit: Payer: Self-pay

## 2015-06-19 ENCOUNTER — Other Ambulatory Visit: Payer: Self-pay | Admitting: Internal Medicine

## 2015-06-19 DIAGNOSIS — N63 Unspecified lump in unspecified breast: Secondary | ICD-10-CM

## 2015-06-21 ENCOUNTER — Telehealth: Payer: Self-pay

## 2015-06-21 NOTE — Telephone Encounter (Signed)
Called patient this am Patient not available Message left on voice mail to return our call 

## 2015-06-21 NOTE — Telephone Encounter (Signed)
-----   Message from Lance Bosch, NP sent at 06/21/2015  9:02 AM EST ----- No STD's

## 2015-06-22 ENCOUNTER — Telehealth: Payer: Self-pay

## 2015-06-22 NOTE — Telephone Encounter (Signed)
Called patient this am Patient not available Message left on voice mail to return our call 

## 2015-06-22 NOTE — Telephone Encounter (Signed)
-----   Message from Lance Bosch, NP sent at 06/22/2015 10:32 AM EST ----- Patient pap is negative for malignancies. Will repeat in 3 years.

## 2015-07-24 MED FILL — BUTALB-ACETAMIN-CAFF 50-325: 50-325-40 | 4 days supply | Qty: 15 | Fill #2

## 2015-07-24 MED FILL — HYDROCHLOROTHIAZIDE 25 MG T: 25 | 30 days supply | Qty: 30 | Fill #3

## 2015-07-24 MED FILL — SPIRONOLACTONE 50 MG TABLET: 50 | 30 days supply | Qty: 30 | Fill #3

## 2015-07-24 MED FILL — ATORVASTATIN 10 MG TABLET: 10 | 30 days supply | Qty: 30 | Fill #2

## 2015-07-24 MED FILL — LISINOPRIL 20 MG TABLET: 20 | 30 days supply | Qty: 60 | Fill #3

## 2015-07-24 MED FILL — CLOPIDOGREL 75 MG TABLET: 75 | 30 days supply | Qty: 30 | Fill #1

## 2015-07-24 MED FILL — METOPROLOL TARTRATE 50 MG T: 50 | 30 days supply | Qty: 60 | Fill #2

## 2015-07-24 MED FILL — AMLODIPINE BESYLATE 10 MG T: 10 | 30 days supply | Qty: 30 | Fill #3

## 2015-07-25 MED FILL — hydrALAZINE HCL 50 MG TABS: 50 | 30 days supply | Qty: 90 | Fill #0

## 2015-07-27 ENCOUNTER — Encounter: Payer: Self-pay | Admitting: Clinical

## 2015-07-27 NOTE — Progress Notes (Signed)
Depression screen [facility] 2/9 06/08/2015 06/07/2015 02/20/2015 11/30/2014 11/23/2014  Decreased Interest 1 2 3 3 2   Down, Depressed, Hopeless - 3 3 - 3  PHQ - 2 Score 1 5 6 3 5   Altered sleeping - 1 - 3 2  Tired, decreased energy - 3 - 3 3  Change in appetite - 0 - - 1  Feeling bad or failure about yourself  - 2 - 0 0  Trouble concentrating - 1 - 1 2  Moving slowly or fidgety/restless - 2 - - 0  Suicidal thoughts - 1 - - 0  PHQ-9 Score - 15 - 10 13   *No plans to end life in last 2 weeks   GAD 7 : Generalized Anxiety Score 06/07/2015  Nervous, Anxious, on Edge 1  Control/stop worrying 2  Worry too much - different things 2  Trouble relaxing 1  Restless 0  Easily annoyed or irritable 1  Afraid - awful might happen 2  Total GAD 7 Score 9

## 2015-08-06 ENCOUNTER — Other Ambulatory Visit: Payer: Self-pay | Admitting: Internal Medicine

## 2015-08-06 ENCOUNTER — Other Ambulatory Visit: Payer: Self-pay | Admitting: Family Medicine

## 2015-08-06 ENCOUNTER — Ambulatory Visit: Payer: Medicaid Other | Admitting: Neurology

## 2015-08-06 MED FILL — CLOPIDOGREL 75 MG TABLET: 75 | 30 days supply | Qty: 30 | Fill #2

## 2015-08-06 MED FILL — GABAPENTIN 300 MG CAPSULE: 300 | 30 days supply | Qty: 30 | Fill #1

## 2015-08-06 MED FILL — BUTALB-ACETAMIN-CAFF 50-325: 50-325-40 | 4 days supply | Qty: 15 | Fill #3

## 2015-08-06 NOTE — Telephone Encounter (Signed)
Pt. Called requesting a med refill on 8 of her prescriptions. Pt. Stated someone stole her prescriptions. Please f/u with pt.

## 2015-08-07 ENCOUNTER — Encounter: Payer: Self-pay | Admitting: Neurology

## 2015-08-09 ENCOUNTER — Ambulatory Visit (INDEPENDENT_AMBULATORY_CARE_PROVIDER_SITE_OTHER): Payer: Medicaid Other | Admitting: Neurology

## 2015-08-09 ENCOUNTER — Encounter: Payer: Self-pay | Admitting: Neurology

## 2015-08-09 VITALS — BP 192/80 | HR 82 | Resp 16 | Ht 66.0 in | Wt 190.0 lb

## 2015-08-09 DIAGNOSIS — I63311 Cerebral infarction due to thrombosis of right middle cerebral artery: Secondary | ICD-10-CM | POA: Diagnosis not present

## 2015-08-09 DIAGNOSIS — E669 Obesity, unspecified: Secondary | ICD-10-CM | POA: Diagnosis not present

## 2015-08-09 DIAGNOSIS — G4733 Obstructive sleep apnea (adult) (pediatric): Secondary | ICD-10-CM

## 2015-08-09 NOTE — Progress Notes (Signed)
Subjective:    Patient ID: Brandy Roberts is a 45 y.o. female.  HPI     Interim history:   Ms. Jerkins is a 45 year old right-handed woman with an underlying medical history of hypertension, smoking, obesity, and right thalamic and right corona radiata strokes in May 2016, who presents for follow-up consultation after her recent sleep study. The patient is unaccompanied today. I first met her on 06/05/2015 at the request of Dr. Erlinda Hong, at which time she reported snoring and excessive daytime somnolence. I invited her back for sleep study. She had a baseline sleep study on 06/12/2015. I went over her test results with her in detail today. Sleep efficiency was 78.9%, sleep latency 44 minutes, wake after sleep onset was 35.5 minutes with mild to moderate sleep fragmentation noted. She had an elevated arousal index. She had a normal percentage for light stage sleep, an increased percentage of slow-wave sleep at 35.9% and a decreased percentage of REM sleep at 15.2% with a prolonged REM latency of 146.5 minutes. She had mild PLMS with an index of 8.9 per hour, resulting in only 1.2 arousals per hour. She had no significant EKG or EEG changes. She had mild intermittent snoring. Total AHI was 5.3 per hour, rising to 8 per hour during REM sleep and 7.3 per hour in the supine position. Average oxygen saturation was 92%, nadir was 88% but they were repeated errors with the oximeter as she repeatedly moved it and had discomfort with it.  Today, 08/09/2015: She reports not sleeping well, not well rested. Has had intermittent headaches, BP a little higher today. Sister has OSA and is on CPAP, doing well with it. Uses 2 wheeled walker. She reports a fall on the right side, coming down some steps, no head injury, no HA then, no LOC.   Previously:  06/05/2015: She reports snoring and excessive daytime somnolence. She reports having had a sleep study in the past, over 5 years ago with results unavailable for my  review today. She was not treated with CPAP at the time but does not know the results of the sleep test. I reviewed your office note from 05/01/2015. She reports a bedtime of around 10 PM and arises time that varies, she may be up by 11 AM. She is a restless sleeper. She does not drink caffeine daily, usually coffee a be twice a week and so does maybe twice a week. She does not drink alcohol. She says she quit smoking but smells strongly of cigarette smoke. She says that her boyfriend smokes around her. She lives at home with her 95 year old son. She has another son age 17 who is on his own. She does not currently work. 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 She denies restless leg symptoms or leg twitching while asleep. Her Epworth sleepiness score is 12 out of 24 today, her fatigue score is 40 out of 63 today. She has a family history of obstructive sleep apnea in her sister, who uses a CPAP machine. She has occasional morning headaches and denies nocturia.  Her Past Medical History Is Significant For: Past Medical History  Diagnosis Date  . Hypertension   . Stroke ([place])   . Hyperlipidemia   . Headache     Her Past Surgical History Is Significant For: Past Surgical History  Procedure Laterality Date  . Cesarean section    . Tonsillectomy    . Ganglion cyst excision    . Tee without  cardioversion N/A 10/09/2014    Procedure: TRANSESOPHAGEAL ECHOCARDIOGRAM (TEE);  Surgeon: Sanda Klein, MD;  Location[facility] ENDOSCOPY;  Service: Cardiovascular;  Laterality: N/A;    Her Family History Is Significant For: Family History  Problem Relation Age of Onset  . Cancer Mother   . Hypertension Mother   . Hypertension Sister     Her Social History Is Significant For: Social History   Social History  . Marital Status: Single    Spouse Name: N/A  . Number of Children: 2  . Years of Education: 12   Occupational History  . N/A    Social History Main Topics  .  Smoking status: Former Smoker -- 0.25 packs/day    Quit date: 11/01/2014  . Smokeless tobacco: Never Used  . Alcohol Use: No  . Drug Use: No  . Sexual Activity: Not Asked   Other Topics Concern  . 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 amLODipine (NORVASC) 10 MG tablet Take 5 mg by mouth daily.   [name] atorvastatin (LIPITOR) 10 MG tablet TAKE 1 TABLET BY MOUTH DAILY AT 6 PM  . atorvastatin (LIPITOR) 10 MG tablet TAKE 1 TABLET BY MOUTH DAILY AT 6 PM  . butalbital-acetaminophen-caffeine (FIORICET, ESGIC) 50-325-40 MG tablet Take 1 tablet by mouth every 6 (six) hours as needed for headache.  . clopidogrel (PLAVIX) 75 MG tablet Take 1 tablet (75 mg total) by mouth daily.  [name] gabapentin (NEURONTIN) 300 MG capsule Take 1 capsule (300 mg total) by mouth at bedtime.  . hydrALAZINE (APRESOLINE) 50 MG tablet Take 1 tablet (50 mg total) by mouth 3 (three) times daily with meals.  . hydrochlorothiazide (HYDRODIURIL) 25 MG tablet TAKE 1 TABLET BY MOUTH DAILY  . lisinopril (PRINIVIL,ZESTRIL) 20 MG tablet TAKE 1 TABLET BY MOUTH 2 TIMES DAILY  . metoprolol (LOPRESSOR) 50 MG tablet Take 1 tablet (50 mg total) by mouth 2 (two) times daily.  . metroNIDAZOLE (FLAGYL) 500 MG tablet Take 1 tablet (500 mg total) by mouth 2 (two) times daily.  [name] spironolactone (ALDACTONE) 50 MG tablet TAKE 1 TABLET BY MOUTH DAILY  . traMADol (ULTRAM) 50 MG tablet Take 1 tablet (50 mg total) by mouth every 6 (six) hours as needed (Headache).  . [DISCONTINUED] amLODipine (NORVASC) 10 MG tablet TAKE 1 TABLET BY MOUTH DAILY.   No facility-administered encounter medications on file as of 08/09/2015.  :  Review of Systems:  Out of a complete 14 point review of systems, all are reviewed and negative with the exception of  these symptoms as listed below:   Review of Systems  Neurological:       Patient is here to discuss sleep study. No new concerns.     Objective:  Neurologic Exam  Physical Exam Physical Examination:   Filed Vitals:   08/09/15 1520  BP: 192/80  Pulse: 82  Resp: 16    General Examination: The patient is a very pleasant 45 y.o. female in no acute distress. She appears well-developed and well-nourished and adequately groomed.   HEENT: Normocephalic, atraumatic, pupils are equal, round and reactive to light and accommodation. Extraocular tracking is good without limitation to gaze excursion or nystagmus noted. Normal smooth pursuit is noted. Hearing is grossly intact. Face is symmetric  with normal facial animation and normal facial sensation. Speech is clear with no dysarthria noted. There is no hypophonia. There is no lip, neck/head, jaw or voice tremor. Neck is supple with full range of passive and active motion. There are no carotid bruits on auscultation. Oropharynx exam reveals: mild mouth dryness, adequate dental hygiene and mild airway crowding, due to narrow airway entry and larger. Mallampati is class II. Tongue protrudes centrally and palate elevates symmetrically. Tonsils are absent.   Chest: Clear to auscultation without wheezing, rhonchi or crackles noted.  Heart: S1+S2+0, regular and normal without murmurs, rubs or gallops noted.   Abdomen: Soft, non-tender and non-distended with normal bowel sounds appreciated on auscultation.  Extremities: There is no pitting edema in the distal lower extremities bilaterally. Pedal pulses are intact. AFO in place on the L.  Skin: Warm and dry without trophic changes noted. There are no varicose veins.  Musculoskeletal: exam reveals no obvious joint deformities, tenderness or joint swelling or erythema.   Neurologically:  Mental status: The patient is awake, alert and oriented in all 4 spheres. Her immediate and remote memory,  attention, language skills and fund of knowledge are appropriate. There is no evidence of aphasia, agnosia, apraxia or anomia. Speech is clear with normal prosody and enunciation. Thought process is linear. Mood is normal and affect is normal.  Cranial nerves II - XII are as described above under HEENT exam. In addition: shoulder shrug is normal with equal shoulder height noted. Motor exam: Normal bulk, strength and tone is noted on the right. She has mild weakness in the left upper and left lower extremity, stable. There is no drift, tremor or rebound. Romberg is negative. Reflexes are 2+ throughout. Fine motor skills and coordination: mildly impaired on the LUE and LLE.   Cerebellar testing: No dysmetria or intention tremor on finger to nose testing. Heel to shin is unremarkable bilaterally. There is no truncal or gait ataxia.  Sensory exam: decreased to all modalities on the L.  Gait, station and balance: She stands with mild difficulty. No veering to one side is noted. No leaning to one side is noted. Posture is age-appropriate and stance is slightly wide based. She walks with a 2 wheeled walker. Tandem walk is not possible.   Assessment and Plan:   In summary, Chanti Golubski is a very pleasant 45 year old female with an underlying medical history of hypertension, recent smoking cessation in January 2017, obesity, and right thalamic and right corona radiata strokes in May 2016, who presents for follow-up consultation of her obstructive sleep apnea, after her recent baseline sleep study in January 2017. I talked to her about her test results. She did have mild intermittent PLMS but very little arousals from that and does not endorse much in the way of restless leg symptoms at this time. We will continue to monitor for that. In light of her history of stroke, and not sleeping well, I suggested we try her on AutoPap therapy at home. Her sister has obstructive sleep apnea and successfully uses a CPAP  machine. The patient has had some headaches lately which could be secondary to rising blood pressure values but it is unclear if her blood pressure may be up from having a mild headache today. She is advised to follow-up with primary care for blood pressure management and to keep her appointment with Dr. Erlinda Hong later this month. Her physical exam is stable with the exception that she has gained a little bit of weight. She does  endorse not drinking enough water and she likes to drink juice cocktails and juices. She is encouraged to try to eliminate excess calories and focus more on water intake. She is advised to use her walker at all times. We talked again about sleep apnea and its treatment options. I think AutoPap trial is appropriate in this context. She is willing to give it a try. I will see her back in about 3 months, sooner if needed. We will [facility] DME [facility] to [facility] as she is familiar with them. I explained the importance of being compliant with PAP treatment, not only for insurance purposes but primarily to improve Her symptoms, and for the patient's long term health benefit, including to reduce Her cardiovascular risks. I answered all her questions today and the patient was in agreement.  I spent 25 minutes in total face-to-face time with the patient, more than 50% of which was spent in counseling and coordination of care, reviewing test results, reviewing medication and discussing or reviewing the diagnosis of OSA, its prognosis and treatment options.

## 2015-08-09 NOTE — Patient Instructions (Signed)
You have borderline or mild obstructive sleep apnea. It may be worthwhile treating this in light of your stroke and your not sleeping well. We will set you up at home with a so called autoPAP machine for treatment of your overall mild obstructive sleep apnea.  I will see you back in follow-up for sleep apnea in about 3 months, keep your appointment with Dr. Erlinda Hong later this month. Please get your eyes checked too.

## 2015-08-10 ENCOUNTER — Other Ambulatory Visit: Payer: Self-pay | Admitting: *Deleted

## 2015-08-10 MED ORDER — ATORVASTATIN CALCIUM 10 MG PO TABS: ORAL_TABLET | ORAL | Status: DC

## 2015-08-10 MED ORDER — SPIRONOLACTONE 50 MG PO TABS: 50.0000 mg | ORAL_TABLET | Freq: Every day | ORAL | Status: DC

## 2015-08-10 MED ORDER — AMLODIPINE BESYLATE 10 MG PO TABS: 5.0000 mg | ORAL_TABLET | Freq: Every day | ORAL | Status: DC

## 2015-08-10 MED ORDER — LISINOPRIL 20 MG PO TABS: 20.0000 mg | ORAL_TABLET | Freq: Two times a day (BID) | ORAL | Status: DC

## 2015-08-10 MED ORDER — HYDROCHLOROTHIAZIDE 25 MG PO TABS: 25.0000 mg | ORAL_TABLET | Freq: Every day | ORAL | Status: DC

## 2015-08-16 MED FILL — SPIRONOLACTONE 50 MG TABLET: 50 | 30 days supply | Qty: 30 | Fill #0

## 2015-08-16 MED FILL — METOPROLOL TARTRATE 50 MG T: 50 | 30 days supply | Qty: 60 | Fill #3

## 2015-08-16 MED FILL — AMLODIPINE BESYLATE 10 MG T: 10 | 30 days supply | Qty: 15 | Fill #0

## 2015-08-16 MED FILL — hydrALAZINE HCL 50 MG TABS: 50 | 30 days supply | Qty: 90 | Fill #1

## 2015-08-16 MED FILL — HYDROCHLOROTHIAZIDE 25 MG T: 25 | 30 days supply | Qty: 30 | Fill #0

## 2015-08-16 MED FILL — ATORVASTATIN 10 MG TABLET: 10 | 30 days supply | Qty: 30 | Fill #0

## 2015-08-16 MED FILL — LISINOPRIL 20 MG TABLET: 20 | 30 days supply | Qty: 60 | Fill #0

## 2015-08-22 ENCOUNTER — Ambulatory Visit: Payer: Medicaid Other | Admitting: Neurology

## 2015-08-23 ENCOUNTER — Encounter: Payer: Self-pay | Admitting: Neurology

## 2015-09-03 ENCOUNTER — Encounter: Payer: Self-pay | Admitting: Neurology

## 2015-09-03 ENCOUNTER — Ambulatory Visit (INDEPENDENT_AMBULATORY_CARE_PROVIDER_SITE_OTHER): Payer: Medicaid Other | Admitting: Neurology

## 2015-09-03 VITALS — BP 129/67 | HR 76 | Ht 66.0 in | Wt 187.0 lb

## 2015-09-03 DIAGNOSIS — F17200 Nicotine dependence, unspecified, uncomplicated: Secondary | ICD-10-CM | POA: Diagnosis not present

## 2015-09-03 DIAGNOSIS — I63411 Cerebral infarction due to embolism of right middle cerebral artery: Secondary | ICD-10-CM | POA: Diagnosis not present

## 2015-09-03 DIAGNOSIS — I10 Essential (primary) hypertension: Secondary | ICD-10-CM

## 2015-09-03 DIAGNOSIS — G4733 Obstructive sleep apnea (adult) (pediatric): Secondary | ICD-10-CM | POA: Diagnosis not present

## 2015-09-03 DIAGNOSIS — I330 Acute and subacute infective endocarditis: Secondary | ICD-10-CM | POA: Insufficient documentation

## 2015-09-03 NOTE — Patient Instructions (Addendum)
-   continue plavix and lipitor for stroke prevention - continue abstain from smoking - check BP at home and record and compliance with medication - Follow up with your primary care physician for stroke risk factor modification. Recommend maintain blood pressure goal <130/80, diabetes with hemoglobin A1c goal below 6.5% and lipids with LDL cholesterol goal below 70 mg/dL.  - will do TCD emboli detection - continue home exercise  - continue CPAP for OSA - discuss with Dr. Wynonia Lawman about the need for repeat TEE, please call Dr. Wynonia Lawman for appointment - follow up in 4 months.

## 2015-09-03 NOTE — Progress Notes (Signed)
STROKE NEUROLOGY FOLLOW UP NOTE  NAME: Brandy Roberts DOB: 1970/12/01  REASON FOR VISIT: stroke follow up HISTORY FROM: pt and chart  Today we had the pleasure of seeing Brandy Roberts in follow-up at our [facility]rology [facility]. Pt was accompanied by no one.   History Summary Brandy Roberts is a 45 y.o. female with history of HTN not on meds and current smoker was admitted on 10/06/14 for left-sided hemiparesis. MRI showed right thalamus and right corona radiata 2 separate infarcts. CTA head and neck showed left cavernous ICA high grade stenosis. Embolic work up including LE venous doppler and TTE and TEE showed no DVT, EF 40-45% likely due to uncontrolled BP, but AR has a tiny irregularity and can not r/o endocarditis. Cardiology consulted and Dr. Wynonia Lawman did not think that was concerning for endocarditis with no clinical symptoms and negative blood cultures, but he did mention the AR needs to be close monitored. Her BP still high during admission, and renal A ultrasound ruled out RA stenosis. Malignant HTN serology all negative. She was discharged with dural antiplatelet.   01/24/15 follow up - the patient has been doing well. Still has mild left hemiparesis, undergoing PT/OT 2 per week and following up with Dr. Read Drivers. Her BP is better controlled and today is 127/63. She is currently on 6 BP meds. She has quit smoking.   05/01/15 follow up - the patient has been doing the same. BP in good control with 6 BP meds and stated compliance. Complains of episodic headache since stroke, whole head hurts, 10/10, more prominent on the top, with photophobia and phonophobia, but no N/V, associated with blurry vision, double vision, could last all day long. Last episode about 2 weeks ago. Can not take tylenol as per pt. Has not tried fioricet yet. She still smokes.   Interval History During the interval time, pt has been doing the same. Still walk with walker. Has quit smoking. Sleep study showed OSA  and on CPAP now. Tolerating well. Has not seen Dr. Wynonia Lawman yet and no TEE scheduled. TCD MES not done yet as not covered by insurance. She still has HAs, and stated 2-3 per week, lasting one hour also, fioricet helps well, not willing to take daily medication for prevention. She tried once with tramadol but not able to tolerate the medication. BP today 129/67.   REVIEW OF SYSTEMS: Full 14 system review of systems performed and notable only for those listed below and in HPI above, all others are negative:  Constitutional:   Cardiovascular:  Ear/Nose/Throat:   Skin:  Eyes:   Respiratory:   Gastroitestinal:   Genitourinary:  Hematology/Lymphatic:   Endocrine:  Musculoskeletal:   Allergy/Immunology:   Neurological:  HA, speech difficulty, weakness Psychiatric:  Sleep: restless leg  The following represents the patient's updated allergies and side effects list: No Known Allergies  The neurologically relevant items on the patient's problem list were reviewed on today's visit.  Neurologic Examination  A problem focused neurological exam (12 or more points of the single system neurologic examination, vital signs counts as 1 point, cranial nerves count for 8 points) was performed.  Blood pressure 129/67, pulse 76, height 5\' 6"  (1.676 m), weight 187 lb (84.823 kg).  General - Well nourished, well developed, in no apparent distress.  Ophthalmologic - Sharp disc margins OU.  Cardiovascular - Regular rate and rhythm with no murmur.  Mental Status -  Level of arousal and orientation to time, place, and person were intact. Language including  expression, naming, repetition, comprehension was assessed and found intact. Fund of Knowledge was assessed and was intact.  Cranial Nerves II - XII - II - Visual field intact OU. III, IV, VI - Extraocular movements intact. V - Facial sensation decreased on the left, 50% to the right. VII - Facial movement intact bilaterally. VIII - Hearing &  vestibular intact bilaterally. X - Palate elevates symmetrically. XI - Chin turning & shoulder shrug intact bilaterally. XII - Tongue protrusion intact.  Motor Strength - The patient's strength was 5/5 RUE and RLE, 4/5 LUE and LLE but with significant give away weakness on testing (at beginning she mistakenly drop her right UE and then "corrected" herself with left UE drop during the test). in all extremities and pronator drift was absent.  Bulk was normal and fasciculations were absent.   Motor Tone - Muscle tone was assessed at the neck and appendages and was normal.  Reflexes - The patient's reflexes were 1+ in all extremities and she had no pathological reflexes.  Sensory - Light touch, temperature/pinprick were assessed and were decreased on the left, 50% to the right.    Coordination - The patient had normal movements in the hands and feet with no ataxia or dysmetria.  Tremor was absent.  Gait and Station - walk with walker with mild left hemiparetic gait.  Data reviewed: I personally reviewed the images and agree with the radiology interpretations.  Ct Angio Head and neck W/cm &/or Wo Cm  10/07/2014 IMPRESSION: 1. No acute arterial findings. 2. No evidence of infarct progression since yesterday. No hemorrhagic conversion. 3. Diffuse bilateral petrous and cavernous carotid stenosis, likely atherosclerotic. Narrowing is up to moderate at the left petrous cavernous junction. 4. Mild beading of the bilateral V3 segments, question fibromuscular dysplasia. 5. 2 mm outpouching from the right supraclinoid carotid, aneurysm or infundibulum with parent vessel below the resolution of CTA. 6. No arterial stenosis in the neck.   Ct Head Wo Contrast  10/06/2014 IMPRESSION: No acute intracranial abnormality. No definite acute cortical infarction.   Mr Brain Wo Contrast  10/06/2014 IMPRESSION: 1. Small acute lacunar infarcts in the lateral right thalamus/posterior limb right external capsule  (right PCA territory) and also the right corona radiata (right MCA territory). No mass effect or hemorrhage. 2. Superimposed chronic lacunar infarcts in the left corona radiata and left basal ganglia (left MCA territory). 3. In light of the above in the patient's age, consider a source of cardiac or paradoxical emboli, such as PFO. And otherwise consider risk factors for accelerated small vessel disease.   2D Echocardiogram - Mild LV chamber dilatation with severe LVH, LVEF 40-45%. Grade 1diastolic dysfunction with increased filling pressures. Considerhypertrophic cardiomyopathy or severe hypertensive heart disease. Mild to moderate left atrial enlargement. Moderate aortic regurgitation. Trivial tricuspid regurgitation, unable to assessPASP. Cannot exclude PFO - possible small left to right atrialshunt noted on some views. Trivial pericardial effusion.  2D echo 02/26/15 - - Left ventricle: The cavity size was normal. There was moderate concentric hypertrophy. Systolic function was hyperdynamic. The estimated ejection fraction was in the range of 70% to 75%. Wall motion was normal; there were no regional wall motion abnormalities. Doppler parameters are consistent with abnormal left ventricular relaxation (grade 1 diastolic dysfunction). - Aortic valve: There was moderate regurgitation. Valve area (VTI): 1.75 cm^2. Valve area (Vmean): 1.96 cm^2.  LE venous doppler There is no DVT or SVT noted in the bilateral lower extremities.   Renal artery [place] - No evidence of renal  artery stenosis noted bilaterally. - Mildly increased intrarenal resistive indices bilaterally. Etiology unknown.  TEE Hypertrophic left ventricle with mildly decreased LVEF 40-45% due to global hypokinesis. Trileaflet aortic valve with a tiny irregularity, cannot exclude a small vegetation. Moderate-to-severe aortic insufficiency with a commisural jet that has a highly eccentric direction towards the  mitral valve. Possible small (3 x 3.5 mm) vegetation on the left coronary cusp, slightly mobile.  30 day cardiac event monitoring - negative for afib  Component     Latest Ref Rng 10/07/2014  Cholesterol     0 - 200 mg/dL 179  Triglycerides     <150 mg/dL 105  HDL Cholesterol     >40 mg/dL 45  Total CHOL/HDL Ratio      4.0  VLDL     0 - 40 mg/dL 21  LDL (calc)     0 - 99 mg/dL 113 (H)  Norepinephrine      100  Epinephrine      25  Dopamine      REPORT  Total Catecholamines (Nor+Epi)      125  PRA LC/MS/MS      0.20  ALDO / PRA Ratio     0.0 - 30.0 30.0  ALDOSTERONE     0.0 - 30.0 ng/dL 6.0  Hemoglobin A1C     4.8 - 5.6 % 5.8 (H)  Mean Plasma Glucose      120    Assessment: As you may recall, she is a 45 y.o. African American female with PMH of uncontrolled HTN and smoker was admitted on 10/06/14 for right thalamus and right corona radiata 2 separate infarcts, concerning for embolic source vs. Synchronized small vessel events. CTA head and neck showed left cavernous ICA high grade stenosis. Embolic work up including LE venous doppler showed no DVT ,and TTE unremarkable but TEE showed EF 40-45% likely due to uncontrolled BP, and AV regurg and 3x3.40mm irregularity needs to be close monitored. Renal A ultrasound ruled out RA stenosis and malignant HTN serology all negative. She was discharged with dural antiplatelet for 3 months. During the interval time, BP controlled well on 6 BP meds and left sided weakness improved. Repeat 2D echo as recommended by cardiology was unremarkable, but TEE not done yet and pt has not followed up with Dr. Wynonia Lawman. 30 day cardiac monitoring no afib. Pending TCD MES. Sleep study showed OSA on CPAP now. HA continues but helped with fioricet. Pt declined daily migraine preventive meds. Has quit smoking  Plan:  - continue plavix and lipitor for stroke prevention - continue abstain from smoking - check BP at home and record and compliance with  medication - Follow up with your primary care physician for stroke risk factor modification. Recommend maintain blood pressure goal <130/80, diabetes with hemoglobin A1c goal below 6.5% and lipids with LDL cholesterol goal below 70 mg/dL.  - will do TCD emboli detection - continue home exercise  - continue CPAP for OSA - discuss with Dr. Wynonia Lawman about the need for repeat TEE, please call Dr. Wynonia Lawman for appointment - follow up in 4 months.   I spent more than 25 minutes of face to face time with the patient. Greater than 50% of time was spent in counseling and coordination of care.    No orders of the defined types were placed in this encounter.    No orders of the defined types were placed in this encounter.    Patient Instructions  - continue plavix and lipitor for  stroke prevention - continue abstain from smoking - check BP at home and record and compliance with medication - Follow up with your primary care physician for stroke risk factor modification. Recommend maintain blood pressure goal <130/80, diabetes with hemoglobin A1c goal below 6.5% and lipids with LDL cholesterol goal below 70 mg/dL.  - will do TCD emboli detection - continue home exercise  - continue CPAP for OSA - discuss with Dr. Wynonia Lawman about the need for repeat TEE, please call Dr. Wynonia Lawman for appointment - follow up in 4 months.     Rosalin Hawking, MD PhD [facility] [address], [place], [place] [address] [phone]

## 2015-09-12 ENCOUNTER — Other Ambulatory Visit: Payer: Self-pay | Admitting: Neurology

## 2015-09-12 DIAGNOSIS — I63411 Cerebral infarction due to embolism of right middle cerebral artery: Secondary | ICD-10-CM

## 2015-09-24 ENCOUNTER — Telehealth: Payer: Self-pay | Admitting: Neurology

## 2015-09-24 DIAGNOSIS — G4733 Obstructive sleep apnea (adult) (pediatric): Secondary | ICD-10-CM

## 2015-09-24 DIAGNOSIS — Z9989 Dependence on other enabling machines and devices: Principal | ICD-10-CM

## 2015-09-24 NOTE — Telephone Encounter (Signed)
I reviewed patient's AutoPap compliance data from 08/21/2015 through 09/19/2015 which is a total of 30 days during which time she used her machine 23 days with percent used days greater than 4 hours at 73%, indicating adequate compliance with an average usage for all days of 4 hours and 59 minutes, residual AHI 3.8 per hour, 95th percentile pressure of 9.4 cm, leak acceptable with the 95th percentile at 14.9 L/m on a pressure setting of 4-13 cm with EPR. Please advise patient that she should continue with her machine. She is doing well, please advise her not to skip any night sweats however. I would like to go ahead and change her to a set pressure of 10 cm rather than AutoPap. If she is agreeable, we can fax order to her DME company, [facility].

## 2015-09-25 NOTE — Telephone Encounter (Signed)
LM with results below. I advised that Dr. Rexene Alberts felt that a pressure change was necessary and we have sent those orders to [facility].

## 2015-11-13 ENCOUNTER — Ambulatory Visit: Payer: Medicaid Other | Admitting: Neurology

## 2015-11-13 ENCOUNTER — Telehealth: Payer: Self-pay

## 2015-11-13 NOTE — Telephone Encounter (Signed)
Patient did not show to appt today  

## 2015-11-26 ENCOUNTER — Telehealth: Payer: Self-pay | Admitting: Neurology

## 2015-11-26 NOTE — Telephone Encounter (Signed)
Faxed in records for clinical review Telephone [phone] fax [phone]  Case # [id number]  [place] TCD with out monitoring.

## 2015-11-27 NOTE — Telephone Encounter (Signed)
[employer] sent me a fax back stating they wanted all patient's records faxed to [phone]. Noted Done

## 2015-11-29 NOTE — Telephone Encounter (Signed)
Patient was approved for Doppler Case ID [id number] Auth ID [id number] 11/26/2015- 12/26/2015.

## 2016-01-07 ENCOUNTER — Ambulatory Visit: Payer: Medicaid Other | Admitting: Neurology

## 2016-01-08 ENCOUNTER — Encounter: Payer: Self-pay | Admitting: Neurology

## 2016-03-31 ENCOUNTER — Emergency Department (HOSPITAL_COMMUNITY)
Admission: EM | Admit: 2016-03-31 | Discharge: 2016-03-31 | Disposition: A | Discharge status: Home / Self Care | Payer: Medicaid Other | Attending: Emergency Medicine | Admitting: Emergency Medicine

## 2016-03-31 ENCOUNTER — Ambulatory Visit: Payer: Medicaid Other | Admitting: Family Medicine

## 2016-03-31 ENCOUNTER — Encounter (HOSPITAL_COMMUNITY): Payer: Self-pay

## 2016-03-31 ENCOUNTER — Emergency Department (HOSPITAL_COMMUNITY): Payer: Medicaid Other

## 2016-03-31 DIAGNOSIS — I159 Secondary hypertension, unspecified: Secondary | ICD-10-CM | POA: Diagnosis not present

## 2016-03-31 DIAGNOSIS — I10 Essential (primary) hypertension: Secondary | ICD-10-CM

## 2016-03-31 DIAGNOSIS — Z8673 Personal history of transient ischemic attack (TIA), and cerebral infarction without residual deficits: Secondary | ICD-10-CM | POA: Insufficient documentation

## 2016-03-31 DIAGNOSIS — G441 Vascular headache, not elsewhere classified: Secondary | ICD-10-CM

## 2016-03-31 DIAGNOSIS — Z87891 Personal history of nicotine dependence: Secondary | ICD-10-CM | POA: Diagnosis not present

## 2016-03-31 DIAGNOSIS — R519 Headache, unspecified: Secondary | ICD-10-CM

## 2016-03-31 DIAGNOSIS — Z79899 Other long term (current) drug therapy: Secondary | ICD-10-CM | POA: Diagnosis not present

## 2016-03-31 DIAGNOSIS — R51 Headache: Secondary | ICD-10-CM | POA: Insufficient documentation

## 2016-03-31 LAB — COMPREHENSIVE METABOLIC PANEL
ALT: 16 U/L (ref 14–54)
AST: 22 U/L (ref 15–41)
Albumin: 3.8 g/dL (ref 3.5–5.0)
Alkaline Phosphatase: 74 U/L (ref 38–126)
Anion gap: 7 (ref 5–15)
BUN: 13 mg/dL (ref 6–20)
CHLORIDE: 109 mmol/L (ref 101–111)
CO2: 24 mmol/L (ref 22–32)
CREATININE: 0.85 mg/dL (ref 0.44–1.00)
Calcium: 9.2 mg/dL (ref 8.9–10.3)
GFR calc Af Amer: >60 mL/min (ref >60)
GFR calc non Af Amer: >60 mL/min (ref >60)
Glucose, Bld: 92 mg/dL (ref 65–99)
Potassium: 3.7 mmol/L (ref 3.5–5.1)
SODIUM: 140 mmol/L (ref 135–145)
Total Bilirubin: 0.6 mg/dL (ref 0.3–1.2)
Total Protein: 7.3 g/dL (ref 6.5–8.1)

## 2016-03-31 LAB — CBC WITH DIFFERENTIAL/PLATELET
BASOS ABS: 0 10*3/uL (ref 0.0–0.1)
Basophils Relative: 0 %
EOS ABS: 0.2 10*3/uL (ref 0.0–0.7)
EOS PCT: 4 %
HCT: 39.2 % (ref 36.0–46.0)
Hemoglobin: 12.7 g/dL (ref 12.0–15.0)
LYMPHS PCT: 38 %
Lymphs Abs: 1.8 10*3/uL (ref 0.7–4.0)
MCH: 28.7 pg (ref 26.0–34.0)
MCHC: 32.4 g/dL (ref 30.0–36.0)
MCV: 88.5 fL (ref 78.0–100.0)
Monocytes Absolute: 0.4 10*3/uL (ref 0.1–1.0)
Monocytes Relative: 8 %
Neutro Abs: 2.3 10*3/uL (ref 1.7–7.7)
Neutrophils Relative %: 50 %
PLATELETS: 204 10*3/uL (ref 150–400)
RBC: 4.43 MIL/uL (ref 3.87–5.11)
RDW: 14 % (ref 11.5–15.5)
WBC: 4.7 10*3/uL (ref 4.0–10.5)

## 2016-03-31 LAB — URINALYSIS, ROUTINE W REFLEX MICROSCOPIC
Bilirubin Urine: NEGATIVE
GLUCOSE, UA: NEGATIVE mg/dL
HGB URINE DIPSTICK: NEGATIVE
Ketones, ur: NEGATIVE mg/dL
LEUKOCYTES UA: NEGATIVE
Nitrite: NEGATIVE
PH: 6.5 (ref 5.0–8.0)
Protein, ur: 100 mg/dL — AB
Specific Gravity, Urine: 1.024 (ref 1.005–1.030)

## 2016-03-31 LAB — URINE MICROSCOPIC-ADD ON

## 2016-03-31 LAB — I-STAT TROPONIN, ED: TROPONIN I, POC: 0.01 ng/mL (ref 0.00–0.08)

## 2016-03-31 MED ORDER — LISINOPRIL 20 MG PO TABS
20.0000 mg | ORAL_TABLET | Freq: Two times a day (BID) | ORAL | Status: DC
  Administered 2016-03-31: 20 mg via ORAL
  Filled 2016-03-31: qty 1

## 2016-03-31 MED ORDER — AMLODIPINE BESYLATE 5 MG PO TABS
5.0000 mg | ORAL_TABLET | Freq: Every day | ORAL | Status: DC
  Administered 2016-03-31: 5 mg via ORAL
  Filled 2016-03-31: qty 1

## 2016-03-31 MED ORDER — HYDROCHLOROTHIAZIDE 25 MG PO TABS: 25.0000 mg | ORAL_TABLET | Freq: Every day | ORAL | 2 refills | Status: DC

## 2016-03-31 MED ORDER — AMLODIPINE BESYLATE 10 MG PO TABS: 5.0000 mg | ORAL_TABLET | Freq: Every day | ORAL | 3 refills | Status: DC

## 2016-03-31 MED ORDER — HYDRALAZINE HCL 25 MG PO TABS: 50.0000 mg | ORAL_TABLET | Freq: Three times a day (TID) | ORAL | Status: DC

## 2016-03-31 MED ORDER — METOPROLOL TARTRATE 50 MG PO TABS: 50.0000 mg | ORAL_TABLET | Freq: Two times a day (BID) | ORAL | 3 refills | Status: DC

## 2016-03-31 MED ORDER — LISINOPRIL 20 MG PO TABS: 20.0000 mg | ORAL_TABLET | Freq: Two times a day (BID) | ORAL | 2 refills | Status: DC

## 2016-03-31 MED ORDER — CLOPIDOGREL BISULFATE 75 MG PO TABS
75.0000 mg | ORAL_TABLET | Freq: Every day | ORAL | Status: DC
  Administered 2016-03-31: 75 mg via ORAL
  Filled 2016-03-31: qty 1

## 2016-03-31 MED ORDER — HYDROCHLOROTHIAZIDE 25 MG PO TABS
25.0000 mg | ORAL_TABLET | Freq: Every day | ORAL | Status: DC
  Administered 2016-03-31: 25 mg via ORAL
  Filled 2016-03-31: qty 1

## 2016-03-31 MED ORDER — BUTALBITAL-APAP-CAFFEINE 50-325-40 MG PO TABS: 1.0000 | ORAL_TABLET | Freq: Four times a day (QID) | ORAL | 3 refills | Status: DC | PRN

## 2016-03-31 MED ORDER — HYDRALAZINE HCL 50 MG PO TABS: 50.0000 mg | ORAL_TABLET | Freq: Three times a day (TID) | ORAL | 2 refills | Status: DC

## 2016-03-31 MED ORDER — CLOPIDOGREL BISULFATE 75 MG PO TABS: 75.0000 mg | ORAL_TABLET | Freq: Every day | ORAL | 3 refills | Status: DC

## 2016-03-31 NOTE — ED Notes (Signed)
Papers and medications reviewed with patient. IV D/C, bus pass given

## 2016-03-31 NOTE — Discharge Instructions (Signed)
Take fioricet for your headache. Take all your blood pressure medications as prescribed. Follow up with family doctor

## 2016-03-31 NOTE — ED Triage Notes (Signed)
Headache with nausea and vomiting for the past few weeks with some weakness and dizziness. L sided weakness and numbness from previous CVA. A/Ox4. Out of medications for the past 2 months including BP meds

## 2016-03-31 NOTE — ED Provider Notes (Signed)
[facility] DEPT Provider Note   CSN: [id number] Arrival date & time: 03/31/16  1224     History   Chief Complaint Chief Complaint  Patient presents with  . Headache    HPI Saleah Angers is a 45 y.o. female.  HPI Sidny Ramadan is a 45 y.o. female with hx of headaches, HTN, CVA, presents to ED with complaint of elevated blood pressure and headaches. Patient states that her headache has been going on for last few weeks.  History of the same.  Reports generalized weakness.  Denies any nausea, vomiting, numbness or weakness in extremities, changes in vision.  Patient states she has blurred vision, but states it is at baseline, and she is overdue for ophthalmology appointment.  She states that she has left sided weakness from prior CVA.  She states she ran of medications 2 months ago and does not have an appointment until 4 days from today.  She reports she is undergoing a lot of stress.  Denies fever, chills. No urinary symptoms. No other complaints.   Past Medical History:  Diagnosis Date  . Headache   . Hyperlipidemia   . Hypertension   . Stroke [facility])     Patient Active Problem List   Diagnosis Date Noted  . OSA (obstructive sleep apnea) 09/03/2015  . Heart valve vegetation 09/03/2015  . Snoring 05/02/2015  . Essential hypertension 05/02/2015  . Tobacco use disorder 05/02/2015  . Headache 05/01/2015  . Dysphagia, post-stroke 02/20/2015  . Former smoker 01/24/2015  . Alteration of sensations, post-stroke 12/22/2014  . Cognitive deficit, post-stroke 11/23/2014  . Left hemiparesis ([place]) 10/23/2014  . Cardiomyopathy due to hypertension ([place]) 10/23/2014  . Chronic ischemic vertebrobasilar artery thalamic stroke   . Aortic valve vegetation   . Hypertensive heart disease   . Overweight (BMI 25.0-29.9)   . H/O noncompliance with medical treatment, presenting hazards to health   . Aortic regurgitation   . Cerebral infarction due to thrombosis of right middle cerebral  artery ([place])   . Hyperlipidemia   . Embolic stroke involving right middle cerebral artery ([place]) 10/06/2014    Past Surgical History:  Procedure Laterality Date  . CESAREAN SECTION    . GANGLION CYST EXCISION    . TEE WITHOUT CARDIOVERSION N/A 10/09/2014   Procedure: TRANSESOPHAGEAL ECHOCARDIOGRAM (TEE);  Surgeon: Sanda Klein, MD;  Location: [facility] ENDOSCOPY;  Service: Cardiovascular;  Laterality: N/A;  . TONSILLECTOMY      OB History    No data available       Home Medications    Prior to Admission medications   Medication Sig Start Date End Date Taking? Authorizing Provider  atorvastatin (LIPITOR) 10 MG tablet TAKE 1 TABLET BY MOUTH DAILY AT 6 PM 08/06/15  Yes Lance Bosch, NP  atorvastatin (LIPITOR) 10 MG tablet TAKE 1 TABLET BY MOUTH DAILY AT 6 PM 08/10/15  Yes Arnoldo Morale, MD  butalbital-acetaminophen-caffeine (FIORICET, ESGIC) 50-325-40 MG tablet Take 1 tablet by mouth every 6 (six) hours as needed for headache. 05/01/15  Yes Rosalin Hawking, MD  clopidogrel (PLAVIX) 75 MG tablet Take 1 tablet (75 mg total) by mouth daily. 02/21/15  Yes Lance Bosch, NP  gabapentin (NEURONTIN) 300 MG capsule Take 1 capsule (300 mg total) by mouth at bedtime. 02/20/15  Yes Charlett Blake, MD  hydrALAZINE (APRESOLINE) 50 MG tablet Take 1 tablet (50 mg total) by mouth 3 (three) times daily with meals. 02/21/15  Yes Lance Bosch, NP  hydrochlorothiazide (HYDRODIURIL) 25 MG tablet Take 1  tablet (25 mg total) by mouth daily. 08/10/15  Yes Arnoldo Morale, MD  lisinopril (PRINIVIL,ZESTRIL) 20 MG tablet Take 1 tablet (20 mg total) by mouth 2 (two) times daily. 08/10/15  Yes Arnoldo Morale, MD  metoprolol (LOPRESSOR) 50 MG tablet Take 1 tablet (50 mg total) by mouth 2 (two) times daily. 02/21/15  Yes Lance Bosch, NP  spironolactone (ALDACTONE) 50 MG tablet Take 1 tablet (50 mg total) by mouth daily. 08/10/15  Yes Arnoldo Morale, MD  amLODipine (NORVASC) 10 MG tablet Take 0.5 tablets (5 mg total) by mouth  daily. Patient not taking: Reported on 03/31/2016 08/10/15   Arnoldo Morale, MD  metroNIDAZOLE (FLAGYL) 500 MG tablet Take 1 tablet (500 mg total) by mouth 2 (two) times daily. Patient not taking: Reported on 03/31/2016 06/18/15   Lance Bosch, NP  traMADol (ULTRAM) 50 MG tablet Take 1 tablet (50 mg total) by mouth every 6 (six) hours as needed (Headache). Patient not taking: Reported on 03/31/2016 10/18/14   Lavon Paganini Angiulli, PA-C    Family History Family History  Problem Relation Age of Onset  . Cancer Mother   . Hypertension Mother   . Hypertension Sister     Social History Social History  Substance Use Topics  . Smoking status: Former Smoker    Packs/day: 0.25    Quit date: 11/01/2014  . Smokeless tobacco: Never Used  . Alcohol use No     Allergies   Review of patient's allergies indicates no known allergies.   Review of Systems Review of Systems  Constitutional: Negative for chills and fever.  Eyes: Negative for photophobia and visual disturbance.  Respiratory: Negative for cough, chest tightness and shortness of breath.   Cardiovascular: Negative for chest pain, palpitations and leg swelling.  Gastrointestinal: Negative for abdominal pain, diarrhea, nausea and vomiting.  Genitourinary: Negative for dysuria, flank pain, pelvic pain, vaginal bleeding, vaginal discharge and vaginal pain.  Musculoskeletal: Negative for arthralgias, myalgias, neck pain and neck stiffness.  Skin: Negative for rash.  Neurological: Positive for dizziness, weakness and headaches.  All other systems reviewed and are negative.    Physical Exam Updated Vital Signs BP 197/85 (BP Location: Left Arm)   Pulse 61   Temp 98.1 F (36.7 C) (Oral)   Resp 18   Ht 5\' 5"  (1.651 m)   Wt 80.7 kg   SpO2 100%   BMI 29.62 kg/m   Physical Exam  Constitutional: She is oriented to person, place, and time. She appears well-developed and well-nourished. No distress.  HENT:  Head: Normocephalic.  Eyes:  Conjunctivae are normal.  Neck: Neck supple.  Cardiovascular: Normal rate, regular rhythm and normal heart sounds.   Pulmonary/Chest: Effort normal and breath sounds normal. No respiratory distress. She has no wheezes. She has no rales.  Abdominal: Soft. Bowel sounds are normal. She exhibits no distension. There is no tenderness. There is no rebound.  Musculoskeletal: She exhibits no edema.  Neurological: She is alert and oriented to person, place, and time.  5/5 and equal upper and lower extremity strength bilaterally. Equal grip strength bilaterally. Normal finger to nose and heel to shin. No pronator drift. Patellar reflexes 2+. Gait is normal   Skin: Skin is warm and dry. Capillary refill takes less than 2 seconds.  Psychiatric: She has a normal mood and affect. Her behavior is normal.  Nursing note and vitals reviewed.    ED Treatments / Results  Labs (all labs ordered are listed, but only abnormal results are displayed)  Labs Reviewed  URINALYSIS, ROUTINE W REFLEX MICROSCOPIC (NOT AT [facility]) - Abnormal; Notable for the following:       Result Value   APPearance HAZY (*)    Protein, ur 100 (*)    All other components within normal limits  URINE MICROSCOPIC-ADD ON - Abnormal; Notable for the following:    Squamous Epithelial / LPF 6-30 (*)    Bacteria, UA FEW (*)    All other components within normal limits  CBC WITH DIFFERENTIAL/PLATELET  COMPREHENSIVE METABOLIC PANEL  I-STAT TROPOININ, ED    EKG  EKG Interpretation  Date/Time:  Monday March 31 2016 13:36:33 EDT Ventricular Rate:  54 PR Interval:    QRS Duration: 91 QT Interval:  420 QTC Calculation: 398 R Axis:   11 Text Interpretation:  Sinus rhythm Left ventricular hypertrophy Borderline T abnormalities, lateral leads No significant change since last tracing Confirmed by [facility] MD, JULIE ([id number]) on 03/31/2016 1:44:12 PM Also confirmed by Gilford Raid MD, JULIE ([id number]), editor [place] CT, Leda Gauze [phone])  on 03/31/2016  3:04:10 PM       Radiology Ct Head Wo Contrast  Result Date: 03/31/2016 CLINICAL DATA:  Headache of 2 weeks duration, worsening today with nausea up. EXAM: CT HEAD WITHOUT CONTRAST TECHNIQUE: Contiguous axial images were obtained from the base of the skull through the vertex without intravenous contrast. COMPARISON:  10/23/2014 and previous FINDINGS: Brain: Old infarction is noted within the right lateral thalamus/ posterior limb internal capsule, the right periventricular deep white matter an the left basal ganglia. No sign of acute infarction, mass lesion, hemorrhage, hydrocephalus or extra-axial collection. Vascular: There is atherosclerotic calcification of the major vessels at the base of the brain. Skull: Normal Sinuses/Orbits: Clear/normal Other: None significant IMPRESSION: Old small vessel infarctions affecting the right thalamus in the basal ganglia left more than right. No acute finding by CT. No intracranial hemorrhage. Electronically Signed   By: Nelson Chimes M.D.   On: 03/31/2016 15:13    Procedures Procedures (including critical care time)  Medications Ordered in ED Medications  amLODipine (NORVASC) tablet 5 mg (5 mg Oral Given 03/31/16 1405)  clopidogrel (PLAVIX) tablet 75 mg (75 mg Oral Given 03/31/16 1405)  hydrALAZINE (APRESOLINE) tablet 50 mg (not administered)  hydrochlorothiazide (HYDRODIURIL) tablet 25 mg (25 mg Oral Given 03/31/16 1405)  lisinopril (PRINIVIL,ZESTRIL) tablet 20 mg (not administered)     Initial Impression / Assessment and Plan / ED Course  I have reviewed the triage vital signs and the nursing notes.  Pertinent labs & imaging results that were available during my care of the patient were reviewed by me and considered in my medical decision making (see chart for details).  Clinical Course   Pt in ED with headache, generalized weakness, dizziness. Pt with elevated BP. States ran out of medications 2 wks ago. No neuro deficits on exam except for  left sided weakness from prior stroke. Will check labs, CT head, ecg.   3:35 PM Labs and CT negative. Pt received her doses of BP medications. She is in no acute distress. No focal neurological findings on exam. No concern for SAH, no acute onset of thunderclap headache. No associated symptoms. Headaches most likely from stressors and elevated BP. No evidence of end organ damage at this time. Will refill her BP medications. She has an apt in 4 days with pcp. Will have her follow up closely. Return precautions discussed.   Vitals:   03/31/16 1445 03/31/16 1544 03/31/16 1545 03/31/16 1550  BP: 185/78  [name])  211/78 181/76  Pulse: (!) 59 63 66 63  Resp: 12 18 12 19   Temp:      TempSrc:      SpO2: 100% 100% 100% 100%  Weight:      Height:         Final Clinical Impressions(s) / ED Diagnoses   Final diagnoses:  Nonintractable headache, unspecified chronicity pattern, unspecified headache type  Secondary hypertension    New Prescriptions Discharge Medication List as of 03/31/2016  3:30 PM       Jeannett Senior, PA-C 04/01/16 [id number]    Isla Pence, MD 04/01/16 [id number]

## 2016-04-04 ENCOUNTER — Ambulatory Visit: Payer: Medicaid Other | Attending: Internal Medicine | Admitting: Physician Assistant

## 2016-04-04 VITALS — BP 160/90 | HR 77 | Temp 98.2°F | Resp 20 | Wt 183.0 lb

## 2016-04-04 DIAGNOSIS — R51 Headache: Secondary | ICD-10-CM | POA: Insufficient documentation

## 2016-04-04 DIAGNOSIS — R519 Headache, unspecified: Secondary | ICD-10-CM

## 2016-04-04 DIAGNOSIS — Z8673 Personal history of transient ischemic attack (TIA), and cerebral infarction without residual deficits: Secondary | ICD-10-CM | POA: Diagnosis not present

## 2016-04-04 DIAGNOSIS — I10 Essential (primary) hypertension: Secondary | ICD-10-CM | POA: Insufficient documentation

## 2016-04-04 DIAGNOSIS — Z79899 Other long term (current) drug therapy: Secondary | ICD-10-CM | POA: Diagnosis not present

## 2016-04-04 MED ORDER — GABAPENTIN 300 MG PO CAPS: 300.0000 mg | ORAL_CAPSULE | Freq: Every day | ORAL | 2 refills | Status: DC

## 2016-04-04 MED ORDER — TRAMADOL HCL 50 MG PO TABS: 50.0000 mg | ORAL_TABLET | Freq: Four times a day (QID) | ORAL | 0 refills | Status: DC | PRN

## 2016-04-04 MED ORDER — SPIRONOLACTONE 50 MG PO TABS: 50.0000 mg | ORAL_TABLET | Freq: Every day | ORAL | 2 refills | Status: DC

## 2016-04-04 MED ORDER — ATORVASTATIN CALCIUM 10 MG PO TABS: ORAL_TABLET | ORAL | 2 refills | Status: DC

## 2016-04-04 MED FILL — BUTALB-ACETAMIN-CAFF 50-325: 50-325-40 | 3 days supply | Qty: 15 | Fill #0

## 2016-04-04 MED FILL — LISINOPRIL 20 MG TABLET: 20 | 30 days supply | Qty: 60 | Fill #0

## 2016-04-04 MED FILL — hydrALAZINE HCL 50 MG TABS: 50 | 30 days supply | Qty: 90 | Fill #0

## 2016-04-04 MED FILL — GABAPENTIN 300 MG CAPSULE: 300 | 30 days supply | Qty: 30 | Fill #0

## 2016-04-04 MED FILL — METOPROLOL TARTRATE 50 MG T: 50 | 30 days supply | Qty: 60 | Fill #0

## 2016-04-04 MED FILL — HYDROCHLOROTHIAZIDE 25 MG T: 25 | 30 days supply | Qty: 30 | Fill #0

## 2016-04-04 MED FILL — SPIRONOLACTONE 50 MG TABLET: 50 | 30 days supply | Qty: 30 | Fill #0

## 2016-04-04 MED FILL — ATORVASTATIN 10 MG TABLET: 10 | 30 days supply | Qty: 30 | Fill #0

## 2016-04-04 MED FILL — CLOPIDOGREL 75 MG TABLET: 75 | 30 days supply | Qty: 30 | Fill #0

## 2016-04-04 MED FILL — AMLODIPINE BESYLATE 10 MG T: 10 | 30 days supply | Qty: 15 | Fill #0

## 2016-04-04 NOTE — Patient Instructions (Signed)
1-800-Quit-now for smoking cessation   Smoking Cessation, Tips for Success If you are ready to quit smoking, congratulations! You have chosen to help yourself be healthier. Cigarettes bring nicotine, tar, carbon monoxide, and other irritants into your body. Your lungs, heart, and blood vessels will be able to work better without these poisons. There are many different ways to quit smoking. Nicotine gum, nicotine patches, a nicotine inhaler, or nicotine nasal spray can help with physical craving. Hypnosis, support groups, and medicines help break the habit of smoking. WHAT THINGS CAN I DO TO MAKE QUITTING EASIER?  Here are some tips to help you quit for good:  Pick a date when you will quit smoking completely. Tell all of your friends and family about your plan to quit on that date.  Do not try to slowly cut down on the number of cigarettes you are smoking. Pick a quit date and quit smoking completely starting on that day.  Throw away all cigarettes.   Clean and remove all ashtrays from your home, work, and car.  On a card, write down your reasons for quitting. Carry the card with you and read it when you get the urge to smoke.  Cleanse your body of nicotine. Drink enough water and fluids to keep your urine clear or pale yellow. Do this after quitting to flush the nicotine from your body.  Learn to predict your moods. Do not let a bad situation be your excuse to have a cigarette. Some situations in your life might tempt you into wanting a cigarette.  Never have "just one" cigarette. It leads to wanting another and another. Remind yourself of your decision to quit.  Change habits associated with smoking. If you smoked while driving or when feeling stressed, try other activities to replace smoking. Stand up when drinking your coffee. Brush your teeth after eating. Sit in a different chair when you read the paper. Avoid alcohol while trying to quit, and try to drink fewer caffeinated beverages.  Alcohol and caffeine may urge you to smoke.  Avoid foods and drinks that can trigger a desire to smoke, such as sugary or spicy foods and alcohol.  Ask people who smoke not to smoke around you.  Have something planned to do right after eating or having a cup of coffee. For example, plan to take a walk or exercise.  Try a relaxation exercise to calm you down and decrease your stress. Remember, you may be tense and nervous for the first 2 weeks after you quit, but this will pass.  Find new activities to keep your hands busy. Play with a pen, coin, or rubber band. Doodle or draw things on paper.  Brush your teeth right after eating. This will help cut down on the craving for the taste of tobacco after meals. You can also try mouthwash.   Use oral substitutes in place of cigarettes. Try using lemon drops, carrots, cinnamon sticks, or chewing gum. Keep them handy so they are available when you have the urge to smoke.  When you have the urge to smoke, try deep breathing.  Designate your home as a nonsmoking area.  If you are a heavy smoker, ask your health care provider about a prescription for nicotine chewing gum. It can ease your withdrawal from nicotine.  Reward yourself. Set aside the cigarette money you save and buy yourself something nice.  Look for support from others. Join a support group or smoking cessation program. Ask someone at home or at work  to help you with your plan to quit smoking.  Always ask yourself, "Do I need this cigarette or is this just a reflex?" Tell yourself, "Today, I choose not to smoke," or "I do not want to smoke." You are reminding yourself of your decision to quit.  Do not replace cigarette smoking with electronic cigarettes (commonly called e-cigarettes). The safety of e-cigarettes is unknown, and some may contain harmful chemicals.  If you relapse, do not give up! Plan ahead and think about what you will do the next time you get the urge to smoke. HOW  WILL I FEEL WHEN I QUIT SMOKING? You may have symptoms of withdrawal because your body is used to nicotine (the addictive substance in cigarettes). You may crave cigarettes, be irritable, feel very hungry, cough often, get headaches, or have difficulty concentrating. The withdrawal symptoms are only temporary. They are strongest when you first quit but will go away within 10-14 days. When withdrawal symptoms occur, stay in control. Think about your reasons for quitting. Remind yourself that these are signs that your body is healing and getting used to being without cigarettes. Remember that withdrawal symptoms are easier to treat than the major diseases that smoking can cause.  Even after the withdrawal is over, expect periodic urges to smoke. However, these cravings are generally short lived and will go away whether you smoke or not. Do not smoke! WHAT RESOURCES ARE AVAILABLE TO HELP ME QUIT SMOKING? Your health care provider can direct you to community resources or hospitals for support, which may include:  Group support.  Education.  Hypnosis.  Therapy.   This information is not intended to replace advice given to you by your health care provider. Make sure you discuss any questions you have with your health care provider.   Document Released: 02/15/2004 Document Revised: 06/09/2014 Document Reviewed: 11/04/2012 Elsevier Interactive Patient Education [employer].

## 2016-04-04 NOTE — Progress Notes (Signed)
F/u headaches  Refill BP meds, cholesterol med, gabapentin and Plavix. Uncontrollable crying episodes greater than six months.

## 2016-04-05 NOTE — Progress Notes (Signed)
Brandy Roberts, is a 45 y.o. female  [id number]  [id number]  DOB - 17-Jan-1971  Subjective:  Chief Complaint and HPI: Brandy Roberts is a 45 y.o. female here today for ED follow up visit after running out of all of her meds. PMH: headaches, HTN, CVA with residual L sided weakness, presented to ED with headaches and elevated BP.  Her headaches get better when she is on all her meds.  She still hasn't gotten her meds filled since getting new prescriptions in the ED.  She has not been seen in our office since  January and needs to be assigned to a new PCP in our office.  She is requesting a written Rx for a walker to help with walking.  She travels back and forth bt here and [place] to see family frequently.  Compliance with medical treatment has been challenging for her.  She denies SI/HI.  ED/Hospital notes reviewed.  EKG and enzymes without acute ischemia.  CBC,CMP WNL   ROS:   Constitutional:  No f/c, No night sweats, No unexplained weight loss. EENT:  No vision changes, No blurry vision, No hearing changes. No mouth, throat, or ear problems.  Respiratory: No cough, No SOB Cardiac: No CP, no palpitations GI:  No abd pain, No N/V/D. GU: No Urinary s/sx Musculoskeletal: No joint pain Neuro: + headache, no dizziness, no motor weakness.  Skin: No rash Endocrine:  No polydipsia. No polyuria.  Psych: Denies SI/HI  No problems updated.  ALLERGIES: No Known Allergies  PAST MEDICAL HISTORY: Past Medical History:  Diagnosis Date  . Headache   . Hyperlipidemia   . Hypertension   . Stroke [facility])     MEDICATIONS AT HOME: Prior to Admission medications   Medication Sig Start Date End Date Taking? Authorizing Provider  amLODipine (NORVASC) 10 MG tablet Take 0.5 tablets (5 mg total) by mouth daily. 03/31/16  Yes Tatyana Kirichenko, PA-C  atorvastatin (LIPITOR) 10 MG tablet TAKE 1 TABLET BY MOUTH DAILY AT 6 PM 08/06/15  Yes Lance Bosch, NP    butalbital-acetaminophen-caffeine (FIORICET, ESGIC) 50-325-40 MG tablet Take 1 tablet by mouth every 6 (six) hours as needed for headache. 03/31/16  Yes Tatyana Kirichenko, PA-C  clopidogrel (PLAVIX) 75 MG tablet Take 1 tablet (75 mg total) by mouth daily. 03/31/16  Yes Tatyana Kirichenko, PA-C  gabapentin (NEURONTIN) 300 MG capsule Take 1 capsule (300 mg total) by mouth at bedtime. 04/04/16  Yes Dionne Bucy McClung, PA-C  hydrALAZINE (APRESOLINE) 50 MG tablet Take 1 tablet (50 mg total) by mouth 3 (three) times daily with meals. 03/31/16  Yes Tatyana Kirichenko, PA-C  hydrochlorothiazide (HYDRODIURIL) 25 MG tablet Take 1 tablet (25 mg total) by mouth daily. 03/31/16  Yes Tatyana Kirichenko, PA-C  lisinopril (PRINIVIL,ZESTRIL) 20 MG tablet Take 1 tablet (20 mg total) by mouth 2 (two) times daily. 03/31/16  Yes Tatyana Kirichenko, PA-C  metoprolol (LOPRESSOR) 50 MG tablet Take 1 tablet (50 mg total) by mouth 2 (two) times daily. 03/31/16  Yes Tatyana Kirichenko, PA-C  spironolactone (ALDACTONE) 50 MG tablet Take 1 tablet (50 mg total) by mouth daily. 04/04/16  Yes Argentina Donovan, PA-C  traMADol (ULTRAM) 50 MG tablet Take 1 tablet (50 mg total) by mouth every 6 (six) hours as needed (Headache). 04/04/16  Yes Argentina Donovan, PA-C  atorvastatin (LIPITOR) 10 MG tablet TAKE 1 TABLET BY MOUTH DAILY AT 6 PM 04/04/16   Argentina Donovan, PA-C     Objective:  EXAM:   Vitals:  04/04/16 1407  BP: (!) 160/90  Pulse: 77  Resp: 20  Temp: 98.2 F (36.8 C)  TempSrc: Oral  SpO2: 97%  Weight: 183 lb (83 kg)    General appearance : A&OX3. NAD. Non-toxic-appearing HEENT: Atraumatic and Normocephalic.  PERRLA. EOM intact.  TM clear B. Mouth-MMM, post pharynx WNL w/o erythema, No PND. Neck: supple, no JVD. No cervical lymphadenopathy. No thyromegaly Chest/Lungs:  Breathing-non-labored, Good air entry bilaterally, breath sounds normal without rales, rhonchi, or wheezing  CVS: S1 S2 regular, no murmurs,  gallops, rubs  Extremities: Bilateral Lower Ext shows no edema, both legs are warm to touch with = pulse throughout Neurology:  CN II-XII grossly intact, Non focal.   Psych:  TP linear. J/I WNL. Normal speech. Appropriate eye contact and affect.  Skin:  No Rash  Data Review Lab Results  Component Value Date   HGBA1C 5.8 (H) 10/07/2014     Assessment & Plan   1. Nonintractable headache, unspecified chronicity pattern, unspecified headache type Should improve as BP meds are restarted.  All meds that she was currently on are filled  2. Essential hypertension.  H/O CVA, L hemiparesis, and noncompliance with medication regimen. Uncontrolled.  Resume meds. Patient have been counseled extensively about nutrition, medication compliance, and exercise  Return in about 4 weeks (around 05/02/2016) for reassign to PCP(was a patient of Valerie's);f/up BP.  The patient was given clear instructions to go to ER or return to medical center if symptoms don't improve, worsen or new problems develop. The patient verbalized understanding. The patient was told to call to get lab results if they haven't heard anything in the next week.     Freeman Caldron, PA-C [facility], [place]   04/05/2016, 4:57 PMPatient ID: Brandy Roberts, female   DOB: November 18, 1970, 45 y.o.   MRN: [id number]

## 2016-04-30 ENCOUNTER — Ambulatory Visit: Payer: Medicaid Other | Attending: Family Medicine | Admitting: Family Medicine

## 2016-04-30 ENCOUNTER — Encounter: Payer: Self-pay | Admitting: Family Medicine

## 2016-04-30 ENCOUNTER — Ambulatory Visit: Payer: Medicaid Other | Admitting: Internal Medicine

## 2016-04-30 VITALS — BP 120/64 | HR 62 | Temp 98.2°F | Resp 18 | Ht 65.0 in | Wt 185.6 lb

## 2016-04-30 DIAGNOSIS — K047 Periapical abscess without sinus: Secondary | ICD-10-CM | POA: Insufficient documentation

## 2016-04-30 DIAGNOSIS — I10 Essential (primary) hypertension: Secondary | ICD-10-CM

## 2016-04-30 DIAGNOSIS — Z79899 Other long term (current) drug therapy: Secondary | ICD-10-CM | POA: Diagnosis not present

## 2016-04-30 DIAGNOSIS — K0530 Chronic periodontitis, unspecified: Secondary | ICD-10-CM | POA: Diagnosis not present

## 2016-04-30 DIAGNOSIS — K0889 Other specified disorders of teeth and supporting structures: Secondary | ICD-10-CM

## 2016-04-30 MED ORDER — PENICILLIN V POTASSIUM 250 MG PO TABS: 250.0000 mg | ORAL_TABLET | Freq: Four times a day (QID) | ORAL | 0 refills | Status: AC

## 2016-04-30 MED ORDER — IBUPROFEN 800 MG PO TABS: 800.0000 mg | ORAL_TABLET | Freq: Three times a day (TID) | ORAL | 0 refills | Status: DC | PRN

## 2016-04-30 NOTE — Progress Notes (Signed)
Patient is here to Reestablish Care for BP  Patient has taken medication today. Patient has eaten today.  Patient denies pain at this time.  Patient declined the flu vaccine today.

## 2016-04-30 NOTE — Progress Notes (Signed)
Subjective:  Patient ID: [name], female    DOB: [birth year]  Age: 45 y.o. MRN: [id number]  CC: Establish Care   HPI Brandy Roberts comes to the office for HTN, medication refills. She reports being without her some her medications for several days. She denies any CP or SOB . She denies any extremity swelling. She also has c/o loose teeth and dental pain.  She denies any difficulty swallowing.   Outpatient Medications Prior to Visit  Medication Sig Dispense Refill  . amLODipine (NORVASC) 10 MG tablet Take 0.5 tablets (5 mg total) by mouth daily. 30 tablet 3  . atorvastatin (LIPITOR) 10 MG tablet TAKE 1 TABLET BY MOUTH DAILY AT 6 PM 30 tablet 2  . atorvastatin (LIPITOR) 10 MG tablet TAKE 1 TABLET BY MOUTH DAILY AT 6 PM 30 tablet 2  . butalbital-acetaminophen-caffeine (FIORICET, ESGIC) 50-325-40 MG tablet Take 1 tablet by mouth every 6 (six) hours as needed for headache. 15 tablet 3  . clopidogrel (PLAVIX) 75 MG tablet Take 1 tablet (75 mg total) by mouth daily. 30 tablet 3  . gabapentin (NEURONTIN) 300 MG capsule Take 1 capsule (300 mg total) by mouth at bedtime. 30 capsule 2  . hydrALAZINE (APRESOLINE) 50 MG tablet Take 1 tablet (50 mg total) by mouth 3 (three) times daily with meals. 90 tablet 2  . hydrochlorothiazide (HYDRODIURIL) 25 MG tablet Take 1 tablet (25 mg total) by mouth daily. 30 tablet 2  . lisinopril (PRINIVIL,ZESTRIL) 20 MG tablet Take 1 tablet (20 mg total) by mouth 2 (two) times daily. 60 tablet 2  . metoprolol (LOPRESSOR) 50 MG tablet Take 1 tablet (50 mg total) by mouth 2 (two) times daily. 60 tablet 3  . spironolactone (ALDACTONE) 50 MG tablet Take 1 tablet (50 mg total) by mouth daily. 30 tablet 2  . traMADol (ULTRAM) 50 MG tablet Take 1 tablet (50 mg total) by mouth every 6 (six) hours as needed (Headache). 30 tablet 0   No facility-administered medications prior to visit.     ROS Review of Systems  HENT: Positive for dental problem. Negative for  facial swelling and trouble swallowing. Drooling: Loose, painful teeth with swelling.   Respiratory: Negative.   Cardiovascular: Negative.     Objective:  BP 120/64 (BP Location: Right Arm, Patient Position: Sitting, Cuff Size: Normal)   Pulse 62   Temp 98.2 F (36.8 C) (Oral)   Resp 18   Ht 5\' 5"  (1.651 m)   Wt 185 lb 9.6 oz (84.2 kg)   LMP 03/19/2016   SpO2 99%   BMI 30.89 kg/m   BP/Weight 04/30/2016 04/04/2016 [id number]  Systolic BP 123456 [id number]  Diastolic BP 64 90 76  Wt. (Lbs) 185.6 183 178  BMI 30.89 30.45 29.62  Some encounter information is confidential and restricted. Go to Review Flowsheets activity to see all data.    Physical Exam  Constitutional: She appears well-developed and well-nourished.  HENT:  Right Ear: Hearing, tympanic membrane, external ear and ear canal normal.  Left Ear: Hearing, tympanic membrane, external ear and ear canal normal.  Nose: Nose normal.  Mouth/Throat: Uvula is midline, oropharynx is clear and moist and mucous membranes are normal. Abnormal dentition (Upper and lower incisors very loose. Gingiva are receding, red, and swollen.Dental decay present.). No uvula swelling. No oropharyngeal exudate or tonsillar abscesses.  Cardiovascular: Normal rate, regular rhythm and normal heart sounds.   Pulses:      Radial pulses are 2+ on the right side, and 2+  on the left side.       Dorsalis pedis pulses are 2+ on the right side, and 2+ on the left side.  No pedal edema present.  Pulmonary/Chest: Effort normal and breath sounds normal.     Assessment & Plan:   1. Dental abscess - Ambulatory referral to Dentistry - penicillin v potassium (VEETID) 250 MG tablet; Take 1 tablet (250 mg total) by mouth 4 (four) times daily.  Dispense: 20 tablet; Refill: 0  2. Periodontitis - Ambulatory referral to Dentistry  3. Pain, dental - ibuprofen (ADVIL,MOTRIN) 800 MG tablet; Take 1 tablet (800 mg total) by mouth every 8 (eight) hours as needed.   Dispense: 90 tablet; Refill: 0  4. Essential hypertension -Patient requested refills on medications. It was noted she had refills still available which was confirmed with pharmacy.  Meds ordered this encounter  Medications  . ibuprofen (ADVIL,MOTRIN) 800 MG tablet    Sig: Take 1 tablet (800 mg total) by mouth every 8 (eight) hours as needed.    Dispense:  90 tablet    Refill:  0    Order Specific Question:   Supervising Provider    Answer:   Tresa Garter [id number]  . penicillin v potassium (VEETID) 250 MG tablet    Sig: Take 1 tablet (250 mg total) by mouth 4 (four) times daily.    Dispense:  20 tablet    Refill:  0    Order Specific Question:   Supervising Provider    Answer:   Tresa Garter [id number]    Follow-up: 3 months  [name]

## 2016-04-30 NOTE — Patient Instructions (Signed)
Hypertension Hypertension, commonly called high blood pressure, is when the force of blood pumping through your arteries is too strong. Your arteries are the blood vessels that carry blood from your heart throughout your body. A blood pressure reading consists of a higher number over a lower number, such as 110/72. The higher number (systolic) is the pressure inside your arteries when your heart pumps. The lower number (diastolic) is the pressure inside your arteries when your heart relaxes. Ideally you want your blood pressure below 120/80. Hypertension forces your heart to work harder to pump blood. Your arteries may become narrow or stiff. Having untreated or uncontrolled hypertension can cause heart attack, stroke, kidney disease, and other problems. What increases the risk? Some risk factors for high blood pressure are controllable. Others are not. Risk factors you cannot control include:  Race. You may be at higher risk if you are African American.  Age. Risk increases with age.  Gender. Men are at higher risk than women before age 45 years. After age 65, women are at higher risk than men. Risk factors you can control include:  Not getting enough exercise or physical activity.  Being overweight.  Getting too much fat, sugar, calories, or salt in your diet.  Drinking too much alcohol. What are the signs or symptoms? Hypertension does not usually cause signs or symptoms. Extremely high blood pressure (hypertensive crisis) may cause headache, anxiety, shortness of breath, and nosebleed. How is this diagnosed? To check if you have hypertension, your health care provider will measure your blood pressure while you are seated, with your arm held at the level of your heart. It should be measured at least twice using the same arm. Certain conditions can cause a difference in blood pressure between your right and left arms. A blood pressure reading that is higher than normal on one occasion does  not mean that you need treatment. If it is not clear whether you have high blood pressure, you may be asked to return on a different day to have your blood pressure checked again. Or, you may be asked to monitor your blood pressure at home for 1 or more weeks. How is this treated? Treating high blood pressure includes making lifestyle changes and possibly taking medicine. Living a healthy lifestyle can help lower high blood pressure. You may need to change some of your habits. Lifestyle changes may include:  Following the DASH diet. This diet is high in fruits, vegetables, and whole grains. It is low in salt, red meat, and added sugars.  Keep your sodium intake below 2,300 mg per day.  Getting at least 30-45 minutes of aerobic exercise at least 4 times per week.  Losing weight if necessary.  Not smoking.  Limiting alcoholic beverages.  Learning ways to reduce stress. Your health care provider may prescribe medicine if lifestyle changes are not enough to get your blood pressure under control, and if one of the following is true:  You are 18-59 years of age and your systolic blood pressure is above 140.  You are 60 years of age or older, and your systolic blood pressure is above 150.  Your diastolic blood pressure is above 90.  You have diabetes, and your systolic blood pressure is over 140 or your diastolic blood pressure is over 90.  You have kidney disease and your blood pressure is above 140/90.  You have heart disease and your blood pressure is above 140/90. Your personal target blood pressure may vary depending on your medical   conditions, your age, and other factors. Follow these instructions at home:  Have your blood pressure rechecked as directed by your health care provider.  Take medicines only as directed by your health care provider. Follow the directions carefully. Blood pressure medicines must be taken as prescribed. The medicine does not work as well when you skip  doses. Skipping doses also puts you at risk for problems.  Do not smoke.  Monitor your blood pressure at home as directed by your health care provider. Contact a health care provider if:  You think you are having a reaction to medicines taken.  You have recurrent headaches or feel dizzy.  You have swelling in your ankles.  You have trouble with your vision. Get help right away if:  You develop a severe headache or confusion.  You have unusual weakness, numbness, or feel faint.  You have severe chest or abdominal pain.  You vomit repeatedly.  You have trouble breathing. This information is not intended to replace advice given to you by your health care provider. Make sure you discuss any questions you have with your health care provider. Document Released: 05/19/2005 Document Revised: 10/25/2015 Document Reviewed: 03/11/2013 Elsevier Interactive Patient Education  2017 [employer] Periodontal Disease Periodontal disease, also called gum disease or gingivitis, is inflammation, infection, or both that affects the tissue that surrounds and supports the teeth (periodontal tissue). Periodontal tissue includes the gums, the tissues (ligaments) that hold the teeth in place, and the tooth sockets (alveolar bones). Periodontal disease can affect tissue around one tooth or many teeth. If this condition is not treated, it can cause you to lose a tooth. What are the causes? This condition is usually caused by plaque. Plaque contains harmful bacteria that can cause gums to become swollen and infected. If periodontal disease gets worse (progresses), it can also damage other supporting tissues. Plaque can develop due to poor oral care, such as not brushing teeth enough, not visiting the dentist regularly, or eating and drinking too many sugary foods and beverages. What increases the risk? This condition is more likely to develop in people who:  Smoke.  Use tobacco.  Clench or grind their  teeth.  Abuse substances.  Are going through the hormonal changes of puberty, menopause, or pregnancy.  Are under stress.  Are taking certain medicines, such as steroids, antiseizure medicines, or medicines to treat cancer.  Have diabetes.  Have poor nutrition.  Have a disease that interferes with the body's disease-fighting system (immune system).  Have a family history of periodontal disease. What are the signs or symptoms? Symptoms of this condition include:  Red or swollen gums.  Bad breath that does not go away.  Gums that have pulled away from the teeth.  Gums that bleed easily.  Teeth that are loose or separating.  Pain when chewing.  Changes in the way your teeth fit together.  Sensitive teeth. How is this diagnosed? This condition is diagnosed with an exam of the tissue around your teeth. Your health care provider may also take an X-ray of your teeth and ask about your medical history. How is this treated? Treatment for this condition depends on the extent of the disease, which is determined by a dental exam. Treatment may include:  Brushing and flossing regularly.  A deep dental cleaning that involves scraping the buildup of plaque and tartar from below the gum line (scaling and root planing). This may be needed if the disease progresses.  Antibiotic medicines. These may be taken  by mouth (orally) or as a rinse.  Surgery, in some severe cases. This may involve a surgery to lift up the gums and remove tartar deposits or to reduce a pocket of periodontal disease (flap surgery). Surgery might also involve procedures that replace damaged areas and help healthy bone and tissue to grow (bone and tissue grafting). Follow these instructions at home:  Practice good oral hygiene:  Brush your teeth two times a day with a soft toothbrush.  Floss between your teeth every day.  Get regular dental exams.  If you were prescribed antibiotic medicine or a rinse, take  or use it as told by your health care provider. Do not stop taking or using the antibiotic even if your condition improves.  Take over-the-counter and prescription medicines only as told by your health care provider.  Do not use any products that contain nicotine or tobacco, such as cigarettes and e-cigarettes. If you need help quitting, ask your health care provider.  Eat a well-balanced diet that includes plenty of vegetables, fruits, whole grains, low-fat dairy products, and lean protein.  Do not eat a lot of foods that are high in solid fats, added sugars, or salt.  Avoid beverages that contain a lot of sugar. Contact a health care provider if:  Your symptoms do not improve. Get help right away if:  You have swelling in your face, neck, or jaw.  You have severe pain that does not get better with medicine.  You have a fever. Summary  Periodontal disease, also called gum disease or gingivitis, is inflammation, infection, or both that affects the tissue that surrounds and supports the teeth (periodontal tissue).  Practice good oral hygiene. Brush your teeth two times a day with a soft toothbrush. Floss between your teeth every day.  Do not use any products that contain nicotine or tobacco, such as cigarettes and e-cigarettes. If you need help quitting, ask your health care provider.  If you were prescribed antibiotic medicine or a rinse, take or use it as told by your health care provider. Do not stop taking or using the antibiotic even if your condition improves. This information is not intended to replace advice given to you by your health care provider. Make sure you discuss any questions you have with your health care provider. Document Released: 05/22/2003 Document Revised: 02/29/2016 Document Reviewed: 02/29/2016 Elsevier Interactive Patient Education  2017 [place].  Follow up in 3 months or sooner as needed.

## 2016-05-07 MED FILL — HYDROCHLOROTHIAZIDE 25 MG T: 25 | 30 days supply | Qty: 30 | Fill #1

## 2016-05-07 MED FILL — PENICILLIN VK 250 MG TABLET: 250 | 10 days supply | Qty: 20 | Fill #0

## 2016-05-07 MED FILL — GABAPENTIN 300 MG CAPSULE: 300 | 30 days supply | Qty: 30 | Fill #1

## 2016-05-07 MED FILL — IBUPROFEN 800 MG TABLET: 800 | 30 days supply | Qty: 90 | Fill #0

## 2016-05-07 MED FILL — ATORVASTATIN 10 MG TABLET: 10 | 30 days supply | Qty: 30 | Fill #1

## 2016-05-07 MED FILL — CLOPIDOGREL 75 MG TABLET: 75 | 30 days supply | Qty: 30 | Fill #1

## 2016-05-07 MED FILL — AMLODIPINE BESYLATE 10 MG T: 10 | 30 days supply | Qty: 15 | Fill #1

## 2016-05-07 MED FILL — LISINOPRIL 20 MG TABLET: 20 | 30 days supply | Qty: 60 | Fill #1

## 2016-05-07 MED FILL — hydrALAZINE HCL 50 MG TABS: 50 | 30 days supply | Qty: 90 | Fill #1

## 2016-05-07 MED FILL — SPIRONOLACTONE 50 MG TABLET: 50 | 30 days supply | Qty: 30 | Fill #1

## 2016-05-07 MED FILL — METOPROLOL TARTRATE 50 MG T: 50 | 30 days supply | Qty: 60 | Fill #1

## 2016-05-14 ENCOUNTER — Ambulatory Visit: Payer: Medicaid Other | Admitting: Internal Medicine

## 2016-06-12 MED FILL — HYDROCHLOROTHIAZIDE 25 MG T: 25 | 30 days supply | Qty: 30 | Fill #2

## 2016-06-12 MED FILL — AMLODIPINE BESYLATE 10 MG T: 10 | 30 days supply | Qty: 15 | Fill #2

## 2016-06-12 MED FILL — hydrALAZINE HCL 50 MG TABS: 50 | 30 days supply | Qty: 90 | Fill #2

## 2016-06-12 MED FILL — CLOPIDOGREL 75 MG TABLET: 75 | 30 days supply | Qty: 30 | Fill #2

## 2016-06-12 MED FILL — LISINOPRIL 20 MG TABLET: 20 | 30 days supply | Qty: 60 | Fill #2

## 2016-06-12 MED FILL — SPIRONOLACTONE 50 MG TABLET: 50 | 30 days supply | Qty: 30 | Fill #2

## 2016-06-12 MED FILL — METOPROLOL TARTRATE 50 MG T: 50 | 30 days supply | Qty: 60 | Fill #2

## 2016-06-12 MED FILL — ATORVASTATIN 10 MG TABLET: 10 | 30 days supply | Qty: 30 | Fill #2

## 2016-06-24 ENCOUNTER — Emergency Department (HOSPITAL_COMMUNITY)
Admission: EM | Admit: 2016-06-24 | Discharge: 2016-06-24 | Disposition: A | Discharge status: Home / Self Care | Payer: Medicaid Other | Attending: Dermatology | Admitting: Dermatology

## 2016-06-24 ENCOUNTER — Encounter (HOSPITAL_COMMUNITY): Payer: Self-pay | Admitting: Emergency Medicine

## 2016-06-24 DIAGNOSIS — Z5321 Procedure and treatment not carried out due to patient leaving prior to being seen by health care provider: Secondary | ICD-10-CM | POA: Insufficient documentation

## 2016-06-24 DIAGNOSIS — I10 Essential (primary) hypertension: Secondary | ICD-10-CM | POA: Insufficient documentation

## 2016-06-24 NOTE — ED Triage Notes (Signed)
The patient said she has been out of her blood pressure medication since Friday.  She has been feeling like her blood pressure is elevated because she has a headache.  She rates her headache 8/10.

## 2016-06-24 NOTE — ED Notes (Signed)
Pt stated that she was leaving because she has to go and get her family. Pt IV has been removed and pt has walked out at this time.

## 2016-07-02 ENCOUNTER — Ambulatory Visit: Payer: Medicaid Other | Attending: Family Medicine | Admitting: Family Medicine

## 2016-07-02 ENCOUNTER — Encounter: Payer: Self-pay | Admitting: Family Medicine

## 2016-07-02 VITALS — BP 113/65 | HR 58 | Temp 98.2°F | Resp 18 | Ht 66.0 in | Wt 189.0 lb

## 2016-07-02 DIAGNOSIS — Z79899 Other long term (current) drug therapy: Secondary | ICD-10-CM | POA: Insufficient documentation

## 2016-07-02 DIAGNOSIS — Z8673 Personal history of transient ischemic attack (TIA), and cerebral infarction without residual deficits: Secondary | ICD-10-CM | POA: Insufficient documentation

## 2016-07-02 DIAGNOSIS — T148XXA Other injury of unspecified body region, initial encounter: Secondary | ICD-10-CM | POA: Diagnosis not present

## 2016-07-02 DIAGNOSIS — R21 Rash and other nonspecific skin eruption: Secondary | ICD-10-CM | POA: Insufficient documentation

## 2016-07-02 DIAGNOSIS — X58XXXA Exposure to other specified factors, initial encounter: Secondary | ICD-10-CM | POA: Diagnosis not present

## 2016-07-02 DIAGNOSIS — G43009 Migraine without aura, not intractable, without status migrainosus: Secondary | ICD-10-CM | POA: Diagnosis present

## 2016-07-02 MED ORDER — BACIT-POLY-NEO HC 1 % EX OINT: 1.0000 "application " | TOPICAL_OINTMENT | Freq: Two times a day (BID) | CUTANEOUS | 0 refills | Status: DC

## 2016-07-02 MED ORDER — BUTALBITAL-APAP-CAFF-COD 50-325-40-30 MG PO CAPS: 1.0000 | ORAL_CAPSULE | Freq: Four times a day (QID) | ORAL | 0 refills | Status: DC | PRN

## 2016-07-02 NOTE — Progress Notes (Signed)
Patient has not eaten today  Patient has not taking her meds just her ibuprofen   Patient has not eaten today  Patient complains a headache that comes and go since her stroke   Patient needs a referral to the neurology

## 2016-07-02 NOTE — Patient Instructions (Signed)

## 2016-07-02 NOTE — Progress Notes (Signed)
Subjective:  Patient ID: Brandy Roberts, female    DOB: 05-Jan-1971  Age: 46 y.o. MRN: [id number]  CC: Establish Care   HPI Brandy Roberts presents for c/o headache and skin rash. History of migraine headaches. Reports recent ED visit for headache last week but left without being seen due to wait time. Denies any history of head injury, paralysis, gait abnormality Reports symptoms unrelieved by ibuprofen. Reports symptom relieved in the past with Fioricet . Pt. States " I forgot I had that medication".  Also reports symptoms improvement with rest. She also c/o 1 week history of skin discoloration to right lateral lower leg. No history of injury or bites. Denies any bruising or clotting disorder, coolness, or swelling of the extremity.  Outpatient Medications Prior to Visit  Medication Sig Dispense Refill  . amLODipine (NORVASC) 10 MG tablet Take 0.5 tablets (5 mg total) by mouth daily. 30 tablet 3  . atorvastatin (LIPITOR) 10 MG tablet TAKE 1 TABLET BY MOUTH DAILY AT 6 PM 30 tablet 2  . atorvastatin (LIPITOR) 10 MG tablet TAKE 1 TABLET BY MOUTH DAILY AT 6 PM 30 tablet 2  . butalbital-acetaminophen-caffeine (FIORICET, ESGIC) 50-325-40 MG tablet Take 1 tablet by mouth every 6 (six) hours as needed for headache. 15 tablet 3  . clopidogrel (PLAVIX) 75 MG tablet Take 1 tablet (75 mg total) by mouth daily. 30 tablet 3  . gabapentin (NEURONTIN) 300 MG capsule Take 1 capsule (300 mg total) by mouth at bedtime. 30 capsule 2  . hydrALAZINE (APRESOLINE) 50 MG tablet Take 1 tablet (50 mg total) by mouth 3 (three) times daily with meals. 90 tablet 2  . hydrochlorothiazide (HYDRODIURIL) 25 MG tablet Take 1 tablet (25 mg total) by mouth daily. 30 tablet 2  . ibuprofen (ADVIL,MOTRIN) 800 MG tablet Take 1 tablet (800 mg total) by mouth every 8 (eight) hours as needed. 90 tablet 0  . lisinopril (PRINIVIL,ZESTRIL) 20 MG tablet Take 1 tablet (20 mg total) by mouth 2 (two) times daily. 60 tablet 2  .  metoprolol (LOPRESSOR) 50 MG tablet Take 1 tablet (50 mg total) by mouth 2 (two) times daily. 60 tablet 3  . spironolactone (ALDACTONE) 50 MG tablet Take 1 tablet (50 mg total) by mouth daily. 30 tablet 2  . traMADol (ULTRAM) 50 MG tablet Take 1 tablet (50 mg total) by mouth every 6 (six) hours as needed (Headache). (Patient not taking: Reported on 07/02/2016) 30 tablet 0   No facility-administered medications prior to visit.     ROS Review of Systems  Constitutional: Negative.   Respiratory: Negative.   Cardiovascular: Negative.   Gastrointestinal: Negative.   Skin: Negative.   Neurological: Positive for headaches (migraines).    Objective:  BP 113/65 (BP Location: Right Arm, Patient Position: Sitting, Cuff Size: Normal)   Pulse (!) 58   Temp 98.2 F (36.8 C) (Oral)   Resp 18   Ht 5\' 6"  (1.676 m)   Wt 189 lb (85.7 kg)   SpO2 97%   BMI 30.51 kg/m   BP/Weight 07/02/2016 06/24/2016 [id number]  Systolic BP 123456 [id number] 123456  Diastolic BP 65 80 64  Wt. (Lbs) 189 185 185.6  BMI 30.51 29.86 30.89  Some encounter information is confidential and restricted. Go to Review Flowsheets activity to see all data.    Physical Exam  Constitutional: She is oriented to person, place, and time.  Eyes: Conjunctivae and EOM are normal. Pupils are equal, round, and reactive to light.  Cardiovascular: Normal rate, regular  rhythm, normal heart sounds and intact distal pulses.   Pulmonary/Chest: Effort normal and breath sounds normal.  Abdominal: Soft. Bowel sounds are normal.  Neurological: She is alert and oriented to person, place, and time. She has normal reflexes. No cranial nerve deficit. Coordination normal.  Skin: Skin is warm and dry. Abrasion noted. Lesion: right lateral leg , brown discoloration present. No erythema.  Nursing note and vitals reviewed.  Assessment & Plan:   Problem List Items Addressed This Visit    None    Visit Diagnoses    Migraine without aura and without status  migrainosus, not intractable    -  Primary   Relevant Medications   butalbital-acetaminophen-caffeine (FIORICET/CODEINE) 50-325-40-30 MG capsule   - PMH of CVA. History of migraines. Reports missing neurology appointment in the past.   Other Relevant Orders   Ambulatory referral to Neurology   Abrasion       Relevant Medications   bacitracin-neomycin-polymyxin-hydrocortisone (CORTISPORIN) 1 % ointment      Meds ordered this encounter  Medications  . butalbital-acetaminophen-caffeine (FIORICET/CODEINE) 50-325-40-30 MG capsule    Sig: Take 1 capsule by mouth every 6 (six) hours as needed for migraine.    Dispense:  30 capsule    Refill:  0    Order Specific Question:   Supervising Provider    Answer:   Tresa Garter [id number]  . bacitracin-neomycin-polymyxin-hydrocortisone (CORTISPORIN) 1 % ointment    Sig: Apply 1 application topically 2 (two) times daily.    Dispense:  15 g    Refill:  0    Order Specific Question:   Supervising Provider    Answer:   Tresa Garter [id number]    Follow-up: Return if symptoms worsen or fail to improve.   [name]

## 2016-07-09 ENCOUNTER — Telehealth: Payer: Self-pay | Admitting: Family Medicine

## 2016-07-09 NOTE — Telephone Encounter (Signed)
Janett Billow from [facility] called the office to speak with PCP regarding patient and medication. Pt was seen at the (dental) office on 2/5 for extraction and 5 days before appointment patient stopped taking Plavix for procedure. Pt is scheduled to have another extraction on 2/13 and will need to stop taking medication again tomorrow. Janett Billow would like to know if this is safe. She would like a letter in writing stating that this is ok (to have patient on and off Plavix for back to back dental extractions). Please follow up.   Thank you.

## 2016-07-15 MED FILL — SPIRONOLACTONE 50 MG TABLET: 50 | 30 days supply | Qty: 30 | Fill #0

## 2016-07-22 MED FILL — ATORVASTATIN 10 MG TABLET: 10 | 30 days supply | Qty: 30 | Fill #1

## 2016-07-22 MED FILL — CLOPIDOGREL 75 MG TABLET: 75 | 30 days supply | Qty: 30 | Fill #3

## 2016-07-22 MED FILL — METOPROLOL TARTRATE 50 MG T: 50 | 30 days supply | Qty: 60 | Fill #3

## 2016-07-22 MED FILL — HYDROCHLOROTHIAZIDE 25 MG T: 25 | 30 days supply | Qty: 30 | Fill #1

## 2016-07-24 ENCOUNTER — Ambulatory Visit: Payer: Medicaid Other | Attending: Family Medicine | Admitting: Family Medicine

## 2016-07-24 ENCOUNTER — Encounter: Payer: Self-pay | Admitting: Family Medicine

## 2016-07-24 VITALS — BP 128/72 | HR 77 | Temp 98.1°F | Resp 18 | Ht 66.0 in | Wt 185.0 lb

## 2016-07-24 DIAGNOSIS — Z7902 Long term (current) use of antithrombotics/antiplatelets: Secondary | ICD-10-CM | POA: Insufficient documentation

## 2016-07-24 DIAGNOSIS — N939 Abnormal uterine and vaginal bleeding, unspecified: Secondary | ICD-10-CM | POA: Diagnosis not present

## 2016-07-24 DIAGNOSIS — Z79899 Other long term (current) drug therapy: Secondary | ICD-10-CM | POA: Diagnosis not present

## 2016-07-24 MED FILL — LISINOPRIL 20 MG TABLET: 20 | 30 days supply | Qty: 60 | Fill #0

## 2016-07-24 MED FILL — GABAPENTIN 300 MG CAPSULE: 300 | 30 days supply | Qty: 30 | Fill #2

## 2016-07-24 NOTE — Patient Instructions (Addendum)
You will be referred to gynecology.   Dysfunctional Uterine Bleeding Introduction Dysfunctional uterine bleeding is abnormal bleeding from the uterus. Dysfunctional uterine bleeding includes:  A period that comes earlier or later than usual.  A period that is lighter, heavier, or has blood clots.  Bleeding between periods.  Skipping one or more periods.  Bleeding after sexual intercourse.  Bleeding after menopause. Follow these instructions at home: Pay attention to any changes in your symptoms. Follow these instructions to help with your condition: Eating and drinking  Eat well-balanced meals. Include foods that are high in iron, such as liver, meat, shellfish, green leafy vegetables, and eggs.  If you become constipated:  Drink plenty of water.  Eat fruits and vegetables that are high in water and fiber, such as spinach, carrots, raspberries, apples, and mango. Medicines  Take over-the-counter and prescription medicines only as told by your health care provider.  Do not change medicines without talking with your health care provider.  Aspirin or medicines that contain aspirin may make the bleeding worse. Do not take those medicines:  During the week before your period.  During your period.  If you were prescribed iron pills, take them as told by your health care provider. Iron pills help to replace iron that your body loses because of this condition. Activity  If you need to change your sanitary pad or tampon more than one time every 2 hours:  Lie in bed with your feet raised (elevated).  Place a cold pack on your lower abdomen.  Rest as much as possible until the bleeding stops or slows down.  Do not try to lose weight until the bleeding has stopped and your blood iron level is back to normal. Other Instructions  For two months, write down:  When your period starts.  When your period ends.  When any abnormal bleeding occurs.  What problems you  notice.  Keep all follow up visits as told by your health care provider. This is important. Contact a health care provider if:  You get light-headed or weak.  You have nausea and vomiting.  You cannot eat or drink without vomiting.  You feel dizzy or have diarrhea while you are taking medicines.  You are taking birth control pills or hormones, and you want to change them or stop taking them. Get help right away if:  You develop a fever or chills.  You need to change your sanitary pad or tampon more than one time per hour.  Your bleeding becomes heavier, or your flow contains clots more often.  You develop pain in your abdomen.  You lose consciousness.  You develop a rash. This information is not intended to replace advice given to you by your health care provider. Make sure you discuss any questions you have with your health care provider. Document Released: 05/16/2000 Document Revised: 10/25/2015 Document Reviewed: 08/14/2014  2017 Elsevier Perimenopause Perimenopause is the time when your body begins to move into the menopause (no menstrual period for 12 straight months). It is a natural process. Perimenopause can begin 2-8 years before the menopause and usually lasts for 1 year after the menopause. During this time, your ovaries may or may not produce an egg. The ovaries vary in their production of estrogen and progesterone hormones each month. This can cause irregular menstrual periods, difficulty getting pregnant, vaginal bleeding between periods, and uncomfortable symptoms. CAUSES  Irregular production of the ovarian hormones, estrogen and progesterone, and not ovulating every month.  Other causes include:  Tumor of the pituitary gland in the brain.  Medical disease that affects the ovaries.  Radiation treatment.  Chemotherapy.  Unknown causes.  Heavy smoking and excessive alcohol intake can bring on perimenopause sooner. SIGNS AND SYMPTOMS   Hot  flashes.  Night sweats.  Irregular menstrual periods.  Decreased sex drive.  Vaginal dryness.  Headaches.  Mood swings.  Depression.  Memory problems.  Irritability.  Tiredness.  Weight gain.  Trouble getting pregnant.  The beginning of losing bone cells (osteoporosis).  The beginning of hardening of the arteries (atherosclerosis). DIAGNOSIS  Your health care provider will make a diagnosis by analyzing your age, menstrual history, and symptoms. He or she will do a physical exam and note any changes in your body, especially your female organs. Female hormone tests may or may not be helpful depending on the amount of female hormones you produce and when you produce them. However, other hormone tests may be helpful to rule out other problems. TREATMENT  In some cases, no treatment is needed. The decision on whether treatment is necessary during the perimenopause should be made by you and your health care provider based on how the symptoms are affecting you and your lifestyle. Various treatments are available, such as:  Treating individual symptoms with a specific medicine for that symptom.  Herbal medicines that can help specific symptoms.  Counseling.  Group therapy. HOME CARE INSTRUCTIONS   Keep track of your menstrual periods (when they occur, how heavy they are, how long between periods, and how long they last) as well as your symptoms and when they started.  Only take over-the-counter or prescription medicines as directed by your health care provider.  Sleep and rest.  Exercise.  Eat a diet that contains calcium (good for your bones) and soy (acts like the estrogen hormone).  Do not smoke.  Avoid alcoholic beverages.  Take vitamin supplements as recommended by your health care provider. Taking vitamin E may help in certain cases.  Take calcium and vitamin D supplements to help prevent bone loss.  Group therapy is sometimes helpful.  Acupuncture may  help in some cases. SEEK MEDICAL CARE IF:   You have questions about any symptoms you are having.  You need a referral to a specialist (gynecologist, psychiatrist, or psychologist). SEEK IMMEDIATE MEDICAL CARE IF:   You have vaginal bleeding.  Your period lasts longer than 8 days.  Your periods are recurring sooner than 21 days.  You have bleeding after intercourse.  You have severe depression.  You have pain when you urinate.  You have severe headaches.  You have vision problems. This information is not intended to replace advice given to you by your health care provider. Make sure you discuss any questions you have with your health care provider. Document Released: 06/26/2004 Document Revised: 06/09/2014 Document Reviewed: 12/16/2012 Elsevier Interactive Patient Education  2017 [employer].

## 2016-07-24 NOTE — Progress Notes (Signed)
Patient is here for vaginal bleeding  Patient stated that she started bleeding over 2 weeks now , she said that it stop the heavy bleeding this morning but still spotting  Patient stated that shes out on all her current medication  Patient has eaten today  Patient has not taking her current meds

## 2016-07-24 NOTE — Progress Notes (Signed)
Subjective:  Patient ID: Brandy Roberts, female    DOB: 1970/11/08  Age: 46 y.o. MRN: [id number]  CC: Establish Care   HPI [name] presents for   Abnormal uterine bleeding: 2 weeks of vaginal bleeding. First week vaginal bleeding, second week vaginal spotting. Reports almost 1 year without menstrual cycle. Denies any pelvic pain, history of fibroids, vaginal discharge, or use of oral contraceptives. History of Plavix use. Reports recent history of dental extractions.    Outpatient Medications Prior to Visit  Medication Sig Dispense Refill  . amLODipine (NORVASC) 10 MG tablet Take 0.5 tablets (5 mg total) by mouth daily. 30 tablet 3  . atorvastatin (LIPITOR) 10 MG tablet TAKE 1 TABLET BY MOUTH DAILY AT 6 PM 30 tablet 2  . atorvastatin (LIPITOR) 10 MG tablet TAKE 1 TABLET BY MOUTH DAILY AT 6 PM 30 tablet 2  . bacitracin-neomycin-polymyxin-hydrocortisone (CORTISPORIN) 1 % ointment Apply 1 application topically 2 (two) times daily. 15 g 0  . butalbital-acetaminophen-caffeine (FIORICET, ESGIC) 50-325-40 MG tablet Take 1 tablet by mouth every 6 (six) hours as needed for headache. 15 tablet 3  . butalbital-acetaminophen-caffeine (FIORICET/CODEINE) 50-325-40-30 MG capsule Take 1 capsule by mouth every 6 (six) hours as needed for migraine. 30 capsule 0  . clopidogrel (PLAVIX) 75 MG tablet Take 1 tablet (75 mg total) by mouth daily. 30 tablet 3  . gabapentin (NEURONTIN) 300 MG capsule Take 1 capsule (300 mg total) by mouth at bedtime. 30 capsule 2  . hydrochlorothiazide (HYDRODIURIL) 25 MG tablet Take 1 tablet (25 mg total) by mouth daily. 30 tablet 2  . ibuprofen (ADVIL,MOTRIN) 800 MG tablet Take 1 tablet (800 mg total) by mouth every 8 (eight) hours as needed. 90 tablet 0  . lisinopril (PRINIVIL,ZESTRIL) 20 MG tablet Take 1 tablet (20 mg total) by mouth 2 (two) times daily. 60 tablet 2  . metoprolol (LOPRESSOR) 50 MG tablet Take 1 tablet (50 mg total) by mouth 2 (two) times daily. 60  tablet 3  . spironolactone (ALDACTONE) 50 MG tablet Take 1 tablet (50 mg total) by mouth daily. 30 tablet 2  . traMADol (ULTRAM) 50 MG tablet Take 1 tablet (50 mg total) by mouth every 6 (six) hours as needed (Headache). 30 tablet 0  . hydrALAZINE (APRESOLINE) 50 MG tablet Take 1 tablet (50 mg total) by mouth 3 (three) times daily with meals. 90 tablet 2   No facility-administered medications prior to visit.     ROS Review of Systems  Respiratory: Negative.   Cardiovascular: Negative.   Gastrointestinal: Negative.   Genitourinary: Positive for vaginal bleeding.  Neurological: Negative.     Objective:  BP 128/72 (BP Location: Left Arm, Patient Position: Sitting, Cuff Size: Normal)   Pulse 77   Temp 98.1 F (36.7 C) (Oral)   Resp 18   Ht 5\' 6"  (1.676 m)   Wt 185 lb (83.9 kg)   SpO2 100%   BMI 29.86 kg/m   BP/Weight 07/24/2016 07/02/2016 0000000  Systolic BP 0000000 [id number]  Diastolic BP 72 65 80  Wt. (Lbs) 185 189 185  BMI 29.86 30.51 29.86  Some encounter information is confidential and restricted. Go to Review Flowsheets activity to see all data.     Physical Exam  Constitutional: She is oriented to person, place, and time.  Cardiovascular: Normal rate, regular rhythm, normal heart sounds and intact distal pulses.   Pulmonary/Chest: Effort normal and breath sounds normal.  Abdominal: Soft. Bowel sounds are normal. She exhibits no mass. There is  no tenderness.  Neurological: She is alert and oriented to person, place, and time.  Skin: Skin is warm and dry.  Nursing note and vitals reviewed.    Assessment & Plan:   Problem List Items Addressed This Visit    None    Visit Diagnoses    Abnormal uterine bleeding    -  Primary   Relevant Orders   Ambulatory referral to Gynecology   CBC with Differential (Completed)   FSH/LH (Completed)   COMPLETE METABOLIC PANEL WITH GFR (Completed)   Protime-INR (Completed)       Follow-up: Return if symptoms worsen or fail to  improve.   [name]

## 2016-07-25 ENCOUNTER — Telehealth: Payer: Self-pay | Admitting: Family Medicine

## 2016-07-25 ENCOUNTER — Ambulatory Visit: Payer: Medicaid Other | Admitting: Family Medicine

## 2016-07-25 ENCOUNTER — Telehealth: Payer: Self-pay | Admitting: Licensed Clinical Social Worker

## 2016-07-25 LAB — CBC WITH DIFFERENTIAL/PLATELET
BASOS PCT: 0 %
Basophils Absolute: 0 cells/uL (ref 0–200)
EOS ABS: 134 {cells}/uL (ref 15–500)
Eosinophils Relative: 2 %
HEMATOCRIT: 38.7 % (ref 35.0–45.0)
Hemoglobin: 12.6 g/dL (ref 11.7–15.5)
LYMPHS PCT: 45 %
Lymphs Abs: 3015 cells/uL (ref 850–3900)
MCH: 29 pg (ref 27.0–33.0)
MCHC: 32.6 g/dL (ref 32.0–36.0)
MCV: 89 fL (ref 80.0–100.0)
MONO ABS: 603 {cells}/uL (ref 200–950)
MPV: 10.4 fL (ref 7.5–12.5)
Monocytes Relative: 9 %
Neutro Abs: 2948 cells/uL (ref 1500–7800)
Neutrophils Relative %: 44 %
Platelets: 228 10*3/uL (ref 140–400)
RBC: 4.35 MIL/uL (ref 3.80–5.10)
RDW: 15.3 % — AB (ref 11.0–15.0)
WBC: 6.7 10*3/uL (ref 3.8–10.8)

## 2016-07-25 LAB — COMPLETE METABOLIC PANEL WITH GFR
ALBUMIN: 4.5 g/dL (ref 3.6–5.1)
ALK PHOS: 62 U/L (ref 33–115)
ALT: 16 U/L (ref 6–29)
AST: 16 U/L (ref 10–35)
BILIRUBIN TOTAL: 0.4 mg/dL (ref 0.2–1.2)
BUN: 24 mg/dL (ref 7–25)
CO2: 25 mmol/L (ref 20–31)
Calcium: 9.9 mg/dL (ref 8.6–10.2)
Chloride: 105 mmol/L (ref 98–110)
Creat: 1.01 mg/dL (ref 0.50–1.10)
GFR, Est African American: 77 mL/min (ref >=60)
GFR, Est Non African American: 67 mL/min (ref >=60)
GLUCOSE: 88 mg/dL (ref 65–99)
Potassium: 4 mmol/L (ref 3.5–5.3)
SODIUM: 140 mmol/L (ref 135–146)
TOTAL PROTEIN: 8.2 g/dL — AB (ref 6.1–8.1)

## 2016-07-25 LAB — FSH/LH
FSH: 10.8 m[IU]/mL
LH: 7.4 m[IU]/mL

## 2016-07-25 LAB — PROTIME-INR
INR: 1
Prothrombin Time: 10.8 s (ref 9.0–11.5)

## 2016-07-25 NOTE — Telephone Encounter (Signed)
LCSWA received an incoming call from pt who reported need for housing resources. Pt was scheduled an appointment for March 1, 18 at 10:00 am.

## 2016-07-25 NOTE — Telephone Encounter (Signed)
Notified patient of her labs results. She verbalizes understanding. Plan for referral follow up.

## 2016-07-29 ENCOUNTER — Other Ambulatory Visit: Payer: Self-pay | Admitting: *Deleted

## 2016-07-29 DIAGNOSIS — I10 Essential (primary) hypertension: Secondary | ICD-10-CM

## 2016-07-29 MED ORDER — HYDRALAZINE HCL 50 MG PO TABS
50.0000 mg | ORAL_TABLET | Freq: Three times a day (TID) | ORAL | 1 refills | Status: DC
Start: 2016-07-29 — End: 2016-10-02

## 2016-07-30 MED FILL — hydrALAZINE HCL 50 MG TABS: 50 | 30 days supply | Qty: 90 | Fill #0

## 2016-07-31 ENCOUNTER — Ambulatory Visit: Payer: Medicaid Other | Admitting: Licensed Clinical Social Worker

## 2016-08-14 MED FILL — AMOXICILLIN 500 MG CAPSULE: 500 | 10 days supply | Qty: 30 | Fill #0

## 2016-08-20 ENCOUNTER — Institutional Professional Consult (permissible substitution): Payer: Medicaid Other | Admitting: Licensed Clinical Social Worker

## 2016-08-27 ENCOUNTER — Other Ambulatory Visit: Payer: Self-pay | Admitting: Internal Medicine

## 2016-08-27 ENCOUNTER — Other Ambulatory Visit: Payer: Self-pay | Admitting: Family Medicine

## 2016-08-27 ENCOUNTER — Institutional Professional Consult (permissible substitution): Payer: Medicaid Other | Admitting: Licensed Clinical Social Worker

## 2016-08-27 ENCOUNTER — Telehealth: Payer: Self-pay | Admitting: Licensed Clinical Social Worker

## 2016-08-27 NOTE — Telephone Encounter (Signed)
LCSWA received an incoming call from pt. Pt stated that Medicaid was unable to provide her transportation today; however, she is still in need of housing resources.   LCSWA provided requested resources over the phone and agreed to mail information to pt's address at [address], [place], [place] [address]. LCSWA inquired about additional transportation resources. Pt stated that she has a [employer] application and is in the process of having it completed.   LCSWA provided pt with RN Case Manager's, Eden Lathe, contact information to assist with application, if needed. No further concerns noted.

## 2016-09-01 MED FILL — ATORVASTATIN 10 MG TABLET: 10 | 30 days supply | Qty: 30 | Fill #0

## 2016-09-01 MED FILL — HYDROCHLOROTHIAZIDE 25 MG T: 25 | 30 days supply | Qty: 30 | Fill #0

## 2016-09-01 MED FILL — AMLODIPINE BESYLATE 10 MG T: 10 | 30 days supply | Qty: 15 | Fill #3

## 2016-09-01 MED FILL — LISINOPRIL 20 MG TABLET: 20 | 30 days supply | Qty: 60 | Fill #0

## 2016-09-01 MED FILL — SPIRONOLACTONE 50 MG TABLET: 50 | 30 days supply | Qty: 30 | Fill #0

## 2016-09-02 ENCOUNTER — Telehealth: Payer: Self-pay

## 2016-09-02 ENCOUNTER — Telehealth: Payer: Self-pay | Admitting: Family Medicine

## 2016-09-02 DIAGNOSIS — Z8673 Personal history of transient ischemic attack (TIA), and cerebral infarction without residual deficits: Secondary | ICD-10-CM

## 2016-09-02 DIAGNOSIS — I10 Essential (primary) hypertension: Secondary | ICD-10-CM

## 2016-09-02 MED ORDER — CLOPIDOGREL BISULFATE 75 MG PO TABS
75.0000 mg | ORAL_TABLET | Freq: Every day | ORAL | 3 refills | Status: DC
Start: 2016-09-02 — End: 2017-02-06

## 2016-09-02 MED ORDER — METOPROLOL TARTRATE 50 MG PO TABS: 50.0000 mg | ORAL_TABLET | Freq: Two times a day (BID) | ORAL | 3 refills | Status: DC

## 2016-09-02 MED FILL — METOPROLOL TARTRATE 50 MG T: 50 | 30 days supply | Qty: 60 | Fill #0

## 2016-09-02 MED FILL — CLOPIDOGREL 75 MG TABLET: 75 | 30 days supply | Qty: 30 | Fill #0

## 2016-09-02 NOTE — Telephone Encounter (Signed)
Met with the patient when she was in the clinic today. Assisted her with completing the [employer] application and the completed application was faxed to [employer] eligibility  - fax # [phone]. Also provided the patient with the following housing resource information as she said she is currently staying with an uncle but may be in need of housing in the near future.  Museum/gallery curator  # [phone] [place] # [phone]

## 2016-09-02 NOTE — Telephone Encounter (Signed)
Prescriptions were printed last time so I am not sure the pharmacy got them - refilled by escript and sent to [facility] pharmacy.

## 2016-09-02 NOTE — Telephone Encounter (Signed)
Pt came in requesting refill of Plavix and Metoprolol. Please f/u. Thank you.

## 2016-09-05 ENCOUNTER — Other Ambulatory Visit: Payer: Self-pay | Admitting: Family Medicine

## 2016-09-05 ENCOUNTER — Encounter: Payer: Self-pay | Admitting: Neurology

## 2016-09-05 ENCOUNTER — Ambulatory Visit (INDEPENDENT_AMBULATORY_CARE_PROVIDER_SITE_OTHER): Payer: Medicaid Other | Admitting: Neurology

## 2016-09-05 VITALS — BP 110/70 | HR 62 | Ht 66.0 in | Wt 181.4 lb

## 2016-09-05 DIAGNOSIS — E785 Hyperlipidemia, unspecified: Secondary | ICD-10-CM

## 2016-09-05 DIAGNOSIS — F32A Depression, unspecified: Secondary | ICD-10-CM

## 2016-09-05 DIAGNOSIS — F17210 Nicotine dependence, cigarettes, uncomplicated: Secondary | ICD-10-CM

## 2016-09-05 DIAGNOSIS — I639 Cerebral infarction, unspecified: Secondary | ICD-10-CM

## 2016-09-05 DIAGNOSIS — G43009 Migraine without aura, not intractable, without status migrainosus: Secondary | ICD-10-CM | POA: Diagnosis not present

## 2016-09-05 DIAGNOSIS — I69959 Hemiplegia and hemiparesis following unspecified cerebrovascular disease affecting unspecified side: Secondary | ICD-10-CM

## 2016-09-05 DIAGNOSIS — I10 Essential (primary) hypertension: Secondary | ICD-10-CM

## 2016-09-05 DIAGNOSIS — F329 Major depressive disorder, single episode, unspecified: Secondary | ICD-10-CM

## 2016-09-05 MED ORDER — VENLAFAXINE HCL ER 75 MG PO CP24: 75.0000 mg | ORAL_CAPSULE | Freq: Every day | ORAL | 1 refills | Status: DC

## 2016-09-05 MED FILL — VENLAFAXINE HCL ER 75 MG CA: 75 | 30 days supply | Qty: 30 | Fill #0

## 2016-09-05 NOTE — Progress Notes (Signed)
NEUROLOGY CONSULTATION NOTE  Brandy Roberts MRN: [id number] DOB: 01-19-1971  Referring provider: Fredia Beets, FNP Primary care provider: Fredia Beets, FNP  Reason for consult:  headache  HISTORY OF PRESENT ILLNESS: Brandy Roberts is a 46 year old right-handed female with HTN, hyperlipidemia, and history of stroke who presents for headache.  History supplemented by PCP notes and prior hospital notes from 2016.  Onset:  Migraines since she was young, but worse since her stroke in May 2016. Location:  holocephalic Quality:  pounding Intensity:  10/10 Aura:  no Prodrome:  no Postdrome:  no Associated symptoms:  Photophobia, phonophobia.  Rarely nausea.  She has not had any new worse headache of her life, waking up from sleep Duration:  2 days (but Fioricet and ibuprofen lowers intensity down to 2-3/10) Frequency:  Once or twice a month Frequency of abortive medication: only as needed Triggers/exacerbating factors:  no Relieving factors:  Laying down.  Butalbital, ibuprofen.  Tramadol helped but she was told not to take it. Activity:  aggravates  Past NSAIDS:  no Past analgesics:  Tylenol, Excedrin, Tramadol (effective) Past abortive triptans:  no Past muscle relaxants:  no Past anti-emetic:  no Past antihypertensive medications:  no Past antidepressant medications:  no Past anticonvulsant medications:  no Past vitamins/Herbal/Supplements:  no Other past therapies:  no  Current NSAIDS:  ibuprofen Current analgesics: Fioricet Current triptans:  no Current anti-emetic:  no Current muscle relaxants:  no Current anti-anxiolytic:  no Current sleep aide:  no Current Antihypertensive medications:  amlodipine 5mg , hydralazine 50mg  three times daily, HCTZ, spironolactone, Lisinopril, metoprolol 50mg  Current Antidepressant medications:  no Current Anticonvulsant medications:  gabapentin 300mg  Current Vitamins/Herbal/Supplements:  no Current  Antihistamines/Decongestants:  no Other therapy:  no  Caffeine:  Coffee, sweet tea Alcohol:  no Smoker:  Yes (cigarettes) Diet:  hydrates Exercise:  no Depression/anxiety:  Depression since the stroke Sleep hygiene:  Sometimes tosses and turns Family history of headache:  Sister.  07/24/16 Labs:  CBC with WBC 6.7, HGB 12.6, HCT 38.7, PLT 228; CMP with Na 140, K 4, Cl 105, CO2 25, glucose 88, BUN 24, Cr 1.01, total bili 0.4, ALP 62, AST 16 and ALT 16.  She had a stroke in May 2016 with left sided weakness.  MRI of brain from 10/06/14 was personally reviewed and revealed small acute lacunar infarcts in the right thalamus and right corona radiata, as well as chronic lacunar infarcts in the left hemisphere.  CTA of head and neck revealed diffuse bilateral petrous and cavernous carotid stenosis.  Cardiac source of embolus was not discovered.    PAST MEDICAL HISTORY: Past Medical History:  Diagnosis Date  . Headache   . Hyperlipidemia   . Hypertension   . Stroke [facility])     PAST SURGICAL HISTORY: Past Surgical History:  Procedure Laterality Date  . CESAREAN SECTION    . GANGLION CYST EXCISION    . TEE WITHOUT CARDIOVERSION N/A 10/09/2014   Procedure: TRANSESOPHAGEAL ECHOCARDIOGRAM (TEE);  Surgeon: Sanda Klein, MD;  Location: [facility] ENDOSCOPY;  Service: Cardiovascular;  Laterality: N/A;  . TONSILLECTOMY      MEDICATIONS: Current Outpatient Prescriptions on File Prior to Visit  Medication Sig Dispense Refill  . amLODipine (NORVASC) 10 MG tablet Take 0.5 tablets (5 mg total) by mouth daily. 30 tablet 3  . atorvastatin (LIPITOR) 10 MG tablet TAKE 1 TABLET BY MOUTH DAILY AT 6 PM 30 tablet 2  . bacitracin-neomycin-polymyxin-hydrocortisone (CORTISPORIN) 1 % ointment Apply 1 application topically 2 (two) times daily.  15 g 0  . butalbital-acetaminophen-caffeine (FIORICET, ESGIC) 50-325-40 MG tablet Take 1 tablet by mouth every 6 (six) hours as needed for headache. 15 tablet 3  . clopidogrel  (PLAVIX) 75 MG tablet Take 1 tablet (75 mg total) by mouth daily. 30 tablet 3  . gabapentin (NEURONTIN) 300 MG capsule Take 1 capsule (300 mg total) by mouth at bedtime. 30 capsule 2  . hydrALAZINE (APRESOLINE) 50 MG tablet Take 1 tablet (50 mg total) by mouth 3 (three) times daily with meals. 90 tablet 1  . hydrochlorothiazide (HYDRODIURIL) 25 MG tablet TAKE 1 TABLET (25 MG TOTAL) BY MOUTH DAILY. 30 tablet 2  . ibuprofen (ADVIL,MOTRIN) 800 MG tablet Take 1 tablet (800 mg total) by mouth every 8 (eight) hours as needed. 90 tablet 0  . lisinopril (PRINIVIL,ZESTRIL) 20 MG tablet TAKE 1 TABLET (20 MG TOTAL) BY MOUTH 2 (TWO) TIMES DAILY. 60 tablet 2  . metoprolol (LOPRESSOR) 50 MG tablet Take 1 tablet (50 mg total) by mouth 2 (two) times daily. 60 tablet 3  . spironolactone (ALDACTONE) 50 MG tablet Take 1 tablet (50 mg total) by mouth daily. 30 tablet 2   No current facility-administered medications on file prior to visit.     ALLERGIES: No Known Allergies  FAMILY HISTORY: Family History  Problem Relation Age of Onset  . Cancer Mother   . Hypertension Mother   . Hypertension Sister     SOCIAL HISTORY: Social History   Social History  . Marital status: Single    Spouse name: N/A  . Number of children: 2  . Years of education: 12   Occupational History  . N/A    Social History Main Topics  . Smoking status: Former Smoker    Packs/day: 0.25    Quit date: 11/01/2014  . Smokeless tobacco: Never Used  . Alcohol use No  . Drug use: No  . Sexual activity: Not on file   Other Topics Concern  . Not on file   Social History Narrative   Patient lives with her uncle in a one story home.  Has 2 sons.  Currently on disability.  Education: high school.   Drinks 1-2 sodas a week     REVIEW OF SYSTEMS: Constitutional: No fevers, chills, or sweats, no generalized fatigue, change in appetite Eyes: No visual changes, double vision, eye pain Ear, nose and throat: No hearing loss, ear pain,  nasal congestion, sore throat Cardiovascular: No chest pain, palpitations Respiratory:  No shortness of breath at rest or with exertion, wheezes GastrointestinaI: No nausea, vomiting, diarrhea, abdominal pain, fecal incontinence Genitourinary:  No dysuria, urinary retention or frequency Musculoskeletal:  No neck pain, back pain Integumentary: No rash, pruritus, skin lesions Neurological: as above Psychiatric: No depression, insomnia, anxiety Endocrine: No palpitations, fatigue, diaphoresis, mood swings, change in appetite, change in weight, increased thirst Hematologic/Lymphatic:  No purpura, petechiae. Allergic/Immunologic: no itchy/runny eyes, nasal congestion, recent allergic reactions, rashes  PHYSICAL EXAM: Vitals:   09/05/16 0745  BP: 110/70  Pulse: 62   General: No acute distress.  Patient appears well-groomed.  Head:  Normocephalic/atraumatic Eyes:  fundi examined but not visualized Neck: supple, no paraspinal tenderness, full range of motion Back: No paraspinal tenderness Heart: regular rate and rhythm Lungs: Clear to auscultation bilaterally. Vascular: No carotid bruits. Neurological Exam: Mental status: alert and oriented to person, place, and time, recent and remote memory intact, fund of knowledge intact, attention and concentration intact, speech fluent and not dysarthric, language intact. Cranial nerves: CN I:  not tested CN II: pupils equal, round and reactive to light, visual fields intact CN III, IV, VI:  full range of motion, no nystagmus, no ptosis CN V: facial sensation intact CN VII: upper and lower face symmetric CN VIII: hearing intact CN IX, X: gag intact, uvula midline CN XI: sternocleidomastoid and trapezius muscles intact CN XII: tongue midline Bulk & Tone: normal, no fasciculations. Motor:  5-/5 left deltoid and bicep, 4+/5 left tricep, hand grip and left lower extremity.  5/5 on right. Sensation:  Mildly reduced temperature and vibration sensation  intact in left upper and lower extremities. Deep Tendon Reflexes:  3+ throughout, slightly more brisk on the left, toes downgoing.  Finger to nose testing:  Without dysmetria.  Slowed on the left Heel to shin:  Without dysmetria.  Slowed on the left. Gait:  Left hemiparetic gait.  Able to turn, unable to tandem walk. Romberg with sway.  IMPRESSION: 1.  Migraine without aura 2.  Hemiplegia as late effect of stroke 3.  Depression 4.  Hypertension 5.  Hyperlipidemia 6.  Cigarette smoker  PLAN: 1.  Will start venlfaxine XR 75mg  daily to address both depression and migraines.  Contact us in 6 weeks with update. 2.  For abortive therapy, continue Fioricet and ibuprofen 3.  Consider supplements:  Mg, B2, CoQ10 for headache 4.  Smoking cessation, exercise 5.  Continue Plavix for secondary stroke prevention (as managed by PCP) 6.  Continue statin therapy as managed by PCP (LDL goal should be less than 70) 7.  Continue blood pressure control 8.  Follow up in 3 months  Thank you for allowing me to take part in the care of this patient.  Metta Clines, DO  CC:  Fredia Beets, FNP

## 2016-09-05 NOTE — Patient Instructions (Signed)
Migraine Recommendations: 1.  Start venlaxine XR 75mg  daily.  It will treat migraine and depression.  Call in 6 weeks with update and we can adjust dose if needed. 2.  Continue using the butalbital-acetaminophen-caffeine pill and ibuprofen as needed. 3.  Limit use of pain relievers to no more than 2 days out of the week.  These medications include acetaminophen, ibuprofen, triptans and narcotics.  This will help reduce risk of rebound headaches. 4.  Be aware of common food triggers such as processed sweets, processed foods with nitrites (such as deli meat, hot dogs, sausages), foods with MSG, alcohol (such as wine), chocolate, certain cheeses, certain fruits (dried fruits, some citrus fruit), vinegar, diet soda. 4.  Avoid caffeine 5.  Routine exercise 6.  Proper sleep hygiene 7.  Stay adequately hydrated with water 8.  Keep a headache diary. 9.  Maintain proper stress management. 10.  Do not skip meals. 11.  Consider supplements:  Magnesium citrate 400mg  to 600mg  daily, riboflavin 400mg , Coenzyme Q 10 100mg  three times daily  For Stroke prevention 1.  Continue plavix, atorvastatin and blood pressure medications 2.  Quit smoking 3.  Exercise  Follow up in 3 months.

## 2016-09-08 MED FILL — hydrALAZINE HCL 50 MG TABS: 50 | 30 days supply | Qty: 90 | Fill #1

## 2016-09-09 ENCOUNTER — Other Ambulatory Visit: Payer: Self-pay

## 2016-09-09 ENCOUNTER — Telehealth: Payer: Self-pay

## 2016-09-09 NOTE — Telephone Encounter (Signed)
Call placed to [employer] to confirm receipt of the application. Voicemail message left requesting a call back to # [phone]

## 2016-09-09 NOTE — Telephone Encounter (Signed)
Call placed to [employer] to confirm receipt of the application. Voicemail message left requesting a call back to # [phone]

## 2016-09-22 ENCOUNTER — Telehealth: Payer: Self-pay

## 2016-09-22 NOTE — Telephone Encounter (Signed)
Opened in error

## 2016-09-24 ENCOUNTER — Encounter: Payer: Self-pay | Admitting: Obstetrics and Gynecology

## 2016-09-24 ENCOUNTER — Ambulatory Visit (INDEPENDENT_AMBULATORY_CARE_PROVIDER_SITE_OTHER): Payer: Medicaid Other | Admitting: Obstetrics and Gynecology

## 2016-09-24 DIAGNOSIS — N950 Postmenopausal bleeding: Secondary | ICD-10-CM | POA: Diagnosis present

## 2016-09-24 NOTE — Progress Notes (Signed)
Ms Brandy Roberts presents for eval of PMB. She is a [id number]. H/O EAB x 1 and term c section x 2  She reports bleeding the whole month of Feb. No bleeding prior for at least a yr. She also reports some menopausal Sx. Sexual active. Same sex partner  PMH is complicated by CVA in 4/79, HTN, hyperlipidemia and migraines. Followed by Neurology and IM.  PE AF VSS Lungs clear Heart RR ? Murmur Abd soft + BS  A/P PMB  Pt very nervous about EMBX today. Reasoning for Bx reviewed with pt. D/T pt's hesitation, we will check a GYN U/S to eval endometrial stripe. Pending these results pt was informed that [facility] may still be recommended. F/U in 2-3 weeks to discuss U/S results

## 2016-09-24 NOTE — Patient Instructions (Signed)
Endometrial Biopsy  Endometrial biopsy is a procedure in which a tissue sample is taken from inside the uterus. The sample is taken from the endometrium, which is the lining of the uterus. The tissue sample is then checked under a microscope to see if the tissue is normal or abnormal. This procedure helps to determine where you are in your menstrual cycle and how hormone levels are affecting the lining of the uterus. This procedure may also be used to evaluate uterine bleeding or to diagnose endometrial cancer, endometrial tuberculosis, polyps, or other inflammatory conditions.  Tell a health care provider about:   Any allergies you have.   All medicines you are taking, including vitamins, herbs, eye drops, creams, and over-the-counter medicines.   Any problems you or family members have had with anesthetic medicines.   Any blood disorders you have.   Any surgeries you have had.   Any medical conditions you have.   Whether you are pregnant or may be pregnant.  What are the risks?  Generally, this is a safe procedure. However, problems may occur, including:   Bleeding.   Pelvic infection.   Puncture of the wall of the uterus with the biopsy device (rare).    What happens before the procedure?   Keep a record of your menstrual cycles as told by your health care provider. You may need to schedule your procedure for a specific time in your cycle.   You may want to bring a sanitary pad to wear after the procedure.   Ask your health care provider about:  ? Changing or stopping your regular medicines. This is especially important if you are taking diabetes medicines or blood thinners.  ? Taking medicines such as aspirin and ibuprofen. These medicines can thin your blood. Do not take these medicines before your procedure if your health care provider instructs you not to.   Plan to have someone take you home from the hospital or clinic.  What happens during the procedure?   To lower your risk of  infection:  ? Your health care team will wash or sanitize their hands.   You will lie on an exam table with your feet and legs supported as in a pelvic exam.   Your health care provider will insert an instrument (speculum) into your vagina to see your cervix.   Your cervix will be cleansed with an antiseptic solution.   A medicine (local anesthetic) will be used to numb the cervix.   A forceps instrument (tenaculum) will be used to hold your cervix steady for the biopsy.   A thin, rod-like instrument (uterine sound) will be inserted through your cervix to determine the length of your uterus and the location where the biopsy sample will be removed.   A thin, flexible tube (catheter) will be inserted through your cervix and into the uterus. The catheter will be used to collect the biopsy sample from your endometrial tissue.   The catheter and speculum will then be removed, and the tissue sample will be sent to a lab for examination.  What happens after the procedure?   You will rest in a recovery area until you are ready to go home.   You may have mild cramping and a small amount of vaginal bleeding. This is normal.   It is up to you to get the results of your procedure. Ask your health care provider, or the department that is doing the procedure, when your results will be ready.  Summary     Endometrial biopsy is a procedure in which a tissue sample is taken from the endometrium, which is the lining of the uterus.   This procedure may help to diagnose menstrual cycle problems, abnormal bleeding, or other conditions affecting the endometrium.   Before the procedure, keep a record of your menstrual cycles as told by your health care provider.   The tissue sample that is removed will be checked under a microscope to see if it is normal or abnormal.  This information is not intended to replace advice given to you by your health care provider. Make sure you discuss any questions you have with your health care  provider.  Document Released: 09/19/2004 Document Revised: 06/04/2016 Document Reviewed: 06/04/2016  Elsevier Interactive Patient Education  2017 [employer]

## 2016-10-01 ENCOUNTER — Ambulatory Visit (HOSPITAL_COMMUNITY)
Admission: RE | Admit: 2016-10-01 | Discharge: 2016-10-01 | Disposition: A | Discharge status: Home / Self Care | Payer: Medicaid Other | Source: Ambulatory Visit | Attending: Obstetrics and Gynecology | Admitting: Obstetrics and Gynecology

## 2016-10-01 DIAGNOSIS — D259 Leiomyoma of uterus, unspecified: Secondary | ICD-10-CM | POA: Diagnosis not present

## 2016-10-01 DIAGNOSIS — N950 Postmenopausal bleeding: Secondary | ICD-10-CM | POA: Diagnosis present

## 2016-10-02 ENCOUNTER — Other Ambulatory Visit: Payer: Self-pay | Admitting: Family Medicine

## 2016-10-02 DIAGNOSIS — I10 Essential (primary) hypertension: Secondary | ICD-10-CM

## 2016-10-02 MED FILL — hydrALAZINE HCL 50 MG TABS: 50 | 30 days supply | Qty: 90 | Fill #0

## 2016-10-02 MED FILL — LISINOPRIL 20 MG TABLET: 20 | 30 days supply | Qty: 60 | Fill #1

## 2016-10-02 MED FILL — ATORVASTATIN 10 MG TABLET: 10 | 30 days supply | Qty: 30 | Fill #1

## 2016-10-02 MED FILL — VENLAFAXINE HCL ER 75 MG CA: 75 | 30 days supply | Qty: 30 | Fill #1

## 2016-10-02 MED FILL — CLOPIDOGREL 75 MG TABLET: 75 | 30 days supply | Qty: 30 | Fill #1

## 2016-10-02 MED FILL — HYDROCHLOROTHIAZIDE 25 MG T: 25 | 30 days supply | Qty: 30 | Fill #1

## 2016-10-02 MED FILL — SPIRONOLACTONE 50 MG TABLET: 50 | 30 days supply | Qty: 30 | Fill #1

## 2016-10-02 MED FILL — METOPROLOL TARTRATE 50 MG T: 50 | 30 days supply | Qty: 60 | Fill #1

## 2016-10-03 MED FILL — AMLODIPINE BESYLATE 10 MG T: 10 | 30 days supply | Qty: 15 | Fill #4

## 2016-10-17 ENCOUNTER — Ambulatory Visit: Payer: Medicaid Other | Admitting: Obstetrics and Gynecology

## 2016-10-17 ENCOUNTER — Telehealth: Payer: Self-pay | Admitting: Obstetrics and Gynecology

## 2016-10-17 DIAGNOSIS — N950 Postmenopausal bleeding: Secondary | ICD-10-CM

## 2016-10-17 NOTE — Telephone Encounter (Signed)
Patient was scheduled to come in today at 8:40 for [place] results. 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 Her appointment has been rescheduled, but she wanted to know if she could get a call to speak to someone about her results.

## 2016-10-23 NOTE — Telephone Encounter (Addendum)
Called pt to discuss her [place] results as requested. I provided the results of a probable uterine fibroid and I informed her that because the vaginal probe [place] was not performed, it was not possible to obtain additional pertinent information regarding other possible causes of her PMB. I advised that if she could return for that scan, it would be easier for the doctor to develop plan of care. We further discussed the vaginal probe [place] procedure and pt agreed to appt on 6/1 @ 1300. Follow up office appt is scheduled on 6/21.

## 2016-10-23 NOTE — Addendum Note (Signed)
Addended by: Langston Reusing on: 10/23/2016 11:43 AM   Modules accepted: Orders

## 2016-10-24 ENCOUNTER — Observation Stay (HOSPITAL_COMMUNITY)
Admission: EM | Admit: 2016-10-24 | Discharge: 2016-10-26 | Disposition: A | Discharge status: Home / Self Care | Payer: Medicaid Other | Attending: Family Medicine | Admitting: Family Medicine

## 2016-10-24 ENCOUNTER — Encounter (HOSPITAL_COMMUNITY): Payer: Self-pay | Admitting: Emergency Medicine

## 2016-10-24 ENCOUNTER — Emergency Department (HOSPITAL_COMMUNITY): Payer: Medicaid Other

## 2016-10-24 DIAGNOSIS — E785 Hyperlipidemia, unspecified: Secondary | ICD-10-CM | POA: Diagnosis not present

## 2016-10-24 DIAGNOSIS — Z6830 Body mass index (BMI) 30.0-30.9, adult: Secondary | ICD-10-CM | POA: Diagnosis not present

## 2016-10-24 DIAGNOSIS — I119 Hypertensive heart disease without heart failure: Secondary | ICD-10-CM | POA: Diagnosis present

## 2016-10-24 DIAGNOSIS — I43 Cardiomyopathy in diseases classified elsewhere: Secondary | ICD-10-CM

## 2016-10-24 DIAGNOSIS — D649 Anemia, unspecified: Secondary | ICD-10-CM | POA: Diagnosis not present

## 2016-10-24 DIAGNOSIS — F17210 Nicotine dependence, cigarettes, uncomplicated: Secondary | ICD-10-CM | POA: Diagnosis not present

## 2016-10-24 DIAGNOSIS — R4781 Slurred speech: Secondary | ICD-10-CM | POA: Insufficient documentation

## 2016-10-24 DIAGNOSIS — I638 Other cerebral infarction: Secondary | ICD-10-CM

## 2016-10-24 DIAGNOSIS — Z79899 Other long term (current) drug therapy: Secondary | ICD-10-CM | POA: Diagnosis not present

## 2016-10-24 DIAGNOSIS — I6389 Other cerebral infarction: Secondary | ICD-10-CM

## 2016-10-24 DIAGNOSIS — I69354 Hemiplegia and hemiparesis following cerebral infarction affecting left non-dominant side: Principal | ICD-10-CM | POA: Insufficient documentation

## 2016-10-24 DIAGNOSIS — G8194 Hemiplegia, unspecified affecting left nondominant side: Secondary | ICD-10-CM | POA: Diagnosis present

## 2016-10-24 DIAGNOSIS — Z7902 Long term (current) use of antithrombotics/antiplatelets: Secondary | ICD-10-CM | POA: Insufficient documentation

## 2016-10-24 DIAGNOSIS — I63411 Cerebral infarction due to embolism of right middle cerebral artery: Secondary | ICD-10-CM | POA: Diagnosis present

## 2016-10-24 DIAGNOSIS — N179 Acute kidney failure, unspecified: Secondary | ICD-10-CM | POA: Diagnosis not present

## 2016-10-24 DIAGNOSIS — M545 Low back pain: Secondary | ICD-10-CM | POA: Insufficient documentation

## 2016-10-24 DIAGNOSIS — G4733 Obstructive sleep apnea (adult) (pediatric): Secondary | ICD-10-CM | POA: Insufficient documentation

## 2016-10-24 DIAGNOSIS — H9202 Otalgia, left ear: Secondary | ICD-10-CM | POA: Insufficient documentation

## 2016-10-24 DIAGNOSIS — I351 Nonrheumatic aortic (valve) insufficiency: Secondary | ICD-10-CM | POA: Insufficient documentation

## 2016-10-24 DIAGNOSIS — R001 Bradycardia, unspecified: Secondary | ICD-10-CM | POA: Diagnosis not present

## 2016-10-24 DIAGNOSIS — R531 Weakness: Secondary | ICD-10-CM

## 2016-10-24 DIAGNOSIS — E669 Obesity, unspecified: Secondary | ICD-10-CM | POA: Insufficient documentation

## 2016-10-24 DIAGNOSIS — I639 Cerebral infarction, unspecified: Secondary | ICD-10-CM | POA: Diagnosis present

## 2016-10-24 LAB — CBC
HCT: 34.9 % — ABNORMAL LOW (ref 36.0–46.0)
Hemoglobin: 11.3 g/dL — ABNORMAL LOW (ref 12.0–15.0)
MCH: 29.1 pg (ref 26.0–34.0)
MCHC: 32.4 g/dL (ref 30.0–36.0)
MCV: 89.9 fL (ref 78.0–100.0)
PLATELETS: 206 10*3/uL (ref 150–400)
RBC: 3.88 MIL/uL (ref 3.87–5.11)
RDW: 13.7 % (ref 11.5–15.5)
WBC: 6.9 10*3/uL (ref 4.0–10.5)

## 2016-10-24 LAB — CBG MONITORING, ED: GLUCOSE-CAPILLARY: 100 mg/dL — AB (ref 65–99)

## 2016-10-24 LAB — I-STAT CHEM 8, ED
BUN: 26 mg/dL — ABNORMAL HIGH (ref 6–20)
Calcium, Ion: 1.06 mmol/L — ABNORMAL LOW (ref 1.15–1.40)
Chloride: 103 mmol/L (ref 101–111)
Creatinine, Ser: 1.2 mg/dL — ABNORMAL HIGH (ref 0.44–1.00)
GLUCOSE: 108 mg/dL — AB (ref 65–99)
HEMATOCRIT: 33 % — AB (ref 36.0–46.0)
HEMOGLOBIN: 11.2 g/dL — AB (ref 12.0–15.0)
POTASSIUM: 3.8 mmol/L (ref 3.5–5.1)
Sodium: 137 mmol/L (ref 135–145)
TCO2: 23 mmol/L (ref 0–100)

## 2016-10-24 LAB — COMPREHENSIVE METABOLIC PANEL
ALT: 16 U/L (ref 14–54)
ANION GAP: 9 (ref 5–15)
AST: 16 U/L (ref 15–41)
Albumin: 3.6 g/dL (ref 3.5–5.0)
Alkaline Phosphatase: 67 U/L (ref 38–126)
BUN: 26 mg/dL — ABNORMAL HIGH (ref 6–20)
CHLORIDE: 103 mmol/L (ref 101–111)
CO2: 22 mmol/L (ref 22–32)
Calcium: 8.9 mg/dL (ref 8.9–10.3)
Creatinine, Ser: 1.25 mg/dL — ABNORMAL HIGH (ref 0.44–1.00)
GFR calc non Af Amer: 50 mL/min — ABNORMAL LOW (ref >60)
GFR, EST AFRICAN AMERICAN: 58 mL/min — AB (ref >60)
Glucose, Bld: 113 mg/dL — ABNORMAL HIGH (ref 65–99)
Potassium: 3.9 mmol/L (ref 3.5–5.1)
SODIUM: 134 mmol/L — AB (ref 135–145)
Total Bilirubin: 0.4 mg/dL (ref 0.3–1.2)
Total Protein: 6.8 g/dL (ref 6.5–8.1)

## 2016-10-24 LAB — I-STAT TROPONIN, ED: TROPONIN I, POC: 0.01 ng/mL (ref 0.00–0.08)

## 2016-10-24 LAB — PROTIME-INR
INR: 0.97
PROTHROMBIN TIME: 12.8 s (ref 11.4–15.2)

## 2016-10-24 LAB — DIFFERENTIAL
BASOS PCT: 0 %
Basophils Absolute: 0 10*3/uL (ref 0.0–0.1)
EOS ABS: 0.2 10*3/uL (ref 0.0–0.7)
EOS PCT: 4 %
Lymphocytes Relative: 44 %
Lymphs Abs: 3 10*3/uL (ref 0.7–4.0)
MONO ABS: 0.5 10*3/uL (ref 0.1–1.0)
Monocytes Relative: 8 %
Neutro Abs: 3.1 10*3/uL (ref 1.7–7.7)
Neutrophils Relative %: 44 %

## 2016-10-24 LAB — APTT: aPTT: 32 seconds (ref 24–36)

## 2016-10-24 NOTE — ED Provider Notes (Signed)
[facility] DEPT Provider Note   CSN: [id number] Arrival date & time: 10/24/16  2231 By signing my name below, I, Brandy Roberts, attest that this documentation has been prepared under the direction and in the presence of Dud[place], Mackena Plummer, MD . Electronically Signed: Dyke Roberts, Scribe. 10/24/2016. 11:20 PM.   History   Chief Complaint Chief Complaint  Patient presents with  . Code Stroke   LEVEL 5 CAVEAT DUE TO AMS   HPI Brandy Roberts is a 46 y.o. female with a history of stroke and HTN/HLD who presents to the Emergency Department complaining of sudden onset headache and associated dizziness onset tonight at 10 pm. Hx of left -sided weakness from prior stroke.   The history is provided by medical records and the EMS personnel. No language interpreter was used.  Cerebrovascular Accident  This is a recurrent problem. The problem occurs constantly. The problem has not changed since onset.Pertinent negatives include no chest pain. Nothing aggravates the symptoms. Nothing relieves the symptoms. She has tried nothing for the symptoms. The treatment provided no relief.   Past Medical History:  Diagnosis Date  . Headache   . Hyperlipidemia   . Hypertension   . Stroke [facility])     Patient Active Problem List   Diagnosis Date Noted  . Post-menopausal bleeding 09/24/2016  . OSA (obstructive sleep apnea) 09/03/2015  . Heart valve vegetation 09/03/2015  . Snoring 05/02/2015  . Essential hypertension 05/02/2015  . Tobacco use disorder 05/02/2015  . Headache 05/01/2015  . Dysphagia, post-stroke 02/20/2015  . Former smoker 01/24/2015  . Alteration of sensations, post-stroke 12/22/2014  . Cognitive deficit, post-stroke 11/23/2014  . Left hemiparesis ([place]) 10/23/2014  . Cardiomyopathy due to hypertension ([place]) 10/23/2014  . Chronic ischemic vertebrobasilar artery thalamic stroke   . Aortic valve vegetation   . Hypertensive heart disease   . Overweight (BMI 25.0-29.9)   . H/O  noncompliance with medical treatment, presenting hazards to health   . Aortic regurgitation   . Cerebral infarction due to thrombosis of right middle cerebral artery ([place])   . Hyperlipidemia   . Embolic stroke involving right middle cerebral artery ([place]) 10/06/2014    Past Surgical History:  Procedure Laterality Date  . CESAREAN SECTION    . GANGLION CYST EXCISION    . TEE WITHOUT CARDIOVERSION N/A 10/09/2014   Procedure: TRANSESOPHAGEAL ECHOCARDIOGRAM (TEE);  Surgeon: Sanda Klein, MD;  Location: [facility] ENDOSCOPY;  Service: Cardiovascular;  Laterality: N/A;  . TONSILLECTOMY      OB History    No data available       Home Medications    Prior to Admission medications   Medication Sig Start Date End Date Taking? Authorizing Provider  amLODipine (NORVASC) 10 MG tablet Take 0.5 tablets (5 mg total) by mouth daily. 03/31/16   Kirichenko, Lahoma Rocker, PA-C  atorvastatin (LIPITOR) 10 MG tablet TAKE 1 TABLET BY MOUTH DAILY AT 6 PM 08/27/16   Hairston, Toy Baker R, FNP  bacitracin-neomycin-polymyxin-hydrocortisone (CORTISPORIN) 1 % ointment Apply 1 application topically 2 (two) times daily. 07/02/16   [name], FNP  butalbital-acetaminophen-caffeine (FIORICET, ESGIC) 50-325-40 MG tablet Take 1 tablet by mouth every 6 (six) hours as needed for headache. 03/31/16   Kirichenko, Lahoma Rocker, PA-C  clopidogrel (PLAVIX) 75 MG tablet Take 1 tablet (75 mg total) by mouth daily. 09/02/16   [name], FNP  gabapentin (NEURONTIN) 300 MG capsule Take 1 capsule (300 mg total) by mouth at bedtime. 04/04/16   Argentina Donovan, PA-C  hydrALAZINE (APRESOLINE) 50 MG  tablet TAKE 1 TABLET BY MOUTH 3 TIMES DAILY WITH MEALS. 10/02/16   Hairston, Toy Baker R, FNP  hydrochlorothiazide (HYDRODIURIL) 25 MG tablet TAKE 1 TABLET (25 MG TOTAL) BY MOUTH DAILY. 08/27/16   [name], FNP  ibuprofen (ADVIL,MOTRIN) 800 MG tablet Take 1 tablet (800 mg total) by mouth every 8 (eight) hours as needed. 04/30/16    [name], FNP  lisinopril (PRINIVIL,ZESTRIL) 20 MG tablet TAKE 1 TABLET (20 MG TOTAL) BY MOUTH 2 (TWO) TIMES DAILY. 08/27/16   [name], FNP  metoprolol (LOPRESSOR) 50 MG tablet Take 1 tablet (50 mg total) by mouth 2 (two) times daily. 09/02/16   [name], FNP  spironolactone (ALDACTONE) 50 MG tablet Take 1 tablet (50 mg total) by mouth daily. 04/04/16   Argentina Donovan, PA-C  venlafaxine XR (EFFEXOR XR) 75 MG 24 hr capsule Take 1 capsule (75 mg total) by mouth daily with breakfast. 09/05/16   Pieter Partridge, DO    Family History Family History  Problem Relation Age of Onset  . Cancer Mother   . Hypertension Mother   . Hypertension Sister     Social History Social History  Substance Use Topics  . Smoking status: Former Smoker    Packs/day: 0.25  . Smokeless tobacco: Never Used  . Alcohol use No     Allergies   Patient has no known allergies.   Review of Systems Review of Systems  Unable to perform ROS: Mental status change  Cardiovascular: Negative for chest pain, palpitations and leg swelling.  Neurological: Positive for weakness.   Physical Exam Updated Vital Signs Wt 181 lb 10.5 oz (82.4 kg)   BMI 29.32 kg/m   Physical Exam  Constitutional: She appears well-developed and well-nourished. No distress.  HENT:  Head: Normocephalic and atraumatic.  Mouth/Throat: Oropharynx is clear and moist. No oropharyngeal exudate.  Moist mucous membranes   Eyes: Conjunctivae are normal. Pupils are equal, round, and reactive to light.  A little lid droop on the left  Neck: Normal range of motion. Neck supple. No JVD present.  Trachea midline No bruit  Cardiovascular: Normal rate, regular rhythm, normal heart sounds and intact distal pulses.   Pulmonary/Chest: Effort normal and breath sounds normal. No stridor. No respiratory distress. She has no wheezes. She has no rales.  Abdominal: Soft. Bowel sounds are normal. She exhibits no distension. There  is no rebound and no guarding.  Musculoskeletal: Normal range of motion. She exhibits no edema.  Neurological: She is alert. She has normal reflexes. She displays normal reflexes.  Reflex Scores:      Tricep reflexes are 2+ on the right side and 2+ on the left side.      Bicep reflexes are 2+ on the right side and 2+ on the left side.      Brachioradialis reflexes are 2+ on the right side and 2+ on the left side.      Patellar reflexes are 2+ on the right side and 2+ on the left side.      Achilles reflexes are 2+ on the right side and 2+ on the left side. Skin: Skin is warm and dry. Capillary refill takes less than 2 seconds.  Psychiatric: She has a normal mood and affect. Her behavior is normal.  Nursing note and vitals reviewed.   ED Treatments / Results  DIAGNOSTIC STUDIES:  Oxygen Saturation is 99% on RA, normal by my interpretation.    COORDINATION OF CARE:  11:08 PM Discussed  treatment plan with pt at bedside and pt agreed to plan.   Labs (all labs ordered are listed, but only abnormal results are displayed)  Results for orders placed or performed during the hospital encounter of 10/24/16  Protime-INR  Result Value Ref Range   Prothrombin Time 12.8 11.4 - 15.2 seconds   INR 0.97   APTT  Result Value Ref Range   aPTT 32 24 - 36 seconds  CBC  Result Value Ref Range   WBC 6.9 4.0 - 10.5 K/uL   RBC 3.88 3.87 - 5.11 MIL/uL   Hemoglobin 11.3 (L) 12.0 - 15.0 g/dL   HCT 34.9 (L) 36.0 - 46.0 %   MCV 89.9 78.0 - 100.0 fL   MCH 29.1 26.0 - 34.0 pg   MCHC 32.4 30.0 - 36.0 g/dL   RDW 13.7 11.5 - 15.5 %   Platelets 206 150 - 400 K/uL  Differential  Result Value Ref Range   Neutrophils Relative % 44 %   Neutro Abs 3.1 1.7 - 7.7 K/uL   Lymphocytes Relative 44 %   Lymphs Abs 3.0 0.7 - 4.0 K/uL   Monocytes Relative 8 %   Monocytes Absolute 0.5 0.1 - 1.0 K/uL   Eosinophils Relative 4 %   Eosinophils Absolute 0.2 0.0 - 0.7 K/uL   Basophils Relative 0 %   Basophils Absolute  0.0 0.0 - 0.1 K/uL  Comprehensive metabolic panel  Result Value Ref Range   Sodium 134 (L) 135 - 145 mmol/L   Potassium 3.9 3.5 - 5.1 mmol/L   Chloride 103 101 - 111 mmol/L   CO2 22 22 - 32 mmol/L   Glucose, Bld 113 (H) 65 - 99 mg/dL   BUN 26 (H) 6 - 20 mg/dL   Creatinine, Ser 1.25 (H) 0.44 - 1.00 mg/dL   Calcium 8.9 8.9 - 10.3 mg/dL   Total Protein 6.8 6.5 - 8.1 g/dL   Albumin 3.6 3.5 - 5.0 g/dL   AST 16 15 - 41 U/L   ALT 16 14 - 54 U/L   Alkaline Phosphatase 67 38 - 126 U/L   Total Bilirubin 0.4 0.3 - 1.2 mg/dL   GFR calc non Af Amer 50 (L) >60 mL/min   GFR calc Af Amer 58 (L) >60 mL/min   Anion gap 9 5 - 15  I-stat troponin, ED  Result Value Ref Range   Troponin i, poc 0.01 0.00 - 0.08 ng/mL   Comment 3          CBG monitoring, ED  Result Value Ref Range   Glucose-Capillary 100 (H) 65 - 99 mg/dL  I-Stat Chem 8, ED  Result Value Ref Range   Sodium 137 135 - 145 mmol/L   Potassium 3.8 3.5 - 5.1 mmol/L   Chloride 103 101 - 111 mmol/L   BUN 26 (H) 6 - 20 mg/dL   Creatinine, Ser 1.20 (H) 0.44 - 1.00 mg/dL   Glucose, Bld 108 (H) 65 - 99 mg/dL   Calcium, Ion 1.06 (L) 1.15 - 1.40 mmol/L   TCO2 23 0 - 100 mmol/L   Hemoglobin 11.2 (L) 12.0 - 15.0 g/dL   HCT 33.0 (L) 36.0 - 46.0 %   US Pelvis Complete  Result Date: 10/01/2016 CLINICAL DATA:  Postmenopausal bleeding EXAM: TRANSABDOMINAL ULTRASOUND OF PELVIS TECHNIQUE: Transabdominal ultrasound examination of the pelvis was performed including evaluation of the uterus, ovaries, adnexal regions, and pelvic cul-de-sac. COMPARISON:  CT 11/03/2011 FINDINGS: Uterus Measurements: Limited visualization. Uterus measures 9.0 x 3.9 x  4.3 cm. Scattered calcifications. Note definite visualized focal fibroid. Endometrium Thickness: Not well visualized. Right ovary Measurements: Not visualized. No visible adnexal masses. Left ovary Measurements: Not visualized. No visible adnexal masses. Other findings:  No abnormal free fluid. Note: The patient  refused transvaginal imaging. The patient's bladder was not completely filled and therefore visualization of pelvic structures suboptimal. IMPRESSION: Limited transabdominal study due to circumstances as described above. No definite acute process seen. Electronically Signed   By: Rolm Baptise M.D.   On: 10/01/2016 10:34   Ct Head Code Stroke W/o Cm  Result Date: 10/24/2016 CLINICAL DATA:  Code stroke. LEFT-sided weakness. History of stroke, hypertension and hyperlipidemia. EXAM: CT HEAD WITHOUT CONTRAST TECHNIQUE: Contiguous axial images were obtained from the base of the skull through the vertex without intravenous contrast. COMPARISON:  CT HEAD March 31, 2016 FINDINGS: BRAIN: No intraparenchymal hemorrhage, mass effect nor midline shift. Old LEFT basal ganglia/ corona radiata lacunar infarct with ex vacuo dilatation LEFT lateral ventricle. Old RIGHT basal ganglia/internal capsule lacunar infarct. Subcentimeter LEFT choroidal fissure cyst. No acute large vascular territory infarcts. No abnormal extra-axial fluid collections. Basal cisterns are patent. VASCULAR: Mild calcific atherosclerosis of the carotid siphon. SKULL/SOFT TISSUES: No skull fracture. No significant soft tissue swelling. ORBITS/SINUSES: The included ocular globes and orbital contents are normal.The mastoid aircells and included paranasal sinuses are well-aerated. OTHER: Patient is edentulous. ASPECTS [facility] Stroke Program Early CT Score) - Ganglionic level infarction (caudate, lentiform nuclei, internal capsule, insula, M1-M3 cortex): 7 - Supraganglionic infarction (M4-M6 cortex): 3 Total score (0-10 with 10 being normal): 10 IMPRESSION: 1. No acute intracranial process. Old bilateral basal ganglia/internal capsule lacunar infarcts. 2. ASPECTS is 10. Dr. Cheral Marker, Neurology paged via [facility] hospital paging system on 10/24/2016 at 10:52 pm, awaiting return call. Electronically Signed   By: Elon Alas M.D.   On: 10/24/2016 22:56     EKG   Date: 10/24/2016  Rate: 79  Rhythm: normal sinus rhythm  QRS Axis: normal  Intervals: normal  ST/T Wave abnormalities: normal  Conduction Disutrbances: none  Narrative Interpretation: unremarkable     Radiology Ct Head Code Stroke W/o Cm  Result Date: 10/24/2016 CLINICAL DATA:  Code stroke. LEFT-sided weakness. History of stroke, hypertension and hyperlipidemia. EXAM: CT HEAD WITHOUT CONTRAST TECHNIQUE: Contiguous axial images were obtained from the base of the skull through the vertex without intravenous contrast. COMPARISON:  CT HEAD March 31, 2016 FINDINGS: BRAIN: No intraparenchymal hemorrhage, mass effect nor midline shift. Old LEFT basal ganglia/ corona radiata lacunar infarct with ex vacuo dilatation LEFT lateral ventricle. Old RIGHT basal ganglia/internal capsule lacunar infarct. Subcentimeter LEFT choroidal fissure cyst. No acute large vascular territory infarcts. No abnormal extra-axial fluid collections. Basal cisterns are patent. VASCULAR: Mild calcific atherosclerosis of the carotid siphon. SKULL/SOFT TISSUES: No skull fracture. No significant soft tissue swelling. ORBITS/SINUSES: The included ocular globes and orbital contents are normal.The mastoid aircells and included paranasal sinuses are well-aerated. OTHER: Patient is edentulous. ASPECTS [facility] Stroke Program Early CT Score) - Ganglionic level infarction (caudate, lentiform nuclei, internal capsule, insula, M1-M3 cortex): 7 - Supraganglionic infarction (M4-M6 cortex): 3 Total score (0-10 with 10 being normal): 10 IMPRESSION: 1. No acute intracranial process. Old bilateral basal ganglia/internal capsule lacunar infarcts. 2. ASPECTS is 10. Dr. Cheral Marker, Neurology paged via [facility] hospital paging system on 10/24/2016 at 10:52 pm, awaiting return call. Electronically Signed   By: Elon Alas M.D.   On: 10/24/2016 22:56    Procedures Procedures (including critical care time)  Final Clinical  Impressions(s) / ED Diagnoses  CVA: admit to medicine refused TPA  I personally performed the services described in this documentation, which was scribed in my presence. The recorded information has been reviewed and is accurate.      Jarquis Walker, MD 10/24/16 2336

## 2016-10-24 NOTE — ED Triage Notes (Signed)
Patient is from home, complaining of sudden onset of headache and dizziness at 10pm.  Patient is also having some nausea with the dizziness.  Patient having left sided weakness, family thinks that the weakness is increased from previous stroke.  CBG of 80.  BP of 100/64.

## 2016-10-24 NOTE — Code Documentation (Signed)
Responded to code stroke arriving in ED at 2232. Pt with hx of prior stroke with L sided weakness.  EMS called d/t headache, dizziness, and increased L sided weakness. LSN-2200, CBG-80, BP-100/64.  NIH-15.  Pt drowsy but alert, follows simple commands, and responds to questions mostly with one word answers.  TPA option presented to patient and pt refused medication.  Plan to admit to stroke team.

## 2016-10-24 NOTE — Consult Note (Signed)
Referring Physician: Dr. Randal Buba    Chief Complaint: Worsened left sided weakness relative to baseline  HPI: Brandy Roberts is an 46 y.o. female with prior history of stroke resulting in residual left sided weakness, who presents with acute worsening of her left sided weakness. Initially conversant with EMS; en route she stopped communicating. BP was 100/64 on scene. CBG 80. Home medications include Plavix and low-dose atorvastatin.   LSN: [id number] tPA Given: No: Patient refused tPA after being informed in detail of risks versus benefits  Past Medical History:  Diagnosis Date  . Headache   . Hyperlipidemia   . Hypertension   . Stroke [facility])     Past Surgical History:  Procedure Laterality Date  . CESAREAN SECTION    . GANGLION CYST EXCISION    . TEE WITHOUT CARDIOVERSION N/A 10/09/2014   Procedure: TRANSESOPHAGEAL ECHOCARDIOGRAM (TEE);  Surgeon: Sanda Klein, MD;  Location: [facility] ENDOSCOPY;  Service: Cardiovascular;  Laterality: N/A;  . TONSILLECTOMY      Family History  Problem Relation Age of Onset  . Cancer Mother   . Hypertension Mother   . Hypertension Sister    Social History:  reports that she has quit smoking. She smoked 0.25 packs per day. She has never used smokeless tobacco. She reports that she does not drink alcohol or use drugs.  Allergies: No Known Allergies  Medications: Prior to Admission:  amLODipine (NORVASC) 10 MG tablet Take 0.5 tablets (5 mg total) by mouth daily. Kirichenko, Building surveyor, PA-C Needs Review  atorvastatin (LIPITOR) 10 MG tablet TAKE 1 TABLET BY MOUTH DAILY AT 6 PM Hairston, Mandesia R, FNP Needs Review  bacitracin-neomycin-polymyxin-hydrocortisone (CORTISPORIN) 1 % ointment Apply 1 application topically 2 (two) times daily. [name], FNP Needs Review  butalbital-acetaminophen-caffeine (FIORICET, ESGIC) 50-325-40 MG tablet Take 1 tablet by mouth every 6 (six) hours as needed for headache. Kirichenko, Lahoma Rocker, PA-C Needs Review   clopidogrel (PLAVIX) 75 MG tablet Take 1 tablet (75 mg total) by mouth daily. [name], FNP Needs Review  gabapentin (NEURONTIN) 300 MG capsule Take 1 capsule (300 mg total) by mouth at bedtime. Freeman Caldron M, PA-C Needs Review  hydrALAZINE (APRESOLINE) 50 MG tablet TAKE 1 TABLET BY MOUTH 3 TIMES DAILY WITH MEALS. [name], FNP Needs Review  hydrochlorothiazide (HYDRODIURIL) 25 MG tablet TAKE 1 TABLET (25 MG TOTAL) BY MOUTH DAILY. [name], FNP Needs Review  ibuprofen (ADVIL,MOTRIN) 800 MG tablet Take 1 tablet (800 mg total) by mouth every 8 (eight) hours as needed. [name], FNP Needs Review  lisinopril (PRINIVIL,ZESTRIL) 20 MG tablet TAKE 1 TABLET (20 MG TOTAL) BY MOUTH 2 (TWO) TIMES DAILY. [name], FNP Needs Review  metoprolol (LOPRESSOR) 50 MG tablet Take 1 tablet (50 mg total) by mouth 2 (two) times daily. [name], FNP Needs Review  spironolactone (ALDACTONE) 50 MG tablet Take 1 tablet (50 mg total) by mouth daily. Freeman Caldron M, PA-C Needs Review  venlafaxine XR (EFFEXOR XR) 75 MG 24 hr capsule Take 1 capsule (75 mg total) by mouth daily with breakfast. Tomi Likens, Adam R, DO Needs Review   ROS: Deferred due to acuity of presentation.   Physical Examination: Weight 82.4 kg (181 lb 10.5 oz).  HEENT: Edentulous. Lungs: Respirations unlabored Ext: No edema  Neurologic Examination: Mental Status: Awake. Poor eye contact. Increased latency of verbal and motor responses is not consistent with normal appearing gesticulations with hands while distracted, as well as normal latency of verbal response when addressing questions  of high concern such as whether or not to administer tPA. Speaks with hushed tones slowly and laboriously, not consistent with a hypophonia or a dysphasia due to a cerebral or vocal cord lesion. Speech pattern overall most consistent with embellishment and does not appear physiological. Follows all  motor commands. Slowly answers all questions, consistent with normal speech comprehension.  Cranial Nerves: II:  Visual fields intact bilaterally. PERRL.   III,IV, VI: ptosis not present, EOMI without nystagmus V,VII: smile symmetric, subjective decreased FT and temp sensation on left VIII: hearing intact to voice IX,X: no hoarseness noted XI: no asymmetry XII: midline tongue extension is slow and laborious in conjunction with appearance of exertion by the patient when she follows this command Motor: RUE with inconsistent strength on different trials in addition to giveway. Increased latency of motor response. Maximum strength elicited is 4/5.  RLE with inconsistent strength on different trials in addition to giveway. Increased latency of motor response. Maximum strength elicited is 4/5. LUE and LLE with slow, laborious movements that reach maximum of 5/5 strength with coaching.  Sensory: Subjectively absent FT sensation to LUE and LLE. Subjectively decreased temp sensation to LUE and LLE.  Deep Tendon Reflexes:  2+ biceps, brachioradialis, patellae and achilles bilaterally.  Plantars: Right: downgoing   Left: downgoing Cerebellar: No ataxia with FNF bilaterally. Slow and laborious execution of movements with exertional affect.  Gait: Deferred  Results for orders placed or performed during the hospital encounter of 10/24/16 (from the past 48 hour(s))  Protime-INR     Status: None   Collection Time: 10/24/16 10:25 PM  Result Value Ref Range   Prothrombin Time 12.8 11.4 - 15.2 seconds   INR 0.97   APTT     Status: None   Collection Time: 10/24/16 10:25 PM  Result Value Ref Range   aPTT 32 24 - 36 seconds  CBC     Status: Abnormal   Collection Time: 10/24/16 10:25 PM  Result Value Ref Range   WBC 6.9 4.0 - 10.5 K/uL   RBC 3.88 3.87 - 5.11 MIL/uL   Hemoglobin 11.3 (L) 12.0 - 15.0 g/dL   HCT 34.9 (L) 36.0 - 46.0 %   MCV 89.9 78.0 - 100.0 fL   MCH 29.1 26.0 - 34.0 pg   MCHC 32.4 30.0  - 36.0 g/dL   RDW 13.7 11.5 - 15.5 %   Platelets 206 150 - 400 K/uL  Differential     Status: None   Collection Time: 10/24/16 10:25 PM  Result Value Ref Range   Neutrophils Relative % 44 %   Neutro Abs 3.1 1.7 - 7.7 K/uL   Lymphocytes Relative 44 %   Lymphs Abs 3.0 0.7 - 4.0 K/uL   Monocytes Relative 8 %   Monocytes Absolute 0.5 0.1 - 1.0 K/uL   Eosinophils Relative 4 %   Eosinophils Absolute 0.2 0.0 - 0.7 K/uL   Basophils Relative 0 %   Basophils Absolute 0.0 0.0 - 0.1 K/uL  Comprehensive metabolic panel     Status: Abnormal   Collection Time: 10/24/16 10:25 PM  Result Value Ref Range   Sodium 134 (L) 135 - 145 mmol/L   Potassium 3.9 3.5 - 5.1 mmol/L   Chloride 103 101 - 111 mmol/L   CO2 22 22 - 32 mmol/L   Glucose, Bld 113 (H) 65 - 99 mg/dL   BUN 26 (H) 6 - 20 mg/dL   Creatinine, Ser 1.25 (H) 0.44 - 1.00 mg/dL   Calcium 8.9 8.9 -  10.3 mg/dL   Total Protein 6.8 6.5 - 8.1 g/dL   Albumin 3.6 3.5 - 5.0 g/dL   AST 16 15 - 41 U/L   ALT 16 14 - 54 U/L   Alkaline Phosphatase 67 38 - 126 U/L   Total Bilirubin 0.4 0.3 - 1.2 mg/dL   GFR calc non Af Amer 50 (L) >60 mL/min   GFR calc Af Amer 58 (L) >60 mL/min    Comment: (NOTE) The eGFR has been calculated using the CKD EPI equation. This calculation has not been validated in all clinical situations. eGFR's persistently <60 mL/min signify possible Chronic Kidney Disease.    Anion gap 9 5 - 15  I-stat troponin, ED     Status: None   Collection Time: 10/24/16 10:40 PM  Result Value Ref Range   Troponin i, poc 0.01 0.00 - 0.08 ng/mL   Comment 3            Comment: Due to the release kinetics of cTnI, a negative result within the first hours of the onset of symptoms does not rule out myocardial infarction with certainty. If myocardial infarction is still suspected, repeat the test at appropriate intervals.   I-Stat Chem 8, ED     Status: Abnormal   Collection Time: 10/24/16 10:42 PM  Result Value Ref Range   Sodium 137 135  - 145 mmol/L   Potassium 3.8 3.5 - 5.1 mmol/L   Chloride 103 101 - 111 mmol/L   BUN 26 (H) 6 - 20 mg/dL   Creatinine, Ser 1.20 (H) 0.44 - 1.00 mg/dL   Glucose, Bld 108 (H) 65 - 99 mg/dL   Calcium, Ion 1.06 (L) 1.15 - 1.40 mmol/L   TCO2 23 0 - 100 mmol/L   Hemoglobin 11.2 (L) 12.0 - 15.0 g/dL   HCT 33.0 (L) 36.0 - 46.0 %   Ct Head Code Stroke W/o Cm  Result Date: 10/24/2016 CLINICAL DATA:  Code stroke. LEFT-sided weakness. History of stroke, hypertension and hyperlipidemia. EXAM: CT HEAD WITHOUT CONTRAST TECHNIQUE: Contiguous axial images were obtained from the base of the skull through the vertex without intravenous contrast. COMPARISON:  CT HEAD March 31, 2016 FINDINGS: BRAIN: No intraparenchymal hemorrhage, mass effect nor midline shift. Old LEFT basal ganglia/ corona radiata lacunar infarct with ex vacuo dilatation LEFT lateral ventricle. Old RIGHT basal ganglia/internal capsule lacunar infarct. Subcentimeter LEFT choroidal fissure cyst. No acute large vascular territory infarcts. No abnormal extra-axial fluid collections. Basal cisterns are patent. VASCULAR: Mild calcific atherosclerosis of the carotid siphon. SKULL/SOFT TISSUES: No skull fracture. No significant soft tissue swelling. ORBITS/SINUSES: The included ocular globes and orbital contents are normal.The mastoid aircells and included paranasal sinuses are well-aerated. OTHER: Patient is edentulous. ASPECTS [facility] Stroke Program Early CT Score) - Ganglionic level infarction (caudate, lentiform nuclei, internal capsule, insula, M1-M3 cortex): 7 - Supraganglionic infarction (M4-M6 cortex): 3 Total score (0-10 with 10 being normal): 10 IMPRESSION: 1. No acute intracranial process. Old bilateral basal ganglia/internal capsule lacunar infarcts. 2. ASPECTS is 10. Dr. Cheral Marker, Neurology paged via [place] hospital paging system on 10/24/2016 at 10:52 pm, awaiting return call. Electronically Signed   By: Elon Alas M.D.   On: 10/24/2016  22:56    Assessment: 46 y.o. female with acute onset of worsened left sided weakness. Developed loss of speech output in the ambulance en route to [facility].  1. Exam reveals left sided weakness with giveway. Speech pattern more consistent with embellishment/poor effort than aphasia. Although acute stroke is on DDx,  acute manifestation of a somatoform disorder is also relatively likely.  2. Discussed risks benefits of IV tPA with patient, including ICH with worsened disability in 2-5% of patients in exchange for an approximately 30% chance of improvement if given tPA. The patient expressed wish not to proceed with tPA, which was witnessed by nursing staff.  3. Stroke Risk Factors - HLD, HTN, prior stroke 4. CT head shows no acute findings. Old lacunar infarctions are noted.   Plan: 1. HgbA1c, fasting lipid panel 2. MRI, MRA of the brain without contrast 3. PT consult, OT consult, Speech consult 4. Echocardiogram 5. Carotid dopplers 6. Continue Plavix. May need to escalate antiplatelet therapy pending imaging work up results.  7. Permissive HTN x 24 hours.  8. Gentle IV hydration 9. Continue low-dose statin 10. Risk factor modification 11. Telemetry monitoring 12. Frequent neuro checks  _0  signed: Dr. Kerney Elbe  10/24/2016, 11:09 PM

## 2016-10-25 ENCOUNTER — Encounter (HOSPITAL_COMMUNITY): Payer: Medicaid Other

## 2016-10-25 ENCOUNTER — Encounter (HOSPITAL_COMMUNITY): Payer: Self-pay | Admitting: *Deleted

## 2016-10-25 ENCOUNTER — Observation Stay (HOSPITAL_COMMUNITY): Payer: Medicaid Other

## 2016-10-25 DIAGNOSIS — G8194 Hemiplegia, unspecified affecting left nondominant side: Secondary | ICD-10-CM

## 2016-10-25 DIAGNOSIS — I639 Cerebral infarction, unspecified: Secondary | ICD-10-CM | POA: Insufficient documentation

## 2016-10-25 HISTORY — DX: Cerebral infarction, unspecified: I63.9

## 2016-10-25 LAB — BASIC METABOLIC PANEL
Anion gap: 8 (ref 5–15)
BUN: 21 mg/dL — AB (ref 6–20)
CALCIUM: 8.7 mg/dL — AB (ref 8.9–10.3)
CHLORIDE: 106 mmol/L (ref 101–111)
CO2: 23 mmol/L (ref 22–32)
Creatinine, Ser: 1.03 mg/dL — ABNORMAL HIGH (ref 0.44–1.00)
GFR calc Af Amer: >60 mL/min (ref >60)
GFR calc non Af Amer: >60 mL/min (ref >60)
Glucose, Bld: 102 mg/dL — ABNORMAL HIGH (ref 65–99)
Potassium: 3.7 mmol/L (ref 3.5–5.1)
SODIUM: 137 mmol/L (ref 135–145)

## 2016-10-25 LAB — CBC
HCT: 34.2 % — ABNORMAL LOW (ref 36.0–46.0)
HEMOGLOBIN: 11 g/dL — AB (ref 12.0–15.0)
MCH: 28.9 pg (ref 26.0–34.0)
MCHC: 32.2 g/dL (ref 30.0–36.0)
MCV: 90 fL (ref 78.0–100.0)
Platelets: 191 10*3/uL (ref 150–400)
RBC: 3.8 MIL/uL — ABNORMAL LOW (ref 3.87–5.11)
RDW: 13.6 % (ref 11.5–15.5)
WBC: 6.7 10*3/uL (ref 4.0–10.5)

## 2016-10-25 LAB — URINALYSIS, COMPLETE (UACMP) WITH MICROSCOPIC
BILIRUBIN URINE: NEGATIVE
Glucose, UA: NEGATIVE mg/dL
Ketones, ur: NEGATIVE mg/dL
LEUKOCYTES UA: NEGATIVE
NITRITE: NEGATIVE
PH: 6 (ref 5.0–8.0)
Protein, ur: NEGATIVE mg/dL
SPECIFIC GRAVITY, URINE: 1.01 (ref 1.005–1.030)
WBC, UA: NONE SEEN WBC/hpf (ref 0–5)

## 2016-10-25 LAB — RAPID URINE DRUG SCREEN, HOSP PERFORMED
Amphetamines: NOT DETECTED
BARBITURATES: NOT DETECTED
Benzodiazepines: NOT DETECTED
COCAINE: NOT DETECTED
Opiates: NOT DETECTED
TETRAHYDROCANNABINOL: POSITIVE — AB

## 2016-10-25 LAB — LIPID PANEL
CHOL/HDL RATIO: 3.3 ratio
Cholesterol: 132 mg/dL (ref 0–200)
HDL: 40 mg/dL — AB (ref >40)
LDL CALC: 84 mg/dL (ref 0–99)
Triglycerides: 38 mg/dL (ref <150)
VLDL: 8 mg/dL (ref 0–40)

## 2016-10-25 LAB — TROPONIN I: Troponin I: <0.03 ng/mL (ref <0.03)

## 2016-10-25 LAB — HIV ANTIBODY (ROUTINE TESTING W REFLEX): HIV Screen 4th Generation wRfx: NONREACTIVE

## 2016-10-25 MED ORDER — ENOXAPARIN SODIUM 40 MG/0.4ML ~~LOC~~ SOLN
40.0000 mg | Freq: Every day | SUBCUTANEOUS | Status: DC
  Administered 2016-10-25 – 2016-10-26 (×2): 40 mg via SUBCUTANEOUS
  Filled 2016-10-25 (×2): qty 0.4

## 2016-10-25 MED ORDER — SENNOSIDES-DOCUSATE SODIUM 8.6-50 MG PO TABS: 1.0000 | ORAL_TABLET | Freq: Every evening | ORAL | Status: DC | PRN

## 2016-10-25 MED ORDER — GABAPENTIN 300 MG PO CAPS
300.0000 mg | ORAL_CAPSULE | Freq: Every day | ORAL | Status: DC
  Administered 2016-10-25: 300 mg via ORAL
  Filled 2016-10-25: qty 1

## 2016-10-25 MED ORDER — ATORVASTATIN CALCIUM 80 MG PO TABS
80.0000 mg | ORAL_TABLET | Freq: Every day | ORAL | Status: DC
  Administered 2016-10-25 – 2016-10-26 (×2): 80 mg via ORAL
  Filled 2016-10-25 (×3): qty 1

## 2016-10-25 MED ORDER — CLOPIDOGREL BISULFATE 75 MG PO TABS
75.0000 mg | ORAL_TABLET | Freq: Every day | ORAL | Status: DC
  Administered 2016-10-26: 75 mg via ORAL
  Filled 2016-10-25: qty 1

## 2016-10-25 MED ORDER — SODIUM CHLORIDE 0.9 % IV SOLN
INTRAVENOUS | Status: AC
  Administered 2016-10-25: 03:00:00 via INTRAVENOUS

## 2016-10-25 MED ORDER — ACETAMINOPHEN 325 MG PO TABS: 650.0000 mg | ORAL_TABLET | ORAL | Status: DC | PRN

## 2016-10-25 MED ORDER — ACETAMINOPHEN 650 MG RE SUPP: 650.0000 mg | RECTAL | Status: DC | PRN

## 2016-10-25 MED ORDER — ATORVASTATIN CALCIUM 40 MG PO TABS: 40.0000 mg | ORAL_TABLET | Freq: Every day | ORAL | Status: DC

## 2016-10-25 MED ORDER — ACETAMINOPHEN 160 MG/5ML PO SOLN
650.0000 mg | ORAL | Status: DC | PRN
Start: 2016-10-25 — End: 2016-10-26

## 2016-10-25 MED ORDER — LORAZEPAM 2 MG/ML IJ SOLN
1.0000 mg | Freq: Once | INTRAMUSCULAR | Status: AC | PRN
  Administered 2016-10-25: 1 mg via INTRAVENOUS
  Filled 2016-10-25: qty 1

## 2016-10-25 MED ORDER — VENLAFAXINE HCL ER 75 MG PO CP24
75.0000 mg | ORAL_CAPSULE | Freq: Every day | ORAL | Status: DC
  Administered 2016-10-26: 75 mg via ORAL
  Filled 2016-10-25: qty 1

## 2016-10-25 MED ORDER — STROKE: EARLY STAGES OF RECOVERY BOOK
Freq: Once | Status: AC
  Administered 2016-10-25: 07:00:00
  Filled 2016-10-25: qty 1

## 2016-10-25 MED ORDER — NICOTINE 14 MG/24HR TD PT24
14.0000 mg | MEDICATED_PATCH | Freq: Every day | TRANSDERMAL | Status: DC
  Administered 2016-10-25 – 2016-10-26 (×2): 14 mg via TRANSDERMAL
  Filled 2016-10-25 (×2): qty 1

## 2016-10-25 NOTE — Progress Notes (Signed)
STROKE TEAM PROGRESS NOTE   HISTORY OF PRESENT ILLNESS (per record) Brandy Roberts is an 46 y.o. female with prior history of stroke resulting in residual left sided weakness, who presents with acute worsening of her left sided weakness. Initially conversant with EMS; en route she stopped communicating. BP was 100/64 on scene. CBG 80. Home medications include Plavix and low-dose atorvastatin.   LSN: [id number] tPA Given: No: Patient refused tPA after being informed in detail of risks versus benefits   SUBJECTIVE (INTERVAL HISTORY) No family at bedside. She says she feels the same. Flat affect. Embellishment on weakness.   OBJECTIVE Temp:  [97.8 F (36.6 C)-98.6 F (37 C)] 98.6 F (37 C) (05/26 1407) Pulse Rate:  [50-65] 53 (05/26 1407) Cardiac Rhythm: Heart block (05/26 0700) Resp:  [13-26] 16 (05/26 1407) BP: (90-120)/(36-66) 105/49 (05/26 1407) SpO2:  [96 %-100 %] 99 % (05/26 1407) Weight:  [82.4 kg (181 lb 10.5 oz)] 82.4 kg (181 lb 10.5 oz) (05/25 2313)  CBC:   Recent Labs Lab 10/24/16 2225 10/24/16 2242 10/25/16 0645  WBC 6.9  --  6.7  NEUTROABS 3.1  --   --   HGB 11.3* 11.2* 11.0*  HCT 34.9* 33.0* 34.2*  MCV 89.9  --  90.0  PLT 206  --  102    Basic Metabolic Panel:   Recent Labs Lab 10/24/16 2225 10/24/16 2242 10/25/16 0645  NA 134* 137 137  K 3.9 3.8 3.7  CL 103 103 106  CO2 22  --  23  GLUCOSE 113* 108* 102*  BUN 26* 26* 21*  CREATININE 1.25* 1.20* 1.03*  CALCIUM 8.9  --  8.7*    Lipid Panel:     Component Value Date/Time   CHOL 132 10/25/2016 0248   TRIG 38 10/25/2016 0248   HDL 40 (L) 10/25/2016 0248   CHOLHDL 3.3 10/25/2016 0248   VLDL 8 10/25/2016 0248   LDLCALC 84 10/25/2016 0248   HgbA1c:  Lab Results  Component Value Date   HGBA1C 5.8 (H) 10/07/2014   Urine Drug Screen:     Component Value Date/Time   LABOPIA NONE DETECTED 10/25/2016 0900   COCAINSCRNUR NONE DETECTED 10/25/2016 0900   LABBENZ NONE DETECTED 10/25/2016 0900    AMPHETMU NONE DETECTED 10/25/2016 0900   THCU POSITIVE (A) 10/25/2016 0900   LABBARB NONE DETECTED 10/25/2016 0900    Alcohol Level     Component Value Date/Time   ETH <5 10/06/2014 1153    IMAGING  Ct Head Code Stroke W/o Cm 10/25/2016 1. No acute intracranial process. Old bilateral basal ganglia/internal capsule lacunar infarcts.  2. ASPECTS is 10.    MRI / MRA - pending     Physical exam: Exam: Gen: NAD, flat affect                    CV: RRR, no MRG. No Carotid Bruits. No peripheral edema, warm, nontender Eyes: Conjunctivae clear without exudates or hemorrhage  Neuro: Detailed Neurologic Exam  Speech:    Speech is normal; fluent and spontaneous with normal comprehension.  Cognition:    The patient is oriented to person, place, and month, year;     Cranial Nerves:    The pupils are equal, round, and reactive to light. Visual fields are full to threat. Extraocular movements are intact. Trigeminal sensation is intact and the muscles of mastication are normal. The face is symmetric. The palate elevates in the midline. Hearing intact. Voice is normal. Shoulder shrug is normal. The  tongue has normal motion without fasciculations.   Coordination:    Normal finger to nose right  Gait:    Can stand but not walk  Motor Observation:    No asymmetry, no atrophy, and no involuntary movements noted. Tone:    Normal muscle tone.    Posture:    Posture is normal. normal erect    Strength:    Strength is anti-gravity on the right with giveway.  The left is hemiplegic however arm is anti-gravity when distracted there is embellishment in weakness     Sensation: left hemisensory loss     Reflex Exam:  DTR's:    Deep tendon reflexes in the upper and lower extremities are symmetrical bilaterally.   Toes:    The toes are downgoing bilaterally.      ASSESSMENT/PLAN [name] is a 46 y.o. female with history of previous stroke, hypertension, anxiety,  depression, hyperlipidemia, and headaches  presenting with acute worsening of left-sided weakness and abnormal speech pattern.  She did not receive IV t-PA - patient declined.  Significant embellishment on exam and symptoms improve with distraction.  Possible TIA:  Await MRI results  Resultant  Exaggerated left-sided weakness  CT head - No acute intracranial process. Old bilateral basal ganglia/internal capsule lacunar infarcts.   MRI head pending  MRA head pending  Carotid Doppler  pending  2D Echo  pending  LDL - 84  HgbA1c pending  VTE prophylaxis - Lovenox Diet Heart Room service appropriate? Yes; Fluid consistency: Thin  clopidogrel 75 mg daily prior to admission, now on clopidogrel 75 mg daily  Patient counseled to be compliant with her antithrombotic medications  Ongoing aggressive stroke risk factor management  Therapy recommendations: Skilled nursing facility placement recommended for continued PT and OT.  Disposition:  Pending  Hypertension  Blood pressure tends to run low  Permissive hypertension (OK if < 220/120) but gradually normalize in 5-7 days  Long-term BP goal normotensive  Hyperlipidemia  Home meds: Lipitor 40 mg daily resumed in hospital  LDL 84, goal < 70  Increase Lipitor to 80 mg daily  Continue statin at discharge    Other Stroke Risk Factors  Former cigarette smoker - quit  Obesity, Body mass index is 29.32 kg/m., recommend weight loss, diet and exercise as appropriate   Hx stroke/TIA    Other Active Problems  UDS - positive for THC  Mild anemia  Mild renal insufficiency  Hospital day # 0  Personally examined patient and images, and have participated in and made any corrections needed to history, physical, neuro exam,assessment and plan as stated above.  I have personally obtained the history, evaluated lab date, reviewed imaging studies and agree with radiology interpretations.    Sarina Ill, MD Stroke  Neurology  To contact Stroke Continuity provider, please refer to [email or web address] After hours, contact General Neurology

## 2016-10-25 NOTE — H&P (Addendum)
History and Physical    Kaliya Shreiner IRW:[id number] DOB: October 31, 1970 DOA: 10/24/2016  PCP: [name], FNP   Patient coming from: Home  Chief Complaint:   HPI: Brandy Roberts is a 46 y.o. female with medical history significant of  prior CVA- 10/2014 with residual left side hemiparesis, OSA, hypertensive cardiomyopathy, tobacco abuse,  aortic regurgitation. She presented to the ED today with complaints of sudden worsening of left-sided weakness. Patient was standing in her kitchen cooking at about 10 PM, when suddenly had left-sided felt weak she almost fell to the floor but supported herself to have bed. Patient had a CVA 2016, and still has significant left-sided deficits, she uses a walker at baseline- her uncles walker as she left hers in a bus recently.  She also notes slurred speech this evening. ?facial asymmetry. Her uncle sleeping at the time. She also notes a headache that started at the same time. She has chronic urinary frequency- since last CVA. Denies bowel incontinence. No history of seizures. She is a current smoker. Patient endorses compliance with her medications which includes Plavix. At the time of my evaluation patient notes her sided weakness is worse when it started earlier today.   No chest pain, shortness of breath no cough, no fever , no dysuria, no nausea or vomiting but endorses poor appetite.  ED Course: Blood pressure 120/66, electrolytes unremarkable. CT head negative for acute intracranial process, old lacunar infarcts. Neurology was consulted in the ED. Patient refused TPA. There was also concern for somatoform disorder.  Review of Systems: As per HPI otherwise 10 point review of systems negative.   Past Medical History:  Diagnosis Date  . Headache   . Hyperlipidemia   . Hypertension   . Stroke [facility])     Past Surgical History:  Procedure Laterality Date  . CESAREAN SECTION    . GANGLION CYST EXCISION    . TEE WITHOUT CARDIOVERSION N/A  10/09/2014   Procedure: TRANSESOPHAGEAL ECHOCARDIOGRAM (TEE);  Surgeon: Sanda Klein, MD;  Location: [facility] ENDOSCOPY;  Service: Cardiovascular;  Laterality: N/A;  . TONSILLECTOMY       reports that she has quit smoking. She smoked 0.25 packs per day. She has never used smokeless tobacco. She reports that she does not drink alcohol or use drugs.  No Known Allergies  Family History  Problem Relation Age of Onset  . Cancer Mother   . Hypertension Mother   . Hypertension Sister     Prior to Admission medications   Medication Sig Start Date End Date Taking? Authorizing Provider  amLODipine (NORVASC) 10 MG tablet Take 0.5 tablets (5 mg total) by mouth daily. 03/31/16  Yes Kirichenko, Tatyana, PA-C  atorvastatin (LIPITOR) 10 MG tablet TAKE 1 TABLET BY MOUTH DAILY AT 6 PM 08/27/16  Yes Hairston, Mandesia R, FNP  bacitracin-neomycin-polymyxin-hydrocortisone (CORTISPORIN) 1 % ointment Apply 1 application topically 2 (two) times daily. Patient taking differently: Apply 1 application topically 2 (two) times daily as needed (rash).  07/02/16  Yes Hairston, Maylon Peppers, FNP  butalbital-acetaminophen-caffeine (FIORICET, ESGIC) 50-325-40 MG tablet Take 1 tablet by mouth every 6 (six) hours as needed for headache. 03/31/16  Yes Kirichenko, Tatyana, PA-C  clopidogrel (PLAVIX) 75 MG tablet Take 1 tablet (75 mg total) by mouth daily. 09/02/16  Yes Hairston, Maylon Peppers, FNP  gabapentin (NEURONTIN) 300 MG capsule Take 1 capsule (300 mg total) by mouth at bedtime. 04/04/16  Yes McClung, Levada Dy M, PA-C  hydrALAZINE (APRESOLINE) 50 MG tablet TAKE 1 TABLET BY MOUTH 3 TIMES  DAILY WITH MEALS. 10/02/16  Yes Hairston, Mandesia R, FNP  hydrochlorothiazide (HYDRODIURIL) 25 MG tablet TAKE 1 TABLET (25 MG TOTAL) BY MOUTH DAILY. 08/27/16  Yes Hairston, Maylon Peppers, FNP  ibuprofen (ADVIL,MOTRIN) 800 MG tablet Take 1 tablet (800 mg total) by mouth every 8 (eight) hours as needed. Patient taking differently: Take 800 mg by mouth every 8  (eight) hours as needed for moderate pain.  04/30/16  Yes Hairston, Mandesia R, FNP  lisinopril (PRINIVIL,ZESTRIL) 20 MG tablet TAKE 1 TABLET (20 MG TOTAL) BY MOUTH 2 (TWO) TIMES DAILY. 08/27/16  Yes Hairston, Maylon Peppers, FNP  metoprolol (LOPRESSOR) 50 MG tablet Take 1 tablet (50 mg total) by mouth 2 (two) times daily. 09/02/16  Yes [name], FNP  spironolactone (ALDACTONE) 50 MG tablet Take 1 tablet (50 mg total) by mouth daily. 04/04/16  Yes Argentina Donovan, PA-C  venlafaxine XR (EFFEXOR XR) 75 MG 24 hr capsule Take 1 capsule (75 mg total) by mouth daily with breakfast. 09/05/16  Yes Pieter Partridge, DO    Physical Exam: Vitals:   10/25/16 0000 10/25/16 0015 10/25/16 0030 10/25/16 0045  BP: 108/66 108/60 (!) 104/56 (!) 102/57  Pulse: 60 (!) 58 (!) 57 (!) 56  Resp: (!) 26 19 13 13   Temp:      TempSrc:      SpO2: 99% 98% 99% 97%  Weight:        Constitutional: NAD, calm, comfortable Vitals:   10/25/16 0000 10/25/16 0015 10/25/16 0030 10/25/16 0045  BP: 108/66 108/60 (!) 104/56 (!) 102/57  Pulse: 60 (!) 58 (!) 57 (!) 56  Resp: (!) 26 19 13 13   Temp:      TempSrc:      SpO2: 99% 98% 99% 97%  Weight:       General- history hard to obtain as patient is not putting effort into giving me a history, appears sleepy , but will respond to frequent and persistent prodding with questions Eyes: PERRL, lids and conjunctivae normal ENMT: Mucous membranes are moist.  Neck: normal, supple, no masses, no thyromegaly Respiratory: clear to auscultation bilaterally, no wheezing, no crackles. Normal respiratory effort. No accessory muscle use.  Cardiovascular: Regular rate and rhythm, 3/6 systolic murmur, No extremity edema. 2+ pedal pulses. No carotid bruits.  Abdomen: Suprapubic tenderness, no masses palpated. No hepatosplenomegaly. Bowel sounds positive.  Musculoskeletal: no clubbing / cyanosis. Reduced muscle tone- LUE, LLE.  Skin: no rashes, lesions, ulcers. No induration Neurologic: CN  2-12 grossly intact. Sensation reduced RUE, RLE, DTR-  hyperactive left, Strength 5/5- RLE, RUE 2/5- LLE,  LUE.[name]  Psychiatric: Normal judgment and insight. Alert and oriented x 3. Flat affect.   Labs on Admission: I have personally reviewed following labs and imaging studies  CBC:  Recent Labs Lab 10/24/16 2225 10/24/16 2242  WBC 6.9  --   NEUTROABS 3.1  --   HGB 11.3* 11.2*  HCT 34.9* 33.0*  MCV 89.9  --   PLT 206  --    Basic Metabolic Panel:  Recent Labs Lab 10/24/16 2225 10/24/16 2242  NA 134* 137  K 3.9 3.8  CL 103 103  CO2 22  --   GLUCOSE 113* 108*  BUN 26* 26*  CREATININE 1.25* 1.20*  CALCIUM 8.9  --    Liver Function Tests:  Recent Labs Lab 10/24/16 2225  AST 16  ALT 16  ALKPHOS 67  BILITOT 0.4  PROT 6.8  ALBUMIN 3.6   Coagulation Profile:  Recent Labs Lab  10/24/16 2225  INR 0.97   CBG:  Recent Labs Lab 10/24/16 2310  GLUCAP 100*   Urine analysis:    Component Value Date/Time   COLORURINE YELLOW 03/31/2016 1340   APPEARANCEUR HAZY (A) 03/31/2016 1340   LABSPEC 1.024 03/31/2016 1340   PHURINE 6.5 03/31/2016 1340   GLUCOSEU NEGATIVE 03/31/2016 1340   HGBUR NEGATIVE 03/31/2016 1340   BILIRUBINUR NEGATIVE 03/31/2016 1340   KETONESUR NEGATIVE 03/31/2016 1340   PROTEINUR 100 (A) 03/31/2016 1340   UROBILINOGEN 0.2 10/06/2014 1228   NITRITE NEGATIVE 03/31/2016 1340   LEUKOCYTESUR NEGATIVE 03/31/2016 1340    Radiological Exams on Admission: Ct Head Code Stroke W/o Cm  Addendum Date: 10/25/2016   ADDENDUM REPORT: 10/25/2016 00:56 ADDENDUM: Critical Value/emergent results were called by telephone at the time of interpretation on 10/24/2016 at 2309 hours to Dr. Cheral Marker, Neurology who verbally acknowledged these results. Electronically Signed   By: Elon Alas M.D.   On: 10/25/2016 00:56   Result Date: 10/25/2016 CLINICAL DATA:  Code stroke. LEFT-sided weakness. History of stroke, hypertension and hyperlipidemia. EXAM: CT HEAD WITHOUT  CONTRAST TECHNIQUE: Contiguous axial images were obtained from the base of the skull through the vertex without intravenous contrast. COMPARISON:  CT HEAD March 31, 2016 FINDINGS: BRAIN: No intraparenchymal hemorrhage, mass effect nor midline shift. Old LEFT basal ganglia/ corona radiata lacunar infarct with ex vacuo dilatation LEFT lateral ventricle. Old RIGHT basal ganglia/internal capsule lacunar infarct. Subcentimeter LEFT choroidal fissure cyst. No acute large vascular territory infarcts. No abnormal extra-axial fluid collections. Basal cisterns are patent. VASCULAR: Mild calcific atherosclerosis of the carotid siphon. SKULL/SOFT TISSUES: No skull fracture. No significant soft tissue swelling. ORBITS/SINUSES: The included ocular globes and orbital contents are normal.The mastoid aircells and included paranasal sinuses are well-aerated. OTHER: Patient is edentulous. ASPECTS [facility] Stroke Program Early CT Score) - Ganglionic level infarction (caudate, lentiform nuclei, internal capsule, insula, M1-M3 cortex): 7 - Supraganglionic infarction (M4-M6 cortex): 3 Total score (0-10 with 10 being normal): 10 IMPRESSION: 1. No acute intracranial process. Old bilateral basal ganglia/internal capsule lacunar infarcts. 2. ASPECTS is 10. Dr. Cheral Marker, Neurology paged via [place] [facility] paging system on 10/24/2016 at 10:52 pm, awaiting return call. Electronically Signed: By: Elon Alas M.D. On: 10/24/2016 22:56    EKG: Still pending.  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 Head CT negative for acute abnormality. Declined TPA. Neurology evaluation- Exam reveals left sided weakness with giveway. Speech  pattern more consistent with embellishment/poor effort than aphasia. Although acute stroke is on DDx, acute manifestation of a somatoform disorder is also relatively likely - Admits to telemetry obs - Nothing by mouth - Bedside swallow evaluation - PT OT speech evaluation - Hold BP meds, allow for permissive hypertension in the setting of possible new CVA, perX24 hrs - Neurology recommendations appreciated - MRI, MRA - Echo - carotid Doppler - Continue Plavix - Lipid panel, Hgba1c a.m. - UDS - Encourage smoking cessation -  Gentle hydration N/s 100 cc/hr for 12hrs. - trop x1  Malaise- with poor appetite, soft blood pressure in pt with hx of significant HTN, suprapubic pain, frequency, mild AKI. If MRI- negative for new CVA, possibly infection and hypotension causing generalized weakness. - UA - Blood cultures -  Hydrate.  AKI- Mild- Cr- 1.2. Baseline- 0.8-1. Possibly NSAIDS contributing. - Hydrate X 12 hrs.  - BMP am  Aortic regurgitation - echo 02/2015- moderate regurg, EF- 70-75%, G1DD.  TEE- 10/2014- moderate to severe regurgitation, EF- 40-45%, diffuse hypokinesis.   Dyslipidemia- home medication atorvastatin 10 mg daily. - Lipid panel a.m.  Hypertension- blood pressure on the low side. Hold home BP medications- Norvasc 10, hydralazine 50 TID, HCTZ 25, lisinopril 20, metoprolol 50 BID, to allow for permissive.   Bradycardia- mild, not new. Rates in the high 50s to low 60s. - Monitor  DVT prophylaxis: Lovenox   Code Status:  full  Family Communication:  none at bedside  Disposition Plan:  Pending PT evaluation  Consults called:  neurology  Admission status:  telemetry  Abdulrahman Bracey Arlyce Dice MD [facility] Pager [phone]  If 2am-7AM, please contact night-coverage [email or web address] Password [email or web address]  10/25/2016, 1:28 AM

## 2016-10-25 NOTE — Progress Notes (Signed)
10/24/2016 10:31 PM  10/25/2016 10:16 AM  Brandy Roberts was seen and examined.  The H&P by the admitting provider, orders, imaging was reviewed.  Please see orders.  Will continue to follow.   Murvin Natal, MD [facility]

## 2016-10-25 NOTE — ED Notes (Signed)
Pt made aware of bed assignment 

## 2016-10-25 NOTE — ED Notes (Signed)
Report attempted 

## 2016-10-25 NOTE — Evaluation (Signed)
Occupational Therapy Evaluation Patient Details Name: Brandy Roberts MRN: [id number] DOB: May 27, 1971 Today's Date: 10/25/2016    History of Present Illness Pt is a 46 y/o female admitted secondary to worsening L sided weakness. CT was negative and MRI is pending. PMH including but not limited to HTN, HLD and hx of CVA in 2016.   Clinical Impression   [name] presents to acute OT with deficits below (see OT problem list) thus affecting her PLOF of Mod I with all basic ADLs (except for LBD). She will benefit from acute OT with follow up OT at SNF.       Follow Up Recommendations  SNF;Supervision/Assistance - 24 hour    Equipment Recommendations  Other (comment) (TBD at next venue)       Precautions / Restrictions Precautions Precautions: Fall Restrictions Weight Bearing Restrictions: No      Mobility Bed Mobility Overal bed mobility: Needs Assistance Bed Mobility: Rolling;Supine to Sit Rolling: Mod assist (use of rail)   Supine to sit: Mod assist;HOB elevated (use of rail)     General bed mobility comments: increased time and effort, use of bed rails with R UE to roll, mod A to move L LE and elevate trunk to achieve sitting EOB  Transfers Overall transfer level: Needs assistance Equipment used:  (1 person in front of her with use of gait belt; +2 for pivot) Transfers: Sit to/from [facility] Sit to Stand: Mod assist Stand pivot transfers: Mod assist;+2 physical assistance       General transfer comment: Left knee not buckling in standing, but pt with not wanting to move LLE; A'd with weight shifts     Balance Overall balance assessment: Needs assistance Sitting-balance support: Feet supported;Bilateral upper extremity supported Sitting balance-Leahy Scale: Poor Sitting balance - Comments: pt required Bil UE support on bed   Standing balance support: Single extremity supported Standing balance-Leahy Scale:  Poor Standing balance comment: requires max A                           ADL either performed or assessed with clinical judgement   ADL Overall ADL's : Needs assistance/impaired Eating/Feeding: NPO   Grooming: Moderate assistance (supported sitting)   Upper Body Bathing: Moderate assistance (supported sitting)   Lower Body Bathing: Total assistance Lower Body Bathing Details (indicate cue type and reason): with Mod A sit<>stand Upper Body Dressing : Maximal assistance Upper Body Dressing Details (indicate cue type and reason): supported sitting Lower Body Dressing: Total assistance Lower Body Dressing Details (indicate cue type and reason): mod A sit<>stand Toilet Transfer: Moderate assistance;+2 for physical assistance;Stand-pivot Toilet Transfer Details (indicate cue type and reason): bed>recliner going to pt's left Toileting- Clothing Manipulation and Hygiene: Total assistance Toileting - Clothing Manipulation Details (indicate cue type and reason): Mod A with standing             Vision Baseline Vision/History: Wears glasses Wears Glasses: At all times Vision Assessment?: Yes Eye Alignment: Within Functional Limits Ocular Range of Motion:  (pt has issues with gaze stability with tracking) Alignment/Gaze Preference: Within Defined Limits Additional Comments: Pt reports from last stroke that she had to get glasses and that she had problems seeing to the left            Pertinent Vitals/Pain Pain Assessment: No/denies pain     Hand Dominance Right   Extremity/Trunk Assessment Upper Extremity Assessment Upper Extremity Assessment: LUE deficits/detail LUE  Deficits / Details: pt presents as if she is flaccid when tested, however spontaneous movements of arm noted when asked her about a scar on her arm and when she sat up on EOB she automatically put her LUE down to support herself. LUE Sensation:  (pt did not flinch to nail bed pressure on left) LUE  Coordination: decreased fine motor;decreased gross motor         Communication Communication Communication:  (low tone)   Cognition Arousal/Alertness: Awake/alert Behavior During Therapy: Flat affect Overall Cognitive Status: Impaired/Different from baseline Area of Impairment: Safety/judgement;Following commands;Problem solving                       Following Commands: Follows one step commands inconsistently Safety/Judgement: Decreased awareness of safety   Problem Solving: Slow processing;Decreased initiation;Difficulty sequencing;Requires verbal cues;Requires tactile cues                Home Living Family/patient expects to be discharged to:: Private residence Living Arrangements:  Brandy Roberts) Available Help at Discharge: Family;Available 24 hours/day Type of Home: House Home Access: Level entry     Home Layout: One level     Bathroom Shower/Tub: Tub/shower unit;Curtain   Biochemist, clinical: Standard     Home Equipment: Hand held shower head;Grab bars - tub/shower   Additional Comments: had a RW but left it on the bus when coming back from visiting Brandy Roberts      Prior Functioning/Environment Level of Independence: Needs assistance  Gait / Transfers Assistance Needed: ambulates with RW ADL's / Homemaking Assistance Needed: needs assistance from uncle for LB dressing            OT Problem List: Decreased strength;Decreased range of motion;Impaired balance (sitting and/or standing);Impaired vision/perception;Impaired UE functional use;Impaired sensation      OT Treatment/Interventions: Self-care/ADL training;Therapeutic activities;Visual/perceptual remediation/compensation;Patient/family education;Balance training;DME and/or AE instruction    OT Goals(Current goals can be found in the care plan section) Acute Rehab OT Goals Patient Stated Goal: return to standing and walking OT Goal Formulation: With patient Time For Goal Achievement:  11/08/16 Potential to Achieve Goals: Good  OT Frequency: Min 3X/week              AM-PAC PT "6 Clicks" Daily Activity     Outcome Measure Help from another person eating meals?: A Little Help from another person taking care of personal grooming?: A Lot Help from another person toileting, which includes using toliet, bedpan, or urinal?: A Lot Help from another person bathing (including washing, rinsing, drying)?: A Lot Help from another person to put on and taking off regular upper body clothing?: A Lot Help from another person to put on and taking off regular lower body clothing?: A Lot 6 Click Score: 13   End of Session Equipment Utilized During Treatment: Gait belt Nurse Communication: Mobility status (+2 mod A stand pivot)  Activity Tolerance: Patient tolerated treatment well Patient left: in chair;with chair alarm set  OT Visit Diagnosis: Unsteadiness on feet (R26.81);Other abnormalities of gait and mobility (R26.89);Muscle weakness (generalized) (M62.81);Hemiplegia and hemiparesis Hemiplegia - Right/Left: Left Hemiplegia - dominant/non-dominant: Non-Dominant                Time: 0814-4818 OT Time Calculation (min): 23 min Charges:  OT General Charges $OT Visit: 1 Procedure OT Evaluation $OT Eval Moderate Complexity: 1 Procedure OT Treatments $Self Care/Home Management : 8-22 mins G-Codes: OT G-codes **NOT FOR INPATIENT CLASS** Functional Limitation: Self care Self Care Current Status (H6314): At least 80  percent but less than 100 percent impaired, limited or restricted Self Care Goal Status (J0932): At least 40 percent but less than 60 percent impaired, limited or restricted   [address], OTR/L [phone] 10/25/2016

## 2016-10-25 NOTE — Evaluation (Signed)
Physical Therapy Evaluation Patient Details Name: Brandy Roberts MRN: [id number] DOB: 07-Dec-1970 Today's Date: 10/25/2016   History of Present Illness  Pt is a 46 y/o female admitted secondary to worsening L sided weakness. CT was negative and MRI is pending. PMH including but not limited to HTN, HLD and hx of CVA in 2016.  Clinical Impression  Pt presented supine in bed with HOB elevated, awake and willing to participate in therapy session. Prior to admission, pt reported that she ambulated with a RW and required assistance from her son for LB dressing. During evaluation, pt required mod A for bed mobility and max A to stand, but unable to achieve full erect position as pt would not release R UE support on bed. Pt would continue to benefit from skilled physical therapy services at this time while admitted and after d/c to address the below listed limitations in order to improve overall safety and independence with functional mobility.     Follow Up Recommendations SNF;Supervision/Assistance - 24 hour    Equipment Recommendations  Rolling walker with 5" wheels    Recommendations for Other Services       Precautions / Restrictions Precautions Precautions: Fall Restrictions Weight Bearing Restrictions: No      Mobility  Bed Mobility Overal bed mobility: Needs Assistance Bed Mobility: Supine to Sit     Supine to sit: Mod assist;HOB elevated     General bed mobility comments: increased time and effort, use of bed rails with R UE, mod A to move L LE and elevate trunk to achieve sitting EOB  Transfers Overall transfer level: Needs assistance Equipment used: 1 person hand held assist (attempted RW at pt's request but did not actually use it) Transfers: Sit to/from Stand Sit to Stand: Max assist         General transfer comment: vc'ing for sequencing and technique, max A with therapist blocking L knee to avoid buckling. pt unable to achieve full erect standing position as  she would not remove her R hand from the bed secondary to anxiety/fear  Ambulation/Gait                Stairs            Wheelchair Mobility    Modified Rankin (Stroke Patients Only) Modified Rankin (Stroke Patients Only) Pre-Morbid Rankin Score: Moderately severe disability Modified Rankin: Moderately severe disability     Balance Overall balance assessment: Needs assistance Sitting-balance support: Feet supported Sitting balance-Leahy Scale: Poor Sitting balance - Comments: pt required R UE support on bed   Standing balance support: During functional activity Standing balance-Leahy Scale: Poor Standing balance comment: requires max A                             Pertinent Vitals/Pain Pain Assessment: No/denies pain    Home Living Family/patient expects to be discharged to:: Private residence Living Arrangements: Children Available Help at Discharge: Family;Available 24 hours/day Type of Home: House Home Access: Level entry     Home Layout: One level Home Equipment: None Additional Comments: had a RW but left it on the bus when coming back from visiting [place]    Prior Function Level of Independence: Needs assistance   Gait / Transfers Assistance Needed: ambulates with RW  ADL's / Homemaking Assistance Needed: needs assistance from son for LB dressing        Hand Dominance   Dominant Hand: Right    Extremity/Trunk Assessment  Upper Extremity Assessment Upper Extremity Assessment: Defer to OT evaluation    Lower Extremity Assessment Lower Extremity Assessment: LLE deficits/detail LLE Deficits / Details: Pt unable to perform any active movement at any joint on any plane of motion during evaluation. Pt also unable to move LE during functional bed mobility and required mod A with L LE. Pt also reporting no sensation to light touch throughout L LE.  LLE Sensation: decreased light touch LLE Coordination: decreased gross motor        Communication   Communication: No difficulties  Cognition Arousal/Alertness: Awake/alert Behavior During Therapy: Flat affect Overall Cognitive Status: Impaired/Different from baseline Area of Impairment: Safety/judgement;Problem solving;Following commands                       Following Commands: Follows one step commands consistently;Follows one step commands with increased time Safety/Judgement: Decreased awareness of safety;Decreased awareness of deficits   Problem Solving: Slow processing;Decreased initiation;Difficulty sequencing;Requires verbal cues;Requires tactile cues        General Comments      Exercises     Assessment/Plan    PT Assessment Patient needs continued PT services  PT Problem List Decreased strength;Decreased activity tolerance;Decreased balance;Decreased mobility;Decreased coordination;Decreased cognition;Decreased knowledge of use of DME;Decreased safety awareness;Impaired sensation       PT Treatment Interventions DME instruction;Gait training;Stair training;Functional mobility training;Therapeutic exercise;Therapeutic activities;Balance training;Neuromuscular re-education;Patient/family education;Cognitive remediation    PT Goals (Current goals can be found in the Care Plan section)  Acute Rehab PT Goals Patient Stated Goal: return to standing and walking PT Goal Formulation: With patient Time For Goal Achievement: 11/08/16 Potential to Achieve Goals: Fair    Frequency Min 4X/week   Barriers to discharge        Co-evaluation               AM-PAC PT "6 Clicks" Daily Activity  Outcome Measure Difficulty turning over in bed (including adjusting bedclothes, sheets and blankets)?: A Lot Difficulty moving from lying on back to sitting on the side of the bed? : Total Difficulty sitting down on and standing up from a chair with arms (e.g., wheelchair, bedside commode, etc,.)?: Total Help needed moving to and from a bed to chair  (including a wheelchair)?: A Lot Help needed walking in hospital room?: Total Help needed climbing 3-5 steps with a railing? : Total 6 Click Score: 8    End of Session Equipment Utilized During Treatment: Gait belt Activity Tolerance: Patient tolerated treatment well Patient left: in bed;with call bell/phone within reach;Other (comment) (MD in room with pt sitting EOB) Nurse Communication: Mobility status PT Visit Diagnosis: Other abnormalities of gait and mobility (R26.89);Other symptoms and signs involving the nervous system (R29.898)    Time: 5102-5852 PT Time Calculation (min) (ACUTE ONLY): 25 min   Charges:   PT Evaluation $PT Eval Moderate Complexity: 1 Procedure PT Treatments $Therapeutic Activity: 8-22 mins   PT G Codes:   PT G-Codes **NOT FOR INPATIENT CLASS** Functional Assessment Tool Used: AM-PAC 6 Clicks Basic Mobility;Clinical judgement Functional Limitation: Mobility: Walking and moving around Mobility: Walking and Moving Around Current Status (D7824): At least 80 percent but less than 100 percent impaired, limited or restricted Mobility: Walking and Moving Around Goal Status [phone]): At least 20 percent but less than 40 percent impaired, limited or restricted    [facility], [place], DPT [place] 10/25/2016, 10:20 AM

## 2016-10-25 NOTE — Evaluation (Signed)
Clinical/Bedside Swallow Evaluation Patient Details  Name: Brandy Roberts MRN: [id number] Date of Birth: 08/12/1970  Today's Date: 10/25/2016 Time: SLP Start Time (ACUTE ONLY): 1015 SLP Stop Time (ACUTE ONLY): 1030 SLP Time Calculation (min) (ACUTE ONLY): 15 min  Past Medical History:  Past Medical History:  Diagnosis Date  . Headache   . Hyperlipidemia   . Hypertension   . Stroke [facility])    Past Surgical History:  Past Surgical History:  Procedure Laterality Date  . CESAREAN SECTION    . GANGLION CYST EXCISION    . TEE WITHOUT CARDIOVERSION N/A 10/09/2014   Procedure: TRANSESOPHAGEAL ECHOCARDIOGRAM (TEE);  Surgeon: Sanda Klein, MD;  Location: [facility] ENDOSCOPY;  Service: Cardiovascular;  Laterality: N/A;  . TONSILLECTOMY     HPI:  Brandy Roberts is a 46 y.o. female with medical history significant of  prior CVA- 10/2014 with residual left side hemiparesis, OSA, hypertensive cardiomyopathy, tobacco abuse, aortic regurgitation. She presented to the ED with complaints of sudden worsening of left-sided weakness. Patient had a CVA 2016, and still has significant left-sided deficits, she uses a walker at baseline. Following prior CVA pt seen by SLP, most recent MBS in 2016 with findings of mild oral dysphagia with poor transit of barium tablet, recommendations for regular diet with thin liquids, medications crushed or in liquid form. OP SLP for swallowing precautions and cognition. CT Head 10/24/16 negative for acute findings, MRI pending.   Assessment / Plan / Recommendation Clinical Impression  Patient presents with mild-moderate risk for aspiration given prior CVA, history of dysphagia and cognitive impairment. Pt is alert and following all basic commands, however noted with flat affect, reduced initiation and reasoning. Per charting pt refused tPA, she is stating "I don't want an MRI" despite acknowledging changes in her speech and weakness. Pt with reduced vocal amplitude, weak cough,  though unclear whether this is due to physical impairment or reduced effort/participation. Following commands for oral motor examination with slow, weak movements, more pronounced left-sided deficits. With initial swallows of thin liquids, pt presented with subtle delayed throat clear. As testing progressed, pt swallowed multiple consecutive boluses of thin liquid via cup and straw, no overt signs of aspiration with repeated challenging. Oral preparation, control and clearance adequate for solids, though pt does require extended time for mastication given missing dentition. Recommend initiating regular diet with thin liquids, medications whole with liquid. SLP will f/u briefly for tolerance and cognitive-linguistic assessment.   SLP Visit Diagnosis: Dysphagia, unspecified (R13.10)    Aspiration Risk  Mild aspiration risk    Diet Recommendation Regular;Thin liquid   Liquid Administration via: Cup;Straw Medication Administration: Whole meds with liquid Supervision: Patient able to self feed Compensations: Minimize environmental distractions;Slow rate;Small sips/bites    Other  Recommendations Oral Care Recommendations: Oral care BID   Follow up Recommendations Other (comment) (TBD)      Frequency and Duration min 1 x/week  1 week       Prognosis Prognosis for Safe Diet Advancement: Good      Swallow Study   General Date of Onset: 10/24/16 HPI: Brandy Roberts is a 46 y.o. female with medical history significant of  prior CVA- 10/2014 with residual left side hemiparesis, OSA, hypertensive cardiomyopathy, tobacco abuse, aortic regurgitation. She presented to the ED with complaints of sudden worsening of left-sided weakness. Patient had a CVA 2016, and still has significant left-sided deficits, she uses a walker at baseline. Following prior CVA pt seen by SLP, most recent MBS in 2016 with findings of mild oral  dysphagia with poor transit of barium tablet, recommendations for regular diet  with thin liquids, medications crushed or in liquid form. OP SLP for swallowing precautions and cognition. CT Head 10/24/16 negative for acute findings, MRI pending. Type of Study: Bedside Swallow Evaluation Previous Swallow Assessment: see HPI Diet Prior to this Study: NPO Temperature Spikes Noted: No Respiratory Status: Room air History of Recent Intubation: No Behavior/Cognition: Alert;Cooperative Oral Cavity Assessment: Within Functional Limits Oral Care Completed by SLP: No Oral Cavity - Dentition: Missing dentition Vision: Functional for self-feeding Self-Feeding Abilities: Able to feed self Patient Positioning: Upright in chair Baseline Vocal Quality: Low vocal intensity Volitional Cough: Weak Volitional Swallow: Able to elicit    Oral/Motor/Sensory Function Overall Oral Motor/Sensory Function: Moderate impairment Facial ROM: Reduced left;Suspected CN VII (facial) dysfunction Facial Symmetry: Abnormal symmetry left;Suspected CN VII (facial) dysfunction Facial Strength: Reduced left;Suspected CN VII (facial) dysfunction Facial Sensation: Reduced left;Suspected CN V (Trigeminal) dysfunction Lingual ROM: Within Functional Limits Lingual Symmetry: Within Functional Limits Lingual Strength: Reduced;Suspected CN XII (hypoglossal) dysfunction Lingual Sensation: Within Functional Limits Velum: Within Functional Limits Mandible: Within Functional Limits   Ice Chips Ice chips: Within functional limits   Thin Liquid Thin Liquid: Impaired Presentation: Cup;Self Fed;Straw Pharyngeal  Phase Impairments: Throat Clearing - Delayed    Nectar Thick Nectar Thick Liquid: Not tested   Honey Thick Honey Thick Liquid: Not tested   Puree Puree: Within functional limits Presentation: Spoon   Solid   GO   Solid: Impaired Presentation: Self Fed Oral Phase Impairments: Impaired mastication Oral Phase Functional Implications: Impaired mastication    Functional Assessment Tool Used: skilled  clinical judgment Functional Limitations: Swallowing Swallow Current Status (J6283): At least 1 percent but less than 20 percent impaired, limited or restricted Swallow Goal Status [phone]): At least 1 percent but less than 20 percent impaired, limited or restricted   Aliene Altes 10/25/2016,1:34 PM  Deneise Lever, [place], [place] Speech-Language Pathologist [phone]

## 2016-10-26 ENCOUNTER — Encounter (HOSPITAL_COMMUNITY): Payer: Medicaid Other

## 2016-10-26 DIAGNOSIS — G8194 Hemiplegia, unspecified affecting left nondominant side: Secondary | ICD-10-CM | POA: Diagnosis not present

## 2016-10-26 DIAGNOSIS — E785 Hyperlipidemia, unspecified: Secondary | ICD-10-CM | POA: Diagnosis not present

## 2016-10-26 DIAGNOSIS — F17200 Nicotine dependence, unspecified, uncomplicated: Secondary | ICD-10-CM | POA: Diagnosis not present

## 2016-10-26 DIAGNOSIS — M545 Low back pain: Secondary | ICD-10-CM

## 2016-10-26 DIAGNOSIS — I119 Hypertensive heart disease without heart failure: Secondary | ICD-10-CM | POA: Diagnosis not present

## 2016-10-26 DIAGNOSIS — I43 Cardiomyopathy in diseases classified elsewhere: Secondary | ICD-10-CM | POA: Diagnosis not present

## 2016-10-26 LAB — CBC WITH DIFFERENTIAL/PLATELET
BASOS ABS: 0 10*3/uL (ref 0.0–0.1)
Basophils Relative: 0 %
EOS ABS: 0.2 10*3/uL (ref 0.0–0.7)
Eosinophils Relative: 3 %
HCT: 33.1 % — ABNORMAL LOW (ref 36.0–46.0)
HEMOGLOBIN: 10.6 g/dL — AB (ref 12.0–15.0)
LYMPHS ABS: 2.7 10*3/uL (ref 0.7–4.0)
LYMPHS PCT: 52 %
MCH: 29 pg (ref 26.0–34.0)
MCHC: 32 g/dL (ref 30.0–36.0)
MCV: 90.7 fL (ref 78.0–100.0)
Monocytes Absolute: 0.5 10*3/uL (ref 0.1–1.0)
Monocytes Relative: 9 %
NEUTROS PCT: 36 %
Neutro Abs: 1.9 10*3/uL (ref 1.7–7.7)
PLATELETS: 185 10*3/uL (ref 150–400)
RBC: 3.65 MIL/uL — AB (ref 3.87–5.11)
RDW: 13.6 % (ref 11.5–15.5)
WBC: 5.3 10*3/uL (ref 4.0–10.5)

## 2016-10-26 LAB — COMPREHENSIVE METABOLIC PANEL
ALT: 16 U/L (ref 14–54)
AST: 15 U/L (ref 15–41)
Albumin: 3.3 g/dL — ABNORMAL LOW (ref 3.5–5.0)
Alkaline Phosphatase: 60 U/L (ref 38–126)
Anion gap: 4 — ABNORMAL LOW (ref 5–15)
BUN: 23 mg/dL — AB (ref 6–20)
CALCIUM: 8.6 mg/dL — AB (ref 8.9–10.3)
CHLORIDE: 106 mmol/L (ref 101–111)
CO2: 27 mmol/L (ref 22–32)
CREATININE: 1.08 mg/dL — AB (ref 0.44–1.00)
GFR calc non Af Amer: >60 mL/min (ref >60)
Glucose, Bld: 104 mg/dL — ABNORMAL HIGH (ref 65–99)
Potassium: 3.9 mmol/L (ref 3.5–5.1)
SODIUM: 137 mmol/L (ref 135–145)
Total Bilirubin: 0.1 mg/dL — ABNORMAL LOW (ref 0.3–1.2)
Total Protein: 6.4 g/dL — ABNORMAL LOW (ref 6.5–8.1)

## 2016-10-26 LAB — HEMOGLOBIN A1C
Hgb A1c MFr Bld: 5.7 % — ABNORMAL HIGH (ref 4.8–5.6)
Mean Plasma Glucose: 117 mg/dL

## 2016-10-26 MED ORDER — AZITHROMYCIN 250 MG PO TABS: 250.0000 mg | ORAL_TABLET | Freq: Every day | ORAL | Status: DC

## 2016-10-26 MED ORDER — ATORVASTATIN CALCIUM 80 MG PO TABS
80.0000 mg | ORAL_TABLET | Freq: Every day | ORAL | 0 refills | Status: DC
Start: 2016-10-26 — End: 2016-11-10

## 2016-10-26 MED ORDER — FLUTICASONE PROPIONATE 50 MCG/ACT NA SUSP: 2.0000 | Freq: Every day | NASAL | 0 refills | Status: DC

## 2016-10-26 MED ORDER — FLUTICASONE PROPIONATE 50 MCG/ACT NA SUSP
2.0000 | Freq: Every day | NASAL | Status: DC
  Administered 2016-10-26: 2 via NASAL
  Filled 2016-10-26: qty 16

## 2016-10-26 MED ORDER — OXYMETAZOLINE HCL 0.05 % NA SOLN
1.0000 | Freq: Two times a day (BID) | NASAL | Status: DC
  Administered 2016-10-26: 1 via NASAL
  Filled 2016-10-26: qty 15

## 2016-10-26 MED ORDER — AZITHROMYCIN 250 MG PO TABS: 250.0000 mg | ORAL_TABLET | Freq: Every day | ORAL | 0 refills | Status: AC

## 2016-10-26 MED ORDER — SPIRONOLACTONE 25 MG PO TABS: 25.0000 mg | ORAL_TABLET | Freq: Every day | ORAL | 0 refills | Status: DC

## 2016-10-26 MED ORDER — AZITHROMYCIN 500 MG PO TABS
500.0000 mg | ORAL_TABLET | Freq: Every day | ORAL | Status: AC
  Administered 2016-10-26: 500 mg via ORAL
  Filled 2016-10-26: qty 1

## 2016-10-26 MED ORDER — MUSCLE RUB 10-15 % EX CREA
TOPICAL_CREAM | CUTANEOUS | Status: DC | PRN
  Filled 2016-10-26: qty 85

## 2016-10-26 NOTE — Progress Notes (Signed)
PROGRESS NOTE    Brandy Roberts  XBW:[id number]  DOB: 1970-08-29  DOA: 10/24/2016 PCP: [name], FNP Outpatient Specialists:   Brief Admission Hx: Leota Maka an 46 y.o.femalewith prior history of stroke resulting in residual left sided weakness, who presents with acute worsening of her left sided weakness. Initially conversant with EMS; en route she stopped communicating. BP was 100/64 on scene. CBG 80. Home medications include Plavix and low-dose atorvastatin. Patient refused tPA after being informed in detail of risks versus benefits  Assessment & Plan:   Acute worsening of left hemiparesis - MRI with no acute findings.  PT/OT recommending SNF. Consult to Education officer, museum for placement.  Hypertension - blood pressures stable and controlled.  Dyslipidemia - atorvastatin increased to 80 mg daily.  Bradycardia - stable, asymptomatic.  Left ear pain - likely secondary to swollen nasal turbinates, added flonase, afrin, zithromax z pack.  Low back pain - treat with kpad, topical muscle creme.   DVT prophylaxis: Lovenox   Code Status:  full  Family Communication:  bedside  Disposition Plan:  SNF  Consults called:  neurology  Admission status:  telemetry  Consultants:  neurology  Subjective: Pt c/o low back muscle pain.   Objective: Vitals:   10/25/16 2233 10/26/16 0148 10/26/16 0450 10/26/16 0926  BP: (!) 122/58 119/69 126/70 140/68  Pulse: (!) 54 65 64 74  Resp: 17 15 20 20   Temp: 99 F (37.2 C) 97.5 F (36.4 C) 98.2 F (36.8 C) 98.4 F (36.9 C)  TempSrc: Oral Oral Oral Oral  SpO2: 98% 96% 97% 98%  Weight: 84.6 kg (186 lb 8.2 oz)     Height: 5\' 6"  (1.676 m)       Intake/Output Summary (Last 24 hours) at 10/26/16 1034 Last data filed at 10/26/16 0450  Gross per 24 hour  Intake              480 ml  Output                0 ml  Net              480 ml   Filed Weights   10/24/16 2200 10/24/16 2313 10/25/16 2233  Weight: 82.4 kg (181 lb  10.5 oz) 82.4 kg (181 lb 10.5 oz) 84.6 kg (186 lb 8.2 oz)    Exam:  General exam: awake alert sitting up in chair. NAD.  Cooperative.   Respiratory system: Clear. No increased work of breathing. Cardiovascular system: S1 & S2 heard, RRR. No JVD, murmurs, gallops, clicks or pedal edema. Gastrointestinal system: Abdomen is nondistended, soft and nontender. Normal bowel sounds heard. Central nervous system: Alert and oriented. No focal neurological deficits. Extremities: no CCE.   Data Reviewed: Basic Metabolic Panel:  Recent Labs Lab 10/24/16 2225 10/24/16 2242 10/25/16 0645 10/26/16 0317  NA 134* 137 137 137  K 3.9 3.8 3.7 3.9  CL 103 103 106 106  CO2 22  --  23 27  GLUCOSE 113* 108* 102* 104*  BUN 26* 26* 21* 23*  CREATININE 1.25* 1.20* 1.03* 1.08*  CALCIUM 8.9  --  8.7* 8.6*   Liver Function Tests:  Recent Labs Lab 10/24/16 2225 10/26/16 0317  AST 16 15  ALT 16 16  ALKPHOS 67 60  BILITOT 0.4 0.1*  PROT 6.8 6.4*  ALBUMIN 3.6 3.3*   No results for input(s): LIPASE, AMYLASE in the last 168 hours. No results for input(s): AMMONIA in the last 168 hours. CBC:  Recent Labs  Lab 10/24/16 2225 10/24/16 2242 10/25/16 0645 10/26/16 0317  WBC 6.9  --  6.7 5.3  NEUTROABS 3.1  --   --  1.9  HGB 11.3* 11.2* 11.0* 10.6*  HCT 34.9* 33.0* 34.2* 33.1*  MCV 89.9  --  90.0 90.7  PLT 206  --  191 185   Cardiac Enzymes:  Recent Labs Lab 10/25/16 0248  TROPONINI <0.03   CBG (last 3)   Recent Labs  10/24/16 2310  GLUCAP 100*   No results found for this or any previous visit (from the past 240 hour(s)).   Studies: Mr Brain Wo Contrast  Result Date: 10/25/2016 CLINICAL DATA:  Sudden worsening of LEFT-sided weakness. History of prior stroke. Stroke risk factors include cardiomyopathy, hyperlipidemia, prior CVA, tobacco use disorder, and prior strokes. EXAM: MRI HEAD WITHOUT CONTRAST MRA HEAD WITHOUT CONTRAST TECHNIQUE: Multiplanar, multiecho pulse sequences of the  brain and surrounding structures were obtained without intravenous contrast. Angiographic images of the head were obtained using MRA technique without contrast. COMPARISON:  CT head 10/24/2016. MR head 10/06/2014. FINDINGS: MRI HEAD FINDINGS Brain: No evidence for acute infarction, hemorrhage, mass lesion, hydrocephalus, or extra-axial fluid. Slight premature for age atrophy. Moderately advanced for age T2 and FLAIR hyperintensities in the white matter, likely chronic microvascular ischemic change. Slit like lacunar infarct affecting the RIGHT posterior limb internal capsule was acute in 2016. No large vessel cortical infarct is present. There is an old basal ganglia hemorrhage on the LEFT, with chronic blood products and encephalomalacia. Vascular: Normal flow voids. Skull and upper cervical spine: Normal marrow signal. Sinuses/Orbits: Negative. Other: None. MRA HEAD FINDINGS The internal carotid arteries are widely patent. The basilar artery is widely patent with vertebrals codominant. No significant proximal intracranial stenosis or aneurysm. IMPRESSION: No acute stroke is evident. Chronic cerebral infarction in 2016 accounts for LEFT-sided weakness. Chronic LEFT basal ganglia hemorrhage, but no acute hemorrhage in the brain. Premature for age chronic microvascular ischemic change. Slight atrophy. Electronically Signed   By: Staci Righter M.D.   On: 10/25/2016 19:23   Mr Jodene Nam Head/brain AQ Cm  Result Date: 10/25/2016 CLINICAL DATA:  Sudden worsening of LEFT-sided weakness. History of prior stroke. Stroke risk factors include cardiomyopathy, hyperlipidemia, prior CVA, tobacco use disorder, and prior strokes. EXAM: MRI HEAD WITHOUT CONTRAST MRA HEAD WITHOUT CONTRAST TECHNIQUE: Multiplanar, multiecho pulse sequences of the brain and surrounding structures were obtained without intravenous contrast. Angiographic images of the head were obtained using MRA technique without contrast. COMPARISON:  CT head 10/24/2016.  MR head 10/06/2014. FINDINGS: MRI HEAD FINDINGS Brain: No evidence for acute infarction, hemorrhage, mass lesion, hydrocephalus, or extra-axial fluid. Slight premature for age atrophy. Moderately advanced for age T2 and FLAIR hyperintensities in the white matter, likely chronic microvascular ischemic change. Slit like lacunar infarct affecting the RIGHT posterior limb internal capsule was acute in 2016. No large vessel cortical infarct is present. There is an old basal ganglia hemorrhage on the LEFT, with chronic blood products and encephalomalacia. Vascular: Normal flow voids. Skull and upper cervical spine: Normal marrow signal. Sinuses/Orbits: Negative. Other: None. MRA HEAD FINDINGS The internal carotid arteries are widely patent. The basilar artery is widely patent with vertebrals codominant. No significant proximal intracranial stenosis or aneurysm. IMPRESSION: No acute stroke is evident. Chronic cerebral infarction in 2016 accounts for LEFT-sided weakness. Chronic LEFT basal ganglia hemorrhage, but no acute hemorrhage in the brain. Premature for age chronic microvascular ischemic change. Slight atrophy. Electronically Signed   By: Roderic Ovens.D.  On: 10/25/2016 19:23   Ct Head Code Stroke W/o Cm  Addendum Date: 10/25/2016   ADDENDUM REPORT: 10/25/2016 00:56 ADDENDUM: Critical Value/emergent results were called by telephone at the time of interpretation on 10/24/2016 at 2309 hours to Dr. Cheral Marker, Neurology who verbally acknowledged these results. Electronically Signed   By: Elon Alas M.D.   On: 10/25/2016 00:56   Result Date: 10/25/2016 CLINICAL DATA:  Code stroke. LEFT-sided weakness. History of stroke, hypertension and hyperlipidemia. EXAM: CT HEAD WITHOUT CONTRAST TECHNIQUE: Contiguous axial images were obtained from the base of the skull through the vertex without intravenous contrast. COMPARISON:  CT HEAD March 31, 2016 FINDINGS: BRAIN: No intraparenchymal hemorrhage, mass effect nor  midline shift. Old LEFT basal ganglia/ corona radiata lacunar infarct with ex vacuo dilatation LEFT lateral ventricle. Old RIGHT basal ganglia/internal capsule lacunar infarct. Subcentimeter LEFT choroidal fissure cyst. No acute large vascular territory infarcts. No abnormal extra-axial fluid collections. Basal cisterns are patent. VASCULAR: Mild calcific atherosclerosis of the carotid siphon. SKULL/SOFT TISSUES: No skull fracture. No significant soft tissue swelling. ORBITS/SINUSES: The included ocular globes and orbital contents are normal.The mastoid aircells and included paranasal sinuses are well-aerated. OTHER: Patient is edentulous. ASPECTS [facility] Stroke Program Early CT Score) - Ganglionic level infarction (caudate, lentiform nuclei, internal capsule, insula, M1-M3 cortex): 7 - Supraganglionic infarction (M4-M6 cortex): 3 Total score (0-10 with 10 being normal): 10 IMPRESSION: 1. No acute intracranial process. Old bilateral basal ganglia/internal capsule lacunar infarcts. 2. ASPECTS is 10. Dr. Cheral Marker, Neurology paged via [place] [facility] paging system on 10/24/2016 at 10:52 pm, awaiting return call. Electronically Signed: By: Elon Alas M.D. On: 10/24/2016 22:56     Scheduled Meds: . atorvastatin  80 mg Oral q1800  . azithromycin  500 mg Oral Daily   Followed by  . [START ON 10/27/2016] azithromycin  250 mg Oral Daily  . clopidogrel  75 mg Oral Daily  . enoxaparin (LOVENOX) injection  40 mg Subcutaneous Daily  . fluticasone  2 spray Each Nare Daily  . gabapentin  300 mg Oral QHS  . nicotine  14 mg Transdermal Daily  . oxymetazoline  1 spray Each Nare BID  . 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

## 2016-10-26 NOTE — Progress Notes (Signed)
   MRI negative. Exam C/W exaggeration of deficits.  No further recommendations. Stroke team will sign off at this time. Please call if we can be of further service.  Mikey Bussing PA-C [facility] Pager [phone] 10/26/2016, 8:49 AM

## 2016-10-26 NOTE — Progress Notes (Signed)
Received report from RN to accept care of patient at this time when PTAR here to transport patient according to discharge instructions.  Off unit at this time via stretcher and transport personnel.

## 2016-10-26 NOTE — Care Management Note (Addendum)
Case Management Note  Patient Details  Name: Brandy Roberts MRN: [id number] Date of Birth: 01-Jun-1971  Subjective/Objective:                  L hemiparesis Action/Plan: Discharge planning Expected Discharge Date:                  Expected Discharge Plan:  [place]  In-House Referral:     Discharge planning Services  CM Consult  Post Acute Care Choice:  Durable Medical Equipment, Home Health Choice offered to:  Patient  DME Arranged:  3-N-1, Shower stool, Walker rolling with seat DME Agency:  [place]:  RN, PT, OT, Nurse's Aide, Social Work, speech therapy [place] Agency:  [place]  Status of Service:  Completed, signed off  If discussed at [employer], dates discussed:    Additional Comments: CM met with pt who is adamantly opposed to going to [facility]; she has a friend she will be staying with: [place], [place] [address] (same mobile contact number).  Pt requests shower stool, rollator, and 3n1.  Pt chooses [facility] as home health agency and provider of DME.  Referral called to [facility] rep, Jermaine.  CM notified [facility] DME rep, Reggie to please deliver the DME to room so (friend) can take home as pt will need ambulance transport to home.  CM will arrange for PTAR as soon as DC order in place. No other CM needs were communicated.  Dellie Catholic, RN 10/26/2016, 4:31 PM

## 2016-10-26 NOTE — Discharge Summary (Signed)
Physician Discharge Summary  Brandy Roberts SWF:[id number] DOB: 04/24/1971 DOA: 10/24/2016  PCP: [name], FNP  Admit date: 10/24/2016 Discharge date: 10/26/2016  Admitted From: Home  Disposition:  Home   Recommendations for Outpatient Follow-up:  1. Follow up with PCP in 1 weeks 2. Please obtain BMP/CBC in one week 3. Please schedule outpatient echocardiogram and carotid doppler studies with PCP  Home Health: Yes RN PT OT SW AIDE Discharge Condition: STABLE  CODE STATUS: FULL   Brief/Interim Summary: HPI: Brandy Roberts is a 46 y.o. female with medical history significant of  prior CVA- 10/2014 with residual left side hemiparesis, OSA, hypertensive cardiomyopathy, tobacco abuse,  aortic regurgitation. She presented to the ED today with complaints of sudden worsening of left-sided weakness. Patient was standing in her kitchen cooking at about 10 PM, when suddenly had left-sided felt weak she almost fell to the floor but supported herself to have bed. Patient had a CVA 2016, and still has significant left-sided deficits, she uses a walker at baseline- her uncles walker as she left hers in a bus recently.  She also notes slurred speech this evening. ?facial asymmetry. Her uncle sleeping at the time. She also notes a headache that started at the same time. She has chronic urinary frequency- since last CVA. Denies bowel incontinence. No history of seizures. She is a current smoker. Patient endorses compliance with her medications which includes Plavix. At the time of my evaluation patient notes her sided weakness is worse when it started earlier today.   No chest pain, shortness of breath no cough, no fever , no dysuria, no nausea or vomiting but endorses poor appetite.  ED Course: Blood pressure 120/66, electrolytes unremarkable. CT head negative for acute intracranial process, old lacunar infarcts. Neurology was consulted in the ED. Patient refused TPA. There was also concern  for somatoform disorder. Assessment & Plan:   Acute worsening of left hemiparesis - MRI with no acute findings.  PT/OT recommending SNF. Pt declined and decided to go home with maximal home health services.  Neurology saw patient and signed off with no further recommendations.  Follow up with neurology outpatient.   Hypertension - blood pressures stable and controlled.  Dyslipidemia - atorvastatin increased to 80 mg daily.  Bradycardia - stable, asymptomatic.  Left ear pain - likely secondary to swollen nasal turbinates, added flonase, afrin, zithromax z pack.  Low back pain - treat with kpad, topical muscle creme.   DVT prophylaxis:Lovenox  Code Status:full  Family Communication:bedside  Disposition Plan:SNF  Consults called:neurology  Admission status:telemetry  Consultants:  neurology  Discharge Diagnoses:  Principal Problem:   Left hemiparesis ([place]) Active Problems:   Embolic stroke involving right middle cerebral artery [name]   Hyperlipidemia   Aortic regurgitation   Cardiomyopathy due to hypertension ([place])   Tobacco use disorder   CVA (cerebral vascular accident) [facility])  Discharge Instructions  Discharge Instructions    Increase activity slowly    Complete by:  As directed      Allergies as of 10/26/2016      Reactions   Tylenol [acetaminophen] Other (See Comments)   Pt stated tylenol gives her extreme headache      Medication List    STOP taking these medications   butalbital-acetaminophen-caffeine 50-325-40 MG tablet Commonly known as:  FIORICET, ESGIC   hydrochlorothiazide 25 MG tablet Commonly known as:  HYDRODIURIL   ibuprofen 800 MG tablet Commonly known as:  ADVIL,MOTRIN     TAKE these medications   amLODipine 10  MG tablet Commonly known as:  NORVASC Take 0.5 tablets (5 mg total) by mouth daily.   atorvastatin 80 MG tablet Commonly known as:  LIPITOR Take 1 tablet (80 mg total) by mouth daily at 6 PM. What  changed:  medication strength  See the new instructions.   azithromycin 250 MG tablet Commonly known as:  ZITHROMAX Take 1 tablet (250 mg total) by mouth daily.   bacitracin-neomycin-polymyxin-hydrocortisone 1 % ointment Commonly known as:  CORTISPORIN Apply 1 application topically 2 (two) times daily. What changed:  when to take this  reasons to take this   clopidogrel 75 MG tablet Commonly known as:  PLAVIX Take 1 tablet (75 mg total) by mouth daily.   fluticasone 50 MCG/ACT nasal spray Commonly known as:  FLONASE Place 2 sprays into both nostrils daily. Start taking on:  10/27/2016   gabapentin 300 MG capsule Commonly known as:  NEURONTIN Take 1 capsule (300 mg total) by mouth at bedtime.   hydrALAZINE 50 MG tablet Commonly known as:  APRESOLINE TAKE 1 TABLET BY MOUTH 3 TIMES DAILY WITH MEALS.   lisinopril 20 MG tablet Commonly known as:  PRINIVIL,ZESTRIL TAKE 1 TABLET (20 MG TOTAL) BY MOUTH 2 (TWO) TIMES DAILY.   metoprolol tartrate 50 MG tablet Commonly known as:  LOPRESSOR Take 1 tablet (50 mg total) by mouth 2 (two) times daily.   spironolactone 25 MG tablet Commonly known as:  ALDACTONE Take 1 tablet (25 mg total) by mouth daily. What changed:  medication strength  how much to take   venlafaxine XR 75 MG 24 hr capsule Commonly known as:  EFFEXOR XR Take 1 capsule (75 mg total) by mouth daily with breakfast.            Durable Medical Equipment        Start     Ordered   10/26/16 1628  For home use only DME 3 n 1  Once     10/26/16 1628   10/26/16 1628  For home use only DME Shower stool  Once     10/26/16 1628   10/26/16 1627  For home use only DME 4 wheeled rolling walker with seat  Once    Question:  Patient needs a walker to treat with the following condition  Answer:  Left hemiparesis ([place])   10/26/16 1628     Follow-up Information    [name], FNP. Schedule an appointment as soon as possible for a visit in 1 week(s).    Specialty:  Family Medicine Contact information: [place] [address] [phone]        Rosalin Hawking, MD. Schedule an appointment as soon as possible for a visit in 6 week(s).   Specialty:  Neurology Contact information: [address] [place]  [address] [phone]          Allergies  Allergen Reactions  . Tylenol [Acetaminophen] Other (See Comments)    Pt stated tylenol gives her extreme headache   Procedures/Studies: Mr Brain Wo Contrast  Result Date: 10/25/2016 CLINICAL DATA:  Sudden worsening of LEFT-sided weakness. History of prior stroke. Stroke risk factors include cardiomyopathy, hyperlipidemia, prior CVA, tobacco use disorder, and prior strokes. EXAM: MRI HEAD WITHOUT CONTRAST MRA HEAD WITHOUT CONTRAST TECHNIQUE: Multiplanar, multiecho pulse sequences of the brain and surrounding structures were obtained without intravenous contrast. Angiographic images of the head were obtained using MRA technique without contrast. COMPARISON:  CT head 10/24/2016. MR head 10/06/2014. FINDINGS: MRI HEAD FINDINGS Brain:  No evidence for acute infarction, hemorrhage, mass lesion, hydrocephalus, or extra-axial fluid. Slight premature for age atrophy. Moderately advanced for age T2 and FLAIR hyperintensities in the white matter, likely chronic microvascular ischemic change. Slit like lacunar infarct affecting the RIGHT posterior limb internal capsule was acute in 2016. No large vessel cortical infarct is present. There is an old basal ganglia hemorrhage on the LEFT, with chronic blood products and encephalomalacia. Vascular: Normal flow voids. Skull and upper cervical spine: Normal marrow signal. Sinuses/Orbits: Negative. Other: None. MRA HEAD FINDINGS The internal carotid arteries are widely patent. The basilar artery is widely patent with vertebrals codominant. No significant proximal intracranial stenosis or aneurysm. IMPRESSION: No acute stroke is evident.  Chronic cerebral infarction in 2016 accounts for LEFT-sided weakness. Chronic LEFT basal ganglia hemorrhage, but no acute hemorrhage in the brain. Premature for age chronic microvascular ischemic change. Slight atrophy. Electronically Signed   By: Staci Righter M.D.   On: 10/25/2016 19:23   US Pelvis Complete  Result Date: 10/01/2016 CLINICAL DATA:  Postmenopausal bleeding EXAM: TRANSABDOMINAL ULTRASOUND OF PELVIS TECHNIQUE: Transabdominal ultrasound examination of the pelvis was performed including evaluation of the uterus, ovaries, adnexal regions, and pelvic cul-de-sac. COMPARISON:  CT 11/03/2011 FINDINGS: Uterus Measurements: Limited visualization. Uterus measures 9.0 x 3.9 x 4.3 cm. Scattered calcifications. Note definite visualized focal fibroid. Endometrium Thickness: Not well visualized. Right ovary Measurements: Not visualized. No visible adnexal masses. Left ovary Measurements: Not visualized. No visible adnexal masses. Other findings:  No abnormal free fluid. Note: The patient refused transvaginal imaging. The patient's bladder was not completely filled and therefore visualization of pelvic structures suboptimal. IMPRESSION: Limited transabdominal study due to circumstances as described above. No definite acute process seen. Electronically Signed   By: Rolm Baptise M.D.   On: 10/01/2016 10:34   Mr Jodene Nam Head/brain ZO Cm  Result Date: 10/25/2016 CLINICAL DATA:  Sudden worsening of LEFT-sided weakness. History of prior stroke. Stroke risk factors include cardiomyopathy, hyperlipidemia, prior CVA, tobacco use disorder, and prior strokes. EXAM: MRI HEAD WITHOUT CONTRAST MRA HEAD WITHOUT CONTRAST TECHNIQUE: Multiplanar, multiecho pulse sequences of the brain and surrounding structures were obtained without intravenous contrast. Angiographic images of the head were obtained using MRA technique without contrast. COMPARISON:  CT head 10/24/2016. MR head 10/06/2014. FINDINGS: MRI HEAD FINDINGS Brain: No  evidence for acute infarction, hemorrhage, mass lesion, hydrocephalus, or extra-axial fluid. Slight premature for age atrophy. Moderately advanced for age T2 and FLAIR hyperintensities in the white matter, likely chronic microvascular ischemic change. Slit like lacunar infarct affecting the RIGHT posterior limb internal capsule was acute in 2016. No large vessel cortical infarct is present. There is an old basal ganglia hemorrhage on the LEFT, with chronic blood products and encephalomalacia. Vascular: Normal flow voids. Skull and upper cervical spine: Normal marrow signal. Sinuses/Orbits: Negative. Other: None. MRA HEAD FINDINGS The internal carotid arteries are widely patent. The basilar artery is widely patent with vertebrals codominant. No significant proximal intracranial stenosis or aneurysm. IMPRESSION: No acute stroke is evident. Chronic cerebral infarction in 2016 accounts for LEFT-sided weakness. Chronic LEFT basal ganglia hemorrhage, but no acute hemorrhage in the brain. Premature for age chronic microvascular ischemic change. Slight atrophy. Electronically Signed   By: Staci Righter M.D.   On: 10/25/2016 19:23   Ct Head Code Stroke W/o Cm  Addendum Date: 10/25/2016   ADDENDUM REPORT: 10/25/2016 00:56 ADDENDUM: Critical Value/emergent results were called by telephone at the time of interpretation on 10/24/2016 at 2309 hours to Dr. Cheral Marker, Neurology who  verbally acknowledged these results. Electronically Signed   By: Elon Alas M.D.   On: 10/25/2016 00:56   Result Date: 10/25/2016 CLINICAL DATA:  Code stroke. LEFT-sided weakness. History of stroke, hypertension and hyperlipidemia. EXAM: CT HEAD WITHOUT CONTRAST TECHNIQUE: Contiguous axial images were obtained from the base of the skull through the vertex without intravenous contrast. COMPARISON:  CT HEAD March 31, 2016 FINDINGS: BRAIN: No intraparenchymal hemorrhage, mass effect nor midline shift. Old LEFT basal ganglia/ corona radiata  lacunar infarct with ex vacuo dilatation LEFT lateral ventricle. Old RIGHT basal ganglia/internal capsule lacunar infarct. Subcentimeter LEFT choroidal fissure cyst. No acute large vascular territory infarcts. No abnormal extra-axial fluid collections. Basal cisterns are patent. VASCULAR: Mild calcific atherosclerosis of the carotid siphon. SKULL/SOFT TISSUES: No skull fracture. No significant soft tissue swelling. ORBITS/SINUSES: The included ocular globes and orbital contents are normal.The mastoid aircells and included paranasal sinuses are well-aerated. OTHER: Patient is edentulous. ASPECTS [facility] Stroke Program Early CT Score) - Ganglionic level infarction (caudate, lentiform nuclei, internal capsule, insula, M1-M3 cortex): 7 - Supraganglionic infarction (M4-M6 cortex): 3 Total score (0-10 with 10 being normal): 10 IMPRESSION: 1. No acute intracranial process. Old bilateral basal ganglia/internal capsule lacunar infarcts. 2. ASPECTS is 10. Dr. Cheral Marker, Neurology paged via [place] hospital paging system on 10/24/2016 at 10:52 pm, awaiting return call. Electronically Signed: By: Elon Alas M.D. On: 10/24/2016 22:56    (Echo, Carotid, EGD, Colonoscopy, ERCP)    Subjective: Pt without complaints.  Pt decided to go home and declined SNF.   Discharge Exam: Vitals:   10/26/16 0926 10/26/16 1417  BP: 140/68 (!) 151/66  Pulse: 74 88  Resp: 20 20  Temp: 98.4 F (36.9 C) 98.3 F (36.8 C)   Vitals:   10/26/16 0148 10/26/16 0450 10/26/16 0926 10/26/16 1417  BP: 119/69 126/70 140/68 (!) 151/66  Pulse: 65 64 74 88  Resp: 15 20 20 20   Temp: 97.5 F (36.4 C) 98.2 F (36.8 C) 98.4 F (36.9 C) 98.3 F (36.8 C)  TempSrc: Oral Oral Oral Oral  SpO2: 96% 97% 98% 98%  Weight:      Height:       General: Pt is alert, awake, not in acute distress Cardiovascular: RRR, S1/S2 +, no rubs, no gallops Respiratory: CTA bilaterally, no wheezing, no rhonchi Abdominal: Soft, NT, ND, bowel sounds  + Extremities: no edema, no cyanosis  The results of significant diagnostics from this hospitalization (including imaging, microbiology, ancillary and laboratory) are listed below for reference.     Microbiology: Recent Results (from the past 240 hour(s))  Culture, blood (routine x 2)     Status: None (Preliminary result)   Collection Time: 10/25/16  2:48 AM  Result Value Ref Range Status   Specimen Description BLOOD BLOOD RIGHT FOREARM  Final   Special Requests   Final    BOTTLES DRAWN AEROBIC AND ANAEROBIC Blood Culture adequate volume   Culture NO GROWTH 1 DAY  Final   Report Status PENDING  Incomplete  Culture, blood (routine x 2)     Status: None (Preliminary result)   Collection Time: 10/25/16  2:56 AM  Result Value Ref Range Status   Specimen Description BLOOD LEFT ANTECUBITAL  Final   Special Requests   Final    BOTTLES DRAWN AEROBIC AND ANAEROBIC Blood Culture adequate volume   Culture NO GROWTH 1 DAY  Final   Report Status PENDING  Incomplete     Labs: BNP (last 3 results) No results for input(s): BNP  in the last 8760 hours. Basic Metabolic Panel:  Recent Labs Lab 10/24/16 2225 10/24/16 2242 10/25/16 0645 10/26/16 0317  NA 134* 137 137 137  K 3.9 3.8 3.7 3.9  CL 103 103 106 106  CO2 22  --  23 27  GLUCOSE 113* 108* 102* 104*  BUN 26* 26* 21* 23*  CREATININE 1.25* 1.20* 1.03* 1.08*  CALCIUM 8.9  --  8.7* 8.6*   Liver Function Tests:  Recent Labs Lab 10/24/16 2225 10/26/16 0317  AST 16 15  ALT 16 16  ALKPHOS 67 60  BILITOT 0.4 0.1*  PROT 6.8 6.4*  ALBUMIN 3.6 3.3*   No results for input(s): LIPASE, AMYLASE in the last 168 hours. No results for input(s): AMMONIA in the last 168 hours. CBC:  Recent Labs Lab 10/24/16 2225 10/24/16 2242 10/25/16 0645 10/26/16 0317  WBC 6.9  --  6.7 5.3  NEUTROABS 3.1  --   --  1.9  HGB 11.3* 11.2* 11.0* 10.6*  HCT 34.9* 33.0* 34.2* 33.1*  MCV 89.9  --  90.0 90.7  PLT 206  --  191 185   Cardiac  Enzymes:  Recent Labs Lab 10/25/16 0248  TROPONINI <0.03   BNP: Invalid input(s): POCBNP CBG:  Recent Labs Lab 10/24/16 2310  GLUCAP 100*   D-Dimer No results for input(s): DDIMER in the last 72 hours. Hgb A1c  Recent Labs  10/25/16 0257  HGBA1C 5.7*   Lipid Profile  Recent Labs  10/25/16 0248  CHOL 132  HDL 40*  LDLCALC 84  TRIG 38  CHOLHDL 3.3   Thyroid function studies No results for input(s): TSH, T4TOTAL, T3FREE, THYROIDAB in the last 72 hours.  Invalid input(s): FREET3 Anemia work up No results for input(s): VITAMINB12, FOLATE, FERRITIN, TIBC, IRON, RETICCTPCT in the last 72 hours. Urinalysis    Component Value Date/Time   COLORURINE YELLOW 10/25/2016 0900   APPEARANCEUR CLEAR 10/25/2016 0900   LABSPEC 1.010 10/25/2016 0900   PHURINE 6.0 10/25/2016 0900   GLUCOSEU NEGATIVE 10/25/2016 0900   HGBUR LARGE (A) 10/25/2016 0900   BILIRUBINUR NEGATIVE 10/25/2016 0900   KETONESUR NEGATIVE 10/25/2016 0900   PROTEINUR NEGATIVE 10/25/2016 0900   UROBILINOGEN 0.2 10/06/2014 1228   NITRITE NEGATIVE 10/25/2016 0900   LEUKOCYTESUR NEGATIVE 10/25/2016 0900   Sepsis Labs Invalid input(s): PROCALCITONIN,  WBC,  LACTICIDVEN Microbiology Recent Results (from the past 240 hour(s))  Culture, blood (routine x 2)     Status: None (Preliminary result)   Collection Time: 10/25/16  2:48 AM  Result Value Ref Range Status   Specimen Description BLOOD BLOOD RIGHT FOREARM  Final   Special Requests   Final    BOTTLES DRAWN AEROBIC AND ANAEROBIC Blood Culture adequate volume   Culture NO GROWTH 1 DAY  Final   Report Status PENDING  Incomplete  Culture, blood (routine x 2)     Status: None (Preliminary result)   Collection Time: 10/25/16  2:56 AM  Result Value Ref Range Status   Specimen Description BLOOD LEFT ANTECUBITAL  Final   Special Requests   Final    BOTTLES DRAWN AEROBIC AND ANAEROBIC Blood Culture adequate volume   Culture NO GROWTH 1 DAY  Final   Report  Status PENDING  Incomplete   Time coordinating discharge: 32 minutes  SIGNED:  Irwin Brakeman, MD  [facility] 10/26/2016, 4:34 PM Pager [phone]  If 7PM-7AM, please contact night-coverage [email or web address] Password [email or web address]

## 2016-10-26 NOTE — Care Management Note (Signed)
Case Management Note  Patient Details  Name: Angalena Cousineau MRN: [id number] Date of Birth: [birth year]  Subjective/Objective:                  Acute worsening of left hemiparesis Action/Plan: Discharge planning Expected Discharge Date:                  Expected Discharge Plan:  [place]  In-House Referral:  Clinical Social Work  Discharge planning Services  CM Consult  Post Acute Care Choice:    Choice offered to:     DME Arranged:    DME Agency:     HH Arranged:    [place] Agency:     Status of Service:  In process, will continue to follow  If discussed at Long Length of Stay Meetings, dates discussed:    Additional Comments: CM notes pt's MRI negative but PT recommending SNF. CM has contacted CSW [phone] to please assess for SNF and this CM will continue to follow if SNF is NOT an option.  Dellie Catholic, RN 10/26/2016, 2:40 PM

## 2016-10-26 NOTE — Clinical Social Work Note (Signed)
CSW consulted for SNF placement. CSW met with pt at bedside to address SNF consult. Pt refusing SNF and wants HHPT. Pt will be staying with a friend who will assist with pt need. RNCM aware and following pt for d/c needs. CSW signing off as no further social work needs identified.   Oretha Ellis, [place], [place] Work (Weekend) [phone]

## 2016-10-29 ENCOUNTER — Other Ambulatory Visit: Payer: Self-pay | Admitting: Family Medicine

## 2016-10-29 ENCOUNTER — Encounter: Payer: Self-pay | Admitting: Family Medicine

## 2016-10-29 ENCOUNTER — Telehealth: Payer: Self-pay | Admitting: Family Medicine

## 2016-10-29 NOTE — Telephone Encounter (Signed)
Brandy Roberts called the office to inform PCP that pt's bp is low and she is experiencing severe back pain. Brandy Roberts is also requesting verbal orders for OT 1 time 1 week, 2 times for 2 weeks and 1 time for 1 week. Please follow up. Please advice

## 2016-10-29 NOTE — Telephone Encounter (Signed)
Stephannie Peters with OT at 2:35pm to follow up. Verbal order for OT given and Speech given. Recommend patient schedule in office follow up appointment as soon as possible. Recent history of hospital discharge but has not followed up yet.

## 2016-10-29 NOTE — Telephone Encounter (Signed)
Brandy Roberts called the office to inform PCP that pt's bp is low and she is experiencing severe back pain. Brandy Roberts is also requesting verbal orders for OT 1 time 1 week, 2 times for 2 weeks and 1 time for 1 week. Please follow up.  Thank you.

## 2016-10-29 NOTE — Telephone Encounter (Signed)
Caller calling to request verbal orders for speech therapy 2x a week for 2 weeks. Also, would like to advise that pt bp was 93/48 today. Pt complaining of severe back pain and states that muscle rub cream that she was given in hosp is not working. Please f/u with caller.

## 2016-10-30 ENCOUNTER — Other Ambulatory Visit: Payer: Self-pay | Admitting: Family Medicine

## 2016-10-30 LAB — CULTURE, BLOOD (ROUTINE X 2)
CULTURE: NO GROWTH
Culture: NO GROWTH
SPECIAL REQUESTS: ADEQUATE
Special Requests: ADEQUATE

## 2016-10-30 NOTE — Telephone Encounter (Signed)
Called and left patient a message informing her that provider got her message and would like to see her today. I informed patient that she can be seen today @1 :30pm. Asked her to call us back to put her on the schedule.

## 2016-10-31 ENCOUNTER — Ambulatory Visit (HOSPITAL_COMMUNITY): Payer: Medicaid Other

## 2016-10-31 ENCOUNTER — Ambulatory Visit: Payer: Medicaid Other | Attending: Family Medicine | Admitting: Family Medicine

## 2016-10-31 ENCOUNTER — Encounter (HOSPITAL_COMMUNITY): Payer: Self-pay

## 2016-10-31 VITALS — BP 95/57 | HR 60 | Temp 98.0°F | Resp 18 | Ht 66.0 in | Wt 176.4 lb

## 2016-10-31 DIAGNOSIS — G4733 Obstructive sleep apnea (adult) (pediatric): Secondary | ICD-10-CM | POA: Diagnosis not present

## 2016-10-31 DIAGNOSIS — R34 Anuria and oliguria: Secondary | ICD-10-CM

## 2016-10-31 DIAGNOSIS — H9202 Otalgia, left ear: Secondary | ICD-10-CM | POA: Diagnosis not present

## 2016-10-31 DIAGNOSIS — Z7902 Long term (current) use of antithrombotics/antiplatelets: Secondary | ICD-10-CM | POA: Insufficient documentation

## 2016-10-31 DIAGNOSIS — M545 Low back pain, unspecified: Secondary | ICD-10-CM

## 2016-10-31 DIAGNOSIS — I69354 Hemiplegia and hemiparesis following cerebral infarction affecting left non-dominant side: Secondary | ICD-10-CM | POA: Insufficient documentation

## 2016-10-31 DIAGNOSIS — K219 Gastro-esophageal reflux disease without esophagitis: Secondary | ICD-10-CM

## 2016-10-31 DIAGNOSIS — I6930 Unspecified sequelae of cerebral infarction: Secondary | ICD-10-CM

## 2016-10-31 DIAGNOSIS — Z09 Encounter for follow-up examination after completed treatment for conditions other than malignant neoplasm: Secondary | ICD-10-CM | POA: Diagnosis not present

## 2016-10-31 LAB — POCT URINALYSIS DIPSTICK
Bilirubin, UA: NEGATIVE
Blood, UA: NEGATIVE
GLUCOSE UA: NEGATIVE
KETONES UA: NEGATIVE
Leukocytes, UA: NEGATIVE
Nitrite, UA: NEGATIVE
SPEC GRAV UA: 1.02 (ref 1.010–1.025)
UROBILINOGEN UA: 0.2 U/dL
pH, UA: 5.5 (ref 5.0–8.0)

## 2016-10-31 MED ORDER — RANITIDINE HCL 150 MG PO TABS: 150.0000 mg | ORAL_TABLET | Freq: Two times a day (BID) | ORAL | Status: DC

## 2016-10-31 MED ORDER — TRAMADOL HCL 50 MG PO TABS: 50.0000 mg | ORAL_TABLET | Freq: Three times a day (TID) | ORAL | 0 refills | Status: DC | PRN

## 2016-10-31 NOTE — Patient Instructions (Signed)
Food Choices for Gastroesophageal Reflux Disease, Adult When you have gastroesophageal reflux disease (GERD), the foods you eat and your eating habits are very important. Choosing the right foods can help ease your discomfort. What guidelines do I need to follow?  Choose fruits, vegetables, whole grains, and low-fat dairy products.  Choose low-fat meat, fish, and poultry.  Limit fats such as oils, salad dressings, butter, nuts, and avocado.  Keep a food diary. This helps you identify foods that cause symptoms.  Avoid foods that cause symptoms. These may be different for everyone.  Eat small meals often instead of 3 large meals a day.  Eat your meals slowly, in a place where you are relaxed.  Limit fried foods.  Cook foods using methods other than frying.  Avoid drinking alcohol.  Avoid drinking large amounts of liquids with your meals.  Avoid bending over or lying down until 2-3 hours after eating. What foods are not recommended? These are some foods and drinks that may make your symptoms worse: Vegetables  Tomatoes. Tomato juice. Tomato and spaghetti sauce. Chili peppers. Onion and garlic. Horseradish. Fruits  Oranges, grapefruit, and lemon (fruit and juice). Meats  High-fat meats, fish, and poultry. This includes hot dogs, ribs, ham, sausage, salami, and bacon. Dairy  Whole milk and chocolate milk. Sour cream. Cream. Butter. Ice cream. Cream cheese. Drinks  Coffee and tea. Bubbly (carbonated) drinks or energy drinks. Condiments  Hot sauce. Barbecue sauce. Sweets/Desserts  Chocolate and cocoa. Donuts. Peppermint and spearmint. Fats and Oils  High-fat foods. This includes French fries and potato chips. Other  Vinegar. Strong spices. This includes black pepper, white pepper, red pepper, cayenne, curry powder, cloves, ginger, and chili powder. The items listed above may not be a complete list of foods and drinks to avoid. Contact your dietitian for more information.    This information is not intended to replace advice given to you by your health care provider. Make sure you discuss any questions you have with your health care provider. Document Released: 11/18/2011 Document Revised: 10/25/2015 Document Reviewed: 03/23/2013 Elsevier Interactive Patient Education  2017 [employer]  

## 2016-10-31 NOTE — Progress Notes (Signed)
Patient complains left leg pain

## 2016-10-31 NOTE — Progress Notes (Signed)
Subjective:  Patient ID: Brandy Roberts, female    DOB: 1970/11/30  Age: 46 y.o. MRN: [id number]  CC: Follow-up   HPI Brandy Roberts presents for hospital follow up. PMH of CVA with residual left sided weakness, HTN, aortic regurgitation, OSA, and tobacco use. She was admitted to the ED on 10/24/16 with c/o sudden, worsening left sided weakness, slurred speech, and headache. Concern for acute CVA vs somatoform disorder. Workup was done including CT and MRI showed no acute findings. PT/OT recommended SNF however patient declined and made the decision to receive home health services. Recommend repeat labs, follow up with neurology outpatient and schedule studies for echo and carotid doppler studies. Received notification from [facility] PT/OT concerning patient's low BP's and back pain. Patient was encouraged to come in for follow up. She reports low back pain with spasms, no sciatica symptoms. Denies any history of injury, dysuria, hematuria, or foul smelling urine. Reports urination twice a day. Back pain 7-10/10. She also reports episodes of dry severe cough that caused vomiting episodes for 3 days ago. She denies any dysphagia, fevers, SOB, wheezing, or sick contacts. She reports history of heartburn and acid reflux. Denies taking anything for symptoms. Reports left sided ear pain. Denies any tinnitus, trauma, hearing loss, popping sensation or drainage of the ears.    Outpatient Medications Prior to Visit  Medication Sig Dispense Refill  . amLODipine (NORVASC) 10 MG tablet Take 0.5 tablets (5 mg total) by mouth daily. 30 tablet 3  . atorvastatin (LIPITOR) 80 MG tablet Take 1 tablet (80 mg total) by mouth daily at 6 PM. 30 tablet 0  . bacitracin-neomycin-polymyxin-hydrocortisone (CORTISPORIN) 1 % ointment Apply 1 application topically 2 (two) times daily. (Patient taking differently: Apply 1 application topically 2 (two) times daily as needed (rash). ) 15 g 0  . clopidogrel (PLAVIX) 75 MG tablet Take  1 tablet (75 mg total) by mouth daily. 30 tablet 3  . fluticasone (FLONASE) 50 MCG/ACT nasal spray Place 2 sprays into both nostrils daily. 16 g 0  . gabapentin (NEURONTIN) 300 MG capsule Take 1 capsule (300 mg total) by mouth at bedtime. 30 capsule 2  . hydrALAZINE (APRESOLINE) 50 MG tablet TAKE 1 TABLET BY MOUTH 3 TIMES DAILY WITH MEALS. 90 tablet 1  . lisinopril (PRINIVIL,ZESTRIL) 20 MG tablet TAKE 1 TABLET (20 MG TOTAL) BY MOUTH 2 (TWO) TIMES DAILY. 60 tablet 2  . metoprolol (LOPRESSOR) 50 MG tablet Take 1 tablet (50 mg total) by mouth 2 (two) times daily. 60 tablet 3  . spironolactone (ALDACTONE) 25 MG tablet Take 1 tablet (25 mg total) by mouth daily. 30 tablet 0  . venlafaxine XR (EFFEXOR XR) 75 MG 24 hr capsule Take 1 capsule (75 mg total) by mouth daily with breakfast. 30 capsule 1   No facility-administered medications prior to visit.     ROS Review of Systems  HENT: Positive for ear pain (left). Negative for ear discharge and hearing loss.   Eyes: Negative.   Respiratory: Negative.   Cardiovascular: Negative.   Gastrointestinal:       Heartburn  Genitourinary: Positive for decreased urine volume.  Musculoskeletal: Positive for back pain.  Skin: Negative.   Neurological: Positive for weakness.  Psychiatric/Behavioral: Negative.    Objective:  BP (!) 95/57 (BP Location: Left Arm, Patient Position: Sitting, Cuff Size: Normal)   Pulse 60   Temp 98 F (36.7 C) (Oral)   Resp 18   Ht 5\' 6"  (1.676 m)   Wt 176 lb 6.4  oz (80 kg)   SpO2 98%   BMI 28.47 kg/m   BP/Weight 10/31/2016 10/26/2016 3/78/5885  Systolic BP 95 027 -  Diastolic BP 57 66 -  Wt. (Lbs) 176.4 - 186.51  BMI 28.47 - 30.1  Some encounter information is confidential and restricted. Go to Review Flowsheets activity to see all data.     Physical Exam  Constitutional: She is oriented to person, place, and time. She appears well-developed and well-nourished.  HENT:  Head: Normocephalic and atraumatic.  Right  Ear: External ear normal.  Left Ear: External ear normal.  Nose: Nose normal.  Mouth/Throat: Oropharynx is clear and moist.  Eyes: Conjunctivae are normal. Pupils are equal, round, and reactive to light.  Neck: Normal range of motion. Neck supple. No JVD present.  Cardiovascular: Normal rate, regular rhythm and intact distal pulses.   Murmur heard. Pulmonary/Chest: Effort normal and breath sounds normal.  Abdominal: Soft. Bowel sounds are normal. There is no tenderness. There is no CVA tenderness.  Musculoskeletal:       Lumbar back: She exhibits pain.  3/5 muscle strength LUE/LLE; 4/5 muscle strength RUE/RLL .   Lymphadenopathy:    She has no cervical adenopathy.  Neurological: She is alert and oriented to person, place, and time. She has normal reflexes. Gait abnormal.  Skin: Skin is warm and dry.  Psychiatric: Her affect is blunt. Her speech is slurred (history of cva). She expresses no homicidal and no suicidal ideation. She expresses no suicidal plans and no homicidal plans.  Nursing note and vitals reviewed.   Assessment & Plan:   Problem List Items Addressed This Visit    None    Visit Diagnoses    History of CVA with residual deficit    -  Primary   Relevant Orders   ECHOCARDIOGRAM COMPLETE (Completed)   VAS US CAROTID   Acute bilateral low back pain without sciatica       Relevant Medications   traMADol (ULTRAM) 50 MG tablet   Other Relevant Orders   Urinalysis Dipstick (Completed)   DG Lumbar Spine Complete   Gastroesophageal reflux disease without esophagitis       Relevant Medications   ranitidine (ZANTAC) 150 MG tablet   Oliguria       Rule out possible UTI or hematuria related to nephrolithiasis   Evaluate kidney function   Relevant Orders   Basic metabolic panel   Urinalysis Dipstick (Completed)   Left ear pain       Ear exam unremarkable. Symptom possibly related to GERD.   Hospital discharge follow-up       -Orthostatic VS performed   -Patient  reports still HCTZ for BP which was discontinued prior to hospital discharge, as well as    ibuprofen,and fioricet   -Consider  HCTZ use is contributing to her hypotension at home. Stop taking discontinued    medications.   Relevant Orders   ECHOCARDIOGRAM COMPLETE (Completed)   Basic metabolic panel   CBC with Differential   VAS US CAROTID      Meds ordered this encounter  Medications  . traMADol (ULTRAM) 50 MG tablet    Sig: Take 1 tablet (50 mg total) by mouth every 8 (eight) hours as needed for severe pain.    Dispense:  30 tablet    Refill:  0    Order Specific Question:   Supervising Provider    Answer:   Tresa Garter [id number]  . ranitidine (ZANTAC) 150 MG tablet    Sig:  Take 1 tablet (150 mg total) by mouth 2 (two) times daily.    Order Specific Question:   Supervising Provider    Answer:   Tresa Garter [[id number]]    Follow-up: Return in about 2 weeks (around 11/14/2016) for Hypotension.   [name]

## 2016-11-03 ENCOUNTER — Inpatient Hospital Stay: Payer: Medicaid Other | Admitting: Family Medicine

## 2016-11-04 ENCOUNTER — Telehealth: Payer: Self-pay | Admitting: Family Medicine

## 2016-11-04 NOTE — Telephone Encounter (Signed)
Brandy Roberts from [facility] called requesting physical therapy order for once a week for 2 weeks, twice a week for 2 weeks and once a week for 1 week. Please f/up

## 2016-11-04 NOTE — Telephone Encounter (Signed)
[name] from [facility] called patients' BP 1st reading was 90/70 and an hour ago reading was 102/58. Patient states she feels her heart flutters but when nurse checked it, it was normal. Patient has not taken lisinopril, Metoprolol, ranitidine and alvactium before bp readings. Patient only had taken Amlodipine and hydralazine. Patient has not had anymore vomiting but continues to have poor appetite and feels nauseous.

## 2016-11-04 NOTE — Telephone Encounter (Signed)
Brandy Roberts from [place] called patients' BP 1st reading was 90/70 and an hour ago reading was 102/58. Patient states she feels her heart flutters but when nurse checked it, it was normal. Patient has not taken lisinopril, Metoprolol, ranitidine and alvactium before bp readings. Patient only had taken Amlodipine and hydralazine. Patient has not had anymore vomiting but continues to have poor appetite and feels nauseous.

## 2016-11-04 NOTE — Telephone Encounter (Signed)
Called and spoke with [name]. She reports patient is only taking amlodipine and HTCZ. She reports this is patient's first episode experiencing fluttering symptoms. Reports HR was 61 bpm. Patient is still experiencing nausea with poor appetite, no vomiting. Reports she is able to tolerate fluids and keep them down; pt.is drinking fluids and Glucerna. Mentioned orders for palliative care. Will consult and notify. Will contact pt.to address symptoms.

## 2016-11-04 NOTE — Telephone Encounter (Signed)
Called and left message asking patient to call back. Recommend scheduling a follow up appointment this week to address concerns.

## 2016-11-04 NOTE — Telephone Encounter (Signed)
Shaunda from [facility] called requesting physical therapy order for once a week for 2 weeks, twice a week for 2 weeks and once a week for 1 week. Please f/up

## 2016-11-05 ENCOUNTER — Other Ambulatory Visit: Payer: Self-pay | Admitting: Family Medicine

## 2016-11-05 ENCOUNTER — Other Ambulatory Visit: Payer: Self-pay | Admitting: Neurology

## 2016-11-05 DIAGNOSIS — I10 Essential (primary) hypertension: Secondary | ICD-10-CM

## 2016-11-05 MED FILL — CLOPIDOGREL 75 MG TABLET: 75 | 30 days supply | Qty: 30 | Fill #2

## 2016-11-05 MED FILL — AMLODIPINE BESYLATE 10 MG T: 10 | 30 days supply | Qty: 15 | Fill #5

## 2016-11-05 MED FILL — LISINOPRIL 20 MG TABLET: 20 | 30 days supply | Qty: 60 | Fill #2

## 2016-11-05 MED FILL — ATORVASTATIN 10 MG TABLET: 10 | 30 days supply | Qty: 30 | Fill #2

## 2016-11-05 MED FILL — hydrALAZINE HCL 50 MG TABS: 50 | 30 days supply | Qty: 90 | Fill #1

## 2016-11-05 MED FILL — VENLAFAXINE HCL ER 75 MG CA: 75 | 30 days supply | Qty: 30 | Fill #0

## 2016-11-05 MED FILL — METOPROLOL TARTRATE 50 MG T: 50 | 30 days supply | Qty: 60 | Fill #2

## 2016-11-05 NOTE — Telephone Encounter (Signed)
Called [facility] to follow up verbal order given for PT.

## 2016-11-05 NOTE — Telephone Encounter (Signed)
Called [facility] to follow up verbal order given for PT. Order given.

## 2016-11-06 ENCOUNTER — Ambulatory Visit (HOSPITAL_COMMUNITY): Payer: Medicaid Other | Attending: Cardiology

## 2016-11-06 ENCOUNTER — Other Ambulatory Visit: Payer: Self-pay

## 2016-11-06 DIAGNOSIS — I6930 Unspecified sequelae of cerebral infarction: Secondary | ICD-10-CM | POA: Diagnosis present

## 2016-11-06 DIAGNOSIS — I517 Cardiomegaly: Secondary | ICD-10-CM | POA: Diagnosis not present

## 2016-11-06 DIAGNOSIS — Z09 Encounter for follow-up examination after completed treatment for conditions other than malignant neoplasm: Secondary | ICD-10-CM | POA: Insufficient documentation

## 2016-11-06 MED ORDER — SPIRONOLACTONE 25 MG PO TABS: 25.0000 mg | ORAL_TABLET | Freq: Every day | ORAL | 0 refills | Status: DC

## 2016-11-06 MED FILL — SPIRONOLACTONE 25 MG TABLET: 25 | 30 days supply | Qty: 30 | Fill #0

## 2016-11-07 ENCOUNTER — Other Ambulatory Visit: Payer: Self-pay | Admitting: Family Medicine

## 2016-11-07 NOTE — Telephone Encounter (Signed)
Sonia Baller from [facility] called stating that when pt. Was discharged from the hospital her paperwork stated that she needed to take 80mg  of Lipitor but pt. Only has 10mg . Rep also stated that pt. BP continuous to be low. Please f/u

## 2016-11-07 NOTE — Telephone Encounter (Signed)
ok 

## 2016-11-07 NOTE — Telephone Encounter (Signed)
Charmaine from [facility] called stating pt. Has an appt. On Monday 11/10/16 but pt. PCP has not closed the note. Rep stated if Pt. PCP can close the note. Please f/u

## 2016-11-10 ENCOUNTER — Other Ambulatory Visit: Payer: Self-pay | Admitting: Family Medicine

## 2016-11-10 ENCOUNTER — Ambulatory Visit (HOSPITAL_COMMUNITY): Payer: Medicaid Other

## 2016-11-10 DIAGNOSIS — E785 Hyperlipidemia, unspecified: Secondary | ICD-10-CM

## 2016-11-10 MED ORDER — ATORVASTATIN CALCIUM 80 MG PO TABS: 80.0000 mg | ORAL_TABLET | Freq: Every day | ORAL | 3 refills | Status: DC

## 2016-11-10 MED FILL — ATORVASTATIN 80 MG TABLET: 80 | 30 days supply | Qty: 30 | Fill #0

## 2016-11-10 NOTE — Telephone Encounter (Signed)
CMA call regarding medication refill   Patient was aware and understood & she already made an appt for June 15 regarding her low BP

## 2016-11-10 NOTE — Telephone Encounter (Signed)
Sonia Baller from [facility] called stating that when pt. Was discharged from the hospital her paperwork stated that she needed to take 80mg  of Lipitor but pt. Only has 10mg . Rep also stated that pt. BP continuous to be low. Please advice

## 2016-11-10 NOTE — Telephone Encounter (Signed)
Will refill for atorvastatin 80 mg QD. Attempts were made to call patient for follow up regarding BP in the past. She needs to schedule in office visit for further evaluation of low BP. Recommend scheduling appointment slot today or sometimes this week based on her availabity. It is important that is adherent with taking her medications as prescribed.

## 2016-11-11 ENCOUNTER — Telehealth: Payer: Self-pay | Admitting: *Deleted
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
## 2016-11-12 ENCOUNTER — Ambulatory Visit: Payer: Medicaid Other | Admitting: Neurology

## 2016-11-13 ENCOUNTER — Encounter: Payer: Self-pay | Admitting: Neurology

## 2016-11-14 ENCOUNTER — Ambulatory Visit: Payer: Medicaid Other | Attending: Family Medicine | Admitting: Licensed Clinical Social Worker

## 2016-11-14 ENCOUNTER — Ambulatory Visit: Payer: Medicaid Other | Attending: Family Medicine | Admitting: Family Medicine

## 2016-11-14 VITALS — BP 98/60 | HR 62 | Temp 98.0°F | Resp 18 | Ht 66.0 in | Wt 176.2 lb

## 2016-11-14 DIAGNOSIS — G4733 Obstructive sleep apnea (adult) (pediatric): Secondary | ICD-10-CM | POA: Insufficient documentation

## 2016-11-14 DIAGNOSIS — Z5189 Encounter for other specified aftercare: Secondary | ICD-10-CM | POA: Diagnosis present

## 2016-11-14 DIAGNOSIS — R112 Nausea with vomiting, unspecified: Secondary | ICD-10-CM | POA: Diagnosis not present

## 2016-11-14 DIAGNOSIS — R059 Cough, unspecified: Secondary | ICD-10-CM

## 2016-11-14 DIAGNOSIS — I351 Nonrheumatic aortic (valve) insufficiency: Secondary | ICD-10-CM | POA: Diagnosis not present

## 2016-11-14 DIAGNOSIS — I10 Essential (primary) hypertension: Secondary | ICD-10-CM | POA: Insufficient documentation

## 2016-11-14 DIAGNOSIS — Z09 Encounter for follow-up examination after completed treatment for conditions other than malignant neoplasm: Secondary | ICD-10-CM

## 2016-11-14 DIAGNOSIS — F4321 Adjustment disorder with depressed mood: Secondary | ICD-10-CM

## 2016-11-14 DIAGNOSIS — Z720 Tobacco use: Secondary | ICD-10-CM | POA: Insufficient documentation

## 2016-11-14 DIAGNOSIS — G629 Polyneuropathy, unspecified: Secondary | ICD-10-CM | POA: Insufficient documentation

## 2016-11-14 DIAGNOSIS — R05 Cough: Secondary | ICD-10-CM

## 2016-11-14 DIAGNOSIS — I6930 Unspecified sequelae of cerebral infarction: Secondary | ICD-10-CM

## 2016-11-14 DIAGNOSIS — I69354 Hemiplegia and hemiparesis following cerebral infarction affecting left non-dominant side: Secondary | ICD-10-CM | POA: Diagnosis present

## 2016-11-14 DIAGNOSIS — I69328 Other speech and language deficits following cerebral infarction: Secondary | ICD-10-CM | POA: Diagnosis not present

## 2016-11-14 MED ORDER — ONDANSETRON HCL 4 MG PO TABS: 4.0000 mg | ORAL_TABLET | Freq: Three times a day (TID) | ORAL | 0 refills | Status: DC | PRN

## 2016-11-14 MED ORDER — GABAPENTIN 300 MG PO CAPS: 300.0000 mg | ORAL_CAPSULE | Freq: Two times a day (BID) | ORAL | 2 refills | Status: DC

## 2016-11-14 NOTE — Progress Notes (Signed)
Subjective:  Patient ID: Brandy Roberts, female    DOB: [birth year]  Age: 46 y.o. MRN: [id number]  CC: Follow-up   HPI Magda Muise presents for follow up. Brandy Roberts presents for hospital follow up. PMH of CVA with residual left sided weakness, HTN, aortic regurgitation, OSA, and tobacco use. She was admitted to the ED on 10/24/16 with c/o sudden, worsening left sided weakness, slurred speech, and headache. Concern for acute CVA vs somatoform disorder. Workup was done including CT and MRI showed no acute findings. PT/OT recommended SNF however patient declined and made the decision to receive home health services. She is receiving [place] services. Was notified by [facility] RN that patient was still taking atorvastion 10 mg instead of the 80 mg that was prescribed. Patient reports not picking up medications previously prescribed but plans to pick up medications today. She reports missing her recent neurology appointment and is requesting to be referred to neurologist specialist she was seeing prior to most recent hospitalization.  Patient complains of depression. She complains of anhedonia, depressed mood and suicidal thoughts without plan. Onset was approximately  2 weeks ago, gradually worsening since that time. Possible organic causes contributing are: neuro.  Risk factors: negative life event recent history of CVA with residual. Previous treatment includes Effexor which was initiated 3 weeks ago . She is agreeable to speaking with LCSW at this time. She is agreeable to counseling at this time. She still reports episodes of dry cough that cause vomiting episodes. Denies any coffee ground emesis or hematemesis.  She denies any dysphagia, fevers, SOB, wheezing, or sick contacts. Denies taking anything for symptoms.    Outpatient Medications Prior to Visit  Medication Sig Dispense Refill  . amLODipine (NORVASC) 10 MG tablet Take 0.5 tablets (5 mg total) by mouth daily. 30 tablet 3  . atorvastatin  (LIPITOR) 80 MG tablet Take 1 tablet (80 mg total) by mouth daily at 6 PM. 90 tablet 3  . clopidogrel (PLAVIX) 75 MG tablet Take 1 tablet (75 mg total) by mouth daily. 30 tablet 3  . fluticasone (FLONASE) 50 MCG/ACT nasal spray Place 2 sprays into both nostrils daily. 16 g 0  . hydrALAZINE (APRESOLINE) 50 MG tablet TAKE 1 TABLET BY MOUTH 3 TIMES DAILY WITH MEALS. 90 tablet 1  . lisinopril (PRINIVIL,ZESTRIL) 20 MG tablet TAKE 1 TABLET (20 MG TOTAL) BY MOUTH 2 (TWO) TIMES DAILY. 60 tablet 2  . metoprolol (LOPRESSOR) 50 MG tablet Take 1 tablet (50 mg total) by mouth 2 (two) times daily. 60 tablet 3  . spironolactone (ALDACTONE) 25 MG tablet Take 1 tablet (25 mg total) by mouth daily. 30 tablet 0  . traMADol (ULTRAM) 50 MG tablet Take 1 tablet (50 mg total) by mouth every 8 (eight) hours as needed for severe pain. 30 tablet 0  . bacitracin-neomycin-polymyxin-hydrocortisone (CORTISPORIN) 1 % ointment Apply 1 application topically 2 (two) times daily. (Patient taking differently: Apply 1 application topically 2 (two) times daily as needed (rash). ) 15 g 0  . gabapentin (NEURONTIN) 300 MG capsule Take 1 capsule (300 mg total) by mouth at bedtime. 30 capsule 2  . ranitidine (ZANTAC) 150 MG tablet Take 1 tablet (150 mg total) by mouth 2 (two) times daily.    [name] venlafaxine XR (EFFEXOR-XR) 75 MG 24 hr capsule TAKE 1 CAPSULE BY MOUTH DAILY WITH BREAKFAST. 30 capsule 1   No facility-administered medications prior to visit.     ROS Review of Systems  HENT: Negative.   Eyes: Negative.  Respiratory: Positive for cough.   Cardiovascular: Negative.   Gastrointestinal: Positive for vomiting.  Musculoskeletal:       Weakness   Skin: Negative.   Neurological: Positive for weakness.  Psychiatric/Behavioral: Positive for dysphoric mood.    Objective:  BP 98/60 (BP Location: Left Arm, Patient Position: Sitting, Cuff Size: Normal)   Pulse 62   Temp 98 F (36.7 C) (Oral)   Resp 18   Ht 5\' 6"  (1.676 m)    Wt 176 lb 3.2 oz (79.9 kg)   SpO2 100%   BMI 28.44 kg/m   BP/Weight 11/17/2016 6/50/3546 10/05/8125  Systolic BP 517 98 95  Diastolic BP 60 60 57  Wt. (Lbs) 177 176.2 176.4  BMI 28.57 28.44 28.47  Some encounter information is confidential and restricted. Go to Review Flowsheets activity to see all data.     Physical Exam  Constitutional: She is oriented to person, place, and time. She appears well-developed and well-nourished.  HENT:  Head: Normocephalic and atraumatic.  Right Ear: External ear normal.  Left Ear: External ear normal.  Nose: Nose normal.  Mouth/Throat: Oropharynx is clear and moist.  Eyes: Conjunctivae are normal. Pupils are equal, round, and reactive to light.  Neck: Normal range of motion. Neck supple. No JVD present.  Cardiovascular: Normal rate, regular rhythm, normal heart sounds and intact distal pulses.   Pulmonary/Chest: Effort normal and breath sounds normal.  Abdominal: Soft. Bowel sounds are normal. There is no tenderness.  Musculoskeletal:  3/5 muscle strength LUE/LLE; 4/5 muscle strength RUE/RLL .    Lymphadenopathy:    She has no cervical adenopathy.  Neurological: She is alert and oriented to person, place, and time. Gait abnormal.  Reflex Scores:      Tricep reflexes are 2+ on the right side and 2+ on the left side.      Bicep reflexes are 2+ on the right side and 2+ on the left side.      Brachioradialis reflexes are 2+ on the right side and 3+ on the left side.      Patellar reflexes are 2+ on the right side and 3+ on the left side. slurred  Skin: Skin is warm and dry.  Psychiatric: She is slowed. She exhibits a depressed mood. She expresses no homicidal and no suicidal ideation. She expresses no suicidal plans and no homicidal plans.  Nursing note and vitals reviewed.    Assessment & Plan:   Problem List Items Addressed This Visit    None    Visit Diagnoses    Follow up    -  Primary   Nausea and vomiting, intractability of vomiting  not specified, unspecified vomiting type       Relevant Medications   ondansetron (ZOFRAN) 4 MG tablet   Other Relevant Orders   SLP modified barium swallow   Adjustment disorder with depressed mood       LSCW spoke with patient and provided resources.   Currently on Effexor for symptoms.   Relevant Orders   Ambulatory referral to Psychiatry   Neuropathy       Relevant Medications   gabapentin (NEURONTIN) 300 MG capsule   History of CVA with residual deficit       Relevant Orders   Ambulatory referral to Neurology   Coughing       Relevant Orders   SLP modified barium swallow      Meds ordered this encounter  Medications  . ondansetron (ZOFRAN) 4 MG tablet    Sig:  Take 1 tablet (4 mg total) by mouth every 8 (eight) hours as needed for nausea or vomiting.    Dispense:  20 tablet    Refill:  0    Order Specific Question:   Supervising Provider    Answer:   Tresa Garter [id number]  . gabapentin (NEURONTIN) 300 MG capsule    Sig: Take 1 capsule (300 mg total) by mouth 2 (two) times daily.    Dispense:  60 capsule    Refill:  2    Order Specific Question:   Supervising Provider    Answer:   Tresa Garter [[id number]]    Follow-up: Return in about 3 months (around 02/14/2017) for HTN/HLD.   [name]

## 2016-11-14 NOTE — Patient Instructions (Signed)
Soft-Food Meal Plan °A soft-food meal plan includes foods that are safe and easy to swallow. This meal plan typically is used: °· If you are having trouble chewing or swallowing foods. °· As a transition meal plan after only having had liquid meals for a long period. ° °What do I need to know about the soft-food meal plan? °A soft-food meal plan includes tender foods that are soft and easy to chew and swallow. In most cases, bite-sized pieces of food are easier to swallow. A bite-sized piece is about ½ inch or smaller. Foods in this plan do not need to be ground or pureed. °Foods that are very hard, crunchy, or sticky should be avoided. Also, breads, cereals, yogurts, and desserts with nuts, seeds, or fruits should be avoided. °What foods can I eat? °Grains °Rice and wild rice. Moist bread, dressing, pasta, and noodles. Well-moistened dry or cooked cereals, such as farina (cooked wheat cereal), oatmeal, or grits. Biscuits, breads, muffins, pancakes, and waffles that have been well moistened. °Vegetables °Shredded lettuce. Cooked, tender vegetables, including potatoes without skins. Vegetable juices. Broths or creamed soups made with vegetables that are not stringy or chewy. Strained tomatoes (without seeds). °Fruits °Canned or well-cooked fruits. Soft (ripe), peeled fresh fruits, such as peaches, nectarines, kiwi, cantaloupe, honeydew melon, and watermelon (without seeds). Soft berries with small seeds, such as strawberries. Fruit juices (without pulp). °Meats and Other Protein Sources °Moist, tender, lean beef. Mutton. Lamb. Veal. Chicken. Turkey. Liver. Ham. Fish without bones. Eggs. °Dairy °Milk, milk drinks, and cream. Plain cream cheese and cottage cheese. Plain yogurt. °Sweets/Desserts °Flavored gelatin desserts. Custard. Plain ice cream, frozen yogurt, sherbet, milk shakes, and malts. Plain cakes and cookies. Plain hard candy. °Other °Butter, margarine (without trans fat), and cooking oils. Mayonnaise. Cream  sauces. Mild spices, salt, and sugar. Syrup, molasses, honey, and jelly. °The items listed above may not be a complete list of recommended foods or beverages. Contact your dietitian for more options. °What foods are not recommended? °Grains °Dry bread, toast, crackers that have not been moistened. Coarse or dry cereals, such as bran, granola, and shredded wheat. Tough or chewy crusty breads, such as French bread or baguettes. °Vegetables °Corn. Raw vegetables except shredded lettuce. Cooked vegetables that are tough or stringy. Tough, crisp, fried potatoes and potato skins. °Fruits °Fresh fruits with skins or seeds or both, such as apples, pears, or grapes. Stringy, high-pulp fruits, such as papaya, pineapple, coconut, or mango. Fruit leather, fruit roll-ups, and all dried fruits. °Meats and Other Protein Sources °Sausages and hot dogs. Meats with gristle. Fish with bones. Nuts, seeds, and chunky peanut or other nut butters. °Sweets/Desserts °Cakes or cookies that are very dry or chewy. °The items listed above may not be a complete list of foods and beverages to avoid. Contact your dietitian for more information. °This information is not intended to replace advice given to you by your health care provider. Make sure you discuss any questions you have with your health care provider. °Document Released: 08/26/2007 Document Revised: 10/25/2015 Document Reviewed: 04/15/2013 °Elsevier Interactive Patient Education © 2017 [employer] ° °

## 2016-11-14 NOTE — Progress Notes (Signed)
Patient is here for 2 week f/up  Patient stated that her left leg shake constantly

## 2016-11-14 NOTE — BH Specialist Note (Signed)
Integrated Behavioral Health Initial Visit  MRN: [id number] Name: Brandy Roberts   Session Start time: 10:40 AM Session End time: 11:15 AM Total time: 35 minutes  Type of Service: [place] Interpretor:No. Interpretor Name and Language: N/A   Warm Hand Off Completed.       SUBJECTIVE: Brandy Roberts is a 46 y.o. female accompanied by patient. Patient was referred by FNP [place] for depression. Patient reports the following symptoms/concerns: feelings of sadness and worry, irritability, and suicidal ideations Duration of problem: Ongoing; Severity of problem: moderate  OBJECTIVE: Mood: Depressed and Affect: Appropriate Risk of harm to self or others: Suicidal ideation No plan to harm self or others   LIFE CONTEXT: Family and Social: Pt receives support from a friend and uncle who resides nearby. She is receiving OT, PT, and Speech Therapy through [facility] School/Work: Pt receives disability [phone]) and plans to apply for food stamps Self-Care: Pt smokes marijuana occassionally Life Changes: Pt was recently hospitalized for stroke and is having financial strain   GOALS ADDRESSED: Patient will reduce symptoms of: depression and increase knowledge and/or ability of: coping skills and also: Increase adequate support systems for patient/family   INTERVENTIONS: Solution-Focused Strategies, Supportive Counseling, Psychoeducation and/or Health Education and Link to [employer]  Standardized Assessments completed: PHQ 2&9 with C-SSRS  ASSESSMENT: Patient currently experiencing depression triggered by ongoing medical concerns and financial strain. She reports feelings of sadness and worry, irritability, and suicidal ideations. Patient denies intent or plan to harm self or others and receives emotional support from partner and family. She is receiving OT, PT, and Speech Therapy through [facility]. Patient may benefit from psychoeducation,  psychotherapy, and medication management. [place] educated pt on how stress can negatively impact one's physical and mental health. LCSWA inquired about protective factors and discussed crisis intervention with pt. Pt was successful in identifying healthy coping skills to manage symptoms. She participates in medication management through Neurologist. LCSWA provided pt with resources for food instability and counseling. Pt agreed to [facility] referral and will complete CAP application for additional in home services.  PLAN: 1. Follow up with behavioral health clinician on : Pt was encouraged to contact [place] if symptoms worsen or fail to improve to schedule behavioral appointments at [facility]. 2. Behavioral recommendations: LCSWA recommends that pt apply healthy coping skills discussed, comply with medication management, complete CAP application, and utilize provided resources. Pt is encouraged to schedule follow up appointment with LCSWA 3. Referral(s): Community Resources:  Haematologist, Acupuncturist 4. "From scale of 1-10, how likely are you to follow plan?": 9/10  Rebekah [place], LCSW 11/14/16 3:33 PM

## 2016-11-17 ENCOUNTER — Ambulatory Visit (INDEPENDENT_AMBULATORY_CARE_PROVIDER_SITE_OTHER): Payer: Medicaid Other | Admitting: Neurology

## 2016-11-17 ENCOUNTER — Encounter: Payer: Self-pay | Admitting: Neurology

## 2016-11-17 VITALS — BP 118/60 | HR 61 | Ht 66.0 in | Wt 177.0 lb

## 2016-11-17 DIAGNOSIS — G43009 Migraine without aura, not intractable, without status migrainosus: Secondary | ICD-10-CM

## 2016-11-17 DIAGNOSIS — F17210 Nicotine dependence, cigarettes, uncomplicated: Secondary | ICD-10-CM

## 2016-11-17 DIAGNOSIS — F329 Major depressive disorder, single episode, unspecified: Secondary | ICD-10-CM

## 2016-11-17 DIAGNOSIS — F32A Depression, unspecified: Secondary | ICD-10-CM

## 2016-11-17 DIAGNOSIS — I10 Essential (primary) hypertension: Secondary | ICD-10-CM | POA: Diagnosis not present

## 2016-11-17 DIAGNOSIS — E785 Hyperlipidemia, unspecified: Secondary | ICD-10-CM

## 2016-11-17 DIAGNOSIS — I69959 Hemiplegia and hemiparesis following unspecified cerebrovascular disease affecting unspecified side: Secondary | ICD-10-CM

## 2016-11-17 MED ORDER — NICOTINE 14 MG/24HR TD PT24: 14.0000 mg | MEDICATED_PATCH | Freq: Every day | TRANSDERMAL | 0 refills | Status: DC

## 2016-11-17 MED ORDER — VENLAFAXINE HCL ER 150 MG PO CP24: 150.0000 mg | ORAL_CAPSULE | Freq: Every day | ORAL | 5 refills | Status: DC

## 2016-11-17 MED FILL — ONDANSETRON HCL 4 MG TABLET: 4 | 6 days supply | Qty: 20 | Fill #0

## 2016-11-17 MED FILL — VENLAFAXINE HCL ER 150 MG C: 150 | 30 days supply | Qty: 30 | Fill #0

## 2016-11-17 NOTE — Progress Notes (Signed)
NEUROLOGY FOLLOW UP OFFICE NOTE  [name] [id number]  HISTORY OF PRESENT ILLNESS: Brandy Roberts is a 46 year old right-handed female with HTN, hyperlipidemia, and history of stroke who follows up for stroke and migraine.  She is accompanied by her sister who supplements history.  UPDATE: Recent Stroke-like event: She was admitted to [facility] from 10/24/16 to 10/26/16 for stroke.  She suddenly developed left sided weakness and slurred speech with associated headache.  Blood pressure in ED was 120/66.  CT of head was personally reviewed and negative for acute abnormality.  She had refused tPA.  MRI of brain was personally reviewed and revealed no acute stroke or bleed.  MRA of head revealed no significant intracranial stenosis or occlusion.  LDL was 84.  Hgb A1c was 5.7.  As per neurology evaluation, her deficits appeared to be non-organic, with unusual speech pattern and giveaway weakness.  Somatization was suspected.  2D echo from 11/06/16 demonstrated normal LV EF of 65-70% with no cardiac source of emboli.  Atorvastatin was increased from 10mg  to 80mg  daily.  Migraines are controlled.   Intensity:  8/10 Duration:  2 hours Frequency:  Once or twice a month Frequency of abortive medication: infrequent Current NSAIDS:  no Current analgesics: Fioricet Current triptans:  no Current anti-emetic:  no Current muscle relaxants:  no Current anti-anxiolytic:  no Current sleep aide:  no Current Antihypertensive medications:  amlodipine 5mg , hydralazine 50mg  three times daily, HCTZ, spironolactone, Lisinopril, metoprolol 50mg  Current Antidepressant medications:  venlafaxine XR 75mg  Current Anticonvulsant medications:  gabapentin 300mg  Current Vitamins/Herbal/Supplements:  no Current Antihistamines/Decongestants:  no Other therapy:  no   Caffeine:  Coffee, sweet tea Alcohol:  no Smoker:  Yes (cigarettes) Diet:  hydrates Exercise:  no Depression/anxiety:  Depression  since the stroke.  She reports increased moodiness. Sleep hygiene:  Sometimes tosses and turns  HISTORY:  Onset:  Migraines since she was young, but worse since her stroke in May 2016. Location:  holocephalic Quality:  pounding Initial Intensity:  10/10 Aura:  no Prodrome:  no Postdrome:  no Associated symptoms:  Photophobia, phonophobia.  Rarely nausea.  She has not had any new worse headache of her life, waking up from sleep Initial Duration:  2 days (but Fioricet and ibuprofen lowers intensity down to 2-3/10) Initial Frequency:  Once or twice a month Frequency of abortive medication: only as needed Triggers/exacerbating factors:  no Relieving factors:  Laying down.  Butalbital, ibuprofen.  Tramadol helped but she was told not to take it. Activity:  aggravates   Past NSAIDS:  Ibuprofen (advised to stop NSAIDs as she is on Plavix) Past analgesics:  Tylenol, Excedrin, Tramadol (effective) Past abortive triptans:  no Past muscle relaxants:  no Past anti-emetic:  no Past antihypertensive medications:  no Past antidepressant medications:  no Past anticonvulsant medications:  no Past vitamins/Herbal/Supplements:  no Other past therapies:  no   Family history of headache:  Sister.   07/24/16 Labs:  CBC with WBC 6.7, HGB 12.6, HCT 38.7, PLT 228; CMP with Na 140, K 4, Cl 105, CO2 25, glucose 88, BUN 24, Cr 1.01, total bili 0.4, ALP 62, AST 16 and ALT 16.   She had a stroke in May 2016 with left sided weakness.  MRI of brain from 10/06/14 was personally reviewed and revealed small acute lacunar infarcts in the right thalamus and right corona radiata, as well as chronic lacunar infarcts in the left hemisphere.  CTA of head and neck revealed diffuse bilateral petrous  and cavernous carotid stenosis.  Cardiac source of embolus was not discovered.    PAST MEDICAL HISTORY: Past Medical History:  Diagnosis Date  . Headache   . Hyperlipidemia   . Hypertension   . Stroke [facility])      MEDICATIONS: Current Outpatient Prescriptions on File Prior to Visit  Medication Sig Dispense Refill  . amLODipine (NORVASC) 10 MG tablet Take 0.5 tablets (5 mg total) by mouth daily. 30 tablet 3  . atorvastatin (LIPITOR) 80 MG tablet Take 1 tablet (80 mg total) by mouth daily at 6 PM. 90 tablet 3  . clopidogrel (PLAVIX) 75 MG tablet Take 1 tablet (75 mg total) by mouth daily. 30 tablet 3  . fluticasone (FLONASE) 50 MCG/ACT nasal spray Place 2 sprays into both nostrils daily. 16 g 0  . gabapentin (NEURONTIN) 300 MG capsule Take 1 capsule (300 mg total) by mouth 2 (two) times daily. 60 capsule 2  . hydrALAZINE (APRESOLINE) 50 MG tablet TAKE 1 TABLET BY MOUTH 3 TIMES DAILY WITH MEALS. 90 tablet 1  . lisinopril (PRINIVIL,ZESTRIL) 20 MG tablet TAKE 1 TABLET (20 MG TOTAL) BY MOUTH 2 (TWO) TIMES DAILY. 60 tablet 2  . metoprolol (LOPRESSOR) 50 MG tablet Take 1 tablet (50 mg total) by mouth 2 (two) times daily. 60 tablet 3  . ondansetron (ZOFRAN) 4 MG tablet Take 1 tablet (4 mg total) by mouth every 8 (eight) hours as needed for nausea or vomiting. 20 tablet 0  . spironolactone (ALDACTONE) 25 MG tablet Take 1 tablet (25 mg total) by mouth daily. 30 tablet 0  . traMADol (ULTRAM) 50 MG tablet Take 1 tablet (50 mg total) by mouth every 8 (eight) hours as needed for severe pain. 30 tablet 0   No current facility-administered medications on file prior to visit.     ALLERGIES: Allergies  Allergen Reactions  . Tylenol [Acetaminophen] Other (See Comments)    Pt stated tylenol gives her extreme headache    FAMILY HISTORY: Family History  Problem Relation Age of Onset  . Cancer Mother   . Hypertension Mother   . Hypertension Sister     SOCIAL HISTORY: Social History   Social History  . Marital status: Single    Spouse name: N/A  . Number of children: 2  . Years of education: 12   Occupational History  . N/A    Social History Main Topics  . Smoking status: Former Smoker     Packs/day: 0.25  . Smokeless tobacco: Never Used  . Alcohol use No  . Drug use: No  . Sexual activity: Not on file   Other Topics Concern  . Not on file   Social History Narrative   Patient lives with her uncle in a one story home.  Has 2 sons.  Currently on disability.  Education: high school.   Drinks 1-2 sodas a week     REVIEW OF SYSTEMS: Constitutional: No fevers, chills, or sweats, no generalized fatigue, change in appetite Eyes: No visual changes, double vision, eye pain Ear, nose and throat: No hearing loss, ear pain, nasal congestion, sore throat Cardiovascular: No chest pain, palpitations Respiratory:  No shortness of breath at rest or with exertion, wheezes GastrointestinaI: No nausea, vomiting, diarrhea, abdominal pain, fecal incontinence Genitourinary:  No dysuria, urinary retention or frequency Musculoskeletal:  No neck pain, back pain Integumentary: No rash, pruritus, skin lesions Neurological: as above Psychiatric: No depression, insomnia, anxiety Endocrine: No palpitations, fatigue, diaphoresis, mood swings, change in appetite, change in weight, increased  thirst Hematologic/Lymphatic:  No purpura, petechiae. Allergic/Immunologic: no itchy/runny eyes, nasal congestion, recent allergic reactions, rashes  PHYSICAL EXAM: Vitals:   11/17/16 0937  BP: 118/60  Pulse: 61   General: No acute distress.  Patient appears well-groomed.   Head:  Normocephalic/atraumatic Eyes:  Fundi examined but not visualized Neck: supple, no paraspinal tenderness, full range of motion Heart:  Regular rate and rhythm Lungs:  Clear to auscultation bilaterally Back: No paraspinal tenderness Neurological Exam: MS:  alert and oriented to person, place, and time. Attention span and concentration intact, recent and remote memory intact, fund of knowledge intact.  Speech fluent and not dysarthric, language intact.  CN:   II-XII intact. Muscle:  Bulk and tone normal, muscle strength Motor:   5-/5 left deltoid and bicep, 4+/5 left tricep, hand grip and left lower extremity.  5/5 on right.  Reduced amplitude and speed of finger tapping on left, but arrhythmic.  Sensation: Mildly reduced temperature and vibration sensation intact in left upper and lower extremities.  Deep Tendon Reflexes:  3+ throughout, slightly more brisk on the left, toes downgoing.  Finger to nose testing:  Without dysmetria.  Slowed on the left.  Heel to shin:  Without dysmetria.  Slowed on the left.  Gait:  Left hemiparetic gait.  Able to turn, unable to tandem walk. Romberg with sway.  IMPRESSION: 1  Stroke-like event with worsening left-sided weakness.  MRI negative for stroke.  Differential includes TIA vs hemiplegic migraine vs Somatization.  Finger tapping of both hands revealed the slowed speed and amplitude on the left was not rhythmic, suggesting somatoform.  2  Migraine without aura. 3  Hemiplegia of left side as late effect of stroke 4  Depression 5  Hypertension 6  Hyperlipidemia 7  Cigarette smoker  PLAN: 1.  To help with mood, we will increase venlafaxine XR to 150mg  daily 2.  I will prescribe Nicoderm patch (14mg ) to help with smoking cessation.  Refills should be ordered by your PCP 3.  Continue Plavix, atorvastatin 80mg  daily, blood pressure medication 4.  Use Fioricet for migraines.  Try not to use tramadol 5.  Continue gabapentin 300mg  twice daily 6.  Follow up in 5 months.  Metta Clines, DO  CC:  Fredia Beets, FNP

## 2016-11-17 NOTE — Patient Instructions (Signed)
1.  To help with mood, we will increase venlafaxine XR to 150mg  daily 2.  I will prescribe you the Nicoderm patch to help you stop smoking.  Change patch daily.  Refills should be ordered by your PCP 3.  Continue Plavix, atorvastatin 80mg  daily, blood pressure medication 4.  Use Fioricet for migraines.  Try not to use tramadol 5.  Continue gabapentin 300mg  twice daily

## 2016-11-19 MED FILL — BUTALB-ACETAMIN-CAFF 50-325: 50-325-40 | 3 days supply | Qty: 15 | Fill #1

## 2016-11-19 MED FILL — NICOTINE 14 MG/24HR PATCH: 14 | 28 days supply | Qty: 28 | Fill #0

## 2016-11-20 ENCOUNTER — Encounter: Payer: Self-pay | Admitting: *Deleted

## 2016-11-20 ENCOUNTER — Ambulatory Visit: Payer: Medicaid Other | Admitting: Obstetrics and Gynecology

## 2016-11-20 NOTE — Progress Notes (Signed)
Pt did not keep todays appt. Per Dr. Rip Harbour we do not need to call patient.

## 2016-11-23 ENCOUNTER — Encounter (HOSPITAL_COMMUNITY): Payer: Self-pay | Admitting: Emergency Medicine

## 2016-11-23 ENCOUNTER — Emergency Department (HOSPITAL_COMMUNITY)
Admission: EM | Admit: 2016-11-23 | Discharge: 2016-11-23 | Disposition: A | Discharge status: Home / Self Care | Payer: Medicaid Other | Attending: Emergency Medicine | Admitting: Emergency Medicine

## 2016-11-23 DIAGNOSIS — R51 Headache: Secondary | ICD-10-CM | POA: Insufficient documentation

## 2016-11-23 DIAGNOSIS — I10 Essential (primary) hypertension: Secondary | ICD-10-CM | POA: Diagnosis not present

## 2016-11-23 DIAGNOSIS — Z8673 Personal history of transient ischemic attack (TIA), and cerebral infarction without residual deficits: Secondary | ICD-10-CM | POA: Diagnosis not present

## 2016-11-23 DIAGNOSIS — Z7902 Long term (current) use of antithrombotics/antiplatelets: Secondary | ICD-10-CM | POA: Insufficient documentation

## 2016-11-23 DIAGNOSIS — R531 Weakness: Secondary | ICD-10-CM | POA: Diagnosis present

## 2016-11-23 DIAGNOSIS — Z87891 Personal history of nicotine dependence: Secondary | ICD-10-CM | POA: Diagnosis not present

## 2016-11-23 DIAGNOSIS — R519 Headache, unspecified: Secondary | ICD-10-CM

## 2016-11-23 DIAGNOSIS — Z79899 Other long term (current) drug therapy: Secondary | ICD-10-CM | POA: Diagnosis not present

## 2016-11-23 MED ORDER — SODIUM CHLORIDE 0.9 % IV BOLUS (SEPSIS)
1000.0000 mL | Freq: Once | INTRAVENOUS | Status: AC
  Administered 2016-11-23: 1000 mL via INTRAVENOUS

## 2016-11-23 MED ORDER — MORPHINE SULFATE (PF) 4 MG/ML IV SOLN
4.0000 mg | Freq: Once | INTRAVENOUS | Status: AC
  Administered 2016-11-23: 4 mg via INTRAVENOUS
  Filled 2016-11-23: qty 1

## 2016-11-23 MED ORDER — METOCLOPRAMIDE HCL 5 MG/ML IJ SOLN
10.0000 mg | Freq: Once | INTRAMUSCULAR | Status: AC
  Administered 2016-11-23: 10 mg via INTRAVENOUS
  Filled 2016-11-23: qty 2

## 2016-11-23 MED ORDER — DEXAMETHASONE SODIUM PHOSPHATE 10 MG/ML IJ SOLN
10.0000 mg | Freq: Once | INTRAMUSCULAR | Status: AC
  Administered 2016-11-23: 10 mg via INTRAVENOUS
  Filled 2016-11-23: qty 1

## 2016-11-23 MED ORDER — DIPHENHYDRAMINE HCL 50 MG/ML IJ SOLN
25.0000 mg | Freq: Once | INTRAMUSCULAR | Status: AC
  Administered 2016-11-23: 25 mg via INTRAVENOUS
  Filled 2016-11-23: qty 1

## 2016-11-23 NOTE — ED Triage Notes (Signed)
Pt to ER for evaluation of headache onset at 55 oclock. States worsened left sided weakness but previous stroke in 2016. NAD. VSS.

## 2016-11-23 NOTE — ED Notes (Signed)
Patient able to ambulate independently  

## 2016-11-23 NOTE — ED Provider Notes (Signed)
[facility] DEPT Provider Note   CSN: [id number] Arrival date & time: 11/23/16  1401     History   Chief Complaint Chief Complaint  Patient presents with  . Weakness    HPI Amiliah Campisi is a 46 y.o. female.  HPI   46 year old female with history of prior stroke with residual left-sided weakness, recurrent headache, hypertension, brought here via EMS from home for evaluation of headache. Patient states she gets headaches on a weekly basis. She developed gradual onset of sharp throbbing frontal headache that started since yesterday and has progressed throughout the day today. She endorses nausea and vomiting yesterday but that has since resolved. Her headache is moderate in severity with light and sound sensitivity. Headache feels similar to prior headaches more intense. She has history of stroke with left-sided weakness in which she is currently receiving physical therapy. She walks with a walker. She denies active fever, chills, diplopia, URI symptoms, chest pain, trouble breathing, abdominal pain, neck stiffness, or rash. No specific treatment tried.  Past Medical History:  Diagnosis Date  . Headache   . Hyperlipidemia   . Hypertension   . Stroke [facility])     Patient Active Problem List   Diagnosis Date Noted  . CVA (cerebral vascular accident) ([place]) 10/25/2016  . Post-menopausal bleeding 09/24/2016  . OSA (obstructive sleep apnea) 09/03/2015  . Heart valve vegetation 09/03/2015  . Snoring 05/02/2015  . Essential hypertension 05/02/2015  . Tobacco use disorder 05/02/2015  . Headache 05/01/2015  . Dysphagia, post-stroke 02/20/2015  . Former smoker 01/24/2015  . Alteration of sensations, post-stroke 12/22/2014  . Cognitive deficit, post-stroke 11/23/2014  . Left hemiparesis ([place]) 10/23/2014  . Cardiomyopathy due to hypertension ([place]) 10/23/2014  . Chronic ischemic vertebrobasilar artery thalamic stroke   . Aortic valve vegetation   . Hypertensive heart disease   .  Overweight (BMI 25.0-29.9)   . H/O noncompliance with medical treatment, presenting hazards to health   . Aortic regurgitation   . Cerebral infarction due to thrombosis of right middle cerebral artery ([place])   . Hyperlipidemia   . Embolic stroke involving right middle cerebral artery ([place]) 10/06/2014    Past Surgical History:  Procedure Laterality Date  . CESAREAN SECTION    . GANGLION CYST EXCISION    . TEE WITHOUT CARDIOVERSION N/A 10/09/2014   Procedure: TRANSESOPHAGEAL ECHOCARDIOGRAM (TEE);  Surgeon: Sanda Klein, MD;  Location: [facility] ENDOSCOPY;  Service: Cardiovascular;  Laterality: N/A;  . TONSILLECTOMY      OB History    No data available       Home Medications    Prior to Admission medications   Medication Sig Start Date End Date Taking? Authorizing Provider  amLODipine (NORVASC) 10 MG tablet Take 0.5 tablets (5 mg total) by mouth daily. 03/31/16   Kirichenko, Lahoma Rocker, PA-C  atorvastatin (LIPITOR) 80 MG tablet Take 1 tablet (80 mg total) by mouth daily at 6 PM. 11/10/16   [name], FNP  clopidogrel (PLAVIX) 75 MG tablet Take 1 tablet (75 mg total) by mouth daily. 09/02/16   [name], FNP  fluticasone (FLONASE) 50 MCG/ACT nasal spray Place 2 sprays into both nostrils daily. 10/27/16   Johnson, Clanford L, MD  gabapentin (NEURONTIN) 300 MG capsule Take 1 capsule (300 mg total) by mouth 2 (two) times daily. 11/14/16   [name], FNP  hydrALAZINE (APRESOLINE) 50 MG tablet TAKE 1 TABLET BY MOUTH 3 TIMES DAILY WITH MEALS. 10/02/16   Hairston, Maylon Peppers, FNP  lisinopril (PRINIVIL,ZESTRIL) 20  MG tablet TAKE 1 TABLET (20 MG TOTAL) BY MOUTH 2 (TWO) TIMES DAILY. 08/27/16   [name], FNP  metoprolol (LOPRESSOR) 50 MG tablet Take 1 tablet (50 mg total) by mouth 2 (two) times daily. 09/02/16   [name], FNP  nicotine (NICODERM CQ) 14 mg/24hr patch Place 1 patch (14 mg total) onto the skin daily. 11/17/16   Tomi Likens, Adam R, DO  ondansetron  (ZOFRAN) 4 MG tablet Take 1 tablet (4 mg total) by mouth every 8 (eight) hours as needed for nausea or vomiting. 11/14/16   [name], FNP  spironolactone (ALDACTONE) 25 MG tablet Take 1 tablet (25 mg total) by mouth daily. 11/06/16   [name], FNP  traMADol (ULTRAM) 50 MG tablet Take 1 tablet (50 mg total) by mouth every 8 (eight) hours as needed for severe pain. 10/31/16   [name], FNP  venlafaxine XR (EFFEXOR-XR) 150 MG 24 hr capsule Take 1 capsule (150 mg total) by mouth daily with breakfast. 11/17/16   Pieter Partridge, DO    Family History Family History  Problem Relation Age of Onset  . Cancer Mother   . Hypertension Mother   . Hypertension Sister     Social History Social History  Substance Use Topics  . Smoking status: Former Smoker    Packs/day: 0.25  . Smokeless tobacco: Never Used  . Alcohol use No     Allergies   Tylenol [acetaminophen]   Review of Systems Review of Systems  All other systems reviewed and are negative.    Physical Exam Updated Vital Signs BP 110/61 (BP Location: Right Arm)   Pulse 62   Temp 99.4 F (37.4 C) (Oral)   Resp 16   SpO2 97%   Physical Exam  Constitutional: She appears well-developed and well-nourished. No distress.  HENT:  Head: Atraumatic.  Mouth: Edentulous  Eyes: Conjunctivae and EOM are normal. Pupils are equal, round, and reactive to light.  Neck: Normal range of motion. Neck supple.  No nuchal rigidity  Cardiovascular: Normal rate and regular rhythm.   Pulmonary/Chest: Effort normal and breath sounds normal.  Neurological: She is alert. A sensory deficit is present. GCS eye subscore is 4. GCS verbal subscore is 5. GCS motor subscore is 6.  Chronic left sided weakness evidence on exam. Decrease sensation from light touch on left side of body compared to right, this is chronic. Gait not tested.  Skin: No rash noted.  Psychiatric: She has a normal mood and affect.  Nursing note and vitals  reviewed.    ED Treatments / Results  Labs (all labs ordered are listed, but only abnormal results are displayed) Labs Reviewed - No data to display  EKG  EKG Interpretation None       Radiology No results found.  Procedures Procedures (including critical care time)  Medications Ordered in ED Medications  morphine 4 MG/ML injection 4 mg (4 mg Intravenous Given 11/23/16 1430)  metoCLOPramide (REGLAN) injection 10 mg (10 mg Intravenous Given 11/23/16 1430)  diphenhydrAMINE (BENADRYL) injection 25 mg (25 mg Intravenous Given 11/23/16 1431)  sodium chloride 0.9 % bolus 1,000 mL (1,000 mLs Intravenous New Bag/Given 11/23/16 1430)  dexamethasone (DECADRON) injection 10 mg (10 mg Intravenous Given 11/23/16 1430)     Initial Impression / Assessment and Plan / ED Course  I have reviewed the triage vital signs and the nursing notes.  Pertinent labs & imaging results that were available during my care of the patient were reviewed  by me and considered in my medical decision making (see chart for details).     BP 110/61 (BP Location: Right Arm)   Pulse 62   Temp 99.4 F (37.4 C) (Oral)   Resp 16   SpO2 97%    Final Clinical Impressions(s) / ED Diagnoses   Final diagnoses:  Bad headache    New Prescriptions New Prescriptions   No medications on file   acute on chronic headache.  No red flags.  Hx of recurrent stroke but no new focal neuro deficit on today's exam.  Will provide migraine cocktail.  Pt otherwise well appearing, I do know believe head ct is indicated at this time, doubt SAH or space occupying lesion.    3:36 PM Pt felt better after receiving migraine cocktail.  She feels comfortable going home.  She will f/u with her PCP for further care.  Return precaution given.    Domenic Moras, PA-C 11/23/16 1536    Fredia Sorrow, MD 11/30/16 [phone]

## 2016-11-24 ENCOUNTER — Telehealth: Payer: Self-pay | Admitting: Family Medicine

## 2016-11-24 NOTE — Telephone Encounter (Signed)
Caller requesting a referral to [facility] for PT, OT, and speech for pt. Please f/u. Thank you.

## 2016-11-25 ENCOUNTER — Other Ambulatory Visit: Payer: Self-pay | Admitting: Family Medicine

## 2016-11-25 DIAGNOSIS — I6930 Unspecified sequelae of cerebral infarction: Secondary | ICD-10-CM

## 2016-11-25 NOTE — Telephone Encounter (Signed)
Pt. Came to facility requesting a letter from her PCP stating she is disabled. Pt. States she needs the letter for her food stamps. Pt. Also stated that she had asked her PCP for the letter on her last office visit on 11/14/16. Please f/u

## 2016-11-26 ENCOUNTER — Telehealth: Payer: Self-pay

## 2016-11-26 NOTE — Telephone Encounter (Signed)
Letter was given to [facility] yesterday in office, please follow up with front office.

## 2016-11-26 NOTE — Telephone Encounter (Signed)
Error

## 2016-11-26 NOTE — Telephone Encounter (Signed)
Oh ok sorry.

## 2016-11-26 NOTE — Telephone Encounter (Signed)
Call placed to [facility], spoke to [place] who confirmed receipt of the referral.  She said that they have been trying to contact her but have been unsuccessful so far.

## 2016-11-26 NOTE — Telephone Encounter (Signed)
Pt. Came to facility requesting a letter from her PCP stating she is disabled. Pt. States she needs the letter for her food stamps. Pt. Also stated that she had asked her PCP for the letter on her last office visit on 11/14/16. Please advice?

## 2016-11-27 ENCOUNTER — Ambulatory Visit (HOSPITAL_COMMUNITY)
Admission: RE | Admit: 2016-11-27 | Discharge: 2016-11-27 | Disposition: A | Discharge status: Home / Self Care | Payer: Medicaid Other | Source: Ambulatory Visit | Attending: Cardiology | Admitting: Cardiology

## 2016-11-27 ENCOUNTER — Other Ambulatory Visit (HOSPITAL_COMMUNITY): Payer: Self-pay | Admitting: Family Medicine

## 2016-11-27 DIAGNOSIS — I6523 Occlusion and stenosis of bilateral carotid arteries: Secondary | ICD-10-CM | POA: Diagnosis not present

## 2016-11-27 DIAGNOSIS — I6930 Unspecified sequelae of cerebral infarction: Secondary | ICD-10-CM | POA: Insufficient documentation

## 2016-11-27 DIAGNOSIS — R1310 Dysphagia, unspecified: Secondary | ICD-10-CM

## 2016-11-27 DIAGNOSIS — Z09 Encounter for follow-up examination after completed treatment for conditions other than malignant neoplasm: Secondary | ICD-10-CM | POA: Insufficient documentation

## 2016-11-28 ENCOUNTER — Telehealth: Payer: Self-pay | Admitting: Licensed Clinical Social Worker

## 2016-11-28 NOTE — Telephone Encounter (Signed)
LCSWA contacted pt via telephone. Pt was informed of completion of [facility] referral and [facility]'s attempts to contact pt to schedule assessment.   Pt stated that she will follow-up with [facility] at her earliest convenience. No additional concerns noted.

## 2016-12-04 ENCOUNTER — Telehealth: Payer: Self-pay

## 2016-12-04 NOTE — Progress Notes (Signed)
Spoke with patient & she is requesting a prescription for pull ups/diapers she is doing a lot  of wet bedding

## 2016-12-04 NOTE — Telephone Encounter (Signed)
CMA call regarding results   Patient Verify DOB   Patient was aware and understood  

## 2016-12-04 NOTE — Telephone Encounter (Signed)
-----   Message from [name], [place] sent at 12/02/2016  9:15 AM EDT ----- Imaging show no blockages or decreased flow to the vessles.

## 2016-12-08 ENCOUNTER — Other Ambulatory Visit: Payer: Self-pay | Admitting: Family Medicine

## 2016-12-08 ENCOUNTER — Ambulatory Visit: Payer: Medicaid Other | Admitting: Neurology

## 2016-12-08 DIAGNOSIS — I10 Essential (primary) hypertension: Secondary | ICD-10-CM

## 2016-12-08 MED FILL — LISINOPRIL 20 MG TABLET: 20 | 30 days supply | Qty: 60 | Fill #0

## 2016-12-08 MED FILL — METOPROLOL TARTRATE 50 MG T: 50 | 30 days supply | Qty: 60 | Fill #3

## 2016-12-08 MED FILL — hydrALAZINE HCL 50 MG TABS: 50 | 30 days supply | Qty: 90 | Fill #0 | Status: TO

## 2016-12-08 MED FILL — SPIRONOLACTONE 25 MG TABLET: 25 | 30 days supply | Qty: 30 | Fill #0

## 2016-12-08 MED FILL — AMLODIPINE BESYLATE 10 MG T: 10 | 30 days supply | Qty: 15 | Fill #6

## 2016-12-08 MED FILL — GABAPENTIN 300 MG CAPSULE: 300 | 30 days supply | Qty: 60 | Fill #0

## 2016-12-09 ENCOUNTER — Other Ambulatory Visit: Payer: Self-pay | Admitting: Family Medicine

## 2016-12-09 ENCOUNTER — Ambulatory Visit (HOSPITAL_COMMUNITY)
Admission: RE | Admit: 2016-12-09 | Discharge: 2016-12-09 | Disposition: A | Discharge status: Home / Self Care | Payer: Medicaid Other | Source: Ambulatory Visit | Attending: Family Medicine | Admitting: Family Medicine

## 2016-12-09 ENCOUNTER — Telehealth: Payer: Self-pay | Admitting: Family Medicine

## 2016-12-09 ENCOUNTER — Telehealth: Payer: Self-pay

## 2016-12-09 DIAGNOSIS — R112 Nausea with vomiting, unspecified: Secondary | ICD-10-CM | POA: Diagnosis not present

## 2016-12-09 DIAGNOSIS — R05 Cough: Secondary | ICD-10-CM | POA: Diagnosis present

## 2016-12-09 DIAGNOSIS — R1310 Dysphagia, unspecified: Secondary | ICD-10-CM | POA: Insufficient documentation

## 2016-12-09 DIAGNOSIS — R059 Cough, unspecified: Secondary | ICD-10-CM

## 2016-12-09 MED ORDER — FITTED BRIEFS LARGE MISC: 1.0000 | Freq: Once | 30 refills | Status: AC

## 2016-12-09 NOTE — Telephone Encounter (Signed)
CMA call regarding medication prescription is ready to pick   Patient Verify DOB  Patient was aware and understood

## 2016-12-09 NOTE — Telephone Encounter (Signed)
Pt came to the office to drop up the Application for Disability Parking Placard, Pt was inform that form will give to the pcp and will be put it on the inbox, and also was advice that the nurse will call her when is done

## 2016-12-09 NOTE — Telephone Encounter (Signed)
[facility] called again regarding the note from 11/24/16 for the OT, PT, and speech therapy. Caller requests a call to advise when referral is placed.

## 2016-12-09 NOTE — Telephone Encounter (Signed)
Called and spoke with representative from [facility] regarding paperwork for DME equipment. Was told that statement of medical necessity form was already received. Most recent office visit note was requested by fax.

## 2016-12-10 ENCOUNTER — Other Ambulatory Visit: Payer: Self-pay | Admitting: Family Medicine

## 2016-12-11 ENCOUNTER — Telehealth: Payer: Self-pay | Admitting: Family Medicine

## 2016-12-11 ENCOUNTER — Telehealth: Payer: Self-pay | Admitting: Neurology

## 2016-12-11 NOTE — Telephone Encounter (Signed)
Please advise 

## 2016-12-11 NOTE — Telephone Encounter (Signed)
Pt calling to request a refill of her nicotine patches, states that her others were thrown away. Requests CMA to call and advise when script is written. Thank you.

## 2016-12-11 NOTE — Telephone Encounter (Signed)
Patient instructed to call PCP for this refill.

## 2016-12-11 NOTE — Telephone Encounter (Signed)
PT called and wanted to know if Dr Tomi Likens can call in her nicotine patches

## 2016-12-11 NOTE — Telephone Encounter (Signed)
This should be addressed to her PCP

## 2016-12-12 ENCOUNTER — Other Ambulatory Visit: Payer: Self-pay | Admitting: Family Medicine

## 2016-12-12 ENCOUNTER — Telehealth: Payer: Self-pay

## 2016-12-12 DIAGNOSIS — F17210 Nicotine dependence, cigarettes, uncomplicated: Secondary | ICD-10-CM

## 2016-12-12 DIAGNOSIS — G8194 Hemiplegia, unspecified affecting left nondominant side: Secondary | ICD-10-CM

## 2016-12-12 DIAGNOSIS — Z8673 Personal history of transient ischemic attack (TIA), and cerebral infarction without residual deficits: Secondary | ICD-10-CM

## 2016-12-12 MED ORDER — NICOTINE 14 MG/24HR TD PT24: 14.0000 mg | MEDICATED_PATCH | Freq: Every day | TRANSDERMAL | 1 refills | Status: DC

## 2016-12-12 NOTE — Telephone Encounter (Signed)
MA call regarding paperwork is ready to pickup at front desk & her prescription is ready   Patient did not answer but left a detailed message & if have any questions just to call back

## 2016-12-12 NOTE — Telephone Encounter (Signed)
Pt calling to request a refill of her nicotine patches, states that her others were thrown away. Requests CMA to call and advise when script is written. Thank you.

## 2016-12-12 NOTE — Telephone Encounter (Signed)
Prescription available for pick up 

## 2016-12-12 NOTE — Telephone Encounter (Signed)
[facility] message left with representative over the phone. Regarding referral being clarified and re-placed. Referral initially sent on 10/2616.

## 2016-12-15 MED FILL — NICODERM CQ 14 MG/24HR PATC: 14 | 28 days supply | Qty: 28 | Fill #0

## 2016-12-23 ENCOUNTER — Telehealth: Payer: Self-pay | Admitting: Family Medicine

## 2016-12-23 ENCOUNTER — Other Ambulatory Visit: Payer: Self-pay | Admitting: Family Medicine

## 2016-12-23 DIAGNOSIS — Z8673 Personal history of transient ischemic attack (TIA), and cerebral infarction without residual deficits: Secondary | ICD-10-CM

## 2016-12-23 MED FILL — CLOPIDOGREL 75 MG TABLET: 75 | 30 days supply | Qty: 30 | Fill #3

## 2016-12-23 MED FILL — VENLAFAXINE HCL ER 150 MG C: 150 | 30 days supply | Qty: 30 | Fill #1

## 2016-12-23 MED FILL — ATORVASTATIN 80 MG TABLET: 80 | 30 days supply | Qty: 30 | Fill #1

## 2016-12-23 NOTE — Telephone Encounter (Signed)
Pt came to the office to request a referral for a Mammogram, please call pt when is auth or denied, please follow up

## 2016-12-24 NOTE — Telephone Encounter (Signed)
Will route to PCP 

## 2016-12-25 ENCOUNTER — Other Ambulatory Visit: Payer: Self-pay | Admitting: Family Medicine

## 2016-12-25 DIAGNOSIS — Z1239 Encounter for other screening for malignant neoplasm of breast: Secondary | ICD-10-CM

## 2016-12-25 NOTE — Telephone Encounter (Signed)
Spoke with patient, patient stated that she does not have any issues with her breast her nurse just ask her when was her last MM and she stated that its been a while so her nurse reccommended to do one.

## 2016-12-25 NOTE — Telephone Encounter (Signed)
CMA call regarding MM referral requesting   Patient stated that she dont have any issues   CMA will let her pcp know about it   Patient was aware and understood

## 2016-12-25 NOTE — Telephone Encounter (Signed)
Referral placed.

## 2016-12-25 NOTE — Telephone Encounter (Signed)
Please ask if she has a breast compliant. If she has a breast complaint ( lumps, denting, dimpling, nipple discharge) she will need to be evaluated in office. Otherwise for routine MM screen referral will be placed.

## 2016-12-26 ENCOUNTER — Other Ambulatory Visit: Payer: Self-pay | Admitting: Family Medicine

## 2017-01-05 ENCOUNTER — Other Ambulatory Visit: Payer: Self-pay | Admitting: Family Medicine

## 2017-01-05 DIAGNOSIS — I10 Essential (primary) hypertension: Secondary | ICD-10-CM

## 2017-01-05 MED FILL — SPIRONOLACTONE 25 MG TABLET: 25 | 30 days supply | Qty: 30 | Fill #1

## 2017-01-05 MED FILL — GABAPENTIN 300 MG CAPSULE: 300 | 30 days supply | Qty: 60 | Fill #1

## 2017-01-05 MED FILL — LISINOPRIL 20 MG TAB: 20 | 30 days supply | Qty: 60 | Fill #1

## 2017-01-06 MED FILL — METOPROLOL TARTRATE 50 MG T: 50 | 30 days supply | Qty: 60 | Fill #0

## 2017-01-09 ENCOUNTER — Ambulatory Visit: Payer: Medicaid Other | Attending: Family Medicine | Admitting: Physical Therapy

## 2017-01-09 ENCOUNTER — Ambulatory Visit: Payer: Medicaid Other | Admitting: Occupational Therapy

## 2017-01-09 DIAGNOSIS — M6281 Muscle weakness (generalized): Secondary | ICD-10-CM | POA: Insufficient documentation

## 2017-01-09 DIAGNOSIS — I69359 Hemiplegia and hemiparesis following cerebral infarction affecting unspecified side: Secondary | ICD-10-CM | POA: Diagnosis present

## 2017-01-09 DIAGNOSIS — R269 Unspecified abnormalities of gait and mobility: Secondary | ICD-10-CM | POA: Insufficient documentation

## 2017-01-10 NOTE — Therapy (Signed)
[facility] [address] [place], [place], [address] Phone: [phone]   Fax:  [phone]  Physical Therapy Evaluation  Patient Details  Name: Brandy Roberts MRN: [id number] Date of Birth: [birth year] Referring Provider: Fredia Beets MD  Encounter Date: 01/09/2017      PT End of Session - 01/10/17 0953    Visit Number 1   Number of Visits 4  eval plus 3   Date for PT Re-Evaluation --  End of third treatment visit   Authorization Type MCD   Authorization Time Period TBD by Medicaid   PT Start Time 8127   PT Stop Time 1228  limited time due to transportation arrived early   PT Time Calculation (min) 35 min   Activity Tolerance Patient limited by fatigue   Behavior During Therapy Flat affect      Past Medical History:  Diagnosis Date  . Headache   . Hyperlipidemia   . Hypertension   . Stroke [facility])     Past Surgical History:  Procedure Laterality Date  . CESAREAN SECTION    . GANGLION CYST EXCISION    . TEE WITHOUT CARDIOVERSION N/A 10/09/2014   Procedure: TRANSESOPHAGEAL ECHOCARDIOGRAM (TEE);  Surgeon: Sanda Klein, MD;  Location: [facility] ENDOSCOPY;  Service: Cardiovascular;  Laterality: N/A;  . TONSILLECTOMY      There were no vitals filed for this visit.       Subjective Assessment - 01/09/17 1157    Subjective I had a new stroke. Left side now even weaker. Since this last stroke I've been so depressed and down. Did start a medicine for depression and it's helping.    Patient is accompained by: --  unaccompanied; arrived by [facility]   Pertinent History Admitted to hospital 5/25-5/27/18 for acute worsening of left-sided weakness and abnormal speech pattern; MRI brain negative; neurology consult noted imbellishment of weakness and suspected somatoform disorder. PMHx-HTN; bil internal capsule/basal ganglia CVAs with residual Left sided weakness   Limitations Walking   How long can you walk  comfortably? varies: household to limited community distances    Patient Stated Goals want to be able to walk independently (no device)   Currently in Pain? No/denies  does have left leg spasms at times            [facility] PT Assessment - 01/09/17 2130      Assessment   Medical Diagnosis left hemiparesis   Referring Provider Fredia Beets MD   Onset Date/Surgical Date 10/24/16   Hand Dominance Right   Prior Therapy HHPT     Precautions   Precautions Fall     Restrictions   Weight Bearing Restrictions No     Balance Screen   Has the patient fallen in the past 6 months No   Has the patient had a decrease in activity level because of a fear of falling?  No   Is the patient reluctant to leave their home because of a fear of falling?  No     Home Social worker Private residence   Living Arrangements Non-relatives/Friends  he works and gets home 12:30-1:00pm   Available Help at Discharge Personal care attendant;Friend(s)  aide 5 days/wk; helps with B&D, cleans, pill box   Type of [place] Access Level entry   Home Layout One level   [place] - 2 wheels;Walker - 4 wheels;Shower seat;Bedside commode     Prior Function   Level of Independence Independent  Cognition   Overall Cognitive Status No family/caregiver present to determine baseline cognitive functioning   Area of Impairment Attention;Memory   Current Attention Level Selective   Attention Comments difficult attending to instructions in busy gym environment   Memory Decreased short-term memory   Memory Comments ?also memory component to pt's repeated questions     Observation/Other Assessments   Observations Ambulates into exam room with rollator slightly too far ahead with stooped posture. Takes standing rest break after 35 ft   Focus on Therapeutic Outcomes (FOTO)  FS 54 with risk adjusted FS 45   Neuro Quality of Life  LE 40.5     Sensation   Light Touch Appears  Intact  pt reported total loss of sensation Lt side     Coordination   Gross Motor Movements are Fluid and Coordinated No   Fine Motor Movements are Fluid and Coordinated No  unable to do rapid alternating movement at ankle     Posture/Postural Control   Posture/Postural Control No significant limitations     Tone   Assessment Location Left Lower Extremity     ROM / Strength   AROM / PROM / Strength Strength     Strength   Overall Strength Within functional limits for tasks performed;Other (comment)  functionally WFL, however manual testing results below   Strength Assessment Site Hip;Knee;Ankle   Right/Left Hip Right;Left   Right Hip Flexion 5/5   Left Hip Flexion 3+/5   Right/Left Knee Right;Left   Right Knee Flexion 4/5   Right Knee Extension 5/5   Left Knee Flexion 3+/5   Left Knee Extension 2+/5   Right/Left Ankle Right;Left   Right Ankle Dorsiflexion 5/5   Left Ankle Dorsiflexion 2+/5     Transfers   Transfers Sit to Stand;Stand to Sit   Sit to Stand 6: Modified independent (Device/Increase time);With upper extremity assist  attempted no UEs and could not stand   Five time sit to stand comments  95 sec  using bil UEs on armrests   Stand to Sit 6: Modified independent (Device/Increase time);With upper extremity assist   Comments transfers to/from chair and seat on rollator     Ambulation/Gait   Ambulation/Gait Yes   Ambulation/Gait Assistance 4: Min guard;6: Modified independent (Device/Increase time)   Ambulation Distance (Feet) 80 Feet  25, 80   Assistive device Rollator   Gait Pattern Step-to pattern;Step-through pattern;Decreased step length - right;Decreased step length - left;Left foot flat;Left flexed knee in stance;Trunk flexed;Poor foot clearance - left   Ambulation Surface Level;Indoor   Gait velocity 10 ft/ 26 sec= 0.38 ft/sec    Gait Comments left knee remains in slight flexion, occasional partial buckle with pt independently maintaining balance;  when pt's phone began ringing and she had to answer call re: her transportation, she stood with lt knee fully extended and weight-shifted onto her LLE with no knee instability noted      LLE Tone   LLE Tone Within Functional Limits            Objective measurements completed on examination: See above findings.                  PT Education - 01/10/17 [phone]    Education provided Yes   Education Details resume HHPT HEP (states she hasn't been doing it because "I'm lazy"; frank discussion re: role of PT and if she's not going to do the exercises prescribed we're both wasting our time. States she really wants to  walk without a walker and will do better.    Person(s) Educated Patient   Methods Explanation   Comprehension Verbalized understanding             PT Long Term Goals - 01/10/17 1026      PT LONG TERM GOAL #1   Title Patient will demonstrate ambulating modified independent with least restrictive assistive device x 100 ft with no standing rest period needed.    Baseline 01/09/17 Required standing rest after 35 feet with max distance 80 ft.    Time 3   Period Weeks   Status New   Target Date --  await MCD approval and dates     PT LONG TERM GOAL #2   Title Patient will improve gait velocity to >= 0.75 ft/sec to demonstrate progress towards more functional gait velocity and lesser fall risk (less than 1.8 ft/sec indicative of increased fall risk)   Baseline 01/09/17 0.38 ft/sec    Time 3   Period Weeks   Status New   Target Date --  await MCD approval and dates     PT LONG TERM GOAL #3   Title Patient will perform 5 times sit to stand (with use of UEs and armrests as on eval) in <=80 sec to indicate improved LE strength and overall balance.   Baseline 01/09/17  95 sec with use of UEs and armrests   Time 3   Period Weeks   Status New   Target Date --  await MCD approval and dates                Plan - 01/10/17 0956    Clinical Impression  Statement Admitted to hospital 5/25-5/27/18 for acute worsening of left-sided weakness and abnormal speech pattern; MRI brain negative for acute infarct and demonstrated remote bil CVAs (I63 Cerebral Infarction); neurology consult noted imbellishment of weakness and suspected somatoform disorder. Patient has completed HHPT and referred to outpatient PT for left-sided weakness (G98.8 Other disorder of nervous system). During evaluation, pt noted to have inconsistent strength in LLE between manual muscle testing and functional use during gait. Anticipate patient can make good progress towards independent gait with standard course of PT, if this is due to somatoform disorder.    History and Personal Factors relevant to plan of care: PMHx-bil internal capsule/basal ganglia CVA with residual left hemiparesis, HTN, depression  Personal factors: coping styles; cognitive status; motivation   Clinical Presentation Stable   Clinical Presentation due to: Left-sided weakness has been improving with HHPT   Clinical Decision Making Low   Rehab Potential Good   Clinical Impairments Affecting Rehab Potential depression; cognition--attention and/or memory deficits   PT Frequency 1x / week   PT Duration 3 weeks  discussed possible advantage of biweekly over 6 weeks; pt requested 3 week duration   PT Treatment/Interventions ADLs/Self Care Home Management;DME Instruction;Gait training;Stair training;Functional mobility training;Therapeutic activities;Therapeutic exercise;Balance training;Neuromuscular re-education;Cognitive remediation;Patient/family education;Orthotic Fit/Training   PT Next Visit Plan Make sure pt knows what MCD approved and timeframe; cancel 3 of 6 appts originally made (if front staff has not already); pre-gait activities/dynamic balance with UEs involved in task to reinforce she has "untapped strength" in her LLE and then progress to gait with rollator vs cane; review HHPTs HEP (if pt brought it), if she  did not, give brief HEP   Consulted and Agree with Plan of Care Patient      Patient will benefit from skilled therapeutic intervention in order to improve the  following deficits and impairments:  Abnormal gait, Decreased activity tolerance, Decreased balance, Decreased cognition, Decreased coordination, Decreased endurance, Decreased mobility, Decreased strength, Impaired perceived functional ability  Visit Diagnosis: Abnormality of gait - Plan: PT plan of care cert/re-cert  Hemiparesis affecting nondominant side as late effect of cerebrovascular accident [facility]) - Plan: PT plan of care cert/re-cert  Muscle weakness (generalized) - Plan: PT plan of care cert/re-cert     Problem List Patient Active Problem List   Diagnosis Date Noted  . CVA (cerebral vascular accident) ([place]) 10/25/2016  . Post-menopausal bleeding 09/24/2016  . OSA (obstructive sleep apnea) 09/03/2015  . Heart valve vegetation 09/03/2015  . Snoring 05/02/2015  . Essential hypertension 05/02/2015  . Tobacco use disorder 05/02/2015  . Headache 05/01/2015  . Dysphagia, post-stroke 02/20/2015  . Former smoker 01/24/2015  . Alteration of sensations, post-stroke 12/22/2014  . Cognitive deficit, post-stroke 11/23/2014  . Left hemiparesis ([place]) 10/23/2014  . Cardiomyopathy due to hypertension ([place]) 10/23/2014  . Chronic ischemic vertebrobasilar artery thalamic stroke   . Aortic valve vegetation   . Hypertensive heart disease   . Overweight (BMI 25.0-29.9)   . H/O noncompliance with medical treatment, presenting hazards to health   . Aortic regurgitation   . Cerebral infarction due to thrombosis of right middle cerebral artery ([place])   . Hyperlipidemia   . Embolic stroke involving right middle cerebral artery ([place]) 10/06/2014    Rexanne Mano, PT 01/10/2017, 10:41 AM  [facility] [address] [place], [place], [address] Phone: [phone]   Fax:   [phone]  Name: Brandy Roberts MRN: [id number] Date of Birth: 05-30-1971

## 2017-01-12 ENCOUNTER — Emergency Department (HOSPITAL_COMMUNITY): Payer: Medicaid Other

## 2017-01-12 ENCOUNTER — Encounter (HOSPITAL_COMMUNITY): Payer: Self-pay | Admitting: Emergency Medicine

## 2017-01-12 ENCOUNTER — Ambulatory Visit: Payer: Medicaid Other | Attending: Family Medicine | Admitting: Pharmacist

## 2017-01-12 ENCOUNTER — Ambulatory Visit: Payer: Medicaid Other | Admitting: Family Medicine

## 2017-01-12 ENCOUNTER — Emergency Department (HOSPITAL_COMMUNITY)
Admission: EM | Admit: 2017-01-12 | Discharge: 2017-01-12 | Disposition: A | Discharge status: Home / Self Care | Payer: Medicaid Other | Attending: Emergency Medicine | Admitting: Emergency Medicine

## 2017-01-12 VITALS — BP 138/88 | HR 99

## 2017-01-12 DIAGNOSIS — Z87891 Personal history of nicotine dependence: Secondary | ICD-10-CM | POA: Diagnosis not present

## 2017-01-12 DIAGNOSIS — R51 Headache: Secondary | ICD-10-CM | POA: Diagnosis present

## 2017-01-12 DIAGNOSIS — Z7902 Long term (current) use of antithrombotics/antiplatelets: Secondary | ICD-10-CM | POA: Insufficient documentation

## 2017-01-12 DIAGNOSIS — Z79899 Other long term (current) drug therapy: Secondary | ICD-10-CM | POA: Insufficient documentation

## 2017-01-12 DIAGNOSIS — I119 Hypertensive heart disease without heart failure: Secondary | ICD-10-CM | POA: Diagnosis not present

## 2017-01-12 DIAGNOSIS — Z0000 Encounter for general adult medical examination without abnormal findings: Secondary | ICD-10-CM

## 2017-01-12 DIAGNOSIS — R519 Headache, unspecified: Secondary | ICD-10-CM

## 2017-01-12 LAB — I-STAT CHEM 8, ED
BUN: 21 mg/dL — AB (ref 6–20)
CALCIUM ION: 1.15 mmol/L (ref 1.15–1.40)
CHLORIDE: 107 mmol/L (ref 101–111)
CREATININE: 0.9 mg/dL (ref 0.44–1.00)
Glucose, Bld: 98 mg/dL (ref 65–99)
HCT: 36 % (ref 36.0–46.0)
Hemoglobin: 12.2 g/dL (ref 12.0–15.0)
Potassium: 4 mmol/L (ref 3.5–5.1)
Sodium: 143 mmol/L (ref 135–145)
TCO2: 26 mmol/L (ref 0–100)

## 2017-01-12 MED ORDER — KETOROLAC TROMETHAMINE 15 MG/ML IJ SOLN
15.0000 mg | Freq: Once | INTRAMUSCULAR | Status: AC
  Administered 2017-01-12: 15 mg via INTRAVENOUS
  Filled 2017-01-12: qty 1

## 2017-01-12 MED ORDER — DIPHENHYDRAMINE HCL 50 MG/ML IJ SOLN
25.0000 mg | Freq: Once | INTRAMUSCULAR | Status: AC
  Administered 2017-01-12: 25 mg via INTRAVENOUS
  Filled 2017-01-12: qty 1

## 2017-01-12 MED ORDER — SODIUM CHLORIDE 0.9 % IV BOLUS (SEPSIS)
1000.0000 mL | Freq: Once | INTRAVENOUS | Status: AC
  Administered 2017-01-12: 1000 mL via INTRAVENOUS

## 2017-01-12 MED ORDER — METOCLOPRAMIDE HCL 5 MG/ML IJ SOLN
10.0000 mg | Freq: Once | INTRAMUSCULAR | Status: AC
  Administered 2017-01-12: 10 mg via INTRAVENOUS
  Filled 2017-01-12: qty 2

## 2017-01-12 MED ORDER — DEXAMETHASONE SODIUM PHOSPHATE 10 MG/ML IJ SOLN
10.0000 mg | Freq: Once | INTRAMUSCULAR | Status: AC
  Administered 2017-01-12: 10 mg via INTRAVENOUS
  Filled 2017-01-12: qty 1

## 2017-01-12 NOTE — Discharge Instructions (Signed)
You were treated for headache, possibly migraine today. Symptoms were not controlled with Reglan, Benadryl, and Decadron (a steroid), and thenToradol (an NSAID). We would like you to visit your PCP in follow up to this visit to help with chronic control of these headaches. Please come back to the ED for recurrent severe headache, changes in vision, blurry vision, worsening weakness, change in your ability to walk, fever/chills, or neck stiffness. Thank you for letting us participate in your care today.

## 2017-01-12 NOTE — ED Triage Notes (Signed)
Pt presents to ED for assessment of HA starting yesterday with sensitivity to light, dizziness and nausea.  Pt has hx of multiple strokes, with most recent in May with remaining left sided deficits.  No new deficits noted by EMS.  BP 186/106.

## 2017-01-12 NOTE — ED Notes (Signed)
Pt ambulated in hallway to bathroom with walker

## 2017-01-12 NOTE — ED Provider Notes (Signed)
[place] DEPT Provider Note   CSN: [id number] Arrival date & time: 01/12/17  1501     History   Chief Complaint Chief Complaint  Patient presents with  . Headache    HPI Brandy Roberts is a 46 y.o. female with a past history of CVA (10/2014) with residual left-sided hemiparesis, HTN, HLD, and migraine headache presenting by EMS with a one-day history of global headache. Pt provided all history. Pt reports headache started around 4pm on 8/12 and resolved spontaneously, then began again around 10am today after taking her medications and has been constant since. Pt visited her PCP this afternoon and with blood pressure elevated to systolic 725D was advised to present to the ED. Pt reports headache is an 8/10 and is similar in quality to several headaches received since her CVA. Pt has not tried anything to relieve symptoms. Pt reports associated diplopia at time of headache onset, now resolved, lightheadedness, generalized weakness, and loss of appetite but no N/V or phonosensitivity, neck stiffness, or new focal deficits. Pt also reports taking a longer than usual walk yesterday with minimal fluid intake since. Pt has been ambulating per baseline.  HPI  Past Medical History:  Diagnosis Date  . Headache   . Hyperlipidemia   . Hypertension   . Stroke [facility])     Patient Active Problem List   Diagnosis Date Noted  . CVA (cerebral vascular accident) ([place]) 10/25/2016  . Post-menopausal bleeding 09/24/2016  . OSA (obstructive sleep apnea) 09/03/2015  . Heart valve vegetation 09/03/2015  . Snoring 05/02/2015  . Essential hypertension 05/02/2015  . Tobacco use disorder 05/02/2015  . Headache 05/01/2015  . Dysphagia, post-stroke 02/20/2015  . Former smoker 01/24/2015  . Alteration of sensations, post-stroke 12/22/2014  . Cognitive deficit, post-stroke 11/23/2014  . Left hemiparesis ([place]) 10/23/2014  . Cardiomyopathy due to hypertension ([place]) 10/23/2014  . Chronic ischemic  vertebrobasilar artery thalamic stroke   . Aortic valve vegetation   . Hypertensive heart disease   . Overweight (BMI 25.0-29.9)   . H/O noncompliance with medical treatment, presenting hazards to health   . Aortic regurgitation   . Cerebral infarction due to thrombosis of right middle cerebral artery ([place])   . Hyperlipidemia   . Embolic stroke involving right middle cerebral artery ([place]) 10/06/2014    Past Surgical History:  Procedure Laterality Date  . CESAREAN SECTION    . GANGLION CYST EXCISION    . TEE WITHOUT CARDIOVERSION N/A 10/09/2014   Procedure: TRANSESOPHAGEAL ECHOCARDIOGRAM (TEE);  Surgeon: Sanda Klein, MD;  Location: [facility] ENDOSCOPY;  Service: Cardiovascular;  Laterality: N/A;  . TONSILLECTOMY      OB History    No data available       Home Medications    Prior to Admission medications   Medication Sig Start Date End Date Taking? Authorizing Provider  amLODipine (NORVASC) 10 MG tablet Take 0.5 tablets (5 mg total) by mouth daily. 03/31/16  Yes Kirichenko, Tatyana, PA-C  atorvastatin (LIPITOR) 80 MG tablet Take 1 tablet (80 mg total) by mouth daily at 6 PM. 11/10/16  Yes Hairston, Mandesia R, FNP  clopidogrel (PLAVIX) 75 MG tablet Take 1 tablet (75 mg total) by mouth daily. 09/02/16  Yes Hairston, Mandesia R, FNP  fluticasone (FLONASE) 50 MCG/ACT nasal spray Place 2 sprays into both nostrils daily. Patient taking differently: Place 2 sprays into both nostrils as needed for allergies or rhinitis.  10/27/16  Yes Johnson, Clanford L, MD  gabapentin (NEURONTIN) 300 MG capsule Take 1 capsule (300 mg  total) by mouth 2 (two) times daily. 11/14/16  Yes Hairston, Mandesia R, FNP  hydrALAZINE (APRESOLINE) 50 MG tablet TAKE 1 TABLET BY MOUTH 3 TIMES DAILY WITH MEALS. Patient taking differently: TAKE 50MG  BY MOUTH 3 TIMES DAILY WITH MEALS. 12/08/16  Yes Hairston, Mandesia R, FNP  lisinopril (PRINIVIL,ZESTRIL) 20 MG tablet TAKE ONE TABLET BY MOUTH TWICE DAILY Patient taking differently:  TAKE 20MG  BY MOUTH TWICE DAILY 12/08/16  Yes Hairston, Mandesia R, FNP  metoprolol tartrate (LOPRESSOR) 50 MG tablet TAKE ONE TABLET BY MOUTH TWICE DAILY Patient taking differently: TAKE 50MG  BY MOUTH TWICE DAILY 01/05/17  Yes Hairston, Mandesia R, FNP  nicotine (NICODERM CQ) 14 mg/24hr patch Place 1 patch (14 mg total) onto the skin daily. 12/12/16  Yes Hairston, Toy Baker R, FNP  spironolactone (ALDACTONE) 25 MG tablet TAKE 1 TABLET BY MOUTH DAILY Patient taking differently: TAKE 25MG  BY MOUTH DAILY 12/08/16  Yes Hairston, Mandesia R, FNP  traMADol (ULTRAM) 50 MG tablet Take 1 tablet (50 mg total) by mouth every 8 (eight) hours as needed for severe pain. 10/31/16  Yes [name], FNP  venlafaxine XR (EFFEXOR-XR) 150 MG 24 hr capsule Take 1 capsule (150 mg total) by mouth daily with breakfast. 11/17/16  Yes Jaffe, Adam R, DO  ondansetron (ZOFRAN) 4 MG tablet Take 1 tablet (4 mg total) by mouth every 8 (eight) hours as needed for nausea or vomiting. 11/14/16   [name], FNP    Family History Family History  Problem Relation Age of Onset  . Cancer Mother   . Hypertension Mother   . Hypertension Sister     Social History Social History  Substance Use Topics  . Smoking status: Former Smoker    Packs/day: 0.25  . Smokeless tobacco: Never Used  . Alcohol use No     Allergies   Tylenol [acetaminophen]   Review of Systems Review of Systems  Constitutional: Positive for appetite change. Negative for chills and fever.  HENT: Negative for ear pain, sore throat and trouble swallowing.   Eyes: Positive for photophobia and visual disturbance. Negative for pain.       Reports diplopia at time of HA onset   Respiratory: Negative for cough, shortness of breath and wheezing.   Cardiovascular: Positive for palpitations. Negative for chest pain and leg swelling.  Gastrointestinal: Negative for abdominal pain, nausea and vomiting.  Genitourinary: Negative for dysuria and hematuria.    Musculoskeletal: Negative for arthralgias and back pain.  Skin: Negative for color change and rash.  Neurological: Positive for weakness, light-headedness and headaches. Negative for seizures, syncope and speech difficulty.  All other systems reviewed and are negative.    Physical Exam Updated Vital Signs BP 135/68   Pulse 69   Temp 98.8 F (37.1 C) (Oral)   Resp (!) 21   SpO2 99%   Physical Exam  Constitutional: She appears well-developed and well-nourished. No distress.  HENT:  Head: Normocephalic and atraumatic.  Eyes: Pupils are equal, round, and reactive to light. Conjunctivae are normal.  Right-beating horizontal nystagmus present bilaterally.  Neck: Normal range of motion. Neck supple.  Cardiovascular: Normal rate and regular rhythm.   No murmur heard. Diastolic murmur present.  Pulmonary/Chest: Effort normal and breath sounds normal. No respiratory distress.  Musculoskeletal: She exhibits no edema.  Neurological: She is alert. She displays normal reflexes.  PERRL. Right-beating nystagmus b/l. Left-sided anesthesia in CN V distribution. All other cranial nerves grossly intact. RUE 4/5, LUE 3/5, RLE 4/5, LLE 3/5. Finger to  nose intact on right, ataxic per baseline on left. Heel to shin intact on right. Unable to perform on left.   Skin: Skin is warm and dry.  Psychiatric: She has a normal mood and affect.  Nursing note and vitals reviewed.    ED Treatments / Results  Labs (all labs ordered are listed, but only abnormal results are displayed) Labs Reviewed  I-STAT CHEM 8, ED - Abnormal; Notable for the following:       Result Value   BUN 21 (*)    All other components within normal limits    EKG  EKG Interpretation None       Radiology Ct Head Wo Contrast  Result Date: 01/12/2017 CLINICAL DATA:  Headache. Sensitivity to light. Prior strokes with left-sided deficits. EXAM: CT HEAD WITHOUT CONTRAST TECHNIQUE: Contiguous axial images were obtained from  the base of the skull through the vertex without intravenous contrast. COMPARISON:  10/25/2016 brain MR.  Head CT 10/24/2016. FINDINGS: Brain: Mild cerebral atrophy for age. Left basal ganglia and anterior limb internal capsule lacunar infarct. There is a separate more lateral and posterior probable left basal ganglia lacunar infarct which is similar. No mass lesion, hemorrhage, hydrocephalus, acute infarct, intra-axial, or extra-axial fluid collection. Vascular: No hyperdense vessel or unexpected calcification. Skull: Normal Sinuses/Orbits: Normal imaged portions of the orbits and globes. Clear paranasal sinuses and mastoid air cells. Other: None. IMPRESSION: 1.  No acute intracranial abnormality. 2. Age advanced cerebral atrophy with remote left basal ganglia infarct or infarcts. Electronically Signed   By: Abigail Miyamoto M.D.   On: 01/12/2017 18:37    Procedures Procedures (including critical care time)  Medications Ordered in ED Medications  metoCLOPramide (REGLAN) injection 10 mg (10 mg Intravenous Given 01/12/17 1701)  diphenhydrAMINE (BENADRYL) injection 25 mg (25 mg Intravenous Given 01/12/17 1658)  dexamethasone (DECADRON) injection 10 mg (10 mg Intravenous Given 01/12/17 1659)  sodium chloride 0.9 % bolus 1,000 mL (0 mLs Intravenous Stopped 01/12/17 1728)  ketorolac (TORADOL) 15 MG/ML injection 15 mg (15 mg Intravenous Given 01/12/17 2022)     Initial Impression / Assessment and Plan / ED Course  I have reviewed the triage vital signs and the nursing notes.  Pertinent labs & imaging results that were available during my care of the patient were reviewed by me and considered in my medical decision making (see chart for details).   6:20pm. Patient reports pain unchanged after migraine cocktail and fluid bolus. Waiting on CT report to determine if patient can receive Toradol.  7:30pm. Pt able to ambulate with home walker in department per baseline. No ataxia. Slight hemiparetic gait per  baseline. Headache 7/10.  Final Clinical Impressions(s) / ED Diagnoses   Final diagnoses:  Nonintractable headache, unspecified chronicity pattern, unspecified headache type   MDM  46 y.o. female with a past history of CVA (10/2014) with residual left-sided hemiparesis, HTN, HLD, and migraine headache presenting by EMS with a one-day history of global headache. Headache is of similar quality of prior acute headaches since her CVA. Given PCP's concern and significantly elevated BP (185/106) this afternoon, patient was evaluated for ICH. DDx includes hemorrhagic stroke, CVA, migraine, complicated migraine, meningitis, tension-type headache, dehydration. Negative CT within a sensitive time period for ICH suggestive of no ICH process. No focal neuro deficits changed from baseline. No meningeal symptoms suggestive of meningitis. Patient's HA did not respond to migraine cocktail of Reglan, Benadryl, and Decadron but responded with complete resolution of headache to Toradol.   New Prescriptions Discharge  Medication List as of 01/12/2017  9:47 PM           Albesa Seen, PA-C 01/12/17 [place], [place], DO 01/12/17 2317

## 2017-01-12 NOTE — Progress Notes (Signed)
    Patient resents to the clinic for hypertension evaluation as a walk-in.   Current BP Medications include:  Amlodipine 10 mg daily, hydralazine 50 mg TID, metoprolol tartrate 50 mg BID, spironolactone 25 mg daily.  BP Readings from Last 3 Encounters:  01/12/17 138/88  11/23/16 111/64  11/17/16 118/60    Patient reports severe headache with left sided weakness (patient has hx of stroke) and dizziness. Blood pressure ok.  Reports that she has a hx of migraines but never this much dizziness.  Patient slow to respond but oriented x 3. Consulted Dr. Doreene Burke, EMS called and patient transported to ED.    [name]

## 2017-01-12 NOTE — ED Provider Notes (Signed)
4:45 PM Patient seen in conjunction with [facility] ([place]).  Patient on anticoagulation due to previous CVA, with baseline left-sided deficits. She has had intermittent headaches in the past. She presents with generalized headache, waxing and waning starting yesterday after a walk. Patient was able to sleep last night and symptoms were improved. She went to her primary care physician for blood pressure management today where she was found to have an elevated blood pressure. Between 2 and 3 PM her headache became more severe again prompting called EMS and transported to the hospital. No new deficits reported by patient. She is ambulatory with a walker at her baseline.  Plan: CT, treatment of HA.   9:27 PM During ED stay, patient had limited relief with migraine cocktail. Toradol was given and helped.   She has ambulated at her baseline. She continues to have no new focal weakness or other new deficits. BP improved from what it was at her PCP. She requests discharge.   BP (!) 155/74   Pulse 76   Temp 98.8 F (37.1 C) (Oral)   Resp 20   SpO2 98%   Symptoms consistent with her chronic HA syndrome. No evidence of bleeding on CT scan (HA worsened about 2pm).   Will d/c to home.    Carlisle Cater, PA-C 01/12/17 2130    Deno Etienne, DO 01/12/17 2317

## 2017-01-22 ENCOUNTER — Ambulatory Visit: Payer: Medicaid Other | Admitting: Physical Therapy

## 2017-01-22 ENCOUNTER — Encounter: Payer: Self-pay | Admitting: Physical Therapy

## 2017-01-22 DIAGNOSIS — R269 Unspecified abnormalities of gait and mobility: Secondary | ICD-10-CM

## 2017-01-22 DIAGNOSIS — M6281 Muscle weakness (generalized): Secondary | ICD-10-CM

## 2017-01-22 NOTE — Patient Instructions (Signed)
HIP: Flexion / KNEE: Extension, Straight Leg Raise    Raise leg, keeping knee straight. Perform slowly. 10 reps per set, 1 sets per day, 5 days per week.   Copyright  VHI. All rights reserved.   Strengthening: Hip Abduction (Side-Lying)    Tighten muscles on front of left thigh, then lift legup toward the ceiling, keeping knee locked.  Repeat 5-10 times per set. Do 1 sets per session. Do 1 sessions per day. 5 days per week.   http://orth.exer.us/622   Copyright  VHI. All rights reserved.           Bridging    Slowly raise buttocks from floor, keeping stomach tight. Count to 3 slowly and lower back down.  Repeat10 times per set. Do 1 sets per session. Do 1 sessions per day.  http://orth.exer.us/1096   Copyright  VHI. All rights reserved.   Functional Quadriceps: Sit to Stand    Sit on edge of chair, feet flat on floor. Stand upright, extending knees fully. Repeat __10__ times per set. Do ___1_ sets per session. Do __1__ sessions per day.  http://orth.exer.us/735   Copyright  VHI. All rights reserved.     Copyright  VHI. All rights reserved.  Knee High    Holding stable object, raise left knee to hip level, then lower knee. Repeat 10 times. Do 1 sessions per day.  http://gt2.exer.us/767   Copyright  VHI. All rights reserved.                       Ankle Plantar Flexion / Dorsiflexion, Standing    Stand while holding a stable object. Rise up on toes. Then rock back on heels. (you can lift one toe at a time)  Repeat 10 times per session. Do 1 sessions per day.  Copyright  VHI. All rights reserved.

## 2017-01-22 NOTE — Therapy (Signed)
[facility] [address] [place], [place], [address] Phone: [phone]   Fax:  [phone]  Physical Therapy Treatment  Patient Details  Name: Brandy Roberts MRN: [id number] Date of Birth: [birth year] Referring Provider: Fredia Beets MD  Encounter Date: 01/22/2017      PT End of Session - 01/22/17 1912    Visit Number 2   Number of Visits 4  eval plus 3   Date for PT Re-Evaluation 02/10/17  End of third treatment visit   Authorization Type MCD   Authorization Time Period TBD by Medicaid   Authorization - Visit Number 1   Authorization - Number of Visits 3   PT Start Time 0933   PT Stop Time 1017   PT Time Calculation (min) 44 min   Activity Tolerance Patient limited by fatigue   Behavior During Therapy Flat affect      Past Medical History:  Diagnosis Date  . Headache   . Hyperlipidemia   . Hypertension   . Stroke [facility])     Past Surgical History:  Procedure Laterality Date  . CESAREAN SECTION    . GANGLION CYST EXCISION    . TEE WITHOUT CARDIOVERSION N/A 10/09/2014   Procedure: TRANSESOPHAGEAL ECHOCARDIOGRAM (TEE);  Surgeon: Sanda Klein, MD;  Location: [facility] ENDOSCOPY;  Service: Cardiovascular;  Laterality: N/A;  . TONSILLECTOMY      There were no vitals filed for this visit.      Subjective Assessment - 01/22/17 0938    Subjective Lt side is still not as strong as before.    Patient is accompained by: --  unaccompanied; arrived by [facility]   Pertinent History Admitted to hospital 5/25-5/27/18 for acute worsening of left-sided weakness and abnormal speech pattern; MRI brain negative; neurology consult noted imbellishment of weakness and suspected somatoform disorder. PMHx-HTN; bil internal capsule/basal ganglia CVAs with residual Left sided weakness   Limitations Walking   How long can you walk comfortably? varies: household to limited community distances    Patient Stated Goals want to be  able to walk independently (no device)   Currently in Pain? No/denies                         [facility] Adult PT Treatment/Exercise - 01/22/17 0001      Bed Mobility   Bed Mobility Supine to Sit;Sit to Supine;Rolling Right   Rolling Right 6: Modified independent (Device/Increase time)   Supine to Sit 6: Modified independent (Device/Increase time)   Sit to Supine 6: Modified independent (Device/Increase time)     Transfers   Transfers Sit to Stand;Stand to Sit   Sit to Stand 5: Supervision   Sit to Stand Details (indicate cue type and reason) vc each time she transferred to remember sequence and safe hand placment   Stand to Sit 5: Supervision   Stand to Sit Details (indicate cue type and reason) Tactile cues for posture   Stand to Sit Details vc for safety   Number of Reps --  5     Ambulation/Gait   Ambulation/Gait Yes   Ambulation/Gait Assistance 5: Supervision   Ambulation/Gait Assistance Details occasional drag of Lt toe wihtout LOB    Ambulation Distance (Feet) 120 Feet  20 x2, 40   Assistive device Rollator   Gait Pattern Step-to pattern;Step-through pattern;Decreased step length - right;Decreased step length - left;Left foot flat;Left flexed knee in stance;Trunk flexed;Poor foot clearance - left   Ambulation Surface Level  Exercises   Exercises Knee/Hip     Knee/Hip Exercises: Aerobic   Nustep L2 x 6 min, UEs and LEs     Knee/Hip Exercises: Standing   Heel Raises Both;2 sets;5 reps;3 seconds   Heel Raises Limitations lifting heels <1"; lt knee in flexion, rt knee extended   Hip Flexion Stengthening;Left;1 set   Hip Flexion Limitations lifting foot 1-4" off the floor     Knee/Hip Exercises: Supine   Heel Slides AROM;AAROM;Left;2 sets;5 reps   Heel Slides Limitations inconsistent activation of hip flexor, when pushing against resistance into extension and resistance abruptly stopped, pt was able to hold leg in the air without assist   Bridges  Strengthening;Both;1 set;10 reps   Straight Leg Raises AAROM;Left;1 set;10 reps     Knee/Hip Exercises: Sidelying   Hip ABduction AROM;Left;1 set;10 reps  3 sec hold                PT Education - 01/22/17 1911    Education provided Yes   Education Details additions to HEP; MCD approval of 3 visits thru 9/11   Person(s) Educated Patient   Methods Explanation;Demonstration;Handout;Tactile cues;Verbal cues   Comprehension Returned demonstration;Need further instruction             PT Long Term Goals - 01/10/17 1026      PT LONG TERM GOAL #1   Title Patient will demonstrate ambulating modified independent with least restrictive assistive device x 100 ft with no standing rest period needed.    Baseline 01/09/17 Required standing rest after 35 feet with max distance 80 ft.    Time 3   Period Weeks   Status New   Target Date --  await MCD approval and dates     PT LONG TERM GOAL #2   Title Patient will improve gait velocity to >= 0.75 ft/sec to demonstrate progress towards more functional gait velocity and lesser fall risk (less than 1.8 ft/sec indicative of increased fall risk)   Baseline 01/09/17 0.38 ft/sec    Time 3   Period Weeks   Status New   Target Date --  await MCD approval and dates     PT LONG TERM GOAL #3   Title Patient will perform 5 times sit to stand (with use of UEs and armrests as on eval) in <=80 sec to indicate improved LE strength and overall balance.   Baseline 01/09/17  95 sec with use of UEs and armrests   Time 3   Period Weeks   Status New   Target Date --  await MCD approval and dates               Plan - 01/22/17 1914    Clinical Impression Statement Session focused on establishing a HEP to address pt's reported  Lt sided weakness. Noted inconsistencies in pt's strength throughout session. Will assess pt's understanding of HEP on next visit.    Rehab Potential Good   Clinical Impairments Affecting Rehab Potential depression;  cognition--attention and/or memory deficits   PT Frequency 1x / week   PT Duration 3 weeks  discussed possible advantage of biweekly over 6 weeks; pt requested 3 week duration   PT Treatment/Interventions ADLs/Self Care Home Management;DME Instruction;Gait training;Stair training;Functional mobility training;Therapeutic activities;Therapeutic exercise;Balance training;Neuromuscular re-education;Cognitive remediation;Patient/family education;Orthotic Fit/Training   PT Next Visit Plan pre-gait activities/dynamic balance with UEs involved in task to reinforce she has "untapped strength" in her LLE; review HEP from 8/23 and update as indicated   Consulted and  Agree with Plan of Care Patient      Patient will benefit from skilled therapeutic intervention in order to improve the following deficits and impairments:  Abnormal gait, Decreased activity tolerance, Decreased balance, Decreased cognition, Decreased coordination, Decreased endurance, Decreased mobility, Decreased strength, Impaired perceived functional ability  Visit Diagnosis: Abnormality of gait  Muscle weakness (generalized)     Problem List Patient Active Problem List   Diagnosis Date Noted  . CVA (cerebral vascular accident) ([place]) 10/25/2016  . Post-menopausal bleeding 09/24/2016  . OSA (obstructive sleep apnea) 09/03/2015  . Heart valve vegetation 09/03/2015  . Snoring 05/02/2015  . Essential hypertension 05/02/2015  . Tobacco use disorder 05/02/2015  . Headache 05/01/2015  . Dysphagia, post-stroke 02/20/2015  . Former smoker 01/24/2015  . Alteration of sensations, post-stroke 12/22/2014  . Cognitive deficit, post-stroke 11/23/2014  . Left hemiparesis ([place]) 10/23/2014  . Cardiomyopathy due to hypertension ([place]) 10/23/2014  . Chronic ischemic vertebrobasilar artery thalamic stroke   . Aortic valve vegetation   . Hypertensive heart disease   . Overweight (BMI 25.0-29.9)   . H/O noncompliance with medical treatment,  presenting hazards to health   . Aortic regurgitation   . Cerebral infarction due to thrombosis of right middle cerebral artery ([place])   . Hyperlipidemia   . Embolic stroke involving right middle cerebral artery ([place]) 10/06/2014    Rexanne Mano, PT 01/22/2017, 7:23 PM  [facility] [address] [place], [place], [address] Phone: [phone]   Fax:  [phone]  Name: Arrabella Westerman MRN: [id number] Date of Birth: 06-26-1970

## 2017-01-23 ENCOUNTER — Ambulatory Visit: Payer: Medicaid Other | Admitting: Rehabilitation

## 2017-01-26 ENCOUNTER — Encounter: Payer: Self-pay | Admitting: Physical Therapy

## 2017-01-26 ENCOUNTER — Ambulatory Visit: Payer: Medicaid Other | Admitting: Physical Therapy

## 2017-01-26 DIAGNOSIS — R269 Unspecified abnormalities of gait and mobility: Secondary | ICD-10-CM | POA: Diagnosis not present

## 2017-01-26 DIAGNOSIS — I69359 Hemiplegia and hemiparesis following cerebral infarction affecting unspecified side: Secondary | ICD-10-CM

## 2017-01-26 DIAGNOSIS — M6281 Muscle weakness (generalized): Secondary | ICD-10-CM

## 2017-01-26 NOTE — Patient Instructions (Addendum)
      With red band around your ankles as shown holding for support: -  bring one leg out to the side and then slowly back in. Repeat with other leg, alternating legs.  Keep body tall, do not lean when lifting leg out to side. 10 reps each leg, 1-2 times a day.      With band around both ankles holding for support: Slowly bring one leg back as shown in picture and then back to start position. Repeat with other leg, alternating legs. Do not lean forward.  10 reps each side, 1-2 times a day.    Strengthening: Hip Abductor - Resisted    Seated: With band looped around both legs above knees, push thighs apart while keeping knees together. Hold for 5 seconds. Repeat _10_ times per set. Do __1__ sets per session. Do _1-2_ sessions per day.  http://orth.exer.us/688   Copyright  VHI. All rights reserved.   Step-Up: Forward    Using outdoor steps or step stool/tall books stacked at counter top so to hold for support: Place left foot on step/stool/books: Step up forward to lift right foot up into air and then lower right foot down to floor slowly. Use left leg more than arms for the lifting.  Do _10__ times,  __1-2_ times per day.  http://ss.exer.us/175   Copyright  VHI. All rights reserved.

## 2017-01-26 NOTE — Therapy (Signed)
[facility] [address] [place], [place], [address] Phone: [phone]   Fax:  [phone]  Physical Therapy Treatment  Patient Details  Name: Brandy Roberts MRN: [id number] Date of Birth: 28-Jul-1970 Referring Provider: Fredia Beets MD  Encounter Date: 01/26/2017      PT End of Session - 01/26/17 1107    Visit Number 3   Number of Visits 4  eval plus 3   Date for PT Re-Evaluation 02/10/17  End of third treatment visit   Authorization Type MCD   Authorization Time Period TBD by Medicaid   Authorization - Visit Number 2   Authorization - Number of Visits 3   PT Start Time 1104   PT Stop Time 1150   PT Time Calculation (min) 46 min   Activity Tolerance Patient limited by fatigue;Patient tolerated treatment well   Behavior During Therapy Flat affect;WFL for tasks assessed/performed      Past Medical History:  Diagnosis Date  . Headache   . Hyperlipidemia   . Hypertension   . Stroke [facility])     Past Surgical History:  Procedure Laterality Date  . CESAREAN SECTION    . GANGLION CYST EXCISION    . TEE WITHOUT CARDIOVERSION N/A 10/09/2014   Procedure: TRANSESOPHAGEAL ECHOCARDIOGRAM (TEE);  Surgeon: Sanda Klein, MD;  Location: [facility] ENDOSCOPY;  Service: Cardiovascular;  Laterality: N/A;  . TONSILLECTOMY      There were no vitals filed for this visit.      Subjective Assessment - 01/26/17 1105    Subjective No new complaints. No falls to report. HEP is going okay "I just have a hard time lifting up my left leg".    Pertinent History Admitted to hospital 5/25-5/27/18 for acute worsening of left-sided weakness and abnormal speech pattern; MRI brain negative; neurology consult noted imbellishment of weakness and suspected somatoform disorder. PMHx-HTN; bil internal capsule/basal ganglia CVAs with residual Left sided weakness   Limitations Walking   How long can you walk comfortably? varies: household to  limited community distances    Patient Stated Goals want to be able to walk independently (no device)   Currently in Pain? No/denies   Pain Score 0-No pain      Treatment: Pt performed all exercises issued at previous session with minimal cues on correct form and technique.   Added the following to pt's HEP today:       With red band around your ankles as shown holding for support: -  bring one leg out to the side and then slowly back in. Repeat with other leg, alternating legs.  Keep body tall, do not lean when lifting leg out to side. 10 reps each leg, 1-2 times a day.      With band around both ankles holding for support: Slowly bring one leg back as shown in picture and then back to start position. Repeat with other leg, alternating legs. Do not lean forward.  10 reps each side, 1-2 times a day.    Strengthening: Hip Abductor - Resisted    Seated: With band looped around both legs above knees, push thighs apart while keeping knees together. Hold for 5 seconds. Repeat _10_ times per set. Do __1__ sets per session. Do _1-2_ sessions per day.  http://orth.exer.us/688   Copyright  VHI. All rights reserved.   Step-Up: Forward    Using outdoor steps or step stool/tall books stacked at counter top so to hold for support: Place left foot on step/stool/books: Step up forward  to lift right foot up into air and then lower right foot down to floor slowly. Use left leg more than arms for the lifting.  Do _10__ times,  __1-2_ times per day.  http://ss.exer.us/175   Copyright  VHI. All rights reserved.           PT Education - 01/26/17 1149    Education provided Yes   Education Details reviewed previous HEP and added new ex's today for LE strengthening; pt inquiring about a new AFO as this one does not fit in her new shoes. Advised pt this brace still workes for her and is new. Insurance may no cover the cost of a new one. Recommended she try to exhange the shoes  for a 1/2 size bigger/take brace with her to ensure it fits in shoes.                     Person(s) Educated Patient   Methods Explanation;Demonstration;Verbal cues;Handout   Comprehension Verbalized understanding;Returned demonstration;Verbal cues required;Need further instruction             PT Long Term Goals - 01/10/17 1026      PT LONG TERM GOAL #1   Title Patient will demonstrate ambulating modified independent with least restrictive assistive device x 100 ft with no standing rest period needed.    Baseline 01/09/17 Required standing rest after 35 feet with max distance 80 ft.    Time 3   Period Weeks   Status New   Target Date --  await MCD approval and dates     PT LONG TERM GOAL #2   Title Patient will improve gait velocity to >= 0.75 ft/sec to demonstrate progress towards more functional gait velocity and lesser fall risk (less than 1.8 ft/sec indicative of increased fall risk)   Baseline 01/09/17 0.38 ft/sec    Time 3   Period Weeks   Status New   Target Date --  await MCD approval and dates     PT LONG TERM GOAL #3   Title Patient will perform 5 times sit to stand (with use of UEs and armrests as on eval) in <=80 sec to indicate improved LE strength and overall balance.   Baseline 01/09/17  95 sec with use of UEs and armrests   Time 3   Period Weeks   Status New   Target Date --  await MCD approval and dates               Plan - 01/26/17 1107    Clinical Impression Statement Today's skilled session continued to address LE strengthening. Reviewed with pt performing all exercises issued last session. Provided cues on correct form and technique. Added new strengthening ex's today to HEP to continue to address LE strengthening. Pt is progressing and should benefit from continued PT to progress toward unmet goals.    Rehab Potential Good   Clinical Impairments Affecting Rehab Potential depression; cognition--attention and/or memory deficits   PT Frequency 1x  / week   PT Duration 3 weeks  discussed possible advantage of biweekly over 6 weeks; pt requested 3 week duration   PT Treatment/Interventions ADLs/Self Care Home Management;DME Instruction;Gait training;Stair training;Functional mobility training;Therapeutic activities;Therapeutic exercise;Balance training;Neuromuscular re-education;Cognitive remediation;Patient/family education;Orthotic Fit/Training   PT Next Visit Plan review briefly HEP, goals and finalize post therapy plan (? community fitness, free PT clinics at schools)   Consulted and Agree with Plan of Care Patient      Patient will benefit from  skilled therapeutic intervention in order to improve the following deficits and impairments:  Abnormal gait, Decreased activity tolerance, Decreased balance, Decreased cognition, Decreased coordination, Decreased endurance, Decreased mobility, Decreased strength, Impaired perceived functional ability  Visit Diagnosis: Muscle weakness (generalized)  Hemiparesis affecting nondominant side as late effect of cerebrovascular accident [facility])     Problem List Patient Active Problem List   Diagnosis Date Noted  . CVA (cerebral vascular accident) ([place]) 10/25/2016  . Post-menopausal bleeding 09/24/2016  . OSA (obstructive sleep apnea) 09/03/2015  . Heart valve vegetation 09/03/2015  . Snoring 05/02/2015  . Essential hypertension 05/02/2015  . Tobacco use disorder 05/02/2015  . Headache 05/01/2015  . Dysphagia, post-stroke 02/20/2015  . Former smoker 01/24/2015  . Alteration of sensations, post-stroke 12/22/2014  . Cognitive deficit, post-stroke 11/23/2014  . Left hemiparesis ([place]) 10/23/2014  . Cardiomyopathy due to hypertension ([place]) 10/23/2014  . Chronic ischemic vertebrobasilar artery thalamic stroke   . Aortic valve vegetation   . Hypertensive heart disease   . Overweight (BMI 25.0-29.9)   . H/O noncompliance with medical treatment, presenting hazards to health   . Aortic  regurgitation   . Cerebral infarction due to thrombosis of right middle cerebral artery ([place])   . Hyperlipidemia   . Embolic stroke involving right middle cerebral artery ([place]) 10/06/2014    Willow Ora, PTA, [facility] [address] [place], [place] [address] [phone] 01/26/17, 3:39 PM   Name: Brandy Roberts MRN: [id number] Date of Birth: [birth year]

## 2017-01-27 ENCOUNTER — Ambulatory Visit
Admission: RE | Admit: 2017-01-27 | Discharge: 2017-01-27 | Disposition: A | Discharge status: Home / Self Care | Payer: Medicaid Other | Source: Ambulatory Visit | Attending: Family Medicine | Admitting: Family Medicine

## 2017-01-27 ENCOUNTER — Other Ambulatory Visit: Payer: Self-pay | Admitting: Family Medicine

## 2017-01-27 DIAGNOSIS — N63 Unspecified lump in unspecified breast: Secondary | ICD-10-CM

## 2017-01-27 DIAGNOSIS — Z1239 Encounter for other screening for malignant neoplasm of breast: Secondary | ICD-10-CM

## 2017-01-27 MED FILL — hydrALAZINE HCL 50 MG TABS: 50 | 30 days supply | Qty: 90 | Fill #1 | Status: TO

## 2017-01-28 ENCOUNTER — Ambulatory Visit: Payer: Medicaid Other | Admitting: Physical Therapy

## 2017-02-03 ENCOUNTER — Ambulatory Visit: Payer: Medicaid Other | Attending: Family Medicine | Admitting: Physical Therapy

## 2017-02-03 DIAGNOSIS — I69359 Hemiplegia and hemiparesis following cerebral infarction affecting unspecified side: Secondary | ICD-10-CM | POA: Diagnosis present

## 2017-02-03 DIAGNOSIS — M6281 Muscle weakness (generalized): Secondary | ICD-10-CM | POA: Insufficient documentation

## 2017-02-03 DIAGNOSIS — R269 Unspecified abnormalities of gait and mobility: Secondary | ICD-10-CM | POA: Diagnosis present

## 2017-02-03 NOTE — Therapy (Signed)
[facility] [address] [place], [place], [address] Phone: [phone]   Fax:  [phone]  Physical Therapy Treatment and Discharge Summary  Patient Details  Name: Brandy Roberts MRN: [id number] Date of Birth: [birth year] Referring Provider: Fredia Beets MD  Encounter Date: 02/03/2017      PT End of Session - 02/03/17 2021    Visit Number 4   Number of Visits 4  eval plus 3   Date for PT Re-Evaluation 02/10/17  End of third treatment visit   Authorization Type MCD   Authorization Time Period TBD by Medicaid   Authorization - Visit Number 3   Authorization - Number of Visits 3   PT Start Time 0932   PT Stop Time 1011   PT Time Calculation (min) 39 min   Equipment Utilized During Treatment Gait belt   Activity Tolerance Patient tolerated treatment well   Behavior During Therapy [facility] for tasks assessed/performed      Past Medical History:  Diagnosis Date  . Headache   . Hyperlipidemia   . Hypertension   . Stroke [facility])     Past Surgical History:  Procedure Laterality Date  . CESAREAN SECTION    . GANGLION CYST EXCISION    . TEE WITHOUT CARDIOVERSION N/A 10/09/2014   Procedure: TRANSESOPHAGEAL ECHOCARDIOGRAM (TEE);  Surgeon: Sanda Klein, MD;  Location: [facility] ENDOSCOPY;  Service: Cardiovascular;  Laterality: N/A;  . TONSILLECTOMY      There were no vitals filed for this visit.      Subjective Assessment - 02/03/17 0938    Subjective No new complaints. I think I'm walking a little bit bettter. I can walk around the block.    Pertinent History Admitted to hospital 5/25-5/27/18 for acute worsening of left-sided weakness and abnormal speech pattern; MRI brain negative; neurology consult noted imbellishment of weakness and suspected somatoform disorder. PMHx-HTN; bil internal capsule/basal ganglia CVAs with residual Left sided weakness   Limitations Walking   How long can you walk comfortably? varies:  household to limited community distances    Patient Stated Goals want to be able to walk independently (no device)   Currently in Pain? No/denies                         [facility] Adult PT Treatment/Exercise - 02/03/17 0944      Transfers   Transfers Sit to Stand;Stand to Sit   Sit to Stand 6: Modified independent (Device/Increase time)   Five time sit to stand comments  24.03 sec   Stand to Sit 6: Modified independent (Device/Increase time)     Ambulation/Gait   Ambulation/Gait Yes   Ambulation/Gait Assistance 6: Modified independent (Device/Increase time);5: Supervision   Ambulation/Gait Assistance Details with both rollator (modified independent) and cane with large rubber quad tip; used cane outdoors including inclines, declines, grass, gravel, paved areas.    Ambulation Distance (Feet) 120 Feet  400   Assistive device Rollator;Straight cane   Gait Pattern Step-to pattern;Step-through pattern;Decreased step length - right;Decreased step length - left;Left foot flat;Trunk flexed;Poor foot clearance - left   Ambulation Surface Level;Unlevel;Indoor;Outdoor;Paved;Gravel;Grass   Gait velocity 20/9.57=2.09 ft/sec     Knee/Hip Exercises: Machines for Strengthening   Cybex Leg Press 50# bil LEs, 10 reps x 2 sets                PT Education - 02/03/17 2020    Education provided Yes   Education Details reviewed recent additions to  HEP for LE strengthening; need to continue her walking program and HEP if she wants to continue to improve   Person(s) Educated Patient   Methods Explanation;Demonstration   Comprehension Verbalized understanding;Returned demonstration             PT Long Term Goals - 02/03/17 2023      PT LONG TERM GOAL #1   Title Patient will demonstrate ambulating modified independent with least restrictive assistive device x 100 ft with no standing rest period needed.    Baseline 01/09/17 Required standing rest after 35 feet with max distance  80 ft. 02/03/17 120 ft, no rest   Time 3   Period Weeks   Status Achieved     PT LONG TERM GOAL #2   Title Patient will improve gait velocity to >= 0.75 ft/sec to demonstrate progress towards more functional gait velocity and lesser fall risk (less than 1.8 ft/sec indicative of increased fall risk)   Baseline 01/09/17 0.38 ft/sec ; 02/03/17 2.08 ft/sec   Time 3   Period Weeks   Status Achieved     PT LONG TERM GOAL #3   Title Patient will perform 5 times sit to stand (with use of UEs and armrests as on eval) in <=80 sec to indicate improved LE strength and overall balance.   Baseline 01/09/17  95 sec with use of UEs and armrests; 02/03/17  24.03 sec   Time 3   Period Weeks   Status Achieved               Plan - 02/03/17 2025    Clinical Impression Statement Final session today with pt meeting 3 of 3 LTGs (and far-surpassing 2 of 3 goals). Remainder of time used for gait training with straight cane with rubber quad tip (as pt has progressed very well with rollator). Patient demonstrated correct use of cane and had no LOB. She reports she does not have a cane and plans to ask MD for a prescription on her next visit. Educated patient that insurance is unlikely to pay for a cane due to recently paid for rollator. Patient plans to follow-up with her insurance to determine if it will be covered.    Rehab Potential Good   Clinical Impairments Affecting Rehab Potential depression; cognition--attention and/or memory deficits   PT Duration --  discussed possible advantage of biweekly over 6 weeks; pt requested 3 week duration   Consulted and Agree with Plan of Care Patient      Patient will benefit from skilled therapeutic intervention in order to improve the following deficits and impairments:     Visit Diagnosis: Muscle weakness (generalized)  Hemiparesis affecting nondominant side as late effect of cerebrovascular accident [facility])  Abnormality of gait     Problem List Patient  Active Problem List   Diagnosis Date Noted  . CVA (cerebral vascular accident) ([place]) 10/25/2016  . Post-menopausal bleeding 09/24/2016  . OSA (obstructive sleep apnea) 09/03/2015  . Heart valve vegetation 09/03/2015  . Snoring 05/02/2015  . Essential hypertension 05/02/2015  . Tobacco use disorder 05/02/2015  . Headache 05/01/2015  . Dysphagia, post-stroke 02/20/2015  . Former smoker 01/24/2015  . Alteration of sensations, post-stroke 12/22/2014  . Cognitive deficit, post-stroke 11/23/2014  . Left hemiparesis ([place]) 10/23/2014  . Cardiomyopathy due to hypertension ([place]) 10/23/2014  . Chronic ischemic vertebrobasilar artery thalamic stroke   . Aortic valve vegetation   . Hypertensive heart disease   . Overweight (BMI 25.0-29.9)   . H/O noncompliance with medical  treatment, presenting hazards to health   . Aortic regurgitation   . Cerebral infarction due to thrombosis of right middle cerebral artery ([place])   . Hyperlipidemia   . Embolic stroke involving right middle cerebral artery ([place]) 10/06/2014   PHYSICAL THERAPY DISCHARGE SUMMARY  Visits from Start of Care: 3  Current functional level related to goals / functional outcomes: Modified independent with rollator   Remaining deficits: Lt hemiparesis from prior CVA   Education / Equipment: HEP; use of cane  Plan: Patient agrees to discharge.  Patient goals were met. Patient is being discharged due to meeting the stated rehab goals.  ?????       Rexanne Mano, PT 02/03/2017, 8:29 PM  [facility] [address] [place], [place], [address] Phone: [phone]   Fax:  [phone]  Name: Brandy Roberts MRN: [id number] Date of Birth: 05/25/1971

## 2017-02-05 ENCOUNTER — Ambulatory Visit: Payer: Medicaid Other | Admitting: Rehabilitation

## 2017-02-06 ENCOUNTER — Other Ambulatory Visit: Payer: Self-pay | Admitting: Family Medicine

## 2017-02-06 DIAGNOSIS — Z8673 Personal history of transient ischemic attack (TIA), and cerebral infarction without residual deficits: Secondary | ICD-10-CM

## 2017-02-06 MED FILL — VENLAFAXINE HCL ER 150 MG C: 150 | 30 days supply | Qty: 30 | Fill #2 | Status: TO

## 2017-02-06 MED FILL — CLOPIDOGREL 75 MG TABLET: 75 | 30 days supply | Qty: 30 | Fill #0

## 2017-02-06 MED FILL — AMLODIPINE BESYLATE 10 MG T: 10 | 30 days supply | Qty: 15 | Fill #7

## 2017-02-06 MED FILL — ATORVASTATIN 80 MG TABLET: 80 | 30 days supply | Qty: 30 | Fill #2 | Status: TO

## 2017-02-10 ENCOUNTER — Ambulatory Visit
Admission: RE | Admit: 2017-02-10 | Discharge: 2017-02-10 | Disposition: A | Discharge status: Home / Self Care | Payer: Medicaid Other | Source: Ambulatory Visit | Attending: Family Medicine | Admitting: Family Medicine

## 2017-02-10 DIAGNOSIS — N63 Unspecified lump in unspecified breast: Secondary | ICD-10-CM

## 2017-02-13 ENCOUNTER — Ambulatory Visit: Payer: Medicaid Other | Admitting: Family Medicine

## 2017-02-23 ENCOUNTER — Encounter: Payer: Self-pay | Admitting: Family Medicine

## 2017-02-23 ENCOUNTER — Ambulatory Visit: Payer: Medicaid Other | Attending: Family Medicine | Admitting: Family Medicine

## 2017-02-23 VITALS — BP 134/79 | HR 92 | Temp 98.2°F | Resp 18 | Ht 66.0 in | Wt 200.2 lb

## 2017-02-23 DIAGNOSIS — Z8673 Personal history of transient ischemic attack (TIA), and cerebral infarction without residual deficits: Secondary | ICD-10-CM | POA: Diagnosis not present

## 2017-02-23 DIAGNOSIS — S7012XA Contusion of left thigh, initial encounter: Secondary | ICD-10-CM

## 2017-02-23 DIAGNOSIS — I69354 Hemiplegia and hemiparesis following cerebral infarction affecting left non-dominant side: Secondary | ICD-10-CM | POA: Diagnosis not present

## 2017-02-23 DIAGNOSIS — E785 Hyperlipidemia, unspecified: Secondary | ICD-10-CM | POA: Diagnosis not present

## 2017-02-23 DIAGNOSIS — I10 Essential (primary) hypertension: Secondary | ICD-10-CM

## 2017-02-23 DIAGNOSIS — Z7902 Long term (current) use of antithrombotics/antiplatelets: Secondary | ICD-10-CM | POA: Insufficient documentation

## 2017-02-23 DIAGNOSIS — F17210 Nicotine dependence, cigarettes, uncomplicated: Secondary | ICD-10-CM | POA: Diagnosis not present

## 2017-02-23 DIAGNOSIS — G43909 Migraine, unspecified, not intractable, without status migrainosus: Secondary | ICD-10-CM | POA: Insufficient documentation

## 2017-02-23 DIAGNOSIS — Z79899 Other long term (current) drug therapy: Secondary | ICD-10-CM

## 2017-02-23 DIAGNOSIS — X58XXXA Exposure to other specified factors, initial encounter: Secondary | ICD-10-CM | POA: Insufficient documentation

## 2017-02-23 DIAGNOSIS — IMO0002 Reserved for concepts with insufficient information to code with codable children: Secondary | ICD-10-CM

## 2017-02-23 DIAGNOSIS — G43709 Chronic migraine without aura, not intractable, without status migrainosus: Secondary | ICD-10-CM | POA: Diagnosis not present

## 2017-02-23 DIAGNOSIS — G629 Polyneuropathy, unspecified: Secondary | ICD-10-CM | POA: Diagnosis not present

## 2017-02-23 MED ORDER — TOPIRAMATE 25 MG PO TABS: 25.0000 mg | ORAL_TABLET | Freq: Every day | ORAL | 2 refills | Status: DC

## 2017-02-23 MED ORDER — SPIRONOLACTONE 25 MG PO TABS: 25.0000 mg | ORAL_TABLET | Freq: Every day | ORAL | 2 refills | Status: DC

## 2017-02-23 MED ORDER — GABAPENTIN 300 MG PO CAPS: 300.0000 mg | ORAL_CAPSULE | Freq: Two times a day (BID) | ORAL | 2 refills | Status: DC

## 2017-02-23 MED ORDER — QUAD CANE MISC: 1.0000 | Freq: Once | 0 refills | Status: AC

## 2017-02-23 MED ORDER — CLOPIDOGREL BISULFATE 75 MG PO TABS: 75.0000 mg | ORAL_TABLET | Freq: Every day | ORAL | 2 refills | Status: DC

## 2017-02-23 MED ORDER — NICOTINE 21 MG/24HR TD PT24: 21.0000 mg | MEDICATED_PATCH | Freq: Every day | TRANSDERMAL | 2 refills | Status: DC

## 2017-02-23 MED ORDER — AMLODIPINE BESYLATE 10 MG PO TABS: 10.0000 mg | ORAL_TABLET | Freq: Every day | ORAL | 3 refills | Status: DC

## 2017-02-23 MED ORDER — LISINOPRIL 40 MG PO TABS: 40.0000 mg | ORAL_TABLET | Freq: Every day | ORAL | 2 refills | Status: DC

## 2017-02-23 MED FILL — LISINOPRIL 40 MG TABLET: 40 | 30 days supply | Qty: 30 | Fill #0

## 2017-02-23 MED FILL — TOPIRAMATE 25 MG TABLET: 25 | 30 days supply | Qty: 30 | Fill #0

## 2017-02-23 MED FILL — SPIRONOLACTONE 25 MG TABLET: 25 | 30 days supply | Qty: 30 | Fill #0

## 2017-02-23 MED FILL — GABAPENTIN 300 MG CAPSULE: 300 | 30 days supply | Qty: 60 | Fill #0

## 2017-02-23 MED FILL — NICOTINE 21 MG/24HR PATCH: 21 | 28 days supply | Qty: 28 | Fill #0

## 2017-02-23 MED FILL — AMLODIPINE BESYLATE 10 MG T: 10 | 30 days supply | Qty: 30 | Fill #0

## 2017-02-23 NOTE — Patient Instructions (Addendum)
You will be called with your labs results. Bring medication with you to next office visit. Case management will contact you to follow up.     DASH Eating Plan DASH stands for "Dietary Approaches to Stop Hypertension." The DASH eating plan is a healthy eating plan that has been shown to reduce high blood pressure (hypertension). It may also reduce your risk for type 2 diabetes, heart disease, and stroke. The DASH eating plan may also help with weight loss. What are tips for following this plan? General guidelines  Avoid eating more than 2,300 mg (milligrams) of salt (sodium) a day. If you have hypertension, you may need to reduce your sodium intake to 1,500 mg a day.  Limit alcohol intake to no more than 1 drink a day for nonpregnant women and 2 drinks a day for men. One drink equals 12 oz of beer, 5 oz of wine, or 1 oz of hard liquor.  Work with your health care provider to maintain a healthy body weight or to lose weight. Ask what an ideal weight is for you.  Get at least 30 minutes of exercise that causes your heart to beat faster (aerobic exercise) most days of the week. Activities may include walking, swimming, or biking.  Work with your health care provider or diet and nutrition specialist (dietitian) to adjust your eating plan to your individual calorie needs. Reading food labels  Check food labels for the amount of sodium per serving. Choose foods with less than 5 percent of the Daily Value of sodium. Generally, foods with less than 300 mg of sodium per serving fit into this eating plan.  To find whole grains, look for the word "whole" as the first word in the ingredient list. Shopping  Buy products labeled as "low-sodium" or "no salt added."  Buy fresh foods. Avoid canned foods and premade or frozen meals. Cooking  Avoid adding salt when cooking. Use salt-free seasonings or herbs instead of table salt or sea salt. Check with your health care provider or pharmacist before  using salt substitutes.  Do not fry foods. Cook foods using healthy methods such as baking, boiling, grilling, and broiling instead.  Cook with heart-healthy oils, such as olive, canola, soybean, or sunflower oil. Meal planning   Eat a balanced diet that includes: ? 5 or more servings of fruits and vegetables each day. At each meal, try to fill half of your plate with fruits and vegetables. ? Up to 6-8 servings of whole grains each day. ? Less than 6 oz of lean meat, poultry, or fish each day. A 3-oz serving of meat is about the same size as a deck of cards. One egg equals 1 oz. ? 2 servings of low-fat dairy each day. ? A serving of nuts, seeds, or beans 5 times each week. ? Heart-healthy fats. Healthy fats called Omega-3 fatty acids are found in foods such as flaxseeds and coldwater fish, like sardines, salmon, and mackerel.  Limit how much you eat of the following: ? Canned or prepackaged foods. ? Food that is high in trans fat, such as fried foods. ? Food that is high in saturated fat, such as fatty meat. ? Sweets, desserts, sugary drinks, and other foods with added sugar. ? Full-fat dairy products.  Do not salt foods before eating.  Try to eat at least 2 vegetarian meals each week.  Eat more home-cooked food and less restaurant, buffet, and fast food.  When eating at a restaurant, ask that your food be  prepared with less salt or no salt, if possible. What foods are recommended? The items listed may not be a complete list. Talk with your dietitian about what dietary choices are best for you. Grains Whole-grain or whole-wheat bread. Whole-grain or whole-wheat pasta. Brown rice. [place]. Bulgur. Whole-grain and low-sodium cereals. Pita bread. Low-fat, low-sodium crackers. Whole-wheat flour tortillas. Vegetables Fresh or frozen vegetables (raw, steamed, roasted, or grilled). Low-sodium or reduced-sodium tomato and vegetable juice. Low-sodium or reduced-sodium tomato sauce  and tomato paste. Low-sodium or reduced-sodium canned vegetables. Fruits All fresh, dried, or frozen fruit. Canned fruit in natural juice (without added sugar). Meat and other protein foods Skinless chicken or [place]. Ground chicken or [place]. Pork with fat trimmed off. Fish and seafood. Egg whites. Dried beans, peas, or lentils. Unsalted nuts, nut butters, and seeds. Unsalted canned beans. Lean cuts of beef with fat trimmed off. Low-sodium, lean deli meat. Dairy Low-fat (1%) or fat-free (skim) milk. Fat-free, low-fat, or reduced-fat cheeses. Nonfat, low-sodium ricotta or cottage cheese. Low-fat or nonfat yogurt. Low-fat, low-sodium cheese. Fats and oils Soft margarine without trans fats. Vegetable oil. Low-fat, reduced-fat, or light mayonnaise and salad dressings (reduced-sodium). Canola, safflower, olive, soybean, and sunflower oils. Avocado. Seasoning and other foods Herbs. Spices. Seasoning mixes without salt. Unsalted popcorn and pretzels. Fat-free sweets. What foods are not recommended? The items listed may not be a complete list. Talk with your dietitian about what dietary choices are best for you. Grains Baked goods made with fat, such as croissants, muffins, or some breads. Dry pasta or rice meal packs. Vegetables Creamed or fried vegetables. Vegetables in a cheese sauce. Regular canned vegetables (not low-sodium or reduced-sodium). Regular canned tomato sauce and paste (not low-sodium or reduced-sodium). Regular tomato and vegetable juice (not low-sodium or reduced-sodium). Angie Fava. Olives. Fruits Canned fruit in a light or heavy syrup. Fried fruit. Fruit in cream or butter sauce. Meat and other protein foods Fatty cuts of meat. Ribs. Fried meat. Berniece Salines. Sausage. Bologna and other processed lunch meats. Salami. Fatback. Hotdogs. Bratwurst. Salted nuts and seeds. Canned beans with added salt. Canned or smoked fish. Whole eggs or egg yolks. Chicken or [place] with skin. Dairy Whole or 2%  milk, cream, and half-and-half. Whole or full-fat cream cheese. Whole-fat or sweetened yogurt. Full-fat cheese. Nondairy creamers. Whipped toppings. Processed cheese and cheese spreads. Fats and oils Butter. Stick margarine. Lard. Shortening. Ghee. Bacon fat. Tropical oils, such as coconut, palm kernel, or palm oil. Seasoning and other foods Salted popcorn and pretzels. Onion salt, garlic salt, seasoned salt, table salt, and sea salt. Worcestershire sauce. Tartar sauce. Barbecue sauce. Teriyaki sauce. Soy sauce, including reduced-sodium. Steak sauce. Canned and packaged gravies. Fish sauce. Oyster sauce. Cocktail sauce. Horseradish that you find on the shelf. Ketchup. Mustard. Meat flavorings and tenderizers. Bouillon cubes. Hot sauce and Tabasco sauce. Premade or packaged marinades. Premade or packaged taco seasonings. Relishes. Regular salad dressings. Where to find more information:  National Heart, Lung, and [place]: https://wilson-eaton.com/  American Heart Association: www.heart.org Summary  The DASH eating plan is a healthy eating plan that has been shown to reduce high blood pressure (hypertension). It may also reduce your risk for type 2 diabetes, heart disease, and stroke.  With the DASH eating plan, you should limit salt (sodium) intake to 2,300 mg a day. If you have hypertension, you may need to reduce your sodium intake to 1,500 mg a day.  When on the DASH eating plan, aim to eat more fresh fruits and vegetables, whole grains,  lean proteins, low-fat dairy, and heart-healthy fats.  Work with your health care provider or diet and nutrition specialist (dietitian) to adjust your eating plan to your individual calorie needs. This information is not intended to replace advice given to you by your health care provider. Make sure you discuss any questions you have with your health care provider. Document Released: 05/08/2011 Document Revised: 05/12/2016 Document Reviewed:  05/12/2016 Elsevier Interactive Patient Education  2017 [place] A contusion is a deep bruise. Contusions happen when an injury causes bleeding under the skin. Symptoms of bruising include pain, swelling, and discolored skin. The skin may turn blue, purple, or yellow. Follow these instructions at home:  Rest the injured area.  If told, put ice on the injured area. ? Put ice in a plastic bag. ? Place a towel between your skin and the bag. ? Leave the ice on for 20 minutes, 2-3 times per day.  If told, put light pressure (compression) on the injured area using an elastic bandage. Make sure the bandage is not too tight. Remove it and put it back on as told by your doctor.  If possible, raise (elevate) the injured area above the level of your heart while you are sitting or lying down.  Take over-the-counter and prescription medicines only as told by your doctor. Contact a doctor if:  Your symptoms do not get better after several days of treatment.  Your symptoms get worse.  You have trouble moving the injured area. Get help right away if:  You have very bad pain.  You have a loss of feeling (numbness) in a hand or foot.  Your hand or foot turns pale or cold. This information is not intended to replace advice given to you by your health care provider. Make sure you discuss any questions you have with your health care provider. Document Released: 11/05/2007 Document Revised: 10/25/2015 Document Reviewed: 10/04/2014 Elsevier Interactive Patient Education  2018 [employer].

## 2017-02-23 NOTE — Progress Notes (Signed)
Patient is here for f/up HTN HLD  Patient has no pain  Patient has taking her current meds for today

## 2017-02-23 NOTE — Progress Notes (Deleted)
   Subjective:  Patient ID: Brandy Roberts, female    DOB: June 02, 1971  Age: 46 y.o. MRN: [id number]  CC: Hypertension   HPI Brandy Roberts presents for ***           Outpatient Medications Prior to Visit  Medication Sig Dispense Refill  . amLODipine (NORVASC) 10 MG tablet Take 0.5 tablets (5 mg total) by mouth daily. 30 tablet 3  . atorvastatin (LIPITOR) 80 MG tablet Take 1 tablet (80 mg total) by mouth daily at 6 PM. 90 tablet 3  . clopidogrel (PLAVIX) 75 MG tablet TAKE 1 TABLET BY MOUTH DAILY. 30 tablet 0  . fluticasone (FLONASE) 50 MCG/ACT nasal spray Place 2 sprays into both nostrils daily. (Patient taking differently: Place 2 sprays into both nostrils as needed for allergies or rhinitis. ) 16 g 0  . gabapentin (NEURONTIN) 300 MG capsule Take 1 capsule (300 mg total) by mouth 2 (two) times daily. 60 capsule 2  . hydrALAZINE (APRESOLINE) 50 MG tablet TAKE 1 TABLET BY MOUTH 3 TIMES DAILY WITH MEALS. (Patient taking differently: TAKE 50MG  BY MOUTH 3 TIMES DAILY WITH MEALS.) 90 tablet 2  . lisinopril (PRINIVIL,ZESTRIL) 20 MG tablet TAKE ONE TABLET BY MOUTH TWICE DAILY (Patient taking differently: TAKE 20MG  BY MOUTH TWICE DAILY) 60 tablet 2  . metoprolol tartrate (LOPRESSOR) 50 MG tablet TAKE ONE TABLET BY MOUTH TWICE DAILY (Patient taking differently: TAKE 50MG  BY MOUTH TWICE DAILY) 60 tablet 0  . nicotine (NICODERM CQ) 14 mg/24hr patch Place 1 patch (14 mg total) onto the skin daily. 28 patch 1  . ondansetron (ZOFRAN) 4 MG tablet Take 1 tablet (4 mg total) by mouth every 8 (eight) hours as needed for nausea or vomiting. 20 tablet 0  . spironolactone (ALDACTONE) 25 MG tablet TAKE 1 TABLET BY MOUTH DAILY (Patient taking differently: TAKE 25MG  BY MOUTH DAILY) 30 tablet 2  . traMADol (ULTRAM) 50 MG tablet Take 1 tablet (50 mg total) by mouth every 8 (eight) hours as needed for severe pain. 30 tablet 0  . venlafaxine XR (EFFEXOR-XR) 150 MG 24 hr capsule Take 1 capsule (150 mg total)  by mouth daily with breakfast. 30 capsule 5   No facility-administered medications prior to visit.     ROS Review of Systems     Objective:  BP (!) 147/78 (BP Location: Left Arm, Patient Position: Sitting, Cuff Size: Normal)   Pulse 92   Temp 98.2 F (36.8 C) (Oral)   Resp 18   Ht 5\' 6"  (1.676 m)   Wt 200 lb 3.2 oz (90.8 kg)   SpO2 96%   BMI 32.31 kg/m   BP/Weight 02/23/2017 01/12/2017 7/35/3299  Systolic BP 242 683 419  Diastolic BP 78 68 88  Wt. (Lbs) 200.2 - -  BMI 32.31 - -  Some encounter information is confidential and restricted. Go to Review Flowsheets activity to see all data.     Physical Exam   Assessment & Plan:   Problem List Items Addressed This Visit    None      No orders of the defined types were placed in this encounter.   Follow-up: No Follow-up on file.   [name]

## 2017-02-24 ENCOUNTER — Other Ambulatory Visit: Payer: Self-pay | Admitting: Family Medicine

## 2017-02-24 ENCOUNTER — Telehealth: Payer: Self-pay

## 2017-02-24 DIAGNOSIS — I10 Essential (primary) hypertension: Secondary | ICD-10-CM

## 2017-02-24 LAB — BASIC METABOLIC PANEL
BUN / CREAT RATIO: 20 (ref 9–23)
BUN: 20 mg/dL (ref 6–24)
CALCIUM: 9.5 mg/dL (ref 8.7–10.2)
CHLORIDE: 108 mmol/L — AB (ref 96–106)
CO2: 24 mmol/L (ref 20–29)
Creatinine, Ser: 0.99 mg/dL (ref 0.57–1.00)
GFR calc non Af Amer: 68 mL/min/{1.73_m2} (ref >59)
GFR, EST AFRICAN AMERICAN: 78 mL/min/{1.73_m2} (ref >59)
Glucose: 81 mg/dL (ref 65–99)
POTASSIUM: 3.8 mmol/L (ref 3.5–5.2)
Sodium: 147 mmol/L — ABNORMAL HIGH (ref 134–144)

## 2017-02-24 MED FILL — METOPROLOL TARTRATE 50 MG T: 50 | 30 days supply | Qty: 60 | Fill #0

## 2017-02-24 NOTE — Telephone Encounter (Signed)
Order for quad cane faxed to [facility].  Call placed to [facility]. Spoke to [place] who confirmed that the patient has been approved from [facility] but she is not able to confirm how many hours of service the patient receives. She noted that [facility] # [phone], is providing the services.  Call placed to [facility]. Spoke to [place] who stated that the patient receives 55 hours/month of PCS.  Call placed to [facility], spoke to Estill Bamberg who stated that the patient was discharged from home health services in 12/2016.

## 2017-02-25 NOTE — Progress Notes (Signed)
Subjective:  Patient ID: Brandy Roberts, female    DOB: April 14, 1971  Age: 46 y.o. MRN: [id number]  CC: Hypertension   HPI [name] presents for follow up. PMH of CVA with residual left sided weakness, HTN, and tobacco use.  She is not exercising and is not adherent to low salt diet. She does not check BP at home. Cardiac symptoms none. Patient denies chest pain, chest pressure/discomfort, claudication, lower extremity edema, near-syncope, palpitations and syncope.  Cardiovascular risk factors: dyslipidemia, hypertension, sedentary lifestyle and smoking/ tobacco exposure. Use of agents associated with hypertension: none. History of target organ damage: stroke. She reports its was recommended by PT that she receive 4 point cane to assist mobility. She is also requesting medication management. She reports forgetfulness with medications and difficulty with use of pill box. She does not bring current medications with her to office visit. History of chronic migraine headaches. The headaches are usually moderate to severe in intensity, 9 to 10/10 at the worst and are bilateral, parietal, frontal in location. Recently, the headaches are increasing in frequency. Reports headaches 1 to 2 times per week. Precipitating factors include none which have been determined. The headaches are usually not preceded by an aura. The patient denies numbness of extremities, vision problems and vomiting in the early morning. Other associated symptoms include: dizziness.  Home treatment has included fiorcet use with fair improvement. Other history includes: migraine headaches diagnosed in the past and history of CVA with residual effects.     Outpatient Medications Prior to Visit  Medication Sig Dispense Refill  . atorvastatin (LIPITOR) 80 MG tablet Take 1 tablet (80 mg total) by mouth daily at 6 PM. 90 tablet 3  . fluticasone (FLONASE) 50 MCG/ACT nasal spray Place 2 sprays into both nostrils daily. (Patient taking  differently: Place 2 sprays into both nostrils as needed for allergies or rhinitis. ) 16 g 0  . hydrALAZINE (APRESOLINE) 50 MG tablet TAKE 1 TABLET BY MOUTH 3 TIMES DAILY WITH MEALS. (Patient taking differently: TAKE 50MG  BY MOUTH 3 TIMES DAILY WITH MEALS.) 90 tablet 2  . ondansetron (ZOFRAN) 4 MG tablet Take 1 tablet (4 mg total) by mouth every 8 (eight) hours as needed for nausea or vomiting. 20 tablet 0  . traMADol (ULTRAM) 50 MG tablet Take 1 tablet (50 mg total) by mouth every 8 (eight) hours as needed for severe pain. 30 tablet 0  . venlafaxine XR (EFFEXOR-XR) 150 MG 24 hr capsule Take 1 capsule (150 mg total) by mouth daily with breakfast. 30 capsule 5  . amLODipine (NORVASC) 10 MG tablet Take 0.5 tablets (5 mg total) by mouth daily. 30 tablet 3  . clopidogrel (PLAVIX) 75 MG tablet TAKE 1 TABLET BY MOUTH DAILY. 30 tablet 0  . gabapentin (NEURONTIN) 300 MG capsule Take 1 capsule (300 mg total) by mouth 2 (two) times daily. 60 capsule 2  . lisinopril (PRINIVIL,ZESTRIL) 20 MG tablet TAKE ONE TABLET BY MOUTH TWICE DAILY (Patient taking differently: TAKE 20MG  BY MOUTH TWICE DAILY) 60 tablet 2  . metoprolol tartrate (LOPRESSOR) 50 MG tablet TAKE ONE TABLET BY MOUTH TWICE DAILY (Patient taking differently: TAKE 50MG  BY MOUTH TWICE DAILY) 60 tablet 0  . nicotine (NICODERM CQ) 14 mg/24hr patch Place 1 patch (14 mg total) onto the skin daily. 28 patch 1  . spironolactone (ALDACTONE) 25 MG tablet TAKE 1 TABLET BY MOUTH DAILY (Patient taking differently: TAKE 25MG  BY MOUTH DAILY) 30 tablet 2   No facility-administered medications prior to visit.  ROS Review of Systems  HENT: Negative.  Negative for trouble swallowing.   Eyes: Negative.   Respiratory: Negative.   Cardiovascular: Negative.   Gastrointestinal: Negative.   Musculoskeletal:       Weakness   Skin: Negative.   Neurological: Positive for weakness.  Psychiatric/Behavioral: Negative for suicidal ideas.       History of depression.      Objective:  BP 134/79   Pulse 92   Temp 98.2 F (36.8 C) (Oral)   Resp 18   Ht 5\' 6"  (1.676 m)   Wt 200 lb 3.2 oz (90.8 kg)   SpO2 96%   BMI 32.31 kg/m   BP/Weight 02/23/2017 01/12/2017 7/84/6962  Systolic BP 952 841 324  Diastolic BP 79 68 88  Wt. (Lbs) 200.2 - -  BMI 32.31 - -  Some encounter information is confidential and restricted. Go to Review Flowsheets activity to see all data.     Physical Exam  Constitutional: She is oriented to person, place, and time. She appears well-developed and well-nourished.  HENT:  Head: Normocephalic and atraumatic.  Right Ear: External ear normal.  Left Ear: External ear normal.  Nose: Nose normal.  Mouth/Throat: Oropharynx is clear and moist.  Eyes: Pupils are equal, round, and reactive to light. Conjunctivae are normal.  Neck: Normal range of motion. Neck supple. No JVD present.  Cardiovascular: Normal rate, regular rhythm, normal heart sounds and intact distal pulses.   Pulmonary/Chest: Effort normal and breath sounds normal.  Abdominal: Soft. Bowel sounds are normal. There is no tenderness.  Musculoskeletal:  3/5 muscle strength LUE/LLE; 4/5 muscle strength RUE/RLL .    Lymphadenopathy:    She has no cervical adenopathy.  Neurological: She is alert and oriented to person, place, and time. Gait abnormal.  Reflex Scores:      Tricep reflexes are 2+ on the right side and 2+ on the left side.      Bicep reflexes are 2+ on the right side and 2+ on the left side.      Brachioradialis reflexes are 2+ on the right side and 3+ on the left side.      Patellar reflexes are 2+ on the right side and 3+ on the left side. slurred  Skin: Skin is warm and dry.  Small area of ecchymosis to right anterior thigh.  Psychiatric: Her affect is blunt. She is slowed. She expresses no homicidal and no suicidal ideation. She expresses no suicidal plans and no homicidal plans.  Nursing note and vitals reviewed.   Assessment & Plan:   1. Chronic  migraine  - topiramate (TOPAMAX) 25 MG tablet; Take 1 tablet (25 mg total) by mouth at bedtime.  Dispense: 30 tablet; Refill: 2  2. Essential hypertension Schedule BP recheck in 2 weeks with clinic RN. If BP is greater than 90/60 (MAP 65 or greater) but not less than 130/80 may metoprolol 25 mg QD and recheck in another 2 weeks clinic RN.  - amLODipine (NORVASC) 10 MG tablet; Take 1 tablet (10 mg total) by mouth daily.  Dispense: 30 tablet; Refill: 3 - spironolactone (ALDACTONE) 25 MG tablet; Take 1 tablet (25 mg total) by mouth daily.  Dispense: 30 tablet; Refill: 2 - lisinopril (PRINIVIL,ZESTRIL) 40 MG tablet; Take 1 tablet (40 mg total) by mouth daily.  Dispense: 30 tablet; Refill: 2 - Basic Metabolic Panel  3. Contusion of left thigh, initial encounter -Apply warm compress to area. -Follow up if symptoms worsen   4. Hyperlipidemia, unspecified hyperlipidemia  type -Continue atorvastatin use.  5. Cigarette nicotine dependence without complication  - nicotine (NICODERM CQ - DOSED IN MG/24 HOURS) 21 mg/24hr patch; Place 1 patch (21 mg total) onto the skin daily.  Dispense: 21 patch; Refill: 2  6. Referred for medication therapy management  - Consult to care management  7. History of CVA (cerebrovascular accident) without residual deficits  - Consult to care management - Misc. Devices (QUAD CANE) MISC; 1 Device by Does not apply route once.  Dispense: 1 each; Refill: 0 - clopidogrel (PLAVIX) 75 MG tablet; Take 1 tablet (75 mg total) by mouth daily.  Dispense: 30 tablet; Refill: 2  8. Neuropathy  - gabapentin (NEURONTIN) 300 MG capsule; Take 1 capsule (300 mg total) by mouth 2 (two) times daily.  Dispense: 60 capsule; Refill: 2    Meds ordered this encounter  Medications  . nicotine (NICODERM CQ - DOSED IN MG/24 HOURS) 21 mg/24hr patch    Sig: Place 1 patch (21 mg total) onto the skin daily.    Dispense:  21 patch    Refill:  2    Order Specific Question:   Supervising  Provider    Answer:   Tresa Garter [id number]  . topiramate (TOPAMAX) 25 MG tablet    Sig: Take 1 tablet (25 mg total) by mouth at bedtime.    Dispense:  30 tablet    Refill:  2    Order Specific Question:   Supervising Provider    Answer:   Tresa Garter [id number]  . Misc. Devices (QUAD CANE) MISC    Sig: 1 Device by Does not apply route once.    Dispense:  1 each    Refill:  0    Order Specific Question:   Supervising Provider    Answer:   Tresa Garter [id number]  . gabapentin (NEURONTIN) 300 MG capsule    Sig: Take 1 capsule (300 mg total) by mouth 2 (two) times daily.    Dispense:  60 capsule    Refill:  2    Order Specific Question:   Supervising Provider    Answer:   Tresa Garter [id number]  . amLODipine (NORVASC) 10 MG tablet    Sig: Take 1 tablet (10 mg total) by mouth daily.    Dispense:  30 tablet    Refill:  3    Order Specific Question:   Supervising Provider    Answer:   Tresa Garter [id number]  . spironolactone (ALDACTONE) 25 MG tablet    Sig: Take 1 tablet (25 mg total) by mouth daily.    Dispense:  30 tablet    Refill:  2    Order Specific Question:   Supervising Provider    Answer:   Tresa Garter [id number]  . lisinopril (PRINIVIL,ZESTRIL) 40 MG tablet    Sig: Take 1 tablet (40 mg total) by mouth daily.    Dispense:  30 tablet    Refill:  2    Order Specific Question:   Supervising Provider    Answer:   Tresa Garter [id number]  . clopidogrel (PLAVIX) 75 MG tablet    Sig: Take 1 tablet (75 mg total) by mouth daily.    Dispense:  30 tablet    Refill:  2    Order Specific Question:   Supervising Provider    Answer:   Tresa Garter [id number]    Follow-up: Return in about 1 week (around 03/02/2017) for BP check with [place].  [name]

## 2017-02-26 ENCOUNTER — Telehealth: Payer: Self-pay | Admitting: Family Medicine

## 2017-02-26 NOTE — Telephone Encounter (Signed)
Call placed to [facility] [phone] regarding patient quad cane rx sent to them. Spoke with Darnelle Bos and she informed me that rx was received and patient was contacted. As of today, patient hasn't picked it up.

## 2017-03-02 ENCOUNTER — Ambulatory Visit: Payer: Medicaid Other | Attending: Family Medicine | Admitting: *Deleted

## 2017-03-02 VITALS — BP 114/74 | HR 63

## 2017-03-02 DIAGNOSIS — I10 Essential (primary) hypertension: Secondary | ICD-10-CM | POA: Diagnosis present

## 2017-03-02 NOTE — Patient Instructions (Signed)
Check heart rate for 1 minute. If your heart rate is lower that 60 or if you have dizziness please call office to notify your primary care provider.  You may check heart rate for 30 secs and multiply by 2.  Example: 40 (pulse)  x 2 =  80 (pulse)

## 2017-03-03 ENCOUNTER — Telehealth: Payer: Self-pay

## 2017-03-03 NOTE — Progress Notes (Signed)
Patient ask if you have put a order for advanced to call her regarding her BP check

## 2017-03-03 NOTE — Progress Notes (Signed)
Pt arrived to [facility]. Pt alert and oriented. Pt arrives in good spirits. Last OV 02/23/2017  with PCP.   Pt denies chest pain, SOB, HA, dizziness, or blurred vision.  Reviewed medication. Pt questions if she needed to continue Metoprolol. She states she has not taken this medication in 3 days. Verified with PCP, pt needs to continue Metoprolol as prescribed.   Blood pressure reading: 114/74 HR: 63  Take metoprolol as directed.  Instructed to call for heart rate lower than 60 or having dizziness. Education provided how to check heart rate.   Pt verified understanding.

## 2017-03-03 NOTE — Telephone Encounter (Signed)
-----   Message from [name], [place] sent at 03/02/2017  4:45 PM EDT ----- Creatinine level have improved. High creatinine can indicate decreased kidney function over time. Kidney function normal.  Labs normal.

## 2017-03-03 NOTE — Telephone Encounter (Signed)
CMA call regarding alb results   Patient Verify DOB   Patient was aware and understood

## 2017-04-03 MED FILL — GABAPENTIN 300 MG CAPSULE: 300 | 30 days supply | Qty: 60 | Fill #1

## 2017-04-03 MED FILL — METOPROLOL TARTRATE 50 MG T: 50 | 30 days supply | Qty: 60 | Fill #1

## 2017-04-03 MED FILL — TOPIRAMATE 25 MG TABLET: 25 | 30 days supply | Qty: 30 | Fill #1

## 2017-04-03 MED FILL — SPIRONOLACTONE 25 MG TABLET: 25 | 30 days supply | Qty: 30 | Fill #1

## 2017-04-07 MED FILL — LISINOPRIL 40 MG TABS: 40 | 30 days supply | Qty: 30 | Fill #1

## 2017-04-20 ENCOUNTER — Ambulatory Visit (INDEPENDENT_AMBULATORY_CARE_PROVIDER_SITE_OTHER): Payer: Medicare Other | Admitting: Neurology

## 2017-04-20 ENCOUNTER — Encounter: Payer: Self-pay | Admitting: Neurology

## 2017-04-20 VITALS — BP 110/60 | HR 64 | Ht 66.0 in | Wt 200.0 lb

## 2017-04-20 DIAGNOSIS — F329 Major depressive disorder, single episode, unspecified: Secondary | ICD-10-CM

## 2017-04-20 DIAGNOSIS — I69959 Hemiplegia and hemiparesis following unspecified cerebrovascular disease affecting unspecified side: Secondary | ICD-10-CM

## 2017-04-20 DIAGNOSIS — G43709 Chronic migraine without aura, not intractable, without status migrainosus: Secondary | ICD-10-CM

## 2017-04-20 DIAGNOSIS — G43009 Migraine without aura, not intractable, without status migrainosus: Secondary | ICD-10-CM | POA: Diagnosis not present

## 2017-04-20 DIAGNOSIS — I10 Essential (primary) hypertension: Secondary | ICD-10-CM | POA: Diagnosis not present

## 2017-04-20 DIAGNOSIS — F32A Depression, unspecified: Secondary | ICD-10-CM

## 2017-04-20 DIAGNOSIS — IMO0002 Reserved for concepts with insufficient information to code with codable children: Secondary | ICD-10-CM

## 2017-04-20 MED ORDER — VENLAFAXINE HCL ER 150 MG PO CP24: 150.0000 mg | ORAL_CAPSULE | Freq: Every day | ORAL | 5 refills | Status: DC

## 2017-04-20 MED ORDER — TOPIRAMATE 25 MG PO TABS: 25.0000 mg | ORAL_TABLET | Freq: Every day | ORAL | 5 refills | Status: DC

## 2017-04-20 MED FILL — VENLAFAXINE HCL ER 150 MG C: 150 | 30 days supply | Qty: 30 | Fill #0

## 2017-04-20 NOTE — Progress Notes (Signed)
NEUROLOGY FOLLOW UP OFFICE NOTE  [name] [id number]  HISTORY OF PRESENT ILLNESS: Brandy Roberts is a 46 year old right-handed female with HTN, hyperlipidemia, and history of stroke who follows up for stroke and migraine.  She is accompanied by her sister who supplements history.   UPDATE: Last visit in June, her migraines were well controlled, occurring once or twice a months.  Afterwards, she has had an increase in headache frequency, requiring two ED visits.  CT of head from 01/12/17 was personally reviewed and revealed no acute changes.  Her PCP started her on topiramate 25mg  at bedtime.  She is doing well.  She hasn't had a migraine for a couple of months.  Depression is much better since increase in venlafaxine. Intensity:  8/10 Duration:  2 hours Frequency:  Not for 2 months. Frequency of abortive medication: infrequent Current NSAIDS:  no Current analgesics: Fioricet, tramadol Current triptans:  no Current anti-emetic:  Zofran 4mg  Current muscle relaxants:  no Current anti-anxiolytic:  no Current sleep aide:  no Current Antihypertensive medications:  amlodipine 5mg , hydralazine 50mg  three times daily, spironolactone, Lisinopril Current Antidepressant medications:  venlafaxine XR 150mg  Current Anticonvulsant medications:  topiramate 25mg , gabapentin 300mg  Current Vitamins/Herbal/Supplements:  no Current Antihistamines/Decongestants:  no Other therapy:  no   Caffeine:  Coffee, sweet tea Alcohol:  no Smoker:  Yes (cigarettes) Diet:  hydrates Exercise:  no Depression/anxiety:  Depression since the stroke.  She reports increased moodiness. Sleep hygiene:  Sometimes tosses and turns   HISTORY:  Onset:  Migraines since she was young, but worse since her stroke in May 2016. Location:  holocephalic Quality:  pounding Initial Intensity:  10/10 Aura:  no Prodrome:  no Postdrome:  no Associated symptoms:  Photophobia, phonophobia.  Rarely nausea.  She has not had  any new worse headache of her life, waking up from sleep Initial Duration:  2 days (but Fioricet and ibuprofen lowers intensity down to 2-3/10) Initial Frequency:  Once or twice a month Frequency of abortive medication: only as needed Triggers/exacerbating factors:  no Relieving factors:  Laying down.  Butalbital, ibuprofen.  Tramadol helped but she was told not to take it. Activity:  aggravates   Past NSAIDS:  Ibuprofen (advised to stop NSAIDs as she is on Plavix) Past analgesics:  Tylenol, Excedrin, Tramadol (effective) Past abortive triptans:  no Past muscle relaxants:  no Past anti-emetic:  no Past antihypertensive medications:  no Past antidepressant medications:  no Past anticonvulsant medications:  no Past vitamins/Herbal/Supplements:  no Other past therapies:  no   Family history of headache:  Sister.  STROKE:  She had a stroke in May 2016 with left sided weakness.  MRI of brain from 10/06/14 was personally reviewed and revealed small acute lacunar infarcts in the right thalamus and right corona radiata, as well as chronic lacunar infarcts in the left hemisphere.  CTA of head and neck revealed diffuse bilateral petrous and cavernous carotid stenosis.  Cardiac source of embolus was not discovered.    She was admitted to [facility] from 10/24/16 to 10/26/16 for stroke-like event.  She suddenly developed left sided weakness and slurred speech with associated headache.  Blood pressure in ED was 120/66.  CT of head was personally reviewed and negative for acute abnormality.  She had refused tPA.  MRI of brain was personally reviewed and revealed no acute stroke or bleed.  MRA of head revealed no significant intracranial stenosis or occlusion.  LDL was 84.  Hgb A1c was 5.7.  As per neurology evaluation, her deficits appeared to be non-organic, with unusual speech pattern and giveaway weakness.  Somatization was suspected.  2D echo from 11/06/16 demonstrated normal LV EF of 65-70% with no  cardiac source of emboli.  Atorvastatin was increased from 10mg  to 80mg  daily.  Differential includes TIA vs hemiplegic migraine vs somatization.  PAST MEDICAL HISTORY: Past Medical History:  Diagnosis Date  . Headache   . Hyperlipidemia   . Hypertension   . Stroke [facility])     MEDICATIONS: Current Outpatient Medications on File Prior to Visit  Medication Sig Dispense Refill  . amLODipine (NORVASC) 10 MG tablet Take 1 tablet (10 mg total) by mouth daily. 30 tablet 3  . atorvastatin (LIPITOR) 80 MG tablet Take 1 tablet (80 mg total) by mouth daily at 6 PM. 90 tablet 3  . butalbital-acetaminophen-caffeine (FIORICET WITH CODEINE) 50-325-40-30 MG capsule Take 1 capsule by mouth every 6 (six) hours as needed.    . clopidogrel (PLAVIX) 75 MG tablet Take 1 tablet (75 mg total) by mouth daily. 30 tablet 2  . gabapentin (NEURONTIN) 300 MG capsule Take 1 capsule (300 mg total) by mouth 2 (two) times daily. 60 capsule 2  . hydrALAZINE (APRESOLINE) 50 MG tablet TAKE 1 TABLET BY MOUTH 3 TIMES DAILY WITH MEALS. (Patient taking differently: TAKE 50MG  BY MOUTH 3 TIMES DAILY WITH MEALS.) 90 tablet 2  . lisinopril (PRINIVIL,ZESTRIL) 40 MG tablet Take 1 tablet (40 mg total) by mouth daily. 30 tablet 2  . metoprolol tartrate (LOPRESSOR) 50 MG tablet TAKE ONE TABLET BY MOUTH TWICE DAILY (Patient not taking: Reported on 03/02/2017) 60 tablet 2  . nicotine (NICODERM CQ - DOSED IN MG/24 HOURS) 21 mg/24hr patch Place 1 patch (21 mg total) onto the skin daily. 21 patch 2  . ondansetron (ZOFRAN) 4 MG tablet Take 1 tablet (4 mg total) by mouth every 8 (eight) hours as needed for nausea or vomiting. 20 tablet 0  . spironolactone (ALDACTONE) 25 MG tablet Take 1 tablet (25 mg total) by mouth daily. 30 tablet 2  . traMADol (ULTRAM) 50 MG tablet Take 1 tablet (50 mg total) by mouth every 8 (eight) hours as needed for severe pain. 30 tablet 0   No current facility-administered medications on file prior to visit.      ALLERGIES: Allergies  Allergen Reactions  . Tylenol [Acetaminophen] Other (See Comments)    Pt stated tylenol gives her extreme headache    FAMILY HISTORY: Family History  Problem Relation Age of Onset  . Cancer Mother   . Hypertension Mother   . Hypertension Sister     SOCIAL HISTORY: Social History   Socioeconomic History  . Marital status: Single    Spouse name: Not on file  . Number of children: 2  . Years of education: 18  . Highest education level: Not on file  Social Needs  . Financial resource strain: Not on file  . Food insecurity - worry: Not on file  . Food insecurity - inability: Not on file  . Transportation needs - medical: Not on file  . Transportation needs - non-medical: Not on file  Occupational History  . Occupation: N/A  Tobacco Use  . Smoking status: Former Smoker    Packs/day: 0.25  . Smokeless tobacco: Never Used  Substance and Sexual Activity  . Alcohol use: No    Alcohol/week: 0.0 oz  . Drug use: No  . Sexual activity: Not on file  Other Topics Concern  . Not on  file  Social History Narrative   Patient lives with her uncle in a one story home.  Has 2 sons.  Currently on disability.  Education: high school.   Drinks 1-2 sodas a week     REVIEW OF SYSTEMS: Constitutional: No fevers, chills, or sweats, no generalized fatigue, change in appetite Eyes: No visual changes, double vision, eye pain Ear, nose and throat: No hearing loss, ear pain, nasal congestion, sore throat Cardiovascular: No chest pain, palpitations Respiratory:  No shortness of breath at rest or with exertion, wheezes GastrointestinaI: No nausea, vomiting, diarrhea, abdominal pain, fecal incontinence Genitourinary:  No dysuria, urinary retention or frequency Musculoskeletal:  No neck pain, back pain Integumentary: No rash, pruritus, skin lesions Neurological: as above Psychiatric: No depression, insomnia, anxiety Endocrine: No palpitations, fatigue, diaphoresis,  mood swings, change in appetite, change in weight, increased thirst Hematologic/Lymphatic:  No purpura, petechiae. Allergic/Immunologic: no itchy/runny eyes, nasal congestion, recent allergic reactions, rashes  PHYSICAL EXAM: Vitals:   04/20/17 1104  BP: 110/60  Pulse: 64  SpO2: 91%   General: No acute distress.  Patient appears well-groomed.  Head:  Normocephalic/atraumatic Eyes:  Fundi examined but not visualized Neck: supple, no paraspinal tenderness, full range of motion Heart:  Regular rate and rhythm Lungs:  Clear to auscultation bilaterally Back: No paraspinal tenderness Neurological Exam: MS:  alert and oriented to person, place, and time. Attention span and concentration intact, recent and remote memory intact, fund of knowledge intact.  Speech fluent and not dysarthric, language intact.  CN:   II-XII intact. Muscle:  Bulk and tone normal, muscle strength Motor:  5-/5 left deltoid and bicep, 4+/5 left tricep, hand grip and left lower extremity.  5/5 on right.  Reduced amplitude and speed of finger tapping on left, but arrhythmic.  Sensation: Mildly reduced temperature and vibration sensation intact in left upper and lower extremities.  Deep Tendon Reflexes:  3+ throughout, slightly more brisk on the left.  Finger to nose testing:  questionable dysmetria.  Left hemiparetic gait.    IMPRESSION: 1  Migraine without aura. 2  Hemiplegia of left side as late effect of stroke 3.  Depression  PLAN: 1.  Continue venlafaxine XR 150mg  and topiramate 25mg  daily 2.  Fioricet/tramadol sparingly as needed for acute attacks. 3.  Plavix, statin therapy (LDL goal less than 70) and blood pressure control as per PCP for secondary stroke prevention 4.  Follow up in 6 months.  Metta Clines, DO  CC:  Fredia Beets, FNP

## 2017-04-20 NOTE — Patient Instructions (Signed)
1.  I refilled for you the venlafaxine XR 150mg  daily and topiramate 25mg  daily 2.  Follow up in 6 months.

## 2017-04-21 MED FILL — AMLODIPINE BESYLATE 10 MG T: 10 | 30 days supply | Qty: 30 | Fill #1

## 2017-04-21 MED FILL — NICOTINE 14 MG/24HR PATCH: 14 | 28 days supply | Qty: 28 | Fill #1

## 2017-04-21 NOTE — Telephone Encounter (Signed)
This encounter was created in error - please disregard.

## 2017-04-22 MED FILL — hydrALAZINE HCL 50 MG TABS: 50 | 30 days supply | Qty: 90 | Fill #0

## 2017-04-22 MED FILL — CLOPIDOGREL 75 MG TABLET: 75 | 30 days supply | Qty: 30 | Fill #0

## 2017-04-24 DIAGNOSIS — R112 Nausea with vomiting, unspecified: Secondary | ICD-10-CM | POA: Diagnosis not present

## 2017-04-24 DIAGNOSIS — R109 Unspecified abdominal pain: Secondary | ICD-10-CM | POA: Diagnosis not present

## 2017-05-08 ENCOUNTER — Telehealth: Payer: Self-pay | Admitting: Family Medicine

## 2017-05-08 NOTE — Telephone Encounter (Signed)
Patient called stating that there is a recall on two of her medications and would like to speak with a nurse.

## 2017-05-08 NOTE — Telephone Encounter (Signed)
Spoke to patient. Informed that medications under recall are none of which she is taking. Pt explains that she feels terrible. Admits to Nausea and vomiting intermittently since Thanksgiving. On most days she has 2 emesis episodes throughout the day. Unable to tolerate meals. Not peeing a lot and has a had headache x 2 days. She states, "Heartburn is killing me" Advised patient to have further evaluation and treatment in ED.

## 2017-05-08 NOTE — Telephone Encounter (Signed)
CMA spoke with patient   CMA advice patient that she needs to call where she got her RX to see if the lot # she has its effected also patient stated that the medication is making her sick   CMA advice her to make an appt so she can be evaluated by her pcp

## 2017-05-12 ENCOUNTER — Ambulatory Visit: Payer: Medicaid Other | Admitting: Family Medicine

## 2017-05-15 ENCOUNTER — Ambulatory Visit: Payer: Medicaid Other | Admitting: Family Medicine

## 2017-05-19 ENCOUNTER — Ambulatory Visit: Payer: Medicare Other | Attending: Family Medicine | Admitting: Family Medicine

## 2017-05-19 ENCOUNTER — Other Ambulatory Visit: Payer: Self-pay | Admitting: Family Medicine

## 2017-05-19 ENCOUNTER — Encounter: Payer: Self-pay | Admitting: Family Medicine

## 2017-05-19 VITALS — BP 121/70 | HR 73 | Temp 99.5°F | Resp 18 | Ht 66.0 in | Wt 204.0 lb

## 2017-05-19 DIAGNOSIS — R1310 Dysphagia, unspecified: Secondary | ICD-10-CM | POA: Diagnosis not present

## 2017-05-19 DIAGNOSIS — G43909 Migraine, unspecified, not intractable, without status migrainosus: Secondary | ICD-10-CM | POA: Insufficient documentation

## 2017-05-19 DIAGNOSIS — Z8673 Personal history of transient ischemic attack (TIA), and cerebral infarction without residual deficits: Secondary | ICD-10-CM | POA: Insufficient documentation

## 2017-05-19 DIAGNOSIS — G43709 Chronic migraine without aura, not intractable, without status migrainosus: Secondary | ICD-10-CM

## 2017-05-19 DIAGNOSIS — K2100 Gastro-esophageal reflux disease with esophagitis, without bleeding: Secondary | ICD-10-CM

## 2017-05-19 DIAGNOSIS — Z7902 Long term (current) use of antithrombotics/antiplatelets: Secondary | ICD-10-CM | POA: Insufficient documentation

## 2017-05-19 DIAGNOSIS — I6389 Other cerebral infarction: Secondary | ICD-10-CM | POA: Diagnosis not present

## 2017-05-19 DIAGNOSIS — Z79899 Other long term (current) drug therapy: Secondary | ICD-10-CM | POA: Diagnosis not present

## 2017-05-19 DIAGNOSIS — R112 Nausea with vomiting, unspecified: Secondary | ICD-10-CM

## 2017-05-19 DIAGNOSIS — I10 Essential (primary) hypertension: Secondary | ICD-10-CM | POA: Insufficient documentation

## 2017-05-19 DIAGNOSIS — IMO0002 Reserved for concepts with insufficient information to code with codable children: Secondary | ICD-10-CM

## 2017-05-19 MED ORDER — METOPROLOL TARTRATE 50 MG PO TABS: 50.0000 mg | ORAL_TABLET | Freq: Two times a day (BID) | ORAL | 5 refills | Status: DC

## 2017-05-19 MED ORDER — TOPIRAMATE 50 MG PO TABS: 50.0000 mg | ORAL_TABLET | Freq: Every day | ORAL | 1 refills | Status: DC

## 2017-05-19 MED ORDER — PANTOPRAZOLE SODIUM 20 MG PO TBEC: 20.0000 mg | DELAYED_RELEASE_TABLET | Freq: Every day | ORAL | 5 refills | Status: DC

## 2017-05-19 MED ORDER — HYDRALAZINE HCL 50 MG PO TABS: ORAL_TABLET | ORAL | 5 refills | Status: DC

## 2017-05-19 MED ORDER — AMLODIPINE BESYLATE 10 MG PO TABS: 10.0000 mg | ORAL_TABLET | Freq: Every day | ORAL | 1 refills | Status: DC

## 2017-05-19 MED ORDER — SPIRONOLACTONE 25 MG PO TABS
25.0000 mg | ORAL_TABLET | Freq: Every day | ORAL | 5 refills | Status: DC
Start: 2017-05-19 — End: 2017-11-04

## 2017-05-19 MED ORDER — LISINOPRIL 40 MG PO TABS: 40.0000 mg | ORAL_TABLET | Freq: Every day | ORAL | 1 refills | Status: DC

## 2017-05-19 MED FILL — PANTOPRAZOLE SOD DR 20 MG T: 20 | 30 days supply | Qty: 30 | Fill #0

## 2017-05-19 MED FILL — hydrALAZINE HCL 50 MG TABS: 50 | 30 days supply | Qty: 90 | Fill #0

## 2017-05-19 MED FILL — SPIRONOLACTONE 25 MG TABLET: 25 | 30 days supply | Qty: 30 | Fill #2

## 2017-05-19 MED FILL — AMLODIPINE BESYLATE 10 MG T: 10 | 30 days supply | Qty: 30 | Fill #0

## 2017-05-19 MED FILL — GABAPENTIN 300 MG CAPSULE: 300 | 30 days supply | Qty: 60 | Fill #2

## 2017-05-19 MED FILL — LISINOPRIL 40 MG TAB: 40 | 30 days supply | Qty: 30 | Fill #0

## 2017-05-19 MED FILL — TOPIRAMATE 50 MG TABLET: 50 | 30 days supply | Qty: 30 | Fill #0

## 2017-05-19 MED FILL — METOPROLOL TARTRATE 50 MG T: 50 | 30 days supply | Qty: 60 | Fill #2

## 2017-05-19 NOTE — Patient Instructions (Signed)
Food Choices for Gastroesophageal Reflux Disease, Adult When you have gastroesophageal reflux disease (GERD), the foods you eat and your eating habits are very important. Choosing the right foods can help ease your discomfort. What guidelines do I need to follow?  Choose fruits, vegetables, whole grains, and low-fat dairy products.  Choose low-fat meat, fish, and poultry.  Limit fats such as oils, salad dressings, butter, nuts, and avocado.  Keep a food diary. This helps you identify foods that cause symptoms.  Avoid foods that cause symptoms. These may be different for everyone.  Eat small meals often instead of 3 large meals a day.  Eat your meals slowly, in a place where you are relaxed.  Limit fried foods.  Cook foods using methods other than frying.  Avoid drinking alcohol.  Avoid drinking large amounts of liquids with your meals.  Avoid bending over or lying down until 2-3 hours after eating. What foods are not recommended? These are some foods and drinks that may make your symptoms worse: Vegetables  Tomatoes. Tomato juice. Tomato and spaghetti sauce. Chili peppers. Onion and garlic. Horseradish. Fruits  Oranges, grapefruit, and lemon (fruit and juice). Meats  High-fat meats, fish, and poultry. This includes hot dogs, ribs, ham, sausage, salami, and bacon. Dairy  Whole milk and chocolate milk. Sour cream. Cream. Butter. Ice cream. Cream cheese. Drinks  Coffee and tea. Bubbly (carbonated) drinks or energy drinks. Condiments  Hot sauce. Barbecue sauce. Sweets/Desserts  Chocolate and cocoa. Donuts. Peppermint and spearmint. Fats and Oils  High-fat foods. This includes French fries and potato chips. Other  Vinegar. Strong spices. This includes black pepper, white pepper, red pepper, cayenne, curry powder, cloves, ginger, and chili powder. The items listed above may not be a complete list of foods and drinks to avoid. Contact your dietitian for more information.    This information is not intended to replace advice given to you by your health care provider. Make sure you discuss any questions you have with your health care provider. Document Released: 11/18/2011 Document Revised: 10/25/2015 Document Reviewed: 03/23/2013 Elsevier Interactive Patient Education  2017 [employer]  

## 2017-05-19 NOTE — Progress Notes (Signed)
Subjective:  Patient ID: Brandy Roberts, female    DOB: 10/12/1970  Age: 46 y.o. MRN: [id number]  CC: Follow-up   HPI Lamiyah Schlotter presents for follow up. PMH of CVA with residual left sided weakness, HTN, and tobacco use.  She is not exercising and is not adherent to low salt diet. She does not check BP at home. Cardiac symptoms none. Patient denies chest pain, chest pressure/discomfort, claudication, lower extremity edema, near-syncope, palpitations and syncope.  Cardiovascular risk factors: dyslipidemia, hypertension, sedentary lifestyle and smoking/ tobacco exposure. Use of agents associated with hypertension: none. History of target organ damage: stroke. She ambulates with the assistance of a 4-point cane. She brings current medications with her to office visit. History of chronic migraine headaches. The headaches are usually moderate to severe in intensity, 9 to 10/10 at the worst and are bilateral, parietal, frontal in location. Recently, the headaches are increasing in frequency. Reports headaches 1 to 2 times per week. Precipitating factors include none which have been determined. The headaches are usually not preceded by an aura. The patient denies numbness of extremities, vision problems and vomiting in the early morning. Other associated symptoms include: dizziness.  Home treatment has included fiorcet use with fair improvement. Other history includes: migraine headaches diagnosed in the past and history of CVA with residual effects. She complains of heartburn. This has been associated with belching, bilious reflux, dysphagia, heartburn, midespigastric pain and nausea.  She denies hematemesis and melena. Symptoms have been present for a few months.. Medical therapy in the past has included ondansetron.  Outpatient Medications Prior to Visit  Medication Sig Dispense Refill  . atorvastatin (LIPITOR) 80 MG tablet Take 1 tablet (80 mg total) by mouth daily at 6 PM. 90 tablet 3  .  butalbital-acetaminophen-caffeine (FIORICET WITH CODEINE) 50-325-40-30 MG capsule Take 1 capsule by mouth every 6 (six) hours as needed.    . clopidogrel (PLAVIX) 75 MG tablet Take 1 tablet (75 mg total) by mouth daily. 30 tablet 2  . gabapentin (NEURONTIN) 300 MG capsule Take 1 capsule (300 mg total) by mouth 2 (two) times daily. 60 capsule 2  . nicotine (NICODERM CQ - DOSED IN MG/24 HOURS) 21 mg/24hr patch Place 1 patch (21 mg total) onto the skin daily. 21 patch 2  . traMADol (ULTRAM) 50 MG tablet Take 1 tablet (50 mg total) by mouth every 8 (eight) hours as needed for severe pain. 30 tablet 0  . venlafaxine XR (EFFEXOR-XR) 150 MG 24 hr capsule Take 1 capsule (150 mg total) daily with breakfast by mouth. 30 capsule 5  . amLODipine (NORVASC) 10 MG tablet Take 1 tablet (10 mg total) by mouth daily. 30 tablet 3  . hydrALAZINE (APRESOLINE) 50 MG tablet TAKE 1 TABLET BY MOUTH 3 TIMES DAILY WITH MEALS. (Patient taking differently: TAKE 50MG  BY MOUTH 3 TIMES DAILY WITH MEALS.) 90 tablet 2  . lisinopril (PRINIVIL,ZESTRIL) 40 MG tablet Take 1 tablet (40 mg total) by mouth daily. 30 tablet 2  . ondansetron (ZOFRAN) 4 MG tablet Take 1 tablet (4 mg total) by mouth every 8 (eight) hours as needed for nausea or vomiting. 20 tablet 0  . spironolactone (ALDACTONE) 25 MG tablet Take 1 tablet (25 mg total) by mouth daily. 30 tablet 2  . topiramate (TOPAMAX) 25 MG tablet Take 1 tablet (25 mg total) at bedtime by mouth. 30 tablet 5  . metoprolol tartrate (LOPRESSOR) 50 MG tablet TAKE ONE TABLET BY MOUTH TWICE DAILY (Patient not taking: Reported on 03/02/2017) 60  tablet 2   No facility-administered medications prior to visit.    Review of Systems  Constitutional: Negative.   Eyes: Negative.   Respiratory: Negative.   Cardiovascular: Negative.   Gastrointestinal: Positive for abdominal pain, heartburn, nausea and vomiting. Negative for blood in stool, constipation, diarrhea and melena.  Musculoskeletal:        Weakness-history of CVA w/residual  Neurological: Positive for headaches.  Psychiatric/Behavioral: Negative for suicidal ideas.   Objective:  BP 121/70 (BP Location: Left Arm, Patient Position: Sitting, Cuff Size: Large)   Pulse 73   Temp 99.5 F (37.5 C) (Oral)   Resp 18   Ht 5\' 6"  (1.676 m)   Wt 204 lb (92.5 kg)   SpO2 98%   BMI 32.93 kg/m   BP/Weight 05/19/2017 04/20/2017 15/06/7614  Systolic BP 073 710 626  Diastolic BP 70 60 74  Wt. (Lbs) 204 200 -  BMI 32.93 32.28 -  Some encounter information is confidential and restricted. Go to Review Flowsheets activity to see all data.   Physical Exam  Constitutional: She is well-developed, well-nourished, and in no distress.  HENT:  Head: Normocephalic and atraumatic.  Right Ear: External ear normal.  Left Ear: External ear normal.  Nose: Nose normal.  Mouth/Throat: Oropharynx is clear and moist.  Eyes: Conjunctivae and EOM are normal. Pupils are equal, round, and reactive to light. Right eye exhibits no discharge. Left eye exhibits no discharge.  Cardiovascular: Normal rate, regular rhythm, normal heart sounds and intact distal pulses.  Pulmonary/Chest: Effort normal and breath sounds normal.  Abdominal: There is tenderness (epigastric).  Musculoskeletal:  3/5 muscle strength LUE/LLE; 4/5 muscle strength RUE/RLL  Skin: Skin is warm and dry.  Psychiatric: Her affect is blunt. Her affect is not inappropriate. She expresses no homicidal and no suicidal ideation. She expresses no suicidal plans and no homicidal plans.  Nursing note and vitals reviewed.  Assessment & Plan:   1. Gastroesophageal reflux disease with esophagitis - H. pylori breath test - Ambulatory referral to Gastroenterology - pantoprazole (PROTONIX) 20 MG tablet; Take 1 tablet (20 mg total) by mouth daily.  Dispense: 30 tablet; Refill: 5  2. Dysphagia, unspecified type - Ambulatory referral to Gastroenterology  3. Essential hypertension - lisinopril  (PRINIVIL,ZESTRIL) 40 MG tablet; Take 1 tablet (40 mg total) by mouth daily.  Dispense: 90 tablet; Refill: 1 - amLODipine (NORVASC) 10 MG tablet; Take 1 tablet (10 mg total) by mouth daily.  Dispense: 90 tablet; Refill: 1 - hydrALAZINE (APRESOLINE) 50 MG tablet; TAKE 1 TABLET BY MOUTH 3 TIMES DAILY WITH MEALS.  Dispense: 90 tablet; Refill: 5 - metoprolol tartrate (LOPRESSOR) 50 MG tablet; Take 1 tablet (50 mg total) by mouth 2 (two) times daily.  Dispense: 60 tablet; Refill: 5 - spironolactone (ALDACTONE) 25 MG tablet; Take 1 tablet (25 mg total) by mouth daily.  Dispense: 30 tablet; Refill: 5  4. Chronic migraine - topiramate (TOPAMAX) 50 MG tablet; Take 1 tablet (50 mg total) by mouth daily.  Dispense: 90 tablet; Refill: 1   Follow-up: Return in about 3 months (around 08/17/2017), or if symptoms worsen or fail to improve, for GERD/HTN.   [name]

## 2017-05-20 ENCOUNTER — Encounter: Payer: Self-pay | Admitting: Gastroenterology

## 2017-05-20 MED FILL — ONDANSETRON HCL 4 MG TABLET: 4 | 6 days supply | Qty: 20 | Fill #0

## 2017-05-21 ENCOUNTER — Other Ambulatory Visit: Payer: Self-pay | Admitting: Family Medicine

## 2017-05-21 LAB — H. PYLORI BREATH TEST: H pylori Breath Test: NEGATIVE

## 2017-05-27 ENCOUNTER — Telehealth: Payer: Self-pay

## 2017-05-27 NOTE — Telephone Encounter (Signed)
Pt was called and informed of lab results. 

## 2017-06-09 DIAGNOSIS — I119 Hypertensive heart disease without heart failure: Secondary | ICD-10-CM | POA: Diagnosis not present

## 2017-06-09 DIAGNOSIS — I351 Nonrheumatic aortic (valve) insufficiency: Secondary | ICD-10-CM | POA: Diagnosis not present

## 2017-06-23 ENCOUNTER — Emergency Department (HOSPITAL_BASED_OUTPATIENT_CLINIC_OR_DEPARTMENT_OTHER): Payer: Medicare Other

## 2017-06-23 ENCOUNTER — Emergency Department (HOSPITAL_BASED_OUTPATIENT_CLINIC_OR_DEPARTMENT_OTHER)
Admission: EM | Admit: 2017-06-23 | Discharge: 2017-06-23 | Disposition: A | Discharge status: Home / Self Care | Payer: Medicare Other | Attending: Emergency Medicine | Admitting: Emergency Medicine

## 2017-06-23 ENCOUNTER — Other Ambulatory Visit: Payer: Self-pay

## 2017-06-23 ENCOUNTER — Encounter (HOSPITAL_BASED_OUTPATIENT_CLINIC_OR_DEPARTMENT_OTHER): Payer: Self-pay

## 2017-06-23 DIAGNOSIS — M25561 Pain in right knee: Secondary | ICD-10-CM | POA: Diagnosis not present

## 2017-06-23 DIAGNOSIS — M1711 Unilateral primary osteoarthritis, right knee: Secondary | ICD-10-CM | POA: Diagnosis not present

## 2017-06-23 DIAGNOSIS — I10 Essential (primary) hypertension: Secondary | ICD-10-CM | POA: Diagnosis not present

## 2017-06-23 DIAGNOSIS — Z8673 Personal history of transient ischemic attack (TIA), and cerebral infarction without residual deficits: Secondary | ICD-10-CM | POA: Insufficient documentation

## 2017-06-23 DIAGNOSIS — F17210 Nicotine dependence, cigarettes, uncomplicated: Secondary | ICD-10-CM | POA: Insufficient documentation

## 2017-06-23 DIAGNOSIS — Z79899 Other long term (current) drug therapy: Secondary | ICD-10-CM | POA: Diagnosis not present

## 2017-06-23 DIAGNOSIS — Z7902 Long term (current) use of antithrombotics/antiplatelets: Secondary | ICD-10-CM | POA: Insufficient documentation

## 2017-06-23 MED ORDER — IBUPROFEN 600 MG PO TABS: 600.0000 mg | ORAL_TABLET | Freq: Three times a day (TID) | ORAL | 0 refills | Status: DC | PRN

## 2017-06-23 NOTE — ED Provider Notes (Signed)
[place] EMERGENCY DEPARTMENT Provider Note   CSN: [id number] Arrival date & time: 06/23/17  1626     History   Chief Complaint Chief Complaint  Patient presents with  . Knee Pain    HPI Jayln Branscom is a 47 y.o. female.  Patient with a history of CVA with residual left side weakness. She uses a walker for ambulation. Recently increased activity with ascending and descending stairs. No known injury.    Knee Pain   This is a new problem. The current episode started more than 1 week ago. The problem occurs constantly. The problem has been gradually worsening. The pain is present in the right knee. The quality of the pain is described as aching, pounding and constant. The pain is moderate. Associated symptoms include stiffness. The symptoms are aggravated by activity. She has tried nothing for the symptoms. There has been no history of extremity trauma.    Past Medical History:  Diagnosis Date  . Headache   . Hyperlipidemia   . Hypertension   . Stroke [facility])     Patient Active Problem List   Diagnosis Date Noted  . CVA (cerebral vascular accident) ([place]) 10/25/2016  . Post-menopausal bleeding 09/24/2016  . OSA (obstructive sleep apnea) 09/03/2015  . Heart valve vegetation 09/03/2015  . Snoring 05/02/2015  . Essential hypertension 05/02/2015  . Tobacco use disorder 05/02/2015  . Headache 05/01/2015  . Dysphagia, post-stroke 02/20/2015  . Former smoker 01/24/2015  . Alteration of sensations, post-stroke 12/22/2014  . Cognitive deficit, post-stroke 11/23/2014  . Left hemiparesis ([place]) 10/23/2014  . Cardiomyopathy due to hypertension ([place]) 10/23/2014  . Chronic ischemic vertebrobasilar artery thalamic stroke   . Aortic valve vegetation   . Hypertensive heart disease   . Overweight (BMI 25.0-29.9)   . H/O noncompliance with medical treatment, presenting hazards to health   . Aortic regurgitation   . Cerebral infarction due to thrombosis of right middle  cerebral artery ([place])   . Hyperlipidemia   . Embolic stroke involving right middle cerebral artery ([place]) 10/06/2014    Past Surgical History:  Procedure Laterality Date  . CESAREAN SECTION    . GANGLION CYST EXCISION    . TEE WITHOUT CARDIOVERSION N/A 10/09/2014   Procedure: TRANSESOPHAGEAL ECHOCARDIOGRAM (TEE);  Surgeon: Sanda Klein, MD;  Location: [facility] ENDOSCOPY;  Service: Cardiovascular;  Laterality: N/A;  . TONSILLECTOMY      OB History    No data available       Home Medications    Prior to Admission medications   Medication Sig Start Date End Date Taking? Authorizing Provider  amLODipine (NORVASC) 10 MG tablet Take 1 tablet (10 mg total) by mouth daily. 05/19/17   [name], FNP  atorvastatin (LIPITOR) 80 MG tablet Take 1 tablet (80 mg total) by mouth daily at 6 PM. 11/10/16   Hairston, Maylon Peppers, FNP  butalbital-acetaminophen-caffeine (FIORICET WITH CODEINE) 50-325-40-30 MG capsule Take 1 capsule by mouth every 6 (six) hours as needed.    [provider]  clopidogrel (PLAVIX) 75 MG tablet Take 1 tablet (75 mg total) by mouth daily. 02/23/17   [name], FNP  gabapentin (NEURONTIN) 300 MG capsule Take 1 capsule (300 mg total) by mouth 2 (two) times daily. 02/23/17   [name], FNP  hydrALAZINE (APRESOLINE) 50 MG tablet TAKE 1 TABLET BY MOUTH 3 TIMES DAILY WITH MEALS. 05/19/17   [name], FNP  lisinopril (PRINIVIL,ZESTRIL) 40 MG tablet Take 1 tablet (40 mg total) by mouth  daily. 05/19/17   [name], FNP  metoprolol tartrate (LOPRESSOR) 50 MG tablet Take 1 tablet (50 mg total) by mouth 2 (two) times daily. 05/19/17   [name], FNP  nicotine (NICODERM CQ - DOSED IN MG/24 HOURS) 21 mg/24hr patch Place 1 patch (21 mg total) onto the skin daily. 02/23/17   Hairston, Maylon Peppers, FNP  ondansetron (ZOFRAN) 4 MG tablet TAKE 1 TABLET (4 MG TOTAL) BY MOUTH EVERY 8 (EIGHT) HOURS AS NEEDED FOR NAUSEA OR VOMITING.  05/20/17   [name], FNP  pantoprazole (PROTONIX) 20 MG tablet Take 1 tablet (20 mg total) by mouth daily. 05/19/17   [name], FNP  spironolactone (ALDACTONE) 25 MG tablet Take 1 tablet (25 mg total) by mouth daily. 05/19/17   [name], FNP  topiramate (TOPAMAX) 50 MG tablet Take 1 tablet (50 mg total) by mouth daily. 05/19/17   [name], FNP  traMADol (ULTRAM) 50 MG tablet Take 1 tablet (50 mg total) by mouth every 8 (eight) hours as needed for severe pain. 10/31/16   [name], FNP  venlafaxine XR (EFFEXOR-XR) 150 MG 24 hr capsule Take 1 capsule (150 mg total) daily with breakfast by mouth. 04/20/17   Pieter Partridge, DO    Family History Family History  Problem Relation Age of Onset  . Cancer Mother   . Hypertension Mother   . Hypertension Sister     Social History Social History   Tobacco Use  . Smoking status: Current Every Day Smoker    Packs/day: 0.25  . Smokeless tobacco: Never Used  Substance Use Topics  . Alcohol use: No    Alcohol/week: 0.0 oz  . Drug use: Yes    Types: Marijuana     Allergies   Tylenol [acetaminophen]   Review of Systems Review of Systems  Musculoskeletal: Positive for arthralgias and stiffness.  All other systems reviewed and are negative.    Physical Exam Updated Vital Signs BP 121/72 (BP Location: Right Arm)   Pulse 65   Temp 98.2 F (36.8 C) (Oral)   Resp 20   Ht 5\' 6"  (1.676 m)   Wt 93 kg (205 lb)   SpO2 100%   BMI 33.09 kg/m   Physical Exam  Constitutional: She is oriented to person, place, and time. She appears well-developed and well-nourished.  HENT:  Head: Normocephalic.  Eyes: Conjunctivae are normal.  Neck: Neck supple.  Cardiovascular: Normal rate and regular rhythm.  Pulmonary/Chest: Effort normal and breath sounds normal.  Abdominal: Soft. Bowel sounds are normal.  Musculoskeletal: She exhibits tenderness. She exhibits no deformity.       Right knee: She  exhibits no deformity. Tenderness found. Patellar tendon tenderness noted.  Neurological: She is alert and oriented to person, place, and time.  Skin: Skin is warm and dry.  Psychiatric: She has a normal mood and affect.  Nursing note and vitals reviewed.    ED Treatments / Results  Labs (all labs ordered are listed, but only abnormal results are displayed) Labs Reviewed - No data to display  EKG  EKG Interpretation None       Radiology Dg Knee Complete 4 Views Right  Result Date: 06/23/2017 CLINICAL DATA:  Right knee pain and swelling. EXAM: RIGHT KNEE - COMPLETE 4+ VIEW COMPARISON:  None. FINDINGS: No evidence of fracture, dislocation, or joint effusion. There is mild sharpening the tibial spines. Tricompartment joint space narrowing and marginal spur formation noted. Soft tissues are unremarkable.  IMPRESSION: 1. No acute findings. 2. Tricompartment osteoarthritis. Electronically Signed   By: Kerby Moors M.D.   On: 06/23/2017 19:30    Procedures Procedures (including critical care time)  Medications Ordered in ED Medications - No data to display   Initial Impression / Assessment and Plan / ED Course  I have reviewed the triage vital signs and the nursing notes.  Pertinent labs & imaging results that were available during my care of the patient were reviewed by me and considered in my medical decision making (see chart for details).     Patient X-Ray negative for obvious fracture or dislocation.  Pt advised to follow up with PCP. Patient given knee sleeve while in ED, conservative therapy recommended and discussed. Patient will be discharged home & is agreeable with above plan. Returns precautions discussed. Pt appears safe for discharge.  Final Clinical Impressions(s) / ED Diagnoses   Final diagnoses:  Right knee pain, unspecified chronicity  Osteoarthritis of right knee, unspecified osteoarthritis type    ED Discharge Orders        Ordered    ibuprofen  (ADVIL,MOTRIN) 600 MG tablet  Every 8 hours PRN     06/23/17 1946       Etta Quill, NP 06/23/17 1948    Tanna Furry, MD 06/23/17 [phone]

## 2017-06-23 NOTE — ED Triage Notes (Signed)
C/o right knee pain x 1 month-denies injury-NAD-walking with own walker

## 2017-06-23 NOTE — ED Notes (Signed)
Pt sts EDP said he was going to order an XR; no orders at this time; RN to confirm with EDP.

## 2017-06-29 ENCOUNTER — Ambulatory Visit: Payer: Medicare Other | Attending: Family Medicine | Admitting: Licensed Clinical Social Worker

## 2017-06-29 ENCOUNTER — Encounter: Payer: Self-pay | Admitting: Family Medicine

## 2017-06-29 ENCOUNTER — Ambulatory Visit: Payer: Medicare Other | Attending: Family Medicine | Admitting: Family Medicine

## 2017-06-29 ENCOUNTER — Other Ambulatory Visit: Payer: Self-pay

## 2017-06-29 VITALS — BP 120/69 | HR 64 | Temp 97.9°F | Resp 12 | Wt 210.6 lb

## 2017-06-29 DIAGNOSIS — Z79899 Other long term (current) drug therapy: Secondary | ICD-10-CM | POA: Diagnosis not present

## 2017-06-29 DIAGNOSIS — Z7902 Long term (current) use of antithrombotics/antiplatelets: Secondary | ICD-10-CM | POA: Insufficient documentation

## 2017-06-29 DIAGNOSIS — Z8673 Personal history of transient ischemic attack (TIA), and cerebral infarction without residual deficits: Secondary | ICD-10-CM

## 2017-06-29 DIAGNOSIS — F4323 Adjustment disorder with mixed anxiety and depressed mood: Secondary | ICD-10-CM

## 2017-06-29 DIAGNOSIS — M1711 Unilateral primary osteoarthritis, right knee: Secondary | ICD-10-CM

## 2017-06-29 DIAGNOSIS — M25561 Pain in right knee: Secondary | ICD-10-CM | POA: Diagnosis not present

## 2017-06-29 MED ORDER — PREDNISONE 10 MG PO TABS: ORAL_TABLET | ORAL | 0 refills | Status: DC

## 2017-06-29 MED ORDER — DICLOFENAC SODIUM 1 % TD GEL: 2.0000 g | Freq: Three times a day (TID) | TRANSDERMAL | 1 refills | Status: DC | PRN

## 2017-06-29 MED ORDER — CLOPIDOGREL BISULFATE 75 MG PO TABS: 75.0000 mg | ORAL_TABLET | Freq: Every day | ORAL | 3 refills | Status: DC

## 2017-06-29 MED FILL — CLOPIDOGREL 75 MG TABLET: 75 | 30 days supply | Qty: 30 | Fill #0

## 2017-06-29 NOTE — Progress Notes (Signed)
Subjective:  Patient ID: Brandy Roberts, female    DOB: April 23, 1971  Age: 47 y.o. MRN: [id number]  CC: Knee Pain   HPI [name] presents for complaints of knee pain. Onset a few weeks ago. Location to right knee. Pain 10/10 at worst. Associated symptoms included decreased ROM, tenderness, and swelling. Symptoms aggravated by standing and walking. History of CVA with residual she ambulates with the assistance of a cane. Recent history of ED visit on 06/23/17 for same symptoms. Previous workup included xray that showed tri-compartment joint space narrowing and marginal spur formation related to osteoarthritis. No joint effusion present.    Outpatient Medications Prior to Visit  Medication Sig Dispense Refill  . amLODipine (NORVASC) 10 MG tablet Take 1 tablet (10 mg total) by mouth daily. 90 tablet 1  . atorvastatin (LIPITOR) 80 MG tablet Take 1 tablet (80 mg total) by mouth daily at 6 PM. 90 tablet 3  . butalbital-acetaminophen-caffeine (FIORICET WITH CODEINE) 50-325-40-30 MG capsule Take 1 capsule by mouth every 6 (six) hours as needed.    [name] lisinopril (PRINIVIL,ZESTRIL) 40 MG tablet Take 1 tablet (40 mg total) by mouth daily. 90 tablet 1  . metoprolol tartrate (LOPRESSOR) 50 MG tablet Take 1 tablet (50 mg total) by mouth 2 (two) times daily. 60 tablet 5  . ondansetron (ZOFRAN) 4 MG tablet TAKE 1 TABLET (4 MG TOTAL) BY MOUTH EVERY 8 (EIGHT) HOURS AS NEEDED FOR NAUSEA OR VOMITING. 20 tablet 0  . pantoprazole (PROTONIX) 20 MG tablet Take 1 tablet (20 mg total) by mouth daily. 30 tablet 5  . spironolactone (ALDACTONE) 25 MG tablet Take 1 tablet (25 mg total) by mouth daily. 30 tablet 5  . topiramate (TOPAMAX) 50 MG tablet Take 1 tablet (50 mg total) by mouth daily. 90 tablet 1  . traMADol (ULTRAM) 50 MG tablet Take 1 tablet (50 mg total) by mouth every 8 (eight) hours as needed for severe pain. 30 tablet 0  . venlafaxine XR (EFFEXOR-XR) 150 MG 24 hr capsule Take 1 capsule (150 mg total)  daily with breakfast by mouth. 30 capsule 5  . gabapentin (NEURONTIN) 300 MG capsule Take 1 capsule (300 mg total) by mouth 2 (two) times daily. 60 capsule 2  . hydrALAZINE (APRESOLINE) 50 MG tablet TAKE 1 TABLET BY MOUTH 3 TIMES DAILY WITH MEALS. 90 tablet 5  . ibuprofen (ADVIL,MOTRIN) 600 MG tablet Take 1 tablet (600 mg total) by mouth every 8 (eight) hours as needed. 20 tablet 0  . nicotine (NICODERM CQ - DOSED IN MG/24 HOURS) 21 mg/24hr patch Place 1 patch (21 mg total) onto the skin daily. 21 patch 2  . clopidogrel (PLAVIX) 75 MG tablet Take 1 tablet (75 mg total) by mouth daily. (Patient not taking: Reported on 06/29/2017) 30 tablet 2   No facility-administered medications prior to visit.     ROS Review of Systems  Constitutional: Negative.   Respiratory: Negative.   Cardiovascular: Negative.   Musculoskeletal: Positive for arthralgias and joint swelling.  Skin: Negative.     Objective:  BP 120/69 (BP Location: Right Arm, Patient Position: Sitting, Cuff Size: Large)   Pulse 64   Temp 97.9 F (36.6 C) (Oral)   Resp 12   Wt 210 lb 9.6 oz (95.5 kg)   SpO2 100%   BMI 33.99 kg/m   BP/Weight 06/29/2017 06/23/2017 24/26/8341  Systolic BP 962 229 798  Diastolic BP 69 72 70  Wt. (Lbs) 210.6 205 204  BMI 33.99 33.09 32.93  Some encounter  information is confidential and restricted. Go to Review Flowsheets activity to see all data.     Physical Exam  Constitutional: She appears well-developed and well-nourished.  Cardiovascular: Normal rate, regular rhythm, normal heart sounds and intact distal pulses.  Pulmonary/Chest: Effort normal and breath sounds normal.  Musculoskeletal:       Right knee: She exhibits decreased range of motion and swelling. She exhibits no effusion, no ecchymosis and no erythema. Tenderness found. Medial joint line tenderness noted.  Skin: Skin is warm and dry.  Nursing note and vitals reviewed.    Assessment & Plan:   1. History of CVA  (cerebrovascular accident) without residual deficits  - clopidogrel (PLAVIX) 75 MG tablet; Take 1 tablet (75 mg total) by mouth daily.  Dispense: 90 tablet; Refill: 3  2. Primary osteoarthritis of right knee  - predniSONE (DELTASONE) 10 MG tablet; DAY 1: TAKE 6 TABLETS BY MOUTH WITH MEAL; DAY 2: TAKE 5 TABLETS; DAY 3: TAKE 4 TABLETS; DAY 4: TAKE 3 TABLETS; DAY 5: TAKE 2 TABLETS; DAY 6: TAKE 1 TABLET.  Dispense: 21 tablet; Refill: 0 - diclofenac sodium (VOLTAREN) 1 % GEL; Apply 2 g topically 3 (three) times daily as needed (knee pain).  Dispense: 100 g; Refill: 1 - Ambulatory referral to Orthopedics  3. Medial joint line tenderness of knee, right  - Ambulatory referral to Orthopedics   Follow-up: Return if symptoms worsen or fail to improve.   [name]

## 2017-06-29 NOTE — Patient Instructions (Signed)

## 2017-06-29 NOTE — Progress Notes (Signed)
BLE pain Right knee- 10/10 Off blood thinners x 3 mos.

## 2017-06-29 NOTE — BH Specialist Note (Signed)
Integrated Behavioral Health Initial Visit  MRN: [id number] Name: Brandy Roberts   Session Start time: 10:30 AM Session End time: 10:45 AM Total time: 15 minutes  Type of Service: [place] Interpretor:No. Interpretor Name and Language: N/A   Warm Hand Off Completed.       SUBJECTIVE: Brandy Roberts is a 47 y.o. female accompanied by self. Patient was referred by FNP Hairston for depression. Patient reports the following symptoms/concerns: feelings of sadness, worry, and irritability Duration of problem: Ongoing; Severity of problem: moderate  OBJECTIVE: Mood: Anxious and Affect: Appropriate Risk of harm to self or others: No plan to harm self or others   LIFE CONTEXT: Family and Social: Pt resides with family. She stated that she is wanting to obtain independence housing  School/Work: Pt receives disability [phone])  Self-Care: Pt smokes marijuana occassionally Life Changes: Pt reports conflicting feelings about recent diagnosis of arthritis in her right knee. She is irritable with family and is interested in obtaining independent housing.  GOALS ADDRESSED: Patient will reduce symptoms of: agitation, anxiety and depression and increase knowledge and/or ability of: coping skills and healthy habits and also: Increase healthy adjustment to current life circumstances and Increase adequate support systems for patient/family   INTERVENTIONS: Solution-Focused Strategies, Supportive Counseling, Psychoeducation and/or Health Education and Link to [employer]  Standardized Assessments completed: PHQ 2&9 with C-SSRS  ASSESSMENT: Patient currently experiencing depression, anxiety, and agitation triggered by ongoing medical concerns and dependence on family. She reports feelings of sadness, worry, and irritability. Patient denies intent or plan to harm self or others.   Patient may benefit from psychoeducation and community resources.  [place] educated pt on how chronic pain can negatively impact one's physical and mental health. LCSWA validated pt's feelings of frustration with managing health and provided encouragement. Pt was informed of various therapeutic strategies to assist in the coping of chronic pain.  Pt participates in medication management through Neurologist. LCSWA provided pt with community resources to assist pt in obtaining independent housing. Pt appreciative for assistance.  PLAN: 1. Follow up with behavioral health clinician on : Pt was encouraged to contact LCSWA if symptoms worsen or fail to improve to schedule behavioral appointments at [facility]. 2. Behavioral recommendations: LCSWA recommends that pt apply healthy coping skills discussed, comply with medication management, and utilize provided resources. Pt is encouraged to schedule follow up appointment with LCSWA 3. Referral(s): [employer] Resources:  Housing 4. "From scale of 1-10, how likely are you to follow plan?": 10/10  Rebekah Chesterfield, LCSW 06/30/17 12:10 PM

## 2017-07-06 ENCOUNTER — Encounter: Payer: Self-pay | Admitting: Gastroenterology

## 2017-07-06 ENCOUNTER — Ambulatory Visit (INDEPENDENT_AMBULATORY_CARE_PROVIDER_SITE_OTHER): Payer: Medicare Other | Admitting: Gastroenterology

## 2017-07-06 VITALS — BP 130/62 | HR 72 | Ht 64.0 in | Wt 213.1 lb

## 2017-07-06 DIAGNOSIS — R1314 Dysphagia, pharyngoesophageal phase: Secondary | ICD-10-CM | POA: Diagnosis not present

## 2017-07-06 DIAGNOSIS — R110 Nausea: Secondary | ICD-10-CM

## 2017-07-06 DIAGNOSIS — K219 Gastro-esophageal reflux disease without esophagitis: Secondary | ICD-10-CM | POA: Diagnosis not present

## 2017-07-06 NOTE — Patient Instructions (Addendum)
Please start taking the PANTOPRAZOLE with supper.    If you are age 48 or older, your body mass index should be between 23-30. Your Body mass index is 36.58 kg/m. If this is out of the aforementioned range listed, please consider follow up with your Primary Care Provider.  If you are age 48 or younger, your body mass index should be between 19-25. Your Body mass index is 36.58 kg/m. If this is out of the aformentioned range listed, please consider follow up with your Primary Care Provider.   It has been recommended to you by your physician that you have a(n) EGD completed. Per your request, we did not schedule the procedure(s) today. Please contact our office at [phone] should you decide to have the procedure completed.   Gastroesophageal Reflux Disease, Adult Normally, food travels down the esophagus and stays in the stomach to be digested. If a person has gastroesophageal reflux disease (GERD), food and stomach acid move back up into the esophagus. When this happens, the esophagus becomes sore and swollen (inflamed). Over time, GERD can make small holes (ulcers) in the lining of the esophagus. Follow these instructions at home: Diet  Follow a diet as told by your doctor. You may need to avoid foods and drinks such as: ? Coffee and tea (with or without caffeine). ? Drinks that contain alcohol. ? Energy drinks and sports drinks. ? Carbonated drinks or sodas. ? Chocolate and cocoa. ? Peppermint and mint flavorings. ? Garlic and onions. ? Horseradish. ? Spicy and acidic foods, such as peppers, chili powder, curry powder, vinegar, hot sauces, and BBQ sauce. ? Citrus fruit juices and citrus fruits, such as oranges, lemons, and limes. ? Tomato-based foods, such as red sauce, chili, salsa, and pizza with red sauce. ? Fried and fatty foods, such as donuts, french fries, potato chips, and high-fat dressings. ? High-fat meats, such as hot dogs, rib eye steak, sausage, ham, and  bacon. ? High-fat dairy items, such as whole milk, butter, and cream cheese.  Eat small meals often. Avoid eating large meals.  Avoid drinking large amounts of liquid with your meals.  Avoid eating meals during the 2-3 hours before bedtime.  Avoid lying down right after you eat.  Do not exercise right after you eat. General instructions  Pay attention to any changes in your symptoms.  Take over-the-counter and prescription medicines only as told by your doctor. Do not take aspirin, ibuprofen, or other NSAIDs unless your doctor says it is okay.  Do not use any tobacco products, including cigarettes, chewing tobacco, and e-cigarettes. If you need help quitting, ask your doctor.  Wear loose clothes. Do not wear anything tight around your waist.  Raise (elevate) the head of your bed about 6 inches (15 cm).  Try to lower your stress. If you need help doing this, ask your doctor.  If you are overweight, lose an amount of weight that is healthy for you. Ask your doctor about a safe weight loss goal.  Keep all follow-up visits as told by your doctor. This is important. Contact a doctor if:  You have new symptoms.  You lose weight and you do not know why it is happening.  You have trouble swallowing, or it hurts to swallow.  You have wheezing or a cough that keeps happening.  Your symptoms do not get better with treatment.  You have a hoarse voice. Get help right away if:  You have pain in your arms, neck, jaw, teeth, or back.  You feel sweaty, dizzy, or light-headed.  You have chest pain or shortness of breath.  You throw up (vomit) and your throw up looks like blood or coffee grounds.  You pass out (faint).  Your poop (stool) is bloody or black.  You cannot swallow, drink, or eat. This information is not intended to replace advice given to you by your health care provider. Make sure you discuss any questions you have with your health care provider. Document  Released: 11/05/2007 Document Revised: 10/25/2015 Document Reviewed: 09/13/2014 Elsevier Interactive Patient Education  2018 [place] for Gastroesophageal Reflux Disease, Adult When you have gastroesophageal reflux disease (GERD), the foods you eat and your eating habits are very important. Choosing the right foods can help ease your discomfort. What guidelines do I need to follow?  Choose fruits, vegetables, whole grains, and low-fat dairy products.  Choose low-fat meat, fish, and poultry.  Limit fats such as oils, salad dressings, butter, nuts, and avocado.  Keep a food diary. This helps you identify foods that cause symptoms.  Avoid foods that cause symptoms. These may be different for everyone.  Eat small meals often instead of 3 large meals a day.  Eat your meals slowly, in a place where you are relaxed.  Limit fried foods.  Cook foods using methods other than frying.  Avoid drinking alcohol.  Avoid drinking large amounts of liquids with your meals.  Avoid bending over or lying down until 2-3 hours after eating. What foods are not recommended? These are some foods and drinks that may make your symptoms worse: Vegetables Tomatoes. Tomato juice. Tomato and spaghetti sauce. Chili peppers. Onion and garlic. Horseradish. Fruits Oranges, grapefruit, and lemon (fruit and juice). Meats High-fat meats, fish, and poultry. This includes hot dogs, ribs, ham, sausage, salami, and bacon. Dairy Whole milk and chocolate milk. Sour cream. Cream. Butter. Ice cream. Cream cheese. Drinks Coffee and tea. Bubbly (carbonated) drinks or energy drinks. Condiments Hot sauce. Barbecue sauce. Sweets/Desserts Chocolate and cocoa. Donuts. Peppermint and spearmint. Fats and Oils High-fat foods. This includes [place] fries and potato chips. Other Vinegar. Strong spices. This includes black pepper, white pepper, red pepper, cayenne, curry powder, cloves, ginger, and chili  powder. The items listed above may not be a complete list of foods and drinks to avoid. Contact your dietitian for more information. This information is not intended to replace advice given to you by your health care provider. Make sure you discuss any questions you have with your health care provider. Document Released: 11/18/2011 Document Revised: 10/25/2015 Document Reviewed: 03/23/2013 Elsevier Interactive Patient Education  2017 [place].   Thank you for choosing [facility]  Dr Wilfrid Lund III

## 2017-07-06 NOTE — Progress Notes (Signed)
[facility] Consult Note:  History: Brandy Roberts 07/06/2017  Referring physician: [name], FNP  Reason for consult/chief complaint: Gastroesophageal Reflux (regurgitation, can't eat certain things without them coming back up, heartburn) and Emesis   Subjective  HPI:  This is a 47 year old woman referred by primary care noted above for upper digestive symptoms.  She reports a few years of reflux symptoms with regurgitation and pyrosis, mostly triggered by certain foods.  She has avoided eating salad or bacon, and with those food triggers her symptoms are much improved.  However, she is still bothered by frequent nocturnal heartburn.  She feels dysphagia with some solids and pills feeling stuck in the neck.  This is been going on ever since her CVA in May 2016.  Unfortunately, she still continues to smoke, even if it is just 1 cigarette on some days.  She was in the [facility] ED and [place] last November for a brief episode of upper abdominal pain with nausea and vomiting.  She was given Protonix and Zofran to control symptoms, no imaging performed. The Protonix seems to have decreased the heartburn to some extent, but she is still mostly bothered by nighttime symptoms.  I discussed typical diet and lifestyle antireflux measures, and that seems to all be new information for her. She was also not aware that smoking would make this problem worse.  Lastly, she will have occasional nausea and vomiting without clear triggers.  Sometimes it happens during heartburn episodes, other times when she is just very hungry.  ROS:  Review of Systems  Constitutional: Negative for appetite change and unexpected weight change.  HENT: Negative for mouth sores and voice change.   Eyes: Negative for pain and redness.  Respiratory: Negative for cough and shortness of breath.   Cardiovascular: Negative for chest pain and palpitations.  Genitourinary: Negative for  dysuria and hematuria.  Musculoskeletal: Positive for arthralgias. Negative for myalgias.  Skin: Negative for pallor and rash.  Neurological: Positive for weakness. Negative for headaches.  Hematological: Negative for adenopathy.   Chronic left-sided weakness since her CVA Chronic right knee   pain   Past Medical History: Past Medical History:  Diagnosis Date  . Anemia   . Anxiety   . Depression   . Hyperlipidemia   . Hypertension   . Migraines   . Osteoarthritis   . Sleep apnea   . Stroke [facility])    CVA May 2016  Past Surgical History: Past Surgical History:  Procedure Laterality Date  . CESAREAN SECTION    . GANGLION CYST EXCISION    . TEE WITHOUT CARDIOVERSION N/A 10/09/2014   Procedure: TRANSESOPHAGEAL ECHOCARDIOGRAM (TEE);  Surgeon: Sanda Klein, MD;  Location: [facility] ENDOSCOPY;  Service: Cardiovascular;  Laterality: N/A;  . TONSILLECTOMY       Family History: Family History  Problem Relation Age of Onset  . Cancer Mother        type unknown  . Hypertension Mother   . Hypertension Sister   . Diabetes Sister   . Cancer Maternal Aunt        4 aunts died of cancer types unknown    Social History: Social History   Socioeconomic History  . Marital status: Single    Spouse name: None  . Number of children: 2  . Years of education: 40  . Highest education level: None  Social Needs  . Financial resource strain: None  . Food insecurity - worry: None  . Food insecurity - inability: None  .  Transportation needs - medical: None  . Transportation needs - non-medical: None  Occupational History  . Occupation: N/A  Tobacco Use  . Smoking status: Current Every Day Smoker    Packs/day: 0.25  . Smokeless tobacco: Never Used  Substance and Sexual Activity  . Alcohol use: No    Alcohol/week: 0.0 oz  . Drug use: Yes    Types: Marijuana  . Sexual activity: None  Other Topics Concern  . None  Social History Narrative   Patient lives with her uncle in a one story  home.  Has 2 sons.  Currently on disability.  Education: high school.   Drinks 1-2 sodas a week    Nearly quit tobacco 2 weeks ago (except sometimes one during day)  Allergies: Allergies  Allergen Reactions  . Tylenol [Acetaminophen] Other (See Comments)    Pt stated tylenol gives her extreme headache    Outpatient Meds: Current Outpatient Medications  Medication Sig Dispense Refill  . amLODipine (NORVASC) 10 MG tablet Take 1 tablet (10 mg total) by mouth daily. 90 tablet 1  . atorvastatin (LIPITOR) 80 MG tablet Take 1 tablet (80 mg total) by mouth daily at 6 PM. 90 tablet 3  . butalbital-acetaminophen-caffeine (FIORICET WITH CODEINE) 50-325-40-30 MG capsule Take 1 capsule by mouth every 6 (six) hours as needed.    . clopidogrel (PLAVIX) 75 MG tablet Take 1 tablet (75 mg total) by mouth daily. 90 tablet 3  . diclofenac sodium (VOLTAREN) 1 % GEL Apply 2 g topically 3 (three) times daily as needed (knee pain). 100 g 1  . gabapentin (NEURONTIN) 300 MG capsule Take 1 capsule (300 mg total) by mouth 2 (two) times daily. 60 capsule 2  . hydrALAZINE (APRESOLINE) 50 MG tablet TAKE 1 TABLET BY MOUTH 3 TIMES DAILY WITH MEALS. 90 tablet 5  . ibuprofen (ADVIL,MOTRIN) 600 MG tablet Take 1 tablet (600 mg total) by mouth every 8 (eight) hours as needed. 20 tablet 0  . lisinopril (PRINIVIL,ZESTRIL) 40 MG tablet Take 1 tablet (40 mg total) by mouth daily. 90 tablet 1  . metoprolol tartrate (LOPRESSOR) 50 MG tablet Take 1 tablet (50 mg total) by mouth 2 (two) times daily. 60 tablet 5  . nicotine (NICODERM CQ - DOSED IN MG/24 HOURS) 21 mg/24hr patch Place 1 patch (21 mg total) onto the skin daily. 21 patch 2  . ondansetron (ZOFRAN) 4 MG tablet TAKE 1 TABLET (4 MG TOTAL) BY MOUTH EVERY 8 (EIGHT) HOURS AS NEEDED FOR NAUSEA OR VOMITING. 20 tablet 0  . pantoprazole (PROTONIX) 20 MG tablet Take 1 tablet (20 mg total) by mouth daily. 30 tablet 5  . spironolactone (ALDACTONE) 25 MG tablet Take 1 tablet (25 mg  total) by mouth daily. 30 tablet 5  . topiramate (TOPAMAX) 50 MG tablet Take 1 tablet (50 mg total) by mouth daily. 90 tablet 1  . traMADol (ULTRAM) 50 MG tablet Take 1 tablet (50 mg total) by mouth every 8 (eight) hours as needed for severe pain. 30 tablet 0  . venlafaxine XR (EFFEXOR-XR) 150 MG 24 hr capsule Take 1 capsule (150 mg total) daily with breakfast by mouth. 30 capsule 5  . predniSONE (DELTASONE) 10 MG tablet DAY 1: TAKE 6 TABLETS BY MOUTH WITH MEAL; DAY 2: TAKE 5 TABLETS; DAY 3: TAKE 4 TABLETS; DAY 4: TAKE 3 TABLETS; DAY 5: TAKE 2 TABLETS; DAY 6: TAKE 1 TABLET. (Patient not taking: Reported on 07/06/2017) 21 tablet 0   No current facility-administered medications for this visit.  Takes PPI in AM ___________________________________________________________________ Objective   Exam:  BP 130/62 (BP Location: Right Arm, Patient Position: Sitting, Cuff Size: Normal)   Pulse 72   Ht _0  (1.626 m) Comment: height measured without shoes  Wt 213 lb 2 oz (96.7 kg)   BMI 36.58 kg/m    General: this is a(n) chronically ill-appearing woman with an antalgic gait, uses a cane on the right side, able to get on exam table slowly but without assistance.  Eyes: sclera anicteric, no redness  ENT: oral mucosa moist without lesions, no cervical or supraclavicular lymphadenopathy, good dentition  CV: RRR without murmur, S1/S2, no JVD, no peripheral edema  Resp: clear to auscultation bilaterally, normal RR and effort noted  GI: soft, no tenderness, with active bowel sounds. No guarding or palpable organomegaly noted.  Skin; warm and dry, no rash or jaundice noted  Neuro: awake, alert and oriented x 3.  Decreased gross motor strength left hand and arm  Labs:  No data for review.  ED encounter noted above indicated negative pregnancy test.  Assessment: Encounter Diagnoses  Name Primary?  . Gastroesophageal reflux disease, esophagitis presence not specified Yes  . Nausea without  vomiting   . Pharyngoesophageal dysphagia     She has a variety of upper digestive symptoms.  It seems mainly to be GERD but also with some dyspepsia and nausea. I reinforced the need for antireflux diet and lifestyle measures, and written materials were given. Since she is mostly having nocturnal heartburn, I gave her written instructions to reduce her pantoprazole to the supper meal.   upper endoscopy was advised as well to evaluate for gastritis, gastric outlet obstruction, ulcer, malignancy, hiatal hernia, erosive esophagitis.  Procedure was described in detail along with risks and benefits.  She was reluctant after describing what sounds like a bad experience with a possible upper endoscopy in the hospital years ago.  It is not clear which she really had done but she believes she was unsedated.  I described the procedure under MAC sedation at length, and she seemed finally agreeable.  When my MA went to schedule it, she had changed her mind and said she would give it further consideration and then call us back.  Thank you for the courtesy of this consult.  Please call me with any questions or concerns.  Nelida Meuse III  CC: [name], FNP

## 2017-07-07 ENCOUNTER — Ambulatory Visit (INDEPENDENT_AMBULATORY_CARE_PROVIDER_SITE_OTHER): Payer: Medicaid Other | Admitting: Orthopaedic Surgery

## 2017-07-07 VITALS — Ht 64.0 in | Wt 213.0 lb

## 2017-07-07 DIAGNOSIS — M25561 Pain in right knee: Secondary | ICD-10-CM | POA: Diagnosis not present

## 2017-07-07 DIAGNOSIS — M1711 Unilateral primary osteoarthritis, right knee: Secondary | ICD-10-CM | POA: Diagnosis not present

## 2017-07-07 MED ORDER — LIDOCAINE HCL 1 % IJ SOLN
2.0000 mL | INTRAMUSCULAR | Status: AC | PRN
  Administered 2017-07-07: 2 mL

## 2017-07-07 MED ORDER — BUPIVACAINE HCL 0.5 % IJ SOLN
2.0000 mL | INTRAMUSCULAR | Status: AC | PRN
  Administered 2017-07-07: 2 mL via INTRA_ARTICULAR

## 2017-07-07 MED ORDER — METHYLPREDNISOLONE ACETATE 40 MG/ML IJ SUSP
40.0000 mg | INTRAMUSCULAR | Status: AC | PRN
  Administered 2017-07-07: 40 mg via INTRA_ARTICULAR

## 2017-07-07 MED FILL — TOPIRAMATE 50 MG TABLET: 50 | 30 days supply | Qty: 30 | Fill #1

## 2017-07-07 MED FILL — VENLAFAXINE HCL ER 150 MG C: 150 | 30 days supply | Qty: 30 | Fill #1

## 2017-07-07 MED FILL — SPIRONOLACTONE 25 MG TABS: 25 | 30 days supply | Qty: 30 | Fill #0

## 2017-07-07 MED FILL — LISINOPRIL 40 MG TAB: 40 | 30 days supply | Qty: 30 | Fill #1

## 2017-07-07 MED FILL — PANTOPRAZOLE SOD DR 20 MG T: 20 | 30 days supply | Qty: 30 | Fill #1

## 2017-07-07 MED FILL — GABAPENTIN 300 MG CAPSULE: 300 | 30 days supply | Qty: 60 | Fill #2

## 2017-07-07 NOTE — Progress Notes (Signed)
Office Visit Note   Patient: Brandy Roberts           Date of Birth: 1970-07-16           MRN: [id number] Visit Date: 07/07/2017              Requested by: [name], [address], [place] [address] PCP: [name], FNP   Assessment & Plan: Visit Diagnoses:  1. Unilateral primary osteoarthritis, right knee     Plan: Impression is 47 year old female with right knee osteoarthritis.  Cortisone injection was performed today.  Patient tolerates well.  Patient instructed to follow-up if not better.  Questions encouraged and answered.  Follow-Up Instructions: Return if symptoms worsen or fail to improve.   Orders:  No orders of the defined types were placed in this encounter.  No orders of the defined types were placed in this encounter.     Procedures: Large Joint Inj: R knee on 07/07/2017 8:49 AM Indications: pain Details: 22 G needle  Arthrogram: No  Medications: 40 mg methylPREDNISolone acetate 40 MG/ML; 2 mL lidocaine 1 %; 2 mL bupivacaine 0.5 % Consent was given by the patient. Patient was prepped and draped in the usual sterile fashion.       Clinical Data: No additional findings.   Subjective: Chief Complaint  Patient presents with  . Right Knee - Pain    Patient is a brand-new 47 year old female comes in with 55-month history of right knee pain.  She is walking with a quad cane mainly due to a history of prior stroke.  She does have left-sided hemiparesis.  She did not take Tylenol or Advil.  Denies any injuries or numbness and tingling.    Review of Systems  Constitutional: Negative.   HENT: Negative.   Eyes: Negative.   Respiratory: Negative.   Cardiovascular: Negative.   Endocrine: Negative.   Musculoskeletal: Negative.   Neurological: Negative.   Hematological: Negative.   Psychiatric/Behavioral: Negative.   All other systems reviewed and are negative.    Objective: Vital Signs: Ht 5\' 4"  (1.626 m)   Wt  213 lb (96.6 kg)   BMI 36.56 kg/m   Physical Exam  Constitutional: She is oriented to person, place, and time. She appears well-developed and well-nourished.  HENT:  Head: Normocephalic and atraumatic.  Eyes: EOM are normal.  Neck: Neck supple.  Pulmonary/Chest: Effort normal.  Abdominal: Soft.  Neurological: She is alert and oriented to person, place, and time.  Skin: Skin is warm. Capillary refill takes less than 2 seconds.  Psychiatric: She has a normal mood and affect. Her behavior is normal. Judgment and thought content normal.  Nursing note and vitals reviewed.   Ortho Exam Right knee exam shows no joint effusion.  Collaterals and cruciates are stable.  Normal range of motion. Specialty Comments:  No specialty comments available.  Imaging: No results found.   PMFS History: Patient Active Problem List   Diagnosis Date Noted  . CVA (cerebral vascular accident) ([place]) 10/25/2016  . Post-menopausal bleeding 09/24/2016  . OSA (obstructive sleep apnea) 09/03/2015  . Heart valve vegetation 09/03/2015  . Snoring 05/02/2015  . Essential hypertension 05/02/2015  . Tobacco use disorder 05/02/2015  . Headache 05/01/2015  . Dysphagia, post-stroke 02/20/2015  . Former smoker 01/24/2015  . Alteration of sensations, post-stroke 12/22/2014  . Cognitive deficit, post-stroke 11/23/2014  . Left hemiparesis ([place]) 10/23/2014  . Cardiomyopathy due to hypertension ([place]) 10/23/2014  . Chronic ischemic vertebrobasilar artery thalamic  stroke   . Aortic valve vegetation   . Hypertensive heart disease   . Overweight (BMI 25.0-29.9)   . H/O noncompliance with medical treatment, presenting hazards to health   . Aortic regurgitation   . Cerebral infarction due to thrombosis of right middle cerebral artery ([place])   . Hyperlipidemia   . Embolic stroke involving right middle cerebral artery ([place]) 10/06/2014   Past Medical History:  Diagnosis Date  . Anemia   . Anxiety   . Depression   .  Hyperlipidemia   . Hypertension   . Migraines   . Osteoarthritis   . Sleep apnea   . Stroke [facility])     Family History  Problem Relation Age of Onset  . Cancer Mother        type unknown  . Hypertension Mother   . Hypertension Sister   . Diabetes Sister   . Cancer Maternal Aunt        4 aunts died of cancer types unknown    Past Surgical History:  Procedure Laterality Date  . CESAREAN SECTION    . GANGLION CYST EXCISION    . TEE WITHOUT CARDIOVERSION N/A 10/09/2014   Procedure: TRANSESOPHAGEAL ECHOCARDIOGRAM (TEE);  Surgeon: Sanda Klein, MD;  Location: [facility] ENDOSCOPY;  Service: Cardiovascular;  Laterality: N/A;  . TONSILLECTOMY     Social History   Occupational History  . Occupation: N/A  Tobacco Use  . Smoking status: Current Every Day Smoker    Packs/day: 0.25  . Smokeless tobacco: Never Used  Substance and Sexual Activity  . Alcohol use: No    Alcohol/week: 0.0 oz  . Drug use: Yes    Types: Marijuana  . Sexual activity: Not on file

## 2017-07-08 MED FILL — CLOPIDOGREL 75 MG TABLET: 75 | 30 days supply | Qty: 30 | Fill #1

## 2017-07-13 ENCOUNTER — Other Ambulatory Visit: Payer: Self-pay | Admitting: Family Medicine

## 2017-07-14 ENCOUNTER — Other Ambulatory Visit: Payer: Self-pay | Admitting: Family Medicine

## 2017-07-23 ENCOUNTER — Telehealth: Payer: Self-pay | Admitting: Family Medicine

## 2017-07-23 NOTE — Telephone Encounter (Signed)
Tammy called to check on paperwork regarding a certificate of medical necessity. Please fu at your earliest convenience. Fax# [phone]

## 2017-07-23 NOTE — Telephone Encounter (Signed)
Home care Delivered fax came in for Disposable Underpads,Pull-ON

## 2017-07-31 NOTE — Telephone Encounter (Signed)
Tammy called again to request the Certificate of medical Necessity be faxed over. Please follow up Phone:[phone] Fax:[phone]

## 2017-08-04 NOTE — Telephone Encounter (Signed)
Re-faxed paperwork (3rd time). States that not all questions are answered.  Sent paper work in with another fax verification received.

## 2017-08-17 ENCOUNTER — Ambulatory Visit: Payer: Medicare Other | Admitting: Family Medicine

## 2017-08-20 ENCOUNTER — Ambulatory Visit: Payer: Medicare Other | Admitting: Family Medicine

## 2017-08-20 MED FILL — TOPIRAMATE 50 MG TABLET: 50 | 30 days supply | Qty: 30 | Fill #2

## 2017-08-20 MED FILL — hydrALAZINE HCL 50 MG TABS: 50 | 30 days supply | Qty: 90 | Fill #1

## 2017-08-20 MED FILL — AMLODIPINE BESYLATE 10 MG T: 10 | 30 days supply | Qty: 30 | Fill #1

## 2017-08-20 MED FILL — METOPROLOL TARTRATE 50 MG T: 50 | 30 days supply | Qty: 60 | Fill #0

## 2017-08-21 MED FILL — LISINOPRIL 40 MG TAB: 40 | 30 days supply | Qty: 30 | Fill #2

## 2017-08-25 MED FILL — ATORVASTATIN 80 MG TABLET: 80 | 90 days supply | Qty: 90 | Fill #0

## 2017-08-25 MED FILL — VENLAFAXINE HCL ER 150 MG C: 150 | 30 days supply | Qty: 30 | Fill #2

## 2017-09-22 ENCOUNTER — Ambulatory Visit: Payer: Medicare Other | Admitting: Nurse Practitioner

## 2017-10-19 ENCOUNTER — Encounter: Payer: Self-pay | Admitting: Neurology

## 2017-10-19 ENCOUNTER — Ambulatory Visit (INDEPENDENT_AMBULATORY_CARE_PROVIDER_SITE_OTHER): Payer: Medicare Other | Admitting: Neurology

## 2017-10-19 VITALS — BP 142/68 | HR 56 | Ht 66.0 in | Wt 198.0 lb

## 2017-10-19 DIAGNOSIS — R51 Headache: Secondary | ICD-10-CM | POA: Diagnosis not present

## 2017-10-19 DIAGNOSIS — R519 Headache, unspecified: Secondary | ICD-10-CM

## 2017-10-19 DIAGNOSIS — F17210 Nicotine dependence, cigarettes, uncomplicated: Secondary | ICD-10-CM | POA: Diagnosis not present

## 2017-10-19 DIAGNOSIS — G43009 Migraine without aura, not intractable, without status migrainosus: Secondary | ICD-10-CM

## 2017-10-19 DIAGNOSIS — I69959 Hemiplegia and hemiparesis following unspecified cerebrovascular disease affecting unspecified side: Secondary | ICD-10-CM | POA: Diagnosis not present

## 2017-10-19 DIAGNOSIS — R296 Repeated falls: Secondary | ICD-10-CM | POA: Diagnosis not present

## 2017-10-19 MED ORDER — PREDNISONE 10 MG (21) PO TBPK: ORAL_TABLET | ORAL | 0 refills | Status: DC

## 2017-10-19 MED FILL — predniSONE 10 MG TABS: 10 | 6 days supply | Qty: 21 | Fill #0

## 2017-10-19 NOTE — Patient Instructions (Addendum)
1.  To help break this cycle of daily headache, I will prescribe you a prednisone taper.  Take 6tabs x1day, then 5tabs x1day, then 4tabs x1day, then 3tabs x1day, then 2tabs x1day, then 1tab x1day, then STOP.  If headaches do not stop in one week, contact me.  2.  Stop the butalbital-acetaminophen-caffeine pills as they will cause more headaches if you keep taking them.  3.  We will check MRI of brain. We have sent a referral to [facility] for your MRI and they will call you directly to schedule your appt. They are located at [place]. If you need to contact them directly please call [phone].  4.  Increase water intake and keep cool. 5.  Follow up in 6 months

## 2017-10-19 NOTE — Progress Notes (Signed)
NEUROLOGY FOLLOW UP OFFICE NOTE  Brandy Roberts [id number]  HISTORY OF PRESENT ILLNESS: Brandy Roberts is a 47 year old right-handed female with HTN, hyperlipidemia, and history of stroke who follows up for stroke and migraine.  She is accompanied by her sister who supplements history.   UPDATE: Migraines continued to be well-controlled until 2 weeks ago.  They have been daily.  They respond quickly with Fioricet but otherwise last all day.  Over the past month, she reports increased problems with balance.  She had 2 falls, one in early April and another in early May.  She loses balance.  They are associated with headache and worsening of her left sided weakness.  She also reports increased nausea and will even vomit in the evenings.  She thinks that the heat is triggering her migraines and causing her other symptoms. Current NSAIDS:  no Current analgesics: Fioricet, tramadol Current triptans:  no Current anti-emetic:  Zofran 4mg  Current muscle relaxants:  no Current anti-anxiolytic:  no Current sleep aide:  no Current Antihypertensive medications:  amlodipine 5mg , hydralazine 50mg  three times daily, spironolactone, Lisinopril Current Antidepressant medications:  venlafaxine XR 150mg  Current Anticonvulsant medications:  topiramate 50mg , gabapentin 300mg  twice daily Current Vitamins/Herbal/Supplements:  no Current Antihistamines/Decongestants:  no Other therapy:  no   Caffeine:  Coffee, sweet tea Alcohol:  no Smoker:  Yes (cigarettes) Diet:  hydrates Exercise:  no Depression/anxiety:  Depression since the stroke.  She reports increased moodiness. Sleep hygiene:  Sometimes tosses and turns   HISTORY:  Onset:  Migraines since she was young, but worse since her stroke in May 2016. Location:  holocephalic Quality:  pounding Initial Intensity:  10/10 Aura:  no Prodrome:  no Postdrome:  no Associated symptoms:  Photophobia, phonophobia.  Rarely nausea.  She has not had any  new worse headache of her life, waking up from sleep Initial Duration:  2 days (but Fioricet and ibuprofen lowers intensity down to 2-3/10) Initial Frequency:  Once or twice a month Frequency of abortive medication: only as needed Triggers/exacerbating factors:  no Relieving factors:  Laying down.  Butalbital, ibuprofen.  Tramadol helped but she was told not to take it. Activity:  aggravates   Past NSAIDS:  Ibuprofen (advised to stop NSAIDs as she is on Plavix) Past analgesics:  Tylenol, Excedrin, Tramadol (effective) Past abortive triptans:  no Past muscle relaxants:  no Past anti-emetic:  no Past antihypertensive medications:  no Past antidepressant medications:  no Past anticonvulsant medications:  no Past vitamins/Herbal/Supplements:  no Other past therapies:  no   Family history of headache:  Sister.   STROKE:  She had a stroke in May 2016 with left sided weakness.  MRI of brain from 10/06/14 was personally reviewed and revealed small acute lacunar infarcts in the right thalamus and right corona radiata, as well as chronic lacunar infarcts in the left hemisphere.  CTA of head and neck revealed diffuse bilateral petrous and cavernous carotid stenosis.  Cardiac source of embolus was not discovered.     She was admitted to [facility] from 10/24/16 to 10/26/16 for stroke-like event.  She suddenly developed left sided weakness and slurred speech with associated headache.  Blood pressure in ED was 120/66.  CT of head was personally reviewed and negative for acute abnormality.  She had refused tPA.  MRI of brain was personally reviewed and revealed no acute stroke or bleed.  MRA of head revealed no significant intracranial stenosis or occlusion.  LDL was 84.  Hgb A1c  was 5.7.  As per neurology evaluation, her deficits appeared to be non-organic, with unusual speech pattern and giveaway weakness.  Somatization was suspected.  2D echo from 11/06/16 demonstrated normal LV EF of 65-70% with no  cardiac source of emboli.  Atorvastatin was increased from 10mg  to 80mg  daily.  Differential includes TIA vs hemiplegic migraine vs somatization.  PAST MEDICAL HISTORY: Past Medical History:  Diagnosis Date  . Anemia   . Anxiety   . Depression   . Hyperlipidemia   . Hypertension   . Migraines   . Osteoarthritis   . Sleep apnea   . Stroke [facility])     MEDICATIONS: Current Outpatient Medications on File Prior to Visit  Medication Sig Dispense Refill  . amLODipine (NORVASC) 10 MG tablet Take 1 tablet (10 mg total) by mouth daily. 90 tablet 1  . atorvastatin (LIPITOR) 80 MG tablet Take 1 tablet (80 mg total) by mouth daily at 6 PM. 90 tablet 3  . butalbital-acetaminophen-caffeine (FIORICET WITH CODEINE) 50-325-40-30 MG capsule Take 1 capsule by mouth every 6 (six) hours as needed.    . clopidogrel (PLAVIX) 75 MG tablet Take 1 tablet (75 mg total) by mouth daily. 90 tablet 3  . diclofenac sodium (VOLTAREN) 1 % GEL Apply 2 g topically 3 (three) times daily as needed (knee pain). 100 g 1  . gabapentin (NEURONTIN) 300 MG capsule Take 1 capsule (300 mg total) by mouth 2 (two) times daily. 60 capsule 2  . hydrALAZINE (APRESOLINE) 50 MG tablet TAKE 1 TABLET BY MOUTH 3 TIMES DAILY WITH MEALS. 90 tablet 5  . ibuprofen (ADVIL,MOTRIN) 600 MG tablet Take 1 tablet (600 mg total) by mouth every 8 (eight) hours as needed. (Patient not taking: Reported on 10/19/2017) 20 tablet 0  . lisinopril (PRINIVIL,ZESTRIL) 40 MG tablet Take 1 tablet (40 mg total) by mouth daily. 90 tablet 1  . metoprolol tartrate (LOPRESSOR) 50 MG tablet Take 1 tablet (50 mg total) by mouth 2 (two) times daily. 60 tablet 5  . nicotine (NICODERM CQ - DOSED IN MG/24 HOURS) 21 mg/24hr patch Place 1 patch (21 mg total) onto the skin daily. 21 patch 2  . ondansetron (ZOFRAN) 4 MG tablet TAKE 1 TABLET (4 MG TOTAL) BY MOUTH EVERY 8 (EIGHT) HOURS AS NEEDED FOR NAUSEA OR VOMITING. 20 tablet 0  . pantoprazole (PROTONIX) 20 MG tablet Take 1 tablet  (20 mg total) by mouth daily. 30 tablet 5  . predniSONE (DELTASONE) 10 MG tablet DAY 1: TAKE 6 TABLETS BY MOUTH WITH MEAL; DAY 2: TAKE 5 TABLETS; DAY 3: TAKE 4 TABLETS; DAY 4: TAKE 3 TABLETS; DAY 5: TAKE 2 TABLETS; DAY 6: TAKE 1 TABLET. 21 tablet 0  . spironolactone (ALDACTONE) 25 MG tablet Take 1 tablet (25 mg total) by mouth daily. 30 tablet 5  . topiramate (TOPAMAX) 50 MG tablet Take 1 tablet (50 mg total) by mouth daily. 90 tablet 1  . traMADol (ULTRAM) 50 MG tablet Take 1 tablet (50 mg total) by mouth every 8 (eight) hours as needed for severe pain. (Patient not taking: Reported on 10/19/2017) 30 tablet 0  . venlafaxine XR (EFFEXOR-XR) 150 MG 24 hr capsule Take 1 capsule (150 mg total) daily with breakfast by mouth. 30 capsule 5   No current facility-administered medications on file prior to visit.     ALLERGIES: Allergies  Allergen Reactions  . Tylenol [Acetaminophen] Other (See Comments)    Pt stated tylenol gives her extreme headache    FAMILY HISTORY: Family  History  Problem Relation Age of Onset  . Cancer Mother        type unknown  . Hypertension Mother   . Hypertension Sister   . Diabetes Sister   . Cancer Maternal Aunt        4 aunts died of cancer types unknown    SOCIAL HISTORY: Social History   Socioeconomic History  . Marital status: Single    Spouse name: Not on file  . Number of children: 2  . Years of education: 62  . Highest education level: Not on file  Occupational History  . Occupation: N/A  Social Needs  . Financial resource strain: Not on file  . Food insecurity:    Worry: Not on file    Inability: Not on file  . Transportation needs:    Medical: Not on file    Non-medical: Not on file  Tobacco Use  . Smoking status: Current Every Day Smoker    Packs/day: 0.25  . Smokeless tobacco: Never Used  Substance and Sexual Activity  . Alcohol use: No    Alcohol/week: 0.0 oz  . Drug use: Yes    Types: Marijuana  . Sexual activity: Not on file    Lifestyle  . Physical activity:    Days per week: Not on file    Minutes per session: Not on file  . Stress: Not on file  Relationships  . Social connections:    Talks on phone: Not on file    Gets together: Not on file    Attends religious service: Not on file    Active member of club or organization: Not on file    Attends meetings of clubs or organizations: Not on file    Relationship status: Not on file  . Intimate partner violence:    Fear of current or ex partner: Not on file    Emotionally abused: Not on file    Physically abused: Not on file    Forced sexual activity: Not on file  Other Topics Concern  . Not on file  Social History Narrative   Patient lives with her uncle in a one story home.  Has 2 sons.  Currently on disability.  Education: high school.   Drinks 1-2 sodas a week     REVIEW OF SYSTEMS: Constitutional: No fevers, chills, or sweats, no generalized fatigue, change in appetite Eyes: No visual changes, double vision, eye pain Ear, nose and throat: No hearing loss, ear pain, nasal congestion, sore throat Cardiovascular: No chest pain, palpitations Respiratory:  No shortness of breath at rest or with exertion, wheezes GastrointestinaI: No nausea, vomiting, diarrhea, abdominal pain, fecal incontinence Genitourinary:  No dysuria, urinary retention or frequency Musculoskeletal:  No neck pain, back pain Integumentary: No rash, pruritus, skin lesions Neurological: as above Psychiatric: No depression, insomnia, anxiety Endocrine: No palpitations, fatigue, diaphoresis, mood swings, change in appetite, change in weight, increased thirst Hematologic/Lymphatic:  No purpura, petechiae. Allergic/Immunologic: no itchy/runny eyes, nasal congestion, recent allergic reactions, rashes  PHYSICAL EXAM: Vitals:   10/19/17 1123  BP: (!) 142/68  Pulse: (!) 56  SpO2: 100%   General: No acute distress.  Patient appears well-groomed.   Head:   Normocephalic/atraumatic Eyes:  Fundi examined but not visualized Neck: supple, no paraspinal tenderness, full range of motion Heart:  Regular rate and rhythm Lungs:  Clear to auscultation bilaterally Back: No paraspinal tenderness Neurological Exam:MS:  alert and oriented to person, place, and time. Attention span and concentration intact, recent and remote  memory intact, fund of knowledge intact.  Speech fluent and not dysarthric, language intact.  CN:   II-XII intact. Muscle:  Bulk and tone normal, muscle strength Motor:  5-/5 left deltoid and bicep, 4+/5 left tricep, hand grip and left lower extremity.  5/5 on right.  Reduced amplitude and speed of finger tapping on left, but arrhythmic.  Sensation: Mildly reduced temperature and vibration sensation intact in left upper and lower extremities.  Deep Tendon Reflexes:  3+ throughout, slightly more brisk on the left.  Finger to nose testing:  questionable dysmetria.  Left hemiparetic gait.   Romberg with sway  IMPRESSION: 1.  Migraine without aura, daily over past 2 weeks 2.  Hemiplegia of left side as late effect of stroke 3.  Increased falls 4.  Tobacco use disorder  PLAN: 1.  To evaluate increased falls/transient worsening of left sided weakness worsening headaches, we will check MRI of brain to evaluate for any new stroke. 2.  Prednisone taper to break current intractable headache.   3.  While headaches are still intractable, advised to stop Fioricet.  If headaches do not abort and an alternative abortive medication is needed, we will prescribe one. 4.  Continue venlafaxine XR 150mg  and topiramate 50mg  daily 5.  Smoking cessation 6.  Increase water intake and keep cool 7.  Limit pain relievers to no more than 2 days out of week 8.  Headache diary 9.  Follow up in 6 months.  Metta Clines, DO  CC:  Fredia Beets, FNP

## 2017-10-20 MED FILL — PANTOPRAZOLE SOD DR 20 MG T: 20 | 30 days supply | Qty: 30 | Fill #2

## 2017-10-20 MED FILL — CLOPIDOGREL 75 MG TABLET: 75 | 30 days supply | Qty: 30 | Fill #2

## 2017-10-20 MED FILL — SPIRONOLACTONE 25 MG TABLET: 25 | 30 days supply | Qty: 30 | Fill #1

## 2017-11-04 ENCOUNTER — Encounter

## 2017-11-04 ENCOUNTER — Ambulatory Visit: Payer: Medicare Other | Admitting: Licensed Clinical Social Worker

## 2017-11-04 ENCOUNTER — Ambulatory Visit: Payer: Medicare Other | Attending: Nurse Practitioner | Admitting: Nurse Practitioner

## 2017-11-04 ENCOUNTER — Encounter: Payer: Self-pay | Admitting: Nurse Practitioner

## 2017-11-04 VITALS — BP 130/71 | HR 57 | Temp 98.5°F | Ht 66.0 in | Wt 200.6 lb

## 2017-11-04 DIAGNOSIS — I429 Cardiomyopathy, unspecified: Secondary | ICD-10-CM | POA: Insufficient documentation

## 2017-11-04 DIAGNOSIS — G4730 Sleep apnea, unspecified: Secondary | ICD-10-CM | POA: Insufficient documentation

## 2017-11-04 DIAGNOSIS — K219 Gastro-esophageal reflux disease without esophagitis: Secondary | ICD-10-CM | POA: Diagnosis not present

## 2017-11-04 DIAGNOSIS — Z79899 Other long term (current) drug therapy: Secondary | ICD-10-CM | POA: Insufficient documentation

## 2017-11-04 DIAGNOSIS — E785 Hyperlipidemia, unspecified: Secondary | ICD-10-CM | POA: Insufficient documentation

## 2017-11-04 DIAGNOSIS — G43709 Chronic migraine without aura, not intractable, without status migrainosus: Secondary | ICD-10-CM

## 2017-11-04 DIAGNOSIS — Z8673 Personal history of transient ischemic attack (TIA), and cerebral infarction without residual deficits: Secondary | ICD-10-CM | POA: Insufficient documentation

## 2017-11-04 DIAGNOSIS — Z8669 Personal history of other diseases of the nervous system and sense organs: Secondary | ICD-10-CM

## 2017-11-04 DIAGNOSIS — G4733 Obstructive sleep apnea (adult) (pediatric): Secondary | ICD-10-CM | POA: Diagnosis not present

## 2017-11-04 DIAGNOSIS — G43909 Migraine, unspecified, not intractable, without status migrainosus: Secondary | ICD-10-CM | POA: Diagnosis not present

## 2017-11-04 DIAGNOSIS — F419 Anxiety disorder, unspecified: Secondary | ICD-10-CM | POA: Diagnosis not present

## 2017-11-04 DIAGNOSIS — I43 Cardiomyopathy in diseases classified elsewhere: Secondary | ICD-10-CM | POA: Diagnosis not present

## 2017-11-04 DIAGNOSIS — Z7902 Long term (current) use of antithrombotics/antiplatelets: Secondary | ICD-10-CM | POA: Insufficient documentation

## 2017-11-04 DIAGNOSIS — F331 Major depressive disorder, recurrent, moderate: Secondary | ICD-10-CM

## 2017-11-04 DIAGNOSIS — G4709 Other insomnia: Secondary | ICD-10-CM | POA: Diagnosis not present

## 2017-11-04 DIAGNOSIS — IMO0002 Reserved for concepts with insufficient information to code with codable children: Secondary | ICD-10-CM

## 2017-11-04 DIAGNOSIS — F329 Major depressive disorder, single episode, unspecified: Secondary | ICD-10-CM | POA: Insufficient documentation

## 2017-11-04 DIAGNOSIS — K2100 Gastro-esophageal reflux disease with esophagitis, without bleeding: Secondary | ICD-10-CM

## 2017-11-04 DIAGNOSIS — M1990 Unspecified osteoarthritis, unspecified site: Secondary | ICD-10-CM | POA: Insufficient documentation

## 2017-11-04 DIAGNOSIS — G629 Polyneuropathy, unspecified: Secondary | ICD-10-CM

## 2017-11-04 DIAGNOSIS — I10 Essential (primary) hypertension: Secondary | ICD-10-CM | POA: Diagnosis not present

## 2017-11-04 MED ORDER — CLOPIDOGREL BISULFATE 75 MG PO TABS: 75.0000 mg | ORAL_TABLET | Freq: Every day | ORAL | 3 refills | Status: DC

## 2017-11-04 MED ORDER — ATORVASTATIN CALCIUM 80 MG PO TABS: 80.0000 mg | ORAL_TABLET | Freq: Every day | ORAL | 3 refills | Status: DC

## 2017-11-04 MED ORDER — LISINOPRIL 40 MG PO TABS: 40.0000 mg | ORAL_TABLET | Freq: Every day | ORAL | 1 refills | Status: DC

## 2017-11-04 MED ORDER — TRAZODONE HCL 100 MG PO TABS: 100.0000 mg | ORAL_TABLET | Freq: Every day | ORAL | 1 refills | Status: DC

## 2017-11-04 MED ORDER — HYDRALAZINE HCL 50 MG PO TABS: ORAL_TABLET | ORAL | 5 refills | Status: DC

## 2017-11-04 MED ORDER — GABAPENTIN 300 MG PO CAPS: 300.0000 mg | ORAL_CAPSULE | Freq: Two times a day (BID) | ORAL | 2 refills | Status: DC

## 2017-11-04 MED ORDER — SPIRONOLACTONE 25 MG PO TABS: 25.0000 mg | ORAL_TABLET | Freq: Every day | ORAL | 1 refills | Status: DC

## 2017-11-04 MED ORDER — AMLODIPINE BESYLATE 10 MG PO TABS: 10.0000 mg | ORAL_TABLET | Freq: Every day | ORAL | 1 refills | Status: DC

## 2017-11-04 MED ORDER — METOPROLOL TARTRATE 50 MG PO TABS: 50.0000 mg | ORAL_TABLET | Freq: Two times a day (BID) | ORAL | 5 refills | Status: DC

## 2017-11-04 MED ORDER — PANTOPRAZOLE SODIUM 20 MG PO TBEC: 20.0000 mg | DELAYED_RELEASE_TABLET | Freq: Every day | ORAL | 1 refills | Status: DC

## 2017-11-04 MED FILL — GABAPENTIN 300 MG CAPSULE: 300 | 30 days supply | Qty: 60 | Fill #0

## 2017-11-04 MED FILL — AMLODIPINE BESYLATE 10 MG T: 10 | 30 days supply | Qty: 30 | Fill #0

## 2017-11-04 MED FILL — METOPROLOL TARTRATE 50 MG T: 50 | 30 days supply | Qty: 60 | Fill #0

## 2017-11-04 MED FILL — LISINOPRIL 40 MG TABLET: 40 | 30 days supply | Qty: 30 | Fill #0

## 2017-11-04 MED FILL — traZODone HCL 100 MG TABS: 100 | 30 days supply | Qty: 30 | Fill #0

## 2017-11-04 MED FILL — hydrALAZINE HCL 50 MG TABS: 50 | 30 days supply | Qty: 90 | Fill #0

## 2017-11-04 MED FILL — ATORVASTATIN 80 MG TABLET: 80 | 30 days supply | Qty: 30 | Fill #0

## 2017-11-04 MED FILL — VENLAFAXINE HCL ER 150 MG C: 150 | 30 days supply | Qty: 30 | Fill #3

## 2017-11-04 NOTE — Progress Notes (Addendum)
Assessment & Plan:  Brandy Roberts was seen today for establish care and medication refill.  Diagnoses and all orders for this visit:  Essential hypertension -     amLODipine (NORVASC) 10 MG tablet; Take 1 tablet (10 mg total) by mouth daily. -     hydrALAZINE (APRESOLINE) 50 MG tablet; TAKE 1 TABLET BY MOUTH 3 TIMES DAILY WITH MEALS. -     lisinopril (PRINIVIL,ZESTRIL) 40 MG tablet; Take 1 tablet (40 mg total) by mouth daily. -     metoprolol tartrate (LOPRESSOR) 50 MG tablet; Take 1 tablet (50 mg total) by mouth 2 (two) times daily. -     spironolactone (ALDACTONE) 25 MG tablet; Take 1 tablet (25 mg total) by mouth daily. Continue all antihypertensives as prescribed.  Remember to bring in your blood pressure log with you for your follow up appointment.  DASH/Mediterranean Diets are healthier choices for HTN.    Dyslipidemia -     atorvastatin (LIPITOR) 80 MG tablet; Take 1 tablet (80 mg total) by mouth daily at 6 PM. INSTRUCTIONS: Work on a low fat, heart healthy diet and participate in regular aerobic exercise program by working out at least 150 minutes per week. No fried foods. No junk foods, sodas, sugary drinks, unhealthy snacking, alcohol or smoking.    History of CVA (cerebrovascular accident) without residual deficits -     clopidogrel (PLAVIX) 75 MG tablet; Take 1 tablet (75 mg total) by mouth daily. Continue follow up with Neurology as instructed Last office visit 10-19-2017  Gastroesophageal reflux disease with esophagitis -     pantoprazole (PROTONIX) 20 MG tablet; Take 1 tablet (20 mg total) by mouth daily. INSTRUCTIONS: Avoid GERD Triggers: acidic, spicy or fried foods, caffeine, coffee, sodas,  alcohol and chocolate.   Cardiomyopathy in disease classified elsewhere [facility]) Follow up with cardiology as instructed.  Continue follow up with Neurology as instructed Last office visit 10-19-2017   OSA (obstructive sleep apnea) -     Nocturnal polysomnography; Future  Other  insomnia -     traZODone (DESYREL) 100 MG tablet; Take 1 tablet (100 mg total) by mouth at bedtime.  Chronic migraine Continue follow up with Neurology as instructed Last office visit 10-19-2017   Patient has been counseled on age-appropriate routine health concerns for screening and prevention. These are reviewed and up-to-date. Referrals have been placed accordingly. Immunizations are up-to-date or declined.    Subjective:   Chief Complaint  Patient presents with  . Establish Care    Establish care for hypertension, and requesting if she can take something to help her sleep.   . Medication Refill   HPI Brandy Roberts 47 y.o. female presents to office today to establish care.  She has a past medical history of anxiety and depression, sleep apnea (she reports that she has not used her CPAP in several years), hypertension, hyperlipidemia, stroke and migraines.  Her neurologist is currently treating her for her migraines. She declined blood work today and would like to defer it until her follow up in 4 weeks.    Essential Hypertension Chronic. Well controlled today.  Blood pressure regimen includes amlodipine 10 mg daily, hydralazine 50 mg 3 times daily lisinopril 40 mg daily, Lopressor 50 mg twice daily, and spironolactone 25 mg daily. She sees Dr. Wynonia Lawman (Cardiology) every 6 months. Denies chest pain, shortness of breath, palpitations, lightheadedness, dizziness, headaches or BLE edema.  BP Readings from Last 3 Encounters:  11/04/17 130/71  10/19/17 (!) 142/68  07/06/17 130/62  Anxiety and Depression Taking Effexor 150mg  daily. I have offered her a referral to psychiatry however she tells me Dr. Tomi Likens is managing her effexor and she does not want to be referred to a psychiatrist. She would like to speak to the onsite social worker today however. She currently denies any suicidal ideation at this time. Endorses medication compliance.  Depression screen PHQ 2/9 11/04/2017  Decreased  Interest 3  Down, Depressed, Hopeless 2  PHQ - 2 Score 5  Altered sleeping 2  Tired, decreased energy 2  Change in appetite 2  Feeling bad or failure about yourself  2  Trouble concentrating 3  Moving slowly or fidgety/restless 2  Suicidal thoughts 3  PHQ-9 Score 21  Some recent data might be hidden    Hyperlipidemia Patient presents for follow up to hyperlipidemia.  She is medication compliant taking atorvastatin 80mg  (also history of stroke). She is not diet compliant and denies exertional chest pressure/discomfort, poor exercise tolerance and skin xanthelasma or statin intolerance including myalgias.  Lab Results  Component Value Date   CHOL 132 10/25/2016   Lab Results  Component Value Date   HDL 40 (L) 10/25/2016   Lab Results  Component Value Date   LDLCALC 84 10/25/2016   Lab Results  Component Value Date   TRIG 38 10/25/2016   Lab Results  Component Value Date   CHOLHDL 3.3 10/25/2016    Insomnia She has trouble falling asleep. Sleep pattern is irregular. She is sleeping throughout the day and doesn't fall asleep until the early am hours. She reports taking her neighbor's "sleeping pill" and "it knocked me out". She can not recall the name of the medication. I will trial her on trazodone today.   Review of Systems  Constitutional: Negative for fever, malaise/fatigue and weight loss.  HENT: Negative.  Negative for nosebleeds.   Eyes: Negative.  Negative for blurred vision, double vision and photophobia.  Respiratory: Negative.  Negative for cough and shortness of breath.   Cardiovascular: Negative.  Negative for chest pain, palpitations and leg swelling.  Gastrointestinal: Positive for heartburn. Negative for nausea and vomiting.  Musculoskeletal: Positive for falls. Negative for myalgias.  Neurological: Positive for weakness and headaches. Negative for dizziness, focal weakness and seizures.  Psychiatric/Behavioral: Positive for depression and memory loss.  Negative for hallucinations, substance abuse and suicidal ideas. The patient is nervous/anxious and has insomnia.     Past Medical History:  Diagnosis Date  . Anemia   . Anxiety   . Depression   . Hyperlipidemia   . Hypertension   . Migraines   . Osteoarthritis   . Sleep apnea   . Stroke [facility])     Past Surgical History:  Procedure Laterality Date  . CESAREAN SECTION    . GANGLION CYST EXCISION    . TEE WITHOUT CARDIOVERSION N/A 10/09/2014   Procedure: TRANSESOPHAGEAL ECHOCARDIOGRAM (TEE);  Surgeon: Sanda Klein, MD;  Location: [facility] ENDOSCOPY;  Service: Cardiovascular;  Laterality: N/A;  . TONSILLECTOMY      Family History  Problem Relation Age of Onset  . Cancer Mother        type unknown  . Hypertension Mother   . Hypertension Sister   . Diabetes Sister   . Cancer Maternal Aunt        4 aunts died of cancer types unknown    Social History Reviewed with no changes to be made today.   Outpatient Medications Prior to Visit  Medication Sig Dispense Refill  . butalbital-acetaminophen-caffeine (FIORICET  WITH CODEINE) 50-325-40-30 MG capsule Take 1 capsule by mouth every 6 (six) hours as needed.    . gabapentin (NEURONTIN) 300 MG capsule Take 1 capsule (300 mg total) by mouth 2 (two) times daily. 60 capsule 2  . ondansetron (ZOFRAN) 4 MG tablet TAKE 1 TABLET (4 MG TOTAL) BY MOUTH EVERY 8 (EIGHT) HOURS AS NEEDED FOR NAUSEA OR VOMITING. 20 tablet 0  . topiramate (TOPAMAX) 50 MG tablet Take 1 tablet (50 mg total) by mouth daily. 90 tablet 1  . venlafaxine XR (EFFEXOR-XR) 150 MG 24 hr capsule Take 1 capsule (150 mg total) daily with breakfast by mouth. 30 capsule 5  . amLODipine (NORVASC) 10 MG tablet Take 1 tablet (10 mg total) by mouth daily. 90 tablet 1  . atorvastatin (LIPITOR) 80 MG tablet Take 1 tablet (80 mg total) by mouth daily at 6 PM. 90 tablet 3  . clopidogrel (PLAVIX) 75 MG tablet Take 1 tablet (75 mg total) by mouth daily. 90 tablet 3  . hydrALAZINE (APRESOLINE) 50  MG tablet TAKE 1 TABLET BY MOUTH 3 TIMES DAILY WITH MEALS. 90 tablet 5  . lisinopril (PRINIVIL,ZESTRIL) 40 MG tablet Take 1 tablet (40 mg total) by mouth daily. 90 tablet 1  . metoprolol tartrate (LOPRESSOR) 50 MG tablet Take 1 tablet (50 mg total) by mouth 2 (two) times daily. 60 tablet 5  . pantoprazole (PROTONIX) 20 MG tablet Take 1 tablet (20 mg total) by mouth daily. 30 tablet 5  . spironolactone (ALDACTONE) 25 MG tablet Take 1 tablet (25 mg total) by mouth daily. 30 tablet 5  . diclofenac sodium (VOLTAREN) 1 % GEL Apply 2 g topically 3 (three) times daily as needed (knee pain). (Patient not taking: Reported on 11/04/2017) 100 g 1  . ibuprofen (ADVIL,MOTRIN) 600 MG tablet Take 1 tablet (600 mg total) by mouth every 8 (eight) hours as needed. (Patient not taking: Reported on 10/19/2017) 20 tablet 0  . nicotine (NICODERM CQ - DOSED IN MG/24 HOURS) 21 mg/24hr patch Place 1 patch (21 mg total) onto the skin daily. (Patient not taking: Reported on 11/04/2017) 21 patch 2  . traMADol (ULTRAM) 50 MG tablet Take 1 tablet (50 mg total) by mouth every 8 (eight) hours as needed for severe pain. (Patient not taking: Reported on 10/19/2017) 30 tablet 0  . predniSONE (DELTASONE) 10 MG tablet DAY 1: TAKE 6 TABLETS BY MOUTH WITH MEAL; DAY 2: TAKE 5 TABLETS; DAY 3: TAKE 4 TABLETS; DAY 4: TAKE 3 TABLETS; DAY 5: TAKE 2 TABLETS; DAY 6: TAKE 1 TABLET. (Patient not taking: Reported on 11/04/2017) 21 tablet 0  . predniSONE (STERAPRED UNI-PAK 21 TAB) 10 MG (21) TBPK tablet As directed (Patient not taking: Reported on 11/04/2017) 21 tablet 0   No facility-administered medications prior to visit.     Allergies  Allergen Reactions  . Tylenol [Acetaminophen] Other (See Comments)    Pt stated tylenol gives her extreme headache       Objective:    BP 130/71 (BP Location: Right Arm, Patient Position: Sitting, Cuff Size: Large)   Pulse (!) 57   Temp 98.5 F (36.9 C) (Oral)   Ht 5\' 6"  (1.676 m)   Wt 200 lb 9.6 oz (91 kg)    SpO2 98%   BMI 32.38 kg/m   Wt Readings from Last 3 Encounters:  11/04/17 200 lb 9.6 oz (91 kg)  10/19/17 198 lb (89.8 kg)  07/07/17 213 lb (96.6 kg)    Physical Exam  Constitutional: She is  oriented to person, place, and time. She appears well-developed and well-nourished. She is cooperative.  HENT:  Head: Normocephalic and atraumatic.  Eyes: EOM are normal.  Neck: Normal range of motion.  Cardiovascular: Bradycardia present. Exam reveals no gallop and no friction rub.  Murmur heard. Pulmonary/Chest: Effort normal and breath sounds normal. No tachypnea. No respiratory distress. She has no decreased breath sounds. She has no wheezes. She has no rhonchi. She has no rales. She exhibits no tenderness.  Abdominal: Soft. Bowel sounds are normal.  Musculoskeletal: Normal range of motion. She exhibits no edema.  Neurological: She is alert and oriented to person, place, and time. Coordination and gait (using a 4 prong cane today. Endorses history of falls over the past several months) abnormal.  Skin: Skin is warm and dry.  Psychiatric: She has a normal mood and affect. Her behavior is normal. Judgment and thought content normal.  Nursing note and vitals reviewed.      Patient has been counseled extensively about nutrition and exercise as well as the importance of adherence with medications and regular follow-up. The patient was given clear instructions to go to ER or return to medical center if symptoms don't improve, worsen or new problems develop. The patient verbalized understanding.   Follow-up: Return in about 1 month (around 12/02/2017) for insomnia; fasting labs .   [name], FNP-BC [facility], [place]   11/04/2017, 2:23 PM

## 2017-11-04 NOTE — Patient Instructions (Addendum)
Insomnia Insomnia is a sleep disorder that makes it difficult to fall asleep or to stay asleep. Insomnia can cause tiredness (fatigue), low energy, difficulty concentrating, mood swings, and poor performance at work or school. There are three different ways to classify insomnia:  Difficulty falling asleep.  Difficulty staying asleep.  Waking up too early in the morning.  Any type of insomnia can be long-term (chronic) or short-term (acute). Both are common. Short-term insomnia usually lasts for three months or less. Chronic insomnia occurs at least three times a week for longer than three months. What are the causes? Insomnia may be caused by another condition, situation, or substance, such as:  Anxiety.  Certain medicines.  Gastroesophageal reflux disease (GERD) or other gastrointestinal conditions.  Asthma or other breathing conditions.  Restless legs syndrome, sleep apnea, or other sleep disorders.  Chronic pain.  Menopause. This may include hot flashes.  Stroke.  Abuse of alcohol, tobacco, or illegal drugs.  Depression.  Caffeine.  Neurological disorders, such as Alzheimer disease.  An overactive thyroid (hyperthyroidism).  The cause of insomnia may not be known. What increases the risk? Risk factors for insomnia include:  Gender. Women are more commonly affected than men.  Age. Insomnia is more common as you get older.  Stress. This may involve your professional or personal life.  Income. Insomnia is more common in people with lower income.  Lack of exercise.  Irregular work schedule or night shifts.  Traveling between different time zones.  What are the signs or symptoms? If you have insomnia, trouble falling asleep or trouble staying asleep is the main symptom. This may lead to other symptoms, such as:  Feeling fatigued.  Feeling nervous about going to sleep.  Not feeling rested in the morning.  Having trouble concentrating.  Feeling  irritable, anxious, or depressed.  How is this treated? Treatment for insomnia depends on the cause. If your insomnia is caused by an underlying condition, treatment will focus on addressing the condition. Treatment may also include:  Medicines to help you sleep.  Counseling or therapy.  Lifestyle adjustments.  Follow these instructions at home:  Take medicines only as directed by your health care provider.  Keep regular sleeping and waking hours. Avoid naps.  Keep a sleep diary to help you and your health care provider figure out what could be causing your insomnia. Include: ? When you sleep. ? When you wake up during the night. ? How well you sleep. ? How rested you feel the next day. ? Any side effects of medicines you are taking. ? What you eat and drink.  Make your bedroom a comfortable place where it is easy to fall asleep: ? Put up shades or special blackout curtains to block light from outside. ? Use a white noise machine to block noise. ? Keep the temperature cool.  Exercise regularly as directed by your health care provider. Avoid exercising right before bedtime.  Use relaxation techniques to manage stress. Ask your health care provider to suggest some techniques that may work well for you. These may include: ? Breathing exercises. ? Routines to release muscle tension. ? Visualizing peaceful scenes.  Cut back on alcohol, caffeinated beverages, and cigarettes, especially close to bedtime. These can disrupt your sleep.  Do not overeat or eat spicy foods right before bedtime. This can lead to digestive discomfort that can make it hard for you to sleep.  Limit screen use before bedtime. This includes: ? Watching TV. ? Using your smartphone, tablet, and   computer.  Stick to a routine. This can help you fall asleep faster. Try to do a quiet activity, brush your teeth, and go to bed at the same time each night.  Get out of bed if you are still awake after 15 minutes  of trying to sleep. Keep the lights down, but try reading or doing a quiet activity. When you feel sleepy, go back to bed.  Make sure that you drive carefully. Avoid driving if you feel very sleepy.  Keep all follow-up appointments as directed by your health care provider. This is important. Contact a health care provider if:  You are tired throughout the day or have trouble in your daily routine due to sleepiness.  You continue to have sleep problems or your sleep problems get worse. Get help right away if:  You have serious thoughts about hurting yourself or someone else. This information is not intended to replace advice given to you by your health care provider. Make sure you discuss any questions you have with your health care provider. Document Released: 05/16/2000 Document Revised: 10/19/2015 Document Reviewed: 02/17/2014 Elsevier Interactive Patient Education  2018 [employer] and Stress Management Stress is a normal reaction to life events. It is what you feel when life demands more than you are used to or more than you can handle. Some stress can be useful. For example, the stress reaction can help you catch the last bus of the day, study for a test, or meet a deadline at work. But stress that occurs too often or for too long can cause problems. It can affect your emotional health and interfere with relationships and normal daily activities. Too much stress can weaken your immune system and increase your risk for physical illness. If you already have a medical problem, stress can make it worse. What are the causes? All sorts of life events may cause stress. An event that causes stress for one person may not be stressful for another person. Major life events commonly cause stress. These may be positive or negative. Examples include losing your job, moving into a new home, getting married, having a baby, or losing a loved one. Less obvious life events may also cause stress,  especially if they occur day after day or in combination. Examples include working long hours, driving in traffic, caring for children, being in debt, or being in a difficult relationship. What are the signs or symptoms? Stress may cause emotional symptoms including, the following:  Anxiety. This is feeling worried, afraid, on edge, overwhelmed, or out of control.  Anger. This is feeling irritated or impatient.  Depression. This is feeling sad, down, helpless, or guilty.  Difficulty focusing, remembering, or making decisions.  Stress may cause physical symptoms, including the following:  Aches and pains. These may affect your head, neck, back, stomach, or other areas of your body.  Tight muscles or clenched jaw.  Low energy or trouble sleeping.  Stress may cause unhealthy behaviors, including the following:  Eating to feel better (overeating) or skipping meals.  Sleeping too little, too much, or both.  Working too much or putting off tasks (procrastination).  Smoking, drinking alcohol, or using drugs to feel better.  How is this diagnosed? Stress is diagnosed through an assessment by your health care provider. Your health care provider will ask questions about your symptoms and any stressful life events.Your health care provider will also ask about your medical history and may order blood tests or other tests. Certain medical  conditions and medicine can cause physical symptoms similar to stress. Mental illness can cause emotional symptoms and unhealthy behaviors similar to stress. Your health care provider may refer you to a mental health professional for further evaluation. How is this treated? Stress management is the recommended treatment for stress.The goals of stress management are reducing stressful life events and coping with stress in healthy ways. Techniques for reducing stressful life events include the following:  Stress identification. Self-monitor for stress and  identify what causes stress for you. These skills may help you to avoid some stressful events.  Time management. Set your priorities, keep a calendar of events, and learn to say "no." These tools can help you avoid making too many commitments.  Techniques for coping with stress include the following:  Rethinking the problem. Try to think realistically about stressful events rather than ignoring them or overreacting. Try to find the positives in a stressful situation rather than focusing on the negatives.  Exercise. Physical exercise can release both physical and emotional tension. The key is to find a form of exercise you enjoy and do it regularly.  Relaxation techniques. These relax the body and mind. Examples include yoga, meditation, tai chi, biofeedback, deep breathing, progressive muscle relaxation, listening to music, being out in nature, journaling, and other hobbies. Again, the key is to find one or more that you enjoy and can do regularly.  Healthy lifestyle. Eat a balanced diet, get plenty of sleep, and do not smoke. Avoid using alcohol or drugs to relax.  Strong support network. Spend time with family, friends, or other people you enjoy being around.Express your feelings and talk things over with someone you trust.  Counseling or talktherapy with a mental health professional may be helpful if you are having difficulty managing stress on your own. Medicine is typically not recommended for the treatment of stress.Talk to your health care provider if you think you need medicine for symptoms of stress. Follow these instructions at home:  Keep all follow-up visits as directed by your health care provider.  Take all medicines as directed by your health care provider. Contact a health care provider if:  Your symptoms get worse or you start having new symptoms.  You feel overwhelmed by your problems and can no longer manage them on your own. Get help right away if:  You feel like  hurting yourself or someone else. This information is not intended to replace advice given to you by your health care provider. Make sure you discuss any questions you have with your health care provider. Document Released: 11/12/2000 Document Revised: 10/25/2015 Document Reviewed: 01/11/2013 Elsevier Interactive Patient Education  2017 [employer].

## 2017-11-04 NOTE — BH Specialist Note (Signed)
Integrated Behavioral Health Follow Up Visit  MRN: [id number] Name: Franshesca Chipman  Number of [place] Clinician visits: 3/6 Session Start time: 2:00 PM  Session End time: 2:30 PM Total time: 30 minutes  Type of Service: [place] Interpretor:No. Interpretor Name and Language: N/A  SUBJECTIVE: Brylie Sneath is a 47 y.o. female accompanied by self Patient was referred by NP Raul Del for depression and anxiety. Patient reports the following symptoms/concerns: decreased pleasure in doing things, irritability, difficulty sleeping, low motivation, decreased concentration, overwhelming feelings of sadness and worry, and hx of suicidal ideations Duration of problem: Ongoing; Severity of problem: moderate  OBJECTIVE: Mood: Anxious and Affect: Appropriate Risk of harm to self or others: No plan to harm self or others  LIFE CONTEXT: Family and Social: Pt resides independently. She receives limited support from family and friends School/Work: Pt receives disability [phone]) Self-Care: Pt participates in medication management through Neurologist. She smokes marijuana occasionally Life Changes: Pt reports an increase in headaches and unmanaged hypertension. She reports conflict with adult son and his girlfriend that resulted in pt kicking the family out of her household  GOALS ADDRESSED: Patient will: 1.  Reduce symptoms of: anxiety, depression and stress  2.  Increase knowledge and/or ability of: self-management skills and stress reduction  3.  Demonstrate ability to: Increase healthy adjustment to current life circumstances and Increase adequate support systems for patient/family  INTERVENTIONS: Interventions utilized:  Mindfulness or Relaxation Training and Supportive Counseling Standardized Assessments completed: GAD-7 and PHQ 2&9 with C-SSRS  ASSESSMENT: Patient currently experiencing depression and anxiety triggered by  ongoing medical conditions and ongoing conflict with family. She reports decreased pleasure in doing things, irritability, difficulty sleeping, low motivation, decreased concentration, overwhelming feelings of sadness and worry, and hx of suicidal ideations. Pt currently denies SI/HI/AVH or intent to harm self or others.   Patient has good insight as to how ongoing stress can negatively impact her health. LCSWA commended her for establishing healthy boundaries with family and introduced therapeutic interventions to promote mindfulness/relaxation to assist pt in improving sleep and decreasing symptoms. Pt participates in medication management through Neurologist and is not interested in referral to psychotherapy. She is aware of crisis intervention resources within the community.   PLAN: 1. Follow up with behavioral health clinician on : Pt was encouraged tocontact LCSWA if symptoms worsen or fail to improveto schedule behavioral appointments at [facility]. 2. Behavioral recommendations: LCSWA recommends that pt apply healthy coping skills discussed and comply with medication management. Pt is encouraged to schedule follow up appointment with LCSWA 3. Referral(s): [facility]) 4. "From scale of 1-10, how likely are you to follow plan?":   Rebekah Chesterfield, LCSW 11/06/17 10:22 AM

## 2017-11-04 NOTE — Addendum Note (Signed)
Addended by: [name] on: 11/04/2017 04:17 PM   Modules accepted: Orders

## 2017-11-06 ENCOUNTER — Telehealth: Payer: Self-pay | Admitting: Neurology

## 2017-11-06 NOTE — Telephone Encounter (Signed)
Patient called and she will be having an MRI on 11/13/17 and she would like to have something called in for her to be able to have the MRI due to being Claustrophobic. She also is needing a refill on her Effexor. She uses [facility]. She would like you to call her as well to let her know when and where to pick up. Thanks

## 2017-11-06 NOTE — Telephone Encounter (Signed)
Yes.  Taken 45 minutes prior to MRI.  Must have a driver.

## 2017-11-09 ENCOUNTER — Other Ambulatory Visit: Payer: Self-pay

## 2017-11-09 MED ORDER — VENLAFAXINE HCL ER 150 MG PO CP24: 150.0000 mg | ORAL_CAPSULE | Freq: Every day | ORAL | 3 refills | Status: DC

## 2017-11-09 MED ORDER — DIAZEPAM 5 MG PO TABS
5.0000 mg | ORAL_TABLET | Freq: Once | ORAL | 0 refills | Status: AC
Start: 2017-11-09 — End: 2017-11-09

## 2017-11-09 MED FILL — TOPIRAMATE 50 MG TABLET: 50 | 30 days supply | Qty: 30 | Fill #3

## 2017-11-11 ENCOUNTER — Emergency Department (HOSPITAL_BASED_OUTPATIENT_CLINIC_OR_DEPARTMENT_OTHER): Payer: Medicare Other

## 2017-11-11 ENCOUNTER — Telehealth: Payer: Self-pay

## 2017-11-11 ENCOUNTER — Other Ambulatory Visit: Payer: Self-pay

## 2017-11-11 ENCOUNTER — Encounter (HOSPITAL_BASED_OUTPATIENT_CLINIC_OR_DEPARTMENT_OTHER): Payer: Self-pay | Admitting: Emergency Medicine

## 2017-11-11 ENCOUNTER — Emergency Department (HOSPITAL_BASED_OUTPATIENT_CLINIC_OR_DEPARTMENT_OTHER)
Admission: EM | Admit: 2017-11-11 | Discharge: 2017-11-11 | Disposition: A | Discharge status: Home / Self Care | Payer: Medicare Other | Attending: Emergency Medicine | Admitting: Emergency Medicine

## 2017-11-11 DIAGNOSIS — I10 Essential (primary) hypertension: Secondary | ICD-10-CM | POA: Diagnosis not present

## 2017-11-11 DIAGNOSIS — G43009 Migraine without aura, not intractable, without status migrainosus: Secondary | ICD-10-CM | POA: Insufficient documentation

## 2017-11-11 DIAGNOSIS — Z79899 Other long term (current) drug therapy: Secondary | ICD-10-CM | POA: Diagnosis not present

## 2017-11-11 DIAGNOSIS — R0902 Hypoxemia: Secondary | ICD-10-CM | POA: Diagnosis not present

## 2017-11-11 DIAGNOSIS — Z87891 Personal history of nicotine dependence: Secondary | ICD-10-CM | POA: Diagnosis not present

## 2017-11-11 DIAGNOSIS — R42 Dizziness and giddiness: Secondary | ICD-10-CM | POA: Diagnosis not present

## 2017-11-11 DIAGNOSIS — R51 Headache: Secondary | ICD-10-CM | POA: Diagnosis not present

## 2017-11-11 LAB — BASIC METABOLIC PANEL
ANION GAP: 8 (ref 5–15)
BUN: 16 mg/dL (ref 6–20)
CALCIUM: 9.2 mg/dL (ref 8.9–10.3)
CO2: 23 mmol/L (ref 22–32)
Chloride: 110 mmol/L (ref 101–111)
Creatinine, Ser: 0.98 mg/dL (ref 0.44–1.00)
GFR calc non Af Amer: >60 mL/min (ref >60)
Glucose, Bld: 106 mg/dL — ABNORMAL HIGH (ref 65–99)
Potassium: 3.7 mmol/L (ref 3.5–5.1)
Sodium: 141 mmol/L (ref 135–145)

## 2017-11-11 LAB — CBC WITH DIFFERENTIAL/PLATELET
BASOS ABS: 0 10*3/uL (ref 0.0–0.1)
Basophils Relative: 0 %
Eosinophils Absolute: 0.2 10*3/uL (ref 0.0–0.7)
Eosinophils Relative: 2 %
HEMATOCRIT: 39.1 % (ref 36.0–46.0)
HEMOGLOBIN: 12.8 g/dL (ref 12.0–15.0)
Lymphocytes Relative: 27 %
Lymphs Abs: 2.2 10*3/uL (ref 0.7–4.0)
MCH: 28 pg (ref 26.0–34.0)
MCHC: 32.7 g/dL (ref 30.0–36.0)
MCV: 85.6 fL (ref 78.0–100.0)
MONOS PCT: 9 %
Monocytes Absolute: 0.7 10*3/uL (ref 0.1–1.0)
NEUTROS ABS: 5.1 10*3/uL (ref 1.7–7.7)
NEUTROS PCT: 62 %
Platelets: 252 10*3/uL (ref 150–400)
RBC: 4.57 MIL/uL (ref 3.87–5.11)
RDW: 15.2 % (ref 11.5–15.5)
WBC: 8.2 10*3/uL (ref 4.0–10.5)

## 2017-11-11 MED ORDER — METOCLOPRAMIDE HCL 10 MG PO TABS: 10.0000 mg | ORAL_TABLET | Freq: Four times a day (QID) | ORAL | 0 refills | Status: DC | PRN

## 2017-11-11 MED ORDER — SODIUM CHLORIDE 0.9 % IV BOLUS
500.0000 mL | Freq: Once | INTRAVENOUS | Status: AC
  Administered 2017-11-11: 500 mL via INTRAVENOUS

## 2017-11-11 MED ORDER — KETOROLAC TROMETHAMINE 30 MG/ML IJ SOLN
30.0000 mg | Freq: Once | INTRAMUSCULAR | Status: AC
  Administered 2017-11-11: 30 mg via INTRAVENOUS
  Filled 2017-11-11: qty 1

## 2017-11-11 MED ORDER — METOCLOPRAMIDE HCL 5 MG/ML IJ SOLN
10.0000 mg | Freq: Once | INTRAMUSCULAR | Status: AC
  Administered 2017-11-11: 10 mg via INTRAVENOUS
  Filled 2017-11-11: qty 2

## 2017-11-11 MED ORDER — DIPHENHYDRAMINE HCL 50 MG/ML IJ SOLN
50.0000 mg | Freq: Once | INTRAMUSCULAR | Status: AC
  Administered 2017-11-11: 50 mg via INTRAVENOUS
  Filled 2017-11-11: qty 1

## 2017-11-11 NOTE — ED Provider Notes (Signed)
Emergency Department Provider Note   I have reviewed the triage vital signs and the nursing notes.   HISTORY  Chief Complaint Headache   HPI Brandy Roberts is a 47 y.o. female with PMH of HTN, HLD, CVA, and migraine HA presents to the emergency department for evaluation of migraine headache with dizziness.  The patient developed and entire head headache over the past 2 days.  She has taken steroid with only mild relief.  She is following with her neurologist as an outpatient and discussed her persistent headache symptoms with him by phone.  She was referred to the emergency department for evaluation and treatment.  She denies any new weakness or numbness other than her baseline left side hemiplegia.  She has felt lightheaded with some faint sensation of vertigo since the headache began.  She denies any visual disturbance.  No fevers or chills.  No head trauma. Denies any sudden onset, maximal intensity HA symptoms.   Past Medical History:  Diagnosis Date  . Anemia   . Anxiety   . Depression   . Hyperlipidemia   . Hypertension   . Migraines   . Osteoarthritis   . Sleep apnea   . Stroke [facility])     Patient Active Problem List   Diagnosis Date Noted  . CVA (cerebral vascular accident) ([place]) 10/25/2016  . Post-menopausal bleeding 09/24/2016  . OSA (obstructive sleep apnea) 09/03/2015  . Heart valve vegetation 09/03/2015  . Snoring 05/02/2015  . Essential hypertension 05/02/2015  . Tobacco use disorder 05/02/2015  . Headache 05/01/2015  . Dysphagia, post-stroke 02/20/2015  . Former smoker 01/24/2015  . Alteration of sensations, post-stroke 12/22/2014  . Cognitive deficit, post-stroke 11/23/2014  . Left hemiparesis ([place]) 10/23/2014  . Cardiomyopathy due to hypertension ([place]) 10/23/2014  . Chronic ischemic vertebrobasilar artery thalamic stroke   . Aortic valve vegetation   . Hypertensive heart disease   . Overweight (BMI 25.0-29.9)   . H/O noncompliance with medical  treatment, presenting hazards to health   . Aortic regurgitation   . Cerebral infarction due to thrombosis of right middle cerebral artery ([place])   . Hyperlipidemia   . Embolic stroke involving right middle cerebral artery ([place]) 10/06/2014    Past Surgical History:  Procedure Laterality Date  . CESAREAN SECTION    . GANGLION CYST EXCISION    . TEE WITHOUT CARDIOVERSION N/A 10/09/2014   Procedure: TRANSESOPHAGEAL ECHOCARDIOGRAM (TEE);  Surgeon: Sanda Klein, MD;  Location[facility] ENDOSCOPY;  Service: Cardiovascular;  Laterality: N/A;  . TONSILLECTOMY      Current Outpatient Rx  . Order #: [id number] Class: Normal  . Order #: [id number] Class: Normal  . Order #: [id number] Class: Historical Med  . Order #: [id number] Class: Normal  . Order #: [id number] Class: Normal  . Order #: [id number] Class: Normal  . Order #: [id number] Class: Normal  . Order #: [id number] Class: Print  . Order #: [id number] Class: Normal  . Order #: [id number] Class: Print  . Order #: [id number] Class: Normal  . Order #: [id number] Class: Normal  . Order #: [id number] Class: Normal  . Order #: [id number] Class: Normal  . Order #: [id number] Class: Normal  . Order #: [id number] Class: Normal  . Order #: [id number] Class: Print  . Order #: [id number] Class: Normal  . Order #: [id number] Class: Normal  . Order #: [id number] Class: Normal    Allergies Tylenol [acetaminophen]  Family History  Problem Relation Age of Onset  . Cancer Mother        type unknown  . Hypertension Mother   . Hypertension Sister   .  Diabetes Sister   . Cancer Maternal Aunt        4 aunts died of cancer types unknown    Social History Social History   Tobacco Use  . Smoking status: Former Smoker    Packs/day: 0.25    Types: Cigarettes    Last attempt to quit: 08/31/2017    Years since quitting: 0.2  . Smokeless tobacco: Never Used  . Tobacco comment: Pt. still smoke marijuana.   Substance Use Topics  . Alcohol use: No    Alcohol/week: 0.0 oz  . Drug use:  Yes    Types: Marijuana    Review of Systems  Constitutional: No fever/chills Eyes: No visual changes. ENT: No sore throat. Positive vertigo.  Cardiovascular: Denies chest pain. Respiratory: Denies shortness of breath. Gastrointestinal: No abdominal pain.  No nausea, no vomiting.  No diarrhea.  No constipation. Genitourinary: Negative for dysuria. Musculoskeletal: Negative for back pain. Skin: Negative for rash. Neurological: Negative for focal weakness or numbness. Positive HA.   10-point ROS otherwise negative.  ____________________________________________   PHYSICAL EXAM:  VITAL SIGNS: ED Triage Vitals  Enc Vitals Group     BP 11/11/17 1920 134/69     Pulse Rate 11/11/17 1920 78     Resp 11/11/17 1920 20     Temp 11/11/17 1920 100.3 F (37.9 C)     Temp Source 11/11/17 1920 Oral     SpO2 11/11/17 1920 98 %     Weight 11/11/17 1921 200 lb (90.7 kg)     Height 11/11/17 1921 5\' 6"  (1.676 m)     Pain Score 11/11/17 1933 10   Constitutional: Alert and oriented. Well appearing and in no acute distress. Eyes: Conjunctivae are normal. PERRL. EOMI. Head: Atraumatic. Nose: No congestion/rhinnorhea. Mouth/Throat: Mucous membranes are moist.  Oropharynx non-erythematous. Neck: No stridor.  No meningeal signs.  Cardiovascular: Normal rate, regular rhythm. Good peripheral circulation. Grossly normal heart sounds.   Respiratory: Normal respiratory effort.  No retractions. Lungs CTAB. Gastrointestinal: Soft and nontender. No distention.  Musculoskeletal: No lower extremity tenderness nor edema. No gross deformities of extremities. Neurologic:  Normal speech and language. Positive left face droop with 3/5 strength in the LUE and LLE (baseline).  Skin:  Skin is warm, dry and intact. No rash noted.  ____________________________________________   LABS (all labs ordered are listed, but only abnormal results are displayed)  Labs Reviewed  BASIC METABOLIC PANEL - Abnormal;  Notable for the following components:      Result Value   Glucose, Bld 106 (*)    All other components within normal limits  CBC WITH DIFFERENTIAL/PLATELET   ____________________________________________  RADIOLOGY  Ct Head Wo Contrast  Result Date: 11/11/2017 CLINICAL DATA:  Progressive headache for several days, vertigo. Symptoms different than prior headaches. History of hypertension, hyperlipidemia, migraine and stroke. EXAM: CT HEAD WITHOUT CONTRAST TECHNIQUE: Contiguous axial images were obtained from the base of the skull through the vertex without intravenous contrast. COMPARISON:  CT HEAD January 12, 2017 and MRI of the head Oct 25, 2016 FINDINGS: BRAIN: No intraparenchymal hemorrhage, mass effect nor midline shift. Old LEFT basal ganglia infarct. Patchy supratentorial white matter hypodensities. Mild parenchymal brain volume loss. No hydrocephalus. No acute large vascular territory infarcts. No abnormal extra-axial fluid collections. Basal cisterns are patent. VASCULAR: Trace calcific atherosclerosis carotid bifurcations. SKULL/SOFT TISSUES: No skull fracture. No significant soft tissue swelling. ORBITS/SINUSES: The included ocular globes and orbital contents are normal.The mastoid aircells and included paranasal sinuses are well-aerated. OTHER: None.  IMPRESSION: 1. No acute intracranial process. 2. Old LEFT basal ganglia infarct. Mild chronic small vessel ischemic changes. 3. Mild parenchymal brain volume loss for age. Electronically Signed   By: Elon Alas M.D.   On: 11/11/2017 20:25    ____________________________________________   PROCEDURES  Procedure(s) performed:   Procedures  None ____________________________________________   INITIAL IMPRESSION / ASSESSMENT AND PLAN / ED COURSE  Pertinent labs & imaging results that were available during my care of the patient were reviewed by me and considered in my medical decision making (see chart for details).  Patient  presents to the emergency department for evaluation of headache.  She has had similar headaches in the recent past after her stroke.  She has baseline left-sided deficits.  Low suspicion for central cause for vertigo.  Suspect this is secondary to her migraine type headache.  Patient was given migraine cocktail and her headache symptoms improved.  Screening labs and CT head were obtained which showed no new findings.  Patient has had complete resolution of her headache and vertigo symptoms.  I do not see an indication for emergency MRI at this time.  Patient will call her neurologist tomorrow and move forward with her outpatient MRI as scheduled.    ____________________________________________  FINAL CLINICAL IMPRESSION(S) / ED DIAGNOSES  Final diagnoses:  Migraine without aura and without status migrainosus, not intractable     MEDICATIONS GIVEN DURING THIS VISIT:  Medications  sodium chloride 0.9 % bolus 500 mL (0 mLs Intravenous Stopped 11/11/17 2104)  ketorolac (TORADOL) 30 MG/ML injection 30 mg (30 mg Intravenous Given 11/11/17 1959)  metoCLOPramide (REGLAN) injection 10 mg (10 mg Intravenous Given 11/11/17 1955)  diphenhydrAMINE (BENADRYL) injection 50 mg (50 mg Intravenous Given 11/11/17 2002)     NEW OUTPATIENT MEDICATIONS STARTED DURING THIS VISIT:  Discharge Medication List as of 11/11/2017  8:54 PM    START taking these medications   Details  metoCLOPramide (REGLAN) 10 MG tablet Take 1 tablet (10 mg total) by mouth every 6 (six) hours as needed for nausea (and headache)., Starting Wed 11/11/2017, Print        Note:  This document was prepared using Dragon voice recognition software and may include unintentional dictation errors.  Nanda Quinton, MD Emergency Medicine    Long, W[facility], MD 11/12/17 1140

## 2017-11-11 NOTE — Telephone Encounter (Signed)
I had ordered an MRI but it is scheduled for the 14th.  If it is more severe and if she needs the MRI now, then she needs to go to the ED.  Also, they can acutely treat her intractable headache as well.

## 2017-11-11 NOTE — ED Notes (Signed)
ED Provider at bedside. 

## 2017-11-11 NOTE — Telephone Encounter (Signed)
Pt called office. Has had a headache for several days now. It has worsened in the last few days. She feels like everything is spinning. Feels different than previous headaches. Prednisone did work for a few days.  Should she try another abortive as discussed, or be evaluated in ED?

## 2017-11-11 NOTE — Telephone Encounter (Signed)
Called and LMOVM to advise Pt to proceed to ED

## 2017-11-11 NOTE — ED Triage Notes (Signed)
Patient presents via EMS with co headache onset yesterday; states her neurologist advised her to be seen in the ED. Denies nausea, vomiting, or photophobia. NAD noted.

## 2017-11-11 NOTE — ED Notes (Signed)
Patient transported to CT 

## 2017-11-11 NOTE — Discharge Instructions (Signed)

## 2017-11-12 ENCOUNTER — Telehealth: Payer: Self-pay

## 2017-11-12 MED FILL — METOCLOPRAMIDE 10 MG TABLET: 10 | 3 days supply | Qty: 15 | Fill #0

## 2017-11-12 NOTE — Telephone Encounter (Signed)
Rcvd after hours phone message. Pt called back at 6:10p, she had not gone to the ED as recommended by Dr Tomi Likens. Pt was again advisd to proceed to ED. Pt was evaluated in ED.  Called and spoke with Pt to determine how she feels today. Pt states she feels some better, and she has an Rx for Reglan from the ER, and questions if ok to fill. Advisd her to fill Reglan and take as directed. Pt has MRI scheduled tomorrow morning, advisd her we will contact her with results when they are released to [place]. Advisd Pt to rest and hydrate today as much as possible.

## 2017-11-13 ENCOUNTER — Ambulatory Visit
Admission: RE | Admit: 2017-11-13 | Discharge: 2017-11-13 | Disposition: A | Discharge status: Home / Self Care | Payer: Medicare Other | Source: Ambulatory Visit | Attending: Neurology | Admitting: Neurology

## 2017-11-13 ENCOUNTER — Telehealth: Payer: Self-pay | Admitting: Neurology

## 2017-11-13 DIAGNOSIS — R296 Repeated falls: Secondary | ICD-10-CM

## 2017-11-13 DIAGNOSIS — H538 Other visual disturbances: Secondary | ICD-10-CM | POA: Diagnosis not present

## 2017-11-13 DIAGNOSIS — R519 Headache, unspecified: Secondary | ICD-10-CM

## 2017-11-13 DIAGNOSIS — R51 Headache: Secondary | ICD-10-CM

## 2017-11-13 NOTE — Telephone Encounter (Signed)
-----   Message from Pieter Partridge, DO sent at 11/13/2017 12:17 PM EDT ----- MRI of brain reveals no new stroke, bleed or tumor.  It is stable when compared to prior MRI from last year.

## 2017-11-13 NOTE — Telephone Encounter (Signed)
Patient made aware of results.  

## 2017-12-04 ENCOUNTER — Ambulatory Visit: Payer: Medicare Other | Admitting: Nurse Practitioner

## 2017-12-04 ENCOUNTER — Ambulatory Visit (HOSPITAL_BASED_OUTPATIENT_CLINIC_OR_DEPARTMENT_OTHER): Payer: Medicare Other

## 2017-12-15 ENCOUNTER — Ambulatory Visit: Payer: Medicare Other | Attending: Nurse Practitioner | Admitting: Nurse Practitioner

## 2017-12-15 ENCOUNTER — Encounter: Payer: Self-pay | Admitting: Nurse Practitioner

## 2017-12-15 VITALS — BP 92/56 | HR 73 | Temp 99.1°F | Ht 66.0 in | Wt 194.4 lb

## 2017-12-15 DIAGNOSIS — IMO0002 Reserved for concepts with insufficient information to code with codable children: Secondary | ICD-10-CM

## 2017-12-15 DIAGNOSIS — M1990 Unspecified osteoarthritis, unspecified site: Secondary | ICD-10-CM | POA: Insufficient documentation

## 2017-12-15 DIAGNOSIS — E785 Hyperlipidemia, unspecified: Secondary | ICD-10-CM | POA: Diagnosis not present

## 2017-12-15 DIAGNOSIS — Z8673 Personal history of transient ischemic attack (TIA), and cerebral infarction without residual deficits: Secondary | ICD-10-CM

## 2017-12-15 DIAGNOSIS — R350 Frequency of micturition: Secondary | ICD-10-CM | POA: Diagnosis not present

## 2017-12-15 DIAGNOSIS — F419 Anxiety disorder, unspecified: Secondary | ICD-10-CM | POA: Diagnosis not present

## 2017-12-15 DIAGNOSIS — K2100 Gastro-esophageal reflux disease with esophagitis, without bleeding: Secondary | ICD-10-CM

## 2017-12-15 DIAGNOSIS — F329 Major depressive disorder, single episode, unspecified: Secondary | ICD-10-CM | POA: Insufficient documentation

## 2017-12-15 DIAGNOSIS — G43909 Migraine, unspecified, not intractable, without status migrainosus: Secondary | ICD-10-CM | POA: Insufficient documentation

## 2017-12-15 DIAGNOSIS — G629 Polyneuropathy, unspecified: Secondary | ICD-10-CM

## 2017-12-15 DIAGNOSIS — G4709 Other insomnia: Secondary | ICD-10-CM

## 2017-12-15 DIAGNOSIS — Z79899 Other long term (current) drug therapy: Secondary | ICD-10-CM | POA: Insufficient documentation

## 2017-12-15 DIAGNOSIS — R7303 Prediabetes: Secondary | ICD-10-CM

## 2017-12-15 DIAGNOSIS — I10 Essential (primary) hypertension: Secondary | ICD-10-CM | POA: Diagnosis not present

## 2017-12-15 DIAGNOSIS — G43709 Chronic migraine without aura, not intractable, without status migrainosus: Secondary | ICD-10-CM | POA: Diagnosis not present

## 2017-12-15 DIAGNOSIS — I63411 Cerebral infarction due to embolism of right middle cerebral artery: Secondary | ICD-10-CM | POA: Diagnosis not present

## 2017-12-15 DIAGNOSIS — G4700 Insomnia, unspecified: Secondary | ICD-10-CM | POA: Insufficient documentation

## 2017-12-15 DIAGNOSIS — Z886 Allergy status to analgesic agent status: Secondary | ICD-10-CM | POA: Insufficient documentation

## 2017-12-15 MED ORDER — AMLODIPINE BESYLATE 10 MG PO TABS: 10.0000 mg | ORAL_TABLET | Freq: Every day | ORAL | 1 refills | Status: DC

## 2017-12-15 MED ORDER — TOPIRAMATE 50 MG PO TABS: 50.0000 mg | ORAL_TABLET | Freq: Every day | ORAL | 1 refills | Status: DC

## 2017-12-15 MED ORDER — ATORVASTATIN CALCIUM 80 MG PO TABS: 80.0000 mg | ORAL_TABLET | Freq: Every day | ORAL | 3 refills | Status: DC

## 2017-12-15 MED ORDER — GABAPENTIN 300 MG PO CAPS: 300.0000 mg | ORAL_CAPSULE | Freq: Two times a day (BID) | ORAL | 2 refills | Status: DC

## 2017-12-15 MED ORDER — METOPROLOL TARTRATE 50 MG PO TABS: 50.0000 mg | ORAL_TABLET | Freq: Two times a day (BID) | ORAL | 5 refills | Status: DC

## 2017-12-15 MED ORDER — METOCLOPRAMIDE HCL 10 MG PO TABS: 10.0000 mg | ORAL_TABLET | Freq: Four times a day (QID) | ORAL | 2 refills | Status: DC | PRN

## 2017-12-15 MED ORDER — TRAZODONE HCL 100 MG PO TABS: 100.0000 mg | ORAL_TABLET | Freq: Every day | ORAL | 1 refills | Status: DC

## 2017-12-15 MED ORDER — PANTOPRAZOLE SODIUM 20 MG PO TBEC: 20.0000 mg | DELAYED_RELEASE_TABLET | Freq: Every day | ORAL | 1 refills | Status: DC

## 2017-12-15 MED ORDER — SPIRONOLACTONE 25 MG PO TABS: 25.0000 mg | ORAL_TABLET | Freq: Every day | ORAL | 1 refills | Status: DC

## 2017-12-15 MED ORDER — LISINOPRIL 40 MG PO TABS: 40.0000 mg | ORAL_TABLET | Freq: Every day | ORAL | 1 refills | Status: DC

## 2017-12-15 MED ORDER — CLOPIDOGREL BISULFATE 75 MG PO TABS: 75.0000 mg | ORAL_TABLET | Freq: Every day | ORAL | 3 refills | Status: DC

## 2017-12-15 MED ORDER — METOCLOPRAMIDE HCL 10 MG PO TABS: 10.0000 mg | ORAL_TABLET | Freq: Four times a day (QID) | ORAL | 0 refills | Status: DC | PRN

## 2017-12-15 MED ORDER — HYDRALAZINE HCL 50 MG PO TABS: ORAL_TABLET | ORAL | 5 refills | Status: DC

## 2017-12-15 MED FILL — AMLODIPINE BESYLATE 10 MG T: 10 | 30 days supply | Qty: 30 | Fill #0

## 2017-12-15 MED FILL — traZODone HCL 100 MG TABS: 100 | 30 days supply | Qty: 30 | Fill #0

## 2017-12-15 MED FILL — LISINOPRIL 40 MG TABLET: 40 | 30 days supply | Qty: 30 | Fill #0

## 2017-12-15 MED FILL — SPIRONOLACTONE 25 MG TABLET: 25 | 30 days supply | Qty: 30 | Fill #0

## 2017-12-15 MED FILL — METOCLOPRAMIDE 10 MG TABLET: 10 | 7 days supply | Qty: 30 | Fill #0

## 2017-12-15 MED FILL — ATORVASTATIN 80 MG TABLET: 80 | 30 days supply | Qty: 30 | Fill #0

## 2017-12-15 MED FILL — PANTOPRAZOLE SOD DR 20 MG T: 20 | 30 days supply | Qty: 30 | Fill #0

## 2017-12-15 MED FILL — TOPIRAMATE 50 MG TABLET: 50 | 30 days supply | Qty: 30 | Fill #0

## 2017-12-15 MED FILL — hydrALAZINE HCL 50 MG TABS: 50 | 30 days supply | Qty: 90 | Fill #0

## 2017-12-15 MED FILL — CLOPIDOGREL 75 MG TABLET: 75 | 30 days supply | Qty: 30 | Fill #0

## 2017-12-15 MED FILL — GABAPENTIN 300 MG CAPSULE: 300 | 30 days supply | Qty: 60 | Fill #0

## 2017-12-15 MED FILL — METOPROLOL TARTRATE 50 MG T: 50 | 30 days supply | Qty: 60 | Fill #0

## 2017-12-15 NOTE — Progress Notes (Signed)
Assessment & Plan:  Brandy Roberts was seen today for follow-up and knee pain.  Diagnoses and all orders for this visit:  Prediabetes -     Lipid panel -     Hemoglobin A1c Continue blood sugar control as discussed in office today, low carbohydrate diet, and regular physical exercise as tolerated, 150 minutes per week (30 min each day, 5 days per week, or 50 min 3 days per week). Keep blood sugar logs with fasting goal of 90-130 mg/dl, post prandial (after you eat) less than 180.  For Hypoglycemia: BS <60 and Hyperglycemia BS >400; contact the clinic ASAP. Annual eye exams and foot exams are recommended.   Other insomnia -     traZODone (DESYREL) 100 MG tablet; Take 1 tablet (100 mg total) by mouth at bedtime.  Essential hypertension -     amLODipine (NORVASC) 10 MG tablet; Take 1 tablet (10 mg total) by mouth daily. -     hydrALAZINE (APRESOLINE) 50 MG tablet; TAKE 1 TABLET BY MOUTH 3 TIMES DAILY WITH MEALS. -     lisinopril (PRINIVIL,ZESTRIL) 40 MG tablet; Take 1 tablet (40 mg total) by mouth daily. -     metoprolol tartrate (LOPRESSOR) 50 MG tablet; Take 1 tablet (50 mg total) by mouth 2 (two) times daily. -     spironolactone (ALDACTONE) 25 MG tablet; Take 1 tablet (25 mg total) by mouth daily. Continue all antihypertensives as prescribed.  Remember to bring in your blood pressure log with you for your follow up appointment.  DASH/Mediterranean Diets are healthier choices for HTN.   Dyslipidemia -     atorvastatin (LIPITOR) 80 MG tablet; Take 1 tablet (80 mg total) by mouth daily at 6 PM. INSTRUCTIONS: Work on a low fat, heart healthy diet and participate in regular aerobic exercise program by working out at least 150 minutes per week; 5 days a week-30 minutes per day. Avoid red meat, fried foods. junk foods, sodas, sugary drinks, unhealthy snacking, alcohol and smoking.  Drink at least 48oz of water per day and monitor your carbohydrate intake daily.  Lab Results  Component Value Date     LDLCALC 57 12/15/2017     History of CVA (cerebrovascular accident) without residual deficits -     clopidogrel (PLAVIX) 75 MG tablet; Take 1 tablet (75 mg total) by mouth daily.  Neuropathy -     gabapentin (NEURONTIN) 300 MG capsule; Take 1 capsule (300 mg total) by mouth 2 (two) times daily.  Gastroesophageal reflux disease with esophagitis -     pantoprazole (PROTONIX) 20 MG tablet; Take 1 tablet (20 mg total) by mouth daily.  Chronic migraine -     topiramate (TOPAMAX) 50 MG tablet; Take 1 tablet (50 mg total) by mouth daily.  Urinary frequency -     Urinalysis, Complete  Other orders -     metoCLOPramide (REGLAN) 10 MG tablet; Take 1 tablet (10 mg total) by mouth every 6 (six) hours as needed for nausea (and headache).    Patient has been counseled on age-appropriate routine health concerns for screening and prevention. These are reviewed and up-to-date. Referrals have been placed accordingly. Immunizations are up-to-date or declined.    Subjective:   Chief Complaint  Patient presents with  . Follow-up    Pt. is here fasting for labs and follow-up on insomnia.   . Knee Pain    Pt. stated she is having pain on her right knee.    HPI Brandy Roberts 47 y.o. female  presents to office today for follow up to insomnia, prediabetes, HTN and headaches. She isusing a cane today . She is using a cane today due to right knee pain. She last saw Ortho on 07-07-2017 for her right knee. Received a cortisone injection and was instructed to follow up if symptoms worsened or failed to improve.    Prediabetes Chronic. Stable. She denies any hypo symptoms. Endorses peripheral neuropathy. She is overdue for eye exam. She does not monitor her blood glucose levels at home. Endor Lab Results  Component Value Date   HGBA1C 5.8 (H) 12/15/2017   Insomnia She was started on Trazodone 100mg  at her last office visit with me on 11-04-2017. She was having difficulty falling asleep at that time.  Today She endorses significant improvement of sleep pattern with taking trazodone 100 mg.    Urinary Frequency She endorses increased frequency of urination at night. She has a personal care assistant/CNA assisting her at home. States she has been told that she needs a BSC as well as an order for increased HHA (home health aide) hours due to her right knee pain and difficulty performing ADLs. I instructed her that the facility will need to contact me regarding DME and orders for [facility].  [facility]  [phone]  CHRONIC HYPERTENSION Chronic. Stable.   Blood pressure range BP Readings from Last 3 Encounters:  12/15/17 (!) 92/56  11/11/17 134/69  11/04/17 130/71  Chest pain: no   Dyspnea: no   Claudication: no  Medication compliance: yes, taking metoprolol 25 mg BID, spironolactone 25 mg daily,  Lisinopril 40 mg daily, hydralazine 50 mg TID,  Medication Side Effects  Lightheadedness: no   Urinary frequency: no   Edema: no   Impotence: no  Preventitive Healthcare:  Exercise: no   Diet Pattern: diet: general  Salt Restriction:  no  Review of Systems  Constitutional: Negative for fever, malaise/fatigue and weight loss.  HENT: Negative.  Negative for nosebleeds.   Eyes: Negative.  Negative for blurred vision, double vision and photophobia.  Respiratory: Negative.  Negative for cough and shortness of breath.   Cardiovascular: Negative.  Negative for chest pain, palpitations and leg swelling.  Gastrointestinal: Positive for heartburn. Negative for nausea and vomiting.  Genitourinary: Positive for frequency.       Nocturia  Musculoskeletal: Positive for joint pain (right knee; primary OA). Negative for myalgias.  Neurological: Positive for sensory change (peripheral neuropathy) and headaches (chronic). Negative for dizziness, focal weakness and seizures.  Psychiatric/Behavioral: Negative for suicidal ideas. The patient has insomnia.     Past Medical History:  Diagnosis Date  . Anemia    . Anxiety   . Depression   . Hyperlipidemia   . Hypertension   . Migraines   . Osteoarthritis   . Sleep apnea   . Stroke [facility])     Past Surgical History:  Procedure Laterality Date  . CESAREAN SECTION    . GANGLION CYST EXCISION    . TEE WITHOUT CARDIOVERSION N/A 10/09/2014   Procedure: TRANSESOPHAGEAL ECHOCARDIOGRAM (TEE);  Surgeon: Sanda Klein, MD;  Location: [facility] ENDOSCOPY;  Service: Cardiovascular;  Laterality: N/A;  . TONSILLECTOMY      Family History  Problem Relation Age of Onset  . Cancer Mother        type unknown  . Hypertension Mother   . Hypertension Sister   . Diabetes Sister   . Cancer Maternal Aunt        4 aunts died of cancer types unknown  Social History Reviewed with no changes to be made today.   Outpatient Medications Prior to Visit  Medication Sig Dispense Refill  . venlafaxine XR (EFFEXOR-XR) 150 MG 24 hr capsule Take 1 capsule (150 mg total) daily with breakfast by mouth. 30 capsule 5  . venlafaxine XR (EFFEXOR-XR) 150 MG 24 hr capsule Take 1 capsule (150 mg total) by mouth daily with breakfast. 30 capsule 3  . amLODipine (NORVASC) 10 MG tablet Take 1 tablet (10 mg total) by mouth daily. 90 tablet 1  . atorvastatin (LIPITOR) 80 MG tablet Take 1 tablet (80 mg total) by mouth daily at 6 PM. 90 tablet 3  . clopidogrel (PLAVIX) 75 MG tablet Take 1 tablet (75 mg total) by mouth daily. 90 tablet 3  . gabapentin (NEURONTIN) 300 MG capsule Take 1 capsule (300 mg total) by mouth 2 (two) times daily. 60 capsule 2  . hydrALAZINE (APRESOLINE) 50 MG tablet TAKE 1 TABLET BY MOUTH 3 TIMES DAILY WITH MEALS. 90 tablet 5  . lisinopril (PRINIVIL,ZESTRIL) 40 MG tablet Take 1 tablet (40 mg total) by mouth daily. 90 tablet 1  . metoCLOPramide (REGLAN) 10 MG tablet Take 1 tablet (10 mg total) by mouth every 6 (six) hours as needed for nausea (and headache). 15 tablet 0  . metoprolol tartrate (LOPRESSOR) 50 MG tablet Take 1 tablet (50 mg total) by mouth 2 (two) times  daily. 60 tablet 5  . ondansetron (ZOFRAN) 4 MG tablet TAKE 1 TABLET (4 MG TOTAL) BY MOUTH EVERY 8 (EIGHT) HOURS AS NEEDED FOR NAUSEA OR VOMITING. 20 tablet 0  . pantoprazole (PROTONIX) 20 MG tablet Take 1 tablet (20 mg total) by mouth daily. 90 tablet 1  . spironolactone (ALDACTONE) 25 MG tablet Take 1 tablet (25 mg total) by mouth daily. 90 tablet 1  . topiramate (TOPAMAX) 50 MG tablet Take 1 tablet (50 mg total) by mouth daily. 90 tablet 1  . traZODone (DESYREL) 100 MG tablet Take 1 tablet (100 mg total) by mouth at bedtime. 30 tablet 1  . butalbital-acetaminophen-caffeine (FIORICET WITH CODEINE) 50-325-40-30 MG capsule Take 1 capsule by mouth every 6 (six) hours as needed.    . diclofenac sodium (VOLTAREN) 1 % GEL Apply 2 g topically 3 (three) times daily as needed (knee pain). (Patient not taking: Reported on 11/04/2017) 100 g 1  . ibuprofen (ADVIL,MOTRIN) 600 MG tablet Take 1 tablet (600 mg total) by mouth every 8 (eight) hours as needed. (Patient not taking: Reported on 10/19/2017) 20 tablet 0  . nicotine (NICODERM CQ - DOSED IN MG/24 HOURS) 21 mg/24hr patch Place 1 patch (21 mg total) onto the skin daily. (Patient not taking: Reported on 11/04/2017) 21 patch 2  . traMADol (ULTRAM) 50 MG tablet Take 1 tablet (50 mg total) by mouth every 8 (eight) hours as needed for severe pain. (Patient not taking: Reported on 10/19/2017) 30 tablet 0   No facility-administered medications prior to visit.     Allergies  Allergen Reactions  . Tylenol [Acetaminophen] Other (See Comments)    Pt stated tylenol gives her extreme headache       Objective:    BP (!) 92/56 (BP Location: Right Arm, Patient Position: Sitting, Cuff Size: Large)   Pulse 73   Temp 99.1 F (37.3 C) (Oral)   Ht 5\' 6"  (1.676 m)   Wt 194 lb 6.4 oz (88.2 kg)   SpO2 98%   BMI 31.38 kg/m  Wt Readings from Last 3 Encounters:  12/15/17 194 lb 6.4 oz (  88.2 kg)  11/11/17 200 lb (90.7 kg)  11/04/17 200 lb 9.6 oz (91 kg)    Physical  Exam  Constitutional: She is oriented to person, place, and time. She appears well-developed and well-nourished. She is cooperative.  HENT:  Head: Normocephalic and atraumatic.  Eyes: EOM are normal.  Neck: Normal range of motion.  Cardiovascular: Normal rate, regular rhythm and normal heart sounds. Exam reveals no gallop and no friction rub.  No murmur heard. Pulmonary/Chest: Effort normal and breath sounds normal. No tachypnea. No respiratory distress. She has no decreased breath sounds. She has no wheezes. She has no rhonchi. She has no rales. She exhibits no tenderness.  Abdominal: Bowel sounds are normal.  Musculoskeletal: She exhibits no edema, tenderness or deformity.       Right knee: She exhibits decreased range of motion.  Neurological: She is alert and oriented to person, place, and time. Coordination normal.  Skin: Skin is warm and dry.  Psychiatric: She has a normal mood and affect. Her behavior is normal. Judgment and thought content normal.  Nursing note and vitals reviewed.      Patient has been counseled extensively about nutrition and exercise as well as the importance of adherence with medications and regular follow-up. The patient was given clear instructions to go to ER or return to medical center if symptoms don't improve, worsen or new problems develop. The patient verbalized understanding.   Follow-up: Return in about 3 months (around 03/17/2018).   [name], FNP-BC [facility] [place], [place]   12/17/2017, 10:43 PM

## 2017-12-15 NOTE — Patient Instructions (Signed)
[facility] [address], [place] [address]

## 2017-12-16 LAB — LIPID PANEL
Chol/HDL Ratio: 2.6 ratio (ref 0.0–4.4)
Cholesterol, Total: 111 mg/dL (ref 100–199)
HDL: 42 mg/dL (ref >39)
LDL Calculated: 57 mg/dL (ref 0–99)
Triglycerides: 61 mg/dL (ref 0–149)
VLDL Cholesterol Cal: 12 mg/dL (ref 5–40)

## 2017-12-16 LAB — HEMOGLOBIN A1C
Est. average glucose Bld gHb Est-mCnc: 120 mg/dL
Hgb A1c MFr Bld: 5.8 % — ABNORMAL HIGH (ref 4.8–5.6)

## 2017-12-16 MED FILL — VENLAFAXINE HCL ER 150 MG C: 150 | 30 days supply | Qty: 30 | Fill #4

## 2017-12-17 ENCOUNTER — Encounter: Payer: Self-pay | Admitting: Nurse Practitioner

## 2017-12-18 ENCOUNTER — Telehealth: Payer: Self-pay | Admitting: Nurse Practitioner

## 2017-12-18 DIAGNOSIS — R350 Frequency of micturition: Secondary | ICD-10-CM | POA: Diagnosis not present

## 2017-12-18 NOTE — Telephone Encounter (Addendum)
Brandy Roberts from Home health was returning a call to PCP, please follow up when possible. [phone]

## 2017-12-19 LAB — MICROSCOPIC EXAMINATION
Casts: NONE SEEN /lpf
RBC, UA: NONE SEEN /hpf (ref 0–2)

## 2017-12-19 LAB — URINALYSIS, COMPLETE
Bilirubin, UA: NEGATIVE
Glucose, UA: NEGATIVE
KETONES UA: NEGATIVE
NITRITE UA: NEGATIVE
PH UA: 6 (ref 5.0–7.5)
RBC, UA: NEGATIVE
SPEC GRAV UA: 1.023 (ref 1.005–1.030)
Urobilinogen, Ur: 0.2 mg/dL (ref 0.2–1.0)

## 2017-12-20 ENCOUNTER — Other Ambulatory Visit: Payer: Self-pay | Admitting: Nurse Practitioner

## 2017-12-20 MED ORDER — NITROFURANTOIN MONOHYD MACRO 100 MG PO CAPS: 100.0000 mg | ORAL_CAPSULE | Freq: Two times a day (BID) | ORAL | 0 refills | Status: AC

## 2017-12-21 ENCOUNTER — Telehealth: Payer: Self-pay | Admitting: Nurse Practitioner

## 2017-12-21 ENCOUNTER — Telehealth: Payer: Self-pay

## 2017-12-21 NOTE — Telephone Encounter (Signed)
Noted. Thank You.

## 2017-12-21 NOTE — Telephone Encounter (Signed)
CMA attempt to call patient to inform on lab results.  No answer and left a VM for patient to call back.  If patient call back please inform:  A1c increased to 5.8. However cholesterol levels are normal.  INSTRUCTIONS: Work on a low fat, heart healthy diet and participate in regular aerobic exercise program by working out at least 150 minutes per week; 5 days a week-30 minutes per day. Avoid red meat, fried foods. junk foods, sodas, sugary drinks, unhealthy snacking, alcohol and smoking.  Drink at least 48oz of water per day and monitor your carbohydrate intake daily.  Urinalysis positive for UTI. Will send antibiotic to pharmacy  A letter will be send out to patient.

## 2017-12-21 NOTE — Telephone Encounter (Signed)
-----   Message from [name], NP sent at 12/16/2017  5:34 PM EDT ----- A1c increased to 5.8. However cholesterol levels are normal.  INSTRUCTIONS: Work on a low fat, heart healthy diet and participate in regular aerobic exercise program by working out at least 150 minutes per week; 5 days a week-30 minutes per day. Avoid red meat, fried foods. junk foods, sodas, sugary drinks, unhealthy snacking, alcohol and smoking.  Drink at least 48oz of water per day and monitor your carbohydrate intake daily.

## 2017-12-21 NOTE — Telephone Encounter (Signed)
A1c increased to 5.8. However cholesterol levels are normal. INSTRUCTIONS: Work on a low fat, heart healthy diet and participate in regular aerobic exercise program by working out at least 150 minutes per week; 5 days a week-30 minutes per day. Avoid red meat, fried foods. junk foods, sodas, sugary drinks, unhealthy snacking, alcohol and smoking. Drink at least 48oz of water per day and monitor your carbohydrate intake daily.  Urinalysis positive for UTI. Will send antibiotic to pharmacy  Patient called back and I informed her of the above message.

## 2017-12-21 NOTE — Telephone Encounter (Signed)
-----   Message from [name], NP sent at 12/20/2017  8:45 PM EDT ----- Urinalysis positive for UTI. Will send antibiotic to pharmacy

## 2018-01-01 ENCOUNTER — Ambulatory Visit: Payer: Medicare Other | Admitting: Nurse Practitioner

## 2018-02-03 DIAGNOSIS — M25561 Pain in right knee: Secondary | ICD-10-CM | POA: Diagnosis not present

## 2018-02-05 ENCOUNTER — Telehealth: Payer: Self-pay | Admitting: Nurse Practitioner

## 2018-02-05 NOTE — Telephone Encounter (Signed)
Ailene Ravel from [place] called to get verbal authorization for patient to be seen. Please follow up with [place].

## 2018-02-08 NOTE — Telephone Encounter (Signed)
CMA attempt to call back [place] to ask what is the verbal orders need for patient. No answer and was unable to get any response.

## 2018-02-18 NOTE — Telephone Encounter (Signed)
Brandy Roberts from [place] called for verbal orders for the patients medicaid. Please follow up.

## 2018-02-24 DIAGNOSIS — M25561 Pain in right knee: Secondary | ICD-10-CM | POA: Diagnosis not present

## 2018-03-03 MED FILL — hydrALAZINE HCL 50 MG TABS: 50 | 30 days supply | Qty: 90 | Fill #1

## 2018-03-03 MED FILL — SPIRONOLACTONE 25 MG TABLET: 25 | 30 days supply | Qty: 30 | Fill #1

## 2018-03-03 MED FILL — VENLAFAXINE HCL ER 150 MG C: 150 | 30 days supply | Qty: 30 | Fill #5

## 2018-03-03 MED FILL — PANTOPRAZOLE SOD DR 20 MG T: 20 | 30 days supply | Qty: 30 | Fill #1

## 2018-03-03 MED FILL — TOPIRAMATE 50 MG TABLET: 50 | 30 days supply | Qty: 30 | Fill #1

## 2018-03-03 MED FILL — METOPROLOL TARTRATE 50 MG T: 50 | 30 days supply | Qty: 60 | Fill #1

## 2018-03-03 MED FILL — ATORVASTATIN 80 MG TABLET: 80 | 30 days supply | Qty: 30 | Fill #1

## 2018-03-03 MED FILL — AMLODIPINE BESYLATE 10 MG T: 10 | 30 days supply | Qty: 30 | Fill #1

## 2018-03-03 MED FILL — CLOPIDOGREL 75 MG TABLET: 75 | 30 days supply | Qty: 30 | Fill #1

## 2018-03-03 MED FILL — GABAPENTIN 300 MG CAPSULE: 300 | 30 days supply | Qty: 60 | Fill #1

## 2018-03-03 MED FILL — LISINOPRIL 40 MG TABLET: 40 | 30 days supply | Qty: 30 | Fill #1

## 2018-03-08 DIAGNOSIS — S83281A Other tear of lateral meniscus, current injury, right knee, initial encounter: Secondary | ICD-10-CM | POA: Diagnosis not present

## 2018-03-08 DIAGNOSIS — M25561 Pain in right knee: Secondary | ICD-10-CM | POA: Diagnosis not present

## 2018-03-23 ENCOUNTER — Ambulatory Visit: Payer: Medicare Other | Admitting: Nurse Practitioner

## 2018-03-25 ENCOUNTER — Ambulatory Visit: Payer: Medicare Other | Attending: Family Medicine | Admitting: Physical Therapy

## 2018-03-25 ENCOUNTER — Encounter: Payer: Self-pay | Admitting: Physical Therapy

## 2018-03-25 ENCOUNTER — Other Ambulatory Visit: Payer: Self-pay

## 2018-03-25 DIAGNOSIS — R29898 Other symptoms and signs involving the musculoskeletal system: Secondary | ICD-10-CM | POA: Diagnosis not present

## 2018-03-25 DIAGNOSIS — M25561 Pain in right knee: Secondary | ICD-10-CM | POA: Diagnosis not present

## 2018-03-25 DIAGNOSIS — R261 Paralytic gait: Secondary | ICD-10-CM | POA: Insufficient documentation

## 2018-03-25 DIAGNOSIS — G8929 Other chronic pain: Secondary | ICD-10-CM | POA: Insufficient documentation

## 2018-03-25 DIAGNOSIS — R2681 Unsteadiness on feet: Secondary | ICD-10-CM | POA: Diagnosis not present

## 2018-03-25 DIAGNOSIS — R262 Difficulty in walking, not elsewhere classified: Secondary | ICD-10-CM | POA: Diagnosis not present

## 2018-03-25 NOTE — Therapy (Signed)
[facility] [address]  [place], [place], [address] Phone: [phone]   Fax:  [phone]  Physical Therapy Evaluation  Patient Details  Name: Brandy Roberts MRN: [id number] Date of Birth: [birth year] Referring Provider (PT): Rhina Brackett, MD   Encounter Date: 03/25/2018  PT End of Session - 03/25/18 1203    Visit Number  1    Number of Visits  17    Date for PT Re-Evaluation  05/20/18    Authorization Type  Medicare & Medicaid    PT Start Time  0160    PT Stop Time  1100    PT Time Calculation (min)  45 min    Activity Tolerance  Patient tolerated treatment well    Behavior During Therapy  [facility] for tasks assessed/performed       Past Medical History:  Diagnosis Date  . Anemia   . Anxiety   . Depression   . Hyperlipidemia   . Hypertension   . Migraines   . Osteoarthritis   . Sleep apnea   . Stroke [facility])     Past Surgical History:  Procedure Laterality Date  . CESAREAN SECTION    . GANGLION CYST EXCISION    . TEE WITHOUT CARDIOVERSION N/A 10/09/2014   Procedure: TRANSESOPHAGEAL ECHOCARDIOGRAM (TEE);  Surgeon: Sanda Klein, MD;  Location: [facility] ENDOSCOPY;  Service: Cardiovascular;  Laterality: N/A;  . TONSILLECTOMY      There were no vitals filed for this visit.   Subjective Assessment - 03/25/18 1016    Subjective  Patient reports R knee pain started about 2 years ago- woke up one morning and had pain. Cannot recall causative event. Pain localized to R lateral knee with intermittent N/T and radiation of pain down lateral lower leg. Aggravating factors include: standing, walking, stairs, getting in/out of bath tub. Patient afraid of elevators and has to take stairs. Had a stroke 3 years ago that affected L side UE and LE. Currently walking with quad cane or 4WW at all times. Has an aid that comes to help her with cleaning, bathing.  MD suggested surgery or shots to help with knee pain, patient  wanting to avoid these measures.    Pertinent History  stroke, OA, migraines, HTN, HLD, depression, anxiety, anemia    Limitations  Sitting;House hold activities;Walking;Standing;Lifting    How long can you sit comfortably?  15-20 min    How long can you stand comfortably?  20 min    How long can you walk comfortably?  5 min    Diagnostic tests  02/24/18 R knee MRI: degenerative tearing og the anterior horn of lateral meniscus, moderate OA, questionable 48mm free chondral fragment, small baker's cyst    Patient Stated Goals  "i want to get this pain right in my knee"    Currently in Pain?  Yes    Pain Score  7     Pain Location  Knee    Pain Orientation  Right;Lateral    Pain Descriptors / Indicators  Aching    Pain Type  Chronic pain    Pain Radiating Towards  down lateral lower leg         [facility] PT Assessment - 03/25/18 1031      Assessment   Medical Diagnosis  R knee primary OA    Referring Provider (PT)  Rhina Brackett, MD    Onset Date/Surgical Date  03/25/16    Next MD Visit  --   patient  unsure   Prior Therapy  Yes- for stroke      Precautions   Precautions  None      Restrictions   Weight Bearing Restrictions  No      Balance Screen   Has the patient fallen in the past 6 months  Yes    How many times?  1   tripped over carpet- no injuries   Has the patient had a decrease in activity level because of a fear of falling?   No    Is the patient reluctant to leave their home because of a fear of falling?   No      Home Environment   Living Environment  Private residence    Living Arrangements  Children    Available Help at Discharge  Family;Personal care attendant    Type of [place] Access  Level entry    [place]  One level    [place] - 4 wheels      Prior Function   Level of Independence  Independent with household mobility with device;Needs assistance with ADLs    Vocation  On disability    Leisure  getting  out, planting in the garden      Cognition   Overall Cognitive Status  Within Functional Limits for tasks assessed      Observation/Other Assessments   Focus on Therapeutic Outcomes (FOTO)   Knee: 26 (74% limited, 56% limited)      Sensation   Light Touch  Impaired by gross assessment   per patient- entire L side N/T     Coordination   Gross Motor Movements are Fluid and Coordinated  No   slightly segmented movements     Posture/Postural Control   Posture/Postural Control  Postural limitations    Postural Limitations  Rounded Shoulders;Forward head;Posterior pelvic tilt      ROM / Strength   AROM / PROM / Strength  AROM;PROM;Strength      AROM   AROM Assessment Site  Knee    Right/Left Knee  Right;Left    Right Knee Extension  0    Right Knee Flexion  110    Left Knee Extension  0    Left Knee Flexion  20   secondary to hemi-weakness     PROM   PROM Assessment Site  Knee    Right/Left Knee  Right;Left    Right Knee Extension  0   pain in R knee   Right Knee Flexion  121    Left Knee Extension  0    Left Knee Flexion  130      Strength   Strength Assessment Site  Hip;Knee;Ankle    Right/Left Hip  Right;Left    Right Hip Flexion  4-/5    Right Hip ABduction  4/5    Right Hip ADduction  4-/5    Left Hip Flexion  3-/5    Left Hip ABduction  3+/5    Left Hip ADduction  3-/5    Right/Left Knee  Right;Left    Right Knee Flexion  3+/5    Right Knee Extension  3+/5    Left Knee Flexion  2/5    Left Knee Extension  2/5    Right/Left Ankle  Right;Left    Right Ankle Dorsiflexion  4/5    Right Ankle Plantar Flexion  4/5    Left Ankle Dorsiflexion  3/5    Left Ankle Plantar  Flexion  3+/5      Flexibility   Soft Tissue Assessment /Muscle Length  yes    Hamstrings  mild tightness B LEs    Quadriceps  mild tightness R LE      Palpation   Patella mobility  R patellar mobility WFL    Palpation comment  severely TTP and moderate edema in R lateral joint line       Ambulation/Gait   Assistive device  Large base quad cane    Gait Pattern  Step-to pattern;Step-through pattern;Decreased hip/knee flexion - right;Decreased hip/knee flexion - left;Decreased weight shift to left;Narrow base of support;Poor foot clearance - right;Poor foot clearance - left    Ambulation Surface  Level;Indoor    Gait velocity  decreased                Objective measurements completed on examination: See above findings.              PT Education - 03/25/18 1203    Education Details  prognosis, POC, HEP    Person(s) Educated  Patient    Methods  Explanation;Demonstration;Tactile cues;Verbal cues;Handout    Comprehension  Verbalized understanding;Returned demonstration       PT Short Term Goals - 03/25/18 1212      PT SHORT TERM GOAL #1   Title  Patient to be independnent with initial HEP.    Time  4    Period  Weeks    Status  New    Target Date  04/22/18      PT SHORT TERM GOAL #2   Title  --        PT Long Term Goals - 03/25/18 1213      PT LONG TERM GOAL #1   Title  Patient to be independent with advanced HEP.    Time  8    Period  Weeks    Status  New    Target Date  05/20/18      PT LONG TERM GOAL #2   Title  Patient to demonstrate >=4+/5 strength in R LE and >=3+/5 strength in L LE.     Time  8    Period  Weeks    Status  New    Target Date  05/20/18      PT LONG TERM GOAL #3   Title  Patient to report tolerance of 15 min of walking without pain limiting.    Time  8    Period  Weeks    Status  New    Target Date  05/20/18      PT LONG TERM GOAL #4   Title  Patient to score >44/56 on Berg balance test with use of quad cane to demonstrate decreased fall risk.     Time  8    Period  Weeks    Status  New    Target Date  05/20/18      PT LONG TERM GOAL #5   Title  Patient to demonstrate stair climbing up/down 13 steps with 1 handrail and quad cane with good stability and safe sequencing.     Time  8    Period  Weeks     Status  New    Target Date  05/20/18             Plan - 03/25/18 1204    Clinical Impression Statement  Patient is a 47y/o F with hx of CVA with L hemi-body weakness presenting to OPPT with  c/o R knee pain. MRI results show lateral meniscal tear, arthritic changes, baker's cyst, and free chondral fragment. Reports pain starts in R lateral joint line with radiation of pain and N/T along R lateral lower leg. Aggravating factors include: standing, walking, stairs, getting in/out of bath tub. Patient ambulates with quad cane or 4WW with considerable gait deviations and instability secondary to stroke. Also with marked weakness in B LEs, good overall knee ROM, decreased flexibility, and impaired posture. Educated on and received handout on gentle strengthening HEP. Advised patient to perform standing exercise with counter support and chair behind to decrease risk of falls. Patient reported understanding. Would benefit from skilled PT services 2x/week for 8 weeks to address aforementioned impairments.     Clinical Presentation  Stable    Clinical Decision Making  Low    Rehab Potential  Good    Clinical Impairments Affecting Rehab Potential  stroke, OA, migraines, HTN, HLD, depression, anxiety, anemia    PT Frequency  2x / week    PT Duration  8 weeks    PT Treatment/Interventions  ADLs/Self Care Home Management;Cryotherapy;Functional mobility training;Electrical Stimulation;Iontophoresis 4mg /ml Dexamethasone;Moist Heat;Ultrasound;DME Instruction;Gait training;Stair training;Therapeutic activities;Therapeutic exercise;Manual techniques;Orthotic Fit/Training;Patient/family education;Balance training;Neuromuscular re-education;Passive range of motion;Dry needling;Energy conservation;Splinting;Taping;Vasopneumatic Device    PT Next Visit Plan  reassess HEP; provide info on AFO; assess Berg balance test    Consulted and Agree with Plan of Care  Patient       Patient will benefit from skilled  therapeutic intervention in order to improve the following deficits and impairments:  Abnormal gait, Decreased endurance, Impaired tone, Decreased activity tolerance, Decreased strength, Pain, Difficulty walking, Decreased mobility, Decreased balance, Decreased range of motion, Postural dysfunction, Impaired flexibility, Decreased coordination, Decreased safety awareness  Visit Diagnosis: Chronic pain of right knee  Difficulty in walking, not elsewhere classified  Other symptoms and signs involving the musculoskeletal system  Unsteadiness on feet  Paralytic gait     Problem List Patient Active Problem List   Diagnosis Date Noted  . CVA (cerebral vascular accident) ([place]) 10/25/2016  . Post-menopausal bleeding 09/24/2016  . OSA (obstructive sleep apnea) 09/03/2015  . Heart valve vegetation 09/03/2015  . Snoring 05/02/2015  . Essential hypertension 05/02/2015  . Tobacco use disorder 05/02/2015  . Headache 05/01/2015  . Dysphagia, post-stroke 02/20/2015  . Former smoker 01/24/2015  . Alteration of sensations, post-stroke 12/22/2014  . Cognitive deficit, post-stroke 11/23/2014  . Left hemiparesis ([place]) 10/23/2014  . Cardiomyopathy due to hypertension ([place]) 10/23/2014  . Chronic ischemic vertebrobasilar artery thalamic stroke   . Aortic valve vegetation   . Hypertensive heart disease   . Overweight (BMI 25.0-29.9)   . H/O noncompliance with medical treatment, presenting hazards to health   . Aortic regurgitation   . Cerebral infarction due to thrombosis of right middle cerebral artery ([place])   . Hyperlipidemia   . Embolic stroke involving right middle cerebral artery ([place]) 10/06/2014    Janene Harvey, PT, DPT 03/25/18 12:22 PM   [facility] [address]  [place], [place], [address] Phone: [phone]   Fax:  [phone]  Name: Brandy Roberts MRN: [id number] Date of Birth: Aug 27, 1970

## 2018-04-01 ENCOUNTER — Ambulatory Visit: Payer: Medicare Other

## 2018-04-05 ENCOUNTER — Ambulatory Visit: Payer: Medicare Other | Attending: Family Medicine

## 2018-04-05 DIAGNOSIS — R261 Paralytic gait: Secondary | ICD-10-CM | POA: Diagnosis not present

## 2018-04-05 DIAGNOSIS — R262 Difficulty in walking, not elsewhere classified: Secondary | ICD-10-CM | POA: Insufficient documentation

## 2018-04-05 DIAGNOSIS — R2681 Unsteadiness on feet: Secondary | ICD-10-CM | POA: Insufficient documentation

## 2018-04-05 DIAGNOSIS — M25561 Pain in right knee: Secondary | ICD-10-CM | POA: Insufficient documentation

## 2018-04-05 DIAGNOSIS — R29898 Other symptoms and signs involving the musculoskeletal system: Secondary | ICD-10-CM | POA: Insufficient documentation

## 2018-04-05 DIAGNOSIS — G8929 Other chronic pain: Secondary | ICD-10-CM | POA: Diagnosis not present

## 2018-04-05 NOTE — Therapy (Signed)
[facility] [address]  [place], [place], [address] Phone: [phone]   Fax:  [phone]  Physical Therapy Treatment  Patient Details  Name: Brandy Roberts MRN: [id number] Date of Birth: Jan 13, 1971 Referring Provider (PT): Rhina Brackett, MD   Encounter Date: 04/05/2018  PT End of Session - 04/05/18 1110    Visit Number  2    Number of Visits  17    Date for PT Re-Evaluation  05/20/18    Authorization Type  Medicare & Medicaid    PT Start Time  1104    PT Stop Time  1150    PT Time Calculation (min)  46 min    Activity Tolerance  Patient tolerated treatment well    Behavior During Therapy  [facility] for tasks assessed/performed       Past Medical History:  Diagnosis Date  . Anemia   . Anxiety   . Depression   . Hyperlipidemia   . Hypertension   . Migraines   . Osteoarthritis   . Sleep apnea   . Stroke [facility])     Past Surgical History:  Procedure Laterality Date  . CESAREAN SECTION    . GANGLION CYST EXCISION    . TEE WITHOUT CARDIOVERSION N/A 10/09/2014   Procedure: TRANSESOPHAGEAL ECHOCARDIOGRAM (TEE);  Surgeon: Sanda Klein, MD;  Location: [facility] ENDOSCOPY;  Service: Cardiovascular;  Laterality: N/A;  . TONSILLECTOMY      There were no vitals filed for this visit.  Subjective Assessment - 04/05/18 1122    Subjective  Pt. reporting her knee is hurting worse today.      Pertinent History  stroke, OA, migraines, HTN, HLD, depression, anxiety, anemia    Diagnostic tests  02/24/18 R knee MRI: degenerative tearing og the anterior horn of lateral meniscus, moderate OA, questionable 44mm free chondral fragment, small baker's cyst    Patient Stated Goals  "i want to get this pain right in my knee"    Currently in Pain?  Yes    Pain Score  8     Pain Location  Knee    Pain Orientation  Right;Lateral    Pain Descriptors / Indicators  Stabbing;Aching    Pain Type  Chronic pain         [facility] PT  Assessment - 04/05/18 1126      Standardized Balance Assessment   Standardized Balance Assessment  Berg Balance Test    Balance Master Testing  Limits of Stability      Berg Balance Test   Sit to Stand  Able to stand without using hands and stabilize independently    Standing Unsupported  Able to stand safely 2 minutes    Sitting with Back Unsupported but Feet Supported on Floor or Stool  Able to sit safely and securely 2 minutes    Stand to Sit  Sits safely with minimal use of hands    Transfers  Able to transfer safely, minor use of hands    Standing Unsupported with Eyes Closed  Able to stand 10 seconds safely    Standing Ubsupported with Feet Together  Able to place feet together independently and stand 1 minute safely    From Standing, Reach Forward with Outstretched Arm  Can reach forward >12 cm safely (5")    From Standing Position, Pick up Object from Floor  Able to pick up shoe safely and easily    From Standing Position, Turn to Look Behind Over each Shoulder  Looks behind one side only/other side shows less weight shift    Turn 360 Degrees  Able to turn 360 degrees safely but slowly    Standing Unsupported, Alternately Place Feet on Step/Stool  Able to stand independently and complete 8 steps >20 seconds    Standing Unsupported, One Foot in Front  Able to place foot tandem independently and hold 30 seconds    Standing on One Leg  Able to lift leg independently and hold equal to or more than 3 seconds    Total Score  49                   [facility] Adult PT Treatment/Exercise - 04/05/18 1124      Knee/Hip Exercises: Stretches   Passive Hamstring Stretch  Right;Left;1 rep;30 seconds    Passive Hamstring Stretch Limitations  strap       Knee/Hip Exercises: Aerobic   Nustep  Lvl 1, 8 min       Knee/Hip Exercises: Standing   Heel Raises  Both;15 reps      Knee/Hip Exercises: Supine   Quad Sets  Right;15 reps    Quad Sets Limitations  5" hold     Bridges  Both;10  reps               PT Short Term Goals - 04/05/18 1111      PT SHORT TERM GOAL #1   Title  Patient to be independnent with initial HEP.    Time  4    Period  Weeks    Status  On-going        PT Long Term Goals - 04/05/18 1112      PT LONG TERM GOAL #1   Title  Patient to be independent with advanced HEP.    Time  8    Period  Weeks    Status  On-going      PT LONG TERM GOAL #2   Title  Patient to demonstrate >=4+/5 strength in R LE and >=3+/5 strength in L LE.     Time  8    Period  Weeks    Status  On-going      PT LONG TERM GOAL #3   Title  Patient to report tolerance of 15 min of walking without pain limiting.    Time  8    Period  Weeks    Status  On-going      PT LONG TERM GOAL #4   Title  Patient to score >44/56 on Berg balance test with use of quad cane to demonstrate decreased fall risk.     Time  8    Period  Weeks    Status  On-going      PT LONG TERM GOAL #5   Title  Patient to demonstrate stair climbing up/down 13 steps with 1 handrail and quad cane with good stability and safe sequencing.     Time  8    Period  Weeks    Status  On-going            Plan - 04/05/18 1202    Clinical Impression Statement  Pt. reporting R knee pain somewhat worse today without known trigger.  Seen wearing supportive brace on R knee.  Pt. demonstrating fall risk with BERG Balance Testing today with score of 49/56 showing most difficulty with SLS activities and turning.  HEP reviewed with pt. noting difficulty with quad set however able to tolerate following review.  Will plan to monitor response to today's activities and update HEP accordingly in coming visit.      Clinical Impairments Affecting Rehab Potential  stroke, OA, migraines, HTN, HLD, depression, anxiety, anemia    PT Treatment/Interventions  ADLs/Self Care Home Management;Cryotherapy;Functional mobility training;Electrical Stimulation;Iontophoresis 4mg /ml Dexamethasone;Moist Heat;Ultrasound;DME  Instruction;Gait training;Stair training;Therapeutic activities;Therapeutic exercise;Manual techniques;Orthotic Fit/Training;Patient/family education;Balance training;Neuromuscular re-education;Passive range of motion;Dry needling;Energy conservation;Splinting;Taping;Vasopneumatic Device    PT Next Visit Plan  provide info on AFO    Consulted and Agree with Plan of Care  Patient       Patient will benefit from skilled therapeutic intervention in order to improve the following deficits and impairments:  Abnormal gait, Decreased endurance, Impaired tone, Decreased activity tolerance, Decreased strength, Pain, Difficulty walking, Decreased mobility, Decreased balance, Decreased range of motion, Postural dysfunction, Impaired flexibility, Decreased coordination, Decreased safety awareness  Visit Diagnosis: Chronic pain of right knee  Difficulty in walking, not elsewhere classified  Other symptoms and signs involving the musculoskeletal system  Unsteadiness on feet  Paralytic gait     Problem List Patient Active Problem List   Diagnosis Date Noted  . CVA (cerebral vascular accident) ([place]) 10/25/2016  . Post-menopausal bleeding 09/24/2016  . OSA (obstructive sleep apnea) 09/03/2015  . Heart valve vegetation 09/03/2015  . Snoring 05/02/2015  . Essential hypertension 05/02/2015  . Tobacco use disorder 05/02/2015  . Headache 05/01/2015  . Dysphagia, post-stroke 02/20/2015  . Former smoker 01/24/2015  . Alteration of sensations, post-stroke 12/22/2014  . Cognitive deficit, post-stroke 11/23/2014  . Left hemiparesis ([place]) 10/23/2014  . Cardiomyopathy due to hypertension ([place]) 10/23/2014  . Chronic ischemic vertebrobasilar artery thalamic stroke   . Aortic valve vegetation   . Hypertensive heart disease   . Overweight (BMI 25.0-29.9)   . H/O noncompliance with medical treatment, presenting hazards to health   . Aortic regurgitation   . Cerebral infarction due to thrombosis of right  middle cerebral artery ([place])   . Hyperlipidemia   . Embolic stroke involving right middle cerebral artery ([place]) 10/06/2014    Bess Harvest, PTA 04/05/18 12:18 PM   [facility] [address]  [place], [place], [address] Phone: [phone]   Fax:  [phone]  Name: Brandy Roberts MRN: [id number] Date of Birth: 08/24/1970

## 2018-04-12 ENCOUNTER — Encounter: Payer: Medicare Other | Admitting: Physical Therapy

## 2018-04-13 ENCOUNTER — Encounter

## 2018-04-13 ENCOUNTER — Ambulatory Visit: Payer: Medicare Other | Admitting: Nurse Practitioner

## 2018-04-15 ENCOUNTER — Ambulatory Visit: Payer: Medicare Other

## 2018-04-15 DIAGNOSIS — R262 Difficulty in walking, not elsewhere classified: Secondary | ICD-10-CM | POA: Diagnosis not present

## 2018-04-15 DIAGNOSIS — G8929 Other chronic pain: Secondary | ICD-10-CM | POA: Diagnosis not present

## 2018-04-15 DIAGNOSIS — R261 Paralytic gait: Secondary | ICD-10-CM | POA: Diagnosis not present

## 2018-04-15 DIAGNOSIS — M25561 Pain in right knee: Secondary | ICD-10-CM | POA: Diagnosis not present

## 2018-04-15 DIAGNOSIS — R2681 Unsteadiness on feet: Secondary | ICD-10-CM

## 2018-04-15 DIAGNOSIS — R29898 Other symptoms and signs involving the musculoskeletal system: Secondary | ICD-10-CM | POA: Diagnosis not present

## 2018-04-15 NOTE — Therapy (Signed)
[facility] [address]  [place], [place], [address] Phone: [phone]   Fax:  [phone]  Physical Therapy Treatment  Patient Details  Name: Brandy Roberts MRN: [id number] Date of Birth: 03-09-1971 Referring Provider (PT): Rhina Brackett, MD   Encounter Date: 04/15/2018  PT End of Session - 04/15/18 0948    Visit Number  3    Number of Visits  17    Date for PT Re-Evaluation  05/20/18    Authorization Type  Medicare & Medicaid    PT Start Time  0937    PT Stop Time  1015    PT Time Calculation (min)  38 min    Activity Tolerance  Patient tolerated treatment well    Behavior During Therapy  [facility] for tasks assessed/performed       Past Medical History:  Diagnosis Date  . Anemia   . Anxiety   . Depression   . Hyperlipidemia   . Hypertension   . Migraines   . Osteoarthritis   . Sleep apnea   . Stroke [facility])     Past Surgical History:  Procedure Laterality Date  . CESAREAN SECTION    . GANGLION CYST EXCISION    . TEE WITHOUT CARDIOVERSION N/A 10/09/2014   Procedure: TRANSESOPHAGEAL ECHOCARDIOGRAM (TEE);  Surgeon: Sanda Klein, MD;  Location: [facility] ENDOSCOPY;  Service: Cardiovascular;  Laterality: N/A;  . TONSILLECTOMY      There were no vitals filed for this visit.  Subjective Assessment - 04/15/18 0948    Subjective  Pt. denies pain today.      Pertinent History  stroke, OA, migraines, HTN, HLD, depression, anxiety, anemia    Diagnostic tests  02/24/18 R knee MRI: degenerative tearing og the anterior horn of lateral meniscus, moderate OA, questionable 69mm free chondral fragment, small baker's cyst    Patient Stated Goals  "i want to get this pain right in my knee"    Currently in Pain?  No/denies    Pain Score  0-No pain   up to 10/10 R knee pain without known trigger at night   Pain Location  Knee    Pain Orientation  Right;Lateral    Pain Type  Chronic pain                        [facility] Adult PT Treatment/Exercise - 04/15/18 0950      Knee/Hip Exercises: Stretches   Passive Hamstring Stretch  Right;1 rep;30 seconds    Passive Hamstring Stretch Limitations  strap     Piriformis Stretch  Right;2 reps;30 seconds    Piriformis Stretch Limitations  KTOS with towel behind knee      Knee/Hip Exercises: Aerobic   Recumbent Bike  lvl 1, 7 min - full revolutions       Knee/Hip Exercises: Standing   Heel Raises  Both;20 reps    Heel Raises Limitations  at chair    Knee Flexion  Right;Left;10 reps    Knee Flexion Limitations  at chair    Hip Flexion  Right;Left;10 reps    Hip Flexion Limitations  chair      Knee/Hip Exercises: Supine   Bridges with Ball Squeeze  Both;Strengthening   x 12 reps    Straight Leg Raises  Right;10 reps    Straight Leg Raises Limitations  Cues for quad set prior to each rep    Knee Flexion  Both;Strengthening;1 set    Knee Flexion  Limitations  HS curl with heels on peanut p-ball       Knee/Hip Exercises: Sidelying   Clams  R clam shell x 10 reps - no resistance              PT Education - 04/15/18 1241    Education Details  HEP update     Person(s) Educated  Patient    Methods  Explanation;Demonstration;Verbal cues;Handout    Comprehension  Verbalized understanding;Returned demonstration;Verbal cues required;Need further instruction       PT Short Term Goals - 04/05/18 1111      PT SHORT TERM GOAL #1   Title  Patient to be independnent with initial HEP.    Time  4    Period  Weeks    Status  On-going        PT Long Term Goals - 04/05/18 1112      PT LONG TERM GOAL #1   Title  Patient to be independent with advanced HEP.    Time  8    Period  Weeks    Status  On-going      PT LONG TERM GOAL #2   Title  Patient to demonstrate >=4+/5 strength in R LE and >=3+/5 strength in L LE.     Time  8    Period  Weeks    Status  On-going      PT LONG TERM GOAL #3   Title  Patient to  report tolerance of 15 min of walking without pain limiting.    Time  8    Period  Weeks    Status  On-going      PT LONG TERM GOAL #4   Title  Patient to score >44/56 on Berg balance test with use of quad cane to demonstrate decreased fall risk.     Time  8    Period  Weeks    Status  On-going      PT LONG TERM GOAL #5   Title  Patient to demonstrate stair climbing up/down 13 steps with 1 handrail and quad cane with good stability and safe sequencing.     Time  8    Period  Weeks    Status  On-going            Plan - 04/15/18 1222    Clinical Impression Statement  Brandy Roberts reporting improved R knee comfort today however notes R knee hurting her at 10/10 pain levels at night over weekend.  Tolerated all LE strengthening and ROM activities well today in session without complaint.  Ended visit pain free.  HEP updated.      Clinical Impairments Affecting Rehab Potential  stroke, OA, migraines, HTN, HLD, depression, anxiety, anemia    PT Treatment/Interventions  ADLs/Self Care Home Management;Cryotherapy;Functional mobility training;Electrical Stimulation;Iontophoresis 4mg /ml Dexamethasone;Moist Heat;Ultrasound;DME Instruction;Gait training;Stair training;Therapeutic activities;Therapeutic exercise;Manual techniques;Orthotic Fit/Training;Patient/family education;Balance training;Neuromuscular re-education;Passive range of motion;Dry needling;Energy conservation;Splinting;Taping;Vasopneumatic Device    Consulted and Agree with Plan of Care  Patient       Patient will benefit from skilled therapeutic intervention in order to improve the following deficits and impairments:  Abnormal gait, Decreased endurance, Impaired tone, Decreased activity tolerance, Decreased strength, Pain, Difficulty walking, Decreased mobility, Decreased balance, Decreased range of motion, Postural dysfunction, Impaired flexibility, Decreased coordination, Decreased safety awareness  Visit Diagnosis: Chronic pain of  right knee  Difficulty in walking, not elsewhere classified  Other symptoms and signs involving the musculoskeletal system  Unsteadiness on feet  Paralytic gait  Problem List Patient Active Problem List   Diagnosis Date Noted  . CVA (cerebral vascular accident) ([place]) 10/25/2016  . Post-menopausal bleeding 09/24/2016  . OSA (obstructive sleep apnea) 09/03/2015  . Heart valve vegetation 09/03/2015  . Snoring 05/02/2015  . Essential hypertension 05/02/2015  . Tobacco use disorder 05/02/2015  . Headache 05/01/2015  . Dysphagia, post-stroke 02/20/2015  . Former smoker 01/24/2015  . Alteration of sensations, post-stroke 12/22/2014  . Cognitive deficit, post-stroke 11/23/2014  . Left hemiparesis ([place]) 10/23/2014  . Cardiomyopathy due to hypertension ([place]) 10/23/2014  . Chronic ischemic vertebrobasilar artery thalamic stroke   . Aortic valve vegetation   . Hypertensive heart disease   . Overweight (BMI 25.0-29.9)   . H/O noncompliance with medical treatment, presenting hazards to health   . Aortic regurgitation   . Cerebral infarction due to thrombosis of right middle cerebral artery ([place])   . Hyperlipidemia   . Embolic stroke involving right middle cerebral artery ([place]) 10/06/2014    Bess Harvest, PTA 04/15/18 12:41 PM  [facility] [address]  [place], [place], [address] Phone: [phone]   Fax:  [phone]  Name: Brandy Roberts MRN: [id number] Date of Birth: 06-23-1970

## 2018-04-19 ENCOUNTER — Ambulatory Visit: Payer: Medicare Other | Admitting: Physical Therapy

## 2018-04-21 ENCOUNTER — Encounter: Payer: Self-pay | Admitting: Physical Therapy

## 2018-04-21 ENCOUNTER — Ambulatory Visit: Payer: Medicare Other | Admitting: Physical Therapy

## 2018-04-21 VITALS — BP 164/82 | HR 57

## 2018-04-21 DIAGNOSIS — R262 Difficulty in walking, not elsewhere classified: Secondary | ICD-10-CM | POA: Diagnosis not present

## 2018-04-21 DIAGNOSIS — G8929 Other chronic pain: Secondary | ICD-10-CM | POA: Diagnosis not present

## 2018-04-21 DIAGNOSIS — R2681 Unsteadiness on feet: Secondary | ICD-10-CM | POA: Diagnosis not present

## 2018-04-21 DIAGNOSIS — R29898 Other symptoms and signs involving the musculoskeletal system: Secondary | ICD-10-CM | POA: Diagnosis not present

## 2018-04-21 DIAGNOSIS — M25561 Pain in right knee: Principal | ICD-10-CM

## 2018-04-21 DIAGNOSIS — R261 Paralytic gait: Secondary | ICD-10-CM | POA: Diagnosis not present

## 2018-04-21 NOTE — Progress Notes (Signed)
NEUROLOGY FOLLOW UP OFFICE NOTE  Brandy Roberts [id number]  HISTORY OF PRESENT ILLNESS: Brandy Roberts is a 47 year old right-handed female with hypertension, hyperlipidemia, and history of stroke who follows up for migraine.    UPDATE: Intensity:  severe Duration:  Few hours Frequency:  10 headache days over past 30 days Current NSAIDS: None Current analgesics: Fioricet (ineffective) Current triptans: None Current ergotamine: None Current anti-emetic: Zofran 4 mg, Reglan 10 mg Current muscle relaxants: None Current anti-anxiolytic: None Current sleep aide: trazodone Current Antihypertensive medications: Amlodipine 10 mg, hydralazine, Spironolactone, lisinopril, Lopressor Current Antidepressant medications: Venlafaxine XR 150 mg Current Anticonvulsant medications: Topiramate 50 mg at bedtime, gabapentin 300 mg twice daily Current anti-CGRP: None Current Vitamins/Herbal/Supplements: None Current Antihistamines/Decongestants: None Other therapy: None Hormone/birth control: None  Caffeine: Coffee, sweet tea, not daily Alcohol: No Smoker: Cigarettes Diet: Hydrates Exercise: No Depression: Yes; Anxiety: Yes Other pain: Chronic right knee pain Sleep hygiene: Sometimes tosses and turns  In May, she endorsed increased problems with balance and falls.  She stated that they were associated with headache and worsening of her left-sided weakness.  MRI of the brain without contrast from 11/13/2017 was personally reviewed and demonstrated no new intracranial abnormalities.  HISTORY:  Onset: Migraines since she was young, but worse since her stroke in May 2016. Location:  holocephalic Quality:  pounding Initial Intensity:  10/10 Aura:  no Prodrome:  no Postdrome:  no Associated symptoms: Photophobia, phonophobia.  Rarely nausea.  She has not had any new worse headache of her life, waking up from sleep Initial Duration:  2 days (but Fioricet and ibuprofen lowers intensity  down to 2-3/10) Initial Frequency:  Once or twice a month Frequency of abortive medication: only as needed Triggers: None Relieving factors:  Laying down.  Butalbital, ibuprofen.  Tramadol helped but she was told not to take it. Activity:  aggravates  Past NSAIDS:  Ibuprofen, naproxen Past analgesics:  Tylenol, Excedrin, Tramadol (effective but GI upset) Past abortive triptans:  no Past muscle relaxants:  no Past anti-emetic:  no Past antihypertensive medications:  no Past antidepressant medications:  no Past anticonvulsant medications:  no Past vitamins/Herbal/Supplements:  no Other past therapies:  no  Family history of headache:  Sister.  STROKE: She had a stroke in May 2016 with left sided weakness.  MRI of brain from 10/06/14 was personally reviewed and revealed small acute lacunar infarcts in the right thalamus and right corona radiata, as well as chronic lacunar infarcts in the left hemisphere.  CTA of head and neck revealed diffuse bilateral petrous and cavernous carotid stenosis.  Cardiac source of embolus was not discovered.    She was admitted to [facility] from 10/24/16 to 10/26/16 for stroke-like event.  She suddenly developed left sided weakness and slurred speech with associated headache.  Blood pressure in ED was 120/66.  CT of head was personally reviewed and negative for acute abnormality.  She had refused tPA.  MRI of brain was personally reviewed and revealed no acute stroke or bleed.  MRA of head revealed no significant intracranial stenosis or occlusion.  LDL was 84.  Hgb A1c was 5.7.  As per neurology evaluation, her deficits appeared to be non-organic, with unusual speech pattern and giveaway weakness.  Somatization was suspected.  2D echo from 11/06/16 demonstrated normal LV EF of 65-70% with no cardiac source of emboli.  Atorvastatin was increased from 10mg  to 80mg  daily.  Differential includes TIA vs hemiplegic migraine vs somatization.  PAST MEDICAL  HISTORY: Past Medical  History:  Diagnosis Date  . Anemia   . Anxiety   . Depression   . Hyperlipidemia   . Hypertension   . Migraines   . Osteoarthritis   . Sleep apnea   . Stroke [facility])     MEDICATIONS: Current Outpatient Medications on File Prior to Visit  Medication Sig Dispense Refill  . amLODipine (NORVASC) 10 MG tablet Take 1 tablet (10 mg total) by mouth daily. 90 tablet 1  . atorvastatin (LIPITOR) 80 MG tablet Take 1 tablet (80 mg total) by mouth daily at 6 PM. 90 tablet 3  . butalbital-acetaminophen-caffeine (FIORICET WITH CODEINE) 50-325-40-30 MG capsule Take 1 capsule by mouth every 6 (six) hours as needed.    . clopidogrel (PLAVIX) 75 MG tablet Take 1 tablet (75 mg total) by mouth daily. 90 tablet 3  . gabapentin (NEURONTIN) 300 MG capsule Take 1 capsule (300 mg total) by mouth 2 (two) times daily. 60 capsule 2  . hydrALAZINE (APRESOLINE) 50 MG tablet TAKE 1 TABLET BY MOUTH 3 TIMES DAILY WITH MEALS. 90 tablet 5  . lisinopril (PRINIVIL,ZESTRIL) 40 MG tablet Take 1 tablet (40 mg total) by mouth daily. 90 tablet 1  . metoCLOPramide (REGLAN) 10 MG tablet Take 1 tablet (10 mg total) by mouth every 6 (six) hours as needed for nausea (and headache). 30 tablet 2  . metoprolol tartrate (LOPRESSOR) 50 MG tablet Take 1 tablet (50 mg total) by mouth 2 (two) times daily. 60 tablet 5  . pantoprazole (PROTONIX) 20 MG tablet Take 1 tablet (20 mg total) by mouth daily. 90 tablet 1  . spironolactone (ALDACTONE) 25 MG tablet Take 1 tablet (25 mg total) by mouth daily. 90 tablet 1  . topiramate (TOPAMAX) 50 MG tablet Take 1 tablet (50 mg total) by mouth daily. 90 tablet 1  . traZODone (DESYREL) 100 MG tablet Take 1 tablet (100 mg total) by mouth at bedtime. 90 tablet 1  . venlafaxine XR (EFFEXOR-XR) 150 MG 24 hr capsule Take 1 capsule (150 mg total) daily with breakfast by mouth. 30 capsule 5  . venlafaxine XR (EFFEXOR-XR) 150 MG 24 hr capsule Take 1 capsule (150 mg total) by mouth daily with  breakfast. 30 capsule 3   No current facility-administered medications on file prior to visit.     ALLERGIES: Allergies  Allergen Reactions  . Tylenol [Acetaminophen] Other (See Comments)    Pt stated tylenol gives her extreme headache    FAMILY HISTORY: Family History  Problem Relation Age of Onset  . Cancer Mother        type unknown  . Hypertension Mother   . Hypertension Sister   . Diabetes Sister   . Cancer Maternal Aunt        4 aunts died of cancer types unknown    SOCIAL HISTORY: Social History   Socioeconomic History  . Marital status: Single    Spouse name: Not on file  . Number of children: 2  . Years of education: 70  . Highest education level: Not on file  Occupational History  . Occupation: N/A  Social Needs  . Financial resource strain: Not on file  . Food insecurity:    Worry: Not on file    Inability: Not on file  . Transportation needs:    Medical: Not on file    Non-medical: Not on file  Tobacco Use  . Smoking status: Former Smoker    Packs/day: 0.25    Types: Cigarettes    Last attempt to quit:  08/31/2017    Years since quitting: 0.6  . Smokeless tobacco: Never Used  . Tobacco comment: Pt. still smoke marijuana.   Substance and Sexual Activity  . Alcohol use: No    Alcohol/week: 0.0 standard drinks  . Drug use: Yes    Types: Marijuana  . Sexual activity: Yes    Birth control/protection: Post-menopausal  Lifestyle  . Physical activity:    Days per week: Not on file    Minutes per session: Not on file  . Stress: Not on file  Relationships  . Social connections:    Talks on phone: Not on file    Gets together: Not on file    Attends religious service: Not on file    Active member of club or organization: Not on file    Attends meetings of clubs or organizations: Not on file    Relationship status: Not on file  . Intimate partner violence:    Fear of current or ex partner: Not on file    Emotionally abused: Not on file     Physically abused: Not on file    Forced sexual activity: Not on file  Other Topics Concern  . Not on file  Social History Narrative   Patient lives with her uncle in a one story home.  Has 2 sons.  Currently on disability.  Education: high school.   Drinks 1-2 sodas a week     REVIEW OF SYSTEMS: Constitutional: No fevers, chills, or sweats, no generalized fatigue, change in appetite Eyes: No visual changes, double vision, eye pain Ear, nose and throat: No hearing loss, ear pain, nasal congestion, sore throat Cardiovascular: No chest pain, palpitations Respiratory:  No shortness of breath at rest or with exertion, wheezes GastrointestinaI: No nausea, vomiting, diarrhea, abdominal pain, fecal incontinence Genitourinary:  No dysuria, urinary retention or frequency Musculoskeletal:  No neck pain, back pain Integumentary: No rash, pruritus, skin lesions Neurological: as above Psychiatric: depression Endocrine: No palpitations, fatigue, diaphoresis, mood swings, change in appetite, change in weight, increased thirst Hematologic/Lymphatic:  No purpura, petechiae. Allergic/Immunologic: no itchy/runny eyes, nasal congestion, recent allergic reactions, rashes  PHYSICAL EXAM: Blood pressure (!) 150/68, pulse (!) 58, height 5\' 6"  (1.676 m), weight 191 lb (86.6 kg), SpO2 97 %. General: No acute distress.  Patient appears well-groomed.  Head:  Normocephalic/atraumatic Eyes:  Fundi examined but not visualized Neck: supple, no paraspinal tenderness, full range of motion Heart:  Regular rate and rhythm Lungs:  Clear to auscultation bilaterally Back: No paraspinal tenderness Neurological Exam: Alert and oriented to person, place, and time.  Attention span and concentration intact.  Recent and remote memory intact.  Fund of knowledge intact.  Speech fluent and not dysarthric, language intact.  Decreased left facial sensation.  Otherwise, CN II-XII intact.  Bulk and tone normal.  Muscle strength 5-/5  left deltoid and biceps, 4+/5 left triceps, handgrip and left lower extremity.  5/5 on right upper and lower extremities.  Reduced amplitude and speed of finger tapping on left, but arrhythmic.  Mildly reduced light touch sensation on the left, intact on the right.  Deep tendon reflexes 3+ throughout, slightly more brisk on the left.  Finger-to-nose testing with questionable dysmetria.  Left hemiparetic gait.  Romberg with sway.  IMPRESSION: 1.  Migraine without aura, without status migrainosus, not intractable 2.  Hemiplegia of left side as late effect of stroke 3.  HTN 4.  Tobacco use disorder  PLAN: 1.  We will start Emgality 2.  We will  increase venlafaxine XR to 225mg  daily to address depression 3.  Stop Fioricet.  She will try flurbiprofen 100mg  for abortive therapy.   4.  Limit use of pain relievers to no more than 2 days out of week to prevent risk of rebound or medication-overuse headache. 5.  Keep headache diary 6.  Smoking cessation 7.  Follow up in 3 to 4 months.  Metta Clines, DO  CC: [name], NP

## 2018-04-21 NOTE — Therapy (Addendum)
[facility] [address]  [place], [place], [address] Phone: [phone]   Fax:  [phone]  Physical Therapy Treatment  Patient Details  Name: Brandy Roberts MRN: [id number] Date of Birth: 11-Apr-1971 Referring Provider (PT): Rhina Brackett, MD   Encounter Date: 04/21/2018  PT End of Session - 04/21/18 1046    Visit Number  4    Number of Visits  17    Date for PT Re-Evaluation  05/20/18    Authorization Type  Medicare & Medicaid    PT Start Time  1011    PT Stop Time  1044    PT Time Calculation (min)  33 min    Activity Tolerance  Patient tolerated treatment well;Patient limited by lethargy    Behavior During Therapy  [facility] for tasks assessed/performed       Past Medical History:  Diagnosis Date  . Anemia   . Anxiety   . Depression   . Hyperlipidemia   . Hypertension   . Migraines   . Osteoarthritis   . Sleep apnea   . Stroke [facility])     Past Surgical History:  Procedure Laterality Date  . CESAREAN SECTION    . GANGLION CYST EXCISION    . TEE WITHOUT CARDIOVERSION N/A 10/09/2014   Procedure: TRANSESOPHAGEAL ECHOCARDIOGRAM (TEE);  Surgeon: Sanda Klein, MD;  Location: [facility] ENDOSCOPY;  Service: Cardiovascular;  Laterality: N/A;  . TONSILLECTOMY      Vitals:   04/21/18 1020 04/21/18 1028  BP: (!) 162/86 (!) 164/82  Pulse: 69 (!) 57  SpO2: 98% 95%    Subjective Assessment - 04/21/18 1009    Subjective  Reports that she has been doing alright and knee "has been doing pretty good." Had a question about one of the exercises since she was in a lot of pain. Believes she has seen a change in pain levels since starting PT. Reports she has not taken her meds this AM because her ride came to pick her up early. Reports she has a headache today.    Pertinent History  stroke, OA, migraines, HTN, HLD, depression, anxiety, anemia    Diagnostic tests  02/24/18 R knee MRI: degenerative tearing og the anterior  horn of lateral meniscus, moderate OA, questionable 22mm free chondral fragment, small baker's cyst    Patient Stated Goals  "i want to get this pain right in my knee"    Currently in Pain?  No/denies                       [facility] Adult PT Treatment/Exercise - 04/21/18 0001      Exercises   Exercises  Knee/Hip      Knee/Hip Exercises: Aerobic   Nustep  Lvl 1, 5 min UE/LEs   c/o mild fatigue but able to continue     Knee/Hip Exercises: Seated   Other Seated Knee/Hip Exercises  B hip adduction isometrics with ball 10x10"   cues to avoid valsalva to avoid BP spike            PT Education - 04/21/18 1045    Education Details  edu on proper hydration and importance of taking meds as directed by MD; advised to get in touch with MD if symptoms worsen after taking meds    Person(s) Educated  Patient    Methods  Explanation    Comprehension  Verbalized understanding       PT Short Term Goals - 04/05/18  [place] #1   Title  Patient to be independnent with initial HEP.    Time  4    Period  Weeks    Status  On-going        PT Long Term Goals - 04/05/18 1112      PT LONG TERM GOAL #1   Title  Patient to be independent with advanced HEP.    Time  8    Period  Weeks    Status  On-going      PT LONG TERM GOAL #2   Title  Patient to demonstrate >=4+/5 strength in R LE and >=3+/5 strength in L LE.     Time  8    Period  Weeks    Status  On-going      PT LONG TERM GOAL #3   Title  Patient to report tolerance of 15 min of walking without pain limiting.    Time  8    Period  Weeks    Status  On-going      PT LONG TERM GOAL #4   Title  Patient to score >44/56 on Berg balance test with use of quad cane to demonstrate decreased fall risk.     Time  8    Period  Weeks    Status  On-going      PT LONG TERM GOAL #5   Title  Patient to demonstrate stair climbing up/down 13 steps with 1 handrail and quad cane with good stability and safe  sequencing.     Time  8    Period  Weeks    Status  On-going            Plan - 04/21/18 1047    Clinical Impression Statement  Patient arrived to session with no new complaints. Reports she has seen improvement in pain levels since starting PT. Reported fatigue and SOB after completing cardiovascular warm up. Admits to not taking BP meds this AM and having slight HA. BP taken after warmup and was abnormally high. Advised patient to hold off on drinking her coffee and allowed for a sitting rest break. Vitals taken again and were relatively unchanged. Advised patient to take meds as directed by MD once she gets home and monitor for worsening symptoms. Attempted to perform light LE ther-ex in sitting position to avoid BP changes- patient reported dizziness and exercises were discontinued. Denied chest pain, numbness/tingling, weakness. Patient admitted to not drinking any water this AM. Provided patient with water and another sitting rest break. Reports she has appointment to see her MD tomorrow AM, as she has a hx of HAs. Advised patient to get in touch with her MD if symptoms worsen after taking her meds. Patient reported understanding. Monitored patient for symptoms until she felt well enough to ambulate out of clinic without issues.     Clinical Impairments Affecting Rehab Potential  stroke, OA, migraines, HTN, HLD, depression, anxiety, anemia    PT Treatment/Interventions  ADLs/Self Care Home Management;Cryotherapy;Functional mobility training;Electrical Stimulation;Iontophoresis 4mg /ml Dexamethasone;Moist Heat;Ultrasound;DME Instruction;Gait training;Stair training;Therapeutic activities;Therapeutic exercise;Manual techniques;Orthotic Fit/Training;Patient/family education;Balance training;Neuromuscular re-education;Passive range of motion;Dry needling;Energy conservation;Splinting;Taping;Vasopneumatic Device    PT Next Visit Plan  reassess vitals; review bridge with ball squeeze    Consulted and  Agree with Plan of Care  Patient       Patient will benefit from skilled therapeutic intervention in order to improve the following deficits and impairments:  Abnormal gait, Decreased endurance,  Impaired tone, Decreased activity tolerance, Decreased strength, Pain, Difficulty walking, Decreased mobility, Decreased balance, Decreased range of motion, Postural dysfunction, Impaired flexibility, Decreased coordination, Decreased safety awareness  Visit Diagnosis: Chronic pain of right knee  Difficulty in walking, not elsewhere classified  Other symptoms and signs involving the musculoskeletal system  Unsteadiness on feet  Paralytic gait     Problem List Patient Active Problem List   Diagnosis Date Noted  . CVA (cerebral vascular accident) ([place]) 10/25/2016  . Post-menopausal bleeding 09/24/2016  . OSA (obstructive sleep apnea) 09/03/2015  . Heart valve vegetation 09/03/2015  . Snoring 05/02/2015  . Essential hypertension 05/02/2015  . Tobacco use disorder 05/02/2015  . Headache 05/01/2015  . Dysphagia, post-stroke 02/20/2015  . Former smoker 01/24/2015  . Alteration of sensations, post-stroke 12/22/2014  . Cognitive deficit, post-stroke 11/23/2014  . Left hemiparesis ([place]) 10/23/2014  . Cardiomyopathy due to hypertension ([place]) 10/23/2014  . Chronic ischemic vertebrobasilar artery thalamic stroke   . Aortic valve vegetation   . Hypertensive heart disease   . Overweight (BMI 25.0-29.9)   . H/O noncompliance with medical treatment, presenting hazards to health   . Aortic regurgitation   . Cerebral infarction due to thrombosis of right middle cerebral artery ([place])   . Hyperlipidemia   . Embolic stroke involving right middle cerebral artery ([place]) 10/06/2014     Janene Harvey, PT, DPT 04/21/18 11:03 AM   [facility] [address]  [place], [place], [address] Phone: [phone]   Fax:   [phone]  Name: Brandy Roberts MRN: [id number] Date of Birth: [birth year]

## 2018-04-22 ENCOUNTER — Telehealth: Payer: Self-pay

## 2018-04-22 ENCOUNTER — Encounter: Payer: Self-pay | Admitting: Neurology

## 2018-04-22 ENCOUNTER — Ambulatory Visit (INDEPENDENT_AMBULATORY_CARE_PROVIDER_SITE_OTHER): Payer: Medicare Other | Admitting: Neurology

## 2018-04-22 VITALS — BP 150/68 | HR 58 | Ht 66.0 in | Wt 191.0 lb

## 2018-04-22 DIAGNOSIS — F329 Major depressive disorder, single episode, unspecified: Secondary | ICD-10-CM

## 2018-04-22 DIAGNOSIS — F17210 Nicotine dependence, cigarettes, uncomplicated: Secondary | ICD-10-CM | POA: Diagnosis not present

## 2018-04-22 DIAGNOSIS — I69959 Hemiplegia and hemiparesis following unspecified cerebrovascular disease affecting unspecified side: Secondary | ICD-10-CM

## 2018-04-22 DIAGNOSIS — G43009 Migraine without aura, not intractable, without status migrainosus: Secondary | ICD-10-CM | POA: Diagnosis not present

## 2018-04-22 DIAGNOSIS — I10 Essential (primary) hypertension: Secondary | ICD-10-CM

## 2018-04-22 DIAGNOSIS — F32A Depression, unspecified: Secondary | ICD-10-CM

## 2018-04-22 MED ORDER — GALCANEZUMAB-GNLM 120 MG/ML ~~LOC~~ SOSY: 120.0000 mg | PREFILLED_SYRINGE | SUBCUTANEOUS | 11 refills | Status: DC

## 2018-04-22 MED ORDER — VENLAFAXINE HCL ER 75 MG PO CP24: 225.0000 mg | ORAL_CAPSULE | Freq: Every day | ORAL | 3 refills | Status: DC

## 2018-04-22 MED ORDER — FLURBIPROFEN 100 MG PO TABS: ORAL_TABLET | ORAL | 3 refills | Status: DC

## 2018-04-22 NOTE — Telephone Encounter (Signed)
[place], spoke with [place]. I wanted to make sure the Pt would receive 2 pens of Emgality for the loading dose.

## 2018-04-22 NOTE — Progress Notes (Signed)
PA initiated via [email or web address] for pt's  Venlafaxine HCl ER 75MG  er capsules  Key: [id number]

## 2018-04-22 NOTE — Patient Instructions (Addendum)
1.  We will start one of the new migraine injections (once monthly), Emgality 2.  We will increase venlafaxine XR to 225mg  daily (to help with depression) 3.  Stop butalbital-caffeine-acetaminophen pill.  Instead, take flurbiprofen 100mg  when you get a migraine.  Take 1 tablet every 8 hours as needed, maximum 3 tablets in 24 hours. 4.  Limit use of pain relievers to no more than 2 days out of week to prevent risk of rebound or medication-overuse headache. 5.  Follow up with Dr. Raul Del regarding blood pressure. 6.  Follow up in 3 to 4 months.

## 2018-04-23 NOTE — Progress Notes (Signed)
Received notice via [email or web address] that pt's Venlafaxine has been approved  thru 06/01/18

## 2018-04-26 ENCOUNTER — Ambulatory Visit: Payer: Medicare Other | Admitting: Physical Therapy

## 2018-04-26 ENCOUNTER — Encounter: Payer: Self-pay | Admitting: Physical Therapy

## 2018-04-26 VITALS — BP 116/68 | HR 55

## 2018-04-26 DIAGNOSIS — R29898 Other symptoms and signs involving the musculoskeletal system: Secondary | ICD-10-CM

## 2018-04-26 DIAGNOSIS — M25561 Pain in right knee: Principal | ICD-10-CM

## 2018-04-26 DIAGNOSIS — R262 Difficulty in walking, not elsewhere classified: Secondary | ICD-10-CM

## 2018-04-26 DIAGNOSIS — G8929 Other chronic pain: Secondary | ICD-10-CM

## 2018-04-26 DIAGNOSIS — R2681 Unsteadiness on feet: Secondary | ICD-10-CM | POA: Diagnosis not present

## 2018-04-26 DIAGNOSIS — R261 Paralytic gait: Secondary | ICD-10-CM | POA: Diagnosis not present

## 2018-04-26 NOTE — Therapy (Signed)
[facility] [address]  [place], [place], [address] Phone: [phone]   Fax:  [phone]  Physical Therapy Evaluation  Patient Details  Name: Brandy Roberts MRN: [id number] Date of Birth: 1971/05/13 Referring Provider (PT): Rhina Brackett, MD   Encounter Date: 04/26/2018  PT End of Session - 04/26/18 1204    Visit Number  5    Number of Visits  17    Date for PT Re-Evaluation  05/20/18    Authorization Type  Medicare & Medicaid    PT Start Time  1008    PT Stop Time  1059    PT Time Calculation (min)  51 min    Activity Tolerance  Patient tolerated treatment well    Behavior During Therapy  [facility] for tasks assessed/performed       Past Medical History:  Diagnosis Date  . Anemia   . Anxiety   . Depression   . Hyperlipidemia   . Hypertension   . Migraines   . Osteoarthritis   . Sleep apnea   . Stroke [facility])     Past Surgical History:  Procedure Laterality Date  . CESAREAN SECTION    . GANGLION CYST EXCISION    . TEE WITHOUT CARDIOVERSION N/A 10/09/2014   Procedure: TRANSESOPHAGEAL ECHOCARDIOGRAM (TEE);  Surgeon: Sanda Klein, MD;  Location: [facility] ENDOSCOPY;  Service: Cardiovascular;  Laterality: N/A;  . TONSILLECTOMY      Vitals:   04/26/18 1014  BP: 116/68  Pulse: (!) 55  SpO2: 95%     Subjective Assessment - 04/26/18 1009    Subjective  Reports she went to neurologist the other day and has had a lot of medication changes. Stopped her HA medicine and has to take a shot; also upped her depression medicine. Reports she has not taken her meds this AM.     Pertinent History  stroke, OA, migraines, HTN, HLD, depression, anxiety, anemia    Diagnostic tests  02/24/18 R knee MRI: degenerative tearing og the anterior horn of lateral meniscus, moderate OA, questionable 33mm free chondral fragment, small baker's cyst    Patient Stated Goals  "i want to get this pain right in my knee"    Currently in  Pain?  No/denies                    Objective measurements completed on examination: See above findings.      [facility] Adult PT Treatment/Exercise - 04/26/18 0001      Exercises   Exercises  Knee/Hip      Knee/Hip Exercises: Stretches   Passive Hamstring Stretch  Right;30 seconds;2 reps    Passive Hamstring Stretch Limitations  strap       Knee/Hip Exercises: Aerobic   Nustep  L2 x 6 min LEs only      Knee/Hip Exercises: Standing   Terminal Knee Extension  Strengthening;Right;1 set;10 reps;Limitations    Terminal Knee Extension Limitations  standing TKE w/ chair UE support and ball 10x5"      Knee/Hip Exercises: Seated   Long Arc Quad  Strengthening;Right;1 set;10 reps;Limitations    Long Arc Quad Weight  2 lbs.    Long [employer] Limitations  cues for slow eccentric lowe    Other Seated Knee/Hip Exercises  R LE fitter knee extension 1blue/1black x10    Other Seated Knee/Hip Exercises  R LE fitter knee flexion 2 blue x10    Sit to Sand  1 set;10 reps;without UE  support   cues for full upright posture     Knee/Hip Exercises: Supine   Quad Sets  Strengthening;Right;1 set;5 reps    Quad Sets Limitations  5x10"    Bridges with [employer]  Both;Strengthening;1 set;10 reps;Limitations   cues for UE and LE positioning   Straight Leg Raises  Right;10 reps    Straight Leg Raises Limitations  cues for controlled lower    Other Supine Knee/Hip Exercises  supine adduction ball squeeze 10x10"             PT Education - 04/26/18 1202    Education Details  administered handout and educated patient on [employer] orthotic services and use of AFO to correct foot drop; advised patient to take her meds as directed by MD    Person(s) Educated  Patient    Methods  Explanation;Handout    Comprehension  Verbalized understanding       PT Short Term Goals - 04/05/18 1111      PT SHORT TERM GOAL #1   Title  Patient to be independnent with initial HEP.    Time  4    Period   Weeks    Status  On-going        PT Long Term Goals - 04/05/18 1112      PT LONG TERM GOAL #1   Title  Patient to be independent with advanced HEP.    Time  8    Period  Weeks    Status  On-going      PT LONG TERM GOAL #2   Title  Patient to demonstrate >=4+/5 strength in R LE and >=3+/5 strength in L LE.     Time  8    Period  Weeks    Status  On-going      PT LONG TERM GOAL #3   Title  Patient to report tolerance of 15 min of walking without pain limiting.    Time  8    Period  Weeks    Status  On-going      PT LONG TERM GOAL #4   Title  Patient to score >44/56 on Berg balance test with use of quad cane to demonstrate decreased fall risk.     Time  8    Period  Weeks    Status  On-going      PT LONG TERM GOAL #5   Title  Patient to demonstrate stair climbing up/down 13 steps with 1 handrail and quad cane with good stability and safe sequencing.     Time  8    Period  Weeks    Status  On-going             Plan - 04/26/18 1204    Clinical Impression Statement  Patient arrived to session reporting that she has not taken her meds today. Saw her neurologist last week who changed HA and depression medications. Took vitals which were WFL- patient's HR low at baseline. Advised patient to take her meds as directed by MD when she gets home. Patient reporting improvement in R knee pain since starting PT, with most pain being at night now. Worked on progressive quad strengthening today with cues required to correct form intermittently. Patient reporting muscle burn during ther-ex but tolerable and able to continue. Reviewed supine bridge with ball squeeze exercise as patient reporting she had questions about it provided cues for form. Administered handout and educated patient on [employer] orthotic services and use of AFO to  correct foot drop. Patient reported understanding. Patient without complaints at end of session.    Clinical Impairments Affecting Rehab Potential  stroke, OA,  migraines, HTN, HLD, depression, anxiety, anemia    PT Treatment/Interventions  ADLs/Self Care Home Management;Cryotherapy;Functional mobility training;Electrical Stimulation;Iontophoresis 4mg /ml Dexamethasone;Moist Heat;Ultrasound;DME Instruction;Gait training;Stair training;Therapeutic activities;Therapeutic exercise;Manual techniques;Orthotic Fit/Training;Patient/family education;Balance training;Neuromuscular re-education;Passive range of motion;Dry needling;Energy conservation;Splinting;Taping;Vasopneumatic Device    PT Next Visit Plan  progress LE strengthening as tolerated; follow up about AFO    Consulted and Agree with Plan of Care  Patient       Patient will benefit from skilled therapeutic intervention in order to improve the following deficits and impairments:  Abnormal gait, Decreased endurance, Impaired tone, Decreased activity tolerance, Decreased strength, Pain, Difficulty walking, Decreased mobility, Decreased balance, Decreased range of motion, Postural dysfunction, Impaired flexibility, Decreased coordination, Decreased safety awareness  Visit Diagnosis: Chronic pain of right knee  Difficulty in walking, not elsewhere classified  Other symptoms and signs involving the musculoskeletal system  Unsteadiness on feet  Paralytic gait     Problem List Patient Active Problem List   Diagnosis Date Noted  . CVA (cerebral vascular accident) ([place]) 10/25/2016  . Post-menopausal bleeding 09/24/2016  . OSA (obstructive sleep apnea) 09/03/2015  . Heart valve vegetation 09/03/2015  . Snoring 05/02/2015  . Essential hypertension 05/02/2015  . Tobacco use disorder 05/02/2015  . Headache 05/01/2015  . Dysphagia, post-stroke 02/20/2015  . Former smoker 01/24/2015  . Alteration of sensations, post-stroke 12/22/2014  . Cognitive deficit, post-stroke 11/23/2014  . Left hemiparesis ([place]) 10/23/2014  . Cardiomyopathy due to hypertension ([place]) 10/23/2014  . Chronic ischemic  vertebrobasilar artery thalamic stroke   . Aortic valve vegetation   . Hypertensive heart disease   . Overweight (BMI 25.0-29.9)   . H/O noncompliance with medical treatment, presenting hazards to health   . Aortic regurgitation   . Cerebral infarction due to thrombosis of right middle cerebral artery ([place])   . Hyperlipidemia   . Embolic stroke involving right middle cerebral artery ([place]) 10/06/2014    Janene Harvey, PT, DPT 04/26/18 12:11 PM    [facility] [address]  [place], [place], [address] Phone: [phone]   Fax:  [phone]  Name: Rayah Fines MRN: [id number] Date of Birth: 01-Oct-1970

## 2018-04-28 ENCOUNTER — Telehealth: Payer: Self-pay

## 2018-04-28 ENCOUNTER — Ambulatory Visit: Payer: Medicare Other

## 2018-04-28 DIAGNOSIS — M25561 Pain in right knee: Principal | ICD-10-CM

## 2018-04-28 DIAGNOSIS — G8929 Other chronic pain: Secondary | ICD-10-CM | POA: Diagnosis not present

## 2018-04-28 DIAGNOSIS — R261 Paralytic gait: Secondary | ICD-10-CM

## 2018-04-28 DIAGNOSIS — R29898 Other symptoms and signs involving the musculoskeletal system: Secondary | ICD-10-CM

## 2018-04-28 DIAGNOSIS — R262 Difficulty in walking, not elsewhere classified: Secondary | ICD-10-CM | POA: Diagnosis not present

## 2018-04-28 DIAGNOSIS — R2681 Unsteadiness on feet: Secondary | ICD-10-CM | POA: Diagnosis not present

## 2018-04-28 DIAGNOSIS — G43009 Migraine without aura, not intractable, without status migrainosus: Secondary | ICD-10-CM

## 2018-04-28 MED ORDER — GALCANEZUMAB-GNLM 120 MG/ML ~~LOC~~ SOSY
120.0000 mg | PREFILLED_SYRINGE | SUBCUTANEOUS | 11 refills | Status: DC
Start: 2018-04-28 — End: 2018-06-29

## 2018-04-28 NOTE — Therapy (Addendum)
[facility] [address]  [place], [place], [address] Phone: [phone]   Fax:  [phone]  Physical Therapy Treatment  Patient Details  Name: Brandy Roberts MRN: [id number] Date of Birth: 05-Feb-1971 Referring Provider (PT): Rhina Brackett, MD   Progress Note Reporting Period 03/25/18 to 04/28/18  See note below for Objective Data and Assessment of Progress/Goals.    Encounter Date: 04/28/2018  PT End of Session - 04/28/18 1027    Visit Number  6    Number of Visits  17    Date for PT Re-Evaluation  05/20/18    Authorization Type  Medicare & Medicaid    PT Start Time  2025    PT Stop Time  1058    PT Time Calculation (min)  43 min    Activity Tolerance  Patient tolerated treatment well    Behavior During Therapy  WFL for tasks assessed/performed       Past Medical History:  Diagnosis Date  . Anemia   . Anxiety   . Depression   . Hyperlipidemia   . Hypertension   . Migraines   . Osteoarthritis   . Sleep apnea   . Stroke [facility])     Past Surgical History:  Procedure Laterality Date  . CESAREAN SECTION    . GANGLION CYST EXCISION    . TEE WITHOUT CARDIOVERSION N/A 10/09/2014   Procedure: TRANSESOPHAGEAL ECHOCARDIOGRAM (TEE);  Surgeon: Sanda Klein, MD;  Location: [facility] ENDOSCOPY;  Service: Cardiovascular;  Laterality: N/A;  . TONSILLECTOMY      There were no vitals filed for this visit.  Subjective Assessment - 04/28/18 1025    Subjective  Pt. reporting R knee still hurts mostly at night waking her multiple times/night.      Pertinent History  stroke, OA, migraines, HTN, HLD, depression, anxiety, anemia    Diagnostic tests  02/24/18 R knee MRI: degenerative tearing og the anterior horn of lateral meniscus, moderate OA, questionable 57m free chondral fragment, small baker's cyst    Patient Stated Goals  "i want to get this pain right in my knee"    Currently in Pain?  No/denies    Pain Score   0-No pain   Pain rises to 10/10 at worst    Multiple Pain Sites  No                       [facility] Adult PT Treatment/Exercise - 04/28/18 1030      Knee/Hip Exercises: Aerobic   Recumbent Bike  lvl 1, 7 min       Knee/Hip Exercises: Machines for Strengthening   Cybex Knee Flexion  15# x 15 reps ; B LE      Knee/Hip Exercises: Standing   Forward Step Up  Right;Left;10 reps;Step Height: 6";Hand Hold: 1    Forward Step Up Limitations  1 ski poles       Knee/Hip Exercises: Seated   Long Arc Quad  Right;15 reps;Strengthening    Long Arc Quad Weight  2 lbs.    Long [employer] CorporationLimitations  cues for slow eccentric lowe    Hamstring Curl  Right;15 reps    Sit to [employer] 1 set;without UE support   x 12 reps     Knee/Hip Exercises: Supine   Straight Leg Raises  Right;15 reps    Straight Leg Raises Limitations  cues for controlled lower  PT Short Term Goals - 04/28/18 1113      PT SHORT TERM GOAL #1   Title  Patient to be independnent with initial HEP.    Time  4    Period  Weeks    Status  Achieved        PT Long Term Goals - 04/05/18 1112      PT LONG TERM GOAL #1   Title  Patient to be independent with advanced HEP.    Time  8    Period  Weeks    Status  On-going      PT LONG TERM GOAL #2   Title  Patient to demonstrate >=4+/5 strength in R LE and >=3+/5 strength in L LE.     Time  8    Period  Weeks    Status  On-going      PT LONG TERM GOAL #3   Title  Patient to report tolerance of 15 min of walking without pain limiting.    Time  8    Period  Weeks    Status  On-going      PT LONG TERM GOAL #4   Title  Patient to score >44/56 on Berg balance test with use of quad cane to demonstrate decreased fall risk.     Time  8    Period  Weeks    Status  On-going      PT LONG TERM GOAL #5   Title  Patient to demonstrate stair climbing up/down 13 steps with 1 handrail and quad cane with good stability and safe sequencing.     Time  8     Period  Weeks    Status  On-going            Plan - 04/28/18 1050    Clinical Impression Statement  Pt. reporting knee pain improving however bothers pt. most at night.  Pt. demonstrating verbally abusive language toward her "grandson" (~ age 39yr) x 2 during session today which was disruptive to session.  Tolerated addition of HS curl machine, forward 6" step-up well today without pain.  Continues to exhibit poor L LE clearance and f/u with DLangley Gausstoday regarding AFO with pt. reporting she still has not contacted [employer] regarding this.  Will plan to f/u with pt. regarding AFO in future visits.      Clinical Impairments Affecting Rehab Potential  stroke, OA, migraines, HTN, HLD, depression, anxiety, anemia    PT Treatment/Interventions  ADLs/Self Care Home Management;Cryotherapy;Functional mobility training;Electrical Stimulation;Iontophoresis 42mml Dexamethasone;Moist Heat;Ultrasound;DME Instruction;Gait training;Stair training;Therapeutic activities;Therapeutic exercise;Manual techniques;Orthotic Fit/Training;Patient/family education;Balance training;Neuromuscular re-education;Passive range of motion;Dry needling;Energy conservation;Splinting;Taping;Vasopneumatic Device    PT Next Visit Plan  Monitor repsonse to last sessions therex progression; progress LE strengthening as tolerated; follow up about AFO    Consulted and Agree with Plan of Care  Patient       Patient will benefit from skilled therapeutic intervention in order to improve the following deficits and impairments:  Abnormal gait, Decreased endurance, Impaired tone, Decreased activity tolerance, Decreased strength, Pain, Difficulty walking, Decreased mobility, Decreased balance, Decreased range of motion, Postural dysfunction, Impaired flexibility, Decreased coordination, Decreased safety awareness  Visit Diagnosis: Chronic pain of right knee  Difficulty in walking, not elsewhere classified  Other symptoms and signs  involving the musculoskeletal system  Unsteadiness on feet  Paralytic gait     Problem List Patient Active Problem List   Diagnosis Date Noted  . CVA (cerebral vascular accident) ([place]  .  Post-menopausal bleeding 09/24/2016  . OSA (obstructive sleep apnea) 09/03/2015  . Heart valve vegetation 09/03/2015  . Snoring 05/02/2015  . Essential hypertension 05/02/2015  . Tobacco use disorder 05/02/2015  . Headache 05/01/2015  . Dysphagia, post-stroke 02/20/2015  . Former smoker 01/24/2015  . Alteration of sensations, post-stroke 12/22/2014  . Cognitive deficit, post-stroke 11/23/2014  . Left hemiparesis ([place]) 10/23/2014  . Cardiomyopathy due to hypertension ([place]) 10/23/2014  . Chronic ischemic vertebrobasilar artery thalamic stroke   . Aortic valve vegetation   . Hypertensive heart disease   . Overweight (BMI 25.0-29.9)   . H/O noncompliance with medical treatment, presenting hazards to health   . Aortic regurgitation   . Cerebral infarction due to thrombosis of right middle cerebral artery ([place])   . Hyperlipidemia   . Embolic stroke involving right middle cerebral artery ([place]) 10/06/2014    Bess Harvest, PTA 04/28/18 1:05 PM  [facility] [address]  [place], [place], [address] Phone: [phone]   Fax:  [phone]  Name: Brandy Roberts MRN: [id number] Date of Birth: 1970/07/17  PHYSICAL THERAPY DISCHARGE SUMMARY  Visits from Start of Care: 6  Current functional level related to goals / functional outcomes: Unable to assess; patient d/c'd per no show policy   Remaining deficits: Unable to assess   Education / Equipment: HEP  Plan: Patient agrees to discharge.  Patient goals were not met. Patient is being discharged due to not returning since the last visit.  ?????     Janene Harvey, PT, DPT 06/14/18 9:55 AM

## 2018-04-28 NOTE — Telephone Encounter (Signed)
Called Pt and advised she can come by to pick up sample of Emgality. She will come by on Monday 05/03/18. I rcvd a call from [place] at [facility] and [employer], they do not carry Emgality, I will need to send to [facility]. O/P pharmacy.

## 2018-05-03 ENCOUNTER — Ambulatory Visit: Payer: Medicare Other | Attending: Family Medicine

## 2018-05-05 ENCOUNTER — Encounter: Payer: Medicare Other | Admitting: Physical Therapy

## 2018-05-09 ENCOUNTER — Emergency Department (HOSPITAL_BASED_OUTPATIENT_CLINIC_OR_DEPARTMENT_OTHER)
Admission: EM | Admit: 2018-05-09 | Discharge: 2018-05-09 | Disposition: A | Discharge status: Home / Self Care | Payer: Medicare Other | Attending: Emergency Medicine | Admitting: Emergency Medicine

## 2018-05-09 ENCOUNTER — Encounter (HOSPITAL_BASED_OUTPATIENT_CLINIC_OR_DEPARTMENT_OTHER): Payer: Self-pay | Admitting: Emergency Medicine

## 2018-05-09 ENCOUNTER — Other Ambulatory Visit: Payer: Self-pay

## 2018-05-09 DIAGNOSIS — G43909 Migraine, unspecified, not intractable, without status migrainosus: Secondary | ICD-10-CM | POA: Diagnosis not present

## 2018-05-09 DIAGNOSIS — Z7902 Long term (current) use of antithrombotics/antiplatelets: Secondary | ICD-10-CM | POA: Insufficient documentation

## 2018-05-09 DIAGNOSIS — Z87891 Personal history of nicotine dependence: Secondary | ICD-10-CM | POA: Diagnosis not present

## 2018-05-09 DIAGNOSIS — Z79899 Other long term (current) drug therapy: Secondary | ICD-10-CM | POA: Diagnosis not present

## 2018-05-09 DIAGNOSIS — Z8673 Personal history of transient ischemic attack (TIA), and cerebral infarction without residual deficits: Secondary | ICD-10-CM | POA: Insufficient documentation

## 2018-05-09 DIAGNOSIS — F329 Major depressive disorder, single episode, unspecified: Secondary | ICD-10-CM | POA: Diagnosis not present

## 2018-05-09 DIAGNOSIS — G4489 Other headache syndrome: Secondary | ICD-10-CM | POA: Diagnosis not present

## 2018-05-09 DIAGNOSIS — F419 Anxiety disorder, unspecified: Secondary | ICD-10-CM | POA: Diagnosis not present

## 2018-05-09 DIAGNOSIS — I10 Essential (primary) hypertension: Secondary | ICD-10-CM | POA: Insufficient documentation

## 2018-05-09 DIAGNOSIS — R51 Headache: Secondary | ICD-10-CM | POA: Diagnosis not present

## 2018-05-09 DIAGNOSIS — R52 Pain, unspecified: Secondary | ICD-10-CM | POA: Diagnosis not present

## 2018-05-09 DIAGNOSIS — R519 Headache, unspecified: Secondary | ICD-10-CM

## 2018-05-09 MED ORDER — SODIUM CHLORIDE 0.9 % IV BOLUS
500.0000 mL | Freq: Once | INTRAVENOUS | Status: AC
  Administered 2018-05-09: 500 mL via INTRAVENOUS

## 2018-05-09 MED ORDER — DIPHENHYDRAMINE HCL 50 MG/ML IJ SOLN
25.0000 mg | Freq: Once | INTRAMUSCULAR | Status: AC
  Administered 2018-05-09: 25 mg via INTRAVENOUS
  Filled 2018-05-09: qty 1

## 2018-05-09 MED ORDER — PROCHLORPERAZINE EDISYLATE 10 MG/2ML IJ SOLN
10.0000 mg | Freq: Once | INTRAMUSCULAR | Status: AC
  Administered 2018-05-09: 10 mg via INTRAVENOUS
  Filled 2018-05-09: qty 2

## 2018-05-09 NOTE — Discharge Instructions (Addendum)
Please follow up with primary care within 3 days. Refill your medicines and take them as prescribed. Return to the ER for new or worsening symptoms or any other concerns.

## 2018-05-09 NOTE — ED Triage Notes (Signed)
MIgraine since Thursday. Pt has history of HTN. Pt states she has been out of her medication for approx. 1 week

## 2018-05-09 NOTE — ED Notes (Signed)
Pt states she has history of hypertension and states that she ran out of her medication. She took the last dose of "ALL of her medications". She states she has been having headache all week but could not take it any longer so she came to be seen.

## 2018-05-09 NOTE — ED Provider Notes (Signed)
[facility] EMERGENCY DEPARTMENT Provider Note   CSN: [id number] Arrival date & time: 05/09/18  1733     History   Chief Complaint Headache  HPI Brandy Roberts is a 47 y.o. female with a hx of HTN, hyperlipidemia, prior stroke (w/ residual L hemiparesis), migraines, and OSA who presents to the ED via EMS with complaints of headache which started 5 days prior. Patient reports gradual onset of pain w/ steady progression. States pain is diffuse to the entire head. Currently a 10/10 in severity. She states she has not tried medicines at home. She has hx of similar migraines, seen in the ED previously for same, she states this does not feel different at all. Denies change in vision, new numbness/weakness, nausea, vomiting, chest pain, dyspnea, dizziness, or syncope.   States she ran out of all of her prescription medications today, she has refills available, will call tomorrow to get these filled.   HPI  Past Medical History:  Diagnosis Date  . Anemia   . Anxiety   . Depression   . Hyperlipidemia   . Hypertension   . Migraines   . Osteoarthritis   . Sleep apnea   . Stroke [facility])     Patient Active Problem List   Diagnosis Date Noted  . CVA (cerebral vascular accident) ([place]) 10/25/2016  . Post-menopausal bleeding 09/24/2016  . OSA (obstructive sleep apnea) 09/03/2015  . Heart valve vegetation 09/03/2015  . Snoring 05/02/2015  . Essential hypertension 05/02/2015  . Tobacco use disorder 05/02/2015  . Headache 05/01/2015  . Dysphagia, post-stroke 02/20/2015  . Former smoker 01/24/2015  . Alteration of sensations, post-stroke 12/22/2014  . Cognitive deficit, post-stroke 11/23/2014  . Left hemiparesis ([place]) 10/23/2014  . Cardiomyopathy due to hypertension ([place]) 10/23/2014  . Chronic ischemic vertebrobasilar artery thalamic stroke   . Aortic valve vegetation   . Hypertensive heart disease   . Overweight (BMI 25.0-29.9)   . H/O noncompliance with medical treatment,  presenting hazards to health   . Aortic regurgitation   . Cerebral infarction due to thrombosis of right middle cerebral artery ([place])   . Hyperlipidemia   . Embolic stroke involving right middle cerebral artery ([place]) 10/06/2014    Past Surgical History:  Procedure Laterality Date  . CESAREAN SECTION    . GANGLION CYST EXCISION    . TEE WITHOUT CARDIOVERSION N/A 10/09/2014   Procedure: TRANSESOPHAGEAL ECHOCARDIOGRAM (TEE);  Surgeon: Sanda Klein, MD;  Location: [facility] ENDOSCOPY;  Service: Cardiovascular;  Laterality: N/A;  . TONSILLECTOMY       OB History   None      Home Medications    Prior to Admission medications   Medication Sig Start Date End Date Taking? Authorizing Provider  amLODipine (NORVASC) 10 MG tablet Take 1 tablet (10 mg total) by mouth daily. 12/15/17   [name], NP  atorvastatin (LIPITOR) 80 MG tablet Take 1 tablet (80 mg total) by mouth daily at 6 PM. 12/15/17   [name], NP  clopidogrel (PLAVIX) 75 MG tablet Take 1 tablet (75 mg total) by mouth daily. 12/15/17   [name], NP  flurbiprofen (ANSAID) 100 MG tablet 1 tablet every 8 hours, maximum 3 tablets in 24h 04/22/18   Jaffe, Adam R, DO  gabapentin (NEURONTIN) 300 MG capsule Take 1 capsule (300 mg total) by mouth 2 (two) times daily. 12/15/17   [name], NP  Galcanezumab-gnlm (EMGALITY) 120 MG/ML SOSY Inject 120 mg into the skin every 30 (thirty) days. 04/22/18  Jaffe, Adam R, DO  Galcanezumab-gnlm (EMGALITY) 120 MG/ML SOSY Inject 120 mg into the skin every 30 (thirty) days. 04/28/18   Tomi Likens, Adam R, DO  hydrALAZINE (APRESOLINE) 50 MG tablet TAKE 1 TABLET BY MOUTH 3 TIMES DAILY WITH MEALS. 12/15/17   [name], NP  lisinopril (PRINIVIL,ZESTRIL) 40 MG tablet Take 1 tablet (40 mg total) by mouth daily. 12/15/17   [name], NP  metoCLOPramide (REGLAN) 10 MG tablet Take 1 tablet (10 mg total) by mouth every 6 (six) hours as needed for nausea (and headache). 12/15/17   [name], NP  metoprolol tartrate (LOPRESSOR) 50 MG tablet Take 1 tablet (50 mg total) by mouth 2 (two) times daily. 12/15/17   [name], NP  pantoprazole (PROTONIX) 20 MG tablet Take 1 tablet (20 mg total) by mouth daily. 12/15/17   [name], NP  spironolactone (ALDACTONE) 25 MG tablet Take 1 tablet (25 mg total) by mouth daily. 12/15/17 03/15/18  [name], NP  topiramate (TOPAMAX) 50 MG tablet Take 1 tablet (50 mg total) by mouth daily. 12/15/17   [name], NP  traZODone (DESYREL) 100 MG tablet Take 1 tablet (100 mg total) by mouth at bedtime. 12/15/17   [name], NP  venlafaxine XR (EFFEXOR XR) 75 MG 24 hr capsule Take 3 capsules (225 mg total) by mouth daily with breakfast. 04/22/18   Pieter Partridge, DO    Family History Family History  Problem Relation Age of Onset  . Cancer Mother        type unknown  . Hypertension Mother   . Hypertension Sister   . Diabetes Sister   . Cancer Maternal Aunt        4 aunts died of cancer types unknown    Social History Social History   Tobacco Use  . Smoking status: Former Smoker    Packs/day: 0.25    Types: Cigarettes    Last attempt to quit: 08/31/2017    Years since quitting: 0.6  . Smokeless tobacco: Never Used  . Tobacco comment: Pt. still smoke marijuana.   Substance Use Topics  . Alcohol use: No    Alcohol/week: 0.0 standard drinks  . Drug use: Yes    Types: Marijuana     Allergies   Tylenol [acetaminophen]   Review of Systems Review of Systems  Constitutional: Negative for chills and fever.  Eyes: Negative for visual disturbance.  Respiratory: Negative for shortness of breath.   Cardiovascular: Negative for chest pain.  Gastrointestinal: Negative for nausea and vomiting.  Neurological: Positive for weakness (baseline L sided hemiparesis, no acute change), numbness (baseline L sided hemiparesis, no acute change) and headaches. Negative for dizziness, syncope and speech difficulty.  All  other systems reviewed and are negative.  Physical Exam Updated Vital Signs BP (!) 145/78 (BP Location: Right Arm)   Pulse 63   Temp 99.4 F (37.4 C) (Oral)   Resp 18   Ht 5\' 6"  (1.676 m)   Wt 85.3 kg   SpO2 98%   BMI 30.34 kg/m   Physical Exam  Constitutional: She appears well-developed and well-nourished.  Non-toxic appearance. No distress.  HENT:  Head: Normocephalic and atraumatic.  Eyes: Pupils are equal, round, and reactive to light. Conjunctivae and EOM are normal. Right eye exhibits no discharge. Left eye exhibits no discharge.  No proptosis.   Neck: Neck supple. No neck rigidity. No edema and no erythema present.  Cardiovascular: Normal rate and regular  rhythm.  Pulmonary/Chest: Effort normal and breath sounds normal. No respiratory distress. She has no wheezes. She has no rhonchi. She has no rales.  Respiration even and unlabored  Abdominal: Soft. She exhibits no distension. There is no tenderness.  Neurological: She is alert.  Clear speech. CN III-XII grossly intact. Negative pronator drift. Decreased strength/sensation to LUE/LLE which patient states is baseline. Able to perform finger to nose bilaterally with negative pronator drift.   Skin: Skin is warm and dry. No rash noted.  Psychiatric: She has a normal mood and affect. Her behavior is normal.  Nursing note and vitals reviewed.    ED Treatments / Results  Labs (all labs ordered are listed, but only abnormal results are displayed) Labs Reviewed - No data to display  EKG None  Radiology No results found.  Procedures Procedures (including critical care time)  Medications Ordered in ED Medications  prochlorperazine (COMPAZINE) injection 10 mg (has no administration in time range)  diphenhydrAMINE (BENADRYL) injection 25 mg (has no administration in time range)  sodium chloride 0.9 % bolus 500 mL (has no administration in time range)     Initial Impression / Assessment and Plan / ED Course  I have  reviewed the triage vital signs and the nursing notes.  Pertinent labs & imaging results that were available during my care of the patient were reviewed by me and considered in my medical decision making (see chart for details).    Patient presents with complaint of headache. Patient is nontoxic appearing, vitals WNL other than mildly elevated BP- doubt HTN emergency, patient reports being out of medicines this evening- she has refills available to obtain tomorrow- plans to call pharmacy. Patient has hx of similar headaches, gradual onset with steady progression in severity. Pt is afebrile with no acute focal neuro deficits (baseline L hemiparesis), dizziness, change in vision, proptosis, or nuchal rigidity. Non concerning for [facility], ICH, ischemic CVA, dural venous sinus thrombosis, acute glaucoma, giant cell arteritis, mass, or meningitis. Had similar presentation however w/ dizziness in June of this year with CT negative for acute abnormality.   Patient treated for headache with migraine cocktail with significant improvement, feels ready to go home. I discussed treatment plan, need for PCP follow-up, and return precautions with the patient. Provided opportunity for questions, patient confirmed understanding and is in agreement with plan.    Final Clinical Impressions(s) / ED Diagnoses   Final diagnoses:  Acute nonintractable headache, unspecified headache type    ED Discharge Orders    None       Leafy Kindle 05/09/18 2009    Virgel Manifold, MD 05/13/18 [phone]

## 2018-05-10 MED FILL — GABAPENTIN 300 MG CAPSULE: 300 | 30 days supply | Qty: 60 | Fill #2

## 2018-05-10 MED FILL — TOPIRAMATE 50 MG TABLET: 50 | 30 days supply | Qty: 30 | Fill #2

## 2018-05-10 MED FILL — ATORVASTATIN 80 MG TABLET: 80 | 30 days supply | Qty: 30 | Fill #2

## 2018-05-10 MED FILL — METOPROLOL TARTRATE 50 MG T: 50 | 30 days supply | Qty: 60 | Fill #2

## 2018-05-10 MED FILL — PANTOPRAZOLE SOD DR 20 MG T: 20 | 30 days supply | Qty: 30 | Fill #2

## 2018-05-10 MED FILL — SPIRONOLACTONE 25 MG TABLET: 25 | 30 days supply | Qty: 30 | Fill #2

## 2018-05-10 MED FILL — VENLAFAXINE HCL ER 150 MG C: 150 | 30 days supply | Qty: 30 | Fill #0

## 2018-05-10 MED FILL — CLOPIDOGREL 75 MG TABLET: 75 | 30 days supply | Qty: 30 | Fill #2

## 2018-05-10 MED FILL — AMLODIPINE BESYLATE 10 MG T: 10 | 30 days supply | Qty: 30 | Fill #2

## 2018-05-10 MED FILL — hydrALAZINE HCL 50 MG TABS: 50 | 30 days supply | Qty: 90 | Fill #2

## 2018-05-10 MED FILL — LISINOPRIL 40 MG TABLET: 40 | 30 days supply | Qty: 30 | Fill #2

## 2018-05-11 ENCOUNTER — Telehealth: Payer: Self-pay | Admitting: Neurology

## 2018-05-11 NOTE — Telephone Encounter (Signed)
Patient has been experiencing headaches, BP has been up and she just hasn't been feeling well. Please call her back at [phone]. Thanks!

## 2018-05-11 NOTE — Telephone Encounter (Signed)
Called and spoke with Pt. I asked her has she contacted her PCP concerning her increased BP, she said she has called them and they made her an appt for 12/20. She was seen in the ED on 12/8 for her headache. She did not come by to get Emgality. She will have her friend come by today to get sample.

## 2018-05-17 ENCOUNTER — Telehealth: Payer: Self-pay | Admitting: Nurse Practitioner

## 2018-05-17 NOTE — Telephone Encounter (Signed)
Pt called to request more information on having her medication mailed to her address, please follow up with options

## 2018-05-18 NOTE — Telephone Encounter (Signed)
Called patient to discuss options on delivery/mail order for her meds. She confirms that she has Medicaid so we will utilize a local independent that offers free delivery.  After looking in her area, [facility] is a great independent that recieved high praise from some of our own in-house pharmacists. The patient stated that she was slightly confused with our conversation over the phone so we will discuss this further when she comes for her appt this Friday 05/21/18.   [facility]: [address], [place] [address]  [phone]  Free delivery once weekly, additional deliveries $6/each. They happily accept her insurance, Medicaid.

## 2018-05-21 ENCOUNTER — Encounter: Payer: Self-pay | Admitting: Critical Care Medicine

## 2018-05-21 ENCOUNTER — Ambulatory Visit: Payer: Medicare Other | Attending: Critical Care Medicine | Admitting: Critical Care Medicine

## 2018-05-21 VITALS — BP 97/61 | HR 57 | Temp 97.3°F | Ht 66.0 in | Wt 191.8 lb

## 2018-05-21 DIAGNOSIS — Z8249 Family history of ischemic heart disease and other diseases of the circulatory system: Secondary | ICD-10-CM | POA: Insufficient documentation

## 2018-05-21 DIAGNOSIS — E785 Hyperlipidemia, unspecified: Secondary | ICD-10-CM | POA: Diagnosis not present

## 2018-05-21 DIAGNOSIS — F329 Major depressive disorder, single episode, unspecified: Secondary | ICD-10-CM | POA: Diagnosis not present

## 2018-05-21 DIAGNOSIS — I69354 Hemiplegia and hemiparesis following cerebral infarction affecting left non-dominant side: Secondary | ICD-10-CM | POA: Insufficient documentation

## 2018-05-21 DIAGNOSIS — Z886 Allergy status to analgesic agent status: Secondary | ICD-10-CM | POA: Diagnosis not present

## 2018-05-21 DIAGNOSIS — F419 Anxiety disorder, unspecified: Secondary | ICD-10-CM | POA: Diagnosis not present

## 2018-05-21 DIAGNOSIS — Z87891 Personal history of nicotine dependence: Secondary | ICD-10-CM | POA: Diagnosis not present

## 2018-05-21 DIAGNOSIS — Z79899 Other long term (current) drug therapy: Secondary | ICD-10-CM | POA: Insufficient documentation

## 2018-05-21 DIAGNOSIS — Z7902 Long term (current) use of antithrombotics/antiplatelets: Secondary | ICD-10-CM | POA: Diagnosis not present

## 2018-05-21 DIAGNOSIS — R51 Headache: Secondary | ICD-10-CM | POA: Diagnosis not present

## 2018-05-21 DIAGNOSIS — Z8673 Personal history of transient ischemic attack (TIA), and cerebral infarction without residual deficits: Secondary | ICD-10-CM | POA: Diagnosis not present

## 2018-05-21 DIAGNOSIS — I119 Hypertensive heart disease without heart failure: Secondary | ICD-10-CM | POA: Diagnosis not present

## 2018-05-21 DIAGNOSIS — M1990 Unspecified osteoarthritis, unspecified site: Secondary | ICD-10-CM | POA: Diagnosis not present

## 2018-05-21 DIAGNOSIS — R519 Headache, unspecified: Secondary | ICD-10-CM

## 2018-05-21 DIAGNOSIS — G43909 Migraine, unspecified, not intractable, without status migrainosus: Secondary | ICD-10-CM

## 2018-05-21 DIAGNOSIS — G4733 Obstructive sleep apnea (adult) (pediatric): Secondary | ICD-10-CM | POA: Insufficient documentation

## 2018-05-21 DIAGNOSIS — I10 Essential (primary) hypertension: Secondary | ICD-10-CM

## 2018-05-21 HISTORY — DX: Migraine, unspecified, not intractable, without status migrainosus: G43.909

## 2018-05-21 NOTE — Assessment & Plan Note (Signed)
Recurrent severe migraine headaches in the setting of stroke history  Plan Continue treatment of migraines per neurology No new medications offered

## 2018-05-21 NOTE — Assessment & Plan Note (Signed)
History of ischemic middle cerebral artery stroke secondary to embolic factors from aortic vegetation  There is residual left sided paresis  The patient needs a cane and also a walker at home  Recent right knee orthopedic issues is exacerbating patient's mobility  The patient is status post recent physical therapy

## 2018-05-21 NOTE — Patient Instructions (Signed)
No change in any of your medications Continue to follow-up with your neurologist We will schedule a primary care visit in January

## 2018-05-21 NOTE — Progress Notes (Signed)
Subjective:    Patient ID: Brandy Roberts, female    DOB: 1971/04/25, 47 y.o.   MRN: [id number]  47 y.o. F here for post ED f/u of headaches. Dx migraine , resolved with migraine Cocktail. IV Meds.  PMHx   HTN, hyperlipidemia, prior stroke (w/ residual L hemiparesis), migraines, and OSA  Pt went to neurology MD after ED visit and now uses a once a month injection for migraines.  Still with slight headaches.  See if the injections will help.  ? If related to stroke history.   Stays on BP meds  Pt fell two days ago and the back hurts  .  Arthritis on knee  Migraine   This is a chronic problem. The current episode started more than 1 year ago. The problem occurs daily. The problem has been waxing and waning (migraine is better since injection but slight H/A persist). The pain is located in the bilateral region. The pain does not radiate. The quality of the pain is described as stabbing. The pain is moderate. Associated symptoms include back pain, blurred vision, dizziness, eye pain, eye watering, facial sweating, a loss of balance, neck pain, photophobia, scalp tenderness, tingling, a visual change and weakness. Pertinent negatives include no hearing loss, nausea, rhinorrhea, seizures, sinus pressure, sore throat, tinnitus or vomiting. The symptoms are aggravated by emotional stress, noise and bright light. Her past medical history is significant for hypertension.    Past Medical History:  Diagnosis Date  . Anemia   . Anxiety   . Depression   . Hyperlipidemia   . Hypertension   . Migraines   . Osteoarthritis   . Sleep apnea   . Stroke [facility])      Family History  Problem Relation Age of Onset  . Cancer Mother        type unknown  . Hypertension Mother   . Hypertension Sister   . Diabetes Sister   . Cancer Maternal Aunt        4 aunts died of cancer types unknown     Social History   Socioeconomic History  . Marital status: Single    Spouse name: Not on file  . Number of  children: 2  . Years of education: 110  . Highest education level: Not on file  Occupational History  . Occupation: N/A  Social Needs  . Financial resource strain: Not on file  . Food insecurity:    Worry: Not on file    Inability: Not on file  . Transportation needs:    Medical: Not on file    Non-medical: Not on file  Tobacco Use  . Smoking status: Former Smoker    Packs/day: 0.25    Types: Cigarettes    Last attempt to quit: 08/31/2017    Years since quitting: 0.7  . Smokeless tobacco: Never Used  . Tobacco comment: Pt. still smoke marijuana.   Substance and Sexual Activity  . Alcohol use: No    Alcohol/week: 0.0 standard drinks  . Drug use: Yes    Types: Marijuana  . Sexual activity: Yes    Birth control/protection: Post-menopausal  Lifestyle  . Physical activity:    Days per week: Not on file    Minutes per session: Not on file  . Stress: Not on file  Relationships  . Social connections:    Talks on phone: Not on file    Gets together: Not on file    Attends religious service: Not on file  Active member of club or organization: Not on file    Attends meetings of clubs or organizations: Not on file    Relationship status: Not on file  . Intimate partner violence:    Fear of current or ex partner: Not on file    Emotionally abused: Not on file    Physically abused: Not on file    Forced sexual activity: Not on file  Other Topics Concern  . Not on file  Social History Narrative   Patient lives with her uncle in a one story home.  Has 2 sons.  Currently on disability.  Education: high school.   Drinks 1-2 sodas a week      Allergies  Allergen Reactions  . Tylenol [Acetaminophen] Other (See Comments)    Pt stated tylenol gives her extreme headache     Outpatient Medications Prior to Visit  Medication Sig Dispense Refill  . amLODipine (NORVASC) 10 MG tablet Take 1 tablet (10 mg total) by mouth daily. 90 tablet 1  . atorvastatin (LIPITOR) 80 MG tablet Take 1  tablet (80 mg total) by mouth daily at 6 PM. 90 tablet 3  . clopidogrel (PLAVIX) 75 MG tablet Take 1 tablet (75 mg total) by mouth daily. 90 tablet 3  . gabapentin (NEURONTIN) 300 MG capsule Take 1 capsule (300 mg total) by mouth 2 (two) times daily. 60 capsule 2  . Galcanezumab-gnlm (EMGALITY) 120 MG/ML SOSY Inject 120 mg into the skin every 30 (thirty) days. 1 Syringe 11  . Galcanezumab-gnlm (EMGALITY) 120 MG/ML SOSY Inject 120 mg into the skin every 30 (thirty) days. 1 Syringe 11  . hydrALAZINE (APRESOLINE) 50 MG tablet TAKE 1 TABLET BY MOUTH 3 TIMES DAILY WITH MEALS. 90 tablet 5  . lisinopril (PRINIVIL,ZESTRIL) 40 MG tablet Take 1 tablet (40 mg total) by mouth daily. 90 tablet 1  . metoCLOPramide (REGLAN) 10 MG tablet Take 1 tablet (10 mg total) by mouth every 6 (six) hours as needed for nausea (and headache). 30 tablet 2  . metoprolol tartrate (LOPRESSOR) 50 MG tablet Take 1 tablet (50 mg total) by mouth 2 (two) times daily. 60 tablet 5  . topiramate (TOPAMAX) 50 MG tablet Take 1 tablet (50 mg total) by mouth daily. 90 tablet 1  . traZODone (DESYREL) 100 MG tablet Take 1 tablet (100 mg total) by mouth at bedtime. 90 tablet 1  . venlafaxine XR (EFFEXOR XR) 75 MG 24 hr capsule Take 3 capsules (225 mg total) by mouth daily with breakfast. 90 capsule 3  . flurbiprofen (ANSAID) 100 MG tablet 1 tablet every 8 hours, maximum 3 tablets in 24h (Patient not taking: Reported on 05/21/2018) 24 tablet 3  . pantoprazole (PROTONIX) 20 MG tablet Take 1 tablet (20 mg total) by mouth daily. (Patient not taking: Reported on 05/21/2018) 90 tablet 1  . spironolactone (ALDACTONE) 25 MG tablet Take 1 tablet (25 mg total) by mouth daily. 90 tablet 1   No facility-administered medications prior to visit.      Review of Systems  HENT: Negative for hearing loss, rhinorrhea, sinus pressure, sore throat and tinnitus.   Eyes: Positive for blurred vision, photophobia and pain.  Gastrointestinal: Negative for nausea and  vomiting.  Musculoskeletal: Positive for back pain and neck pain.  Neurological: Positive for dizziness, tingling, weakness and loss of balance. Negative for seizures.       Objective:   Physical Exam Vitals:   05/21/18 0857  BP: 97/61  Pulse: (!) 57  Temp: (!) 97.3 F (  36.3 C)  TempSrc: Oral  SpO2: 99%  Weight: 191 lb 12.8 oz (87 kg)  Height: 5\' 6"  (1.676 m)    Gen: Pleasant, well-nourished, in no distress,  normal affect  ENT: No lesions,  mouth clear,  oropharynx clear, no postnasal drip  Neck: No JVD, no TMG, no carotid bruits  Lungs: No use of accessory muscles, no dullness to percussion, clear without rales or rhonchi  Cardiovascular: RRR, heart sounds normal, no murmur or gallops, no peripheral edema  Abdomen: soft and NT, no HSM,  BS normal  Musculoskeletal: No deformities, no cyanosis or clubbing  Neuro: alert, left-sided weakness and using a cane the patient is able to ambulate short distance without difficulty  Skin: Warm, no lesions or rashes  No results found.  All laboratory data from recent emergency room visit are reviewed BMP Latest Ref Rng & Units 11/11/2017 02/23/2017 01/12/2017  Glucose 65 - 99 mg/dL 106(H) 81 98  BUN 6 - 20 mg/dL 16 20 21(H)  Creatinine 0.44 - 1.00 mg/dL 0.98 0.99 0.90  BUN/Creat Ratio 9 - 23 - 20 -  Sodium 135 - 145 mmol/L 141 147(H) 143  Potassium 3.5 - 5.1 mmol/L 3.7 3.8 4.0  Chloride 101 - 111 mmol/L 110 108(H) 107  CO2 22 - 32 mmol/L 23 24 -  Calcium 8.9 - 10.3 mg/dL 9.2 9.5 -   Lab Results  Component Value Date   WBC 8.2 11/11/2017   HGB 12.8 11/11/2017   HCT 39.1 11/11/2017   MCV 85.6 11/11/2017   PLT 252 11/11/2017         Assessment & Plan:  I personally reviewed all images and lab data in the [facility] system as well as any outside material available during this office visit and agree with the  radiology impressions.   History of ischemic middle cerebral artery stroke embolic History of ischemic middle cerebral  artery stroke secondary to embolic factors from aortic vegetation  There is residual left sided paresis  The patient needs a cane and also a walker at home  Recent right knee orthopedic issues is exacerbating patient's mobility  The patient is status post recent physical therapy   Migraine Recurrent severe migraine headaches in the setting of stroke history  Plan Continue treatment of migraines per neurology No new medications offered  Essential hypertension Essential hypertension under good control  Current medication profile will continue for hypertension  Hypertensive heart disease Hypertensive heart disease Continue current blood pressure medication profile   Janyra was seen today for headache.  Diagnoses and all orders for this visit:  Migraine without status migrainosus, not intractable, unspecified migraine type  Hypertensive heart disease without heart failure  Nonintractable headache, unspecified chronicity pattern, unspecified headache type  History of ischemic middle cerebral artery stroke embolic  Essential hypertension

## 2018-05-21 NOTE — Assessment & Plan Note (Signed)
Hypertensive heart disease Continue current blood pressure medication profile

## 2018-05-21 NOTE — Assessment & Plan Note (Signed)
Essential hypertension under good control  Current medication profile will continue for hypertension

## 2018-06-11 ENCOUNTER — Telehealth: Payer: Self-pay

## 2018-06-11 NOTE — Telephone Encounter (Signed)
Rcvd call from [place] with authorization for Emgality. Gave clinicals verbally. We will receive fax with determination.

## 2018-06-15 ENCOUNTER — Ambulatory Visit: Payer: Medicare Other | Admitting: Cardiology

## 2018-06-25 ENCOUNTER — Ambulatory Visit: Payer: Medicare Other | Admitting: Nurse Practitioner

## 2018-06-29 ENCOUNTER — Ambulatory Visit (INDEPENDENT_AMBULATORY_CARE_PROVIDER_SITE_OTHER): Payer: Medicare Other | Admitting: Cardiology

## 2018-06-29 ENCOUNTER — Encounter: Payer: Self-pay | Admitting: Cardiology

## 2018-06-29 VITALS — BP 126/66 | HR 60 | Ht 66.0 in | Wt 189.1 lb

## 2018-06-29 DIAGNOSIS — I10 Essential (primary) hypertension: Secondary | ICD-10-CM

## 2018-06-29 DIAGNOSIS — Z87891 Personal history of nicotine dependence: Secondary | ICD-10-CM

## 2018-06-29 DIAGNOSIS — I351 Nonrheumatic aortic (valve) insufficiency: Secondary | ICD-10-CM | POA: Diagnosis not present

## 2018-06-29 DIAGNOSIS — I6930 Unspecified sequelae of cerebral infarction: Secondary | ICD-10-CM

## 2018-06-29 DIAGNOSIS — R0602 Shortness of breath: Secondary | ICD-10-CM | POA: Diagnosis not present

## 2018-06-29 NOTE — Patient Instructions (Signed)
Medication Instructions:  Your physician recommends that you continue on your current medications as directed. Please refer to the Current Medication list given to you today.  If you need a refill on your cardiac medications before your next appointment, please call your pharmacy.   Lab work: None  If you have labs (blood work) drawn today and your tests are completely normal, you will receive your results only by: [name] MyChart Message (if you have MyChart) OR . A paper copy in the mail If you have any lab test that is abnormal or we need to change your treatment, we will call you to review the results.  Testing/Procedures: Your physician has requested that you have an echocardiogram. Echocardiography is a painless test that uses sound waves to create images of your heart. It provides your doctor with information about the size and shape of your heart and how well your heart's chambers and valves are working. This procedure takes approximately one hour. There are no restrictions for this procedure.   Follow-Up: At [facility], you and your health needs are our priority.  As part of our continuing mission to provide you with exceptional heart care, we have created designated Provider Care Teams.  These Care Teams include your primary Cardiologist (physician) and Advanced Practice Providers (APPs -  Physician Assistants and Nurse Practitioners) who all work together to provide you with the care you need, when you need it. You will need a follow up appointment in 6 months.  Please call our office 2 months in advance to schedule this appointment.      Echocardiogram An echocardiogram is a procedure that uses painless sound waves (ultrasound) to produce an image of the heart. Images from an echocardiogram can provide important information about:  Signs of coronary artery disease (CAD).  Aneurysm detection. An aneurysm is a weak or damaged part of an artery wall that bulges out from the normal  force of blood pumping through the body.  Heart size and shape. Changes in the size or shape of the heart can be associated with certain conditions, including heart failure, aneurysm, and CAD.  Heart muscle function.  Heart valve function.  Signs of a past heart attack.  Fluid buildup around the heart.  Thickening of the heart muscle.  A tumor or infectious growth around the heart valves. Tell a health care provider about:  Any allergies you have.  All medicines you are taking, including vitamins, herbs, eye drops, creams, and over-the-counter medicines.  Any blood disorders you have.  Any surgeries you have had.  Any medical conditions you have.  Whether you are pregnant or may be pregnant. What are the risks? Generally, this is a safe procedure. However, problems may occur, including:  Allergic reaction to dye (contrast) that may be used during the procedure. What happens before the procedure? No specific preparation is needed. You may eat and drink normally. What happens during the procedure?   An IV tube may be inserted into one of your veins.  You may receive contrast through this tube. A contrast is an injection that improves the quality of the pictures from your heart.  A gel will be applied to your chest.  A wand-like tool (transducer) will be moved over your chest. The gel will help to transmit the sound waves from the transducer.  The sound waves will harmlessly bounce off of your heart to allow the heart images to be captured in real-time motion. The images will be recorded on a computer.  The procedure may vary among health care providers and hospitals. What happens after the procedure?  You may return to your normal, everyday life, including diet, activities, and medicines, unless your health care provider tells you not to do that. Summary  An echocardiogram is a procedure that uses painless sound waves (ultrasound) to produce an image of the  heart.  Images from an echocardiogram can provide important information about the size and shape of your heart, heart muscle function, heart valve function, and fluid buildup around your heart.  You do not need to do anything to prepare before this procedure. You may eat and drink normally.  After the echocardiogram is completed, you may return to your normal, everyday life, unless your health care provider tells you not to do that. This information is not intended to replace advice given to you by your health care provider. Make sure you discuss any questions you have with your health care provider. Document Released: 05/16/2000 Document Revised: 06/21/2016 Document Reviewed: 06/21/2016 Elsevier Interactive Patient Education  2019 [employer].

## 2018-06-29 NOTE — Progress Notes (Signed)
Cardiology Office Note:    Date:  06/29/2018   ID:  Brandy Roberts, DOB 02-05-1971, MRN [id number]  PCP:  [name], NP  Cardiologist:  Jenne Campus, MD    Referring MD: [name], NP   Chief Complaint  Patient presents with  . Follow-up  Doing well  History of Present Illness:    Brandy Roberts is a 48 y.o. female with history of CVA as well as aortic insufficiency she is a patient of Dr. Wynonia Lawman and she comes today to office to be established as a patient in our practice.  Denies having any chest pain tightness squeezing pressure burning chest she described to have exertional shortness of breath she thinks exertional shortness of breath is slightly worse than previously.  Denies having any dizziness or passing out because of left sided hemiparesis she should use a cane but does not do it described to have a few falls but none recently.  Past Medical History:  Diagnosis Date  . Anemia   . Anxiety   . Depression   . Hyperlipidemia   . Hypertension   . Migraines   . Osteoarthritis   . Sleep apnea   . Stroke Brandy Roberts)     Past Surgical History:  Procedure Laterality Date  . CESAREAN SECTION    . GANGLION CYST EXCISION    . TEE WITHOUT CARDIOVERSION N/A 10/09/2014   Procedure: TRANSESOPHAGEAL ECHOCARDIOGRAM (TEE);  Surgeon: Sanda Klein, MD;  Location: [facility] ENDOSCOPY;  Service: Cardiovascular;  Laterality: N/A;  . TONSILLECTOMY      Current Medications: Current Meds  Medication Sig  . amLODipine (NORVASC) 10 MG tablet Take 1 tablet (10 mg total) by mouth daily.  [name] atorvastatin (LIPITOR) 80 MG tablet Take 1 tablet (80 mg total) by mouth daily at 6 PM.  . clopidogrel (PLAVIX) 75 MG tablet Take 1 tablet (75 mg total) by mouth daily.  [name] gabapentin (NEURONTIN) 300 MG capsule Take 1 capsule (300 mg total) by mouth 2 (two) times daily.  . Galcanezumab-gnlm (EMGALITY) 120 MG/ML SOSY Inject 120 mg into the skin every 30 (thirty) days.  . hydrALAZINE (APRESOLINE)  50 MG tablet TAKE 1 TABLET BY MOUTH 3 TIMES DAILY WITH MEALS.  [name] lisinopril (PRINIVIL,ZESTRIL) 40 MG tablet Take 1 tablet (40 mg total) by mouth daily.  . metoCLOPramide (REGLAN) 10 MG tablet Take 1 tablet (10 mg total) by mouth every 6 (six) hours as needed for nausea (and headache).  . metoprolol tartrate (LOPRESSOR) 50 MG tablet Take 1 tablet (50 mg total) by mouth 2 (two) times daily.  . pantoprazole (PROTONIX) 20 MG tablet Take 1 tablet (20 mg total) by mouth daily.  [name] spironolactone (ALDACTONE) 25 MG tablet Take 1 tablet (25 mg total) by mouth daily.  [name] topiramate (TOPAMAX) 50 MG tablet Take 1 tablet (50 mg total) by mouth daily.  . traZODone (DESYREL) 100 MG tablet Take 1 tablet (100 mg total) by mouth at bedtime.  [name] venlafaxine XR (EFFEXOR XR) 75 MG 24 hr capsule Take 3 capsules (225 mg total) by mouth daily with breakfast.     Allergies:   Ibuprofen and Tylenol [acetaminophen]   Social History   Socioeconomic History  . Marital status: Single    Spouse name: Not on file  . Number of children: 2  . Years of education: 67  . Highest education level: Not on file  Occupational History  . Occupation: N/A  Social Needs  . Financial resource strain: Not on file  . Food  insecurity:    Worry: Not on file    Inability: Not on file  . Transportation needs:    Medical: Not on file    Non-medical: Not on file  Tobacco Use  . Smoking status: Current Some Day Smoker    Packs/day: 0.25    Types: Cigarettes    Last attempt to quit: 08/31/2017    Years since quitting: 0.8  . Smokeless tobacco: Never Used  . Tobacco comment: Pt. still smoke marijuana.   Substance and Sexual Activity  . Alcohol use: No    Alcohol/week: 0.0 standard drinks  . Drug use: Yes    Types: Marijuana  . Sexual activity: Yes    Birth control/protection: Post-menopausal  Lifestyle  . Physical activity:    Days per week: Not on file    Minutes per session: Not on file  . Stress: Not on file  Relationships    . Social connections:    Talks on phone: Not on file    Gets together: Not on file    Attends religious service: Not on file    Active member of club or organization: Not on file    Attends meetings of clubs or organizations: Not on file    Relationship status: Not on file  Other Topics Concern  . Not on file  Social History Narrative   Patient lives with her uncle in a one story home.  Has 2 sons.  Currently on disability.  Education: high school.   Drinks 1-2 sodas a week      Family History: The patient's family history includes Cancer in her maternal aunt and mother; Diabetes in her sister; Hypertension in her mother and sister. ROS:   Please see the history of present illness.    All 14 point review of systems negative except as described per history of present illness  EKGs/Labs/Other Studies Reviewed:      Recent Labs: 11/11/2017: BUN 16; Creatinine, Ser 0.98; Hemoglobin 12.8; Platelets 252; Potassium 3.7; Sodium 141  Recent Lipid Panel    Component Value Date/Time   CHOL 111 12/15/2017 1444   TRIG 61 12/15/2017 1444   HDL 42 12/15/2017 1444   CHOLHDL 2.6 12/15/2017 1444   CHOLHDL 3.3 10/25/2016 0248   VLDL 8 10/25/2016 0248   LDLCALC 57 12/15/2017 1444    Physical Exam:    VS:  BP 126/66   Pulse 60   Ht 5\' 6"  (1.676 m)   Wt 189 lb 1.9 oz (85.8 kg)   SpO2 98%   BMI 30.52 kg/m     Wt Readings from Last 3 Encounters:  06/29/18 189 lb 1.9 oz (85.8 kg)  05/21/18 191 lb 12.8 oz (87 kg)  05/09/18 188 lb (85.3 kg)     GEN:  Well nourished, well developed in no acute distress HEENT: Normal NECK: No JVD; No carotid bruits LYMPHATICS: No lymphadenopathy CARDIAC: RRR, soft diastolic murmur best heard at the right upper portion of the sternum grade 1/6, no rubs, no gallops RESPIRATORY:  Clear to auscultation without rales, wheezing or rhonchi  ABDOMEN: Soft, non-tender, non-distended MUSCULOSKELETAL:  No edema; No deformity  SKIN: Warm and dry LOWER  EXTREMITIES: no swelling NEUROLOGIC:  Alert and oriented x 3 PSYCHIATRIC:  Normal affect   ASSESSMENT:    1. Nonrheumatic aortic valve insufficiency   2. Chronic ischemic vertebrobasilar artery thalamic stroke   3. Essential hypertension   4. Former smoker    PLAN:    In order of problems listed above:  1. Aortic insufficiency I will ask her to have echocardiogram to check on degree of regurgitation as well as left ventricle size and function.  Based on her story I am concerned because she described a little more shortness of breath than previously.  Note chest pain.   2. Essential hypertension blood pressure appears to be well controlled we will continue present management. 3. Dyslipidemia last fasting lipid profile from summer is excellent.  We will continue present management. 4. Former smoker she still admits to smoke few cigarettes a month I told her she should not do it.  We talked about healthy lifestyle exercises on the regular basis when she is trying to do.  I see her back in my office in about 6 months echocardiogram will be done.   Medication Adjustments/Labs and Tests Ordered: Current medicines are reviewed at length with the patient today.  Concerns regarding medicines are outlined above.  No orders of the defined types were placed in this encounter.  Medication changes: No orders of the defined types were placed in this encounter.   Signed, Park Liter, MD, [facility] 06/29/2018 9:17 AM    [place]

## 2018-07-08 ENCOUNTER — Ambulatory Visit (HOSPITAL_BASED_OUTPATIENT_CLINIC_OR_DEPARTMENT_OTHER)
Admission: RE | Admit: 2018-07-08 | Discharge: 2018-07-08 | Disposition: A | Discharge status: Home / Self Care | Payer: Medicare Other | Source: Ambulatory Visit | Attending: Cardiology | Admitting: Cardiology

## 2018-07-08 DIAGNOSIS — R0602 Shortness of breath: Secondary | ICD-10-CM | POA: Insufficient documentation

## 2018-07-08 DIAGNOSIS — I10 Essential (primary) hypertension: Secondary | ICD-10-CM | POA: Diagnosis not present

## 2018-07-08 DIAGNOSIS — I351 Nonrheumatic aortic (valve) insufficiency: Secondary | ICD-10-CM | POA: Diagnosis not present

## 2018-07-08 NOTE — Progress Notes (Signed)
  Echocardiogram 2D Echocardiogram has been performed.  Brandy Roberts 07/08/2018, 2:37 PM

## 2018-07-14 ENCOUNTER — Encounter: Payer: Self-pay | Admitting: Nurse Practitioner

## 2018-07-14 ENCOUNTER — Ambulatory Visit: Payer: Medicare Other | Attending: Nurse Practitioner | Admitting: Nurse Practitioner

## 2018-07-14 VITALS — BP 108/69 | HR 55 | Temp 98.6°F | Ht 66.0 in | Wt 190.2 lb

## 2018-07-14 DIAGNOSIS — M1990 Unspecified osteoarthritis, unspecified site: Secondary | ICD-10-CM | POA: Diagnosis not present

## 2018-07-14 DIAGNOSIS — I69354 Hemiplegia and hemiparesis following cerebral infarction affecting left non-dominant side: Secondary | ICD-10-CM | POA: Diagnosis not present

## 2018-07-14 DIAGNOSIS — Z7901 Long term (current) use of anticoagulants: Secondary | ICD-10-CM | POA: Diagnosis not present

## 2018-07-14 DIAGNOSIS — Z8249 Family history of ischemic heart disease and other diseases of the circulatory system: Secondary | ICD-10-CM | POA: Insufficient documentation

## 2018-07-14 DIAGNOSIS — E785 Hyperlipidemia, unspecified: Secondary | ICD-10-CM | POA: Insufficient documentation

## 2018-07-14 DIAGNOSIS — Z79899 Other long term (current) drug therapy: Secondary | ICD-10-CM | POA: Diagnosis not present

## 2018-07-14 DIAGNOSIS — I429 Cardiomyopathy, unspecified: Secondary | ICD-10-CM | POA: Diagnosis not present

## 2018-07-14 DIAGNOSIS — G43709 Chronic migraine without aura, not intractable, without status migrainosus: Secondary | ICD-10-CM | POA: Diagnosis not present

## 2018-07-14 DIAGNOSIS — R7303 Prediabetes: Secondary | ICD-10-CM | POA: Diagnosis not present

## 2018-07-14 DIAGNOSIS — I351 Nonrheumatic aortic (valve) insufficiency: Secondary | ICD-10-CM | POA: Insufficient documentation

## 2018-07-14 DIAGNOSIS — I10 Essential (primary) hypertension: Secondary | ICD-10-CM | POA: Diagnosis not present

## 2018-07-14 LAB — POCT GLYCOSYLATED HEMOGLOBIN (HGB A1C): Hemoglobin A1C: 5.8 % — AB (ref 4.0–5.6)

## 2018-07-14 LAB — GLUCOSE, POCT (MANUAL RESULT ENTRY): POC GLUCOSE: 104 mg/dL — AB (ref 70–99)

## 2018-07-14 MED ORDER — FLURBIPROFEN 100 MG PO TABS: 100.0000 mg | ORAL_TABLET | Freq: Three times a day (TID) | ORAL | 3 refills | Status: AC | PRN

## 2018-07-14 NOTE — Progress Notes (Signed)
Assessment & Plan:  Brandy Roberts was seen today for establish care.  Diagnoses and all orders for this visit:  Prediabetes -     Glucose (CBG) -     HgB A1c  Essential hypertension -     amLODipine (NORVASC) 10 MG tablet; Take 1 tablet (10 mg total) by mouth daily. -     lisinopril (PRINIVIL,ZESTRIL) 40 MG tablet; Take 1 tablet (40 mg total) by mouth daily. -     metoprolol tartrate (LOPRESSOR) 50 MG tablet; Take 1 tablet (50 mg total) by mouth 2 (two) times daily. -     spironolactone (ALDACTONE) 25 MG tablet; Take 1 tablet (25 mg total) by mouth daily.  Chronic migraine without aura without status migrainosus, not intractable -     flurbiprofen (ANSAID) 100 MG tablet; Take 1 tablet (100 mg total) by mouth every 8 (eight) hours as needed for up to 30 days. Maximum 3 tablets in 24 hours    Patient has been counseled on age-appropriate routine health concerns for screening and prevention. These are reviewed and up-to-date. Referrals have been placed accordingly. Immunizations are up-to-date or declined.    Subjective:   Chief Complaint  Patient presents with  . Establish Care    Pt. is here for hypertension.    HPI Brandy Roberts 48 y.o. female presents to office today for follow up.  She has a significant cardiac history including Cardiomyopathy (seeing Cardiology Dr. 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  PMH also includes migraines (taking ansaid prescribed by Neurology).   Hypertension Chronic and well controlled. Current medications include amlodipine 10 mg, hydralazine 50 mg TID, lisinopril 40mg , spironolactone 25 mg and lopressor 50mg  BID. Denies chest pain, shortness of breath, palpitations, lightheadedness, dizziness, headaches or BLE edema.  BP Readings from Last 3 Encounters:  07/14/18 108/69  06/29/18 126/66  05/21/18 97/61     Prediabetes Stable and controlled with diet.  She denies any hypo or hyperglycemic symptoms. Currently does not take any oral anti diabetic agents.  Lab Results  Component Value Date   HGBA1C 5.8 (A) 07/14/2018    Hyperlipidemia LDL at goal. She denies any statin intolerance. Taking lipitor 80 mg as prescribed.  Lab Results  Component Value Date   LDLCALC 57 12/15/2017   Review of Systems  Constitutional: Negative for fever, malaise/fatigue and weight loss.  HENT: Negative.  Negative for nosebleeds.   Eyes: Negative.  Negative for blurred vision, double vision and photophobia.  Respiratory: Positive for shortness of breath. Negative for cough.   Cardiovascular: Negative.  Negative for chest pain, palpitations and leg swelling.  Gastrointestinal: Negative.  Negative for heartburn, nausea and vomiting.  Musculoskeletal: Positive for joint pain. Negative for myalgias.  Neurological: Positive for weakness and headaches. Negative for dizziness, focal weakness and seizures.  Psychiatric/Behavioral: Negative for suicidal ideas. The patient is nervous/anxious.     Past Medical History:  Diagnosis Date  . Anemia   . Anxiety   . Depression   . Hyperlipidemia   . Hypertension   . Migraines   . Osteoarthritis   . Sleep apnea   . Stroke [facility])     Past Surgical History:  Procedure Laterality Date  . CESAREAN SECTION    . GANGLION CYST EXCISION    . TEE WITHOUT CARDIOVERSION N/A 10/09/2014   Procedure: TRANSESOPHAGEAL ECHOCARDIOGRAM (TEE);  Surgeon: Sanda Klein, MD;  Location: [facility] ENDOSCOPY;  Service: Cardiovascular;  Laterality: N/A;  . TONSILLECTOMY      Family History  Problem Relation Age of Onset  . Cancer Mother        type unknown  . Hypertension Mother   . Hypertension Sister   . Diabetes Sister   . Cancer Maternal Aunt        4 aunts died of cancer types unknown    Social History Reviewed with no changes to be made today.   Outpatient Medications Prior to Visit   Medication Sig Dispense Refill  . atorvastatin (LIPITOR) 80 MG tablet Take 1 tablet (80 mg total) by mouth daily at 6 PM. 90 tablet 3  . clopidogrel (PLAVIX) 75 MG tablet Take 1 tablet (75 mg total) by mouth daily. 90 tablet 3  . gabapentin (NEURONTIN) 300 MG capsule Take 1 capsule (300 mg total) by mouth 2 (two) times daily. 60 capsule 2  . Galcanezumab-gnlm (EMGALITY) 120 MG/ML SOSY Inject 120 mg into the skin every 30 (thirty) days. 1 Syringe 11  . hydrALAZINE (APRESOLINE) 50 MG tablet TAKE 1 TABLET BY MOUTH 3 TIMES DAILY WITH MEALS. 90 tablet 5  . metoCLOPramide (REGLAN) 10 MG tablet Take 1 tablet (10 mg total) by mouth every 6 (six) hours as needed for nausea (and headache). 30 tablet 2  . pantoprazole (PROTONIX) 20 MG tablet Take 1 tablet (20 mg total) by mouth daily. 90 tablet 1  . topiramate (TOPAMAX) 50 MG tablet Take 1 tablet (50 mg total) by mouth daily. 90 tablet 1  . traZODone (DESYREL) 100 MG tablet Take 1 tablet (100 mg total) by mouth at bedtime. 90 tablet 1  . venlafaxine XR (EFFEXOR XR) 75 MG 24 hr capsule Take 3 capsules (225 mg total) by mouth daily with breakfast. 90 capsule 3  . amLODipine (NORVASC) 10 MG tablet Take 1 tablet (10 mg total) by mouth daily. 90 tablet 1  . lisinopril (PRINIVIL,ZESTRIL) 40 MG tablet Take 1 tablet (40 mg total) by mouth daily. 90 tablet 1  . metoprolol tartrate (LOPRESSOR) 50 MG tablet Take 1 tablet (50 mg total) by mouth 2 (two) times daily. 60 tablet 5  . spironolactone (ALDACTONE) 25 MG tablet Take 1 tablet (25 mg total) by mouth daily. 90 tablet 1   No facility-administered medications prior to visit.     Allergies  Allergen Reactions  . Ibuprofen     Can not take with current medications  . Tylenol [Acetaminophen] Other (See Comments)    Pt stated tylenol gives her extreme headache       Objective:    BP 108/69 (BP Location: Right Arm, Patient Position: Sitting, Cuff Size: Normal)   Pulse (!) 55   Temp 98.6 F (37 C) (Oral)    Ht 5\' 6"  (1.676 m)   Wt 190 lb 3.2 oz (86.3 kg)   SpO2 100%   BMI 30.70 kg/m  Wt Readings from Last 3 Encounters:  07/14/18 190 lb 3.2 oz (86.3 kg)  06/29/18 189 lb 1.9 oz (85.8 kg)  05/21/18 191 lb 12.8 oz (87 kg)    Physical Exam Vitals signs and nursing note reviewed.  Constitutional:      Appearance: She is well-developed.  HENT:     Head: Normocephalic and atraumatic.  Neck:     Musculoskeletal: Normal range of motion.  Cardiovascular:     Rate and Rhythm: Regular rhythm. Bradycardia present.     Heart sounds: Normal heart sounds. No murmur. No friction rub.  No gallop.   Pulmonary:     Effort: Pulmonary effort is normal. No tachypnea or respiratory distress.     Breath sounds: Normal breath sounds. No decreased breath sounds, wheezing, rhonchi or rales.  Chest:     Chest wall: No tenderness.  Abdominal:     General: Bowel sounds are normal.     Palpations: Abdomen is soft.  Musculoskeletal: Normal range of motion.     Right lower leg: No edema.     Left lower leg: No edema.  Skin:    General: Skin is warm and dry.  Neurological:     Mental Status: She is alert and oriented to person, place, and time.  Psychiatric:        Behavior: Behavior normal. Behavior is cooperative.        Thought Content: Thought content normal.        Judgment: Judgment normal.        Patient has been counseled extensively about nutrition and exercise as well as the importance of adherence with medications and regular follow-up. The patient was given clear instructions to go to ER or return to medical center if symptoms don't improve, worsen or new problems develop. The patient verbalized understanding.   Follow-up: Return for PAP SMEAR.   [name], FNP-BC [facility] and [facility] [place], [place]   07/17/2018, 6:07 PM

## 2018-07-14 NOTE — Patient Instructions (Signed)

## 2018-07-17 ENCOUNTER — Encounter: Payer: Self-pay | Admitting: Nurse Practitioner

## 2018-07-17 MED ORDER — AMLODIPINE BESYLATE 10 MG PO TABS: 10.0000 mg | ORAL_TABLET | Freq: Every day | ORAL | 1 refills | Status: DC

## 2018-07-17 MED ORDER — LISINOPRIL 40 MG PO TABS: 40.0000 mg | ORAL_TABLET | Freq: Every day | ORAL | 1 refills | Status: DC

## 2018-07-17 MED ORDER — SPIRONOLACTONE 25 MG PO TABS: 25.0000 mg | ORAL_TABLET | Freq: Every day | ORAL | 1 refills | Status: DC

## 2018-07-17 MED ORDER — METOPROLOL TARTRATE 50 MG PO TABS: 50.0000 mg | ORAL_TABLET | Freq: Two times a day (BID) | ORAL | 5 refills | Status: DC

## 2018-07-26 ENCOUNTER — Telehealth: Payer: Self-pay | Admitting: Nurse Practitioner

## 2018-07-26 NOTE — Telephone Encounter (Signed)
Patient called to check on the supplies request from [facility]. Patient states it was faxed over. Please follow up.

## 2018-07-27 NOTE — Telephone Encounter (Signed)
Called the patient back and gave her the fax number so that we can get her supplies.

## 2018-08-25 ENCOUNTER — Other Ambulatory Visit: Payer: Medicare Other | Admitting: Nurse Practitioner

## 2018-08-31 ENCOUNTER — Ambulatory Visit: Payer: Medicare Other | Admitting: Neurology

## 2018-09-20 ENCOUNTER — Telehealth: Payer: Self-pay | Admitting: Neurology

## 2018-09-20 ENCOUNTER — Other Ambulatory Visit: Payer: Self-pay | Admitting: Nurse Practitioner

## 2018-09-20 DIAGNOSIS — G629 Polyneuropathy, unspecified: Secondary | ICD-10-CM

## 2018-09-20 NOTE — Telephone Encounter (Signed)
Patient called regarding having had a really bad headache for 3 days. She said she did not want to go to the ER due to the Virus going around. She also mentioned needing her Shot Rx faxed into [facility] in [place]. Please Call. Thanks

## 2018-09-20 NOTE — Telephone Encounter (Signed)
Patient is having a really bad headache needs her shot   For the headache, she has not had her shot this month please call patient

## 2018-09-21 DIAGNOSIS — R42 Dizziness and giddiness: Secondary | ICD-10-CM | POA: Diagnosis not present

## 2018-09-21 DIAGNOSIS — I639 Cerebral infarction, unspecified: Secondary | ICD-10-CM | POA: Diagnosis not present

## 2018-09-21 DIAGNOSIS — Z3202 Encounter for pregnancy test, result negative: Secondary | ICD-10-CM | POA: Diagnosis not present

## 2018-09-21 DIAGNOSIS — I959 Hypotension, unspecified: Secondary | ICD-10-CM | POA: Diagnosis not present

## 2018-09-21 DIAGNOSIS — I491 Atrial premature depolarization: Secondary | ICD-10-CM | POA: Diagnosis not present

## 2018-09-21 DIAGNOSIS — R262 Difficulty in walking, not elsewhere classified: Secondary | ICD-10-CM | POA: Diagnosis not present

## 2018-09-21 DIAGNOSIS — G238 Other specified degenerative diseases of basal ganglia: Secondary | ICD-10-CM | POA: Diagnosis not present

## 2018-09-21 DIAGNOSIS — R51 Headache: Secondary | ICD-10-CM | POA: Diagnosis not present

## 2018-09-21 NOTE — Telephone Encounter (Signed)
Patient is needing her shot sent in. Thanks!

## 2018-09-22 MED ORDER — GALCANEZUMAB-GNLM 120 MG/ML ~~LOC~~ SOAJ: 120.0000 mg | SUBCUTANEOUS | 11 refills | Status: DC

## 2018-09-22 NOTE — Telephone Encounter (Signed)
Pt called about the Emgality needing a PA and if we rcvd a fax from when I spoke with her earlier. I advised her when it is rcvd we will send it to [place] [facility] and process and I will contact her.

## 2018-09-22 NOTE — Telephone Encounter (Signed)
Spoke with Pt. She rcvd cocktail in ED and feels much better. Sent in Emgality to [facility]. She was unaware the drug stores were open during the virus outbreak and didn't know to call before now.

## 2018-09-22 NOTE — Telephone Encounter (Signed)
Patient called the after hours last night(09-21-18) @ 9:00.   Patient states that she has had a  Really bad headache since Thursday. She takes her medication 3x daily. Hx of migraines. She states that she has been falling and a lot and is confused.   Patient symptoms are worsening  Patient states she has chronic migraine    affirmed question    Difficult to awaken or acting confused (e.g, disoriented,slurred speech )   911 was called at 9:03pm    Please call patient

## 2018-09-23 NOTE — Progress Notes (Addendum)
Rcvd call from [place] at [facility]. She wanted to know if there had been a reduction in Pt's headaches since starting Emgality. I advised her yes, by half. That was from a conversdation with the Pt yesterday about the PA.

## 2018-09-23 NOTE — Progress Notes (Signed)
Initiated on Cover My meds Was system generated faxed to [facility] [phone]

## 2018-09-24 NOTE — Progress Notes (Signed)
Brandy Roberts (Key: [id number])  Rx #: [id number]  Emgality 120MG /ML auto-injectors (migraine)  Form [facility] Medicare Coverage Determination Request Form  Plan Contact [phone] phone  [phone] fax  Created  2 days ago  Sent to Plan  22 hours ago  Determination  Favorable  25 minutes ago

## 2018-09-27 ENCOUNTER — Telehealth: Payer: Self-pay | Admitting: Nurse Practitioner

## 2018-09-27 ENCOUNTER — Telehealth: Payer: Self-pay | Admitting: Neurology

## 2018-09-27 NOTE — Telephone Encounter (Signed)
Advised to go to the ED or UC

## 2018-09-27 NOTE — Telephone Encounter (Signed)
Noted.  CMA also called patient to inform to go to Emergency department and also contact her Neurology to inform of the passing out.

## 2018-09-27 NOTE — Telephone Encounter (Signed)
Patient calling in that she passed out thurs for about 5 mins and then Saturday for 15/20 mins. Her PCP is wanting her to be seen by Dr. Tomi Likens. Please let us know if this needs to be an In-office visit or a VV since she has had many issues. Thanks!

## 2018-09-27 NOTE — Telephone Encounter (Signed)
Agree with recommendations.  

## 2018-09-27 NOTE — Telephone Encounter (Signed)
Patient called stating she passed out in her house while alone. Patient states she is unsure why she has these passing out Please follow up

## 2018-09-27 NOTE — Telephone Encounter (Signed)
CMA spoke to patient to contact her Neurology to also inform about the passing out episode.  Patient was also inform to go to emergency department to be evaluated. Pt. Understood.

## 2018-09-28 NOTE — Telephone Encounter (Signed)
Called and spoke with Pt. Thursday was the day she had a severe headache and was transported to the ED and rcvd a headache cocktail. Pt states she was being lead to the ambulance from her bedroom by the EMT's when she passed out. Lasted for 20-30 seconds. On Saturday, she again had a headache, stood up, walked about 3 steps, felt shaky and passed out. Pt states she was alone with her 3 and 49 yr old grandchildren. She states she was unconscious for 15-20 minutes. On both days, she had taken all of her regular medication, and flurbiprofen for the headaches.   Per Dr. Tomi Likens, will need a virtual visit.  Called and advised Pt, she scheduled visit.

## 2018-09-28 NOTE — Progress Notes (Signed)
Virtual Visit via Video Note The purpose of this virtual visit is to provide medical care while limiting exposure to the novel coronavirus.    Consent was obtained for video visit:  Yes.   Answered questions that patient had about telehealth interaction:  Yes.   I discussed the limitations, risks, security and privacy concerns of performing an evaluation and management service by telemedicine. I also discussed with the patient that there may be a patient responsible charge related to this service. The patient expressed understanding and agreed to proceed.  Pt location: Home Physician Location: office Name of referring provider:  [name], NP I connected with Brandy Roberts at patients initiation/request on 09/29/2018 at 10:30 AM EDT by video enabled telemedicine application and verified that I am speaking with the correct person using two identifiers. Pt MRN:  [id number] Pt DOB:  [birth year] Video Participants:  Brandy Roberts   History of Present Illness:  Brandy Roberts is a 48 year old right-handed woman with aortic insufficiency, hypertension, hyperlipidemia and history of stroke who follows up for worsening headaches with syncope.  History supplemented by ED notes.  UPDATE: She started [employer].  Venlafaxine was increased to 225mg  to also help decrease migraines and to address depression as well.  Migraines overall have improved in frequency. Intensity:  severe Duration:  2-3 hours Frequency:  2 days a month.  On 09/21/18, she presented to the ED at [facility] for a 5 day intractable holocephalic throbbing headache.  She reports that when EMS was leading her to the ambulance from her bedroom, she passed out for 20-30 seconds.  She described sensation of lightheadedness, like she was going to pass out, as well as tunnel vision and palpitations.  She was afebrile.  CT of head revealed her old left basal ganglia infarct but no acute abnormality.  She was treated with  Reglan and Benadryl and headache reportedly resolved.  On 09/26/18, she was sitting on her barstool when she felt lightheaded again.  She noted palpitations as well.  She got up and started walking to her bedroom when she noted tunnel vision and passed out.  She woke up on the floor and bruised her back and leg.  She thinks she may have been unconsciousness for 20 to 30 minute.  There were no witnesses.  She noted urinary incontinence but did not bite her tongue.  She did not have a headache.  She denies change in medications over the past week.  She denies fever or illness.    Current NSAIDS: Flurbiprofen 100mg  Current analgesics: none Current triptans: None Current ergotamine: None Current anti-emetic: Zofran 4 mg, Reglan 10 mg Current muscle relaxants: None Current anti-anxiolytic: None Current sleep aide: trazodone Current Antihypertensive medications: Amlodipine 10 mg, hydralazine, Spironolactone, lisinopril, Lopressor Current Antidepressant medications: Venlafaxine XR 225 mg Current Anticonvulsant medications: Topiramate 50 mg at bedtime, gabapentin 300 mg twice daily Current anti-CGRP: None Current Vitamins/Herbal/Supplements: None Current Antihistamines/Decongestants: None Other therapy: None Hormone/birth control: None  Caffeine: Coffee, sweet tea, not daily Alcohol: No Smoker: Cigarettes Diet: Hydrates Exercise: No Depression: Yes; Anxiety: Yes Other pain: Chronic right knee pain Sleep hygiene: Sometimes tosses and turns  HISTORY:  Onset: Migraines since she was young, but worse since her stroke in May 2016. Location: holocephalic Quality: pounding Initial Intensity: 10/10 Aura: no Prodrome: no Postdrome: no Associated symptoms: Photophobia, phonophobia. Rarely nausea. She has not had any new worse headache of her life, waking up from sleep Initial Duration: 2 days (but Fioricet and ibuprofen  lowers intensity down to 2-3/10) Initial Frequency: Once or twice  a month Frequency of abortive medication: only as needed Triggers: None Relieving factors: Laying down in dark room.  Butalbital, ibuprofen. Tramadol helped but she was told not to take it. Activity: aggravates  Past NSAIDS: Ibuprofen, naproxen Past analgesics: Tylenol, Excedrin, Tramadol (effective but GI upset) Past abortive triptans: no Past muscle relaxants: no Past anti-emetic: no Past antihypertensive medications: no Past antidepressant medications: no Past anticonvulsant medications: no Past vitamins/Herbal/Supplements: no Other past therapies: no  Family history of headache: Sister.  STROKE: She had a stroke in May 2016 with left sided weakness. MRI of brain from 10/06/14 was personally reviewed and revealed small acute lacunar infarcts in the right thalamus and right corona radiata, as well as chronic lacunar infarcts in the left hemisphere. CTA of head and neck revealed diffuse bilateral petrous and cavernous carotid stenosis. Cardiac source of embolus was not discovered.   She was admitted to [facility] from 10/24/16 to 10/26/16 for stroke-like event. She suddenly developed left sided weakness and slurred speech with associated headache. Blood pressure in ED was 120/66. CT of head was personally reviewed and negative for acute abnormality. She had refused tPA. MRI of brain was personally reviewed and revealed no acute stroke or bleed. MRA of head revealed no significant intracranial stenosis or occlusion. LDL was 84. Hgb A1c was 5.7. As per neurology evaluation, her deficits appeared to be non-organic, with unusual speech pattern and giveaway weakness. Somatization was suspected. 2D echo from 11/06/16 demonstrated normal LV EF of 65-70% with no cardiac source of emboli. Atorvastatin was increased from 10mg  to 80mg  daily. Differential includes TIA vs hemiplegic migraine vs somatization.  In May 2019, she endorsed increased problems with balance  and falls.  She stated that they were associated with headache and worsening of her left-sided weakness.  MRI of the brain without contrast from 11/13/2017 was personally reviewed and demonstrated no new intracranial abnormalities.  Past Medical History: Past Medical History:  Diagnosis Date   Anemia    Anxiety    Depression    Hyperlipidemia    Hypertension    Migraines    Osteoarthritis    Sleep apnea    Stroke [facility])     Medications: Outpatient Encounter Medications as of 09/29/2018  Medication Sig   amLODipine (NORVASC) 10 MG tablet Take 1 tablet (10 mg total) by mouth daily.   atorvastatin (LIPITOR) 80 MG tablet Take 1 tablet (80 mg total) by mouth daily at 6 PM.   clopidogrel (PLAVIX) 75 MG tablet Take 1 tablet (75 mg total) by mouth daily.   gabapentin (NEURONTIN) 300 MG capsule TAKE 1 CAPSULE BY MOUTH TWICE DAILY   Galcanezumab-gnlm (EMGALITY) 120 MG/ML SOAJ Inject 120 mg into the skin every 30 (thirty) days.   Galcanezumab-gnlm (EMGALITY) 120 MG/ML SOSY Inject 120 mg into the skin every 30 (thirty) days.   hydrALAZINE (APRESOLINE) 50 MG tablet TAKE 1 TABLET BY MOUTH 3 TIMES DAILY WITH MEALS.   lisinopril (PRINIVIL,ZESTRIL) 40 MG tablet Take 1 tablet (40 mg total) by mouth daily.   metoCLOPramide (REGLAN) 10 MG tablet Take 1 tablet (10 mg total) by mouth every 6 (six) hours as needed for nausea (and headache).   metoprolol tartrate (LOPRESSOR) 50 MG tablet Take 1 tablet (50 mg total) by mouth 2 (two) times daily.   pantoprazole (PROTONIX) 20 MG tablet Take 1 tablet (20 mg total) by mouth daily.   spironolactone (ALDACTONE) 25 MG tablet Take 1 tablet (  25 mg total) by mouth daily.   topiramate (TOPAMAX) 50 MG tablet Take 1 tablet (50 mg total) by mouth daily.   traZODone (DESYREL) 100 MG tablet Take 1 tablet (100 mg total) by mouth at bedtime.   venlafaxine XR (EFFEXOR XR) 75 MG 24 hr capsule Take 3 capsules (225 mg total) by mouth daily with breakfast.    No facility-administered encounter medications on file as of 09/29/2018.     Allergies: Allergies  Allergen Reactions   Ibuprofen     Can not take with current medications   Tylenol [Acetaminophen] Other (See Comments)    Pt stated tylenol gives her extreme headache    Family History: Family History  Problem Relation Age of Onset   Cancer Mother        type unknown   Hypertension Mother    Hypertension Sister    Diabetes Sister    Cancer Maternal Aunt        4 aunts died of cancer types unknown    Social History: Social History   Socioeconomic History   Marital status: Single    Spouse name: Not on file   Number of children: 2   Years of education: 12   Highest education level: Not on file  Occupational History   Occupation: N/A  Social Designer, fashion/clothing strain: Not on file   Food insecurity:    Worry: Not on file    Inability: Not on file   Transportation needs:    Medical: Not on file    Non-medical: Not on file  Tobacco Use   Smoking status: Former Smoker    Packs/day: 0.25    Last attempt to quit: 08/31/2017    Years since quitting: 1.0   Smokeless tobacco: Never Used   Tobacco comment: Pt. still smoke marijuana.   Substance and Sexual Activity   Alcohol use: No    Alcohol/week: 0.0 standard drinks   Drug use: Yes    Types: Marijuana   Sexual activity: Yes    Birth control/protection: Post-menopausal  Lifestyle   Physical activity:    Days per week: Not on file    Minutes per session: Not on file   Stress: Not on file  Relationships   Social connections:    Talks on phone: Not on file    Gets together: Not on file    Attends religious service: Not on file    Active member of club or organization: Not on file    Attends meetings of clubs or organizations: Not on file    Relationship status: Not on file   Intimate partner violence:    Fear of current or ex partner: Not on file    Emotionally abused: Not on  file    Physically abused: Not on file    Forced sexual activity: Not on file  Other Topics Concern   Not on file  Social History Narrative   Patient lives with her uncle in a one story home.  Has 2 sons.  Currently on disability.  Education: high school.   Drinks 1-2 sodas a week     Observations/Objective:   There were no vitals taken for this visit. Alert and oriented.  Speech fluent but slow.  No dysarthric.  Language intact.  Face symmetric.  Assessment and Plan:   1.  Recurrent loss of consciousness/syncope.  Semiology characteristic of syncope.  May be due to orthostatic hypotension, vasovagal or cardiac source.  The first event occurred in setting  of severe migraine, which may have been vasovagal.  However, she states that she was unconsciousness for 20-30 minutes after the second event, which is not usual for syncope.  History may not be accurate.  She did not bite her tongue but she endorsed urinary incontinence.   2.  Migraine without aura, without status migrainosus, not intractable, overall improved. 3.  Hemiplegia of left side as late effect of stroke 4.  HTN 5.  Tobacco use disorder  1.  We will check MRI of brain with and without contrast to further evaluate for recurrent loss of consciousness.  We will also check EEG when testing resumes. 2.  I think PCP should consider checking orthostatics or evaluate for cardiac source as well. 3.  Continue Emgality and venlafaxine 4.  Follow up in June as scheduled.  Follow Up Instructions:    -I discussed the assessment and treatment plan with the patient. The patient was provided an opportunity to ask questions and all were answered. The patient agreed with the plan and demonstrated an understanding of the instructions.   The patient was advised to call back or seek an in-person evaluation if the symptoms worsen or if the condition fails to improve as anticipated.   Dudley Major, DO

## 2018-09-28 NOTE — Telephone Encounter (Signed)
Sandi, would be please contact Ms Dallaire so we can get more information on what is going on.

## 2018-09-29 ENCOUNTER — Telehealth (INDEPENDENT_AMBULATORY_CARE_PROVIDER_SITE_OTHER): Payer: Medicare Other | Admitting: Neurology

## 2018-09-29 ENCOUNTER — Other Ambulatory Visit: Payer: Self-pay

## 2018-09-29 DIAGNOSIS — I10 Essential (primary) hypertension: Secondary | ICD-10-CM

## 2018-09-29 DIAGNOSIS — R4020 Unspecified coma: Secondary | ICD-10-CM | POA: Diagnosis not present

## 2018-09-29 DIAGNOSIS — I69959 Hemiplegia and hemiparesis following unspecified cerebrovascular disease affecting unspecified side: Secondary | ICD-10-CM

## 2018-09-29 DIAGNOSIS — E785 Hyperlipidemia, unspecified: Secondary | ICD-10-CM

## 2018-09-29 DIAGNOSIS — G43009 Migraine without aura, not intractable, without status migrainosus: Secondary | ICD-10-CM

## 2018-09-29 DIAGNOSIS — F17210 Nicotine dependence, cigarettes, uncomplicated: Secondary | ICD-10-CM

## 2018-09-29 NOTE — Addendum Note (Signed)
Addended by: Clois Comber on: 09/29/2018 02:29 PM   Modules accepted: Orders

## 2018-09-30 ENCOUNTER — Other Ambulatory Visit: Payer: Self-pay

## 2018-09-30 ENCOUNTER — Ambulatory Visit: Payer: Medicare Other | Attending: Family Medicine | Admitting: Family Medicine

## 2018-09-30 ENCOUNTER — Encounter: Payer: Self-pay | Admitting: Family Medicine

## 2018-09-30 VITALS — BP 97/63 | HR 55 | Temp 97.9°F | Ht 66.0 in | Wt 190.8 lb

## 2018-09-30 DIAGNOSIS — E785 Hyperlipidemia, unspecified: Secondary | ICD-10-CM | POA: Insufficient documentation

## 2018-09-30 DIAGNOSIS — I959 Hypotension, unspecified: Secondary | ICD-10-CM | POA: Diagnosis not present

## 2018-09-30 DIAGNOSIS — F329 Major depressive disorder, single episode, unspecified: Secondary | ICD-10-CM | POA: Diagnosis not present

## 2018-09-30 DIAGNOSIS — I10 Essential (primary) hypertension: Secondary | ICD-10-CM

## 2018-09-30 DIAGNOSIS — Z7902 Long term (current) use of antithrombotics/antiplatelets: Secondary | ICD-10-CM | POA: Insufficient documentation

## 2018-09-30 DIAGNOSIS — I440 Atrioventricular block, first degree: Secondary | ICD-10-CM | POA: Diagnosis not present

## 2018-09-30 DIAGNOSIS — Z8249 Family history of ischemic heart disease and other diseases of the circulatory system: Secondary | ICD-10-CM | POA: Diagnosis not present

## 2018-09-30 DIAGNOSIS — G43909 Migraine, unspecified, not intractable, without status migrainosus: Secondary | ICD-10-CM | POA: Diagnosis not present

## 2018-09-30 DIAGNOSIS — M1990 Unspecified osteoarthritis, unspecified site: Secondary | ICD-10-CM | POA: Diagnosis not present

## 2018-09-30 DIAGNOSIS — Z79899 Other long term (current) drug therapy: Secondary | ICD-10-CM | POA: Diagnosis not present

## 2018-09-30 DIAGNOSIS — Z7901 Long term (current) use of anticoagulants: Secondary | ICD-10-CM | POA: Insufficient documentation

## 2018-09-30 DIAGNOSIS — M25511 Pain in right shoulder: Secondary | ICD-10-CM | POA: Insufficient documentation

## 2018-09-30 DIAGNOSIS — Z87891 Personal history of nicotine dependence: Secondary | ICD-10-CM | POA: Insufficient documentation

## 2018-09-30 DIAGNOSIS — R9431 Abnormal electrocardiogram [ECG] [EKG]: Secondary | ICD-10-CM

## 2018-09-30 DIAGNOSIS — R55 Syncope and collapse: Secondary | ICD-10-CM

## 2018-09-30 DIAGNOSIS — R001 Bradycardia, unspecified: Secondary | ICD-10-CM | POA: Diagnosis not present

## 2018-09-30 DIAGNOSIS — W1830XA Fall on same level, unspecified, initial encounter: Secondary | ICD-10-CM | POA: Diagnosis not present

## 2018-09-30 MED ORDER — METOPROLOL TARTRATE 25 MG PO TABS: 25.0000 mg | ORAL_TABLET | Freq: Two times a day (BID) | ORAL | 2 refills | Status: DC

## 2018-09-30 NOTE — Progress Notes (Signed)
Established Patient Office Visit  Subjective:  Patient ID: Brandy Roberts, female    DOB: [birth year]  Age: 48 y.o. MRN: [id number]  CC:  Chief Complaint  Patient presents with  . Fall  . Hospitalization Follow-up    HPI Brandy Roberts presents for follow-up of  ED visit on 09/21/18 and recent episodes of passing out. Patient went to the ED on 09/21/18 due to complaint of headaches which have occurred since her stroke 4 years ago. Patient had head CT done which showed no acute issues but evidence of an old left basal ganglia infarct. She had also run out of her preventative medication for migraines. She states that when she was picked up by EMS she briefly passed out while EMS was walking with her in the hallway of her home. She reports that this past Saturday when she was at home she had onset of acute dizziness/feeling light-headed and she recalls stumbling back against the wall in her hallway and falling to the floor. When she came to she was on the floor and had some upper back/shoulder pain on the right side and the next day she noticed a large painful bruise on her right upper back. She denies any chest pain or sensation of an abnormal heart rhythm. She denies breaking out into a sweat or nausea with her episodes of passing out.      She reports that she had a stroke in the past due to high blood pressure and she is on a lot of medications for her blood pressure and she has migraine headaches followed by Dr. Tomi Likens in Neurology. She denies any headaches of episodes of focal numbness or weakness preceding her episodes of passing out. She contacted her neurologist about her episodes of passing out and had a tele-visit with her neurologist yesterday. She states that she will be scheduled for imaging of her brain.   Past Medical History:  Diagnosis Date  . Anemia   . Anxiety   . Depression   . Hyperlipidemia   . Hypertension   . Migraines   . Osteoarthritis   . Sleep apnea   . Stroke  [facility])     Past Surgical History:  Procedure Laterality Date  . CESAREAN SECTION    . GANGLION CYST EXCISION    . TEE WITHOUT CARDIOVERSION N/A 10/09/2014   Procedure: TRANSESOPHAGEAL ECHOCARDIOGRAM (TEE);  Surgeon: Sanda Klein, MD;  Location: [facility] ENDOSCOPY;  Service: Cardiovascular;  Laterality: N/A;  . TONSILLECTOMY      Family History  Problem Relation Age of Onset  . Cancer Mother        type unknown  . Hypertension Mother   . Hypertension Sister   . Diabetes Sister   . Cancer Maternal Aunt        4 aunts died of cancer types unknown    Social History   Tobacco Use  . Smoking status: Former Smoker    Packs/day: 0.25    Last attempt to quit: 08/31/2017    Years since quitting: 1.0  . Smokeless tobacco: Never Used  . Tobacco comment: Pt. still smoke marijuana.   Substance Use Topics  . Alcohol use: No    Alcohol/week: 0.0 standard drinks  . Drug use: Yes    Types: Marijuana    Outpatient Medications Prior to Visit  Medication Sig Dispense Refill  . amLODipine (NORVASC) 10 MG tablet Take 1 tablet (10 mg total) by mouth daily. 90 tablet 1  . atorvastatin (LIPITOR) 80 MG tablet Take  1 tablet (80 mg total) by mouth daily at 6 PM. 90 tablet 3  . clopidogrel (PLAVIX) 75 MG tablet Take 1 tablet (75 mg total) by mouth daily. 90 tablet 3  . gabapentin (NEURONTIN) 300 MG capsule TAKE 1 CAPSULE BY MOUTH TWICE DAILY 60 capsule 2  . Galcanezumab-gnlm (EMGALITY) 120 MG/ML SOAJ Inject 120 mg into the skin every 30 (thirty) days. 1 pen 11  . Galcanezumab-gnlm (EMGALITY) 120 MG/ML SOSY Inject 120 mg into the skin every 30 (thirty) days. 1 Syringe 11  . hydrALAZINE (APRESOLINE) 50 MG tablet TAKE 1 TABLET BY MOUTH 3 TIMES DAILY WITH MEALS. 90 tablet 5  . lisinopril (PRINIVIL,ZESTRIL) 40 MG tablet Take 1 tablet (40 mg total) by mouth daily. 90 tablet 1  . metoCLOPramide (REGLAN) 10 MG tablet Take 1 tablet (10 mg total) by mouth every 6 (six) hours as needed for nausea (and headache). 30  tablet 2  . metoprolol tartrate (LOPRESSOR) 50 MG tablet Take 1 tablet (50 mg total) by mouth 2 (two) times daily. 60 tablet 5  . pantoprazole (PROTONIX) 20 MG tablet Take 1 tablet (20 mg total) by mouth daily. 90 tablet 1  . spironolactone (ALDACTONE) 25 MG tablet Take 1 tablet (25 mg total) by mouth daily. 90 tablet 1  . topiramate (TOPAMAX) 50 MG tablet Take 1 tablet (50 mg total) by mouth daily. 90 tablet 1  . traZODone (DESYREL) 100 MG tablet Take 1 tablet (100 mg total) by mouth at bedtime. 90 tablet 1  . venlafaxine XR (EFFEXOR XR) 75 MG 24 hr capsule Take 3 capsules (225 mg total) by mouth daily with breakfast. 90 capsule 3   No facility-administered medications prior to visit.     Allergies  Allergen Reactions  . Ibuprofen     Can not take with current medications  . Tylenol [Acetaminophen] Other (See Comments)    Pt stated tylenol gives her extreme headache    ROS Review of Systems  Constitutional: Positive for fatigue. Negative for chills and fever.  HENT: Negative for congestion and sore throat.   Respiratory: Negative for cough and shortness of breath.   Cardiovascular: Negative for chest pain, palpitations and leg swelling.  Gastrointestinal: Negative for abdominal pain, blood in stool, constipation, diarrhea and nausea.  Endocrine: Positive for cold intolerance (feels as if she stays cold). Negative for heat intolerance, polydipsia, polyphagia and polyuria.  Genitourinary: Negative for dysuria and frequency.  Musculoskeletal: Positive for arthralgias, back pain and gait problem (uses a wheeled walker due to her left sided weakness from CVA).  Neurological: Positive for syncope and headaches. Negative for dizziness.  Hematological: Negative for adenopathy. Does not bruise/bleed easily.      Objective:    Physical Exam  Constitutional: She is oriented to person, place, and time. She appears well-developed and well-nourished.  Eyes: Conjunctivae and EOM are normal.    Neck: Normal range of motion. Neck supple. No JVD present.  Cardiovascular: Regular rhythm.  Murmur heard. Slow heart rate on exam and a very soft murmur  Pulmonary/Chest: Effort normal and breath sounds normal. She has no wheezes.  Abdominal: Soft. There is no abdominal tenderness. There is no rebound and no guarding.  Musculoskeletal:        General: Tenderness (mild midline lumbosacral tenderness and tender over right lateral upper back in area of bruising) present. No edema.     Comments: Left sided weakness in the upper and lower extremity but patient did not give good effort  Lymphadenopathy:  She has no cervical adenopathy.  Neurological: She is alert and oriented to person, place, and time. No cranial nerve deficit. She exhibits abnormal muscle tone (increased tone in left upper arm with weakness and decreased ROM).  Skin: Skin is warm and dry.  Large bruise on the right upper lateral back  Psychiatric: She has a normal mood and affect. Her behavior is normal.  Nursing note and vitals reviewed.   BP 97/63 (BP Location: Right Arm, Patient Position: Sitting, Cuff Size: Large)   Pulse (!) 55   Temp 97.9 F (36.6 C) (Oral)   Ht 5\' 6"  (1.676 m)   Wt 190 lb 12.8 oz (86.5 kg)   SpO2 100%   BMI 30.80 kg/m  Wt Readings from Last 3 Encounters:  09/30/18 190 lb 12.8 oz (86.5 kg)  09/29/18 190 lb (86.2 kg)  07/14/18 190 lb 3.2 oz (86.3 kg)     Health Maintenance Due  Topic Date Due  . TETANUS/TDAP  08/21/1988  . PAP SMEAR-Modifier  06/13/2018      Lab Results  Component Value Date   TSH 0.688 02/21/2015   Lab Results  Component Value Date   WBC 8.2 11/11/2017   HGB 12.8 11/11/2017   HCT 39.1 11/11/2017   MCV 85.6 11/11/2017   PLT 252 11/11/2017   Lab Results  Component Value Date   NA 141 11/11/2017   K 3.7 11/11/2017   CO2 23 11/11/2017   GLUCOSE 106 (H) 11/11/2017   BUN 16 11/11/2017   CREATININE 0.98 11/11/2017   BILITOT 0.1 (L) 10/26/2016   ALKPHOS  60 10/26/2016   AST 15 10/26/2016   ALT 16 10/26/2016   PROT 6.4 (L) 10/26/2016   ALBUMIN 3.3 (L) 10/26/2016   CALCIUM 9.2 11/11/2017   ANIONGAP 8 11/11/2017   Lab Results  Component Value Date   CHOL 111 12/15/2017   Lab Results  Component Value Date   HDL 42 12/15/2017   Lab Results  Component Value Date   LDLCALC 57 12/15/2017   Lab Results  Component Value Date   TRIG 61 12/15/2017   Lab Results  Component Value Date   CHOLHDL 2.6 12/15/2017   Lab Results  Component Value Date   HGBA1C 5.8 (A) 07/14/2018      Assessment & Plan:  1. Syncope, unspecified syncope type Patient reports recent syncopal episodes x 2. Will obtain EKG as she also has a low blood pressure and slow heart rate on exam. I suspect that hypotension may be playing a role but also my have an arrhythmia. Will also check BMP for electrolyte abnormality and CBC as patient with past anemia on review of labs.  - EKG 86-VEHM - Basic Metabolic Panel - CBC with Differential - Ambulatory referral to Cardiology  2. Bradycardia Slow heart rate on exam and EKG with HR of 50. Will refer to cardiology ASAP due to syncopal episodes, decrease dose of lopressor and check for electrolyte abnormality, thyroid disorder or blood disorder that may be contributing - Basic Metabolic Panel - TSH - CBC with Differential - Ambulatory referral to Cardiology  3. Hypotension, unspecified hypotension type Patient reports that she remains well hydrated so her low blood pressure may be related to her medications for treatment of hypotension. Dose lowered of her lopressor and cardiology referral placed - Basic Metabolic Panel - TSH - CBC with Differential - Ambulatory referral to Cardiology  4. Essential hypertension Patient is on multiple blood pressure medications and is having issues with syncope and  at today's visit, patient with bradycardia and low heart rate. Will have patient lower her dose of lopressor from 50 mg  to 25 mg twice per day and she may require further changes in her blood pressure medication due to her hypotension. - metoprolol tartrate (LOPRESSOR) 25 MG tablet; Take 1 tablet (25 mg total) by mouth 2 (two) times daily.  Dispense: 60 tablet; Refill: 2  5./6. Abnormal EKG; first degree AV block EKG done secondary to patient's recent syncopal episodes and low heart rate as well as blood pressure and EKG abnormal with HR of 50 and findings suggestive of first degree AV block and she is being referred to cardiology for further evaluation and treatment and should seek medical attention for any future pre/near or syncopal episodes  An After Visit Summary was printed and given to the patient.  Allergies as of 09/30/2018      Reactions   Ibuprofen    Can not take with current medications   Tylenol [acetaminophen] Other (See Comments)   Pt stated tylenol gives her extreme headache      Medication List       Accurate as of September 30, 2018 11:59 PM. Always use your most recent med list.        amLODipine 10 MG tablet Commonly known as:  NORVASC Take 1 tablet (10 mg total) by mouth daily.   atorvastatin 80 MG tablet Commonly known as:  LIPITOR Take 1 tablet (80 mg total) by mouth daily at 6 PM.   clopidogrel 75 MG tablet Commonly known as:  PLAVIX Take 1 tablet (75 mg total) by mouth daily.   gabapentin 300 MG capsule Commonly known as:  NEURONTIN TAKE 1 CAPSULE BY MOUTH TWICE DAILY   Galcanezumab-gnlm 120 MG/ML Sosy Commonly known as:  Emgality Inject 120 mg into the skin every 30 (thirty) days.   Galcanezumab-gnlm 120 MG/ML Soaj Commonly known as:  Emgality Inject 120 mg into the skin every 30 (thirty) days.   hydrALAZINE 50 MG tablet Commonly known as:  APRESOLINE TAKE 1 TABLET BY MOUTH 3 TIMES DAILY WITH MEALS.   lisinopril 40 MG tablet Commonly known as:  ZESTRIL Take 1 tablet (40 mg total) by mouth daily.   metoCLOPramide 10 MG tablet Commonly known as:  REGLAN Take  1 tablet (10 mg total) by mouth every 6 (six) hours as needed for nausea (and headache).   metoprolol tartrate 25 MG tablet Commonly known as:  LOPRESSOR Take 1 tablet (25 mg total) by mouth 2 (two) times daily.   pantoprazole 20 MG tablet Commonly known as:  Protonix Take 1 tablet (20 mg total) by mouth daily.   spironolactone 25 MG tablet Commonly known as:  ALDACTONE Take 1 tablet (25 mg total) by mouth daily.   topiramate 50 MG tablet Commonly known as:  TOPAMAX Take 1 tablet (50 mg total) by mouth daily.   traZODone 100 MG tablet Commonly known as:  DESYREL Take 1 tablet (100 mg total) by mouth at bedtime.   venlafaxine XR 75 MG 24 hr capsule Commonly known as:  Effexor XR Take 3 capsules (225 mg total) by mouth daily with breakfast.      Return in about 2 weeks (around 10/14/2018) for Hypotension/bradycardia with PCP (Zelda).   Follow-up: No follow-ups on file.    Antony Blackbird, MD

## 2018-09-30 NOTE — Patient Instructions (Addendum)
Please lower your dose of your blood pressure medication Lopressor from your current 50 mg twice per day to 25 mg twice per day as your heart rate and blood pressure were low today which could be contributing to your episodes of passing out. A prescription was sent to your pharmacy for Lopressor 25 mg twice per day or you can break your 50 mg dose in half and take 1/2 of the 50 mg (25 mg) twice per day along with your other medications. You will also be contacted to schedule a follow-up visit with a heart doctor (cardiologist) for further evaluation of your low heart rate and episodes of passing out. Please follow-up in 2 weeks with your primary care provider, Brandy Rankins, NP.  Syncope Syncope is when you pass out (faint) for a short time. It is caused by a sudden decrease in blood flow to the brain. Signs that you may be about to pass out include:  Feeling dizzy or light-headed.  Feeling sick to your stomach (nauseous).  Seeing all white or all black.  Having cold, clammy skin. If you pass out, get help right away. Call your local emergency services (911 in the [place] Do not drive yourself to the hospital. Follow these instructions at home: Watch for any changes in your symptoms. Take these actions to stay safe and help with your symptoms: Lifestyle  Do not drive, use machinery, or play sports until your doctor says it is okay.  Do not drink alcohol.  Do not use any products that contain nicotine or tobacco, such as cigarettes and e-cigarettes. If you need help quitting, ask your doctor.  Drink enough fluid to keep your pee (urine) pale yellow. General instructions  Take over-the-counter and prescription medicines only as told by your doctor.  If you are taking blood pressure or heart medicine, sit up and stand up slowly. Spend a few minutes getting ready to sit and then stand. This can help you feel less dizzy.  Have someone stay with you until you feel stable.  If you start to  feel like you might pass out, lie down right away and raise (elevate) your feet above the level of your heart. Breathe deeply and steadily. Wait until all of the symptoms are gone.  Keep all follow-up visits as told by your doctor. This is important. Get help right away if:  You have a very bad headache.  You pass out once or more than once.  You have pain in your chest, belly, or back.  You have a very fast or uneven heartbeat (palpitations).  It hurts to breathe.  You are bleeding from your mouth or your bottom (rectum).  You have black or tarry poop (stool).  You have jerky movements that you cannot control (seizure).  You are confused.  You have trouble walking.  You are very weak.  You have vision problems. These symptoms may be an emergency. Do not wait to see if the symptoms will go away. Get medical help right away. Call your local emergency services (911 in the [place] Do not drive yourself to the hospital. Summary  Syncope is when you pass out (faint) for a short time. It is caused by a sudden decrease in blood flow to the brain.  Signs that you may be about to faint include feeling dizzy, light-headed, or sick to your stomach, seeing all white or all black, or having cold, clammy skin.  If you start to feel like you might pass out, lie down  right away and raise (elevate) your feet above the level of your heart. Breathe deeply and steadily. Wait until all of the symptoms are gone. This information is not intended to replace advice given to you by your health care provider. Make sure you discuss any questions you have with your health care provider. Document Released: 11/05/2007 Document Revised: 07/01/2017 Document Reviewed: 07/01/2017 Elsevier Interactive Patient Education  2019 [employer].

## 2018-09-30 NOTE — Progress Notes (Signed)
Per patient she passed out on Saturday while she was at home at the kitchen table. Per pt she passed out Thursday when the ambulance came as well. Per pt she did not go to the urgent care or ER on Saturday. Per patient she hit her head on the wood floor and woke up 15 min later. Per pt she was not consuming any alcohol or walking she was sitting and passed out. Per pt she drinks about 6 16oz bottle water a day. Per pt when she passed out she hit the left side of her head. Per pt she have a bruise on her back and her right leg.  Per patient she do not eat no salt  Patient was seen in the ED on 09-21-18.

## 2018-10-01 ENCOUNTER — Telehealth: Payer: Self-pay | Admitting: Cardiology

## 2018-10-01 LAB — BASIC METABOLIC PANEL WITH GFR
BUN/Creatinine Ratio: 25 — ABNORMAL HIGH (ref 9–23)
BUN: 27 mg/dL — ABNORMAL HIGH (ref 6–24)
CO2: 20 mmol/L (ref 20–29)
Calcium: 9.7 mg/dL (ref 8.7–10.2)
Chloride: 108 mmol/L — ABNORMAL HIGH (ref 96–106)
Creatinine, Ser: 1.1 mg/dL — ABNORMAL HIGH (ref 0.57–1.00)
GFR calc Af Amer: 68 mL/min/1.73
GFR calc non Af Amer: 59 mL/min/1.73 — ABNORMAL LOW
Glucose: 76 mg/dL (ref 65–99)
Potassium: 4.4 mmol/L (ref 3.5–5.2)
Sodium: 141 mmol/L (ref 134–144)

## 2018-10-01 LAB — CBC WITH DIFFERENTIAL/PLATELET
Basophils Absolute: 0.1 x10E3/uL (ref 0.0–0.2)
Basos: 1 %
EOS (ABSOLUTE): 0.6 x10E3/uL — ABNORMAL HIGH (ref 0.0–0.4)
Eos: 8 %
Hematocrit: 39 % (ref 34.0–46.6)
Hemoglobin: 12.4 g/dL (ref 11.1–15.9)
Immature Grans (Abs): 0 x10E3/uL (ref 0.0–0.1)
Immature Granulocytes: 0 %
Lymphocytes Absolute: 2.7 x10E3/uL (ref 0.7–3.1)
Lymphs: 34 %
MCH: 28.1 pg (ref 26.6–33.0)
MCHC: 31.8 g/dL (ref 31.5–35.7)
MCV: 88 fL (ref 79–97)
Monocytes Absolute: 0.7 x10E3/uL (ref 0.1–0.9)
Monocytes: 9 %
Neutrophils Absolute: 3.8 x10E3/uL (ref 1.4–7.0)
Neutrophils: 48 %
Platelets: 248 x10E3/uL (ref 150–450)
RBC: 4.41 x10E6/uL (ref 3.77–5.28)
RDW: 13.2 % (ref 11.7–15.4)
WBC: 7.9 x10E3/uL (ref 3.4–10.8)

## 2018-10-01 LAB — TSH: TSH: 0.995 u[IU]/mL (ref 0.450–4.500)

## 2018-10-01 NOTE — Telephone Encounter (Signed)
Virtual Visit Pre-Appointment Phone Call  "(Name), I am calling you today to discuss your upcoming appointment. We are currently trying to limit exposure to the virus that causes COVID-19 by seeing patients at home rather than in the office."  1. "What is the BEST phone number to call the day of the visit?" - include this in appointment notes  2. Do you have or have access to (through a family member/friend) a smartphone with video capability that we can use for your visit?" a. If yes - list this number in appt notes as cell (if different from BEST phone #) and list the appointment type as a VIDEO visit in appointment notes b. If no - list the appointment type as a PHONE visit in appointment notes  3. Confirm consent - "In the setting of the current Covid19 crisis, you are scheduled for a (phone or video) visit with your provider on (date) at (time).  Just as we do with many in-office visits, in order for you to participate in this visit, we must obtain consent.  If you'd like, I can send this to your mychart (if signed up) or email for you to review.  Otherwise, I can obtain your verbal consent now.  All virtual visits are billed to your insurance company just like a normal visit would be.  By agreeing to a virtual visit, we'd like you to understand that the technology does not allow for your provider to perform an examination, and thus may limit your provider's ability to fully assess your condition. If your provider identifies any concerns that need to be evaluated in person, we will make arrangements to do so.  Finally, though the technology is pretty good, we cannot assure that it will always work on either your or our end, and in the setting of a video visit, we may have to convert it to a phone-only visit.  In either situation, we cannot ensure that we have a secure connection.  Are you willing to proceed?" STAFF: Did the patient verbally acknowledge consent to telehealth visit? Document  YES/NO here: YES  4. Advise patient to be prepared - "Two hours prior to your appointment, go ahead and check your blood pressure, pulse, oxygen saturation, and your weight (if you have the equipment to check those) and write them all down. When your visit starts, your provider will ask you for this information. If you have an Apple Watch or Kardia device, please plan to have heart rate information ready on the day of your appointment. Please have a pen and paper handy nearby the day of the visit as well."  5. Give patient instructions for MyChart download to smartphone OR Doximity/Doxy.me as below if video visit (depending on what platform provider is using)  6. Inform patient they will receive a phone call 15 minutes prior to their appointment time (may be from unknown caller ID) so they should be prepared to answer    TELEPHONE CALL NOTE  Brandy Roberts has been deemed a candidate for a follow-up tele-health visit to limit community exposure during the Covid-19 pandemic. I spoke with the patient via phone to ensure availability of phone/video source, confirm preferred email & phone number, and discuss instructions and expectations.  I reminded Brandy Roberts to be prepared with any vital sign and/or heart rhythm information that could potentially be obtained via home monitoring, at the time of her visit. I reminded Brandy Roberts to expect a phone call prior to her visit.  Frederic Jericho 10/01/2018 11:11 AM   INSTRUCTIONS FOR DOWNLOADING THE MYCHART APP TO SMARTPHONE  - The patient must first make sure to have activated MyChart and know their login information - If Apple, go to [employer] and type in MyChart in the search bar and download the app. If Android, ask patient to go to [employer] and type in [place] in the search bar and download the app. The app is free but as with any other app downloads, their phone may require them to verify saved payment information or  Apple/Android password.  - The patient will need to then log into the app with their MyChart username and password, and select [facility] as their healthcare provider to link the account. When it is time for your visit, go to the MyChart app, find appointments, and click Begin Video Visit. Be sure to Select Allow for your device to access the Microphone and Camera for your visit. You will then be connected, and your provider will be with you shortly.  **If they have any issues connecting, or need assistance please contact MyChart service desk [phone])**  **If using a computer, in order to ensure the best quality for their visit they will need to use either of the following Internet Browsers: [employer], or Google Chrome**  IF USING DOXIMITY or DOXY.ME - The patient will receive a link just prior to their visit by text.     FULL LENGTH CONSENT FOR TELE-HEALTH VISIT   I hereby voluntarily request, consent and authorize [facility] and its employed or contracted physicians, physician assistants, nurse practitioners or other licensed health care professionals (the Practitioner), to provide me with telemedicine health care services (the Services") as deemed necessary by the treating Practitioner. I acknowledge and consent to receive the Services by the Practitioner via telemedicine. I understand that the telemedicine visit will involve communicating with the Practitioner through live audiovisual communication technology and the disclosure of certain medical information by electronic transmission. I acknowledge that I have been given the opportunity to request an in-person assessment or other available alternative prior to the telemedicine visit and am voluntarily participating in the telemedicine visit.  I understand that I have the right to withhold or withdraw my consent to the use of telemedicine in the course of my care at any time, without affecting my right to future care  or treatment, and that the Practitioner or I may terminate the telemedicine visit at any time. I understand that I have the right to inspect all information obtained and/or recorded in the course of the telemedicine visit and may receive copies of available information for a reasonable fee.  I understand that some of the potential risks of receiving the Services via telemedicine include:   Delay or interruption in medical evaluation due to technological equipment failure or disruption;  Information transmitted may not be sufficient (e.g. poor resolution of images) to allow for appropriate medical decision making by the Practitioner; and/or   In rare instances, security protocols could fail, causing a breach of personal health information.  Furthermore, I acknowledge that it is my responsibility to provide information about my medical history, conditions and care that is complete and accurate to the best of my ability. I acknowledge that Practitioner's advice, recommendations, and/or decision may be based on factors not within their control, such as incomplete or inaccurate data provided by me or distortions of diagnostic images or specimens that may result from electronic transmissions. I understand that the  practice of medicine is not an Chief Strategy Officer and that Practitioner makes no warranties or guarantees regarding treatment outcomes. I acknowledge that I will receive a copy of this consent concurrently upon execution via email to the email address I last provided but may also request a printed copy by calling the office of [facility].    I understand that my insurance will be billed for this visit.   I have read or had this consent read to me.  I understand the contents of this consent, which adequately explains the benefits and risks of the Services being provided via telemedicine.   I have been provided ample opportunity to ask questions regarding this consent and the Services and have had  my questions answered to my satisfaction.  I give my informed consent for the services to be provided through the use of telemedicine in my medical care  By participating in this telemedicine visit I agree to the above.

## 2018-10-04 ENCOUNTER — Telehealth (INDEPENDENT_AMBULATORY_CARE_PROVIDER_SITE_OTHER): Payer: Medicare Other | Admitting: Cardiology

## 2018-10-04 ENCOUNTER — Other Ambulatory Visit: Payer: Self-pay

## 2018-10-04 ENCOUNTER — Encounter: Payer: Self-pay | Admitting: Cardiology

## 2018-10-04 VITALS — Ht 66.0 in | Wt 190.0 lb

## 2018-10-04 DIAGNOSIS — Z8673 Personal history of transient ischemic attack (TIA), and cerebral infarction without residual deficits: Secondary | ICD-10-CM

## 2018-10-04 DIAGNOSIS — I10 Essential (primary) hypertension: Secondary | ICD-10-CM | POA: Diagnosis not present

## 2018-10-04 DIAGNOSIS — R55 Syncope and collapse: Secondary | ICD-10-CM | POA: Diagnosis not present

## 2018-10-04 DIAGNOSIS — Z87891 Personal history of nicotine dependence: Secondary | ICD-10-CM

## 2018-10-04 DIAGNOSIS — I351 Nonrheumatic aortic (valve) insufficiency: Secondary | ICD-10-CM

## 2018-10-04 DIAGNOSIS — R002 Palpitations: Secondary | ICD-10-CM

## 2018-10-04 DIAGNOSIS — E782 Mixed hyperlipidemia: Secondary | ICD-10-CM

## 2018-10-04 NOTE — Progress Notes (Signed)
Virtual Visit via Video Note   This visit type was conducted due to national recommendations for restrictions regarding the COVID-19 Pandemic (e.g. social distancing) in an effort to limit this patient's exposure and mitigate transmission in our community.  Due to her co-morbid illnesses, this patient is at least at moderate risk for complications without adequate follow up.  This format is felt to be most appropriate for this patient at this time.  All issues noted in this document were discussed and addressed.  A limited physical exam was performed with this format.  Please refer to the patient's chart for her consent to telehealth for [facility].   Date:  10/04/2018   ID:  Brandy Roberts, DOB 1970-07-06, MRN [id number]  Patient Location: Home Provider Location: Home PCP:  Brandy Pounds, NP  Cardiologist:  No primary care provider on file.  Electrophysiologist:  None   Evaluation Performed:  New Patient Evaluation  Chief Complaint: Passing out spell and palpitations  History of Present Illness:    Brandy Roberts is a 48 y.o. female with past medical history of essential hypertension, and she has a history of stroke.  She has been a very heavy smoker in the past.  She mentions to me that she had a passing out spell and fell and hurt her back significantly.  She has seen her primary care physician for this and referred here for the same reason.  No chest pain orthopnea or PND.  She takes care of activities of daily living.  She is a poor historian.  She mentions to me that she is weak over the past several years because of her stroke and ambulates minimally.  At the time of my evaluation, the patient is alert awake oriented and in no distress.  She tells me that her doctor made medications changes but she has not affected those medication changes at this time.  The patient does not have symptoms concerning for COVID-19 infection (fever, chills, cough, or new shortness of breath).     Past Medical History:  Diagnosis Date  . Anemia   . Anxiety   . Depression   . Hyperlipidemia   . Hypertension   . Migraines   . Osteoarthritis   . Sleep apnea   . Stroke [facility])    Past Surgical History:  Procedure Laterality Date  . CESAREAN SECTION    . GANGLION CYST EXCISION    . TEE WITHOUT CARDIOVERSION N/A 10/09/2014   Procedure: TRANSESOPHAGEAL ECHOCARDIOGRAM (TEE);  Surgeon: Sanda Klein, MD;  Location: [facility] ENDOSCOPY;  Service: Cardiovascular;  Laterality: N/A;  . TONSILLECTOMY       Current Meds  Medication Sig  . amLODipine (NORVASC) 10 MG tablet Take 1 tablet (10 mg total) by mouth daily.  [name] atorvastatin (LIPITOR) 80 MG tablet Take 1 tablet (80 mg total) by mouth daily at 6 PM.  . clopidogrel (PLAVIX) 75 MG tablet Take 1 tablet (75 mg total) by mouth daily.  [name] gabapentin (NEURONTIN) 300 MG capsule TAKE 1 CAPSULE BY MOUTH TWICE DAILY  . Galcanezumab-gnlm (EMGALITY) 120 MG/ML SOSY Inject 120 mg into the skin every 30 (thirty) days.  . hydrALAZINE (APRESOLINE) 50 MG tablet TAKE 1 TABLET BY MOUTH 3 TIMES DAILY WITH MEALS.  [name] lisinopril (PRINIVIL,ZESTRIL) 40 MG tablet Take 1 tablet (40 mg total) by mouth daily.  . metoCLOPramide (REGLAN) 10 MG tablet Take 1 tablet (10 mg total) by mouth every 6 (six) hours as needed for nausea (and headache).  . metoprolol tartrate (  LOPRESSOR) 25 MG tablet Take 1 tablet (25 mg total) by mouth 2 (two) times daily.  . pantoprazole (PROTONIX) 20 MG tablet Take 1 tablet (20 mg total) by mouth daily.  [name] spironolactone (ALDACTONE) 25 MG tablet Take 1 tablet (25 mg total) by mouth daily.  [name] topiramate (TOPAMAX) 50 MG tablet Take 1 tablet (50 mg total) by mouth daily.  . traZODone (DESYREL) 100 MG tablet Take 1 tablet (100 mg total) by mouth at bedtime.  [name] venlafaxine XR (EFFEXOR XR) 75 MG 24 hr capsule Take 3 capsules (225 mg total) by mouth daily with breakfast.     Allergies:   Ibuprofen and Tylenol [acetaminophen]   Social History    Tobacco Use  . Smoking status: Former Smoker    Packs/day: 0.25    Last attempt to quit: 08/31/2017    Years since quitting: 1.0  . Smokeless tobacco: Never Used  . Tobacco comment: Pt. still smoke marijuana.   Substance Use Topics  . Alcohol use: No    Alcohol/week: 0.0 standard drinks  . Drug use: Yes    Types: Marijuana     Family Hx: The patient's family history includes Cancer in her maternal aunt and mother; Diabetes in her sister; Hypertension in her mother and sister.  ROS:   Please see the history of present illness.    As mentioned above All other systems reviewed and are negative.   Prior CV studies:   The following studies were reviewed today:  I reviewed records from Dr. Thurman Coyer office including his reports and echocardiogram report  Labs/Other Tests and Data Reviewed:    EKG:    Recent Labs: 09/30/2018: BUN 27; Creatinine, Ser 1.10; Hemoglobin 12.4; Platelets 248; Potassium 4.4; Sodium 141; TSH 0.995   Recent Lipid Panel Lab Results  Component Value Date/Time   CHOL 111 12/15/2017 02:44 PM   TRIG 61 12/15/2017 02:44 PM   HDL 42 12/15/2017 02:44 PM   CHOLHDL 2.6 12/15/2017 02:44 PM   CHOLHDL 3.3 10/25/2016 02:48 AM   LDLCALC 57 12/15/2017 02:44 PM    Wt Readings from Last 3 Encounters:  10/04/18 190 lb (86.2 kg)  09/30/18 190 lb 12.8 oz (86.5 kg)  09/29/18 190 lb (86.2 kg)     Objective:    Vital Signs:  Ht 5\' 6"  (1.676 m)   Wt 190 lb (86.2 kg)   BMI 30.67 kg/m    VITAL SIGNS:  reviewed  ASSESSMENT & PLAN:    1. Syncope: I reviewed patient's EKG and found it unremarkable.  In view of the patient's syncope I also reviewed blood work.  We will do a 2-week ZIO monitoring.  Patient mentions to me that when her daughter comes in today she will tell her about the medications done by her primary care doctor.  I told her to keep a track of her blood pressures.  We will call her back on Wednesday for an appointment and go through these issues.  At  this time in view of the viral pandemic I am not keen on doing any further evaluation such as stress testing.  I will try to attempt that in the future.  This is in view of the viral pandemic.  Again patient has multiple risk factors for coronary artery disease but does not complain of any chest pain or chest tightness.  It is to be noted that she leads a significantly sedentary lifestyle because of issues with stroke in the past.  She knows to go to the  nearest emergency room for any concerning symptoms. 2. Essential hypertension: Her blood pressure is stable.  COVID-19 Education: The signs and symptoms of COVID-19 were discussed with the patient and how to seek care for testing (follow up with PCP or arrange E-visit).  The importance of social distancing was discussed today.  Time:   Today, I have spent 30 minutes with the patient with telehealth technology discussing the above problems.  Total amount of time including review of chart was 45 minutes.   Medication Adjustments/Labs and Tests Ordered: Current medicines are reviewed at length with the patient today.  Concerns regarding medicines are outlined above.   Tests Ordered: No orders of the defined types were placed in this encounter.   Medication Changes: No orders of the defined types were placed in this encounter.   Disposition:  Follow up in 2 day(s)  Signed, Jenean Lindau, MD  10/04/2018 10:40 AM    [place]

## 2018-10-04 NOTE — Patient Instructions (Addendum)
Medication Instructions:  Your physician recommends that you continue on your current medications as directed. Please refer to the Current Medication list given to you today.  If you need a refill on your cardiac medications before your next appointment, please call your pharmacy.   Lab work: NONE If you have labs (blood work) drawn today and your tests are completely normal, you will receive your results only by: [name] MyChart Message (if you have MyChart) OR . A paper copy in the mail If you have any lab test that is abnormal or we need to change your treatment, we will call you to review the results.  Testing/Procedures: Your physician has recommended that you wear a ZIO monitor. ZIO monitors are medical devices that record the heart's electrical activity. Doctors most often use these monitors to diagnose arrhythmias. Arrhythmias are problems with the speed or rhythm of the heartbeat. The monitor is a small, portable device. You can wear one while you do your normal daily activities. This is usually used to diagnose what is causing palpitations/syncope (passing out).You will wear this device for 14 days.    Follow-Up: At [facility], you and your health needs are our priority.  As part of our continuing mission to provide you with exceptional heart care, we have created designated Provider Care Teams.  These Care Teams include your primary Cardiologist (physician) and Advanced Practice Providers (APPs -  Physician Assistants and Nurse Practitioners) who all work together to provide you with the care you need, when you need it. You will need a follow up appointment in 3 days.

## 2018-10-06 ENCOUNTER — Other Ambulatory Visit: Payer: Self-pay

## 2018-10-06 ENCOUNTER — Ambulatory Visit: Payer: Medicare Other | Attending: Nurse Practitioner | Admitting: Nurse Practitioner

## 2018-10-06 ENCOUNTER — Encounter: Payer: Self-pay | Admitting: Nurse Practitioner

## 2018-10-06 DIAGNOSIS — Z8673 Personal history of transient ischemic attack (TIA), and cerebral infarction without residual deficits: Secondary | ICD-10-CM | POA: Insufficient documentation

## 2018-10-06 DIAGNOSIS — R55 Syncope and collapse: Secondary | ICD-10-CM | POA: Diagnosis not present

## 2018-10-06 DIAGNOSIS — M1990 Unspecified osteoarthritis, unspecified site: Secondary | ICD-10-CM | POA: Insufficient documentation

## 2018-10-06 DIAGNOSIS — Z8249 Family history of ischemic heart disease and other diseases of the circulatory system: Secondary | ICD-10-CM | POA: Diagnosis not present

## 2018-10-06 DIAGNOSIS — Z87891 Personal history of nicotine dependence: Secondary | ICD-10-CM | POA: Insufficient documentation

## 2018-10-06 DIAGNOSIS — R002 Palpitations: Secondary | ICD-10-CM | POA: Insufficient documentation

## 2018-10-06 DIAGNOSIS — Z7901 Long term (current) use of anticoagulants: Secondary | ICD-10-CM | POA: Insufficient documentation

## 2018-10-06 DIAGNOSIS — G43709 Chronic migraine without aura, not intractable, without status migrainosus: Secondary | ICD-10-CM | POA: Insufficient documentation

## 2018-10-06 DIAGNOSIS — I10 Essential (primary) hypertension: Secondary | ICD-10-CM | POA: Insufficient documentation

## 2018-10-06 DIAGNOSIS — E785 Hyperlipidemia, unspecified: Secondary | ICD-10-CM | POA: Diagnosis not present

## 2018-10-06 DIAGNOSIS — I351 Nonrheumatic aortic (valve) insufficiency: Secondary | ICD-10-CM | POA: Insufficient documentation

## 2018-10-06 MED ORDER — MISC. DEVICES MISC: 0 refills | Status: DC

## 2018-10-06 NOTE — Progress Notes (Signed)
Virtual Visit via Telephone Note Due to national recommendations of social distancing due to [place] 19, telehealth visit is felt to be most appropriate for this patient at this time.  I discussed the limitations, risks, security and privacy concerns of performing an evaluation and management service by telephone and the availability of in person appointments. I also discussed with the patient that there may be a patient responsible charge related to this service. The patient expressed understanding and agreed to proceed.    I connected with Brandy Roberts on 10/06/18  at  10:30 AM EDT  EDT by telephone and verified that I am speaking with the correct person using two identifiers.   Consent I discussed the limitations, risks, security and privacy concerns of performing an evaluation and management service by telephone and the availability of in person appointments. I also discussed with the patient that there may be a patient responsible charge related to this service. The patient expressed understanding and agreed to proceed.   Location of Patient: Private Residence   Location of Provider: [place] and [place] participating in Telemedicine visit: [name] FNP-BC [place]    History of Present Illness: Telemedicine visit for: Follow up Past Medical History:  Diagnosis Date  . Anemia   . Anxiety   . Chronic ischemic vertebrobasilar artery thalamic stroke   . Depression   . Hyperlipidemia   . Hypertension   . Migraines   . Nonrheumatic aortic (valve) insufficiency   . Osteoarthritis   . Sleep apnea   . Stroke ([place]) 10/25/2016     Syncope Currently being followed by Cardiology for syncope with loss of consciousness and palpitations. She will be scheduled for 2 week ZIO monitoring. She is also being followed by Neurology Dr. Tomi Likens for migraines and syncope with MRI scheduled 10-11-2018 and possible EEG pending. Her  Metoprolol was decreased by another provider in this office several days ago due to bradycardia. Unfortunately she is out of metoprolol, was not aware of medication change and has not picked up her new prescription.    Today she reports persistent headaches. Denies any syncopal episodes since her last office visit here. Currently using a cane/walker for stability with ambulation.    Essential Hypertension She does not have a blood pressure monitor. Will send a prescription for monitor to [facility]. I instructed her that based on her insurance plan the monitor may or may not be covered. Current medications include spironolactone 25 mg daily, lopressor 25 mg BID, lisinopril 40 mg daily, and hydralazine 50 mg TID. Denies chest pain, shortness of breath or BLE edema.  BP Readings from Last 3 Encounters:  09/30/18 97/63  07/14/18 108/69  06/29/18 126/66    Past Surgical History:  Procedure Laterality Date  . CESAREAN SECTION    . GANGLION CYST EXCISION    . TEE WITHOUT CARDIOVERSION N/A 10/09/2014   Procedure: TRANSESOPHAGEAL ECHOCARDIOGRAM (TEE);  Surgeon: Sanda Klein, MD;  Location: [facility] ENDOSCOPY;  Service: Cardiovascular;  Laterality: N/A;  . TONSILLECTOMY      Family History  Problem Relation Age of Onset  . Cancer Mother        type unknown  . Hypertension Mother   . Hypertension Sister   . Diabetes Sister   . Cancer Maternal Aunt        4 aunts died of cancer types unknown    Social History   Socioeconomic History  . Marital status: Single    Spouse  name: Not on file  . Number of children: 2  . Years of education: 65  . Highest education level: Not on file  Occupational History  . Occupation: N/A  Social Needs  . Financial resource strain: Not on file  . Food insecurity:    Worry: Not on file    Inability: Not on file  . Transportation needs:    Medical: Not on file    Non-medical: Not on file  Tobacco Use  . Smoking status: Former Smoker    Packs/day: 0.25    Last  attempt to quit: 08/31/2017    Years since quitting: 1.0  . Smokeless tobacco: Never Used  . Tobacco comment: Pt. still smoke marijuana.   Substance and Sexual Activity  . Alcohol use: No    Alcohol/week: 0.0 standard drinks  . Drug use: Yes    Types: Marijuana  . Sexual activity: Yes    Birth control/protection: Post-menopausal  Lifestyle  . Physical activity:    Days per week: Not on file    Minutes per session: Not on file  . Stress: Not on file  Relationships  . Social connections:    Talks on phone: Not on file    Gets together: Not on file    Attends religious service: Not on file    Active member of club or organization: Not on file    Attends meetings of clubs or organizations: Not on file    Relationship status: Not on file  Other Topics Concern  . Not on file  Social History Narrative   Patient lives with her uncle in a one story home.  Has 2 sons.  Currently on disability.  Education: high school.   Drinks 1-2 sodas a week      Observations/Objective: Awake, alert and oriented x 3   Review of Systems  Constitutional: Negative for fever, malaise/fatigue and weight loss.  HENT: Negative.  Negative for nosebleeds.   Eyes: Negative.  Negative for blurred vision, double vision and photophobia.  Respiratory: Negative.  Negative for cough and shortness of breath.   Cardiovascular: Negative.  Negative for chest pain, palpitations and leg swelling.  Gastrointestinal: Negative.  Negative for heartburn, nausea and vomiting.  Musculoskeletal: Positive for falls. Negative for myalgias.  Neurological: Positive for weakness and headaches. Negative for dizziness, focal weakness and seizures.  Psychiatric/Behavioral: Positive for depression. Negative for suicidal ideas. The patient is nervous/anxious.    Assessment and Plan:  Diagnoses and all orders for this visit:  Chronic migraine without aura without status migrainosus, not intractable Follow up with Neurology She was  instructed to call 911 if she has another episode of syncope, loss of consciousness or severe worsening headache despite medication compliance.   Syncope, unspecified syncope type Continue follow up with Cardiology and Neurology  Essential hypertension -     Misc. Devices MISC; Please provide patient with insurance approved blood pressure monitor.     Follow Up Instructions Return in about 6 weeks (around 11/17/2018).     I discussed the assessment and treatment plan with the patient. The patient was provided an opportunity to ask questions and all were answered. The patient agreed with the plan and demonstrated an understanding of the instructions.   The patient was advised to call back or seek an in-person evaluation if the symptoms worsen or if the condition fails to improve as anticipated.  I provided 25 minutes of non-face-to-face time during this encounter including median intraservice time, reviewing previous notes, labs, imaging,  medications and explaining diagnosis and management.  [name], FNP-BC

## 2018-10-07 ENCOUNTER — Telehealth (INDEPENDENT_AMBULATORY_CARE_PROVIDER_SITE_OTHER): Payer: Medicare Other | Admitting: Cardiology

## 2018-10-07 ENCOUNTER — Encounter: Payer: Self-pay | Admitting: Cardiology

## 2018-10-07 VITALS — Ht 66.0 in | Wt 190.0 lb

## 2018-10-07 DIAGNOSIS — Z9119 Patient's noncompliance with other medical treatment and regimen: Secondary | ICD-10-CM

## 2018-10-07 DIAGNOSIS — Z91199 Patient's noncompliance with other medical treatment and regimen due to unspecified reason: Secondary | ICD-10-CM

## 2018-10-07 DIAGNOSIS — I6930 Unspecified sequelae of cerebral infarction: Secondary | ICD-10-CM

## 2018-10-07 DIAGNOSIS — E663 Overweight: Secondary | ICD-10-CM

## 2018-10-07 DIAGNOSIS — I10 Essential (primary) hypertension: Secondary | ICD-10-CM

## 2018-10-07 DIAGNOSIS — R55 Syncope and collapse: Secondary | ICD-10-CM

## 2018-10-07 DIAGNOSIS — Z87891 Personal history of nicotine dependence: Secondary | ICD-10-CM

## 2018-10-07 HISTORY — DX: Syncope and collapse: R55

## 2018-10-07 NOTE — Telephone Encounter (Signed)
Family medical supply called in requesting diagnoses on supply that was ordered for pt please follow up

## 2018-10-07 NOTE — Progress Notes (Signed)
Virtual Visit via Video Note   This visit type was conducted due to national recommendations for restrictions regarding the COVID-19 Pandemic (e.g. social distancing) in an effort to limit this patient's exposure and mitigate transmission in our community.  Due to her co-morbid illnesses, this patient is at least at moderate risk for complications without adequate follow up.  This format is felt to be most appropriate for this patient at this time.  All issues noted in this document were discussed and addressed.  A limited physical exam was performed with this format.  Please refer to the patient's chart for her consent to telehealth for [facility].   Date:  10/07/2018   ID:  Brandy Roberts, DOB [birth year], MRN [id number]  Patient Location: Home Provider Location: Home PCP:  [name], NP  Cardiologist:  No primary care provider on file.  Electrophysiologist:  None   Evaluation Performed:  Follow-Up Visit  Chief Complaint: Syncope and essential hypertension  History of Present Illness:    Brandy Roberts is a 48 y.o. female with past medical history of syncope and essential hypertension.  She has had a stroke and followed by neurologist.  She denies any problems at this time and takes care of activities of daily living.  Since her last evaluation and since her last syncope she has had no such issues.  She told us about her medications clearly today.  No chest pain orthopnea or PND.  She is awaiting for her ZIO monitor.  At the time of my evaluation, the patient is alert awake oriented and in no distress.  The patient does not have symptoms concerning for COVID-19 infection (fever, chills, cough, or new shortness of breath).    Past Medical History:  Diagnosis Date  . Anemia   . Anxiety   . Chronic ischemic vertebrobasilar artery thalamic stroke   . Depression   . Hyperlipidemia   . Hypertension   . Migraines   . Nonrheumatic aortic (valve) insufficiency   .  Osteoarthritis   . Sleep apnea   . Stroke [facility]) 10/25/2016   Past Surgical History:  Procedure Laterality Date  . CESAREAN SECTION    . GANGLION CYST EXCISION    . TEE WITHOUT CARDIOVERSION N/A 10/09/2014   Procedure: TRANSESOPHAGEAL ECHOCARDIOGRAM (TEE);  Surgeon: Sanda Klein, MD;  Location: [facility] ENDOSCOPY;  Service: Cardiovascular;  Laterality: N/A;  . TONSILLECTOMY       Current Meds  Medication Sig  . amLODipine (NORVASC) 10 MG tablet Take 1 tablet (10 mg total) by mouth daily.  [name] atorvastatin (LIPITOR) 80 MG tablet Take 1 tablet (80 mg total) by mouth daily at 6 PM.  . clopidogrel (PLAVIX) 75 MG tablet Take 1 tablet (75 mg total) by mouth daily.  [name] gabapentin (NEURONTIN) 300 MG capsule TAKE 1 CAPSULE BY MOUTH TWICE DAILY  . Galcanezumab-gnlm (EMGALITY) 120 MG/ML SOSY Inject 120 mg into the skin every 30 (thirty) days.  . hydrALAZINE (APRESOLINE) 50 MG tablet TAKE 1 TABLET BY MOUTH 3 TIMES DAILY WITH MEALS.  [name] lisinopril (PRINIVIL,ZESTRIL) 40 MG tablet Take 1 tablet (40 mg total) by mouth daily.  . metoCLOPramide (REGLAN) 10 MG tablet Take 1 tablet (10 mg total) by mouth every 6 (six) hours as needed for nausea (and headache).  . metoprolol tartrate (LOPRESSOR) 50 MG tablet Take 25 mg by mouth 2 (two) times daily.  . Misc. Devices MISC Please provide patient with insurance approved blood pressure monitor.  . pantoprazole (PROTONIX) 20 MG tablet Take 1  tablet (20 mg total) by mouth daily.  [name] spironolactone (ALDACTONE) 25 MG tablet Take 1 tablet (25 mg total) by mouth daily.  [name] topiramate (TOPAMAX) 50 MG tablet Take 1 tablet (50 mg total) by mouth daily.  . traZODone (DESYREL) 100 MG tablet Take 1 tablet (100 mg total) by mouth at bedtime.  [name] venlafaxine XR (EFFEXOR XR) 75 MG 24 hr capsule Take 3 capsules (225 mg total) by mouth daily with breakfast.     Allergies:   Ibuprofen and Tylenol [acetaminophen]   Social History   Tobacco Use  . Smoking status: Former Smoker     Packs/day: 0.25    Last attempt to quit: 08/31/2017    Years since quitting: 1.1  . Smokeless tobacco: Never Used  . Tobacco comment: Pt. still smoke marijuana.   Substance Use Topics  . Alcohol use: No    Alcohol/week: 0.0 standard drinks  . Drug use: Yes    Types: Marijuana     Family Hx: The patient's family history includes Cancer in her maternal aunt and mother; Diabetes in her sister; Hypertension in her mother and sister.  ROS:   Please see the history of present illness.    As mentioned above All other systems reviewed and are negative.   Prior CV studies:   The following studies were reviewed today:  None  Labs/Other Tests and Data Reviewed:    EKG:  No ECG reviewed.  Recent Labs: 09/30/2018: BUN 27; Creatinine, Ser 1.10; Hemoglobin 12.4; Platelets 248; Potassium 4.4; Sodium 141; TSH 0.995   Recent Lipid Panel Lab Results  Component Value Date/Time   CHOL 111 12/15/2017 02:44 PM   TRIG 61 12/15/2017 02:44 PM   HDL 42 12/15/2017 02:44 PM   CHOLHDL 2.6 12/15/2017 02:44 PM   CHOLHDL 3.3 10/25/2016 02:48 AM   LDLCALC 57 12/15/2017 02:44 PM    Wt Readings from Last 3 Encounters:  10/07/18 190 lb (86.2 kg)  10/04/18 190 lb (86.2 kg)  09/30/18 190 lb 12.8 oz (86.5 kg)     Objective:    Vital Signs:  Ht 5\' 6"  (1.676 m)   Wt 190 lb (86.2 kg)   BMI 30.67 kg/m    VITAL SIGNS:  reviewed  ASSESSMENT & PLAN:    1. Syncope: Patient has had no such issues.  This is following her last syncopal episode.  She is now taking care of activities of daily living.  No dizzy spells or such problems.  She is awaiting her blood pressure machine that her medical doctor has prescribed.  She will check her blood pressures twice a day when it arrives.  She is also waiting for the ZIO monitor to come to her in the mail. 2. In view of history of hypertension I would like to get a echocardiogram done on her in the next week or 2.  This will help me assess for hypertensive heart  disease and such issues. 3. She will be seen in follow-up appointment in 1 week or earlier if she has any concerns.  Hopefully she receives a blood pressure monitor by that time and keeps a track of her blood pressures.  COVID-19 Education: The signs and symptoms of COVID-19 were discussed with the patient and how to seek care for testing (follow up with PCP or arrange E-visit).  The importance of social distancing was discussed today.  Time:   Today, I have spent 17 minutes with the patient with telehealth technology discussing the above problems.  Medication Adjustments/Labs and Tests Ordered: Current medicines are reviewed at length with the patient today.  Concerns regarding medicines are outlined above.   Tests Ordered: No orders of the defined types were placed in this encounter.   Medication Changes: No orders of the defined types were placed in this encounter.   Disposition:  Follow up 1 week Signed, Jenean Lindau, MD  10/07/2018 10:06 AM    [place]

## 2018-10-07 NOTE — Patient Instructions (Addendum)
Medication Instructions:  Your physician recommends that you continue on your current medications as directed. Please refer to the Current Medication list given to you today.  If you need a refill on your cardiac medications before your next appointment, please call your pharmacy.   Lab work: NONE If you have labs (blood work) drawn today and your tests are completely normal, you will receive your results only by: [name] MyChart Message (if you have MyChart) OR . A paper copy in the mail If you have any lab test that is abnormal or we need to change your treatment, we will call you to review the results.  Testing/Procedures: NONE   Follow-Up: At [facility], you and your health needs are our priority.  As part of our continuing mission to provide you with exceptional heart care, we have created designated Provider Care Teams.  These Care Teams include your primary Cardiologist (physician) and Advanced Practice Providers (APPs -  Physician Assistants and Nurse Practitioners) who all work together to provide you with the care you need, when you need it. You will need a follow up appointment in 1 weeks.

## 2018-10-11 ENCOUNTER — Other Ambulatory Visit: Payer: Self-pay

## 2018-10-11 ENCOUNTER — Ambulatory Visit
Admission: RE | Admit: 2018-10-11 | Discharge: 2018-10-11 | Disposition: A | Discharge status: Home / Self Care | Payer: Medicare Other | Source: Ambulatory Visit | Attending: Neurology | Admitting: Neurology

## 2018-10-11 DIAGNOSIS — R55 Syncope and collapse: Secondary | ICD-10-CM | POA: Diagnosis not present

## 2018-10-11 DIAGNOSIS — R4020 Unspecified coma: Secondary | ICD-10-CM

## 2018-10-11 MED ORDER — GADOBENATE DIMEGLUMINE 529 MG/ML IV SOLN
18.0000 mL | Freq: Once | INTRAVENOUS | Status: AC | PRN
  Administered 2018-10-11: 18 mL via INTRAVENOUS

## 2018-10-12 ENCOUNTER — Encounter: Payer: Self-pay | Admitting: Cardiology

## 2018-10-12 ENCOUNTER — Telehealth (INDEPENDENT_AMBULATORY_CARE_PROVIDER_SITE_OTHER): Payer: Medicare Other | Admitting: Cardiology

## 2018-10-12 ENCOUNTER — Encounter: Payer: Self-pay | Admitting: *Deleted

## 2018-10-12 ENCOUNTER — Telehealth: Payer: Self-pay | Admitting: Cardiology

## 2018-10-12 ENCOUNTER — Other Ambulatory Visit: Payer: Self-pay

## 2018-10-12 ENCOUNTER — Telehealth: Payer: Self-pay

## 2018-10-12 VITALS — Ht 66.0 in | Wt 190.0 lb

## 2018-10-12 DIAGNOSIS — Z87891 Personal history of nicotine dependence: Secondary | ICD-10-CM

## 2018-10-12 DIAGNOSIS — R0789 Other chest pain: Secondary | ICD-10-CM

## 2018-10-12 DIAGNOSIS — E782 Mixed hyperlipidemia: Secondary | ICD-10-CM

## 2018-10-12 DIAGNOSIS — R002 Palpitations: Secondary | ICD-10-CM

## 2018-10-12 DIAGNOSIS — I10 Essential (primary) hypertension: Secondary | ICD-10-CM

## 2018-10-12 DIAGNOSIS — R079 Chest pain, unspecified: Secondary | ICD-10-CM

## 2018-10-12 HISTORY — DX: Other chest pain: R07.89

## 2018-10-12 HISTORY — DX: Palpitations: R00.2

## 2018-10-12 MED ORDER — NITROGLYCERIN 0.4 MG SL SUBL: 0.4000 mg | SUBLINGUAL_TABLET | SUBLINGUAL | 3 refills | Status: DC | PRN

## 2018-10-12 NOTE — Telephone Encounter (Signed)
Called and advised Pt of MRI results

## 2018-10-12 NOTE — Progress Notes (Signed)
Virtual Visit via Video Note   This visit type was conducted due to national recommendations for restrictions regarding the COVID-19 Pandemic (e.g. social distancing) in an effort to limit this patient's exposure and mitigate transmission in our community.  Due to her co-morbid illnesses, this patient is at least at moderate risk for complications without adequate follow up.  This format is felt to be most appropriate for this patient at this time.  All issues noted in this document were discussed and addressed.  A limited physical exam was performed with this format.  Please refer to the patient's chart for her consent to telehealth for [facility].   Date:  10/12/2018   ID:  Brandy Roberts, DOB [birth year], MRN [id number]  Patient Location: Home Provider Location: Home  PCP:  [name], NP  Cardiologist:  No primary care provider on file.  Electrophysiologist:  None   Evaluation Performed:  Follow-Up Visit  Chief Complaint: Chest discomfort and palpitations  History of Present Illness:    Brandy Roberts is a 48 y.o. female with past medical history of essential hypertension.  She also has history of stroke.  She mentions to me that she when she walks up to the mailbox she has chest tightness.  She is a poor historian.  This chest tightness has no radiation to the neck or to the arm specifically.  But she mentions to me that she gives out doing it and therefore she has been concerned.  Her palpitations have persisted.  She has received a monitor and will hookup to it shortly.  At the time of my evaluation, the patient is alert awake oriented and in no distress.  The patient does not have symptoms concerning for COVID-19 infection (fever, chills, cough, or new shortness of breath).    Past Medical History:  Diagnosis Date  . Anemia   . Anxiety   . Chronic ischemic vertebrobasilar artery thalamic stroke   . Depression   . Hyperlipidemia   . Hypertension   . Migraines    . Nonrheumatic aortic (valve) insufficiency   . Osteoarthritis   . Sleep apnea   . Stroke [facility]) 10/25/2016   Past Surgical History:  Procedure Laterality Date  . CESAREAN SECTION    . GANGLION CYST EXCISION    . TEE WITHOUT CARDIOVERSION N/A 10/09/2014   Procedure: TRANSESOPHAGEAL ECHOCARDIOGRAM (TEE);  Surgeon: Sanda Klein, MD;  Location: [facility] ENDOSCOPY;  Service: Cardiovascular;  Laterality: N/A;  . TONSILLECTOMY       Current Meds  Medication Sig  . amLODipine (NORVASC) 10 MG tablet Take 1 tablet (10 mg total) by mouth daily.  [name] atorvastatin (LIPITOR) 80 MG tablet Take 1 tablet (80 mg total) by mouth daily at 6 PM.  . clopidogrel (PLAVIX) 75 MG tablet Take 1 tablet (75 mg total) by mouth daily.  [name] gabapentin (NEURONTIN) 300 MG capsule TAKE 1 CAPSULE BY MOUTH TWICE DAILY  . Galcanezumab-gnlm (EMGALITY) 120 MG/ML SOSY Inject 120 mg into the skin every 30 (thirty) days.  . hydrALAZINE (APRESOLINE) 50 MG tablet TAKE 1 TABLET BY MOUTH 3 TIMES DAILY WITH MEALS.  [name] lisinopril (PRINIVIL,ZESTRIL) 40 MG tablet Take 1 tablet (40 mg total) by mouth daily.  . metoCLOPramide (REGLAN) 10 MG tablet Take 1 tablet (10 mg total) by mouth every 6 (six) hours as needed for nausea (and headache).  . metoprolol tartrate (LOPRESSOR) 50 MG tablet Take 25 mg by mouth 2 (two) times daily.  . Misc. Devices MISC Please provide patient with  insurance approved blood pressure monitor.  . pantoprazole (PROTONIX) 20 MG tablet Take 1 tablet (20 mg total) by mouth daily.  [name] spironolactone (ALDACTONE) 25 MG tablet Take 1 tablet (25 mg total) by mouth daily.  [name] topiramate (TOPAMAX) 50 MG tablet Take 1 tablet (50 mg total) by mouth daily.  . traZODone (DESYREL) 100 MG tablet Take 1 tablet (100 mg total) by mouth at bedtime.  [name] venlafaxine XR (EFFEXOR XR) 75 MG 24 hr capsule Take 3 capsules (225 mg total) by mouth daily with breakfast.     Allergies:   Ibuprofen and Tylenol [acetaminophen]   Social History    Tobacco Use  . Smoking status: Former Smoker    Packs/day: 0.25    Last attempt to quit: 08/31/2017    Years since quitting: 1.1  . Smokeless tobacco: Never Used  . Tobacco comment: Pt. still smoke marijuana.   Substance Use Topics  . Alcohol use: No    Alcohol/week: 0.0 standard drinks  . Drug use: Yes    Types: Marijuana     Family Hx: The patient's family history includes Cancer in her maternal aunt and mother; Diabetes in her sister; Hypertension in her mother and sister.  ROS:   Please see the history of present illness.    As mentioned above All other systems reviewed and are negative.   Prior CV studies:   The following studies were reviewed today:  I reviewed echocardiogram report with her  Labs/Other Tests and Data Reviewed:    EKG:  No ECG reviewed.  Recent Labs: 09/30/2018: BUN 27; Creatinine, Ser 1.10; Hemoglobin 12.4; Platelets 248; Potassium 4.4; Sodium 141; TSH 0.995   Recent Lipid Panel Lab Results  Component Value Date/Time   CHOL 111 12/15/2017 02:44 PM   TRIG 61 12/15/2017 02:44 PM   HDL 42 12/15/2017 02:44 PM   CHOLHDL 2.6 12/15/2017 02:44 PM   CHOLHDL 3.3 10/25/2016 02:48 AM   LDLCALC 57 12/15/2017 02:44 PM    Wt Readings from Last 3 Encounters:  10/12/18 190 lb (86.2 kg)  10/07/18 190 lb (86.2 kg)  10/04/18 190 lb (86.2 kg)     Objective:    Vital Signs:  Ht 5\' 6"  (1.676 m)   Wt 190 lb (86.2 kg)   BMI 30.67 kg/m    VITAL SIGNS:  reviewed  ASSESSMENT & PLAN:    1. Palpitations: I discussed my findings with her at extensive length.  She will initiate monitoring now as discussed in the previous visit and we will do accordingly based on the findings.  She has not has any dizzy spells or any passing out spells. 2. Essential hypertension: She is waiting for blood pressure machine and once she received it she will keep a track of her blood pressures 3. Chest discomfort: She has multiple risk factors for coronary artery disease and  following recommendations were made.  Sublingual nitroglycerin prescription was sent, its protocol and 911 protocol explained and the patient vocalized understanding questions were answered to the patient's satisfaction.  She will have a Lexiscan sestamibi in the next 2 to 3 weeks.  She knows to go to the nearest emergency room if she has any significant concerns 4. She will be seen in follow-up appointment in a month or earlier if she has any concerns.  COVID-19 Education: The signs and symptoms of COVID-19 were discussed with the patient and how to seek care for testing (follow up with PCP or arrange E-visit).  The importance of social distancing was  discussed today.  Time:   Today, I have spent 25 minutes with the patient with telehealth technology discussing the above problems.     Medication Adjustments/Labs and Tests Ordered: Current medicines are reviewed at length with the patient today.  Concerns regarding medicines are outlined above.   Tests Ordered: No orders of the defined types were placed in this encounter.   Medication Changes: No orders of the defined types were placed in this encounter.   Disposition:  Follow up in 1 month(s)  Signed, Jenean Lindau, MD  10/12/2018 9:26 AM    [place]

## 2018-10-12 NOTE — Patient Instructions (Signed)
Medication Instructions:  When having chest pain, stop what you are doing and sit down. Take 1 nitro, wait 5 minutes. Still having chest pain, take 1 nitro, wait 5 minutes. Still having chest pain, take 1 nitro, dial 911. Total of 3 nitro in 15 minutes. If you need a refill on your cardiac medications before your next appointment, please call your pharmacy.   Lab work: None If you have labs (blood work) drawn today and your tests are completely normal, you will receive your results only by: [name] MyChart Message (if you have MyChart) OR . A paper copy in the mail If you have any lab test that is abnormal or we need to change your treatment, we will call you to review the results.  Testing/Procedures: Your physician has requested that you have a lexiscan myoview. For further information please visit [email or web address]. Please follow instruction sheet, as given.    [facility] [address] [place],  [address] Phone:  [phone]  Oct 12, 2018    Brandy Roberts DOB: 04-09-1971 MRN: [id number] [place] Brandy Roberts [place] [address]   Dear Brandy Roberts,  You are scheduled for a Myocardial Perfusion Imaging Study on:  June 2,2020 at 7:45.  Please arrive 15 minutes prior to your appointment time for registration and insurance purposes.  The test will take approximately 3 to 4 hours to complete; you may bring reading material.  If someone comes with you to your appointment, they will need to remain in the main lobby due to limited space in the testing area. **If you are pregnant or breastfeeding, please notify the nuclear lab prior to your appointment**  How to prepare for your Myocardial Perfusion Test: . Do not eat or drink 3 hours prior to your test, except you may have water. . Do not consume products containing caffeine (regular or decaffeinated) 12 hours prior to your test. (ex: coffee, chocolate, sodas, tea). Do bring a list of your  current medications with you.  If not listed below, you may take your medications as normal. . Do not take metoprolol (Lopressor, Toprol) for 24 hours prior to the test.  Bring the medication to your appointment as you may be required to take it once the test is complete. . Do wear comfortable clothes (no dresses or overalls) and walking shoes, tennis shoes preferred (No heels or open toe shoes are allowed). . Do NOT wear cologne, perfume, aftershave, or lotions (deodorant is allowed). . If these instructions are not followed, your test will have to be rescheduled.  Please report to [address] for your test.  If you have questions or concerns about your appointment, you can call the [facility] at [phone].  If you cannot keep your appointment, please provide 24 hours notification to the Nuclear Lab, to avoid a possible $50 charge to your account.   Follow-Up: At [facility], you and your health needs are our priority.  As part of our continuing mission to provide you with exceptional heart care, we have created designated Provider Care Teams.  These Care Teams include your primary Cardiologist (physician) and Advanced Practice Providers (APPs -  Physician Assistants and Nurse Practitioners) who all work together to provide you with the care you need, when you need it. You will need a follow up appointment in 1 months.  Any Other Special Instructions Will Be Listed Below (If Applicable).

## 2018-10-12 NOTE — Telephone Encounter (Signed)
-----   Message from Pieter Partridge, DO sent at 10/12/2018  9:15 AM EDT ----- MRI of brain reveals no new changes.

## 2018-10-12 NOTE — Telephone Encounter (Signed)
°*  STAT* Pharmacy Change  1. Which medications need to be refilled? (please list name of each medication and dose if known) Eliquis 5MG  2. Which pharmacy/location (including street and city if local pharmacy) is medication to be sent to? [place]; [place]  3. Do they need a 30 day or 90 day supply? 90 day

## 2018-10-12 NOTE — Addendum Note (Signed)
Addended by: Particia Nearing B on: 10/12/2018 10:13 AM   Modules accepted: Orders

## 2018-10-13 ENCOUNTER — Telehealth: Payer: Self-pay

## 2018-10-13 NOTE — Telephone Encounter (Signed)
Call placed to [facility].  Spoke to Reagan who stated that the BP monitor was shipped to the patient

## 2018-10-13 NOTE — Telephone Encounter (Signed)
[facility] refaxed it.

## 2018-10-14 ENCOUNTER — Other Ambulatory Visit (INDEPENDENT_AMBULATORY_CARE_PROVIDER_SITE_OTHER): Payer: Medicaid Other

## 2018-10-14 DIAGNOSIS — R55 Syncope and collapse: Secondary | ICD-10-CM

## 2018-10-21 ENCOUNTER — Other Ambulatory Visit: Payer: Self-pay

## 2018-10-21 ENCOUNTER — Ambulatory Visit (INDEPENDENT_AMBULATORY_CARE_PROVIDER_SITE_OTHER): Payer: Medicare Other | Admitting: Neurology

## 2018-10-21 DIAGNOSIS — R55 Syncope and collapse: Secondary | ICD-10-CM

## 2018-10-21 DIAGNOSIS — R4020 Unspecified coma: Secondary | ICD-10-CM | POA: Diagnosis not present

## 2018-10-21 DIAGNOSIS — Z8673 Personal history of transient ischemic attack (TIA), and cerebral infarction without residual deficits: Secondary | ICD-10-CM

## 2018-10-21 DIAGNOSIS — Z8669 Personal history of other diseases of the nervous system and sense organs: Secondary | ICD-10-CM | POA: Diagnosis not present

## 2018-10-22 ENCOUNTER — Telehealth: Payer: Self-pay

## 2018-10-22 NOTE — Telephone Encounter (Signed)
-----   Message from Pieter Partridge, DO sent at 10/22/2018  8:37 AM EDT ----- EEG is normal.  No evidence to suggest seizures.

## 2018-10-22 NOTE — Procedures (Signed)
ELECTROENCEPHALOGRAM REPORT  Date of Study: 10/21/2018  Patient's Name: Brandy Roberts MRN: [id number] Date of Birth: 05-20-1971  Clinical History: 48 year old woman with history of stroke and migraine who presents for syncope.  Medications: Emgality Gabapentin Reglan Topiramate Effexor Trazodone Lopressor Norvasc Lisinopril Hydralazine  Technical Summary: A multichannel digital EEG recording measured by the international 10-20 system with electrodes applied with paste and impedances below 5000 ohms performed in our laboratory with EKG monitoring in an awake and drowsy patient.  Hyperventilation was not performed as patient was wearing a mask for COVID-19.  Photic stimulation was performed.  The digital EEG was referentially recorded, reformatted, and digitally filtered in a variety of bipolar and referential montages for optimal display.    Description: The patient is awake and drowsy during the recording.  During maximal wakefulness, there is a symmetric, medium voltage 10 Hz posterior dominant rhythm that attenuates with eye opening.  The record is symmetric.  During drowsiness and sleep, there is an increase in theta slowing of the background.  Vertex waves and symmetric sleep spindles were seen.  Hyperventilation and photic stimulation did not elicit any abnormalities.  There were no epileptiform discharges or electrographic seizures seen.    EKG lead was unremarkable.  Impression: This awake and drowsy EEG is normal.    Clinical Correlation: A normal EEG does not exclude a clinical diagnosis of epilepsy.  If further clinical questions remain, prolonged EEG may be helpful.  Clinical correlation is advised.   Metta Clines, DO

## 2018-10-22 NOTE — Telephone Encounter (Signed)
Called and advised Pt of EEG results

## 2018-10-29 ENCOUNTER — Other Ambulatory Visit: Payer: Self-pay

## 2018-10-29 DIAGNOSIS — G43009 Migraine without aura, not intractable, without status migrainosus: Secondary | ICD-10-CM

## 2018-10-29 MED ORDER — GALCANEZUMAB-GNLM 120 MG/ML ~~LOC~~ SOSY: 120.0000 mg | PREFILLED_SYRINGE | SUBCUTANEOUS | 3 refills | Status: DC

## 2018-10-29 NOTE — Progress Notes (Addendum)
Virtual Visit via Telephone Note The purpose of this virtual visit is to provide medical care while limiting exposure to the novel coronavirus.    Consent was obtained for video visit:  Yes.   Answered questions that patient had about telehealth interaction:  Yes.   I discussed the limitations, risks, security and privacy concerns of performing an evaluation and management service by telephone. I also discussed with the patient that there may be a patient responsible charge related to this service. The patient expressed understanding and agreed to proceed.  Pt location: Home Physician Location: Home Name of referring provider:  [name], NP I connected with [name] at patients initiation/request on 11/01/2018 at 10:40 AM EDT by telephone and verified that I am speaking with the correct person using two identifiers. Pt MRN:  [id number] Pt DOB:  22-May-1971 Video Participants:  [name]   History of Present Illness:  Brandy Roberts is a 48 year old right-handed woman with aortic insufficiency, hypertension, hyperlipidemia and history of stroke who follows up for headache and syncope.  UPDATE: Due to episodes of recurrent syncopal events, she underwent workup.  MRI of brain with and without contrast on 10/11/18 was personally reviewed and demonstrated stable chronic infarcts but no acute intracranial abnormality.  EEG performed on 10/21/18 was normal. No recurrent spells.  Intensity:  Moderate Duration:  2 to 3 hours Frequency:  2 days a month Current NSAIDS: Flurbiprofen 100mg  Current analgesics: None Current triptans: None Current ergotamine: None Current anti-emetic: Zofran 4 mg, Reglan 10 mg Current muscle relaxants: None Current anti-anxiolytic: None Current sleep aide: trazodone Current Antihypertensive medications: Amlodipine 10 mg, hydralazine, Spironolactone, lisinopril, Lopressor Current Antidepressant medications: Venlafaxine XR 225 mg Current  Anticonvulsant medications: Topiramate 50 mg at bedtime, gabapentin 300 mg twice daily Current anti-CGRP: Emgality Current Vitamins/Herbal/Supplements: None Current Antihistamines/Decongestants: None Other therapy: None Hormone/birth control: None  Caffeine: Coffee, sweet tea, not daily Alcohol: No Smoker: Cigarettes Diet: Hydrates Exercise: No Depression: Yes; Anxiety: Yes Other pain: Chronic right knee pain Sleep hygiene: Sometimes tosses and turns  In May, she endorsed increased problems with balance and falls.  She stated that they were associated with headache and worsening of her left-sided weakness.  MRI of the brain without contrast from 11/13/2017 was personally reviewed and demonstrated no new intracranial abnormalities.  HISTORY:  Onset: Migraines since she was young, but worse since her stroke in May 2016. Location: holocephalic Quality: pounding Initial Intensity: 10/10 Aura: no Prodrome: no Postdrome: no Associated symptoms: Photophobia, phonophobia. Rarely nausea. She has not had any new worse headache of her life, waking up from sleep Initial Duration: 2 days (but Fioricet and ibuprofen lowers intensity down to 2-3/10) Initial Frequency: Once or twice a month Frequency of abortive medication: only as needed Triggers: None Relieving factors: Laying down. Butalbital, ibuprofen. Tramadol helped but she was told not to take it. Activity: aggravates  Past NSAIDS: Ibuprofen, naproxen Past analgesics: Tylenol, Excedrin, Tramadol (effective but GI upset), Fioricet Past abortive triptans: no Past muscle relaxants: no Past anti-emetic: no Past antihypertensive medications: no Past antidepressant medications: no Past anticonvulsant medications: no Past vitamins/Herbal/Supplements: no Other past therapies: no  Family history of headache: Sister.  STROKE: She had a stroke in May 2016 with left sided weakness. MRI of brain from 10/06/14  was personally reviewed and revealed small acute lacunar infarcts in the right thalamus and right corona radiata, as well as chronic lacunar infarcts in the left hemisphere. CTA of head and neck revealed diffuse bilateral  petrous and cavernous carotid stenosis. Cardiac source of embolus was not discovered.   She was admitted to [facility] from 10/24/16 to 10/26/16 for stroke-like event. She suddenly developed left sided weakness and slurred speech with associated headache. Blood pressure in ED was 120/66. CT of head was personally reviewed and negative for acute abnormality. She had refused tPA. MRI of brain was personally reviewed and revealed no acute stroke or bleed. MRA of head revealed no significant intracranial stenosis or occlusion. LDL was 84. Hgb A1c was 5.7. As per neurology evaluation, her deficits appeared to be non-organic, with unusual speech pattern and giveaway weakness. Somatization was suspected. 2D echo from 11/06/16 demonstrated normal LV EF of 65-70% with no cardiac source of emboli. Atorvastatin was increased from 10mg  to 80mg  daily. Differential includes TIA vs hemiplegic migraine vs somatization.  SYNCOPE: On 09/21/18, she presented to the ED at [facility] for a 5 day intractable holocephalic throbbing headache.  She reports that when EMS was leading her to the ambulance from her bedroom, she passed out for 20-30 seconds.  She described sensation of lightheadedness, like she was going to pass out, as well as tunnel vision and palpitations.  She was afebrile.  CT of head revealed her old left basal ganglia infarct but no acute abnormality.  She was treated with Reglan and Benadryl and headache reportedly resolved.  On 09/26/18, she was sitting on her barstool when she felt lightheaded again.  She noted palpitations as well.  She got up and started walking to her bedroom when she noted tunnel vision and passed out.  She woke up on the floor and bruised her back and  leg.  She thinks she may have been unconsciousness for 20 to 30 minute.  There were no witnesses.  She noted urinary incontinence but did not bite her tongue.  She did not have a headache.  She denies change in medications over the past week.  She denies fever or illness.    Past Medical History: Past Medical History:  Diagnosis Date  . Anemia   . Anxiety   . Chronic ischemic vertebrobasilar artery thalamic stroke   . Depression   . Hyperlipidemia   . Hypertension   . Migraines   . Nonrheumatic aortic (valve) insufficiency   . Osteoarthritis   . Sleep apnea   . Stroke ([place]) 10/25/2016    Medications: Outpatient Encounter Medications as of 11/01/2018  Medication Sig  . amLODipine (NORVASC) 10 MG tablet Take 1 tablet (10 mg total) by mouth daily.  [name] atorvastatin (LIPITOR) 80 MG tablet Take 1 tablet (80 mg total) by mouth daily at 6 PM.  . clopidogrel (PLAVIX) 75 MG tablet Take 1 tablet (75 mg total) by mouth daily.  [name] gabapentin (NEURONTIN) 300 MG capsule TAKE 1 CAPSULE BY MOUTH TWICE DAILY  . Galcanezumab-gnlm (EMGALITY) 120 MG/ML SOSY Inject 120 mg into the skin every 30 (thirty) days.  . hydrALAZINE (APRESOLINE) 50 MG tablet TAKE 1 TABLET BY MOUTH 3 TIMES DAILY WITH MEALS.  [name] lisinopril (PRINIVIL,ZESTRIL) 40 MG tablet Take 1 tablet (40 mg total) by mouth daily.  . metoCLOPramide (REGLAN) 10 MG tablet Take 1 tablet (10 mg total) by mouth every 6 (six) hours as needed for nausea (and headache).  . metoprolol tartrate (LOPRESSOR) 50 MG tablet Take 25 mg by mouth 2 (two) times daily.  . Misc. Devices MISC Please provide patient with insurance approved blood pressure monitor.  . nitroGLYCERIN (NITROSTAT) 0.4 MG SL tablet Place 1 tablet (  0.4 mg total) under the tongue every 5 (five) minutes as needed for chest pain.  . pantoprazole (PROTONIX) 20 MG tablet Take 1 tablet (20 mg total) by mouth daily.  [name] spironolactone (ALDACTONE) 25 MG tablet Take 1 tablet (25 mg total) by mouth daily.  [name]  topiramate (TOPAMAX) 50 MG tablet Take 1 tablet (50 mg total) by mouth daily.  . traZODone (DESYREL) 100 MG tablet Take 1 tablet (100 mg total) by mouth at bedtime.  [name] venlafaxine XR (EFFEXOR XR) 75 MG 24 hr capsule Take 3 capsules (225 mg total) by mouth daily with breakfast.   No facility-administered encounter medications on file as of 11/01/2018.     Allergies: Allergies  Allergen Reactions  . Ibuprofen     Can not take with current medications  . Tylenol [Acetaminophen] Other (See Comments)    Pt stated tylenol gives her extreme headache    Family History: Family History  Problem Relation Age of Onset  . Cancer Mother        type unknown  . Hypertension Mother   . Hypertension Sister   . Diabetes Sister   . Cancer Maternal Aunt        4 aunts died of cancer types unknown    Social History: Social History   Socioeconomic History  . Marital status: Single    Spouse name: Not on file  . Number of children: 2  . Years of education: 25  . Highest education level: Not on file  Occupational History  . Occupation: N/A  Social Needs  . Financial resource strain: Not on file  . Food insecurity:    Worry: Not on file    Inability: Not on file  . Transportation needs:    Medical: Not on file    Non-medical: Not on file  Tobacco Use  . Smoking status: Former Smoker    Packs/day: 0.25    Last attempt to quit: 08/31/2017    Years since quitting: 1.1  . Smokeless tobacco: Never Used  . Tobacco comment: Pt. still smoke marijuana.   Substance and Sexual Activity  . Alcohol use: No    Alcohol/week: 0.0 standard drinks  . Drug use: Yes    Types: Marijuana  . Sexual activity: Yes    Birth control/protection: Post-menopausal  Lifestyle  . Physical activity:    Days per week: Not on file    Minutes per session: Not on file  . Stress: Not on file  Relationships  . Social connections:    Talks on phone: Not on file    Gets together: Not on file    Attends religious  service: Not on file    Active member of club or organization: Not on file    Attends meetings of clubs or organizations: Not on file    Relationship status: Not on file  . Intimate partner violence:    Fear of current or ex partner: Not on file    Emotionally abused: Not on file    Physically abused: Not on file    Forced sexual activity: Not on file  Other Topics Concern  . Not on file  Social History Narrative   Patient lives with her uncle in a one story home.  Has 2 sons.  Currently on disability.  Education: high school.   Drinks 1-2 sodas a week    Observations/Objective:   Blood pressure 128/71, height 5\' 6"  (1.676 m), weight 200 lb (90.7 kg). Speech fluent and not dysarthric.  Language  intact.   Assessment and Plan:   1.  Migraine without aura, without status migrainosus, not intractable 2.  Recurrent loss of consciousness/syncope.  Semiology characteristic of syncope.  May be due to orthostatic hypotension, vasovagal or cardiac source.  The first event occurred in setting of severe migraine, which may have been vasovagal.   3.  Hemiplegia of left side as late effect of stroke 4.  Hypertension   1.  Emgality for migraine prophylaxis.  To decrease polypharmacy, we will stop topiramate (25mg  at bedtime for a week, then stop) 2.  Flurbiprofen 100mg  for migraine abortive therapy 3.  Limit use of pain relievers to no more than 2 days out of week to prevent risk of rebound or medication-overuse headache. 4.  Keep headache diary 5.  Follow up in 6 months.  Follow Up Instructions:    -I discussed the assessment and treatment plan with the patient. The patient was provided an opportunity to ask questions and all were answered. The patient agreed with the plan and demonstrated an understanding of the instructions.   The patient was advised to call back or seek an in-person evaluation if the symptoms worsen or if the condition fails to improve as anticipated.  Total time spent  with patient on telephone:  20 minutes  Dudley Major, DO

## 2018-11-01 ENCOUNTER — Telehealth (INDEPENDENT_AMBULATORY_CARE_PROVIDER_SITE_OTHER): Payer: Medicare Other | Admitting: Neurology

## 2018-11-01 ENCOUNTER — Other Ambulatory Visit: Payer: Self-pay

## 2018-11-01 ENCOUNTER — Encounter: Payer: Self-pay | Admitting: Neurology

## 2018-11-01 VITALS — BP 128/71 | Ht 66.0 in | Wt 200.0 lb

## 2018-11-01 DIAGNOSIS — G43009 Migraine without aura, not intractable, without status migrainosus: Secondary | ICD-10-CM

## 2018-11-01 DIAGNOSIS — I10 Essential (primary) hypertension: Secondary | ICD-10-CM

## 2018-11-01 DIAGNOSIS — I69959 Hemiplegia and hemiparesis following unspecified cerebrovascular disease affecting unspecified side: Secondary | ICD-10-CM

## 2018-11-01 DIAGNOSIS — R55 Syncope and collapse: Secondary | ICD-10-CM

## 2018-11-01 MED ORDER — FLURBIPROFEN 100 MG PO TABS: ORAL_TABLET | ORAL | 3 refills | Status: DC

## 2018-11-02 ENCOUNTER — Telehealth: Payer: Medicare Other | Admitting: Neurology

## 2018-11-02 ENCOUNTER — Telehealth (HOSPITAL_COMMUNITY): Payer: Self-pay | Admitting: *Deleted

## 2018-11-02 NOTE — Telephone Encounter (Signed)
Close encounter 

## 2018-11-03 ENCOUNTER — Other Ambulatory Visit: Payer: Self-pay

## 2018-11-03 ENCOUNTER — Ambulatory Visit (HOSPITAL_COMMUNITY)
Admission: RE | Admit: 2018-11-03 | Discharge: 2018-11-03 | Disposition: A | Discharge status: Home / Self Care | Payer: Medicare Other | Source: Ambulatory Visit | Attending: Cardiology | Admitting: Cardiology

## 2018-11-03 DIAGNOSIS — R079 Chest pain, unspecified: Secondary | ICD-10-CM | POA: Insufficient documentation

## 2018-11-03 LAB — MYOCARDIAL PERFUSION IMAGING
LV dias vol: 141 mL (ref 46–106)
LV sys vol: 56 mL
Peak HR: 91 {beats}/min
Rest HR: 49 {beats}/min
SDS: 4
SRS: 0
SSS: 4
TID: 0.98

## 2018-11-03 MED ORDER — AMINOPHYLLINE 25 MG/ML IV SOLN
75.0000 mg | Freq: Once | INTRAVENOUS | Status: AC
  Administered 2018-11-03: 75 mg via INTRAVENOUS

## 2018-11-03 MED ORDER — REGADENOSON 0.4 MG/5ML IV SOLN
0.4000 mg | Freq: Once | INTRAVENOUS | Status: AC
  Administered 2018-11-03: 0.4 mg via INTRAVENOUS

## 2018-11-03 MED ORDER — TECHNETIUM TC 99M TETROFOSMIN IV KIT
30.8000 | PACK | Freq: Once | INTRAVENOUS | Status: AC | PRN
  Administered 2018-11-03: 30.8 via INTRAVENOUS
  Filled 2018-11-03: qty 31

## 2018-11-03 MED ORDER — TECHNETIUM TC 99M TETROFOSMIN IV KIT
10.4000 | PACK | Freq: Once | INTRAVENOUS | Status: AC | PRN
  Administered 2018-11-03: 10.4 via INTRAVENOUS
  Filled 2018-11-03: qty 11

## 2018-11-05 ENCOUNTER — Telehealth: Payer: Self-pay

## 2018-11-05 NOTE — Telephone Encounter (Signed)
-----   Message from Rajan R Revankar, MD sent at 11/04/2018 11:26 AM EDT ----- The results of the study is unremarkable. Please inform patient. I will discuss in detail at next appointment. Cc  primary care/referring physician Rajan R Revankar, MD 11/04/2018 11:26 AM 

## 2018-11-05 NOTE — Telephone Encounter (Signed)
Left detailed voice message on patients phone regarding test results.

## 2018-11-10 ENCOUNTER — Telehealth (INDEPENDENT_AMBULATORY_CARE_PROVIDER_SITE_OTHER): Payer: Medicare Other | Admitting: Cardiology

## 2018-11-10 ENCOUNTER — Encounter: Payer: Self-pay | Admitting: Cardiology

## 2018-11-10 ENCOUNTER — Other Ambulatory Visit: Payer: Self-pay

## 2018-11-10 VITALS — BP 121/61 | HR 56 | Ht 66.0 in | Wt 198.0 lb

## 2018-11-10 DIAGNOSIS — Z8679 Personal history of other diseases of the circulatory system: Secondary | ICD-10-CM

## 2018-11-10 DIAGNOSIS — I10 Essential (primary) hypertension: Secondary | ICD-10-CM | POA: Diagnosis not present

## 2018-11-10 DIAGNOSIS — E782 Mixed hyperlipidemia: Secondary | ICD-10-CM | POA: Diagnosis not present

## 2018-11-10 DIAGNOSIS — Z87891 Personal history of nicotine dependence: Secondary | ICD-10-CM

## 2018-11-10 DIAGNOSIS — R55 Syncope and collapse: Secondary | ICD-10-CM

## 2018-11-10 NOTE — Progress Notes (Signed)
Virtual Visit via Video Note   This visit type was conducted due to national recommendations for restrictions regarding the COVID-19 Pandemic (e.g. social distancing) in an effort to limit this patient's exposure and mitigate transmission in our community.  Due to her co-morbid illnesses, this patient is at least at moderate risk for complications without adequate follow up.  This format is felt to be most appropriate for this patient at this time.  All issues noted in this document were discussed and addressed.  A limited physical exam was performed with this format.  Please refer to the patient's chart for her consent to telehealth for [facility].   Date:  11/10/2018   ID:  Brandy Roberts, DOB 26-Dec-1970, MRN [id number]  Patient Location: Home Provider Location: Office  PCP:  [name], NP  Cardiologist:  Jenean Lindau, MD  Electrophysiologist:  None   Evaluation Performed:  Follow-Up Visit  Chief Complaint: Essential hypertension and history of syncope  History of Present Illness:    Brandy Roberts is a 48 y.o. female with past medical history of essential hypertension and dyslipidemia.  She denies any problems at this time and takes care of activities of daily living.  She leads a sedentary lifestyle.  No chest pain orthopnea or PND.  The patient does not have symptoms concerning for COVID-19 infection (fever, chills, cough, or new shortness of breath).    Past Medical History:  Diagnosis Date  . Anemia   . Anxiety   . Chronic ischemic vertebrobasilar artery thalamic stroke   . Depression   . Hyperlipidemia   . Hypertension   . Migraines   . Nonrheumatic aortic (valve) insufficiency   . Osteoarthritis   . Sleep apnea   . Stroke [facility]) 10/25/2016   Past Surgical History:  Procedure Laterality Date  . CESAREAN SECTION    . GANGLION CYST EXCISION    . TEE WITHOUT CARDIOVERSION N/A 10/09/2014   Procedure: TRANSESOPHAGEAL ECHOCARDIOGRAM (TEE);  Surgeon:  Sanda Klein, MD;  Location: [facility] ENDOSCOPY;  Service: Cardiovascular;  Laterality: N/A;  . TONSILLECTOMY       Current Meds  Medication Sig  . amLODipine (NORVASC) 10 MG tablet Take 1 tablet (10 mg total) by mouth daily.  [name] atorvastatin (LIPITOR) 80 MG tablet Take 1 tablet (80 mg total) by mouth daily at 6 PM.  . clopidogrel (PLAVIX) 75 MG tablet Take 1 tablet (75 mg total) by mouth daily.  [name] EMGALITY 120 MG/ML SOAJ Inject 130 mg into the skin every 30 (thirty) days.  . flurbiprofen (ANSAID) 100 MG tablet 1 tablet every 8 hours, maximum 3 tablets in 24h  . gabapentin (NEURONTIN) 300 MG capsule TAKE 1 CAPSULE BY MOUTH TWICE DAILY     Allergies:   Ibuprofen and Tylenol [acetaminophen]   Social History   Tobacco Use  . Smoking status: Former Smoker    Packs/day: 0.25    Last attempt to quit: 08/31/2017    Years since quitting: 1.1  . Smokeless tobacco: Never Used  . Tobacco comment: Pt. still smoke marijuana.   Substance Use Topics  . Alcohol use: No    Alcohol/week: 0.0 standard drinks  . Drug use: Yes    Types: Marijuana     Family Hx: The patient's family history includes Cancer in her maternal aunt and mother; Diabetes in her sister; Hypertension in her mother and sister.  ROS:   Please see the history of present illness.    As mentioned above All other systems reviewed  and are negative.   Prior CV studies:   The following studies were reviewed today:  Study Highlights     The left ventricular ejection fraction is normal (55-65%).  Nuclear stress EF: 60%.  There was no ST segment deviation noted during stress.  Defect 1: There is a small defect of mild severity present in the apex location. No ischemia.  This is a low risk study.   Candee Furbish, MD      Labs/Other Tests and Data Reviewed:    EKG:  No ECG reviewed.  Recent Labs: 09/30/2018: BUN 27; Creatinine, Ser 1.10; Hemoglobin 12.4; Platelets 248; Potassium 4.4; Sodium 141; TSH 0.995   Recent  Lipid Panel Lab Results  Component Value Date/Time   CHOL 111 12/15/2017 02:44 PM   TRIG 61 12/15/2017 02:44 PM   HDL 42 12/15/2017 02:44 PM   CHOLHDL 2.6 12/15/2017 02:44 PM   CHOLHDL 3.3 10/25/2016 02:48 AM   LDLCALC 57 12/15/2017 02:44 PM    Wt Readings from Last 3 Encounters:  11/10/18 198 lb (89.8 kg)  11/03/18 190 lb (86.2 kg)  11/01/18 200 lb (90.7 kg)     Objective:    Vital Signs:  BP 121/61 (BP Location: Right Arm, Patient Position: Sitting, Cuff Size: Normal)   Pulse (!) 56   Ht 5\' 6"  (1.676 m)   Wt 198 lb (89.8 kg)   BMI 31.96 kg/m    VITAL SIGNS:  reviewed  ASSESSMENT & PLAN:    1. Essential hypertension: Her blood pressure is stable.  She is doing well with her diet especially salt intake.  She is trying to ambulate to the best of her ability. 2. Mixed dyslipidemia: Lipids are followed by her primary care physician. 3. Results of the stress test were discussed with the patient at length and they were unremarkable.  We are awaiting the monitor report and we will get in touch with her as soon as we obtain it. 4. Patient will be seen in follow-up appointment in 2 months or earlier if the patient has any concerns   COVID-19 Education: The signs and symptoms of COVID-19 were discussed with the patient and how to seek care for testing (follow up with PCP or arrange E-visit).  The importance of social distancing was discussed today.  Time:   Today, I have spent 15 minutes with the patient with telehealth technology discussing the above problems.     Medication Adjustments/Labs and Tests Ordered: Current medicines are reviewed at length with the patient today.  Concerns regarding medicines are outlined above.   Tests Ordered: No orders of the defined types were placed in this encounter.   Medication Changes: No orders of the defined types were placed in this encounter.   Disposition:  Follow up in 2 month(s)  Signed, Jenean Lindau, MD  11/10/2018 3:00  PM    [place]

## 2018-11-10 NOTE — Patient Instructions (Signed)
Medication Instructions: Your physician recommends that you continue on your current medications as directed. Please refer to the Current Medication list given to you today.  If you need a refill on your cardiac medications before your next appointment, please call your pharmacy.   Lab work: NONE If you have labs (blood work) drawn today and your tests are completely normal, you will receive your results only by: . MyChart Message (if you have MyChart) OR . A paper copy in the mail If you have any lab test that is abnormal or we need to change your treatment, we will call you to review the results.  Testing/Procedures: NONE  Follow-Up: At [facility], you and your health needs are our priority.  As part of our continuing mission to provide you with exceptional heart care, we have created designated Provider Care Teams.  These Care Teams include your primary Cardiologist (physician) and Advanced Practice Providers (APPs -  Physician Assistants and Nurse Practitioners) who all work together to provide you with the care you need, when you need it. You will need a follow up appointment in 2 months.    

## 2018-11-11 ENCOUNTER — Telehealth: Payer: Medicare Other | Admitting: Cardiology

## 2018-11-24 ENCOUNTER — Other Ambulatory Visit: Payer: Medicare Other | Admitting: Nurse Practitioner

## 2018-11-26 ENCOUNTER — Telehealth: Payer: Self-pay

## 2018-11-26 NOTE — Telephone Encounter (Signed)
Results relayed to patient, no further questions. Copy of results sent to Dr. Raul Del per Dr. Geraldo Pitter request.

## 2018-11-26 NOTE — Telephone Encounter (Signed)
-----   Message from Jenean Lindau, MD sent at 11/25/2018  1:35 PM EDT ----- The results of the study is unremarkable. Please inform patient. I will discuss in detail at next appointment. Cc  primary care/referring physician Jenean Lindau, MD 11/25/2018 1:35 PM

## 2018-11-29 ENCOUNTER — Other Ambulatory Visit: Payer: Medicare Other | Admitting: Nurse Practitioner

## 2019-01-05 ENCOUNTER — Encounter: Payer: Self-pay | Admitting: Cardiology

## 2019-01-05 ENCOUNTER — Ambulatory Visit (INDEPENDENT_AMBULATORY_CARE_PROVIDER_SITE_OTHER): Payer: Medicare Other | Admitting: Cardiology

## 2019-01-05 ENCOUNTER — Other Ambulatory Visit: Payer: Self-pay

## 2019-01-05 VITALS — BP 138/76 | HR 67 | Ht 66.0 in | Wt 189.0 lb

## 2019-01-05 DIAGNOSIS — R55 Syncope and collapse: Secondary | ICD-10-CM

## 2019-01-05 DIAGNOSIS — F17200 Nicotine dependence, unspecified, uncomplicated: Secondary | ICD-10-CM | POA: Diagnosis not present

## 2019-01-05 DIAGNOSIS — I10 Essential (primary) hypertension: Secondary | ICD-10-CM | POA: Diagnosis not present

## 2019-01-05 DIAGNOSIS — R0789 Other chest pain: Secondary | ICD-10-CM

## 2019-01-05 NOTE — Patient Instructions (Signed)

## 2019-01-05 NOTE — Progress Notes (Signed)
Cardiology Office Note:    Date:  01/05/2019   ID:  Brandy Roberts, DOB 1970/08/25, MRN [id number]  PCP:  [name], NP  Cardiologist:  Jenean Lindau, MD   Referring MD: [name], NP    ASSESSMENT:    1. Chest discomfort   2. Syncope and collapse   3. Essential hypertension   4. Tobacco use disorder    PLAN:    In order of problems listed above:  1. Chest discomfort: Primary prevention stressed with the patient.  Importance of compliance with diet and medication stressed and she vocalized understanding. 2. Essential hypertension: Blood pressure stable 3. History of smoking: I spent 5 minutes with the patient discussing solely about smoking. Smoking cessation was counseled. I suggested to the patient also different medications and pharmacological interventions. Patient is keen to try stopping on its own at this time. He will get back to me if he needs any further assistance in this matter. 4. I reviewed reports of echocardiogram, stress test and event monitoring with the patient at length and she vocalized understanding and questions were answered to her satisfaction. 5. Patient will be seen in follow-up appointment in 6 months or earlier if the patient has any concerns    Medication Adjustments/Labs and Tests Ordered: Current medicines are reviewed at length with the patient today.  Concerns regarding medicines are outlined above.  No orders of the defined types were placed in this encounter.  No orders of the defined types were placed in this encounter.    No chief complaint on file.    History of Present Illness:    Brandy Roberts is a 48 y.o. female.  Patient was evaluated by me for chest discomfort essential hypertension.  She denies any problems at this time and takes care of activities of daily living.  No chest pain orthopnea or PND.  Her right arm is hurting her and he changes with position.  No chest pain.  She is ambulating on a regular  basis.  Unfortunately she continues to smoke though minimally.  At the time of my evaluation, the patient is alert awake oriented and in no distress.  Past Medical History:  Diagnosis Date  . Anemia   . Anxiety   . Chronic ischemic vertebrobasilar artery thalamic stroke   . Depression   . Hyperlipidemia   . Hypertension   . Migraines   . Nonrheumatic aortic (valve) insufficiency   . Osteoarthritis   . Sleep apnea   . Stroke [facility]) 10/25/2016    Past Surgical History:  Procedure Laterality Date  . CESAREAN SECTION    . GANGLION CYST EXCISION    . TEE WITHOUT CARDIOVERSION N/A 10/09/2014   Procedure: TRANSESOPHAGEAL ECHOCARDIOGRAM (TEE);  Surgeon: Sanda Klein, MD;  Location: [facility] ENDOSCOPY;  Service: Cardiovascular;  Laterality: N/A;  . TONSILLECTOMY      Current Medications: Current Meds  Medication Sig  . amLODipine (NORVASC) 10 MG tablet Take 1 tablet (10 mg total) by mouth daily.  [name] atorvastatin (LIPITOR) 80 MG tablet Take 1 tablet (80 mg total) by mouth daily at 6 PM.  . clopidogrel (PLAVIX) 75 MG tablet Take 1 tablet (75 mg total) by mouth daily.  [name] EMGALITY 120 MG/ML SOAJ Inject 130 mg into the skin every 30 (thirty) days.  . flurbiprofen (ANSAID) 100 MG tablet 1 tablet every 8 hours, maximum 3 tablets in 24h  . gabapentin (NEURONTIN) 300 MG capsule TAKE 1 CAPSULE BY MOUTH TWICE DAILY  . hydrALAZINE (APRESOLINE)  50 MG tablet TAKE 1 TABLET BY MOUTH 3 TIMES DAILY WITH MEALS.  [name] lisinopril (PRINIVIL,ZESTRIL) 40 MG tablet Take 1 tablet (40 mg total) by mouth daily.  . metoCLOPramide (REGLAN) 10 MG tablet Take 1 tablet (10 mg total) by mouth every 6 (six) hours as needed for nausea (and headache).  . metoprolol tartrate (LOPRESSOR) 50 MG tablet Take 25 mg by mouth 2 (two) times daily.  . Misc. Devices MISC Please provide patient with insurance approved blood pressure monitor.  . nitroGLYCERIN (NITROSTAT) 0.4 MG SL tablet Place 1 tablet (0.4 mg total) under the tongue every 5  (five) minutes as needed for chest pain.  . pantoprazole (PROTONIX) 20 MG tablet Take 1 tablet (20 mg total) by mouth daily.  [name] spironolactone (ALDACTONE) 25 MG tablet Take 1 tablet (25 mg total) by mouth daily.  . traZODone (DESYREL) 100 MG tablet Take 1 tablet (100 mg total) by mouth at bedtime.  [name] venlafaxine XR (EFFEXOR XR) 75 MG 24 hr capsule Take 3 capsules (225 mg total) by mouth daily with breakfast.     Allergies:   Ibuprofen and Tylenol [acetaminophen]   Social History   Socioeconomic History  . Marital status: Single    Spouse name: Not on file  . Number of children: 2  . Years of education: 64  . Highest education level: Not on file  Occupational History  . Occupation: N/A  Social Needs  . Financial resource strain: Not on file  . Food insecurity    Worry: Not on file    Inability: Not on file  . Transportation needs    Medical: Not on file    Non-medical: Not on file  Tobacco Use  . Smoking status: Former Smoker    Packs/day: 0.25    Quit date: 08/31/2017    Years since quitting: 1.3  . Smokeless tobacco: Never Used  . Tobacco comment: Pt. still smoke marijuana.   Substance and Sexual Activity  . Alcohol use: No    Alcohol/week: 0.0 standard drinks  . Drug use: Yes    Types: Marijuana  . Sexual activity: Yes    Birth control/protection: Post-menopausal  Lifestyle  . Physical activity    Days per week: Not on file    Minutes per session: Not on file  . Stress: Not on file  Relationships  . Social Herbalist on phone: Not on file    Gets together: Not on file    Attends religious service: Not on file    Active member of club or organization: Not on file    Attends meetings of clubs or organizations: Not on file    Relationship status: Not on file  Other Topics Concern  . Not on file  Social History Narrative   Patient lives with her uncle in a one story home.  Has 2 sons.  Currently on disability.  Education: high school.   Drinks 1-2 sodas  a week      Family History: The patient's family history includes Cancer in her maternal aunt and mother; Diabetes in her sister; Hypertension in her mother and sister.  ROS:   Please see the history of present illness.    All other systems reviewed and are negative.  EKGs/Labs/Other Studies Reviewed:    The following studies were reviewed today: EVENT MONITOR REPORT:   Patient was monitored from 10/14/2018 to 10/27/2018. Indication:  Syncope and collapse Ordering physician:  Jenean Lindau, MD  Referring physician:        Jenean Lindau, MD    Baseline rhythm: Sinus  Minimum heart rate: 47 BPM.  Average heart rate: 69 BPM.  Maximal heart rate 132 BPM.  Atrial arrhythmia: None significant.  Rare PACs  Ventricular arrhythmia: None significant rare ventricular bigeminy and trigeminy  Conduction abnormality: None significant  Symptoms: None significant   Conclusion:  Mildly abnormal but overall unremarkable event monitor.  Interpreting  cardiologist: Jenean Lindau, MD  Date: 11/25/2018 1:33 PM   IMPRESSIONS    1. The left ventricle has normal systolic function of 93-79%. The cavity size was normal. There is no increased left ventricular wall thickness. Echo evidence of impaired diastolic relaxation.  2. The right ventricle has normal systolic function. The cavity was normal. There is no increase in right ventricular wall thickness.  3. Left atrial size was mildly dilated.  4. The mitral valve is normal in structure.  5. The tricuspid valve is normal in structure.  6. Mild aortic regurgitation.     Recent Labs: 09/30/2018: BUN 27; Creatinine, Ser 1.10; Hemoglobin 12.4; Platelets 248; Potassium 4.4; Sodium 141; TSH 0.995  Recent Lipid Panel    Component Value Date/Time   CHOL 111 12/15/2017 1444   TRIG 61 12/15/2017 1444   HDL 42 12/15/2017 1444   CHOLHDL 2.6 12/15/2017 1444   CHOLHDL 3.3 10/25/2016 0248   VLDL 8  10/25/2016 0248   LDLCALC 57 12/15/2017 1444    Physical Exam:    VS:  BP 138/76   Pulse 67   Ht 5\' 6"  (1.676 m)   Wt 189 lb (85.7 kg)   SpO2 98%   BMI 30.51 kg/m     Wt Readings from Last 3 Encounters:  01/05/19 189 lb (85.7 kg)  11/10/18 198 lb (89.8 kg)  11/03/18 190 lb (86.2 kg)     GEN: Patient is in no acute distress HEENT: Normal NECK: No JVD; No carotid bruits LYMPHATICS: No lymphadenopathy CARDIAC: Hear sounds regular, 2/6 systolic murmur at the apex. RESPIRATORY:  Clear to auscultation without rales, wheezing or rhonchi  ABDOMEN: Soft, non-tender, non-distended MUSCULOSKELETAL:  No edema; No deformity  SKIN: Warm and dry NEUROLOGIC:  Alert and oriented x 3 PSYCHIATRIC:  Normal affect   Signed, Jenean Lindau, MD  01/05/2019 9:28 AM    [place]

## 2019-01-10 ENCOUNTER — Ambulatory Visit: Payer: Medicare Other | Admitting: Cardiology

## 2019-02-14 ENCOUNTER — Other Ambulatory Visit: Payer: Medicare Other | Admitting: Nurse Practitioner

## 2019-02-28 ENCOUNTER — Other Ambulatory Visit: Payer: Self-pay | Admitting: Nurse Practitioner

## 2019-02-28 ENCOUNTER — Other Ambulatory Visit: Payer: Self-pay | Admitting: Neurology

## 2019-02-28 DIAGNOSIS — Z8673 Personal history of transient ischemic attack (TIA), and cerebral infarction without residual deficits: Secondary | ICD-10-CM

## 2019-02-28 DIAGNOSIS — K2100 Gastro-esophageal reflux disease with esophagitis, without bleeding: Secondary | ICD-10-CM

## 2019-02-28 DIAGNOSIS — I10 Essential (primary) hypertension: Secondary | ICD-10-CM

## 2019-02-28 DIAGNOSIS — E785 Hyperlipidemia, unspecified: Secondary | ICD-10-CM

## 2019-03-01 ENCOUNTER — Other Ambulatory Visit: Payer: Self-pay | Admitting: Family Medicine

## 2019-03-01 DIAGNOSIS — Z8673 Personal history of transient ischemic attack (TIA), and cerebral infarction without residual deficits: Secondary | ICD-10-CM

## 2019-03-01 NOTE — Telephone Encounter (Signed)
venlafaxine XR (EFFEXOR-XR) 75 MG 24 hr capsule  Last refill--04/22/18 OV----11/01/18  Please advise

## 2019-03-02 ENCOUNTER — Encounter: Payer: Self-pay | Admitting: *Deleted

## 2019-03-02 NOTE — Progress Notes (Signed)
Tylia Safi [id number] - [place] Case [id number] - Rx #[id number] Need help? Call us at [phone] Outcome Approvedtoday Request Reference Number: [id number]. EMGALITY INJ 120MG /ML is approved through 06/02/2019. For further questions, call [phone]. Drug Emgality 120MG /ML auto-injectors (migraine) Form [employer] Medicare Part D Electronic Prior Authorization Form [phone] NCPDP) Original Claim Info [place] [address] [place] Pay[place], [place] [address] Hours of Operations: Address: 5 a.m. - 10 p.m. PT, Monday-Friday [id number] 6 a.m. - 3 p.m. PT, [place] [address] Date: 03/02/2019 To: Metta Clines From: [employer] Phone: [phone] Phone: [phone] Fax: [phone] Reference #: [id number] RE: Prior Authorization Request Patient Name: Brandy Roberts Patient DOB: 1971/01/28 Patient ID: [id number] Status of Request: Approve Medication Name: Emgality Inj 120mg /Ml GPI/NDC: [id number] Decision Notes: EMGALITY INJ 120MG /ML, use as directed, is approved through 06/02/2019 under your Medicare Part D benefit. Reviewed by: System If the treating physician would like to discuss this coverage decision with the physician or health care professional reviewer, please call [employer] Prior Authorization department at [phone].

## 2019-03-29 ENCOUNTER — Other Ambulatory Visit: Payer: Medicare Other | Admitting: Nurse Practitioner

## 2019-04-11 ENCOUNTER — Other Ambulatory Visit: Payer: Self-pay | Admitting: Family Medicine

## 2019-04-11 ENCOUNTER — Other Ambulatory Visit: Payer: Self-pay | Admitting: Nurse Practitioner

## 2019-04-11 DIAGNOSIS — Z8673 Personal history of transient ischemic attack (TIA), and cerebral infarction without residual deficits: Secondary | ICD-10-CM

## 2019-04-11 DIAGNOSIS — I10 Essential (primary) hypertension: Secondary | ICD-10-CM

## 2019-04-11 DIAGNOSIS — G629 Polyneuropathy, unspecified: Secondary | ICD-10-CM

## 2019-04-11 DIAGNOSIS — E785 Hyperlipidemia, unspecified: Secondary | ICD-10-CM

## 2019-05-03 NOTE — Progress Notes (Signed)
Virtual Visit via Video Note The purpose of this virtual visit is to provide medical care while limiting exposure to the novel coronavirus.    Consent was obtained for video visit:  Yes.   Answered questions that patient had about telehealth interaction:  Yes.   I discussed the limitations, risks, security and privacy concerns of performing an evaluation and management service by telemedicine. I also discussed with the patient that there may be a patient responsible charge related to this service. The patient expressed understanding and agreed to proceed.  Pt location: Home Physician Location: office Name of referring provider:  [name], NP I connected with Brandy Roberts at patients initiation/request on 05/04/2019 at 10:50 AM EST by video enabled telemedicine application and verified that I am speaking with the correct person using two identifiers. Pt MRN:  [id number] Pt DOB:  1971-04-04 Video Participants:  Brandy Roberts   History of Present Illness:  Brandy Roberts is a 47 year old right-handed woman with aortic insufficiency, hypertension, hyperlipidemia and history of stroke who follows up for headache and syncope.  UPDATE: Last visit, she was tapered off of topiramate to limit polypharmacy. Intensity: Moderate Duration: 2 to 3 hours Frequency: 2 days a month Current NSAIDS:Flurbiprofen 100mg  Current analgesics:None Current triptans:None Current ergotamine:None Current anti-emetic:Zofran 4 mg, Reglan 10 mg Current muscle relaxants:None Current anti-anxiolytic:None Current sleep aide:trazodone Current Antihypertensive medications:Amlodipine10mg , hydralazine, Spironolactone, lisinopril, Lopressor Current Antidepressant medications:Venlafaxine XR 225 mg Current Anticonvulsant medications: gabapentin 300 mg twice daily Current anti-CGRP:Emgality Current Vitamins/Herbal/Supplements:None Current Antihistamines/Decongestants:None Other  therapy:None Hormone/birth control: None  Caffeine:Coffee, sweet tea, not daily Alcohol:No Smoker:Cigarettes Diet:Hydrates Exercise:No Depression:Yes; Anxiety:Yes Other pain:Chronic right knee pain Sleep hygiene:Sometimes tosses and turns  In May, she endorsed increased problems with balance and falls. She stated that they were associated with headache and worsening of her left-sided weakness. MRI of the brain without contrast from 11/13/2017 was personally reviewed and demonstrated no new intracranial abnormalities.  Since Friday, she reports stabbing and throbbing pain and numbness in her right hand, mostly the thumb but sometimes involves the other fingers.  It is a constant pain.  No neck, wrist or radicular pain. It is worse at night time. No preceding injury.     HISTORY: MIGRAINES Onset: Migraines since she was young, but worse since her stroke in May 2016. Location: holocephalic Quality: pounding Initial Intensity: 10/10 Aura: no Prodrome: no Postdrome: no Associated symptoms: Photophobia, phonophobia. Rarely nausea. She has not had any new worse headache of her life, waking up from sleep Initial Duration: 2 days (but Fioricet and ibuprofen lowers intensity down to 2-3/10) Initial Frequency: Once or twice a month Frequency of abortive medication: only as needed Triggers: None Relieving factors: Laying down. Butalbital, ibuprofen. Tramadol helped but she was told not to take it. Activity: aggravates  Past NSAIDS: Ibuprofen, naproxen Past analgesics: Tylenol, Excedrin, Tramadol (effectivebut GI upset), Fioricet Past abortive triptans: no Past muscle relaxants: no Past anti-emetic: no Past antihypertensive medications: no Past antidepressant medications: no Past anticonvulsant medications: topiramate Past vitamins/Herbal/Supplements: no Other past therapies: no  Family history of headache: Sister.  STROKE: She had a stroke  in May 2016 with left sided weakness. MRI of brain from 10/06/14 was personally reviewed and revealed small acute lacunar infarcts in the right thalamus and right corona radiata, as well as chronic lacunar infarcts in the left hemisphere. CTA of head and neck revealed diffuse bilateral petrous and cavernous carotid stenosis. Cardiac source of embolus was not discovered.   She was admitted  to [facility] from 10/24/16 to 10/26/16 for stroke-like event. She suddenly developed left sided weakness and slurred speech with associated headache. Blood pressure in ED was 120/66. CT of head was personally reviewed and negative for acute abnormality. She had refused tPA. MRI of brain was personally reviewed and revealed no acute stroke or bleed. MRA of head revealed no significant intracranial stenosis or occlusion. LDL was 84. Hgb A1c was 5.7. As per neurology evaluation, her deficits appeared to be non-organic, with unusual speech pattern and giveaway weakness. Somatization was suspected. 2D echo from 11/06/16 demonstrated normal LV EF of 65-70% with no cardiac source of emboli. Atorvastatin was increased from 10mg  to 80mg  daily. Differential includes TIA vs hemiplegic migraine vs somatization.  SYNCOPE: On 09/21/18, she presented to the ED at [facility] for a 5 day intractable holocephalic throbbing headache. She reports that when EMS was leading her to the ambulance from her bedroom, she passed out for 20-30 seconds. She described sensation of lightheadedness, like she was going to pass out, as well as tunnel vision and palpitations. She was afebrile. CT of head revealed her old left basal ganglia infarct but no acute abnormality. She was treated with Reglan and Benadryl and headache reportedly resolved. On 09/26/18, she was sitting on her barstool when she felt lightheaded again. She noted palpitations as well. She got up and started walking to her bedroom when she noted tunnel vision and  passed out. She woke up on the floor and bruised her back and leg. She thinks she may have been unconsciousness for 20 to 30 minute. There were no witnesses. She noted urinary incontinence but did not bite her tongue. She did not have a headache. She denies change in medications over the past week. She denies fever or illness. Due to episodes of recurrent syncopal events, she underwent workup.  MRI of brain with and without contrast on 10/11/18 was personally reviewed and demonstrated stable chronic infarcts but no acute intracranial abnormality.  EEG performed on 10/21/18 was normal. No recurrent spells.  Past Medical History: Past Medical History:  Diagnosis Date  . Anemia   . Anxiety   . Chronic ischemic vertebrobasilar artery thalamic stroke   . Depression   . Hyperlipidemia   . Hypertension   . Migraines   . Nonrheumatic aortic (valve) insufficiency   . Osteoarthritis   . Sleep apnea   . Stroke ([facility] [place]) 10/25/2016    Medications: Outpatient Encounter Medications as of 05/04/2019  Medication Sig  . amLODipine (NORVASC) 10 MG tablet TAKE 1 TABLET BY MOUTH EVERY DAY  . atorvastatin (LIPITOR) 80 MG tablet TAKE 1 TABLET BY MOUTH EVERY DAY ATA6PM FOR CHOLESTEROL  . clopidogrel (PLAVIX) 75 MG tablet TAKE 1 TABLET BY MOUTH EVERY DAY  . EMGALITY 120 MG/ML SOAJ Inject 130 mg into the skin every 30 (thirty) days.  . flurbiprofen (ANSAID) 100 MG tablet 1 tablet every 8 hours, maximum 3 tablets in 24h  . gabapentin (NEURONTIN) 300 MG capsule TAKE 1 CAPSULE BY MOUTH 2 TIMES DAILY  . hydrALAZINE (APRESOLINE) 50 MG tablet TAKE 1 TABLET BY MOUTH 3 TIMES DAILY WITH MEALS  . lisinopril (ZESTRIL) 40 MG tablet TAKE 1 TABLET BY MOUTH EVERY DAY  . metoCLOPramide (REGLAN) 10 MG tablet Take 1 tablet (10 mg total) by mouth every 6 (six) hours as needed for nausea (and headache).  . metoprolol tartrate (LOPRESSOR) 50 MG tablet Take 25 mg by mouth 2 (two) times daily.  . Misc. Devices MISC Please provide  patient with insurance approved blood pressure monitor.  . nitroGLYCERIN (NITROSTAT) 0.4 MG SL tablet Place 1 tablet (0.4 mg total) under the tongue every 5 (five) minutes as needed for chest pain.  . pantoprazole (PROTONIX) 20 MG tablet TAKE 1 TABLET BY MOUTH EVERY DAY  . spironolactone (ALDACTONE) 25 MG tablet Take 1 tablet (25 mg total) by mouth daily. Must keep upcoming office visit for refills  . traZODone (DESYREL) 100 MG tablet Take 1 tablet (100 mg total) by mouth at bedtime.  [name] venlafaxine XR (EFFEXOR-XR) 75 MG 24 hr capsule TAKE 3 CAPSULES(225MG  TOTAL) BY MOUTH DAILY   No facility-administered encounter medications on file as of 05/04/2019.     Allergies: Allergies  Allergen Reactions  . Ibuprofen     Can not take with current medications  . Tylenol [Acetaminophen] Other (See Comments)    Pt stated tylenol gives her extreme headache    Family History: Family History  Problem Relation Age of Onset  . Cancer Mother        type unknown  . Hypertension Mother   . Hypertension Sister   . Diabetes Sister   . Cancer Maternal Aunt        4 aunts died of cancer types unknown    Social History: Social History   Socioeconomic History  . Marital status: Single    Spouse name: Not on file  . Number of children: 2  . Years of education: 45  . Highest education level: Not on file  Occupational History  . Occupation: N/A  Social Needs  . Financial resource strain: Not on file  . Food insecurity    Worry: Not on file    Inability: Not on file  . Transportation needs    Medical: Not on file    Non-medical: Not on file  Tobacco Use  . Smoking status: Former Smoker    Packs/day: 0.25    Quit date: 08/31/2017    Years since quitting: 1.6  . Smokeless tobacco: Never Used  . Tobacco comment: Pt. still smoke marijuana.   Substance and Sexual Activity  . Alcohol use: No    Alcohol/week: 0.0 standard drinks  . Drug use: Yes    Types: Marijuana  . Sexual activity: Yes     Birth control/protection: Post-menopausal  Lifestyle  . Physical activity    Days per week: Not on file    Minutes per session: Not on file  . Stress: Not on file  Relationships  . Social Herbalist on phone: Not on file    Gets together: Not on file    Attends religious service: Not on file    Active member of club or organization: Not on file    Attends meetings of clubs or organizations: Not on file    Relationship status: Not on file  . Intimate partner violence    Fear of current or ex partner: Not on file    Emotionally abused: Not on file    Physically abused: Not on file    Forced sexual activity: Not on file  Other Topics Concern  . Not on file  Social History Narrative   Patient lives with her uncle in a one story home.  Has 2 sons.  Currently on disability.  Education: high school.   Drinks 1-2 sodas a week     Observations/Objective:   Height 5\' 6"  (1.676 m), weight 200 lb (90.7 kg). No acute distress.  Alert and oriented.  Speech fluent  and not dysarthric.  Language intact.  Eyes orthophoric on primary gaze.  Face symmetric.  Assessment and Plan:   1.  Migraine without aura, without status migrainosus, not intractable 2.  Recurrent loss of consciousness.  Semiology characteristic of syncope.  May be due to orthostatic hypotension, vasovagal or cardiac source.  The first event occurred in setting of severe migraine, which may have been vasovagal.  MRI of brain and EEG unremarkable. 3.  Left-sided hemiplegia as late effect of stroke 4.  Right hand/thumb pain and numbness.  Possibly carpal tunnel syndrome.  It also may be a tendonitis.  1.  Emgality for migraine prophylaxis. 2.  Flurbiprofen 100 mg for migraine abortive therapy. 3.  Limit use of pain relievers to no more than 2 days out of the week to prevent risk of rebound headache or medication overuse headache. 3.  Secondary stroke prevention: Plavix, atorvastatin, blood pressure control 4.  Advised to  wear a wrist splint.  If symptoms do not improve in 4 weeks, she is to contact me and we can schedule NCV-EMG of right upper extremity.  Follow Up Instructions:    -I discussed the assessment and treatment plan with the patient. The patient was provided an opportunity to ask questions and all were answered. The patient agreed with the plan and demonstrated an understanding of the instructions.   The patient was advised to call back or seek an in-person evaluation if the symptoms worsen or if the condition fails to improve as anticipated.   Dudley Major, DO

## 2019-05-04 ENCOUNTER — Other Ambulatory Visit: Payer: Self-pay | Admitting: Family Medicine

## 2019-05-04 ENCOUNTER — Other Ambulatory Visit: Payer: Self-pay

## 2019-05-04 ENCOUNTER — Other Ambulatory Visit: Payer: Self-pay | Admitting: Nurse Practitioner

## 2019-05-04 ENCOUNTER — Ambulatory Visit: Payer: Medicare Other | Attending: Nurse Practitioner | Admitting: Physician Assistant

## 2019-05-04 ENCOUNTER — Telehealth (INDEPENDENT_AMBULATORY_CARE_PROVIDER_SITE_OTHER): Payer: Medicare Other | Admitting: Neurology

## 2019-05-04 ENCOUNTER — Telehealth: Payer: Self-pay

## 2019-05-04 ENCOUNTER — Telehealth: Payer: Self-pay | Admitting: Neurology

## 2019-05-04 ENCOUNTER — Encounter: Payer: Self-pay | Admitting: Neurology

## 2019-05-04 VITALS — Ht 66.0 in | Wt 200.0 lb

## 2019-05-04 DIAGNOSIS — M79644 Pain in right finger(s): Secondary | ICD-10-CM | POA: Diagnosis not present

## 2019-05-04 DIAGNOSIS — I10 Essential (primary) hypertension: Secondary | ICD-10-CM

## 2019-05-04 DIAGNOSIS — K2100 Gastro-esophageal reflux disease with esophagitis, without bleeding: Secondary | ICD-10-CM

## 2019-05-04 DIAGNOSIS — G43009 Migraine without aura, not intractable, without status migrainosus: Secondary | ICD-10-CM | POA: Diagnosis not present

## 2019-05-04 DIAGNOSIS — E785 Hyperlipidemia, unspecified: Secondary | ICD-10-CM

## 2019-05-04 DIAGNOSIS — M79641 Pain in right hand: Secondary | ICD-10-CM

## 2019-05-04 DIAGNOSIS — G8194 Hemiplegia, unspecified affecting left nondominant side: Secondary | ICD-10-CM

## 2019-05-04 DIAGNOSIS — Z8673 Personal history of transient ischemic attack (TIA), and cerebral infarction without residual deficits: Secondary | ICD-10-CM

## 2019-05-04 DIAGNOSIS — R55 Syncope and collapse: Secondary | ICD-10-CM

## 2019-05-04 MED ORDER — WRIST/THUMB SPLINT/RIGHT MED MISC: 1.0000 | Freq: Every day | 0 refills | Status: DC

## 2019-05-04 NOTE — Telephone Encounter (Signed)
CMA faxed over supplies to [facility].  Supplies: Right medium splint

## 2019-05-04 NOTE — Telephone Encounter (Signed)
Patient would like to speak to someone about where she can get a brace for her hand. Please call

## 2019-05-04 NOTE — Telephone Encounter (Signed)
Spoke with patient she was informed of  Medical supply store. She requesting that someone put in orders to have it delivered to her. She was informed to contact pcp for orders for brace. She has appt with PCP next week. She will contact her pcp office for orders

## 2019-05-04 NOTE — Progress Notes (Signed)
Virtual Visit via Telephone Note  I connected with Brandy Roberts on 05/04/19 at  1:30 PM EST by telephone and verified that I am speaking with the correct person using two identifiers.   I discussed the limitations, risks, security and privacy concerns of performing an evaluation and management service by telephone and the availability of in person appointments. I also discussed with the patient that there may be a patient responsible charge related to this service. The patient expressed understanding and agreed to proceed.  Patient location:  home My Location:  [facility] office Persons on the call: me and the patient.     History of Present Illness:  R thumb pain for 5 days.  Requesting  A splint through home health to be delivered to her house. No fever.  No redness.  NKI.  No fever.  .   Observations/Objective: NAD.  A&Ox3   Assessment and Plan: 1. Thumb pain, right Already on pain meds.  Will coordinate R thumb spica splint with Bien through home health for delivery.      Follow Up Instructions: See PCP in December as already scheduled   I discussed the assessment and treatment plan with the patient. The patient was provided an opportunity to ask questions and all were answered. The patient agreed with the plan and demonstrated an understanding of the instructions.   The patient was advised to call back or seek an in-person evaluation if the symptoms worsen or if the condition fails to improve as anticipated.  I provided 9 minutes of non-face-to-face time during this encounter.   Freeman Caldron, PA-C  Patient ID: Brandy Roberts, female   DOB: [birth year], 48 y.o.   MRN: [id number]

## 2019-05-17 ENCOUNTER — Other Ambulatory Visit: Payer: Self-pay

## 2019-05-17 ENCOUNTER — Other Ambulatory Visit (HOSPITAL_COMMUNITY)
Admission: RE | Admit: 2019-05-17 | Discharge: 2019-05-17 | Disposition: A | Discharge status: Home / Self Care | Payer: Medicare Other | Source: Ambulatory Visit | Attending: Nurse Practitioner | Admitting: Nurse Practitioner

## 2019-05-17 ENCOUNTER — Encounter: Payer: Self-pay | Admitting: Nurse Practitioner

## 2019-05-17 ENCOUNTER — Ambulatory Visit (HOSPITAL_BASED_OUTPATIENT_CLINIC_OR_DEPARTMENT_OTHER): Payer: Medicare Other | Admitting: Nurse Practitioner

## 2019-05-17 VITALS — BP 113/65 | HR 60 | Temp 98.3°F | Ht 66.0 in | Wt 198.0 lb

## 2019-05-17 DIAGNOSIS — K2100 Gastro-esophageal reflux disease with esophagitis, without bleeding: Secondary | ICD-10-CM

## 2019-05-17 DIAGNOSIS — B9689 Other specified bacterial agents as the cause of diseases classified elsewhere: Secondary | ICD-10-CM

## 2019-05-17 DIAGNOSIS — L0889 Other specified local infections of the skin and subcutaneous tissue: Secondary | ICD-10-CM

## 2019-05-17 DIAGNOSIS — Z124 Encounter for screening for malignant neoplasm of cervix: Secondary | ICD-10-CM

## 2019-05-17 DIAGNOSIS — Z1151 Encounter for screening for human papillomavirus (HPV): Secondary | ICD-10-CM | POA: Insufficient documentation

## 2019-05-17 DIAGNOSIS — R21 Rash and other nonspecific skin eruption: Secondary | ICD-10-CM | POA: Diagnosis not present

## 2019-05-17 DIAGNOSIS — R7303 Prediabetes: Secondary | ICD-10-CM

## 2019-05-17 DIAGNOSIS — F17200 Nicotine dependence, unspecified, uncomplicated: Secondary | ICD-10-CM

## 2019-05-17 DIAGNOSIS — I10 Essential (primary) hypertension: Secondary | ICD-10-CM | POA: Diagnosis not present

## 2019-05-17 DIAGNOSIS — F17210 Nicotine dependence, cigarettes, uncomplicated: Secondary | ICD-10-CM

## 2019-05-17 DIAGNOSIS — L089 Local infection of the skin and subcutaneous tissue, unspecified: Secondary | ICD-10-CM

## 2019-05-17 DIAGNOSIS — M79644 Pain in right finger(s): Secondary | ICD-10-CM

## 2019-05-17 DIAGNOSIS — L298 Other pruritus: Secondary | ICD-10-CM

## 2019-05-17 MED ORDER — PANTOPRAZOLE SODIUM 20 MG PO TBEC: 20.0000 mg | DELAYED_RELEASE_TABLET | Freq: Every day | ORAL | 1 refills | Status: DC

## 2019-05-17 MED ORDER — MUPIROCIN 2 % EX OINT: 1.0000 "application " | TOPICAL_OINTMENT | Freq: Two times a day (BID) | CUTANEOUS | 1 refills | Status: DC

## 2019-05-17 MED ORDER — SULFAMETHOXAZOLE-TRIMETHOPRIM 400-80 MG PO TABS: 2.0000 | ORAL_TABLET | Freq: Two times a day (BID) | ORAL | 0 refills | Status: AC

## 2019-05-17 NOTE — Progress Notes (Signed)
 Assessment & Plan:  Brandy Roberts was seen today for gynecologic exam.  Diagnoses and all orders for this visit:  Encounter for Papanicolaou smear for cervical cancer screening -     WET PREP -     PAP WITH EVERYTHING  Gastroesophageal reflux disease with esophagitis -     pantoprazole (PROTONIX) 20 MG tablet; Take 1 tablet (20 mg total) by mouth daily. INSTRUCTIONS: Avoid GERD Triggers: acidic, spicy or fried foods, caffeine, coffee, sodas,  alcohol and chocolate.   Essential hypertension -     CMP14+EGFR Continue all antihypertensives as prescribed.  Remember to bring in your blood pressure log with you for your follow up appointment.  DASH/Mediterranean Diets are healthier choices for HTN.    Prediabetes -     A1c  Tobacco dependence Brandy Roberts was counseled on the dangers of tobacco use, and was advised to quit. Reviewed strategies to maximize success, including removing cigarettes and smoking materials from environment, stress management and support of family/friends as well as pharmacological alternatives including: Wellbutrin, Chantix, Nicotine patch, Nicotine gum or lozenges. Smoking cessation support: smoking cessation hotline: 1-800-QUIT-NOW.  Smoking cessation classes are also available through [facility]. Call [phone] or visit our website at [email or web address].   A total of 3 minutes was spent on counseling for smoking cessation and Brandy Roberts is not ready to quit.   Bacterial skin infection -     sulfamethoxazole-trimethoprim (BACTRIM) 400-80 MG tablet; Take 2 tablets by mouth 2 (two) times daily for 7 days. -     mupirocin ointment (BACTROBAN) 2 %; Apply 1 application topically 2 (two) times daily.    Patient has been counseled on age-appropriate routine health concerns for screening and prevention. These are reviewed and up-to-date. Referrals have been placed accordingly. Immunizations are up-to-date or declined.    Subjective:   Chief  Complaint  Patient presents with  . Gynecologic Exam    Pt. is here for pap smear.   HPI Brandy Roberts 48 y.o. female presents to office today for PAP Smear.  Review of Systems  Constitutional: Negative.  Negative for chills, fever, malaise/fatigue and weight loss.  Respiratory: Negative.  Negative for cough, shortness of breath and wheezing.   Cardiovascular: Negative.  Negative for chest pain, orthopnea and leg swelling.  Gastrointestinal: Negative for abdominal pain.  Genitourinary: Negative.  Negative for flank pain.  Skin: Positive for itching and rash.  Psychiatric/Behavioral: Negative for suicidal ideas.    Past Medical History:  Diagnosis Date  . Anemia   . Anxiety   . Chronic ischemic vertebrobasilar artery thalamic stroke   . Depression   . Hyperlipidemia   . Hypertension   . Migraines   . Nonrheumatic aortic (valve) insufficiency   . Osteoarthritis   . Sleep apnea   . Stroke [name] 10/25/2016    Past Surgical History:  Procedure Laterality Date  . CESAREAN SECTION    . GANGLION CYST EXCISION    . TEE WITHOUT CARDIOVERSION N/A 10/09/2014   Procedure: TRANSESOPHAGEAL ECHOCARDIOGRAM (TEE);  Surgeon: Mihai Croitoru, MD;  Location: [facility] ENDOSCOPY;  Service: Cardiovascular;  Laterality: N/A;  . TONSILLECTOMY      Family History  Problem Relation Age of Onset  . Cancer Mother        type unknown  . Hypertension Mother   . Hypertension Sister   . Diabetes Sister   . Cancer Maternal Aunt        4 aunts died of cancer types unknown    Social   History Reviewed with no changes to be made today.   Outpatient Medications Prior to Visit  Medication Sig Dispense Refill  . amLODipine (NORVASC) 10 MG tablet TAKE 1 TABLET BY MOUTH EVERY DAY 30 tablet 0  . clopidogrel (PLAVIX) 75 MG tablet TAKE 1 TABLET BY MOUTH EVERY DAY 30 tablet 0  . Elastic Bandages & Supports (WRIST/THUMB SPLINT/RIGHT MED) MISC 1 each by Does not apply route daily. 1 each 0  . EMGALITY 120 MG/ML  SOAJ Inject 130 mg into the skin every 30 (thirty) days.    . flurbiprofen (ANSAID) 100 MG tablet 1 tablet every 8 hours, maximum 3 tablets in 24h 24 tablet 3  . gabapentin (NEURONTIN) 300 MG capsule TAKE 1 CAPSULE BY MOUTH 2 TIMES DAILY 60 capsule 2  . hydrALAZINE (APRESOLINE) 50 MG tablet TAKE 1 TABLET BY MOUTH 3 TIMES DAILY WITH MEALS 30 tablet 0  . metoCLOPramide (REGLAN) 10 MG tablet Take 1 tablet (10 mg total) by mouth every 6 (six) hours as needed for nausea (and headache). 30 tablet 2  . metoprolol tartrate (LOPRESSOR) 50 MG tablet Take 25 mg by mouth 2 (two) times daily.    . Misc. Devices MISC Please provide patient with insurance approved blood pressure monitor. 1 each 0  . spironolactone (ALDACTONE) 25 MG tablet TAKE 1 TABLET BY MOUTH EVERY DAY 30 tablet 0  . traZODone (DESYREL) 100 MG tablet Take 1 tablet (100 mg total) by mouth at bedtime. 90 tablet 1  . venlafaxine XR (EFFEXOR-XR) 75 MG 24 hr capsule TAKE 3 CAPSULES(225MG TOTAL) BY MOUTH DAILY 90 capsule 3  . pantoprazole (PROTONIX) 20 MG tablet TAKE 1 TABLET BY MOUTH EVERY DAY 30 tablet 0  . atorvastatin (LIPITOR) 80 MG tablet TAKE 1 TABLET BY MOUTH EVERY DAY ATA6PM FOR CHOLESTEROL 30 tablet 0  . lisinopril (ZESTRIL) 40 MG tablet TAKE 1 TABLET BY MOUTH EVERY DAY 30 tablet 0  . nitroGLYCERIN (NITROSTAT) 0.4 MG SL tablet Place 1 tablet (0.4 mg total) under the tongue every 5 (five) minutes as needed for chest pain. 90 tablet 3   No facility-administered medications prior to visit.    Allergies  Allergen Reactions  . Ibuprofen     Can not take with current medications  . Tylenol [Acetaminophen] Other (See Comments)    Pt stated tylenol gives her extreme headache       Objective:    BP 113/65 (BP Location: Right Arm, Patient Position: Sitting, Cuff Size: Large)   Pulse 60   Temp 98.3 F (36.8 C) (Oral)   Ht 5' 6" (1.676 m)   Wt 198 lb (89.8 kg)   SpO2 97%   BMI 31.96 kg/m  Wt Readings from Last 3 Encounters:    05/17/19 198 lb (89.8 kg)  05/04/19 200 lb (90.7 kg)  01/05/19 189 lb (85.7 kg)    Physical Exam Exam conducted with a chaperone present.  Constitutional:      Appearance: She is well-developed.  HENT:     Head: Normocephalic.  Cardiovascular:     Rate and Rhythm: Normal rate and regular rhythm.     Heart sounds: Normal heart sounds.  Pulmonary:     Effort: Pulmonary effort is normal.     Breath sounds: Normal breath sounds.  Abdominal:     General: Bowel sounds are normal.     Palpations: Abdomen is soft.     Hernia: There is no hernia in the left inguinal area.  Genitourinary:    Exam position: Lithotomy  position.     Labia:        Right: No rash, tenderness, lesion or injury.        Left: No rash, tenderness, lesion or injury.      Vagina: Normal. No signs of injury and foreign body. No vaginal discharge, erythema, tenderness or bleeding.     Cervix: No cervical motion tenderness or friability.     Uterus: Not deviated and not enlarged.      Adnexa:        Right: No mass, tenderness or fullness.         Left: No mass, tenderness or fullness.       Rectum: Normal. No external hemorrhoid.  Lymphadenopathy:     Lower Body: No right inguinal adenopathy. No left inguinal adenopathy.  Skin:    General: Skin is warm and dry.     Findings: Lesion and rash present. Rash is macular.          Comments: Numerous hyperpigmented lesions. Several of the lesions are excoriated. Denies states the lesions are chronic.   Neurological:     Mental Status: She is alert and oriented to person, place, and time.  Psychiatric:        Behavior: Behavior normal.        Thought Content: Thought content normal.        Judgment: Judgment normal.          Patient has been counseled extensively about nutrition and exercise as well as the importance of adherence with medications and regular follow-up. The patient was given clear instructions to go to ER or return to medical center if symptoms  don't improve, worsen or new problems develop. The patient verbalized understanding.   Follow-up: Return in about 4 weeks (around 06/14/2019) for TELE SKIN INFECTION.   Brandy W Fleming, FNP-BC [facility] [place], [place] [phone]   05/29/2019, 7:24 PM 

## 2019-05-18 ENCOUNTER — Other Ambulatory Visit: Payer: Self-pay | Admitting: Nurse Practitioner

## 2019-05-18 LAB — CERVICOVAGINAL ANCILLARY ONLY
Bacterial Vaginitis (gardnerella): POSITIVE — AB
Candida Glabrata: NEGATIVE
Candida Vaginitis: NEGATIVE
Chlamydia: NEGATIVE
Comment: NEGATIVE
Comment: NEGATIVE
Comment: NEGATIVE
Comment: NEGATIVE
Comment: NEGATIVE
Comment: NORMAL
Neisseria Gonorrhea: NEGATIVE
Trichomonas: NEGATIVE

## 2019-05-18 LAB — CMP14+EGFR
ALT: 25 IU/L (ref 0–32)
AST: 24 IU/L (ref 0–40)
Albumin/Globulin Ratio: 1.4 (ref 1.2–2.2)
Albumin: 4.2 g/dL (ref 3.8–4.8)
Alkaline Phosphatase: 111 IU/L (ref 39–117)
BUN/Creatinine Ratio: 17 (ref 9–23)
BUN: 18 mg/dL (ref 6–24)
Bilirubin Total: 0.3 mg/dL (ref 0.0–1.2)
CO2: 22 mmol/L (ref 20–29)
Calcium: 9.2 mg/dL (ref 8.7–10.2)
Chloride: 107 mmol/L — ABNORMAL HIGH (ref 96–106)
Creatinine, Ser: 1.04 mg/dL — ABNORMAL HIGH (ref 0.57–1.00)
GFR calc Af Amer: 73 mL/min/{1.73_m2} (ref >59)
GFR calc non Af Amer: 63 mL/min/{1.73_m2} (ref >59)
Globulin, Total: 2.9 g/dL (ref 1.5–4.5)
Glucose: 94 mg/dL (ref 65–99)
Potassium: 4.1 mmol/L (ref 3.5–5.2)
Sodium: 142 mmol/L (ref 134–144)
Total Protein: 7.1 g/dL (ref 6.0–8.5)

## 2019-05-18 LAB — HEMOGLOBIN A1C
Est. average glucose Bld gHb Est-mCnc: 117 mg/dL
Hgb A1c MFr Bld: 5.7 % — ABNORMAL HIGH (ref 4.8–5.6)

## 2019-05-18 MED ORDER — METRONIDAZOLE 500 MG PO TABS: 500.0000 mg | ORAL_TABLET | Freq: Two times a day (BID) | ORAL | 0 refills | Status: AC

## 2019-05-19 LAB — CYTOLOGY - PAP
Comment: NEGATIVE
Diagnosis: NEGATIVE
High risk HPV: NEGATIVE

## 2019-05-19 NOTE — Progress Notes (Signed)
(  Key: [id number]) Emgality 120MG /ML auto-injectors (migraine)  Wait for Determination Please wait for [employer] 2017 NCPDP to return a determination.

## 2019-05-20 NOTE — Progress Notes (Signed)
received hard fax from [employer] with determination for Emgality 120mg /ml    Message from plan:  This medication or product was previously  approved on [id number] from 03/02/19 from 06/02/19 and [id number] from 06/03/19 ro 06/01/2020. You will be able to fill a prescription for this medication at you pharmacy. If your pharmacy has questions regarding the processing of your prescriptions, please have them call the [employer] pharmacy help desk at [phone]

## 2019-05-29 ENCOUNTER — Encounter: Payer: Self-pay | Admitting: Nurse Practitioner

## 2019-06-17 ENCOUNTER — Encounter: Payer: Self-pay | Admitting: Nurse Practitioner

## 2019-06-17 ENCOUNTER — Ambulatory Visit: Payer: Medicare Other | Attending: Nurse Practitioner | Admitting: Nurse Practitioner

## 2019-06-17 ENCOUNTER — Other Ambulatory Visit: Payer: Self-pay

## 2019-06-17 DIAGNOSIS — L0889 Other specified local infections of the skin and subcutaneous tissue: Secondary | ICD-10-CM

## 2019-06-17 DIAGNOSIS — R296 Repeated falls: Secondary | ICD-10-CM | POA: Diagnosis not present

## 2019-06-17 DIAGNOSIS — I10 Essential (primary) hypertension: Secondary | ICD-10-CM

## 2019-06-17 DIAGNOSIS — F17210 Nicotine dependence, cigarettes, uncomplicated: Secondary | ICD-10-CM | POA: Diagnosis not present

## 2019-06-17 DIAGNOSIS — B9689 Other specified bacterial agents as the cause of diseases classified elsewhere: Secondary | ICD-10-CM

## 2019-06-17 DIAGNOSIS — L089 Local infection of the skin and subcutaneous tissue, unspecified: Secondary | ICD-10-CM

## 2019-06-17 DIAGNOSIS — E785 Hyperlipidemia, unspecified: Secondary | ICD-10-CM

## 2019-06-17 DIAGNOSIS — F17200 Nicotine dependence, unspecified, uncomplicated: Secondary | ICD-10-CM

## 2019-06-17 DIAGNOSIS — G629 Polyneuropathy, unspecified: Secondary | ICD-10-CM

## 2019-06-17 DIAGNOSIS — Z8673 Personal history of transient ischemic attack (TIA), and cerebral infarction without residual deficits: Secondary | ICD-10-CM

## 2019-06-17 DIAGNOSIS — Z1231 Encounter for screening mammogram for malignant neoplasm of breast: Secondary | ICD-10-CM

## 2019-06-17 MED ORDER — MUPIROCIN 2 % EX OINT: 1.0000 "application " | TOPICAL_OINTMENT | Freq: Two times a day (BID) | CUTANEOUS | 1 refills | Status: DC

## 2019-06-17 NOTE — Progress Notes (Signed)
Virtual Visit via Telephone Note Due to national recommendations of social distancing due to [name], telehealth visit is felt to be most appropriate for this patient at this time.  I discussed the limitations, risks, security and privacy concerns of performing an evaluation and management service by telephone and the availability of in person appointments. I also discussed with the patient that there may be a patient responsible charge related to this service. The patient expressed understanding and agreed to proceed.    I connected with Brandy Roberts on 06/17/19  at   3:10 PM EST  EDT by telephone and verified that I am speaking with the correct person using two identifiers.   Consent I discussed the limitations, risks, security and privacy concerns of performing an evaluation and management service by telephone and the availability of in person appointments. I also discussed with the patient that there may be a patient responsible charge related to this service. The patient expressed understanding and agreed to proceed.   Location of Patient: Private Residence    Location of Provider: [place] and [place] participating in Telemedicine visit: [name] FNP-BC [place]    History of Present Illness: Telemedicine visit for: F/U    Essential Hypertension Taking amlodipine 10 mg, hydralazine 50 mg TID, lopressor 25 mg BID, spironolactone 25 mg daily.  States blood pressures at home are still running high. Most recent reading 152/60 however many of her medications are showing expired from several months ago. Recent office readings shows BP as well controlled. Denies chest pain, shortness of breath, palpitations, lightheadedness, dizziness, headaches or BLE edema.  BP Readings from Last 3 Encounters:  05/17/19 113/65  01/05/19 138/76  11/10/18 121/61    Stroke History of falls. Now endorses re occurrence of falls due to  left sided weakness. Using a cane to assist with mobility. Will refer to PT for evaluation.   Past Medical History:  Diagnosis Date  . Anemia   . Anxiety   . Chronic ischemic vertebrobasilar artery thalamic stroke   . Depression   . Hyperlipidemia   . Hypertension   . Migraines   . Nonrheumatic aortic (valve) insufficiency   . Osteoarthritis   . Sleep apnea   . Stroke [facility]) 10/25/2016    Past Surgical History:  Procedure Laterality Date  . CESAREAN SECTION    . GANGLION CYST EXCISION    . TEE WITHOUT CARDIOVERSION N/A 10/09/2014   Procedure: TRANSESOPHAGEAL ECHOCARDIOGRAM (TEE);  Surgeon: Sanda Klein, MD;  Location: [facility] ENDOSCOPY;  Service: Cardiovascular;  Laterality: N/A;  . TONSILLECTOMY      Family History  Problem Relation Age of Onset  . Cancer Mother        type unknown  . Hypertension Mother   . Hypertension Sister   . Diabetes Sister   . Cancer Maternal Aunt        4 aunts died of cancer types unknown    Social History   Socioeconomic History  . Marital status: Single    Spouse name: Not on file  . Number of children: 2  . Years of education: 5  . Highest education level: High school graduate  Occupational History  . Occupation: disable   Tobacco Use  . Smoking status: Current Some Day Smoker    Packs/day: 0.25    Last attempt to quit: 08/31/2017    Years since quitting: 1.8  . Smokeless tobacco: Never Used  . Tobacco comment: Pt. still  smoke marijuana.   Substance and Sexual Activity  . Alcohol use: No    Alcohol/week: 0.0 standard drinks  . Drug use: Yes    Types: Marijuana  . Sexual activity: Yes    Birth control/protection: Post-menopausal  Other Topics Concern  . Not on file  Social History Narrative   Patient lives with her uncle in a one story home.  Has 2 sons.  Currently on disability.  Education: high school.   Drinks 1-2 sodas a week       Social Determinants of Health   Financial Resource Strain:   . Difficulty of Paying Living  Expenses: Not on file  Food Insecurity:   . Worried About Charity fundraiser in the Last Year: Not on file  . Ran Out of Food in the Last Year: Not on file  Transportation Needs:   . Lack of Transportation (Medical): Not on file  . Lack of Transportation (Non-Medical): Not on file  Physical Activity:   . Days of Exercise per Week: Not on file  . Minutes of Exercise per Session: Not on file  Stress:   . Feeling of Stress : Not on file  Social Connections:   . Frequency of Communication with Friends and Family: Not on file  . Frequency of Social Gatherings with Friends and Family: Not on file  . Attends Religious Services: Not on file  . Active Member of Clubs or Organizations: Not on file  . Attends Archivist Meetings: Not on file  . Marital Status: Not on file     Observations/Objective: Awake, alert and oriented x 3   Review of Systems  Constitutional: Negative for fever, malaise/fatigue and weight loss.  HENT: Negative.  Negative for nosebleeds.   Eyes: Negative.  Negative for blurred vision, double vision and photophobia.  Respiratory: Negative.  Negative for cough and shortness of breath.   Cardiovascular: Negative.  Negative for chest pain, palpitations and leg swelling.  Gastrointestinal: Negative.  Negative for heartburn, nausea and vomiting.  Musculoskeletal: Negative.  Negative for myalgias.  Skin: Positive for rash (on bilateral buttocks).  Neurological: Positive for weakness. Negative for dizziness, focal weakness, seizures and headaches.  Psychiatric/Behavioral: Negative.  Negative for suicidal ideas.    Assessment and Plan: Brandy Roberts was seen today for follow-up.  Diagnoses and all orders for this visit:  Essential hypertension -     amLODipine (NORVASC) 10 MG tablet; Take 1 tablet (10 mg total) by mouth daily. -     hydrALAZINE (APRESOLINE) 50 MG tablet; Take 1 tablet (50 mg total) by mouth 3 (three) times daily. -     lisinopril (ZESTRIL) 40 MG  tablet; Take 1 tablet (40 mg total) by mouth daily. -     spironolactone (ALDACTONE) 25 MG tablet; Take 1 tablet (25 mg total) by mouth daily. -     metoprolol tartrate (LOPRESSOR) 25 MG tablet; Take 1 tablet (25 mg total) by mouth 2 (two) times daily. Continue all antihypertensives as prescribed.  Remember to bring in your blood pressure log with you for your follow up appointment.  DASH/Mediterranean Diets are healthier choices for HTN.    Bacterial skin infection -     mupirocin ointment (BACTROBAN) 2 %; Apply 1 application topically 2 (two) times daily.  Frequent falls -     Ambulatory referral to Physical Therapy  Breast cancer screening by mammogram -     Correct MAMMOGRAM; Future  Tobacco dependence Brandy Roberts was counseled on the dangers of tobacco use,  and was advised to quit. Reviewed strategies to maximize success, including removing cigarettes and smoking materials from environment, stress management and support of family/friends as well as pharmacological alternatives including: Wellbutrin, Chantix, Nicotine patch, Nicotine gum or lozenges. Smoking cessation support: smoking cessation hotline: 1-800-QUIT-NOW.  Smoking cessation classes are also available through [facility]. Call [phone] or visit our website at [email or web address]   A total of 3 minutes was spent on counseling for smoking cessation and Brandy Roberts is not ready to quit.   Dyslipidemia -     atorvastatin (LIPITOR) 80 MG tablet; Take 1 tablet (80 mg total) by mouth daily at 6 PM. INSTRUCTIONS: Work on a low fat, heart healthy diet and participate in regular aerobic exercise program by working out at least 150 minutes per week; 5 days a week-30 minutes per day. Avoid red meat/beef/steak,  fried foods. junk foods, sodas, sugary drinks, unhealthy snacking, alcohol and smoking.  Drink at least 80 oz of water per day and monitor your carbohydrate intake daily.    History of CVA (cerebrovascular  accident) without residual deficits -     clopidogrel (PLAVIX) 75 MG tablet; Take 1 tablet (75 mg total) by mouth daily.  Neuropathy -     gabapentin (NEURONTIN) 300 MG capsule; Take 1 capsule (300 mg total) by mouth 2 (two) times daily.     Follow Up Instructions Return in about 3 months (around 09/15/2019).     I discussed the assessment and treatment plan with the patient. The patient was provided an opportunity to ask questions and all were answered. The patient agreed with the plan and demonstrated an understanding of the instructions.   The patient was advised to call back or seek an in-person evaluation if the symptoms worsen or if the condition fails to improve as anticipated.  I provided 17 minutes of non-face-to-face time during this encounter including median intraservice time, reviewing previous notes, labs, imaging, medications and explaining diagnosis and management.  [name], FNP-BC

## 2019-06-19 ENCOUNTER — Encounter: Payer: Self-pay | Admitting: Nurse Practitioner

## 2019-06-19 MED ORDER — CLOPIDOGREL BISULFATE 75 MG PO TABS: 75.0000 mg | ORAL_TABLET | Freq: Every day | ORAL | 0 refills | Status: AC

## 2019-06-19 MED ORDER — ATORVASTATIN CALCIUM 80 MG PO TABS: 80.0000 mg | ORAL_TABLET | Freq: Every day | ORAL | 0 refills | Status: DC

## 2019-06-19 MED ORDER — AMLODIPINE BESYLATE 10 MG PO TABS: 10.0000 mg | ORAL_TABLET | Freq: Every day | ORAL | 0 refills | Status: DC

## 2019-06-19 MED ORDER — GABAPENTIN 300 MG PO CAPS: 300.0000 mg | ORAL_CAPSULE | Freq: Two times a day (BID) | ORAL | 0 refills | Status: DC

## 2019-06-19 MED ORDER — LISINOPRIL 40 MG PO TABS: 40.0000 mg | ORAL_TABLET | Freq: Every day | ORAL | 0 refills | Status: DC

## 2019-06-19 MED ORDER — SPIRONOLACTONE 25 MG PO TABS: 25.0000 mg | ORAL_TABLET | Freq: Every day | ORAL | 0 refills | Status: DC

## 2019-06-19 MED ORDER — HYDRALAZINE HCL 50 MG PO TABS: 50.0000 mg | ORAL_TABLET | Freq: Three times a day (TID) | ORAL | 0 refills | Status: DC

## 2019-06-19 MED ORDER — METOPROLOL TARTRATE 25 MG PO TABS: 25.0000 mg | ORAL_TABLET | Freq: Two times a day (BID) | ORAL | 0 refills | Status: DC

## 2019-06-22 ENCOUNTER — Other Ambulatory Visit: Payer: Self-pay

## 2019-06-22 ENCOUNTER — Encounter: Payer: Self-pay | Admitting: Cardiology

## 2019-06-22 ENCOUNTER — Telehealth (INDEPENDENT_AMBULATORY_CARE_PROVIDER_SITE_OTHER): Payer: Medicare Other | Admitting: Cardiology

## 2019-06-22 VITALS — BP 156/76 | HR 73 | Ht 66.0 in | Wt 198.5 lb

## 2019-06-22 DIAGNOSIS — Z87891 Personal history of nicotine dependence: Secondary | ICD-10-CM | POA: Diagnosis not present

## 2019-06-22 DIAGNOSIS — E782 Mixed hyperlipidemia: Secondary | ICD-10-CM | POA: Diagnosis not present

## 2019-06-22 DIAGNOSIS — I10 Essential (primary) hypertension: Secondary | ICD-10-CM

## 2019-06-22 DIAGNOSIS — I119 Hypertensive heart disease without heart failure: Secondary | ICD-10-CM

## 2019-06-22 DIAGNOSIS — Z1329 Encounter for screening for other suspected endocrine disorder: Secondary | ICD-10-CM

## 2019-06-22 DIAGNOSIS — I351 Nonrheumatic aortic (valve) insufficiency: Secondary | ICD-10-CM | POA: Diagnosis not present

## 2019-06-22 MED ORDER — HYDROCHLOROTHIAZIDE 12.5 MG PO CAPS: 12.5000 mg | ORAL_CAPSULE | Freq: Every day | ORAL | 3 refills | Status: DC

## 2019-06-22 NOTE — Patient Instructions (Signed)
Medication Instructions:  Your physician has recommended you make the following change in your medication:   START taking HCTZ 12.5 mg (1 tablet) once daily  *If you need a refill on your cardiac medications before your next appointment, please call your pharmacy*  Lab Work: Your physician recommends that you return FASTING for BMP, CBC, TSH, hepatic and lipid to be drawn  If you have labs (blood work) drawn today and your tests are completely normal, you will receive your results only by: [name] MyChart Message (if you have MyChart) OR . A paper copy in the mail If you have any lab test that is abnormal or we need to change your treatment, we will call you to review the results.  Testing/Procedures: NONE  Follow-Up: At [facility], you and your health needs are our priority.  As part of our continuing mission to provide you with exceptional heart care, we have created designated Provider Care Teams.  These Care Teams include your primary Cardiologist (physician) and Advanced Practice Providers (APPs -  Physician Assistants and Nurse Practitioners) who all work together to provide you with the care you need, when you need it.  Your next appointment:   3 week(s)  The format for your next appointment:   In Person  Provider:   Jyl Heinz, MD  Other Instructions Hydrochlorothiazide, HCTZ Oral Capsules or Tablets What is this medicine? HYDROCHLOROTHIAZIDE (hye droe klor oh THYE a zide) is a diuretic. It helps you make more urine and to lose salt and excess water from your body. It treats swelling from heart, kidney, or liver disease. It also treats high blood pressure. This medicine may be used for other purposes; ask your health care provider or pharmacist if you have questions. COMMON BRAND NAME(S): Esidrix, Ezide, HydroDIURIL, Microzide, Oretic, Zide What should I tell my health care provider before I take this medicine? They need to know if you have any of these  conditions:  diabetes  gout  immune system problems, like lupus  kidney disease or kidney stones  liver disease  pancreatitis  small amount of urine or difficulty passing urine  an unusual or allergic reaction to hydrochlorothiazide, sulfa drugs, other medicines, foods, dyes, or preservatives  pregnant or trying to get pregnant  breast-feeding How should I use this medicine? Take this drug by mouth. Take it as directed on the prescription label at the same time every day. You can take it with or without food. If it upsets your stomach, take it with food. Keep taking it unless your health care provider tells you to stop. Talk to your health care provider about the use of this drug in children. While it may be prescribed for children as young as newborns for selected conditions, precautions do apply. Overdosage: If you think you have taken too much of this medicine contact a poison control center or emergency room at once. NOTE: This medicine is only for you. Do not share this medicine with others. What if I miss a dose? If you miss a dose, take it as soon as you can. If it is almost time for your next dose, take only that dose. Do not take double or extra doses. What may interact with this medicine?  cholestyramine  colestipol  digoxin  dofetilide  lithium  medicines for blood pressure  medicines for diabetes  medicines that relax muscles for surgery  other diuretics  steroid medicines like prednisone or cortisone This list may not describe all possible interactions. Give your health  care provider a list of all the medicines, herbs, non-prescription drugs, or dietary supplements you use. Also tell them if you smoke, drink alcohol, or use illegal drugs. Some items may interact with your medicine. What should I watch for while using this medicine? Visit your doctor or health care professional for regular checks on your progress. Check your blood pressure as directed.  Ask your doctor or health care professional what your blood pressure should be and when you should contact him or her. Talk to your health care professional about your risk of skin cancer. You may be more at risk for skin cancer if you take this medicine. This medicine can make you more sensitive to the sun. Keep out of the sun. If you cannot avoid being in the sun, wear protective clothing and use sunscreen. Do not use sun lamps or tanning beds/booths. You may need to be on a special diet while taking this medicine. Ask your doctor. Check with your doctor or health care professional if you get an attack of severe diarrhea, nausea and vomiting, or if you sweat a lot. The loss of too much body fluid can make it dangerous for you to take this medicine. You may get drowsy or dizzy. Do not drive, use machinery, or do anything that needs mental alertness until you know how this medicine affects you. Do not stand or sit up quickly, especially if you are an older patient. This reduces the risk of dizzy or fainting spells. Alcohol may interfere with the effect of this medicine. Avoid alcoholic drinks. This medicine may increase blood sugar. Ask your healthcare provider if changes in diet or medicines are needed if you have diabetes. What side effects may I notice from receiving this medicine? Side effects that you should report to your doctor or health care professional as soon as possible:  allergic reactions such as skin rash or itching, hives, swelling of the lips, mouth, tongue, or throat  changes in vision  chest pain  eye pain  fast or irregular heartbeat  feeling faint or lightheaded, falls  gout attack  muscle pain or cramps  pain or difficulty when passing urine  pain, tingling, numbness in the hands or feet  redness, blistering, peeling or loosening of the skin, including inside the mouth   signs and symptoms of high blood sugar such as being more thirsty or hungry or having to  urinate more than normal. You may also feel very tired or have blurry vision.  unusually weak Side effects that usually do not require medical attention (report to your doctor or health care professional if they continue or are bothersome):  change in sex drive or performance  dry mouth  headache  stomach upset This list may not describe all possible side effects. Call your doctor for medical advice about side effects. You may report side effects to FDA at 1-800-FDA-1088. Where should I keep my medicine? Keep out of the reach of children and pets. Store at room temperature between 20 and 25 degrees C (68 and 77 degrees F). Protect from light and moisture. Keep the container tightly closed. Do not freeze. Throw away any unused drug after the expiration date. NOTE: This sheet is a summary. It may not cover all possible information. If you have questions about this medicine, talk to your doctor, pharmacist, or health care provider.  2020 Elsevier/Gold Standard (2019-01-20 16:52:59)

## 2019-06-22 NOTE — Progress Notes (Signed)
Virtual Visit via Telephone Note   This visit type was conducted due to national recommendations for restrictions regarding the COVID-19 Pandemic (e.g. social distancing) in an effort to limit this patient's exposure and mitigate transmission in our community.  Due to her co-morbid illnesses, this patient is at least at moderate risk for complications without adequate follow up.  This format is felt to be most appropriate for this patient at this time.  The patient did not have access to video technology/had technical difficulties with video requiring transitioning to audio format only (telephone).  All issues noted in this document were discussed and addressed.  No physical exam could be performed with this format.  Please refer to the patient's chart for her  consent to telehealth for [facility].   Date:  06/22/2019   ID:  Brandy Roberts, DOB 01/31/1971, MRN [id number]  Patient Location: Home Provider Location: Office  PCP:  [name], NP  Cardiologist:  Jenean Lindau, MD  Electrophysiologist:  None   Evaluation Performed:  Follow-Up Visit  Chief Complaint: Essential hypertension follow-up  History of Present Illness:    Brandy Roberts is a 49 y.o. female with medical history of essential hypertension diet-controlled diabetes mellitus. She was evaluated for chest discomfort and palpitations and her evaluation was unremarkable including echocardiogram stress test and monitoring. She denies any problems at this time. She has had a history of stroke leads a sedentary lifestyle for the same reason. At the time of my evaluation, the patient is alert awake oriented and in no distress.  The patient does not have symptoms concerning for COVID-19 infection (fever, chills, cough, or new shortness of breath).    Past Medical History:  Diagnosis Date  . Anemia   . Anxiety   . Chronic ischemic vertebrobasilar artery thalamic stroke   . Depression   . Hyperlipidemia   .  Hypertension   . Migraines   . Nonrheumatic aortic (valve) insufficiency   . Osteoarthritis   . Sleep apnea   . Stroke [facility]) 10/25/2016   Past Surgical History:  Procedure Laterality Date  . CESAREAN SECTION    . GANGLION CYST EXCISION    . TEE WITHOUT CARDIOVERSION N/A 10/09/2014   Procedure: TRANSESOPHAGEAL ECHOCARDIOGRAM (TEE);  Surgeon: Sanda Klein, MD;  Location: [facility] ENDOSCOPY;  Service: Cardiovascular;  Laterality: N/A;  . TONSILLECTOMY       Current Meds  Medication Sig  . amLODipine (NORVASC) 10 MG tablet Take 1 tablet (10 mg total) by mouth daily.  [name] atorvastatin (LIPITOR) 80 MG tablet Take 1 tablet (80 mg total) by mouth daily at 6 PM.  . clopidogrel (PLAVIX) 75 MG tablet Take 1 tablet (75 mg total) by mouth daily.  . Elastic Bandages & Supports (WRIST/THUMB SPLINT/RIGHT MED) MISC 1 each by Does not apply route daily.  [name] EMGALITY 120 MG/ML SOAJ Inject 130 mg into the skin every 30 (thirty) days.  . flurbiprofen (ANSAID) 100 MG tablet 1 tablet every 8 hours, maximum 3 tablets in 24h  . gabapentin (NEURONTIN) 300 MG capsule Take 1 capsule (300 mg total) by mouth 2 (two) times daily.  . hydrALAZINE (APRESOLINE) 50 MG tablet Take 1 tablet (50 mg total) by mouth 3 (three) times daily.  [name] lisinopril (ZESTRIL) 40 MG tablet Take 1 tablet (40 mg total) by mouth daily.  . metoCLOPramide (REGLAN) 10 MG tablet Take 1 tablet (10 mg total) by mouth every 6 (six) hours as needed for nausea (and headache).  . metoprolol tartrate (LOPRESSOR)  25 MG tablet Take 1 tablet (25 mg total) by mouth 2 (two) times daily.  . Misc. Devices MISC Please provide patient with insurance approved blood pressure monitor.  . mupirocin ointment (BACTROBAN) 2 % Apply 1 application topically 2 (two) times daily.  . pantoprazole (PROTONIX) 20 MG tablet Take 1 tablet (20 mg total) by mouth daily.  [name] spironolactone (ALDACTONE) 25 MG tablet Take 1 tablet (25 mg total) by mouth daily.  . traZODone (DESYREL) 100 MG  tablet Take 1 tablet (100 mg total) by mouth at bedtime.  [name] venlafaxine XR (EFFEXOR-XR) 75 MG 24 hr capsule TAKE 3 CAPSULES(225MG  TOTAL) BY MOUTH DAILY     Allergies:   Ibuprofen and Tylenol [acetaminophen]   Social History   Tobacco Use  . Smoking status: Current Some Day Smoker    Packs/day: 0.25    Last attempt to quit: 08/31/2017    Years since quitting: 1.8  . Smokeless tobacco: Never Used  . Tobacco comment: Pt. still smoke marijuana.   Substance Use Topics  . Alcohol use: No    Alcohol/week: 0.0 standard drinks  . Drug use: Yes    Types: Marijuana     Family Hx: The patient's family history includes Cancer in her maternal aunt and mother; Diabetes in her sister; Hypertension in her mother and sister.  ROS:   Please see the history of present illness.    As mentioned above All other systems reviewed and are negative.   Prior CV studies:   The following studies were reviewed today:  Echocardiogram stress test and monitoring reports were discussed with the patient  Labs/Other Tests and Data Reviewed:    EKG:  I reviewed EKG from the past visit.  Recent Labs: 09/30/2018: Hemoglobin 12.4; Platelets 248; TSH 0.995 05/17/2019: ALT 25; BUN 18; Creatinine, Ser 1.04; Potassium 4.1; Sodium 142   Recent Lipid Panel Lab Results  Component Value Date/Time   CHOL 111 12/15/2017 02:44 PM   TRIG 61 12/15/2017 02:44 PM   HDL 42 12/15/2017 02:44 PM   CHOLHDL 2.6 12/15/2017 02:44 PM   CHOLHDL 3.3 10/25/2016 02:48 AM   LDLCALC 57 12/15/2017 02:44 PM    Wt Readings from Last 3 Encounters:  06/22/19 198 lb 8 oz (90 kg)  05/17/19 198 lb (89.8 kg)  05/04/19 200 lb (90.7 kg)     Objective:    Vital Signs:  BP (!) 156/76   Pulse 73   Ht 5\' 6"  (1.676 m)   Wt 198 lb 8 oz (90 kg)   BMI 32.04 kg/m    VITAL SIGNS:  reviewed  ASSESSMENT & PLAN:    1. Essential hypertension: Primary prevention stressed to the patient. Importance of compliance with diet and medication  stressed and she vocalized understanding. Dietary restrictions including salt intake issues were discussed I initiated her on hydrochlorothiazide 12.5 mg daily and she will be back for a follow-up appointment in the next couple of weeks at which time we will do blood work including fasting lipids. 2. Mixed dyslipidemia: Diet was discussed. Weight reduction was stressed we will recheck her lipids when she sees me fasting in the next visit in a few weeks 3. Diabetes mellitus and overweight status: Again emphasized this at length and she promises to do better. Her diabetes related issues are followed by primary care physician.  COVID-19 Education: The signs and symptoms of COVID-19 were discussed with the patient and how to seek care for testing (follow up with PCP or arrange E-visit).  The  importance of social distancing was discussed today.  Time:   Today, I have spent 15 minutes with the patient with telehealth technology discussing the above problems.     Medication Adjustments/Labs and Tests Ordered: Current medicines are reviewed at length with the patient today.  Concerns regarding medicines are outlined above.   Tests Ordered: No orders of the defined types were placed in this encounter.   Medication Changes: No orders of the defined types were placed in this encounter.   Follow Up:  In Person .[id number]    Signed, Jenean Lindau, MD  06/22/2019 10:31 AM    [place]

## 2019-06-23 ENCOUNTER — Ambulatory Visit: Payer: Medicare Other | Admitting: Physical Therapy

## 2019-06-27 ENCOUNTER — Encounter: Payer: Self-pay | Admitting: Physical Therapy

## 2019-06-27 ENCOUNTER — Other Ambulatory Visit: Payer: Self-pay

## 2019-06-27 ENCOUNTER — Ambulatory Visit: Payer: Medicare Other | Attending: Nurse Practitioner | Admitting: Physical Therapy

## 2019-06-27 DIAGNOSIS — R2681 Unsteadiness on feet: Secondary | ICD-10-CM | POA: Diagnosis not present

## 2019-06-27 DIAGNOSIS — R2689 Other abnormalities of gait and mobility: Secondary | ICD-10-CM | POA: Insufficient documentation

## 2019-06-27 DIAGNOSIS — M6281 Muscle weakness (generalized): Secondary | ICD-10-CM | POA: Insufficient documentation

## 2019-06-27 DIAGNOSIS — R262 Difficulty in walking, not elsewhere classified: Secondary | ICD-10-CM | POA: Diagnosis present

## 2019-06-27 NOTE — Therapy (Signed)
[facility] [address]  [place], [place], [address] Phone: [phone]   Fax:  [phone]  Physical Therapy Evaluation  Patient Details  Name: Brandy Roberts MRN: [id number] Date of Birth: 11/10/1970 Referring Provider (PT): [name], NP   Encounter Date: 06/27/2019  PT End of Session - 06/27/19 1206    Visit Number  1    Number of Visits  13    Date for PT Re-Evaluation  08/08/19    Authorization Type  [employer] Medicare & Medicaid    PT Start Time  0815   pt late   PT Stop Time  0847    PT Time Calculation (min)  32 min    Equipment Utilized During Treatment  Gait belt    Activity Tolerance  Patient tolerated treatment well    Behavior During Therapy  WFL for tasks assessed/performed       Past Medical History:  Diagnosis Date  . Anemia   . Anxiety   . Chronic ischemic vertebrobasilar artery thalamic stroke   . Depression   . Hyperlipidemia   . Hypertension   . Migraines   . Nonrheumatic aortic (valve) insufficiency   . Osteoarthritis   . Sleep apnea   . Stroke [facility]) 10/25/2016    Past Surgical History:  Procedure Laterality Date  . CESAREAN SECTION    . GANGLION CYST EXCISION    . TEE WITHOUT CARDIOVERSION N/A 10/09/2014   Procedure: TRANSESOPHAGEAL ECHOCARDIOGRAM (TEE);  Surgeon: Sanda Klein, MD;  Location: [facility] ENDOSCOPY;  Service: Cardiovascular;  Laterality: N/A;  . TONSILLECTOMY      There were no vitals filed for this visit.   Subjective Assessment - 06/27/19 0817    Subjective  Patient reporting that she has been falling in her apartment. Occurs when she is walking to the bathroom or kitchen. Feels that the falls are sometimes d/t feeling off balance and sometimes d/t feels dizzy and having a HAs. Feels that the dizziness and HAs are a result of her stroke 4-5 years ago. Uses a quad cane or 4WW sometimes while inside. Estimates 6+ falls in the last 6 months. Usually gets bruises when  she falls. Falls have been occurring since her stroke but have been worse in the last year.    Pertinent History  stroke, OA, migraines, HTN, HLD, depression, anxiety, anemia, ganglion cyst excision    Limitations  Standing;Walking;House hold activities    Diagnostic tests  10/11/18 brain MRI: No acute intracranial abnormality.Unchanged small chronic infarcts.    Patient Stated Goals  work on walking without falling    Currently in Pain?  No/denies         [facility] PT Assessment - 06/27/19 0823      Assessment   Medical Diagnosis  Frequent Falls    Referring Provider (PT)  [name], NP    Onset Date/Surgical Date  06/26/18    Hand Dominance  Right    Prior Therapy  yes- R knee      Precautions   Precautions  --   falls     Balance Screen   Has the patient fallen in the past 6 months  Yes    How many times?  6 or more    Has the patient had a decrease in activity level because of a fear of falling?   Yes    Is the patient reluctant to leave their home because of a fear of falling?   Yes  Home Environment   Living Environment  Private residence    Living Arrangements  Alone    Available Help at Discharge  Family    Type of [place] to enter    Entrance Stairs-Number of Steps  [place]  One level    [place] - 4 wheels      Prior Function   Level of [place] with household mobility with device   PCA- bathing, cleaning, cooking 7days/week   Vocation  On disability      Cognition   Overall Cognitive Status  Within Functional Limits for tasks assessed      Sensation   Light Touch  Impaired by gross assessment   loss of sensation in L UE and LE     Posture/Postural Control   Posture/Postural Control  Postural limitations    Postural Limitations  Rounded Shoulders;Posterior pelvic tilt      ROM / Strength   AROM / PROM / Strength  Strength;AROM       AROM   AROM Assessment Site  Ankle    Right/Left Ankle  Right;Left    Right Ankle Dorsiflexion  18    Left Ankle Dorsiflexion  -9      Strength   Strength Assessment Site  Hip;Knee;Ankle    Right/Left Hip  Right;Left    Right Hip Flexion  4+/5    Right Hip ABduction  4/5    Right Hip ADduction  4/5    Left Hip Flexion  2-/5    Left Hip ABduction  3-/5    Left Hip ADduction  2-/5    Right/Left Knee  Right;Left    Right Knee Flexion  4+/5    Right Knee Extension  4+/5    Left Knee Flexion  3/5    Left Knee Extension  3/5    Right/Left Ankle  Right;Left    Right Ankle Dorsiflexion  4/5    Right Ankle Plantar Flexion  4/5    Left Ankle Dorsiflexion  2/5    Left Ankle Plantar Flexion  2+/5      Ambulation/Gait   Assistive device  Small based quad cane    Gait Pattern  Step-to pattern;Step-through pattern;Decreased step length - right;Decreased stance time - left;Decreased hip/knee flexion - left;Decreased dorsiflexion - left;Decreased weight shift to left;Left circumduction;Left foot flat;Trunk flexed   very unsteady and discontinuous    Ambulation Surface  Level;Indoor    Gait velocity  severely decreased      Standardized Balance Assessment   Standardized Balance Assessment  Berg Balance Test;Five Times Sit to Stand    Five times sit to stand comments   45 sec using B UEs on armrests      Berg Balance Test   Sit to Stand  Able to stand  independently using hands    Standing Unsupported  Able to stand 2 minutes with supervision    Sitting with Back Unsupported but Feet Supported on Floor or Stool  Able to sit safely and securely 2 minutes    Stand to Sit  Controls descent by using hands    Berg comment:  unable to complete d/t limited time                Objective measurements completed on examination: See above findings.  PT Education - 06/27/19 1206    Education Details  prognosis, POC, advised patient to use 4WW next session d/t  considerable unsteadiness today    Person(s) Educated  Patient    Methods  Explanation    Comprehension  Verbalized understanding       PT Short Term Goals - 06/27/19 1217      PT SHORT TERM GOAL #1   Title  Patient to be independent with initial HEP.    Time  3    Period  Weeks    Status  New    Target Date  07/18/19        PT Long Term Goals - 06/27/19 1217      PT LONG TERM GOAL #1   Title  Patient to be independent with advanced HEP.    Time  6    Period  Weeks    Status  New    Target Date  08/08/19      PT LONG TERM GOAL #2   Title  Patient to demonstrate >=4+/5 strength in R LE and >=3+/5 strength in L LE.     Time  6    Period  Weeks    Status  New    Target Date  08/08/19      PT LONG TERM GOAL #3   Title  Patient to demonstrate TUG testing in <14 sec with LRAD to indicate decreased fall risk.    Time  6    Period  Weeks    Status  New    Target Date  08/08/19      PT LONG TERM GOAL #4   Title  Patient to score >44/56 on Berg balance test with use of quad cane to demonstrate decreased fall risk.     Time  6    Period  Weeks    Status  New    Target Date  08/08/19      PT LONG TERM GOAL #5   Title  Patient to demonstrate stair climbing up/down 13 steps with 1 handrail and quad cane with good stability and safe sequencing.     Time  6    Period  Weeks    Status  New    Target Date  08/08/19      Additional Long Term Goals   Additional Long Term Goals  Yes      PT LONG TERM GOAL #6   Title  Patient to perform 5xSTS in </= 25 sec with use of B armrests in order to decrease risk of falls.    Time  6    Period  Weeks    Status  New    Target Date  08/08/19             Plan - 06/27/19 1207    Clinical Impression Statement  Patient is a 49y/o F presenting to OPPT with c/o imbalance and frequent falls for the past year. PMH significant for stroke resulting in L sided hemiparesis and sensation loss. Patient currently ambulating with quad cane  but also uses 4WW occasionally. Notes 6+ falls in the past 6 months resulting from "imbalance" or "dizziness and HAs." Patient today demonstrating decreased L LE strength, decreased L ankle dorsiflexion AROM, abnormal posture, and gait deviations. Patient's score on 5xSTS indicates increased risk of falls. Assessment today limited d/t time- plan to complete Berg Balance test next session to further assess balance. D/t patient's hemiparesis, would likely benefit from use of AFO on  [place] patient to arrive with 4WW next session d/t considerable unsteadiness and "furniture walking" to the point of requiring escort by clinic staff out of clinic to ensure safety. Patient reported understanding. Would benefit from skilled PT service 2x/week for 6 weeks to address aforementioned impairments.    Personal Factors and Comorbidities  Comorbidity 3+;Transportation;Time since onset of injury/illness/exacerbation;Fitness;Past/Current Experience    Comorbidities  stroke, OA, migraines, HTN, HLD, depression, anxiety, anemia, ganglion cyst excision    Examination-Activity Limitations  Bathing;Bend;Stairs;Carry;Stand;Toileting;Dressing;Transfers;Hygiene/Grooming;Lift;Locomotion Level;Reach Overhead    Examination-Participation Restrictions  Church;Cleaning;Interpersonal Relationship;Laundry;Meal Prep    Stability/Clinical Decision Making  Stable/Uncomplicated    Clinical Decision Making  Low    Rehab Potential  Good    PT Frequency  2x / week    PT Duration  6 weeks    PT Treatment/Interventions  ADLs/Self Care Home Management;Cryotherapy;Electrical Stimulation;Moist Heat;Balance training;Therapeutic exercise;Therapeutic activities;Functional mobility training;Stair training;Gait training;Ultrasound;Neuromuscular re-education;Patient/family education;Manual techniques;Vasopneumatic Device;Taping;Splinting;Energy conservation;Passive range of motion    PT Next Visit Plan  complete berg balance test, TUG, administer  HEP, gastroc stretching    Consulted and Agree with Plan of Care  Patient       Patient will benefit from skilled therapeutic intervention in order to improve the following deficits and impairments:  Abnormal gait, Decreased knowledge of precautions, Decreased activity tolerance, Decreased strength, Decreased balance, Decreased mobility, Difficulty walking, Improper body mechanics, Decreased range of motion, Impaired flexibility, Postural dysfunction  Visit Diagnosis: Unsteadiness on feet  Other abnormalities of gait and mobility  Difficulty in walking, not elsewhere classified  Muscle weakness (generalized)     Problem List Patient Active Problem List   Diagnosis Date Noted  . Palpitations 10/12/2018  . Chest discomfort 10/12/2018  . Syncope and collapse 10/07/2018  . Migraine 05/21/2018  . Snoring 05/02/2015  . Essential hypertension 05/02/2015  . Headache 05/01/2015  . Former smoker 01/24/2015  . Left hemiparesis ([place]) 10/23/2014  . Cardiomyopathy due to hypertension ([place]) 10/23/2014  . Chronic ischemic vertebrobasilar artery thalamic stroke   . Hypertensive heart disease   . Overweight (BMI 25.0-29.9)   . H/O noncompliance with medical treatment, presenting hazards to health   . Aortic regurgitation   . History of ischemic middle cerebral artery stroke embolic   . Hyperlipidemia      Janene Harvey, PT, DPT 06/27/19 12:23 PM   [facility] [address]  [place], [place], [address] Phone: [phone]   Fax:  [phone]  Name: Brandy Roberts MRN: [id number] Date of Birth: 02/15/1971

## 2019-06-28 ENCOUNTER — Other Ambulatory Visit: Payer: Self-pay | Admitting: Nurse Practitioner

## 2019-06-29 ENCOUNTER — Other Ambulatory Visit: Payer: Self-pay

## 2019-06-29 ENCOUNTER — Ambulatory Visit: Payer: Medicare Other

## 2019-06-29 DIAGNOSIS — R2689 Other abnormalities of gait and mobility: Secondary | ICD-10-CM

## 2019-06-29 DIAGNOSIS — M6281 Muscle weakness (generalized): Secondary | ICD-10-CM

## 2019-06-29 DIAGNOSIS — R2681 Unsteadiness on feet: Secondary | ICD-10-CM | POA: Diagnosis not present

## 2019-06-29 DIAGNOSIS — R262 Difficulty in walking, not elsewhere classified: Secondary | ICD-10-CM

## 2019-06-29 NOTE — Therapy (Signed)
[facility] [address]  [place], [place], [address] Phone: [phone]   Fax:  [phone]  Physical Therapy Treatment  Patient Details  Name: Brandy Roberts MRN: [id number] Date of Birth: [birth year] Referring Provider (PT): [name], NP   Encounter Date: 06/29/2019  PT End of Session - 06/29/19 0953    Visit Number  2    Number of Visits  13    Date for PT Re-Evaluation  08/08/19    Authorization Type  [employer]HC Medicare & Medicaid    PT Start Time  [phone]    PT Stop Time  1023    PT Time Calculation (min)  45 min    Equipment Utilized During Treatment  Gait belt    Activity Tolerance  Patient tolerated treatment well    Behavior During Therapy  [facility] for tasks assessed/performed       Past Medical History:  Diagnosis Date  . Anemia   . Anxiety   . Chronic ischemic vertebrobasilar artery thalamic stroke   . Depression   . Hyperlipidemia   . Hypertension   . Migraines   . Nonrheumatic aortic (valve) insufficiency   . Osteoarthritis   . Sleep apnea   . Stroke [facility]) 10/25/2016    Past Surgical History:  Procedure Laterality Date  . CESAREAN SECTION    . GANGLION CYST EXCISION    . TEE WITHOUT CARDIOVERSION N/A 10/09/2014   Procedure: TRANSESOPHAGEAL ECHOCARDIOGRAM (TEE);  Surgeon: Sanda Klein, MD;  Location: [facility] ENDOSCOPY;  Service: Cardiovascular;  Laterality: N/A;  . TONSILLECTOMY      There were no vitals filed for this visit.  Subjective Assessment - 06/29/19 0953    Subjective  Sleepy this morning.    Pertinent History  stroke, OA, migraines, HTN, HLD, depression, anxiety, anemia, ganglion cyst excision    Diagnostic tests  10/11/18 brain MRI: No acute intracranial abnormality.Unchanged small chronic infarcts.    Patient Stated Goals  work on walking without falling         [facility] PT Assessment - 06/29/19 0001      Standardized Balance Assessment   Standardized Balance Assessment  Timed  Up and Go Test      Berg Balance Test   Sit to Stand  Able to stand  independently using hands    Standing Unsupported  Able to stand 2 minutes with supervision    Sitting with Back Unsupported but Feet Supported on Floor or Stool  Able to sit safely and securely 2 minutes    Stand to Sit  Controls descent by using hands    Transfers  Able to transfer with verbal cueing and /or supervision    Standing Unsupported with Eyes Closed  Able to stand 10 seconds with supervision    Standing Unsupported with Feet Together  Able to place feet together independently and stand for 1 minute with supervision    From Standing, Reach Forward with Outstretched Arm  Reaches forward but needs supervision    From Standing Position, Pick up Object from Floor  Able to pick up shoe, needs supervision    From Standing Position, Turn to Look Behind Over each Shoulder  Looks behind one side only/other side shows less weight shift    Turn 360 Degrees  Needs assistance while turning    Standing Unsupported, Alternately Place Feet on Step/Stool  Able to complete >2 steps/needs minimal assist    Standing Unsupported, One Foot in Front  Able to take  small step independently and hold 30 seconds    Standing on One Leg  Unable to try or needs assist to prevent fall    Total Score  31    Berg comment:  31/56 demonstrating high fall risk       Timed Up and Go Test   Normal TUG (seconds)  14.37   14.37 sec with RW                  [facility] Adult PT Treatment/Exercise - 06/29/19 0001      Knee/Hip Exercises: Stretches   Gastroc Stretch  Right;Left;1 rep;30 seconds    Gastroc Stretch Limitations  seated with strap       Knee/Hip Exercises: Aerobic   Nustep  Lvl 1, 7 min (LE/UE)      Knee/Hip Exercises: Standing   Hip Flexion  Right;Left;10 reps;Knee bent    Hip Flexion Limitations  march in RW             PT Education - 06/29/19 1145    Education Details  HEP update;  standing march in RW,  calf  stretch    Person(s) Educated  Patient    Methods  Explanation;Demonstration;Verbal cues;Handout    Comprehension  Verbalized understanding;Returned demonstration;Verbal cues required       PT Short Term Goals - 06/29/19 0954      PT SHORT TERM GOAL #1   Title  Patient to be independent with initial HEP.    Time  3    Period  Weeks    Status  On-going    Target Date  07/18/19        PT Long Term Goals - 06/29/19 0954      PT LONG TERM GOAL #1   Title  Patient to be independent with advanced HEP.    Time  6    Period  Weeks    Status  On-going      PT LONG TERM GOAL #2   Title  Patient to demonstrate >=4+/5 strength in R LE and >=3+/5 strength in L LE.     Time  6    Period  Weeks    Status  On-going      PT LONG TERM GOAL #3   Title  Patient to demonstrate TUG testing in <14 sec with LRAD to indicate decreased fall risk.    Time  6    Period  Weeks    Status  On-going      PT LONG TERM GOAL #4   Title  Patient to score >44/56 on Berg balance test with use of quad cane to demonstrate decreased fall risk.     Time  6    Period  Weeks    Status  On-going      PT LONG TERM GOAL #5   Title  Patient to demonstrate stair climbing up/down 13 steps with 1 handrail and quad cane with good stability and safe sequencing.     Time  6    Period  Weeks    Status  On-going      PT LONG TERM GOAL #6   Title  Patient to perform 5xSTS in </= 25 sec with use of B armrests in order to decrease risk of falls.    Time  6    Period  Weeks    Status  New            Plan - 06/29/19 0954    Clinical Impression  Statement  Kanya scoring 31/56 on BERG Balance testing today demonstrating high fall risk with most difficulty attempting SLS, narrow BOS, and alternating LE clearance tasks.  Close therapist supervision provided throughout standing activities in session today for safety.  TUG time at 14.37 sec with RW today however pt. demonstrating intermittent poor hand positioning and  RW spacing with sit<>stand transfers making her at a higher risk for falling.  Will benefit from further skilled therapy for improved LE strengthening, balance training to reduce fall risk, and instruction with RW sit<>stand transfers to improved safety.    Comorbidities  stroke, OA, migraines, HTN, HLD, depression, anxiety, anemia, ganglion cyst excision    Rehab Potential  Good    PT Treatment/Interventions  ADLs/Self Care Home Management;Cryotherapy;Electrical Stimulation;Moist Heat;Balance training;Therapeutic exercise;Therapeutic activities;Functional mobility training;Stair training;Gait training;Ultrasound;Neuromuscular re-education;Patient/family education;Manual techniques;Vasopneumatic Device;Taping;Splinting;Energy conservation;Passive range of motion    Consulted and Agree with Plan of Care  Patient       Patient will benefit from skilled therapeutic intervention in order to improve the following deficits and impairments:  Abnormal gait, Decreased knowledge of precautions, Decreased activity tolerance, Decreased strength, Decreased balance, Decreased mobility, Difficulty walking, Improper body mechanics, Decreased range of motion, Impaired flexibility, Postural dysfunction  Visit Diagnosis: Unsteadiness on feet  Other abnormalities of gait and mobility  Difficulty in walking, not elsewhere classified  Muscle weakness (generalized)     Problem List Patient Active Problem List   Diagnosis Date Noted  . Palpitations 10/12/2018  . Chest discomfort 10/12/2018  . Syncope and collapse 10/07/2018  . Migraine 05/21/2018  . Snoring 05/02/2015  . Essential hypertension 05/02/2015  . Headache 05/01/2015  . Former smoker 01/24/2015  . Left hemiparesis ([place]) 10/23/2014  . Cardiomyopathy due to hypertension ([place]) 10/23/2014  . Chronic ischemic vertebrobasilar artery thalamic stroke   . Hypertensive heart disease   . Overweight (BMI 25.0-29.9)   . H/O noncompliance with medical  treatment, presenting hazards to health   . Aortic regurgitation   . History of ischemic middle cerebral artery stroke embolic   . Hyperlipidemia     Bess Harvest, PTA 06/29/19 11:56 AM   [facility] [address]  [place], [place], [address] Phone: [phone]   Fax:  [phone]  Name: Brandy Roberts MRN: [id number] Date of Birth: 12/26/1970

## 2019-07-04 ENCOUNTER — Ambulatory Visit: Payer: Medicare Other

## 2019-07-05 ENCOUNTER — Encounter: Payer: Self-pay | Admitting: Physical Therapy

## 2019-07-05 ENCOUNTER — Other Ambulatory Visit: Payer: Self-pay

## 2019-07-05 ENCOUNTER — Ambulatory Visit: Payer: Medicare Other | Attending: Nurse Practitioner | Admitting: Physical Therapy

## 2019-07-05 VITALS — BP 145/70 | HR 62

## 2019-07-05 DIAGNOSIS — R2681 Unsteadiness on feet: Secondary | ICD-10-CM | POA: Insufficient documentation

## 2019-07-05 DIAGNOSIS — M6281 Muscle weakness (generalized): Secondary | ICD-10-CM | POA: Diagnosis present

## 2019-07-05 DIAGNOSIS — R262 Difficulty in walking, not elsewhere classified: Secondary | ICD-10-CM | POA: Diagnosis present

## 2019-07-05 DIAGNOSIS — R2689 Other abnormalities of gait and mobility: Secondary | ICD-10-CM | POA: Insufficient documentation

## 2019-07-05 NOTE — Therapy (Signed)
[facility] [address]  [place], [place], [address] Phone: [phone]   Fax:  [phone]  Physical Therapy Treatment  Patient Details  Name: Brandy Roberts MRN: [id number] Date of Birth: 08/18/1970 Referring Provider (PT): [name], NP   Encounter Date: 07/05/2019  PT End of Session - 07/05/19 1014    Visit Number  3    Number of Visits  13    Date for PT Re-Evaluation  08/08/19    Authorization Type  [employer] Medicare & Medicaid    PT Start Time  0933    PT Stop Time  1011    PT Time Calculation (min)  38 min    Equipment Utilized During Treatment  Gait belt    Activity Tolerance  Patient tolerated treatment well;Patient limited by fatigue    Behavior During Therapy  [facility] for tasks assessed/performed       Past Medical History:  Diagnosis Date  . Anemia   . Anxiety   . Chronic ischemic vertebrobasilar artery thalamic stroke   . Depression   . Hyperlipidemia   . Hypertension   . Migraines   . Nonrheumatic aortic (valve) insufficiency   . Osteoarthritis   . Sleep apnea   . Stroke [facility]) 10/25/2016    Past Surgical History:  Procedure Laterality Date  . CESAREAN SECTION    . GANGLION CYST EXCISION    . TEE WITHOUT CARDIOVERSION N/A 10/09/2014   Procedure: TRANSESOPHAGEAL ECHOCARDIOGRAM (TEE);  Surgeon: Sanda Klein, MD;  Location: [place];  Service: Cardiovascular;  Laterality: N/A;  . TONSILLECTOMY      Vitals:   07/05/19 0934 07/05/19 1002  BP: (!) 160/68 (!) 145/70  Pulse: 62 62  SpO2: 96% 97%    Subjective Assessment - 07/05/19 0936    Subjective  Notes that her BP has been running high. Has not taken her BP meds this AM. Has been using her rollator around her apartment.    Pertinent History  stroke, OA, migraines, HTN, HLD, depression, anxiety, anemia, ganglion cyst excision    Diagnostic tests  10/11/18 brain MRI: No acute intracranial abnormality.Unchanged small chronic  infarcts.    Patient Stated Goals  work on walking without falling    Currently in Pain?  No/denies                       [facility] Adult PT Treatment/Exercise - 07/05/19 0001      Transfers   Transfers  Sit to Stand    Sit to Stand  5: Supervision    Number of Reps  --   3   Comments  cues required to reach back for seat upon sitting down instead of holding onto walker      Exercises   Exercises  Knee/Hip      Knee/Hip Exercises: Stretches   Gastroc Stretch  Right;Left;30 seconds;2 reps    Gastroc Stretch Limitations  seated with heel on stool and strap       Knee/Hip Exercises: Aerobic   Nustep  Lvl 1, 6 min (LE/UE)   attempted L2 but patient fatigued, vitals stable     Knee/Hip Exercises: Standing   Hip Flexion  Stengthening;1 set;20 reps;Knee bent    Hip Flexion Limitations  march in RW   CGA; L knee buckling but able to self correct     Knee/Hip Exercises: Seated   Sit to Sand  2 sets;5 reps;with UE support;without UE support  intermittent cues to scoot forward and foot placmenet              PT Short Term Goals - 07/05/19 1014      PT SHORT TERM GOAL #1   Title  Patient to be independent with initial HEP.    Time  3    Period  Weeks    Status  On-going    Target Date  07/18/19        PT Long Term Goals - 06/29/19 0954      PT LONG TERM GOAL #1   Title  Patient to be independent with advanced HEP.    Time  6    Period  Weeks    Status  On-going      PT LONG TERM GOAL #2   Title  Patient to demonstrate >=4+/5 strength in R LE and >=3+/5 strength in L LE.     Time  6    Period  Weeks    Status  On-going      PT LONG TERM GOAL #3   Title  Patient to demonstrate TUG testing in <14 sec with LRAD to indicate decreased fall risk.    Time  6    Period  Weeks    Status  On-going      PT LONG TERM GOAL #4   Title  Patient to score >44/56 on Berg balance test with use of quad cane to demonstrate decreased fall risk.     Time  6     Period  Weeks    Status  On-going      PT LONG TERM GOAL #5   Title  Patient to demonstrate stair climbing up/down 13 steps with 1 handrail and quad cane with good stability and safe sequencing.     Time  6    Period  Weeks    Status  On-going      PT LONG TERM GOAL #6   Title  Patient to perform 5xSTS in </= 25 sec with use of B armrests in order to decrease risk of falls.    Time  6    Period  Weeks    Status  New            Plan - 07/05/19 1014    Clinical Impression Statement  Patient ambulating into session with 4WW, still with slight instability but much improved compared to [facility]. Encouraged patient to continue using this AD. Worked on safe STS transfers with [phone] as patient with tendency to sit down without reaching back for seat behind her. Able to demonstrate better safety awareness after practice. Worked on STS transfers without walker support and without hand support with patient requiring cues for set up and demonstrating slight shakiness upon standing. However, able to demonstrate improved control with increased reps. Reviewed HEP- patient demonstrating slight L knee buckling with marching and with report of "I feel like I just ran a mile" after completing these exercises. Vitals WFL. Patient took sit break until she was able to continue. Tolerated rest of session well. No complaints at end of session. Patient progressing well, limited by fatigue and lack of endurance.    Comorbidities  stroke, OA, migraines, HTN, HLD, depression, anxiety, anemia, ganglion cyst excision    Rehab Potential  Good    PT Treatment/Interventions  ADLs/Self Care Home Management;Cryotherapy;Electrical Stimulation;Moist Heat;Balance training;Therapeutic exercise;Therapeutic activities;Functional mobility training;Stair training;Gait training;Ultrasound;Neuromuscular re-education;Patient/family education;Manual techniques;Vasopneumatic Device;Taping;Splinting;Energy conservation;Passive range of motion     PT Next  Visit Plan  STS transfers, balance training    Consulted and Agree with Plan of Care  Patient       Patient will benefit from skilled therapeutic intervention in order to improve the following deficits and impairments:  Abnormal gait, Decreased knowledge of precautions, Decreased activity tolerance, Decreased strength, Decreased balance, Decreased mobility, Difficulty walking, Improper body mechanics, Decreased range of motion, Impaired flexibility, Postural dysfunction  Visit Diagnosis: Unsteadiness on feet  Other abnormalities of gait and mobility  Difficulty in walking, not elsewhere classified  Muscle weakness (generalized)     Problem List Patient Active Problem List   Diagnosis Date Noted  . Palpitations 10/12/2018  . Chest discomfort 10/12/2018  . Syncope and collapse 10/07/2018  . Migraine 05/21/2018  . Snoring 05/02/2015  . Essential hypertension 05/02/2015  . Headache 05/01/2015  . Former smoker 01/24/2015  . Left hemiparesis ([place]) 10/23/2014  . Cardiomyopathy due to hypertension ([place]) 10/23/2014  . Chronic ischemic vertebrobasilar artery thalamic stroke   . Hypertensive heart disease   . Overweight (BMI 25.0-29.9)   . H/O noncompliance with medical treatment, presenting hazards to health   . Aortic regurgitation   . History of ischemic middle cerebral artery stroke embolic   . Hyperlipidemia      Janene Harvey, PT, DPT 07/05/19 10:22 AM   [facility] [address]  [place], [place], [address] Phone: [phone]   Fax:  [phone]  Name: Lynlea Hauer MRN: [id number] Date of Birth: 31-Oct-1970

## 2019-07-06 ENCOUNTER — Encounter: Payer: Self-pay | Admitting: Physical Therapy

## 2019-07-06 ENCOUNTER — Ambulatory Visit: Payer: Medicare Other | Admitting: Physical Therapy

## 2019-07-06 VITALS — BP 110/63 | HR 63

## 2019-07-06 DIAGNOSIS — R2689 Other abnormalities of gait and mobility: Secondary | ICD-10-CM

## 2019-07-06 DIAGNOSIS — R2681 Unsteadiness on feet: Secondary | ICD-10-CM

## 2019-07-06 DIAGNOSIS — M6281 Muscle weakness (generalized): Secondary | ICD-10-CM

## 2019-07-06 DIAGNOSIS — R262 Difficulty in walking, not elsewhere classified: Secondary | ICD-10-CM

## 2019-07-06 NOTE — Therapy (Signed)
[facility] [address]  [place], [place], [address] Phone: [phone]   Fax:  [phone]  Physical Therapy Treatment  Patient Details  Name: Brandy Roberts MRN: [id number] Date of Birth: [birth year] Referring Provider (PT): [name], NP   Encounter Date: 07/06/2019  PT End of Session - 07/06/19 0958    Visit Number  4    Number of Visits  13    Date for PT Re-Evaluation  08/08/19    Authorization Type  [employer]HC Medicare & Medicaid    PT Start Time  0905    PT Stop Time  0957    PT Time Calculation (min)  52 min    Equipment Utilized During Treatment  Gait belt    Activity Tolerance  Patient tolerated treatment well;Patient limited by fatigue    Behavior During Therapy  [facility] for tasks assessed/performed       Past Medical History:  Diagnosis Date  . Anemia   . Anxiety   . Chronic ischemic vertebrobasilar artery thalamic stroke   . Depression   . Hyperlipidemia   . Hypertension   . Migraines   . Nonrheumatic aortic (valve) insufficiency   . Osteoarthritis   . Sleep apnea   . Stroke [facility]) 10/25/2016    Past Surgical History:  Procedure Laterality Date  . CESAREAN SECTION    . GANGLION CYST EXCISION    . TEE WITHOUT CARDIOVERSION N/A 10/09/2014   Procedure: TRANSESOPHAGEAL ECHOCARDIOGRAM (TEE);  Surgeon: Sanda Klein, MD;  Location: [facility] ENDOSCOPY;  Service: Cardiovascular;  Laterality: N/A;  . TONSILLECTOMY      Vitals:   07/06/19 0908  BP: 110/63  Pulse: 63  SpO2: 93%    Subjective Assessment - 07/06/19 0911    Subjective  Reports that she has taken her BP meds this AM.    Pertinent History  stroke, OA, migraines, HTN, HLD, depression, anxiety, anemia, ganglion cyst excision    Diagnostic tests  10/11/18 brain MRI: No acute intracranial abnormality.Unchanged small chronic infarcts.    Patient Stated Goals  work on walking without falling    Currently in Pain?  No/denies                        [facility] Adult PT Treatment/Exercise - 07/06/19 0001      Neuro Re-ed    Neuro Re-ed Details   R/L toe tap on cone with 1 UE support on counter and CGA x10 R LE, 2x5 L LE; romberg balance EO x30", EC 3x30" with intermittent perturbations to challenge balance      Knee/Hip Exercises: Stretches   Gastroc Stretch  Right;Left;30 seconds;1 rep    Gastroc Stretch Limitations  toes on towel roll at counter top      Knee/Hip Exercises: Aerobic   Nustep  Lvl 1, 6 min (LE/UE)      Knee/Hip Exercises: Standing   Heel Raises  Both;2 sets;10 reps    Heel Raises Limitations  at counter top   cues for B TKE; limited ROM   Terminal Knee Extension  Strengthening;Right;Left;1 set;10 reps    Terminal Knee Extension Limitations  against ball at counter top; 10x3" each LE      Knee/Hip Exercises: Seated   Other Seated Knee/Hip Exercises  sitting prayer stretch with green pball 5x3" each to front, R, L to tolerance             PT Education - 07/06/19 [id number]  Education Details  update to HEP    Person(s) Educated  Patient    Methods  Explanation;Demonstration;Tactile cues;Verbal cues;Handout    Comprehension  Verbalized understanding;Returned demonstration       PT Short Term Goals - 07/05/19 1014      PT SHORT TERM GOAL #1   Title  Patient to be independent with initial HEP.    Time  3    Period  Weeks    Status  On-going    Target Date  07/18/19        PT Long Term Goals - 06/29/19 0954      PT LONG TERM GOAL #1   Title  Patient to be independent with advanced HEP.    Time  6    Period  Weeks    Status  On-going      PT LONG TERM GOAL #2   Title  Patient to demonstrate >=4+/5 strength in R LE and >=3+/5 strength in L LE.     Time  6    Period  Weeks    Status  On-going      PT LONG TERM GOAL #3   Title  Patient to demonstrate TUG testing in <14 sec with LRAD to indicate decreased fall risk.    Time  6    Period  Weeks    Status   On-going      PT LONG TERM GOAL #4   Title  Patient to score >44/56 on Berg balance test with use of quad cane to demonstrate decreased fall risk.     Time  6    Period  Weeks    Status  On-going      PT LONG TERM GOAL #5   Title  Patient to demonstrate stair climbing up/down 13 steps with 1 handrail and quad cane with good stability and safe sequencing.     Time  6    Period  Weeks    Status  On-going      PT LONG TERM GOAL #6   Title  Patient to perform 5xSTS in </= 25 sec with use of B armrests in order to decrease risk of falls.    Time  6    Period  Weeks    Status  New            Plan - 07/06/19 0959    Clinical Impression Statement  Patient without complaints this AM. Demonstrating improved carryover of safety awareness and hand positioning with transfers today. Worked on standing LE strengthening with countertop support. Initiated TKE on L LE with patient demonstrating quad instability and shakiness d/t weakness; improved ability on R LE. Patient required intermittent sitting rest breaks d/t fatigue. Challenged dynamic balance with cone taps with patient demonstrating good stability while standing on L LE, but with limited coordination and ROM when lifting L LE up to cone. Required sit break d/t "aching" in L LE and back pain. Proceeded with gentle lumbopelvic stretching to ease pain and tension in LB- patient did report improvement in pain. Able to continue with static balance training at countertop. Updated HEP with exercises that were well tolerated and performed safely in today's and previous sessions. Patient reported understanding and without complaints at end of session.    Comorbidities  stroke, OA, migraines, HTN, HLD, depression, anxiety, anemia, ganglion cyst excision    Rehab Potential  Good    PT Treatment/Interventions  ADLs/Self Care Home Management;Cryotherapy;Electrical Stimulation;Moist Heat;Balance training;Therapeutic exercise;Therapeutic  activities;Functional mobility training;Stair  training;Gait training;Ultrasound;Neuromuscular re-education;Patient/family education;Manual techniques;Vasopneumatic Device;Taping;Splinting;Energy conservation;Passive range of motion    PT Next Visit Plan  STS transfers, balance training    Consulted and Agree with Plan of Care  Patient       Patient will benefit from skilled therapeutic intervention in order to improve the following deficits and impairments:  Abnormal gait, Decreased knowledge of precautions, Decreased activity tolerance, Decreased strength, Decreased balance, Decreased mobility, Difficulty walking, Improper body mechanics, Decreased range of motion, Impaired flexibility, Postural dysfunction  Visit Diagnosis: Unsteadiness on feet  Other abnormalities of gait and mobility  Difficulty in walking, not elsewhere classified  Muscle weakness (generalized)     Problem List Patient Active Problem List   Diagnosis Date Noted  . Palpitations 10/12/2018  . Chest discomfort 10/12/2018  . Syncope and collapse 10/07/2018  . Migraine 05/21/2018  . Snoring 05/02/2015  . Essential hypertension 05/02/2015  . Headache 05/01/2015  . Former smoker 01/24/2015  . Left hemiparesis ([place]) 10/23/2014  . Cardiomyopathy due to hypertension ([place]) 10/23/2014  . Chronic ischemic vertebrobasilar artery thalamic stroke   . Hypertensive heart disease   . Overweight (BMI 25.0-29.9)   . H/O noncompliance with medical treatment, presenting hazards to health   . Aortic regurgitation   . History of ischemic middle cerebral artery stroke embolic   . Hyperlipidemia      Janene Harvey, PT, DPT 07/06/19 10:07 AM   [facility] [address]  [place], [place], [address] Phone: [phone]   Fax:  [phone]  Name: Shaquel Peardon MRN: [id number] Date of Birth: 02-03-1971

## 2019-07-11 ENCOUNTER — Encounter: Payer: Self-pay | Admitting: Physical Therapy

## 2019-07-11 ENCOUNTER — Other Ambulatory Visit: Payer: Self-pay

## 2019-07-11 ENCOUNTER — Ambulatory Visit: Payer: Medicare Other | Admitting: Physical Therapy

## 2019-07-11 VITALS — BP 125/65 | HR 59

## 2019-07-11 DIAGNOSIS — R2681 Unsteadiness on feet: Secondary | ICD-10-CM | POA: Diagnosis not present

## 2019-07-11 DIAGNOSIS — R262 Difficulty in walking, not elsewhere classified: Secondary | ICD-10-CM

## 2019-07-11 DIAGNOSIS — R2689 Other abnormalities of gait and mobility: Secondary | ICD-10-CM

## 2019-07-11 DIAGNOSIS — M6281 Muscle weakness (generalized): Secondary | ICD-10-CM

## 2019-07-11 NOTE — Therapy (Signed)
[facility] [address]  [place], [place], [address] Phone: [phone]   Fax:  [phone]  Physical Therapy Treatment  Patient Details  Name: Brandy Roberts MRN: [id number] Date of Birth: 1971/04/27 Referring Provider (PT): [name], NP   Encounter Date: 07/11/2019  PT End of Session - 07/11/19 0943    Visit Number  5    Number of Visits  13    Date for PT Re-Evaluation  08/08/19    Authorization Type  [employer]HC Medicare & Medicaid    PT Start Time  0900    PT Stop Time  0941    PT Time Calculation (min)  41 min    Equipment Utilized During Treatment  Gait belt    Activity Tolerance  Patient tolerated treatment well;Patient limited by fatigue    Behavior During Therapy  [facility] for tasks assessed/performed       Past Medical History:  Diagnosis Date  . Anemia   . Anxiety   . Chronic ischemic vertebrobasilar artery thalamic stroke   . Depression   . Hyperlipidemia   . Hypertension   . Migraines   . Nonrheumatic aortic (valve) insufficiency   . Osteoarthritis   . Sleep apnea   . Stroke [facility]) 10/25/2016    Past Surgical History:  Procedure Laterality Date  . CESAREAN SECTION    . GANGLION CYST EXCISION    . TEE WITHOUT CARDIOVERSION N/A 10/09/2014   Procedure: TRANSESOPHAGEAL ECHOCARDIOGRAM (TEE);  Surgeon: Sanda Klein, MD;  Location: [facility] ENDOSCOPY;  Service: Cardiovascular;  Laterality: N/A;  . TONSILLECTOMY      Vitals:   07/11/19 0901  BP: 125/65  Pulse: (!) 59  SpO2: 97%    Subjective Assessment - 07/11/19 0905    Subjective  Feeling good. Denies falls since last session. R knee was acting up over the weekend.    Pertinent History  stroke, OA, migraines, HTN, HLD, depression, anxiety, anemia, ganglion cyst excision    Diagnostic tests  10/11/18 brain MRI: No acute intracranial abnormality.Unchanged small chronic infarcts.    Patient Stated Goals  work on walking without falling    Currently in Pain?  No/denies                       [facility] Adult PT Treatment/Exercise - 07/11/19 0001      Neuro Re-ed    Neuro Re-ed Details   romberg balance with perturbations x2 min, with EC x2 min; hip strategy anterior and posterior against wall x6 min; R/L SLS + ant-pos ball roll x10 each LE with B UE support on counter top      Knee/Hip Exercises: Aerobic   Nustep  Lvl 1, 6 min (LE/UE)      Knee/Hip Exercises: Standing   Heel Raises  Both;1 set;15 reps    Heel Raises Limitations  at counter top   cues for TKE on B sides   Forward Step Up  Right;Left;5 reps;Hand Hold: 2;Step Height: 6";3 sets    Forward Step Up Limitations  x5 each LE step up/back, x5 L LE step up/over; with 1 UE support on counter and 1 HH   good control              PT Short Term Goals - 07/11/19 0944      PT SHORT TERM GOAL #1   Title  Patient to be independent with initial HEP.    Time  3  Period  Weeks    Status  Achieved    Target Date  07/18/19        PT Long Term Goals - 06/29/19 0954      PT LONG TERM GOAL #1   Title  Patient to be independent with advanced HEP.    Time  6    Period  Weeks    Status  On-going      PT LONG TERM GOAL #2   Title  Patient to demonstrate >=4+/5 strength in R LE and >=3+/5 strength in L LE.     Time  6    Period  Weeks    Status  On-going      PT LONG TERM GOAL #3   Title  Patient to demonstrate TUG testing in <14 sec with LRAD to indicate decreased fall risk.    Time  6    Period  Weeks    Status  On-going      PT LONG TERM GOAL #4   Title  Patient to score >44/56 on Berg balance test with use of quad cane to demonstrate decreased fall risk.     Time  6    Period  Weeks    Status  On-going      PT LONG TERM GOAL #5   Title  Patient to demonstrate stair climbing up/down 13 steps with 1 handrail and quad cane with good stability and safe sequencing.     Time  6    Period  Weeks    Status  On-going      PT LONG TERM  GOAL #6   Title  Patient to perform 5xSTS in </= 25 sec with use of B armrests in order to decrease risk of falls.    Time  6    Period  Weeks    Status  New            Plan - 07/11/19 0944    Clinical Impression Statement  Patient reporting flare of chronic R knee pain over the weekend, but denying pain this AM. Continued practice of standing static balance and LE strengthening. Patient with good tolerance for Romberg balance with perturbations with EO and EC. Able to perform step ups with good balance and control.  Most difficulty occurred with L LE step up onto step d/t weakness in hip flexion. Patient also reported onset of dizziness when turning around after each rep of step ups, thus exercise was modified to avoid this. Initiated hip strategy practice against the wall with patient demonstrating excellent muscle control and balance. Reported L LE pain after performing single leg stance activity, which was quickly resolved with sitting rest break. Patient without complaints at end of session. Progressing well towards goals.    Comorbidities  stroke, OA, migraines, HTN, HLD, depression, anxiety, anemia, ganglion cyst excision    Rehab Potential  Good    PT Treatment/Interventions  ADLs/Self Care Home Management;Cryotherapy;Electrical Stimulation;Moist Heat;Balance training;Therapeutic exercise;Therapeutic activities;Functional mobility training;Stair training;Gait training;Ultrasound;Neuromuscular re-education;Patient/family education;Manual techniques;Vasopneumatic Device;Taping;Splinting;Energy conservation;Passive range of motion    PT Next Visit Plan  STS transfers, balance training    Consulted and Agree with Plan of Care  Patient       Patient will benefit from skilled therapeutic intervention in order to improve the following deficits and impairments:  Abnormal gait, Decreased knowledge of precautions, Decreased activity tolerance, Decreased strength, Decreased balance, Decreased  mobility, Difficulty walking, Improper body mechanics, Decreased range of motion, Impaired flexibility, Postural dysfunction  Visit  Diagnosis: Unsteadiness on feet  Other abnormalities of gait and mobility  Difficulty in walking, not elsewhere classified  Muscle weakness (generalized)     Problem List Patient Active Problem List   Diagnosis Date Noted  . Palpitations 10/12/2018  . Chest discomfort 10/12/2018  . Syncope and collapse 10/07/2018  . Migraine 05/21/2018  . Snoring 05/02/2015  . Essential hypertension 05/02/2015  . Headache 05/01/2015  . Former smoker 01/24/2015  . Left hemiparesis ([place]) 10/23/2014  . Cardiomyopathy due to hypertension ([place]) 10/23/2014  . Chronic ischemic vertebrobasilar artery thalamic stroke   . Hypertensive heart disease   . Overweight (BMI 25.0-29.9)   . H/O noncompliance with medical treatment, presenting hazards to health   . Aortic regurgitation   . History of ischemic middle cerebral artery stroke embolic   . Hyperlipidemia      Janene Harvey, PT, DPT 07/11/19 9:45 AM   [facility] [address]  [place], [place], [address] Phone: [phone]   Fax:  [phone]  Name: Brandy Roberts MRN: [id number] Date of Birth: Mar 19, 1971

## 2019-07-13 ENCOUNTER — Other Ambulatory Visit: Payer: Self-pay

## 2019-07-13 ENCOUNTER — Ambulatory Visit: Payer: Medicare Other

## 2019-07-13 VITALS — BP 120/64 | HR 71

## 2019-07-13 DIAGNOSIS — R262 Difficulty in walking, not elsewhere classified: Secondary | ICD-10-CM

## 2019-07-13 DIAGNOSIS — R2681 Unsteadiness on feet: Secondary | ICD-10-CM

## 2019-07-13 DIAGNOSIS — R2689 Other abnormalities of gait and mobility: Secondary | ICD-10-CM

## 2019-07-13 DIAGNOSIS — M6281 Muscle weakness (generalized): Secondary | ICD-10-CM

## 2019-07-13 NOTE — Therapy (Signed)
[facility] [address]  [place], [place], [address] Phone: [phone]   Fax:  [phone]  Physical Therapy Treatment  Patient Details  Name: Brandy Roberts MRN: [id number] Date of Birth: 1970-11-29 Referring Provider (PT): [name], NP   Encounter Date: 07/13/2019  PT End of Session - 07/13/19 0904    Visit Number  6    Number of Visits  13    Date for PT Re-Evaluation  08/08/19    Authorization Type  [employer] Medicare & Medicaid    PT Start Time  [phone]   pt. reporting she was late to session due to [facility] transportation coming to wrong location to pick her up   PT Stop Time  0851    PT Time Calculation (min)  28 min    Equipment Utilized During Treatment  Gait belt    Activity Tolerance  Patient tolerated treatment well    Behavior During Therapy  [facility] for tasks assessed/performed       Past Medical History:  Diagnosis Date  . Anemia   . Anxiety   . Chronic ischemic vertebrobasilar artery thalamic stroke   . Depression   . Hyperlipidemia   . Hypertension   . Migraines   . Nonrheumatic aortic (valve) insufficiency   . Osteoarthritis   . Sleep apnea   . Stroke [facility]) 10/25/2016    Past Surgical History:  Procedure Laterality Date  . CESAREAN SECTION    . GANGLION CYST EXCISION    . TEE WITHOUT CARDIOVERSION N/A 10/09/2014   Procedure: TRANSESOPHAGEAL ECHOCARDIOGRAM (TEE);  Surgeon: Sanda Klein, MD;  Location: [facility] ENDOSCOPY;  Service: Cardiovascular;  Laterality: N/A;  . TONSILLECTOMY      Vitals:   07/13/19 0908  BP: 120/64  Pulse: 71  SpO2: 94%    Subjective Assessment - 07/13/19 0829    Subjective  Pt. reporting she is late due to difficulty with transportation service "coming to wrong location".    Pertinent History  stroke, OA, migraines, HTN, HLD, depression, anxiety, anemia, ganglion cyst excision    Diagnostic tests  10/11/18 brain MRI: No acute intracranial abnormality.Unchanged  small chronic infarcts.    Patient Stated Goals  work on walking without falling    Currently in Pain?  No/denies    Multiple Pain Sites  No                       [facility] Adult PT Treatment/Exercise - 07/13/19 0001      Neuro Re-ed    Neuro Re-ed Details   Standing at counter top with gait belt and therapist S/CGA:  tandem forward walk followed by standard backwards walk x 2 laps; side stepping without UE support x 2 laps; B SLS 2 x 10 reps hand support at sink      Knee/Hip Exercises: Stretches   Hip Flexor Stretch  --    Hip Flexor Stretch Limitations  --      Knee/Hip Exercises: Aerobic   Nustep  Lvl 1, 3 min (LE/UE)   reduced time due to limited tx time      Knee/Hip Exercises: Standing   Heel Raises  Both;1 set;15 reps    Heel Raises Limitations  at counter top    Functional Squat  10 reps;3 seconds;2 sets    Functional Squat Limitations  1st set to 2 airex pads at counter with chair; 2nd set to 1 airex pad on chair holding onto counter  Other Standing Knee Exercises  toe raise at counter x 15 reps; at counter       Knee/Hip Exercises: Seated   Sit to Sand  2 sets;5 reps;with UE support   pushoff from knees from chair - improved LE stability noted               PT Short Term Goals - 07/11/19 0944      PT SHORT TERM GOAL #1   Title  Patient to be independent with initial HEP.    Time  3    Period  Weeks    Status  Achieved    Target Date  07/18/19        PT Long Term Goals - 07/13/19 [id number]      PT LONG TERM GOAL #1   Title  Patient to be independent with advanced HEP.    Time  6    Period  Weeks    Status  On-going      PT LONG TERM GOAL #2   Title  Patient to demonstrate >=4+/5 strength in R LE and >=3+/5 strength in L LE.     Time  6    Period  Weeks    Status  On-going      PT LONG TERM GOAL #3   Title  Patient to demonstrate TUG testing in <14 sec with LRAD to indicate decreased fall risk.    Time  6    Period  Weeks     Status  On-going      PT LONG TERM GOAL #4   Title  Patient to score >44/56 on Berg balance test with use of quad cane to demonstrate decreased fall risk.     Time  6    Period  Weeks    Status  On-going      PT LONG TERM GOAL #5   Title  Patient to demonstrate stair climbing up/down 13 steps with 1 handrail and quad cane with good stability and safe sequencing.     Time  6    Period  Weeks    Status  On-going      PT LONG TERM GOAL #6   Title  Patient to perform 5xSTS in </= 25 sec with use of B armrests in order to decrease risk of falls.    Time  6    Period  Weeks    Status  On-going            Plan - 07/13/19 [id number]    Clinical Impression Statement  Pt. arrived late to treatment thus session time limited.  Pt. reports [facility] [employer] arrived late to pick her up due to going to wrong location.  Session focused on standing LE strengthening and standing balance activities with tandem walk and SLS at counter with supervision.  Pt. tolerated all balance activities well with most difficulty coordinating movement pattern with tandem forward walk and backwards standard walk with UE support.  Ended visit pain free and pt. noting mild LE fatigue.  Pt. notes she is performing HEP daily.    Comorbidities  stroke, OA, migraines, HTN, HLD, depression, anxiety, anemia, ganglion cyst excision    Rehab Potential  Good    PT Treatment/Interventions  ADLs/Self Care Home Management;Cryotherapy;Electrical Stimulation;Moist Heat;Balance training;Therapeutic exercise;Therapeutic activities;Functional mobility training;Stair training;Gait training;Ultrasound;Neuromuscular re-education;Patient/family education;Manual techniques;Vasopneumatic Device;Taping;Splinting;Energy conservation;Passive range of motion    PT Next Visit Plan  STS transfers, balance training    Consulted and Agree with Plan of Care  Patient       Patient will benefit from skilled therapeutic intervention in order to improve  the following deficits and impairments:  Abnormal gait, Decreased knowledge of precautions, Decreased activity tolerance, Decreased strength, Decreased balance, Decreased mobility, Difficulty walking, Improper body mechanics, Decreased range of motion, Impaired flexibility, Postural dysfunction  Visit Diagnosis: Unsteadiness on feet  Other abnormalities of gait and mobility  Difficulty in walking, not elsewhere classified  Muscle weakness (generalized)     Problem List Patient Active Problem List   Diagnosis Date Noted  . Palpitations 10/12/2018  . Chest discomfort 10/12/2018  . Syncope and collapse 10/07/2018  . Migraine 05/21/2018  . Snoring 05/02/2015  . Essential hypertension 05/02/2015  . Headache 05/01/2015  . Former smoker 01/24/2015  . Left hemiparesis ([place]) 10/23/2014  . Cardiomyopathy due to hypertension ([place]) 10/23/2014  . Chronic ischemic vertebrobasilar artery thalamic stroke   . Hypertensive heart disease   . Overweight (BMI 25.0-29.9)   . H/O noncompliance with medical treatment, presenting hazards to health   . Aortic regurgitation   . History of ischemic middle cerebral artery stroke embolic   . Hyperlipidemia     Bess Harvest, PTA 07/13/19 9:58 AM   [facility] [address]  [place], [place], [address] Phone: [phone]   Fax:  [phone]  Name: Kentara Naro MRN: [id number] Date of Birth: 24-Jun-1970

## 2019-07-18 ENCOUNTER — Ambulatory Visit (INDEPENDENT_AMBULATORY_CARE_PROVIDER_SITE_OTHER): Payer: Medicare Other | Admitting: Cardiology

## 2019-07-18 ENCOUNTER — Ambulatory Visit: Payer: Medicare Other

## 2019-07-18 ENCOUNTER — Encounter: Payer: Self-pay | Admitting: Cardiology

## 2019-07-18 ENCOUNTER — Other Ambulatory Visit: Payer: Self-pay

## 2019-07-18 VITALS — BP 112/70 | HR 98

## 2019-07-18 VITALS — BP 100/56 | HR 63 | Ht 66.0 in | Wt 199.0 lb

## 2019-07-18 DIAGNOSIS — I351 Nonrheumatic aortic (valve) insufficiency: Secondary | ICD-10-CM | POA: Diagnosis not present

## 2019-07-18 DIAGNOSIS — R2681 Unsteadiness on feet: Secondary | ICD-10-CM | POA: Diagnosis not present

## 2019-07-18 DIAGNOSIS — I10 Essential (primary) hypertension: Secondary | ICD-10-CM | POA: Diagnosis not present

## 2019-07-18 DIAGNOSIS — R2689 Other abnormalities of gait and mobility: Secondary | ICD-10-CM

## 2019-07-18 DIAGNOSIS — R262 Difficulty in walking, not elsewhere classified: Secondary | ICD-10-CM

## 2019-07-18 DIAGNOSIS — M6281 Muscle weakness (generalized): Secondary | ICD-10-CM

## 2019-07-18 MED ORDER — NITROGLYCERIN 0.4 MG SL SUBL: 0.4000 mg | SUBLINGUAL_TABLET | SUBLINGUAL | 3 refills | Status: DC | PRN

## 2019-07-18 NOTE — Patient Instructions (Signed)
Medication Instructions:  No medication changes *If you need a refill on your cardiac medications before your next appointment, please call your pharmacy*  Lab Work: None ordered If you have labs (blood work) drawn today and your tests are completely normal, you will receive your results only by: . MyChart Message (if you have MyChart) OR . A paper copy in the mail If you have any lab test that is abnormal or we need to change your treatment, we will call you to review the results.  Testing/Procedures: None ordered  Follow-Up: At [facility], you and your health needs are our priority.  As part of our continuing mission to provide you with exceptional heart care, we have created designated Provider Care Teams.  These Care Teams include your primary Cardiologist (physician) and Advanced Practice Providers (APPs -  Physician Assistants and Nurse Practitioners) who all work together to provide you with the care you need, when you need it.  Your next appointment:   6 month(s)  The format for your next appointment:   In Person  Provider:   Rajan Revankar, MD  Other Instructions NA 

## 2019-07-18 NOTE — Therapy (Signed)
[facility] [address]  [place], [place], [address] Phone: [phone]   Fax:  [phone]  Physical Therapy Treatment  Patient Details  Name: Brandy Roberts  MRN: [id number] Date of Birth: [birth year] Referring Provider (PT): [name], NP   Encounter Date: 07/18/2019  PT End of Session - 07/18/19 0829    Visit Number  7    Number of Visits  13    Date for PT Re-Evaluation  08/08/19    Authorization Type  [employer]HC Medicare & Medicaid    PT Start Time  0807    PT Stop Time  0849    PT Time Calculation (min)  42 min    Equipment Utilized During Treatment  Gait belt    Activity Tolerance  Patient tolerated treatment well    Behavior During Therapy  [facility] for tasks assessed/performed       Past Medical History:  Diagnosis Date  . Anemia   . Anxiety   . Chronic ischemic vertebrobasilar artery thalamic stroke   . Depression   . Hyperlipidemia   . Hypertension   . Migraines   . Nonrheumatic aortic (valve) insufficiency   . Osteoarthritis   . Sleep apnea   . Stroke [facility]) 10/25/2016    Past Surgical History:  Procedure Laterality Date  . CESAREAN SECTION    . GANGLION CYST EXCISION    . TEE WITHOUT CARDIOVERSION N/A 10/09/2014   Procedure: TRANSESOPHAGEAL ECHOCARDIOGRAM (TEE);  Surgeon: Sanda Klein, MD;  Location: [facility] ENDOSCOPY;  Service: Cardiovascular;  Laterality: N/A;  . TONSILLECTOMY      Vitals:   07/18/19 0830  BP: 112/70  Pulse: 98  SpO2: 97%    Subjective Assessment - 07/18/19 0829    Subjective  Pt. doing well today.    Pertinent History  stroke, OA, migraines, HTN, HLD, depression, anxiety, anemia, ganglion cyst excision    Diagnostic tests  10/11/18 brain MRI: No acute intracranial abnormality.Unchanged small chronic infarcts.    Patient Stated Goals  work on walking without falling    Currently in Pain?  No/denies    Multiple Pain Sites  No         [facility] PT Assessment - 07/18/19  0001      Assessment   Medical Diagnosis  Frequent Falls    Referring Provider (PT)  [name], NP    Onset Date/Surgical Date  06/26/18    Hand Dominance  Right    Next MD Visit  ~ 08/23/19                   [facility] Adult PT Treatment/Exercise - 07/18/19 0001      Neuro Re-ed    Neuro Re-ed Details   R UE bean bag toss in standard x 1 rounds (~ 12 bags) NO UE support, 1 round of bag toss R UE in staggered stance (L LE forward) x 12 bags; therapist supervision       Knee/Hip Exercises: Aerobic   Nustep  Lvl 3, 6 min (LE/UE)      Knee/Hip Exercises: Standing   Knee Flexion  Left;10 reps;Strengthening    Knee Flexion Limitations  L only; chair     Hip Flexion  Right;Left;10 reps;Knee bent    Hip Flexion Limitations  yellow looped TB at forefoot     Hip Abduction  Right;Left;10 reps;Knee straight    Abduction Limitations  at chair     Forward Step Up  Right;Left;10 reps;Step Height:  6";Hand Hold: 1;1 set    Forward Step Up Limitations  chair       Knee/Hip Exercises: Seated   Long Arc Quad  Left;10 reps;Strengthening    Long Arc Quad Limitations  yellow TB at ankles; focusing on cueing for [employer]  Right;Left;10 reps;Strengthening    Marching Limitations  yellow TB at knees              PT Education - 07/18/19 0908    Education Details  HEP update;  with chair support or RW:  standing hip abduction (x 5 reps), standing HS curl, RW LE march    Person(s) Educated  Patient    Methods  Explanation;Demonstration;Verbal cues;Handout    Comprehension  Verbalized understanding;Returned demonstration;Verbal cues required       PT Short Term Goals - 07/11/19 0944      PT SHORT TERM GOAL #1   Title  Patient to be independent with initial HEP.    Time  3    Period  Weeks    Status  Achieved    Target Date  07/18/19        PT Long Term Goals - 07/13/19 [id number]      PT LONG TERM GOAL #1   Title  Patient to be independent with advanced HEP.    Time   6    Period  Weeks    Status  On-going      PT LONG TERM GOAL #2   Title  Patient to demonstrate >=4+/5 strength in R LE and >=3+/5 strength in L LE.     Time  6    Period  Weeks    Status  On-going      PT LONG TERM GOAL #3   Title  Patient to demonstrate TUG testing in <14 sec with LRAD to indicate decreased fall risk.    Time  6    Period  Weeks    Status  On-going      PT LONG TERM GOAL #4   Title  Patient to score >44/56 on Berg balance test with use of quad cane to demonstrate decreased fall risk.     Time  6    Period  Weeks    Status  On-going      PT LONG TERM GOAL #5   Title  Patient to demonstrate stair climbing up/down 13 steps with 1 handrail and quad cane with good stability and safe sequencing.     Time  6    Period  Weeks    Status  On-going      PT LONG TERM GOAL #6   Title  Patient to perform 5xSTS in </= 25 sec with use of B armrests in order to decrease risk of falls.    Time  6    Period  Weeks    Status  On-going            Plan - 07/18/19 0909    Clinical Impression Statement  Brandy Roberts doing well today.  Notes she had intense headache on Saturday (which she notes has been occasional since stroke) which subsided later that day.  Reports that HEP is going well.  Progressed to more standing LE/hip strengthening today which was tolerated well with pt. noting LE fatigue following.  Updated HEP (see pt. education section).  Progressed balance training to include staggered stance bean bag throw with pt. requiring close supervision from therapist for safety and visible L LE instability  however pt. able to self-maintain balance.  Ended visit with pt. noting LE fatigue and verbalizing understanding of updated HEP handout issued to her.  Will plan to monitor tolerance to updated HEP at upcoming session.    Comorbidities  stroke, OA, migraines, HTN, HLD, depression, anxiety, anemia, ganglion cyst excision    Rehab Potential  Good    PT Treatment/Interventions   ADLs/Self Care Home Management;Cryotherapy;Electrical Stimulation;Moist Heat;Balance training;Therapeutic exercise;Therapeutic activities;Functional mobility training;Stair training;Gait training;Ultrasound;Neuromuscular re-education;Patient/family education;Manual techniques;Vasopneumatic Device;Taping;Splinting;Energy conservation;Passive range of motion    PT Next Visit Plan  Monitor tolerance to updated HEP; STS transfers, balance training    PT Home Exercise Plan  06/29/19 - Seated gastroc stretch, RW standing march; 07/06/19 - Standing heel raise, STS from chair at counter;  07/18/19 - with chair support or RW:  standing hip abduction (x 5 reps), standing HS curl, RW LE march    Consulted and Agree with Plan of Care  Patient       Patient will benefit from skilled therapeutic intervention in order to improve the following deficits and impairments:  Abnormal gait, Decreased knowledge of precautions, Decreased activity tolerance, Decreased strength, Decreased balance, Decreased mobility, Difficulty walking, Improper body mechanics, Decreased range of motion, Impaired flexibility, Postural dysfunction  Visit Diagnosis: Unsteadiness on feet  Other abnormalities of gait and mobility  Difficulty in walking, not elsewhere classified  Muscle weakness (generalized)     Problem List Patient Active Problem List   Diagnosis Date Noted  . Palpitations 10/12/2018  . Chest discomfort 10/12/2018  . Syncope and collapse 10/07/2018  . Migraine 05/21/2018  . Snoring 05/02/2015  . Essential hypertension 05/02/2015  . Headache 05/01/2015  . Former smoker 01/24/2015  . Left hemiparesis ([place]) 10/23/2014  . Cardiomyopathy due to hypertension ([place]) 10/23/2014  . Chronic ischemic vertebrobasilar artery thalamic stroke   . Hypertensive heart disease   . Overweight (BMI 25.0-29.9)   . H/O noncompliance with medical treatment, presenting hazards to health   . Aortic regurgitation   . History of  ischemic middle cerebral artery stroke embolic   . Hyperlipidemia     Bess Harvest, [place] 07/18/19 9:23 AM   [facility] [address]  [place], [place], [address] Phone: [phone]   Fax:  [phone]  Name: Brandy Roberts MRN: [id number] Date of Birth: September 16, 1970

## 2019-07-18 NOTE — Progress Notes (Signed)
Cardiology Office Note:    Date:  07/18/2019   ID:  Brandy Roberts, DOB [birth year], MRN [id number]  PCP:  [name], NP  Cardiologist:  Jenean Lindau, MD   Referring MD: [name], NP    ASSESSMENT:    1. Aortic valve insufficiency, etiology of cardiac valve disease unspecified   2. Essential hypertension    PLAN:    In order of problems listed above:  1. Primary prevention stressed with the patient.  Importance of compliance with diet and medication stressed and she vocalized understanding. 2. Essential hypertension: Blood pressure stable.  Diet was discussed.  Weight reduction was stressed. 3. Aortic regurgitation: Stable at this time and we will review echocardiogram in 6 months 4. Mixed dyslipidemia and history of stroke: Managed by primary care physician.  I discussed diet for this. 5. Patient will be seen in follow-up appointment in 6 months or earlier if the patient has any concerns    Medication Adjustments/Labs and Tests Ordered: Current medicines are reviewed at length with the patient today.  Concerns regarding medicines are outlined above.  No orders of the defined types were placed in this encounter.  Meds ordered this encounter  Medications  . nitroGLYCERIN (NITROSTAT) 0.4 MG SL tablet    Sig: Place 1 tablet (0.4 mg total) under the tongue every 5 (five) minutes as needed for chest pain.    Dispense:  90 tablet    Refill:  3     Chief Complaint  Patient presents with  . Follow-up    1 Month      History of Present Illness:    Brandy Roberts is a 49 y.o. female.  Patient has history of essential hypertension, mild aortic regurgitation.  She denies any problems at this time and takes care of activities of daily living.  No chest pain orthopnea or PND.  At the time of my evaluation, the patient is alert awake oriented and in no distress.  Past Medical History:  Diagnosis Date  . Anemia   . Anxiety   . Chronic ischemic  vertebrobasilar artery thalamic stroke   . Depression   . Hyperlipidemia   . Hypertension   . Migraines   . Nonrheumatic aortic (valve) insufficiency   . Osteoarthritis   . Sleep apnea   . Stroke [facility]) 10/25/2016    Past Surgical History:  Procedure Laterality Date  . CESAREAN SECTION    . GANGLION CYST EXCISION    . TEE WITHOUT CARDIOVERSION N/A 10/09/2014   Procedure: TRANSESOPHAGEAL ECHOCARDIOGRAM (TEE);  Surgeon: Sanda Klein, MD;  Location: [facility] ENDOSCOPY;  Service: Cardiovascular;  Laterality: N/A;  . TONSILLECTOMY      Current Medications: Current Meds  Medication Sig  . amLODipine (NORVASC) 10 MG tablet Take 1 tablet (10 mg total) by mouth daily.  [name] atorvastatin (LIPITOR) 80 MG tablet Take 1 tablet (80 mg total) by mouth daily at 6 PM.  . clopidogrel (PLAVIX) 75 MG tablet Take 1 tablet (75 mg total) by mouth daily.  . Elastic Bandages & Supports (WRIST/THUMB SPLINT/RIGHT MED) MISC 1 each by Does not apply route daily.  [name] EMGALITY 120 MG/ML SOAJ Inject 130 mg into the skin every 30 (thirty) days.  . flurbiprofen (ANSAID) 100 MG tablet TAKE 1 TABLET BY MOUTH EVERY 8 HOURS AS NEEDED (MAX 3 TABS IN 24 HOURS)  . gabapentin (NEURONTIN) 300 MG capsule Take 1 capsule (300 mg total) by mouth 2 (two) times daily.  . hydrALAZINE (APRESOLINE) 50 MG tablet  Take 1 tablet (50 mg total) by mouth 3 (three) times daily.  . hydrochlorothiazide (MICROZIDE) 12.5 MG capsule Take 1 capsule (12.5 mg total) by mouth daily.  [name] lisinopril (ZESTRIL) 40 MG tablet Take 1 tablet (40 mg total) by mouth daily.  . metoCLOPramide (REGLAN) 10 MG tablet Take 1 tablet (10 mg total) by mouth every 6 (six) hours as needed for nausea (and headache).  . metoprolol tartrate (LOPRESSOR) 25 MG tablet Take 1 tablet (25 mg total) by mouth 2 (two) times daily.  . Misc. Devices MISC Please provide patient with insurance approved blood pressure monitor.  . mupirocin ointment (BACTROBAN) 2 % Apply 1 application topically 2  (two) times daily.  . nitroGLYCERIN (NITROSTAT) 0.4 MG SL tablet Place 1 tablet (0.4 mg total) under the tongue every 5 (five) minutes as needed for chest pain.  . pantoprazole (PROTONIX) 20 MG tablet Take 1 tablet (20 mg total) by mouth daily.  [name] spironolactone (ALDACTONE) 25 MG tablet Take 1 tablet (25 mg total) by mouth daily.  . traZODone (DESYREL) 100 MG tablet Take 1 tablet (100 mg total) by mouth at bedtime.  [name] venlafaxine XR (EFFEXOR-XR) 75 MG 24 hr capsule TAKE 3 CAPSULES(225MG  TOTAL) BY MOUTH DAILY  . [DISCONTINUED] nitroGLYCERIN (NITROSTAT) 0.4 MG SL tablet Place 1 tablet (0.4 mg total) under the tongue every 5 (five) minutes as needed for chest pain.     Allergies:   Ibuprofen and Tylenol [acetaminophen]   Social History   Socioeconomic History  . Marital status: Single    Spouse name: Not on file  . Number of children: 2  . Years of education: 77  . Highest education level: High school graduate  Occupational History  . Occupation: disable   Tobacco Use  . Smoking status: Current Some Day Smoker    Packs/day: 0.25    Last attempt to quit: 08/31/2017    Years since quitting: 1.8  . Smokeless tobacco: Never Used  . Tobacco comment: Pt. still smoke marijuana.   Substance and Sexual Activity  . Alcohol use: No    Alcohol/week: 0.0 standard drinks  . Drug use: Yes    Types: Marijuana  . Sexual activity: Yes    Birth control/protection: Post-menopausal  Other Topics Concern  . Not on file  Social History Narrative   Patient lives with her uncle in a one story home.  Has 2 sons.  Currently on disability.  Education: high school.   Drinks 1-2 sodas a week       Social Determinants of Health   Financial Resource Strain:   . Difficulty of Paying Living Expenses: Not on file  Food Insecurity:   . Worried About Charity fundraiser in the Last Year: Not on file  . Ran Out of Food in the Last Year: Not on file  Transportation Needs:   . Lack of Transportation (Medical):  Not on file  . Lack of Transportation (Non-Medical): Not on file  Physical Activity:   . Days of Exercise per Week: Not on file  . Minutes of Exercise per Session: Not on file  Stress:   . Feeling of Stress : Not on file  Social Connections:   . Frequency of Communication with Friends and Family: Not on file  . Frequency of Social Gatherings with Friends and Family: Not on file  . Attends Religious Services: Not on file  . Active Member of Clubs or Organizations: Not on file  . Attends Archivist Meetings: Not on  file  . Marital Status: Not on file     Family History: The patient's family history includes Cancer in her maternal aunt and mother; Diabetes in her sister; Hypertension in her mother and sister.  ROS:   Please see the history of present illness.    All other systems reviewed and are negative.  EKGs/Labs/Other Studies Reviewed:    The following studies were reviewed today: EVENT MONITOR REPORT:   Patient was monitored from 10/14/2018 to 10/27/2018. Indication:                    Syncope and collapse Ordering physician:  Jenean Lindau, MD  Referring physician:        Jenean Lindau, MD    Baseline rhythm: Sinus  Minimum heart rate: 47 BPM.  Average heart rate: 69 BPM.  Maximal heart rate 132 BPM.  Atrial arrhythmia: None significant.  Rare PACs  Ventricular arrhythmia: None significant rare ventricular bigeminy and trigeminy  Conduction abnormality: None significant  Symptoms: None significant   Conclusion:  Mildly abnormal but overall unremarkable event monitor.  Interpreting  cardiologist: Jenean Lindau, MD  Date: 11/25/2018 1:33 PM    Study Highlights    The left ventricular ejection fraction is normal (55-65%).  Nuclear stress EF: 60%.  There was no ST segment deviation noted during stress.  Defect 1: There is a small defect of mild severity present in the apex location. No ischemia.  This is a low risk  study.   Candee Furbish, MD   IMPRESSIONS    1. The left ventricle has normal systolic function of 123456. The cavity  size was normal. There is no increased left ventricular wall thickness.  Echo evidence of impaired diastolic relaxation.  2. The right ventricle has normal systolic function. The cavity was  normal. There is no increase in right ventricular wall thickness.  3. Left atrial size was mildly dilated.  4. The mitral valve is normal in structure.  5. The tricuspid valve is normal in structure.  6. Mild aortic regurgitation.    Recent Labs: 09/30/2018: Hemoglobin 12.4; Platelets 248; TSH 0.995 05/17/2019: ALT 25; BUN 18; Creatinine, Ser 1.04; Potassium 4.1; Sodium 142  Recent Lipid Panel    Component Value Date/Time   CHOL 111 12/15/2017 1444   TRIG 61 12/15/2017 1444   HDL 42 12/15/2017 1444   CHOLHDL 2.6 12/15/2017 1444   CHOLHDL 3.3 10/25/2016 0248   VLDL 8 10/25/2016 0248   LDLCALC 57 12/15/2017 1444    Physical Exam:    VS:  BP (!) 100/56   Pulse 63   Ht 5\' 6"  (1.676 m)   Wt 199 lb (90.3 kg)   SpO2 98%   BMI 32.12 kg/m     Wt Readings from Last 3 Encounters:  07/18/19 199 lb (90.3 kg)  06/22/19 198 lb 8 oz (90 kg)  05/17/19 198 lb (89.8 kg)     GEN: Patient is in no acute distress HEENT: Normal NECK: No JVD; No carotid bruits LYMPHATICS: No lymphadenopathy CARDIAC: Hear sounds regular, 2/6 systolic murmur at the apex. RESPIRATORY:  Clear to auscultation without rales, wheezing or rhonchi  ABDOMEN: Soft, non-tender, non-distended MUSCULOSKELETAL:  No edema; No deformity  SKIN: Warm and dry NEUROLOGIC:  Alert and oriented x 3 PSYCHIATRIC:  Normal affect   Signed, Jenean Lindau, MD  07/18/2019 9:48 AM    [facility]

## 2019-07-19 ENCOUNTER — Telehealth: Payer: Self-pay | Admitting: Cardiology

## 2019-07-19 ENCOUNTER — Telehealth: Payer: Self-pay

## 2019-07-19 LAB — BASIC METABOLIC PANEL
BUN/Creatinine Ratio: 19 (ref 9–23)
BUN: 20 mg/dL (ref 6–24)
CO2: 27 mmol/L (ref 20–29)
Calcium: 9.1 mg/dL (ref 8.7–10.2)
Chloride: 104 mmol/L (ref 96–106)
Creatinine, Ser: 1.06 mg/dL — ABNORMAL HIGH (ref 0.57–1.00)
GFR calc Af Amer: 71 mL/min/{1.73_m2} (ref >59)
GFR calc non Af Amer: 62 mL/min/{1.73_m2} (ref >59)
Glucose: 110 mg/dL — ABNORMAL HIGH (ref 65–99)
Potassium: 4.5 mmol/L (ref 3.5–5.2)
Sodium: 141 mmol/L (ref 134–144)

## 2019-07-19 LAB — CBC
Hematocrit: 42.4 % (ref 34.0–46.6)
Hemoglobin: 13.6 g/dL (ref 11.1–15.9)
MCH: 28.8 pg (ref 26.6–33.0)
MCHC: 32.1 g/dL (ref 31.5–35.7)
MCV: 90 fL (ref 79–97)
Platelets: 222 10*3/uL (ref 150–450)
RBC: 4.73 x10E6/uL (ref 3.77–5.28)
RDW: 13.5 % (ref 11.7–15.4)
WBC: 5.9 10*3/uL (ref 3.4–10.8)

## 2019-07-19 LAB — HEPATIC FUNCTION PANEL
ALT: 26 IU/L (ref 0–32)
AST: 22 IU/L (ref 0–40)
Albumin: 4.3 g/dL (ref 3.8–4.8)
Alkaline Phosphatase: 106 IU/L (ref 39–117)
Bilirubin Total: 0.3 mg/dL (ref 0.0–1.2)
Bilirubin, Direct: 0.12 mg/dL (ref 0.00–0.40)
Total Protein: 7.1 g/dL (ref 6.0–8.5)

## 2019-07-19 LAB — LIPID PANEL
Chol/HDL Ratio: 2.7 ratio (ref 0.0–4.4)
Cholesterol, Total: 124 mg/dL (ref 100–199)
HDL: 46 mg/dL (ref >39)
LDL Chol Calc (NIH): 66 mg/dL (ref 0–99)
Triglycerides: 55 mg/dL (ref 0–149)
VLDL Cholesterol Cal: 12 mg/dL (ref 5–40)

## 2019-07-19 LAB — TSH: TSH: 0.342 u[IU]/mL — ABNORMAL LOW (ref 0.450–4.500)

## 2019-07-19 NOTE — Telephone Encounter (Signed)
Spoke with patient regarding her thyroid results and the importance of her follow up.  Pt verbalized understanding and importance of a follow up.  Pt states that she will call for an appt.

## 2019-07-19 NOTE — Telephone Encounter (Signed)
Follow up:     Patient returning a call back. Please call patient. 

## 2019-07-19 NOTE — Telephone Encounter (Signed)
Called and spoke with the staff at [facility]. Raul Del, NP's office to make them aware of abnormal thyroid.    Called and left a detailed message on pt's VM as ok per DPR. Message with abnormal result as reviewed by Dr. Geraldo Pitter and the need to see her primary care as soon as possible.

## 2019-07-19 NOTE — Telephone Encounter (Signed)
-----   Message from Jenean Lindau, MD sent at 07/19/2019 10:14 AM EST ----- Thyroid test is abnormal and she needs to talk to her primary care physician about it.  The other results of the study is unremarkable. Please inform patient. I will discuss in detail at next appointment. Cc  primary care/referring physician Jenean Lindau, MD 07/19/2019 10:14 AM

## 2019-07-20 ENCOUNTER — Encounter: Payer: Self-pay | Admitting: Physical Therapy

## 2019-07-20 ENCOUNTER — Ambulatory Visit: Payer: Medicare Other | Attending: Nurse Practitioner | Admitting: Nurse Practitioner

## 2019-07-20 ENCOUNTER — Ambulatory Visit: Payer: Medicare Other | Admitting: Physical Therapy

## 2019-07-20 ENCOUNTER — Other Ambulatory Visit: Payer: Self-pay

## 2019-07-20 ENCOUNTER — Encounter: Payer: Self-pay | Admitting: Nurse Practitioner

## 2019-07-20 VITALS — BP 123/58 | HR 60

## 2019-07-20 DIAGNOSIS — R2689 Other abnormalities of gait and mobility: Secondary | ICD-10-CM

## 2019-07-20 DIAGNOSIS — R2681 Unsteadiness on feet: Secondary | ICD-10-CM | POA: Diagnosis not present

## 2019-07-20 DIAGNOSIS — R030 Elevated blood-pressure reading, without diagnosis of hypertension: Secondary | ICD-10-CM

## 2019-07-20 DIAGNOSIS — M6281 Muscle weakness (generalized): Secondary | ICD-10-CM

## 2019-07-20 DIAGNOSIS — R262 Difficulty in walking, not elsewhere classified: Secondary | ICD-10-CM

## 2019-07-20 DIAGNOSIS — R7989 Other specified abnormal findings of blood chemistry: Secondary | ICD-10-CM

## 2019-07-20 NOTE — Therapy (Signed)
[facility] [address]  [place], [place], [address] Phone: [phone]   Fax:  [phone]  Physical Therapy Treatment  Patient Details  Name: Brandy Roberts MRN: [id number] Date of Birth: [birth year] Referring Provider (PT): [name], NP   Encounter Date: 07/20/2019  PT End of Session - 07/20/19 0914    Visit Number  8    Number of Visits  13    Date for PT Re-Evaluation  08/08/19    Authorization Type  [employer] Medicare & Medicaid    PT Start Time  0829    PT Stop Time  0914    PT Time Calculation (min)  45 min    Equipment Utilized During Treatment  Gait belt    Activity Tolerance  Patient tolerated treatment well    Behavior During Therapy  [facility] for tasks assessed/performed       Past Medical History:  Diagnosis Date  . Anemia   . Anxiety   . Chronic ischemic vertebrobasilar artery thalamic stroke   . Depression   . Hyperlipidemia   . Hypertension   . Migraines   . Nonrheumatic aortic (valve) insufficiency   . Osteoarthritis   . Sleep apnea   . Stroke [facility]) 10/25/2016    Past Surgical History:  Procedure Laterality Date  . CESAREAN SECTION    . GANGLION CYST EXCISION    . TEE WITHOUT CARDIOVERSION N/A 10/09/2014   Procedure: TRANSESOPHAGEAL ECHOCARDIOGRAM (TEE);  Surgeon: Sanda Klein, MD;  Location: [place];  Service: Cardiovascular;  Laterality: N/A;  . TONSILLECTOMY      Vitals:   07/20/19 0830  BP: (!) 123/58  Pulse: 60  SpO2: 99%    Subjective Assessment - 07/20/19 0834    Subjective  Emotional d/t having to take several medications d/t her health conditions.    Pertinent History  stroke, OA, migraines, HTN, HLD, depression, anxiety, anemia, ganglion cyst excision    Diagnostic tests  10/11/18 brain MRI: No acute intracranial abnormality.Unchanged small chronic infarcts.    Patient Stated Goals  work on walking without falling    Currently in Pain?  No/denies                        [facility] Adult PT Treatment/Exercise - 07/20/19 0001      Ambulation/Gait   Ambulation/Gait  Yes    Ambulation/Gait Assistance  5: Supervision;4: Min guard    Ambulation Distance (Feet)  250 Feet    Assistive device  Straight cane;Rollator    Gait Pattern  Step-to pattern;Step-through pattern;Decreased step length - right;Decreased stance time - left;Decreased hip/knee flexion - left;Decreased dorsiflexion - left;Decreased weight shift to left;Left circumduction;Left foot flat;Trunk flexed    Ambulation Surface  Level;Indoor    Gait Comments  gait training with 4WW and SPC with cues for cane sequencing and continuity of stepping      Neuro Re-ed    Neuro Re-ed Details   tandem walking with hand hovering over counter top and CGA 4x length of countertop; grapevine 2x length of counter with CGA- mosre difficulty towards L LE       Knee/Hip Exercises: Aerobic   Nustep  Lvl 3, 6 min (LE/UE)      Knee/Hip Exercises: Standing   Forward Step Up  Left;1 set;10 reps;Hand Hold: 2;Step Height: 6"    Forward Step Up Limitations  L LE step up/R LE step down with SPC and 1 UE support on counter  cues to improve toe clearance on L; CGA/min A            PT Education - 07/20/19 0913    Education Details  edu on benefits of using AFO on L LE    Person(s) Educated  Patient    Methods  Explanation;Demonstration    Comprehension  Verbalized understanding       PT Short Term Goals - 07/11/19 0944      PT SHORT TERM GOAL #1   Title  Patient to be independent with initial HEP.    Time  3    Period  Weeks    Status  Achieved    Target Date  07/18/19        PT Long Term Goals - 07/13/19 [id number]      PT LONG TERM GOAL #1   Title  Patient to be independent with advanced HEP.    Time  6    Period  Weeks    Status  On-going      PT LONG TERM GOAL #2   Title  Patient to demonstrate >=4+/5 strength in R LE and >=3+/5 strength in L LE.     Time  6    Period   Weeks    Status  On-going      PT LONG TERM GOAL #3   Title  Patient to demonstrate TUG testing in <14 sec with LRAD to indicate decreased fall risk.    Time  6    Period  Weeks    Status  On-going      PT LONG TERM GOAL #4   Title  Patient to score >44/56 on Berg balance test with use of quad cane to demonstrate decreased fall risk.     Time  6    Period  Weeks    Status  On-going      PT LONG TERM GOAL #5   Title  Patient to demonstrate stair climbing up/down 13 steps with 1 handrail and quad cane with good stability and safe sequencing.     Time  6    Period  Weeks    Status  On-going      PT LONG TERM GOAL #6   Title  Patient to perform 5xSTS in </= 25 sec with use of B armrests in order to decrease risk of falls.    Time  6    Period  Weeks    Status  On-going            Plan - 07/20/19 0914    Clinical Impression Statement  Patient arrived to session emotional d/t recent labs resulting in her having to take a new medication. Worked on gait training with 4WW and SPC with cues for cane sequencing and continuity of stepping. Patient demonstrating lack of heel strike on L LE, thus may benefit from AFO to avoid falls. Still demonstrating considerable instability with SPC, thus advised patient to continue walker use at home. Patient agreeable. Patient required cues to improve toe clearance on L LE with step ups. Demonstrated fair stability but reported hesitancy and fear of falling. Patient with surprisingly good ability to maintain balance with tandem walk today, requiring no UE support to stabilize herself. More difficulty demonstrated with grapevine walk towards L. Patient without complaints at end of session. Progressing well towards goals.    Comorbidities  stroke, OA, migraines, HTN, HLD, depression, anxiety, anemia, ganglion cyst excision    Rehab Potential  Good  PT Treatment/Interventions  ADLs/Self Care Home Management;Cryotherapy;Electrical Stimulation;Moist  Heat;Balance training;Therapeutic exercise;Therapeutic activities;Functional mobility training;Stair training;Gait training;Ultrasound;Neuromuscular re-education;Patient/family education;Manual techniques;Vasopneumatic Device;Taping;Splinting;Energy conservation;Passive range of motion    PT Next Visit Plan  Monitor tolerance to updated HEP; STS transfers, balance training    PT Home Exercise Plan  06/29/19 - Seated gastroc stretch, RW standing march; 07/06/19 - Standing heel raise, STS from chair at counter;  07/18/19 - with chair support or RW:  standing hip abduction (x 5 reps), standing HS curl, RW LE march    Consulted and Agree with Plan of Care  Patient       Patient will benefit from skilled therapeutic intervention in order to improve the following deficits and impairments:  Abnormal gait, Decreased knowledge of precautions, Decreased activity tolerance, Decreased strength, Decreased balance, Decreased mobility, Difficulty walking, Improper body mechanics, Decreased range of motion, Impaired flexibility, Postural dysfunction  Visit Diagnosis: Unsteadiness on feet  Other abnormalities of gait and mobility  Difficulty in walking, not elsewhere classified  Muscle weakness (generalized)     Problem List Patient Active Problem List   Diagnosis Date Noted  . Palpitations 10/12/2018  . Chest discomfort 10/12/2018  . Syncope and collapse 10/07/2018  . Migraine 05/21/2018  . Snoring 05/02/2015  . Essential hypertension 05/02/2015  . Headache 05/01/2015  . Former smoker 01/24/2015  . Left hemiparesis ([facility]) 10/23/2014  . Cardiomyopathy due to hypertension ([place]) 10/23/2014  . Chronic ischemic vertebrobasilar artery thalamic stroke   . Hypertensive heart disease   . Overweight (BMI 25.0-29.9)   . H/O noncompliance with medical treatment, presenting hazards to health   . Aortic regurgitation   . History of ischemic middle cerebral artery stroke embolic   . Hyperlipidemia      Janene Harvey, PT, DPT 07/20/19 9:20 AM   [facility] [address]  [place], [place], [address] Phone: [phone]   Fax:  [phone]  Name: Brandy Roberts MRN: [id number] Date of Birth: 1971/05/13

## 2019-07-20 NOTE — Progress Notes (Signed)
Virtual Visit via Telephone Note Due to national recommendations of social distancing due to [name], telehealth visit is felt to be most appropriate for this patient at this time.  I discussed the limitations, risks, security and privacy concerns of performing an evaluation and management service by telephone and the availability of in person appointments. I also discussed with the patient that there may be a patient responsible charge related to this service. The patient expressed understanding and agreed to proceed.    I connected with Brandy Roberts on 07/20/19  at  10:50 AM EST  EDT by telephone and verified that I am speaking with the correct person using two identifiers.   Consent I discussed the limitations, risks, security and privacy concerns of performing an evaluation and management service by telephone and the availability of in person appointments. I also discussed with the patient that there may be a patient responsible charge related to this service. The patient expressed understanding and agreed to proceed.   Location of Patient: Private Residence    Location of Provider: [place] and [place] participating in Telemedicine visit: Brandy Rankins FNP-BC [place]    History of Present Illness: Telemedicine visit for: Decreased TSH   Recently noted for Decreased TSH by cardiology and instructed to follow-up with me.  Will repeat thyroid panel and if continues to show hyperthyroidism will need to send to endocrinology. She smokes marijuana but not tobacco.  She is requesting to "come off some of all these meds".  Hctz 12.5 mg was recently added by cardiology a few weeks ago due to elevated reading in office.  BP at home today 115/61. I have instructed her to continue to monitor her blood pressure at home which she has not been doing prior to me requesting today.  Lab Results  Component Value Date   TSH 0.342 (L)  07/18/2019   BP Readings from Last 3 Encounters:  07/20/19 (!) 123/58  07/18/19 (!) 100/56  07/18/19 112/70      Past Medical History:  Diagnosis Date  . Anemia   . Anxiety   . Chronic ischemic vertebrobasilar artery thalamic stroke   . Depression   . Hyperlipidemia   . Hypertension   . Migraines   . Nonrheumatic aortic (valve) insufficiency   . Osteoarthritis   . Sleep apnea   . Stroke [facility]) 10/25/2016    Past Surgical History:  Procedure Laterality Date  . CESAREAN SECTION    . GANGLION CYST EXCISION    . TEE WITHOUT CARDIOVERSION N/A 10/09/2014   Procedure: TRANSESOPHAGEAL ECHOCARDIOGRAM (TEE);  Surgeon: Sanda Klein, MD;  Location: [facility] ENDOSCOPY;  Service: Cardiovascular;  Laterality: N/A;  . TONSILLECTOMY      Family History  Problem Relation Age of Onset  . Cancer Mother        type unknown  . Hypertension Mother   . Hypertension Sister   . Diabetes Sister   . Cancer Maternal Aunt        4 aunts died of cancer types unknown    Social History   Socioeconomic History  . Marital status: Single    Spouse name: Not on file  . Number of children: 2  . Years of education: 69  . Highest education level: High school graduate  Occupational History  . Occupation: disable   Tobacco Use  . Smoking status: Current Some Day Smoker    Packs/day: 0.25    Last attempt to quit: 08/31/2017  Years since quitting: 1.8  . Smokeless tobacco: Never Used  . Tobacco comment: Pt. still smoke marijuana.   Substance and Sexual Activity  . Alcohol use: No    Alcohol/week: 0.0 standard drinks  . Drug use: Yes    Types: Marijuana  . Sexual activity: Yes    Birth control/protection: Post-menopausal  Other Topics Concern  . Not on file  Social History Narrative   Patient lives with her uncle in a one story home.  Has 2 sons.  Currently on disability.  Education: high school.   Drinks 1-2 sodas a week       Social Determinants of Health   Financial Resource Strain:   .  Difficulty of Paying Living Expenses: Not on file  Food Insecurity:   . Worried About Charity fundraiser in the Last Year: Not on file  . Ran Out of Food in the Last Year: Not on file  Transportation Needs:   . Lack of Transportation (Medical): Not on file  . Lack of Transportation (Non-Medical): Not on file  Physical Activity:   . Days of Exercise per Week: Not on file  . Minutes of Exercise per Session: Not on file  Stress:   . Feeling of Stress : Not on file  Social Connections:   . Frequency of Communication with Friends and Family: Not on file  . Frequency of Social Gatherings with Friends and Family: Not on file  . Attends Religious Services: Not on file  . Active Member of Clubs or Organizations: Not on file  . Attends Archivist Meetings: Not on file  . Marital Status: Not on file     Observations/Objective: Awake, alert and oriented x 3   Review of Systems  Constitutional: Negative for fever, malaise/fatigue and weight loss.  HENT: Negative.  Negative for nosebleeds.   Eyes: Negative.  Negative for blurred vision, double vision and photophobia.  Respiratory: Negative.  Negative for cough and shortness of breath.   Cardiovascular: Negative.  Negative for chest pain, palpitations and leg swelling.  Gastrointestinal: Negative.  Negative for heartburn, nausea and vomiting.  Musculoskeletal: Negative.  Negative for myalgias.  Neurological: Negative.  Negative for dizziness, focal weakness, seizures and headaches.  Psychiatric/Behavioral: Positive for depression. Negative for suicidal ideas. The patient is nervous/anxious and has insomnia.     Assessment and Plan: Brandy Roberts was seen today for abnormal tsh lab results.  Diagnoses and all orders for this visit:  Low serum thyroid stimulating hormone (TSH) -     Thyroid Panel With TSH; Future     Follow Up Instructions Return in about 3 months (around 10/17/2019).     I discussed the assessment and treatment  plan with the patient. The patient was provided an opportunity to ask questions and all were answered. The patient agreed with the plan and demonstrated an understanding of the instructions.   The patient was advised to call back or seek an in-person evaluation if the symptoms worsen or if the condition fails to improve as anticipated.  I provided 17 minutes of non-face-to-face time during this encounter including median intraservice time, reviewing previous notes, labs, imaging, medications and explaining diagnosis and management.  [name], FNP-BC

## 2019-07-22 ENCOUNTER — Other Ambulatory Visit: Payer: Self-pay | Admitting: Nurse Practitioner

## 2019-07-22 DIAGNOSIS — M21372 Foot drop, left foot: Secondary | ICD-10-CM

## 2019-07-22 DIAGNOSIS — G8194 Hemiplegia, unspecified affecting left nondominant side: Secondary | ICD-10-CM

## 2019-07-22 MED ORDER — MISC. DEVICES MISC: 0 refills | Status: DC

## 2019-07-25 ENCOUNTER — Encounter: Payer: Self-pay | Admitting: Physical Therapy

## 2019-07-25 ENCOUNTER — Other Ambulatory Visit: Payer: Self-pay

## 2019-07-25 ENCOUNTER — Ambulatory Visit: Payer: Medicare Other | Admitting: Physical Therapy

## 2019-07-25 VITALS — BP 146/70 | HR 56

## 2019-07-25 DIAGNOSIS — M6281 Muscle weakness (generalized): Secondary | ICD-10-CM

## 2019-07-25 DIAGNOSIS — R262 Difficulty in walking, not elsewhere classified: Secondary | ICD-10-CM

## 2019-07-25 DIAGNOSIS — R2681 Unsteadiness on feet: Secondary | ICD-10-CM | POA: Diagnosis not present

## 2019-07-25 DIAGNOSIS — R2689 Other abnormalities of gait and mobility: Secondary | ICD-10-CM

## 2019-07-25 NOTE — Therapy (Signed)
[facility] [address]  [place], [place], [address] Phone: [phone]   Fax:  [phone]  Physical Therapy Treatment  Patient Details  Name: Brandy Roberts MRN: [id number] Date of Birth: [birth year] Referring Provider (PT): [name], NP   Encounter Date: 07/25/2019  PT End of Session - 07/25/19 0841    Visit Number  9    Number of Visits  13    Date for PT Re-Evaluation  08/08/19    Authorization Type  UHC Medicare & Medicaid    PT Start Time  0800    PT Stop Time  0843    PT Time Calculation (min)  43 min    Equipment Utilized During Treatment  Gait belt    Activity Tolerance  Patient tolerated treatment well;Patient limited by fatigue;Patient limited by pain    Behavior During Therapy  [facility] for tasks assessed/performed       Past Medical History:  Diagnosis Date  . Anemia   . Anxiety   . Chronic ischemic vertebrobasilar artery thalamic stroke   . Depression   . Hyperlipidemia   . Hypertension   . Migraines   . Nonrheumatic aortic (valve) insufficiency   . Osteoarthritis   . Sleep apnea   . Stroke [facility]) 10/25/2016    Past Surgical History:  Procedure Laterality Date  . CESAREAN SECTION    . GANGLION CYST EXCISION    . TEE WITHOUT CARDIOVERSION N/A 10/09/2014   Procedure: TRANSESOPHAGEAL ECHOCARDIOGRAM (TEE);  Surgeon: Sanda Klein, MD;  Location: [place];  Service: Cardiovascular;  Laterality: N/A;  . TONSILLECTOMY      Vitals:   07/25/19 0802  BP: (!) 146/70  Pulse: (!) 56  SpO2: 96%    Subjective Assessment - 07/25/19 0806    Subjective  Doing well. Feeling weakness in her L side today- notes that this typically occurs when it rains. Has not taken her meds this AM.    Pertinent History  stroke, OA, migraines, HTN, HLD, depression, anxiety, anemia, ganglion cyst excision    Diagnostic tests  10/11/18 brain MRI: No acute intracranial abnormality.Unchanged small chronic  infarcts.    Patient Stated Goals  work on walking without falling    Currently in Pain?  Yes    Pain Score  10-Worst pain ever    Pain Location  Leg    Pain Orientation  Left;Anterior;Posterior    Pain Descriptors / Indicators  Aching    Pain Type  Chronic pain                       [facility] Adult PT Treatment/Exercise - 07/25/19 0001      Knee/Hip Exercises: Stretches   Gastroc Stretch  Right;Left;30 seconds;2 reps    Gastroc Stretch Limitations  sitting with strap and heel on step      Knee/Hip Exercises: Aerobic   Nustep  Lvl 3, 6 min (LE/UE)      Knee/Hip Exercises: Standing   Forward Step Up  Right;Left;1 set;5 reps;Hand Hold: 1;Step Height: 6"    Forward Step Up Limitations  R/L step up/back with 1 UE support on counter top      Knee/Hip Exercises: Seated   Long Arc Quad  Left;10 reps;Strengthening    Long Arc Quad Limitations  cues for slow eccentric lowering    Ball Squeeze  10x5"    Clamshell with TheraBand  Red   x10   Sit to Sand  1 set;10  reps   pushing up slightly from knees              PT Short Term Goals - 07/11/19 0944      PT SHORT TERM GOAL #1   Title  Patient to be independent with initial HEP.    Time  3    Period  Weeks    Status  Achieved    Target Date  07/18/19        PT Long Term Goals - 07/13/19 [id number]      PT LONG TERM GOAL #1   Title  Patient to be independent with advanced HEP.    Time  6    Period  Weeks    Status  On-going      PT LONG TERM GOAL #2   Title  Patient to demonstrate >=4+/5 strength in R LE and >=3+/5 strength in L LE.     Time  6    Period  Weeks    Status  On-going      PT LONG TERM GOAL #3   Title  Patient to demonstrate TUG testing in <14 sec with LRAD to indicate decreased fall risk.    Time  6    Period  Weeks    Status  On-going      PT LONG TERM GOAL #4   Title  Patient to score >44/56 on Berg balance test with use of quad cane to demonstrate decreased fall risk.     Time  6     Period  Weeks    Status  On-going      PT LONG TERM GOAL #5   Title  Patient to demonstrate stair climbing up/down 13 steps with 1 handrail and quad cane with good stability and safe sequencing.     Time  6    Period  Weeks    Status  On-going      PT LONG TERM GOAL #6   Title  Patient to perform 5xSTS in </= 25 sec with use of B armrests in order to decrease risk of falls.    Time  6    Period  Weeks    Status  On-going            Plan - 07/25/19 1035    Clinical Impression Statement  Patient reported increased diffuse weakness and pain over L hemiparetic LE and attributed this to the weather this AM. Worked on step ups with patient demonstrating increased difficulty with initiation and following through with the activity, with hesitation upon stepping down from step with L LE. Demonstrated L knee buckling and freezing-type episode during last rep, requiring manual assistance to bring L foot down from step. Continued with seated ther-ex for rest of session to ensure safety. Patient required intermittent cues to improve eccentric control and increased ROM on L LE with good effort to correct. Reported no complaints at end of session and was escorted out of clinic by staff to ensure safety. Plan to progress per patient's tolerance in future sessions.    Comorbidities  stroke, OA, migraines, HTN, HLD, depression, anxiety, anemia, ganglion cyst excision    Rehab Potential  Good    PT Treatment/Interventions  ADLs/Self Care Home Management;Cryotherapy;Electrical Stimulation;Moist Heat;Balance training;Therapeutic exercise;Therapeutic activities;Functional mobility training;Stair training;Gait training;Ultrasound;Neuromuscular re-education;Patient/family education;Manual techniques;Vasopneumatic Device;Taping;Splinting;Energy conservation;Passive range of motion    PT Next Visit Plan  Monitor tolerance to updated HEP; STS transfers, balance training    PT Home Exercise Plan  06/29/19 -  Seated gastroc stretch, RW standing march; 07/06/19 - Standing heel raise, STS from chair at counter;  07/18/19 - with chair support or RW:  standing hip abduction (x 5 reps), standing HS curl, RW LE march    Consulted and Agree with Plan of Care  Patient       Patient will benefit from skilled therapeutic intervention in order to improve the following deficits and impairments:  Abnormal gait, Decreased knowledge of precautions, Decreased activity tolerance, Decreased strength, Decreased balance, Decreased mobility, Difficulty walking, Improper body mechanics, Decreased range of motion, Impaired flexibility, Postural dysfunction  Visit Diagnosis: Unsteadiness on feet  Other abnormalities of gait and mobility  Difficulty in walking, not elsewhere classified  Muscle weakness (generalized)     Problem List Patient Active Problem List   Diagnosis Date Noted  . Palpitations 10/12/2018  . Chest discomfort 10/12/2018  . Syncope and collapse 10/07/2018  . Migraine 05/21/2018  . Snoring 05/02/2015  . Essential hypertension 05/02/2015  . Headache 05/01/2015  . Former smoker 01/24/2015  . Left hemiparesis ([place]) 10/23/2014  . Cardiomyopathy due to hypertension ([place]) 10/23/2014  . Chronic ischemic vertebrobasilar artery thalamic stroke   . Hypertensive heart disease   . Overweight (BMI 25.0-29.9)   . H/O noncompliance with medical treatment, presenting hazards to health   . Aortic regurgitation   . History of ischemic middle cerebral artery stroke embolic   . Hyperlipidemia      Janene Harvey, PT, DPT 07/25/19 10:39 AM   [facility] [address]  [place], [place], [address] Phone: [phone]   Fax:  [phone]  Name: Aliyyah Trenkamp MRN: [id number] Date of Birth: 09/08/1970

## 2019-07-27 ENCOUNTER — Ambulatory Visit: Payer: Medicare Other | Admitting: Physical Therapy

## 2019-07-28 ENCOUNTER — Encounter: Payer: Self-pay | Admitting: Physical Therapy

## 2019-07-28 ENCOUNTER — Other Ambulatory Visit: Payer: Self-pay

## 2019-07-28 ENCOUNTER — Ambulatory Visit: Payer: Medicare Other | Admitting: Physical Therapy

## 2019-07-28 VITALS — BP 118/58 | HR 58

## 2019-07-28 DIAGNOSIS — R262 Difficulty in walking, not elsewhere classified: Secondary | ICD-10-CM

## 2019-07-28 DIAGNOSIS — R2681 Unsteadiness on feet: Secondary | ICD-10-CM

## 2019-07-28 DIAGNOSIS — M6281 Muscle weakness (generalized): Secondary | ICD-10-CM

## 2019-07-28 DIAGNOSIS — R2689 Other abnormalities of gait and mobility: Secondary | ICD-10-CM

## 2019-07-28 NOTE — Therapy (Signed)
[facility] [address]  [place], [place], [address] Phone: [phone]   Fax:  [phone]  Physical Therapy Progress Note  Patient Details  Name: Brandy Roberts MRN: [id number] Date of Birth: 1970/08/01 Referring Provider (PT): [name], NP   Progress Note Reporting Period 06/27/19 to 07/28/19  See note below for Objective Data and Assessment of Progress/Goals.     Encounter Date: 07/28/2019  PT End of Session - 07/28/19 1118    Visit Number  10    Number of Visits  18    Date for PT Re-Evaluation  08/25/19    Authorization Type  UHC Medicare & Medicaid    PT Start Time  [phone]    PT Stop Time  0932    PT Time Calculation (min)  46 min    Equipment Utilized During Treatment  Gait belt    Activity Tolerance  Patient tolerated treatment well;Patient limited by fatigue    Behavior During Therapy  WFL for tasks assessed/performed       Past Medical History:  Diagnosis Date  . Anemia   . Anxiety   . Chronic ischemic vertebrobasilar artery thalamic stroke   . Depression   . Hyperlipidemia   . Hypertension   . Migraines   . Nonrheumatic aortic (valve) insufficiency   . Osteoarthritis   . Sleep apnea   . Stroke [facility]) 10/25/2016    Past Surgical History:  Procedure Laterality Date  . CESAREAN SECTION    . GANGLION CYST EXCISION    . TEE WITHOUT CARDIOVERSION N/A 10/09/2014   Procedure: TRANSESOPHAGEAL ECHOCARDIOGRAM (TEE);  Surgeon: Sanda Klein, MD;  Location: [place];  Service: Cardiovascular;  Laterality: N/A;  . TONSILLECTOMY      Vitals:   07/28/19 0850  BP: (!) 118/58  Pulse: (!) 58  SpO2: 97%    Subjective Assessment - 07/28/19 0852    Subjective  Doing well. Feeling better than last session. Denies dizziness or lightheadedness today, but was having dizziness a couple days ago. This went away withing 30 minutes without residual effects. Notes that she feels more stable on her  feet- 90% improvement.l    Pertinent History  stroke, OA, migraines, HTN, HLD, depression, anxiety, anemia, ganglion cyst excision    Diagnostic tests  10/11/18 brain MRI: No acute intracranial abnormality.Unchanged small chronic infarcts.    Patient Stated Goals  work on walking without falling    Currently in Pain?  No/denies         [facility] PT Assessment - 07/28/19 0001      Assessment   Medical Diagnosis  Frequent Falls    Referring Provider (PT)  [name], NP    Onset Date/Surgical Date  06/26/18      Strength   Right Hip Flexion  5/5    Right Hip ABduction  4+/5    Right Hip ADduction  4+/5    Left Hip Flexion  3+/5    Left Hip ABduction  3+/5    Left Hip ADduction  3+/5    Right Knee Flexion  5/5    Right Knee Extension  5/5    Left Knee Flexion  3/5    Left Knee Extension  3/5    Right Ankle Dorsiflexion  4/5    Right Ankle Plantar Flexion  4/5    Left Ankle Dorsiflexion  2/5    Left Ankle Plantar Flexion  2+/5      Standardized Balance Assessment  Standardized Balance Assessment  Five Times Sit to Stand;Berg Balance Test    Five times sit to stand comments   24.5   pushing off from knees     Berg Balance Test   Sit to Stand  Able to stand  independently using hands    Standing Unsupported  Able to stand safely 2 minutes    Sitting with Back Unsupported but Feet Supported on Floor or Stool  Able to sit safely and securely 2 minutes    Stand to Sit  Sits safely with minimal use of hands    Transfers  Able to transfer safely, minor use of hands    Standing Unsupported with Eyes Closed  Able to stand 10 seconds with supervision    Standing Unsupported with Feet Together  Able to place feet together independently and stand for 1 minute with supervision    From Standing, Reach Forward with Outstretched Arm  Can reach confidently >25 cm (10")    From Standing Position, Pick up Object from [place] to pick up shoe safely and easily    From Standing Position,  Turn to Look Behind Over each Shoulder  Looks behind from both sides and weight shifts well    Turn 360 Degrees  Able to turn 360 degrees safely but slowly    Standing Unsupported, Alternately Place Feet on Step/Stool  Able to stand independently and complete 8 steps >20 seconds    Standing Unsupported, One Foot in Front  Able to plae foot ahead of the other independently and hold 30 seconds    Standing on One Leg  Tries to lift leg/unable to hold 3 seconds but remains standing independently    Total Score  46      Timed Up and Go Test   Normal TUG (seconds)  16.24   with rollator                  [facility] Adult PT Treatment/Exercise - 07/28/19 0001      Knee/Hip Exercises: Aerobic   Nustep  Lvl 3, 6 min (LE/UE)             PT Education - 07/28/19 1118    Education Details  discussion on objective progress and remaining impairments; advised patient to continue working on HEP for max benefit    Person(s) Educated  Patient    Methods  Explanation;Demonstration;Tactile cues;Verbal cues    Comprehension  Verbalized understanding;Returned demonstration       PT Short Term Goals - 07/28/19 0900      PT SHORT TERM GOAL #1   Title  Patient to be independent with initial HEP.    Time  3    Period  Weeks    Status  Achieved    Target Date  07/18/19        PT Long Term Goals - 07/28/19 0900      PT LONG TERM GOAL #1   Title  Patient to be independent with advanced HEP.    Time  4    Period  Weeks    Status  Partially Met   met for current; compliance varied d/t fatigue   Target Date  08/25/19      PT LONG TERM GOAL #2   Title  Patient to demonstrate >=4+/5 strength in R LE and >=3+/5 strength in L LE.     Time  4    Period  Weeks    Status  Partially Met   improvement  in B hip flexion, abduction, adduction, and R knee flexion/extension. Distal L LE strength still most limiting.   Target Date  08/25/19      PT LONG TERM GOAL #3   Title  Patient to  demonstrate TUG testing in <14 sec with LRAD to indicate decreased fall risk.    Time  4    Period  Weeks    Status  On-going   16.24 sec with 4WW   Target Date  08/25/19      PT LONG TERM GOAL #4   Title  Patient to score >44/56 on Berg balance test with use of quad cane to demonstrate decreased fall risk.     Time  4    Period  Weeks    Status  Achieved   46/56 with 0JJ   Target Date  08/25/19      PT LONG TERM GOAL #5   Title  Patient to demonstrate stair climbing up/down 13 steps with 1 handrail and quad cane with good stability and safe sequencing.     Time  4    Period  Weeks    Status  Deferred   not assessed d/t safety and fatigue   Target Date  08/25/19      PT LONG TERM GOAL #6   Title  Patient to perform 5xSTS in </= 25 sec with use of B armrests in order to decrease risk of falls.    Time  4    Period  Weeks    Status  On-going    Target Date  08/25/19            Plan - 07/28/19 1119    Clinical Impression Statement  Patient reporting 90% improvement in stability since initial eval. Notes that she is feeling better since last session, but did have onset of dizziness a couple days ago which has since resolved. Strength testing today revealed improvement in B hip flexion, abduction, adduction, and R knee flexion/extension. Distal L LE strength still most limiting. Demonstrated considerable improvement in 5xSTS speed, with ability to use less UE support. Patient has met her goal for BERG today, now scoring 46/56 without use of AD. Still demonstrating difficulty maintaining good gait speed with TUG using 4WW. Stairs not assessed today d/t safety and fatigue. Plan to continue addressing stairs in future sessions depending on patient's tolerance and energy level. Patient is demonstrating excellent progress with therapy. Would continue to benefit from skilled PT services 2x/week for 4 weeks to address remaining goals.    Comorbidities  stroke, OA, migraines, HTN, HLD,  depression, anxiety, anemia, ganglion cyst excision    Rehab Potential  Good    PT Frequency  2x / week    PT Duration  4 weeks    PT Treatment/Interventions  ADLs/Self Care Home Management;Cryotherapy;Electrical Stimulation;Moist Heat;Balance training;Therapeutic exercise;Therapeutic activities;Functional mobility training;Stair training;Gait training;Ultrasound;Neuromuscular re-education;Patient/family education;Manual techniques;Vasopneumatic Device;Taping;Splinting;Energy conservation;Passive range of motion    PT Next Visit Plan  Monitor tolerance to updated HEP; STS transfers, balance training, stairs with quad or SPC    PT Home Exercise Plan  06/29/19 - Seated gastroc stretch, RW standing march; 07/06/19 - Standing heel raise, STS from chair at counter;  07/18/19 - with chair support or RW:  standing hip abduction (x 5 reps), standing HS curl, RW LE march    Consulted and Agree with Plan of Care  Patient       Patient will benefit from skilled therapeutic intervention in order to improve the following  deficits and impairments:  Abnormal gait, Decreased knowledge of precautions, Decreased activity tolerance, Decreased strength, Decreased balance, Decreased mobility, Difficulty walking, Improper body mechanics, Decreased range of motion, Impaired flexibility, Postural dysfunction  Visit Diagnosis: Unsteadiness on feet  Other abnormalities of gait and mobility  Difficulty in walking, not elsewhere classified  Muscle weakness (generalized)     Problem List Patient Active Problem List   Diagnosis Date Noted  . Palpitations 10/12/2018  . Chest discomfort 10/12/2018  . Syncope and collapse 10/07/2018  . Migraine 05/21/2018  . Snoring 05/02/2015  . Essential hypertension 05/02/2015  . Headache 05/01/2015  . Former smoker 01/24/2015  . Left hemiparesis ([place]) 10/23/2014  . Cardiomyopathy due to hypertension ([place]) 10/23/2014  . Chronic ischemic vertebrobasilar artery thalamic stroke    . Hypertensive heart disease   . Overweight (BMI 25.0-29.9)   . H/O noncompliance with medical treatment, presenting hazards to health   . Aortic regurgitation   . History of ischemic middle cerebral artery stroke embolic   . Hyperlipidemia       Janene Harvey, PT, DPT 07/28/19 11:23 AM   [facility] [address]  [place], [place], [address] Phone: [phone]   Fax:  [phone]  Name: Brandy Roberts MRN: [id number] Date of Birth: 1971-03-11

## 2019-08-02 ENCOUNTER — Ambulatory Visit: Payer: Medicare Other

## 2019-08-04 ENCOUNTER — Ambulatory Visit: Payer: Medicare Other

## 2019-08-04 ENCOUNTER — Other Ambulatory Visit: Payer: Medicare Other

## 2019-08-04 ENCOUNTER — Other Ambulatory Visit: Payer: Self-pay

## 2019-08-04 ENCOUNTER — Ambulatory Visit: Payer: Medicare Other | Attending: Nurse Practitioner

## 2019-08-04 DIAGNOSIS — R7989 Other specified abnormal findings of blood chemistry: Secondary | ICD-10-CM | POA: Diagnosis not present

## 2019-08-05 ENCOUNTER — Other Ambulatory Visit: Payer: Self-pay | Admitting: Nurse Practitioner

## 2019-08-05 ENCOUNTER — Ambulatory Visit: Payer: Medicare Other | Attending: Nurse Practitioner

## 2019-08-05 VITALS — BP 132/76 | HR 54

## 2019-08-05 DIAGNOSIS — R2681 Unsteadiness on feet: Secondary | ICD-10-CM | POA: Diagnosis not present

## 2019-08-05 DIAGNOSIS — G4709 Other insomnia: Secondary | ICD-10-CM

## 2019-08-05 DIAGNOSIS — R262 Difficulty in walking, not elsewhere classified: Secondary | ICD-10-CM | POA: Insufficient documentation

## 2019-08-05 DIAGNOSIS — M6281 Muscle weakness (generalized): Secondary | ICD-10-CM | POA: Diagnosis not present

## 2019-08-05 DIAGNOSIS — R2689 Other abnormalities of gait and mobility: Secondary | ICD-10-CM | POA: Insufficient documentation

## 2019-08-05 DIAGNOSIS — I10 Essential (primary) hypertension: Secondary | ICD-10-CM

## 2019-08-05 LAB — THYROID PANEL WITH TSH
Free Thyroxine Index: 2.1 (ref 1.2–4.9)
T3 Uptake Ratio: 27 % (ref 24–39)
T4, Total: 7.6 ug/dL (ref 4.5–12.0)
TSH: 1.67 u[IU]/mL (ref 0.450–4.500)

## 2019-08-05 NOTE — Therapy (Signed)
[facility] [address]  [place], [place], [address] Phone: [phone]   Fax:  [phone]  Physical Therapy Treatment  Patient Details  Name: Brandy Roberts MRN: [id number] Date of Birth: 1970-11-08 Referring Provider (PT): [name], NP   Encounter Date: 08/05/2019  PT End of Session - 08/05/19 0836    Visit Number  11    Number of Visits  18    Date for PT Re-Evaluation  08/25/19    Authorization Type  [employer] Medicare & Medicaid    PT Start Time  0820   Pt, arrived late to session   PT Stop Time  0850    PT Time Calculation (min)  30 min    Equipment Utilized During Treatment  --    Activity Tolerance  Patient tolerated treatment well;Patient limited by fatigue    Behavior During Therapy  [facility] for tasks assessed/performed       Past Medical History:  Diagnosis Date  . Anemia   . Anxiety   . Chronic ischemic vertebrobasilar artery thalamic stroke   . Depression   . Hyperlipidemia   . Hypertension   . Migraines   . Nonrheumatic aortic (valve) insufficiency   . Osteoarthritis   . Sleep apnea   . Stroke [facility]) 10/25/2016    Past Surgical History:  Procedure Laterality Date  . CESAREAN SECTION    . GANGLION CYST EXCISION    . TEE WITHOUT CARDIOVERSION N/A 10/09/2014   Procedure: TRANSESOPHAGEAL ECHOCARDIOGRAM (TEE);  Surgeon: Sanda Klein, MD;  Location: [facility];  Service: Cardiovascular;  Laterality: N/A;  . TONSILLECTOMY      Vitals:   08/05/19 0832 08/05/19 1129  BP: 118/68 132/76  Pulse: (!) 54 (!) 54  SpO2: 97% 97%    Subjective Assessment - 08/05/19 0832    Subjective  Pt. reporting a fall onto carpet on Monday in her bedroom.  "as soon as I woke up and stood up out of my bed I just fell." pt. reporting she fell forward on her knees in her bedroom on carpet and unsure if she hit her head.    Pertinent History  stroke, OA, migraines, HTN, HLD, depression, anxiety, anemia, ganglion  cyst excision    Diagnostic tests  10/11/18 brain MRI: No acute intracranial abnormality.Unchanged small chronic infarcts.    Patient Stated Goals  work on walking without falling    Currently in Pain?  Yes    Pain Score  8     Pain Location  Leg    Pain Orientation  Left;Anterior;Posterior    Pain Descriptors / Indicators  Sharp    Pain Type  Chronic pain    Aggravating Factors   Unsure                       [facility] Adult PT Treatment/Exercise - 08/05/19 0001      Self-Care   Self-Care  Other Self-Care Comments    Other Self-Care Comments   Discussion of pt. fall on Monday landing on B knees on carpet in home; inquiring wheteher pt. hit her head on fall; pt. unsure if she hit her head stating she "blacked out" - pt. noting she layed in floor for 15 min then was able to stand up and get back in bed      Knee/Hip Exercises: Aerobic   Nustep  Lvl 1, 6 min (LE/UE)   less resistance due to monitoring knee pain  Knee/Hip Exercises: Seated   Sit to Sand  2 sets;5 reps   L LE trimmer throughout               PT Short Term Goals - 07/28/19 0900      PT SHORT TERM GOAL #1   Title  Patient to be independent with initial HEP.    Time  3    Period  Weeks    Status  Achieved    Target Date  07/18/19        PT Long Term Goals - 07/28/19 0900      PT LONG TERM GOAL #1   Title  Patient to be independent with advanced HEP.    Time  4    Period  Weeks    Status  Partially Met   met for current; compliance varied d/t fatigue   Target Date  08/25/19      PT LONG TERM GOAL #2   Title  Patient to demonstrate >=4+/5 strength in R LE and >=3+/5 strength in L LE.     Time  4    Period  Weeks    Status  Partially Met   improvement in B hip flexion, abduction, adduction, and R knee flexion/extension. Distal L LE strength still most limiting.   Target Date  08/25/19      PT LONG TERM GOAL #3   Title  Patient to demonstrate TUG testing in <14 sec with LRAD to  indicate decreased fall risk.    Time  4    Period  Weeks    Status  On-going   16.24 sec with 4WW   Target Date  08/25/19      PT LONG TERM GOAL #4   Title  Patient to score >44/56 on Berg balance test with use of quad cane to demonstrate decreased fall risk.     Time  4    Period  Weeks    Status  Achieved   46/56 with 6OQ   Target Date  08/25/19      PT LONG TERM GOAL #5   Title  Patient to demonstrate stair climbing up/down 13 steps with 1 handrail and quad cane with good stability and safe sequencing.     Time  4    Period  Weeks    Status  Deferred   not assessed d/t safety and fatigue   Target Date  08/25/19      PT LONG TERM GOAL #6   Title  Patient to perform 5xSTS in </= 25 sec with use of B armrests in order to decrease risk of falls.    Time  4    Period  Weeks    Status  On-going    Target Date  08/25/19            Plan - 08/05/19 0837    Clinical Impression Statement  Brandy Roberts reporting she fell on Monday of this week after rising in morning out of bed and "blacking out".  Pt. reporting she landed on B knees forward and unsure if she hit her head on fall.  Pt. remained on floor for 15 min then was able to stand and get back in bed.  Pt. reporting today that she has B knee bruising however denied other pains today.  Pt. did present after warmup with L LE tremor which is unusual for her with sit<>stand therex.  Pt. arrived 20 min late to session thus treatment time limited.  Pt.  reporting [facility] transportation service arrived late to pick her up and went to wrong apartment building.  Pt. with complaint at end of session of onset of migraine and reports she has not administered her self-migraine injection over the past few months without reason.  Vitals stable to end session and pt. accompanied by secondary PT downstairs to meet transportation service.    Comorbidities  stroke, OA, migraines, HTN, HLD, depression, anxiety, anemia, ganglion cyst excision    Rehab  Potential  Good    PT Treatment/Interventions  ADLs/Self Care Home Management;Cryotherapy;Electrical Stimulation;Moist Heat;Balance training;Therapeutic exercise;Therapeutic activities;Functional mobility training;Stair training;Gait training;Ultrasound;Neuromuscular re-education;Patient/family education;Manual techniques;Vasopneumatic Device;Taping;Splinting;Energy conservation;Passive range of motion    PT Next Visit Plan  Monitor tolerance to updated HEP; STS transfers, balance training, stairs with quad or SPC    PT Home Exercise Plan  06/29/19 - Seated gastroc stretch, RW standing march; 07/06/19 - Standing heel raise, STS from chair at counter;  07/18/19 - with chair support or RW:  standing hip abduction (x 5 reps), standing HS curl, RW LE march    Consulted and Agree with Plan of Care  Patient       Patient will benefit from skilled therapeutic intervention in order to improve the following deficits and impairments:  Abnormal gait, Decreased knowledge of precautions, Decreased activity tolerance, Decreased strength, Decreased balance, Decreased mobility, Difficulty walking, Improper body mechanics, Decreased range of motion, Impaired flexibility, Postural dysfunction  Visit Diagnosis: Unsteadiness on feet  Other abnormalities of gait and mobility  Difficulty in walking, not elsewhere classified  Muscle weakness (generalized)     Problem List Patient Active Problem List   Diagnosis Date Noted  . Palpitations 10/12/2018  . Chest discomfort 10/12/2018  . Syncope and collapse 10/07/2018  . Migraine 05/21/2018  . Snoring 05/02/2015  . Essential hypertension 05/02/2015  . Headache 05/01/2015  . Former smoker 01/24/2015  . Left hemiparesis ([place]) 10/23/2014  . Cardiomyopathy due to hypertension ([place]) 10/23/2014  . Chronic ischemic vertebrobasilar artery thalamic stroke   . Hypertensive heart disease   . Overweight (BMI 25.0-29.9)   . H/O noncompliance with medical treatment,  presenting hazards to health   . Aortic regurgitation   . History of ischemic middle cerebral artery stroke embolic   . Hyperlipidemia     Bess Harvest, PTA 08/05/19 11:46 AM   [facility] [address]  [place], [place], [address] Phone: [phone]   Fax:  [phone]  Name: Brandy Roberts MRN: [id number] Date of Birth: 1971-03-07

## 2019-08-07 DIAGNOSIS — I639 Cerebral infarction, unspecified: Secondary | ICD-10-CM | POA: Diagnosis not present

## 2019-08-08 ENCOUNTER — Ambulatory Visit: Payer: Medicare Other | Admitting: Physical Therapy

## 2019-08-12 ENCOUNTER — Telehealth: Payer: Self-pay

## 2019-08-12 MED ORDER — MISC. DEVICES MISC: 0 refills | Status: DC

## 2019-08-12 NOTE — Telephone Encounter (Signed)
-----   Message from [name], NP sent at 07/22/2019 12:34 PM EST ----- Regarding: RE: AFO Prescription Request Thank you! I have ordered this for her ----- Message ----- From: June Leap, PT Sent: 07/20/2019   9:24 AM EST To: [name], NP Subject: AFO Prescription Request                       Good Morning,  I am treating Ms. Lamons for hx of falls and imbalance here in outpatient PT. I noticed that d/t her hx of stoke and resultant L sided weakness, she's demonstrating considerable foot drop on that L side. I believe that she would really benefit from an AFO as this would address one of her fall risk factors. If you agree with this recommendation, the patient needs a prescription sent to the orthotic clinic of your choice. Please let me know if there is anything I can do to help.  Thanks!  Janene Harvey, PT, DPT 07/20/19 9:28 AM

## 2019-08-12 NOTE — Telephone Encounter (Signed)
Faxed orders to [facility].

## 2019-08-15 ENCOUNTER — Other Ambulatory Visit: Payer: Self-pay

## 2019-08-15 ENCOUNTER — Ambulatory Visit: Payer: Medicare Other

## 2019-08-15 VITALS — BP 115/70

## 2019-08-15 DIAGNOSIS — R2681 Unsteadiness on feet: Secondary | ICD-10-CM

## 2019-08-15 DIAGNOSIS — M6281 Muscle weakness (generalized): Secondary | ICD-10-CM | POA: Diagnosis not present

## 2019-08-15 DIAGNOSIS — R2689 Other abnormalities of gait and mobility: Secondary | ICD-10-CM | POA: Diagnosis not present

## 2019-08-15 DIAGNOSIS — R262 Difficulty in walking, not elsewhere classified: Secondary | ICD-10-CM

## 2019-08-15 NOTE — Therapy (Signed)
[facility] [address]  [place], [place], [address] Phone: [phone]   Fax:  [phone]  Physical Therapy Treatment  Patient Details  Name: Brandy Roberts MRN: [id number] Date of Birth: [birth year] Referring Provider (PT): [name], NP   Encounter Date: 08/15/2019  PT End of Session - 08/15/19 0929    Visit Number  12    Number of Visits  18    Date for PT Re-Evaluation  08/25/19    Authorization Type  [employer]HC Medicare & Medicaid    PT Start Time  0929    PT Stop Time  1018    PT Time Calculation (min)  49 min    Activity Tolerance  Patient tolerated treatment well;Patient limited by fatigue    Behavior During Therapy  [facility] for tasks assessed/performed       Past Medical History:  Diagnosis Date  . Anemia   . Anxiety   . Chronic ischemic vertebrobasilar artery thalamic stroke   . Depression   . Hyperlipidemia   . Hypertension   . Migraines   . Nonrheumatic aortic (valve) insufficiency   . Osteoarthritis   . Sleep apnea   . Stroke [facility]) 10/25/2016    Past Surgical History:  Procedure Laterality Date  . CESAREAN SECTION    . GANGLION CYST EXCISION    . TEE WITHOUT CARDIOVERSION N/A 10/09/2014   Procedure: TRANSESOPHAGEAL ECHOCARDIOGRAM (TEE);  Surgeon: Sanda Klein, MD;  Location: [facility];  Service: Cardiovascular;  Laterality: N/A;  . TONSILLECTOMY      Vitals:   08/15/19 1033  BP: 115/70    Subjective Assessment - 08/15/19 0938    Subjective  Pt reports having 2 falls 2x within the last week at home, both instances occurred when pt was not using rollator, once tripping over throw rug and once fall. She does not know why she is falling.    Pertinent History  stroke, OA, migraines, HTN, HLD, depression, anxiety, anemia, ganglion cyst excision    Diagnostic tests  10/11/18 brain MRI: No acute intracranial abnormality.Unchanged small chronic infarcts.    Patient Stated Goals  work on  walking without falling    Currently in Pain?  No/denies                       [facility] Adult PT Treatment/Exercise - 08/15/19 0001      Ambulation/Gait   Ambulation Distance (Feet)  50 Feet    Assistive device  Rollator    Gait Pattern  Step-through pattern    Ambulation Surface  Level    Gait Comments  decreased L heel strike and L step height, decreased R step length       Knee/Hip Exercises: Aerobic   Nustep  Lvl 1, 6 min (LE/UE)      Knee/Hip Exercises: Standing   SLS  Tandem x 30" B; SLS 2 x 20" L LE only      Knee/Hip Exercises: Seated   Long Arc Quad  AROM;Strengthening;Right;Left;2 sets;10 reps   decr TKE L knee   Sit to Sand  2 sets;10 reps;Other (comment)   eccentric     Knee/Hip Exercises: Supine   Quad Sets  AROM;Right;Left;1 set;15 reps;Other (comment)    Quad Sets Limitations  --   3 second holds, required towel roll under knee to incr VMO   Short Arc Quad Sets  AROM;Right;Left;1 set;10 reps;Other (comment)    Short Arc Quad Sets Limitations  foam  roller under knees    Bridges  AROM;Strengthening;Both;1 set;15 reps;Other (comment)    Bridges Limitations  manually approximated knees and ankles, v.c.'s for L glute focus             PT Education - 08/15/19 1020    Education Details  Pt educated on performing sit to stand slowly to allow to equilibration of blood pressure, counting to at least 10 before walking. Pt additionally educated on removing tripping hazards, such as rugs from household.    Person(s) Educated  Patient    Methods  Explanation;Demonstration    Comprehension  Verbalized understanding       PT Short Term Goals - 07/28/19 0900      PT SHORT TERM GOAL #1   Title  Patient to be independent with initial HEP.    Time  3    Period  Weeks    Status  Achieved    Target Date  07/18/19        PT Long Term Goals - 07/28/19 0900      PT LONG TERM GOAL #1   Title  Patient to be independent with advanced HEP.    Time  4     Period  Weeks    Status  Partially Met   met for current; compliance varied d/t fatigue   Target Date  08/25/19      PT LONG TERM GOAL #2   Title  Patient to demonstrate >=4+/5 strength in R LE and >=3+/5 strength in L LE.     Time  4    Period  Weeks    Status  Partially Met   improvement in B hip flexion, abduction, adduction, and R knee flexion/extension. Distal L LE strength still most limiting.   Target Date  08/25/19      PT LONG TERM GOAL #3   Title  Patient to demonstrate TUG testing in <14 sec with LRAD to indicate decreased fall risk.    Time  4    Period  Weeks    Status  On-going   16.24 sec with 4WW   Target Date  08/25/19      PT LONG TERM GOAL #4   Title  Patient to score >44/56 on Berg balance test with use of quad cane to demonstrate decreased fall risk.     Time  4    Period  Weeks    Status  Achieved   46/56 with 8MV   Target Date  08/25/19      PT LONG TERM GOAL #5   Title  Patient to demonstrate stair climbing up/down 13 steps with 1 handrail and quad cane with good stability and safe sequencing.     Time  4    Period  Weeks    Status  Deferred   not assessed d/t safety and fatigue   Target Date  08/25/19      PT LONG TERM GOAL #6   Title  Patient to perform 5xSTS in </= 25 sec with use of B armrests in order to decrease risk of falls.    Time  4    Period  Weeks    Status  On-going    Target Date  08/25/19            Plan - 08/15/19 1022    Clinical Impression Statement  Pt presents s/p 2 more falls this week in the home, one instance likely from orthostatic hypotension and the other likely from L  LE fatigue. Pt's L LE continues to be significantly limited by weakness causing foot drag in gait as pt fatigues. Pt unable to activate VMO in supine without towel roll posterior to knee and unable to achieve TKE with SAQ/LAQ requiring AAROM. Pt tolerated treatment well with focus on TKE, quad strengthening, glute strengthening and functional  eccentric sit <> stands prior to static balance standing at rollator. Pt educated on slower sit to stands and maintaining standing for at least 10 seconds before ambulating to prevent any falls related to orthostatic hypotension.    Rehab Potential  Good    PT Frequency  2x / week    PT Duration  4 weeks    PT Treatment/Interventions  ADLs/Self Care Home Management;Cryotherapy;Electrical Stimulation;Moist Heat;Balance training;Therapeutic exercise;Therapeutic activities;Functional mobility training;Stair training;Gait training;Ultrasound;Neuromuscular re-education;Patient/family education;Manual techniques;Vasopneumatic Device;Taping;Splinting;Energy conservation;Passive range of motion    PT Next Visit Plan  check in orthostatic hypotension and any falls, continued L quad/glute strengthening progressed to standing TKE with band resistance, low forward/side step ups for stairs training, progressing SLS to be dynamic, if tolerated.    PT Home Exercise Plan  06/29/19 - Seated gastroc stretch, RW standing march; 07/06/19 - Standing heel raise, STS from chair at counter;  07/18/19 - with chair support or RW:  standing hip abduction (x 5 reps), standing HS curl, RW LE march    Consulted and Agree with Plan of Care  Patient       Patient will benefit from skilled therapeutic intervention in order to improve the following deficits and impairments:  Abnormal gait, Decreased knowledge of precautions, Decreased activity tolerance, Decreased strength, Decreased balance, Decreased mobility, Difficulty walking, Improper body mechanics, Decreased range of motion, Impaired flexibility, Postural dysfunction  Visit Diagnosis: Unsteadiness on feet  Other abnormalities of gait and mobility  Difficulty in walking, not elsewhere classified  Muscle weakness (generalized)     Problem List Patient Active Problem List   Diagnosis Date Noted  . Palpitations 10/12/2018  . Chest discomfort 10/12/2018  . Syncope  and collapse 10/07/2018  . Migraine 05/21/2018  . Snoring 05/02/2015  . Essential hypertension 05/02/2015  . Headache 05/01/2015  . Former smoker 01/24/2015  . Left hemiparesis ([place]) 10/23/2014  . Cardiomyopathy due to hypertension ([place]) 10/23/2014  . Chronic ischemic vertebrobasilar artery thalamic stroke   . Hypertensive heart disease   . Overweight (BMI 25.0-29.9)   . H/O noncompliance with medical treatment, presenting hazards to health   . Aortic regurgitation   . History of ischemic middle cerebral artery stroke embolic   . Hyperlipidemia     Brandy Roberts, PT, DPT 08/15/2019, 10:34 AM  [facility] [address]  [place], [place], [address] Phone: [phone]   Fax:  [phone]  Name: Brandy Roberts MRN: [id number] Date of Birth: 10-Nov-1970

## 2019-08-17 ENCOUNTER — Encounter: Payer: Self-pay | Admitting: Physical Therapy

## 2019-08-17 ENCOUNTER — Other Ambulatory Visit: Payer: Self-pay

## 2019-08-17 ENCOUNTER — Ambulatory Visit: Payer: Medicare Other | Admitting: Physical Therapy

## 2019-08-17 VITALS — BP 113/70 | HR 74

## 2019-08-17 DIAGNOSIS — R2681 Unsteadiness on feet: Secondary | ICD-10-CM

## 2019-08-17 DIAGNOSIS — M6281 Muscle weakness (generalized): Secondary | ICD-10-CM | POA: Diagnosis not present

## 2019-08-17 DIAGNOSIS — R2689 Other abnormalities of gait and mobility: Secondary | ICD-10-CM | POA: Diagnosis not present

## 2019-08-17 DIAGNOSIS — R262 Difficulty in walking, not elsewhere classified: Secondary | ICD-10-CM | POA: Diagnosis not present

## 2019-08-17 NOTE — Therapy (Signed)
[facility] [address]  [place], [place], [address] Phone: [phone]   Fax:  [phone]  Physical Therapy Treatment  Patient Details  Name: Brandy Roberts MRN: [id number] Date of Birth: Mar 30, 1971 Referring Provider (PT): [name], NP   Encounter Date: 08/17/2019  PT End of Session - 08/17/19 0843    Visit Number  13    Number of Visits  18    Date for PT Re-Evaluation  08/25/19    Authorization Type  [employer] Medicare & Medicaid    PT Start Time  0758    PT Stop Time  0842    PT Time Calculation (min)  44 min    Equipment Utilized During Treatment  Gait belt    Activity Tolerance  Patient tolerated treatment well    Behavior During Therapy  [facility] for tasks assessed/performed       Past Medical History:  Diagnosis Date  . Anemia   . Anxiety   . Chronic ischemic vertebrobasilar artery thalamic stroke   . Depression   . Hyperlipidemia   . Hypertension   . Migraines   . Nonrheumatic aortic (valve) insufficiency   . Osteoarthritis   . Sleep apnea   . Stroke [facility]) 10/25/2016    Past Surgical History:  Procedure Laterality Date  . CESAREAN SECTION    . GANGLION CYST EXCISION    . TEE WITHOUT CARDIOVERSION N/A 10/09/2014   Procedure: TRANSESOPHAGEAL ECHOCARDIOGRAM (TEE);  Surgeon: Sanda Klein, MD;  Location: [place];  Service: Cardiovascular;  Laterality: N/A;  . TONSILLECTOMY      Vitals:   08/17/19 0801  BP: 113/70  Pulse: 74  SpO2: 96%    Subjective Assessment - 08/17/19 0804    Subjective  Denies falls since last session. Noting L LE weakness d/t the rainy weather today.    Pertinent History  stroke, OA, migraines, HTN, HLD, depression, anxiety, anemia, ganglion cyst excision    Diagnostic tests  10/11/18 brain MRI: No acute intracranial abnormality.Unchanged small chronic infarcts.    Patient Stated Goals  work on walking without falling    Currently in Pain?  No/denies                        [facility] Adult PT Treatment/Exercise - 08/17/19 0001      Knee/Hip Exercises: Stretches   Passive Hamstring Stretch  Right;Left;1 rep;30 seconds    Passive Hamstring Stretch Limitations  + gastroc stretch; supine with strap      Knee/Hip Exercises: Aerobic   Nustep  Lvl 2, 6 min (LE/UE)      Knee/Hip Exercises: Seated   Long Arc Quad  Left;1 set;10 reps    Long Arc Quad Weight  1 lbs.    Long Arc Quad Limitations  1# L LE, 2# R LE; with slow eccentric lower    Clamshell with TheraBand  Red   15x; cues for symmetrical ROM   Other Seated Knee/Hip Exercises  L&R hip adduction with yellow TB x15    Sit to Sand  1 set;10 reps;without UE support;Other (comment)   cues for set up     Knee/Hip Exercises: Supine   Bridges  AROM;Strengthening;Both;1 set;Other (comment);10 reps    Bridges Limitations  limited ROM; mild L hip instability     Bridges with [employer]  Strengthening;1 set;10 reps               PT Short Term Goals -  07/28/19 0900      PT SHORT TERM GOAL #1   Title  Patient to be independent with initial HEP.    Time  3    Period  Weeks    Status  Achieved    Target Date  07/18/19        PT Long Term Goals - 07/28/19 0900      PT LONG TERM GOAL #1   Title  Patient to be independent with advanced HEP.    Time  4    Period  Weeks    Status  Partially Met   met for current; compliance varied d/t fatigue   Target Date  08/25/19      PT LONG TERM GOAL #2   Title  Patient to demonstrate >=4+/5 strength in R LE and >=3+/5 strength in L LE.     Time  4    Period  Weeks    Status  Partially Met   improvement in B hip flexion, abduction, adduction, and R knee flexion/extension. Distal L LE strength still most limiting.   Target Date  08/25/19      PT LONG TERM GOAL #3   Title  Patient to demonstrate TUG testing in <14 sec with LRAD to indicate decreased fall risk.    Time  4    Period  Weeks    Status  On-going   16.24 sec  with 4WW   Target Date  08/25/19      PT LONG TERM GOAL #4   Title  Patient to score >44/56 on Berg balance test with use of quad cane to demonstrate decreased fall risk.     Time  4    Period  Weeks    Status  Achieved   46/56 with 4HF   Target Date  08/25/19      PT LONG TERM GOAL #5   Title  Patient to demonstrate stair climbing up/down 13 steps with 1 handrail and quad cane with good stability and safe sequencing.     Time  4    Period  Weeks    Status  Deferred   not assessed d/t safety and fatigue   Target Date  08/25/19      PT LONG TERM GOAL #6   Title  Patient to perform 5xSTS in </= 25 sec with use of B armrests in order to decrease risk of falls.    Time  4    Period  Weeks    Status  On-going    Target Date  08/25/19            Plan - 08/17/19 0844    Clinical Impression Statement  Patient denied falls since last session. Did endorse L LE weakness d/t rainy weather this AM- considerable L LE shaking evident after warm up, thus worked on LE strengthening in sitting and supine to avoid falls. Reminded patient of importance of using 4WW inside the home as well as outside in order decrease risk of falls- patient agreeable. Patient demonstrated good knee stability with STS without UE support. Able to perform open chain quad strengthening with light weight and eccentric focus with understandably more difficulty on L LE vs. R LE. Mild L hip instability evident with glute strengthening but patient demonstrated good effort throughout with bridges. Patient tolerated all exercises well today without excessive fatigue. No complaints at end of session.    Comorbidities  stroke, OA, migraines, HTN, HLD, depression, anxiety, anemia, ganglion cyst excision  Rehab Potential  Good    PT Frequency  2x / week    PT Duration  4 weeks    PT Treatment/Interventions  ADLs/Self Care Home Management;Cryotherapy;Electrical Stimulation;Moist Heat;Balance training;Therapeutic  exercise;Therapeutic activities;Functional mobility training;Stair training;Gait training;Ultrasound;Neuromuscular re-education;Patient/family education;Manual techniques;Vasopneumatic Device;Taping;Splinting;Energy conservation;Passive range of motion    PT Next Visit Plan  check in orthostatic hypotension and any falls, continued L quad/glute strengthening progressed to standing TKE with band resistance, low forward/side step ups for stairs training, progressing SLS to be dynamic, if tolerated.    PT Home Exercise Plan  06/29/19 - Seated gastroc stretch, RW standing march; 07/06/19 - Standing heel raise, STS from chair at counter;  07/18/19 - with chair support or RW:  standing hip abduction (x 5 reps), standing HS curl, RW LE march    Consulted and Agree with Plan of Care  Patient       Patient will benefit from skilled therapeutic intervention in order to improve the following deficits and impairments:  Abnormal gait, Decreased knowledge of precautions, Decreased activity tolerance, Decreased strength, Decreased balance, Decreased mobility, Difficulty walking, Improper body mechanics, Decreased range of motion, Impaired flexibility, Postural dysfunction  Visit Diagnosis: Unsteadiness on feet  Other abnormalities of gait and mobility  Difficulty in walking, not elsewhere classified  Muscle weakness (generalized)     Problem List Patient Active Problem List   Diagnosis Date Noted  . Palpitations 10/12/2018  . Chest discomfort 10/12/2018  . Syncope and collapse 10/07/2018  . Migraine 05/21/2018  . Snoring 05/02/2015  . Essential hypertension 05/02/2015  . Headache 05/01/2015  . Former smoker 01/24/2015  . Left hemiparesis ([place]) 10/23/2014  . Cardiomyopathy due to hypertension ([facility]) 10/23/2014  . Chronic ischemic vertebrobasilar artery thalamic stroke   . Hypertensive heart disease   . Overweight (BMI 25.0-29.9)   . H/O noncompliance with medical treatment, presenting hazards to  health   . Aortic regurgitation   . History of ischemic middle cerebral artery stroke embolic   . Hyperlipidemia      Janene Harvey, PT, DPT 08/17/19 8:45 AM   [facility] [address]  [place], [place], [address] Phone: [phone]   Fax:  [phone]  Name: Brandy Roberts MRN: [id number] Date of Birth: 1971-02-03

## 2019-08-22 ENCOUNTER — Ambulatory Visit: Payer: Medicare Other | Admitting: Physical Therapy

## 2019-08-24 ENCOUNTER — Ambulatory Visit: Payer: Medicare Other | Admitting: Physical Therapy

## 2019-08-24 ENCOUNTER — Other Ambulatory Visit: Payer: Self-pay

## 2019-08-24 ENCOUNTER — Encounter: Payer: Self-pay | Admitting: Physical Therapy

## 2019-08-24 VITALS — BP 140/75 | HR 76

## 2019-08-24 DIAGNOSIS — R262 Difficulty in walking, not elsewhere classified: Secondary | ICD-10-CM

## 2019-08-24 DIAGNOSIS — M6281 Muscle weakness (generalized): Secondary | ICD-10-CM

## 2019-08-24 DIAGNOSIS — R2689 Other abnormalities of gait and mobility: Secondary | ICD-10-CM | POA: Diagnosis not present

## 2019-08-24 DIAGNOSIS — R2681 Unsteadiness on feet: Secondary | ICD-10-CM

## 2019-08-24 NOTE — Therapy (Signed)
[facility] [address]  [place], [place], [address] Phone: [phone]   Fax:  [phone]  Physical Therapy Treatment  Patient Details  Name: Brandy Roberts MRN: [id number] Date of Birth: 05/26/1971 Referring Provider (PT): [name], NP   Encounter Date: 08/24/2019  PT End of Session - 08/24/19 1035    Visit Number  14    Number of Visits  20    Date for PT Re-Evaluation  10/05/19    Authorization Type  [employer]HC Medicare & Medicaid    PT Start Time  0759    PT Stop Time  0844    PT Time Calculation (min)  45 min    Equipment Utilized During Treatment  Gait belt    Activity Tolerance  Patient tolerated treatment well    Behavior During Therapy  [facility] for tasks assessed/performed       Past Medical History:  Diagnosis Date  . Anemia   . Anxiety   . Chronic ischemic vertebrobasilar artery thalamic stroke   . Depression   . Hyperlipidemia   . Hypertension   . Migraines   . Nonrheumatic aortic (valve) insufficiency   . Osteoarthritis   . Sleep apnea   . Stroke [facility]) 10/25/2016    Past Surgical History:  Procedure Laterality Date  . CESAREAN SECTION    . GANGLION CYST EXCISION    . TEE WITHOUT CARDIOVERSION N/A 10/09/2014   Procedure: TRANSESOPHAGEAL ECHOCARDIOGRAM (TEE);  Surgeon: Sanda Klein, MD;  Location: [facility] ENDOSCOPY;  Service: Cardiovascular;  Laterality: N/A;  . TONSILLECTOMY      Vitals:   08/24/19 0801  BP: 140/75  Pulse: 76  SpO2: 99%    Subjective Assessment - 08/24/19 0804    Subjective  Had a good birthday. Feels like she is making good progress wiht therapy. Still having falls in her apartment which is her only problem. Denies recent falls.    Pertinent History  stroke, OA, migraines, HTN, HLD, depression, anxiety, anemia, ganglion cyst excision    Diagnostic tests  10/11/18 brain MRI: No acute intracranial abnormality.Unchanged small chronic infarcts.    Patient Stated Goals   work on walking without falling    Currently in Pain?  No/denies         [facility] PT Assessment - 08/24/19 0001      Assessment   Medical Diagnosis  Frequent Falls    Referring Provider (PT)  [name], NP    Onset Date/Surgical Date  06/26/18      Strength   Right Hip Flexion  5/5    Right Hip ABduction  4+/5    Right Hip ADduction  4+/5    Left Hip Flexion  4-/5    Left Hip ABduction  3+/5    Left Hip ADduction  4-/5    Right Knee Flexion  5/5    Right Knee Extension  5/5    Left Knee Flexion  3/5    Left Knee Extension  3/5    Right Ankle Dorsiflexion  4+/5    Right Ankle Plantar Flexion  4/5    Left Ankle Dorsiflexion  2/5    Left Ankle Plantar Flexion  2+/5      Standardized Balance Assessment   Standardized Balance Assessment  Five Times Sit to Stand;Berg Balance Test    Five times sit to stand comments   23.83   without UE use     Timed Up and Go Test   Normal TUG (  seconds)  16.56   with rollator                  [facility] Adult PT Treatment/Exercise - 08/24/19 0001      Ambulation/Gait   Stairs  Yes    Stairs Assistance  4: Min guard    Stair Management Technique  One rail Left;Step to pattern;Alternating pattern;With cane    Number of Stairs  14    Height of Stairs  8    Gait Comments  step-to and step through pattern when ascending, step-to pattern descending; cues for safe sequencing with SPC      Knee/Hip Exercises: Aerobic   Nustep  Lvl 2, 6 min (LE/UE)             PT Education - 08/24/19 1034    Education Details  review of HEP update for max understanding; discussion on objective progress and remaining impairments    Person(s) Educated  Patient    Methods  Explanation;Demonstration;Tactile cues;Verbal cues;Handout    Comprehension  Verbalized understanding;Returned demonstration       PT Short Term Goals - 08/24/19 0807      PT SHORT TERM GOAL #1   Title  Patient to be independent with initial HEP.    Time  3    Period   Weeks    Status  Achieved    Target Date  07/18/19        PT Long Term Goals - 08/24/19 5361      PT LONG TERM GOAL #1   Title  Patient to be independent with advanced HEP.    Time  6    Period  Weeks    Status  Partially Met   met for current; compliance varied d/t fatigue   Target Date  10/05/19      PT LONG TERM GOAL #2   Title  Patient to demonstrate >=4+/5 strength in R LE and >=3+/5 strength in L LE.     Time  6    Period  Weeks    Status  Partially Met   improved L hip flexion and adduction; still showing limitations in musculature surrounding L knee and ankle   Target Date  10/05/19      PT LONG TERM GOAL #3   Title  Patient to demonstrate TUG testing in <14 sec with LRAD to indicate decreased fall risk.    Time  6    Period  Weeks    Status  On-going   16.5 sec with 4WW   Target Date  10/05/19      PT LONG TERM GOAL #4   Title  Patient to score >44/56 on Berg balance test with use of quad cane to demonstrate decreased fall risk.     Time  4    Period  Weeks    Status  Achieved   46/56 with 4ER     PT LONG TERM GOAL #5   Title  Patient to demonstrate stair climbing up/down 13 steps with 1 handrail and quad cane with good stability and safe sequencing.     Time  6    Period  Weeks    Status  On-going   able to perform with heavy cueing for safe SPC sequencing   Target Date  10/05/19      PT LONG TERM GOAL #6   Title  Patient to perform 5xSTS in </= 20 sec without use of armrests in order to decrease risk of falls.  Time  6    Period  Weeks    Status  On-going   23.8 sec without UE support   Target Date  10/05/19            Plan - 08/24/19 1040    Clinical Impression Statement  Patient reporting good benefit from therapy, however does note problems with falls at home. Reports use of her walker inside as was advised last session. Denies questions on HEP. Patient has shown improved L hip flexion and adduction with strength testing; still showing  limitations in musculature surrounding L knee and ankle. TUG unchanged at this time. However, patient has met her 5xSTS goal, thus adjusted this goal to quicken patient's speed to continue decreasing fall risk. Patient was able to perform stair climbing using both step-to and step through pattern when ascending, and step-to pattern when descending. Provided consistent cues for safe sequencing with SPC. Adjusted HEP with ther-ex to address remaining strength deficits. Patient reported understanding. Patient is demonstrating goof progress towards goals. Would benefit from continued skilled PT services 1x/week for 6 weeks to address remaining impairments.    Comorbidities  stroke, OA, migraines, HTN, HLD, depression, anxiety, anemia, ganglion cyst excision    Rehab Potential  Good    PT Frequency  1x / week    PT Duration  6 weeks    PT Treatment/Interventions  ADLs/Self Care Home Management;Cryotherapy;Electrical Stimulation;Moist Heat;Balance training;Therapeutic exercise;Therapeutic activities;Functional mobility training;Stair training;Gait training;Ultrasound;Neuromuscular re-education;Patient/family education;Manual techniques;Vasopneumatic Device;Taping;Splinting;Energy conservation;Passive range of motion    PT Next Visit Plan  review recent HEP update; stair training with cane; continued L quad/glute strengthening progressed to standing TKE with band resistance, low forward/side step ups for stairs training, progressing SLS to be dynamic, if tolerated.    PT Home Exercise Plan  06/29/19 - Seated gastroc stretch, RW standing march; 07/06/19 - Standing heel raise, STS from chair at counter;  07/18/19 - with chair support or RW:  standing hip abduction (x 5 reps), standing HS curl, RW LE march    Consulted and Agree with Plan of Care  Patient       Patient will benefit from skilled therapeutic intervention in order to improve the following deficits and impairments:  Abnormal gait, Decreased knowledge  of precautions, Decreased activity tolerance, Decreased strength, Decreased balance, Decreased mobility, Difficulty walking, Improper body mechanics, Decreased range of motion, Impaired flexibility, Postural dysfunction  Visit Diagnosis: Unsteadiness on feet  Other abnormalities of gait and mobility  Difficulty in walking, not elsewhere classified  Muscle weakness (generalized)     Problem List Patient Active Problem List   Diagnosis Date Noted  . Palpitations 10/12/2018  . Chest discomfort 10/12/2018  . Syncope and collapse 10/07/2018  . Migraine 05/21/2018  . Snoring 05/02/2015  . Essential hypertension 05/02/2015  . Headache 05/01/2015  . Former smoker 01/24/2015  . Left hemiparesis ([place]) 10/23/2014  . Cardiomyopathy due to hypertension ([place]) 10/23/2014  . Chronic ischemic vertebrobasilar artery thalamic stroke   . Hypertensive heart disease   . Overweight (BMI 25.0-29.9)   . H/O noncompliance with medical treatment, presenting hazards to health   . Aortic regurgitation   . History of ischemic middle cerebral artery stroke embolic   . Hyperlipidemia     Janene Harvey, PT, DPT 08/24/19 10:47 AM   [facility] [address]  [place], [place], [address] Phone: [phone]   Fax:  [phone]  Name: Brandy Roberts MRN: [id number] Date of Birth: [birth year]

## 2019-08-26 ENCOUNTER — Ambulatory Visit: Payer: Medicare Other | Admitting: Nurse Practitioner

## 2019-08-29 ENCOUNTER — Ambulatory Visit: Payer: Medicare Other | Admitting: Physical Therapy

## 2019-09-01 ENCOUNTER — Other Ambulatory Visit: Payer: Self-pay

## 2019-09-01 ENCOUNTER — Encounter: Payer: Self-pay | Admitting: Physical Therapy

## 2019-09-01 ENCOUNTER — Other Ambulatory Visit: Payer: Self-pay | Admitting: Nurse Practitioner

## 2019-09-01 ENCOUNTER — Ambulatory Visit: Payer: Medicare Other | Attending: Nurse Practitioner | Admitting: Physical Therapy

## 2019-09-01 VITALS — BP 117/65 | HR 79

## 2019-09-01 DIAGNOSIS — R2689 Other abnormalities of gait and mobility: Secondary | ICD-10-CM

## 2019-09-01 DIAGNOSIS — K2100 Gastro-esophageal reflux disease with esophagitis, without bleeding: Secondary | ICD-10-CM

## 2019-09-01 DIAGNOSIS — R262 Difficulty in walking, not elsewhere classified: Secondary | ICD-10-CM | POA: Diagnosis not present

## 2019-09-01 DIAGNOSIS — G629 Polyneuropathy, unspecified: Secondary | ICD-10-CM

## 2019-09-01 DIAGNOSIS — R2681 Unsteadiness on feet: Secondary | ICD-10-CM | POA: Insufficient documentation

## 2019-09-01 DIAGNOSIS — M6281 Muscle weakness (generalized): Secondary | ICD-10-CM | POA: Insufficient documentation

## 2019-09-01 NOTE — Therapy (Signed)
[facility] [address]  [place], [place], [address] Phone: [phone]   Fax:  [phone]  Physical Therapy Treatment  Patient Details  Name: Brandy Roberts MRN: [id number] Date of Birth: 01/21/1971 Referring Provider (PT): [name], NP   Encounter Date: 09/01/2019  PT End of Session - 09/01/19 1206    Visit Number  15    Number of Visits  20    Date for PT Re-Evaluation  10/05/19    Authorization Type  [employer] Medicare & Medicaid    PT Start Time  [phone]    PT Stop Time  1016    PT Time Calculation (min)  40 min    Activity Tolerance  Patient tolerated treatment well    Behavior During Therapy  [facility] for tasks assessed/performed       Past Medical History:  Diagnosis Date  . Anemia   . Anxiety   . Chronic ischemic vertebrobasilar artery thalamic stroke   . Depression   . Hyperlipidemia   . Hypertension   . Migraines   . Nonrheumatic aortic (valve) insufficiency   . Osteoarthritis   . Sleep apnea   . Stroke [facility]) 10/25/2016    Past Surgical History:  Procedure Laterality Date  . CESAREAN SECTION    . GANGLION CYST EXCISION    . TEE WITHOUT CARDIOVERSION N/A 10/09/2014   Procedure: TRANSESOPHAGEAL ECHOCARDIOGRAM (TEE);  Surgeon: Sanda Klein, MD;  Location: [facility] ENDOSCOPY;  Service: Cardiovascular;  Laterality: N/A;  . TONSILLECTOMY      Vitals:   09/01/19 0937  BP: 117/65  Pulse: 79  SpO2: 97%    Subjective Assessment - 09/01/19 0941    Subjective  No complaints today. Denies falls since last session.    Pertinent History  stroke, OA, migraines, HTN, HLD, depression, anxiety, anemia, ganglion cyst excision    Diagnostic tests  10/11/18 brain MRI: No acute intracranial abnormality.Unchanged small chronic infarcts.    Patient Stated Goals  work on walking without falling    Currently in Pain?  No/denies                       [facility] Adult PT Treatment/Exercise - 09/01/19 0001       Knee/Hip Exercises: Aerobic   Nustep  Lvl 3, 6 min (LE/UE)      Knee/Hip Exercises: Standing   Hip ADduction  Strengthening;Left;1 set;10 reps    Hip ADduction Limitations  yellow TB and UE support on TM rail   heavy cues for TKE; pt with difficulty coordinating movement   Hip Abduction  Stengthening;Left;1 set;10 reps;Knee straight    Abduction Limitations  yellow TB and UE support on TM rail   lacking eccentric control   Functional Squat  10 reps;3 seconds;1 set    Functional Squat Limitations  at TM rail   c/o muscle burn   Other Standing Knee Exercises  sidestepping with yellow TB around ankles 4x length of treadmill    c/o muscle burn     Knee/Hip Exercises: Seated   Other Seated Knee/Hip Exercises  L ankle dorsiflexion with yellow TB 2x10             PT Education - 09/01/19 1022    Education Details  update to HEP; administered yellow loop    Person(s) Educated  Patient    Methods  Explanation;Demonstration;Tactile cues;Verbal cues;Handout    Comprehension  Returned demonstration;Verbalized understanding       PT Short  Term Goals - 08/24/19 0807      PT SHORT TERM GOAL #1   Title  Patient to be independent with initial HEP.    Time  3    Period  Weeks    Status  Achieved    Target Date  07/18/19        PT Long Term Goals - 08/24/19 9758      PT LONG TERM GOAL #1   Title  Patient to be independent with advanced HEP.    Time  6    Period  Weeks    Status  Partially Met   met for current; compliance varied d/t fatigue   Target Date  10/05/19      PT LONG TERM GOAL #2   Title  Patient to demonstrate >=4+/5 strength in R LE and >=3+/5 strength in L LE.     Time  6    Period  Weeks    Status  Partially Met   improved L hip flexion and adduction; still showing limitations in musculature surrounding L knee and ankle   Target Date  10/05/19      PT LONG TERM GOAL #3   Title  Patient to demonstrate TUG testing in <14 sec with LRAD to indicate  decreased fall risk.    Time  6    Period  Weeks    Status  On-going   16.5 sec with 4WW   Target Date  10/05/19      PT LONG TERM GOAL #4   Title  Patient to score >44/56 on Berg balance test with use of quad cane to demonstrate decreased fall risk.     Time  4    Period  Weeks    Status  Achieved   46/56 with 8TG     PT LONG TERM GOAL #5   Title  Patient to demonstrate stair climbing up/down 13 steps with 1 handrail and quad cane with good stability and safe sequencing.     Time  6    Period  Weeks    Status  On-going   able to perform with heavy cueing for safe SPC sequencing   Target Date  10/05/19      PT LONG TERM GOAL #6   Title  Patient to perform 5xSTS in </= 20 sec without use of armrests in order to decrease risk of falls.    Time  6    Period  Weeks    Status  On-going   23.8 sec without UE support   Target Date  10/05/19            Plan - 09/01/19 1206    Clinical Impression Statement  Patient without complaints today. Worked on standing LE strengthening ther-ex to address remaining L LE weakness demonstrated in last objective measurements. Patient demonstrated L LE shaking and difficulty coordinating movement with banded hip adduction and lacking eccentric control with hip abduction. Despite difficulty, patient tolerating these exercises well and showing good effort. Able to demonstrate fair L ankle dorsiflexion with resistance today. Updated HEP with hip strengthening exercises as they were safely performed today- patient reported understanding. Allowed for rest break before leaving session as patient reported muscle fatigue. No complaints at end of session.    Comorbidities  stroke, OA, migraines, HTN, HLD, depression, anxiety, anemia, ganglion cyst excision    Rehab Potential  Good    PT Frequency  1x / week    PT Duration  6 weeks  PT Treatment/Interventions  ADLs/Self Care Home Management;Cryotherapy;Electrical Stimulation;Moist Heat;Balance  training;Therapeutic exercise;Therapeutic activities;Functional mobility training;Stair training;Gait training;Ultrasound;Neuromuscular re-education;Patient/family education;Manual techniques;Vasopneumatic Device;Taping;Splinting;Energy conservation;Passive range of motion    PT Next Visit Plan  review recent HEP update; stair training with cane; continued L quad/glute strengthening progressed to standing TKE with band resistance, low forward/side step ups for stairs training, progressing SLS to be dynamic, if tolerated.    PT Home Exercise Plan  06/29/19 - Seated gastroc stretch, RW standing march; 07/06/19 - Standing heel raise, STS from chair at counter;  07/18/19 - with chair support or RW:  standing hip abduction (x 5 reps), standing HS curl, RW LE march    Consulted and Agree with Plan of Care  Patient       Patient will benefit from skilled therapeutic intervention in order to improve the following deficits and impairments:  Abnormal gait, Decreased knowledge of precautions, Decreased activity tolerance, Decreased strength, Decreased balance, Decreased mobility, Difficulty walking, Improper body mechanics, Decreased range of motion, Impaired flexibility, Postural dysfunction  Visit Diagnosis: Unsteadiness on feet  Other abnormalities of gait and mobility  Difficulty in walking, not elsewhere classified  Muscle weakness (generalized)     Problem List Patient Active Problem List   Diagnosis Date Noted  . Palpitations 10/12/2018  . Chest discomfort 10/12/2018  . Syncope and collapse 10/07/2018  . Migraine 05/21/2018  . Snoring 05/02/2015  . Essential hypertension 05/02/2015  . Headache 05/01/2015  . Former smoker 01/24/2015  . Left hemiparesis ([place]) 10/23/2014  . Cardiomyopathy due to hypertension ([place]) 10/23/2014  . Chronic ischemic vertebrobasilar artery thalamic stroke   . Hypertensive heart disease   . Overweight (BMI 25.0-29.9)   . H/O noncompliance with medical  treatment, presenting hazards to health   . Aortic regurgitation   . History of ischemic middle cerebral artery stroke embolic   . Hyperlipidemia     Janene Harvey, PT, DPT 09/01/19 12:09 PM   [facility] [address]  [place], [place], [address] Phone: [phone]   Fax:  [phone]  Name: Jazlyne Gauger MRN: [id number] Date of Birth: 1970/08/17

## 2019-09-05 ENCOUNTER — Ambulatory Visit: Payer: Medicare Other | Admitting: Physical Therapy

## 2019-09-06 DIAGNOSIS — I639 Cerebral infarction, unspecified: Secondary | ICD-10-CM | POA: Diagnosis not present

## 2019-09-08 ENCOUNTER — Other Ambulatory Visit: Payer: Self-pay

## 2019-09-08 ENCOUNTER — Ambulatory Visit: Payer: Medicare Other | Admitting: Physical Therapy

## 2019-09-08 ENCOUNTER — Encounter: Payer: Self-pay | Admitting: Physical Therapy

## 2019-09-08 VITALS — BP 118/68 | HR 73

## 2019-09-08 DIAGNOSIS — M6281 Muscle weakness (generalized): Secondary | ICD-10-CM

## 2019-09-08 DIAGNOSIS — R2681 Unsteadiness on feet: Secondary | ICD-10-CM | POA: Diagnosis not present

## 2019-09-08 DIAGNOSIS — R262 Difficulty in walking, not elsewhere classified: Secondary | ICD-10-CM | POA: Diagnosis not present

## 2019-09-08 DIAGNOSIS — R2689 Other abnormalities of gait and mobility: Secondary | ICD-10-CM | POA: Diagnosis not present

## 2019-09-08 NOTE — Therapy (Signed)
[facility] [address]  [place], [place], [address] Phone: [phone]   Fax:  [phone]  Physical Therapy Treatment  Patient Details  Name: Brandy Roberts MRN: [id number] Date of Birth: December 13, 1970 Referring Provider (PT): [name], NP   Encounter Date: 09/08/2019  PT End of Session - 09/08/19 0927    Visit Number  16    Number of Visits  20    Date for PT Re-Evaluation  10/05/19    Authorization Type  [employer]HC Medicare & Medicaid    PT Start Time  0846    PT Stop Time  0926    PT Time Calculation (min)  40 min    Activity Tolerance  Patient tolerated treatment well;Patient limited by pain;Patient limited by fatigue    Behavior During Therapy  [facility] for tasks assessed/performed       Past Medical History:  Diagnosis Date  . Anemia   . Anxiety   . Chronic ischemic vertebrobasilar artery thalamic stroke   . Depression   . Hyperlipidemia   . Hypertension   . Migraines   . Nonrheumatic aortic (valve) insufficiency   . Osteoarthritis   . Sleep apnea   . Stroke [facility]) 10/25/2016    Past Surgical History:  Procedure Laterality Date  . CESAREAN SECTION    . GANGLION CYST EXCISION    . TEE WITHOUT CARDIOVERSION N/A 10/09/2014   Procedure: TRANSESOPHAGEAL ECHOCARDIOGRAM (TEE);  Surgeon: Sanda Klein, MD;  Location: [facility] ENDOSCOPY;  Service: Cardiovascular;  Laterality: N/A;  . TONSILLECTOMY      Vitals:   09/08/19 0848  BP: 118/68  Pulse: 73  SpO2: 97%    Subjective Assessment - 09/08/19 0851    Subjective  Doing well. Has an appointment to be fitted for her AFO tomorrow. L LE is not feeling too good today as she got a bruise over the L thigh from her monthly migraine shot yesterday.    Pertinent History  stroke, OA, migraines, HTN, HLD, depression, anxiety, anemia, ganglion cyst excision    Diagnostic tests  10/11/18 brain MRI: No acute intracranial abnormality.Unchanged small chronic infarcts.    Patient Stated Goals  work on walking without falling    Currently in Pain?  Yes    Pain Score  7     Pain Location  Leg    Pain Orientation  Left    Pain Descriptors / Indicators  Tiring    Pain Type  Chronic pain    Pain Radiating Towards  entire LE                       [facility] Adult PT Treatment/Exercise - 09/08/19 0001      Knee/Hip Exercises: Stretches   Gastroc Stretch  Right;Left;30 seconds;2 reps    Gastroc Stretch Limitations  toes on towel roll at counter      Knee/Hip Exercises: Aerobic   Nustep  Lvl 3, 6 min (LE/UE)      Knee/Hip Exercises: Standing   Heel Raises  Both;1 set;15 reps    Heel Raises Limitations  B heel/toe raise at counter    Hip ADduction  Strengthening;Left;1 set;10 reps;Right    Hip ADduction Limitations  at counter top   difficulty coordinating L LE   Hip Abduction  Stengthening;Left;1 set;10 reps;Knee straight;Right    Abduction Limitations  at counter top    Other Standing Knee Exercises  sidestepping with yellow TB around ankles 2x length of counter  Knee/Hip Exercises: Seated   Long Arc Quad  Left;1 set;10 reps    [employer] Limitations  cues for [employer] Education - 09/08/19 8242    Education Details  update/consolidation of HEP    Person(s) Educated  Patient    Methods  Explanation;Demonstration;Tactile cues;Verbal cues;Handout    Comprehension  Verbalized understanding;Returned demonstration       PT Short Term Goals - 08/24/19 0807      PT SHORT TERM GOAL #1   Title  Patient to be independent with initial HEP.    Time  3    Period  Weeks    Status  Achieved    Target Date  07/18/19        PT Long Term Goals - 08/24/19 3536      PT LONG TERM GOAL #1   Title  Patient to be independent with advanced HEP.    Time  6    Period  Weeks    Status  Partially Met   met for current; compliance varied d/t fatigue   Target Date  10/05/19      PT LONG TERM GOAL #2   Title  Patient to  demonstrate >=4+/5 strength in R LE and >=3+/5 strength in L LE.     Time  6    Period  Weeks    Status  Partially Met   improved L hip flexion and adduction; still showing limitations in musculature surrounding L knee and ankle   Target Date  10/05/19      PT LONG TERM GOAL #3   Title  Patient to demonstrate TUG testing in <14 sec with LRAD to indicate decreased fall risk.    Time  6    Period  Weeks    Status  On-going   16.5 sec with 4WW   Target Date  10/05/19      PT LONG TERM GOAL #4   Title  Patient to score >44/56 on Berg balance test with use of quad cane to demonstrate decreased fall risk.     Time  4    Period  Weeks    Status  Achieved   46/56 with 1WE     PT LONG TERM GOAL #5   Title  Patient to demonstrate stair climbing up/down 13 steps with 1 handrail and quad cane with good stability and safe sequencing.     Time  6    Period  Weeks    Status  On-going   able to perform with heavy cueing for safe SPC sequencing   Target Date  10/05/19      PT LONG TERM GOAL #6   Title  Patient to perform 5xSTS in </= 20 sec without use of armrests in order to decrease risk of falls.    Time  6    Period  Weeks    Status  On-going   23.8 sec without UE support   Target Date  10/05/19            Plan - 09/08/19 3154    Clinical Impression Statement  Patient reporting "tired" sensation in L LE from receiving her migraine shot in the L thigh yesterday. Will be fitting for AFO tomorrow. Consolidated HEP for max benefit and worked on reviewing these exercises for max carryover. Patient demonstrating decreased muscle control and coordination with standing hip strengthening on L LE vs. R LE. Also required  intermittent cues for upright posture as patient tending to let her chest fall forward when focused. Patient requiring intermittent sitting rest breaks d/t L LE pain and fatigue, but demonstrating good effort to continue despite pain. Educated patient on daily HEP compliance  for most benefit- patient reported understanding and without further complaints at end of session.    Comorbidities  stroke, OA, migraines, HTN, HLD, depression, anxiety, anemia, ganglion cyst excision    Rehab Potential  Good    PT Frequency  1x / week    PT Duration  6 weeks    PT Treatment/Interventions  ADLs/Self Care Home Management;Cryotherapy;Electrical Stimulation;Moist Heat;Balance training;Therapeutic exercise;Therapeutic activities;Functional mobility training;Stair training;Gait training;Ultrasound;Neuromuscular re-education;Patient/family education;Manual techniques;Vasopneumatic Device;Taping;Splinting;Energy conservation;Passive range of motion    PT Next Visit Plan  stair training with cane; continued L quad/glute strengthening progressed to standing TKE with band resistance, low forward/side step ups for stairs training, progressing SLS to be dynamic, if tolerated.    PT Home Exercise Plan  06/29/19 - Seated gastroc stretch, RW standing march; 07/06/19 - Standing heel raise, STS from chair at counter;  07/18/19 - with chair support or RW:  standing hip abduction (x 5 reps), standing HS curl, RW LE march    Consulted and Agree with Plan of Care  Patient       Patient will benefit from skilled therapeutic intervention in order to improve the following deficits and impairments:  Abnormal gait, Decreased knowledge of precautions, Decreased activity tolerance, Decreased strength, Decreased balance, Decreased mobility, Difficulty walking, Improper body mechanics, Decreased range of motion, Impaired flexibility, Postural dysfunction  Visit Diagnosis: Unsteadiness on feet  Other abnormalities of gait and mobility  Difficulty in walking, not elsewhere classified  Muscle weakness (generalized)     Problem List Patient Active Problem List   Diagnosis Date Noted  . Palpitations 10/12/2018  . Chest discomfort 10/12/2018  . Syncope and collapse 10/07/2018  . Migraine 05/21/2018  .  Snoring 05/02/2015  . Essential hypertension 05/02/2015  . Headache 05/01/2015  . Former smoker 01/24/2015  . Left hemiparesis ([place]) 10/23/2014  . Cardiomyopathy due to hypertension ([place]) 10/23/2014  . Chronic ischemic vertebrobasilar artery thalamic stroke   . Hypertensive heart disease   . Overweight (BMI 25.0-29.9)   . H/O noncompliance with medical treatment, presenting hazards to health   . Aortic regurgitation   . History of ischemic middle cerebral artery stroke embolic   . Hyperlipidemia      Janene Harvey, PT, DPT 09/08/19 9:29 AM   [facility] [address]  [place], [place], [address] Phone: [phone]   Fax:  [phone]  Name: Brandy Roberts MRN: [id number] Date of Birth: 1971-04-08

## 2019-09-12 ENCOUNTER — Encounter: Payer: Medicare Other | Admitting: Physical Therapy

## 2019-09-13 ENCOUNTER — Ambulatory Visit: Payer: Medicare Other | Admitting: Physical Therapy

## 2019-09-13 ENCOUNTER — Encounter: Payer: Self-pay | Admitting: Physical Therapy

## 2019-09-13 ENCOUNTER — Other Ambulatory Visit: Payer: Self-pay

## 2019-09-13 VITALS — BP 131/65 | HR 76

## 2019-09-13 DIAGNOSIS — M6281 Muscle weakness (generalized): Secondary | ICD-10-CM | POA: Diagnosis not present

## 2019-09-13 DIAGNOSIS — R2689 Other abnormalities of gait and mobility: Secondary | ICD-10-CM | POA: Diagnosis not present

## 2019-09-13 DIAGNOSIS — R262 Difficulty in walking, not elsewhere classified: Secondary | ICD-10-CM

## 2019-09-13 DIAGNOSIS — R2681 Unsteadiness on feet: Secondary | ICD-10-CM

## 2019-09-13 NOTE — Therapy (Signed)
[facility] [address]  [place], [place], [address] Phone: [phone]   Fax:  [phone]  Physical Therapy Treatment  Patient Details  Name: Brandy Roberts MRN: [id number] Date of Birth: 30-Apr-1971 Referring Provider (PT): [name], NP   Encounter Date: 09/13/2019  PT End of Session - 09/13/19 1021    Visit Number  17    Number of Visits  20    Date for PT Re-Evaluation  10/05/19    Authorization Type  [employer] Medicare & Medicaid    PT Start Time  0930    PT Stop Time  1015    PT Time Calculation (min)  45 min    Equipment Utilized During Treatment  Gait belt    Activity Tolerance  Patient tolerated treatment well;Patient limited by fatigue    Behavior During Therapy  [facility] for tasks assessed/performed       Past Medical History:  Diagnosis Date  . Anemia   . Anxiety   . Chronic ischemic vertebrobasilar artery thalamic stroke   . Depression   . Hyperlipidemia   . Hypertension   . Migraines   . Nonrheumatic aortic (valve) insufficiency   . Osteoarthritis   . Sleep apnea   . Stroke [facility]) 10/25/2016    Past Surgical History:  Procedure Laterality Date  . CESAREAN SECTION    . GANGLION CYST EXCISION    . TEE WITHOUT CARDIOVERSION N/A 10/09/2014   Procedure: TRANSESOPHAGEAL ECHOCARDIOGRAM (TEE);  Surgeon: Sanda Klein, MD;  Location: [facility] ENDOSCOPY;  Service: Cardiovascular;  Laterality: N/A;  . TONSILLECTOMY      Vitals:   09/13/19 0931  BP: 131/65  Pulse: 76  SpO2: 93%    Subjective Assessment - 09/13/19 0935    Subjective  Had her AFO fitting and will be picking it up on the 19th. Deneis falls since last session.    Pertinent History  stroke, OA, migraines, HTN, HLD, depression, anxiety, anemia, ganglion cyst excision    Diagnostic tests  10/11/18 brain MRI: No acute intracranial abnormality.Unchanged small chronic infarcts.    Patient Stated Goals  work on walking without falling     Currently in Pain?  No/denies                       [facility] Adult PT Treatment/Exercise - 09/13/19 0001      Ambulation/Gait   Ambulation Distance (Feet)  100 Feet    Assistive device  Straight cane    Gait Pattern  Step-to pattern;Step-through pattern;Decreased hip/knee flexion - left;Decreased stance time - left;Decreased weight shift to left;Left genu recurvatum;Poor foot clearance - left    Ambulation Surface  Level;Indoor    Stairs  Yes    Stairs Assistance  4: Min guard    Stair Management Technique  One rail Left;Step to pattern;With cane    Number of Stairs  14    Height of Stairs  8    Gait Comments  cues for safe SPC sequencing pattern when ascending and descending    stopped mid-way d/t L LE shaking which dissipated with rest     Neuro Re-ed    Neuro Re-ed Details   sidestepping over pincushion x10 and x10 over line on tiles with B UE support on counter   cues for "tall and wide" step     Knee/Hip Exercises: Stretches   Gastroc Stretch  Right;Left;1 rep;30 seconds    Gastroc Stretch Limitations  runner's stretch at  counter      Knee/Hip Exercises: Aerobic   Nustep  Lvl 3, 6 min (LE/UE)      Knee/Hip Exercises: Standing   Terminal Knee Extension  Strengthening;Left;1 set;5 sets    Theraband Level (Terminal Knee Extension)  Level 2 (Red)    Terminal Knee Extension Limitations  5x3" with manual cues for proper motion   discontinued d/t LE shaking     Knee/Hip Exercises: Seated   Long Arc Quad  Left;1 set;10 reps    Long Arc Quad Weight  1 lbs.    Long [employer] Limitations  cues for [employer] movement              PT Short Term Goals - 08/24/19 0807      PT SHORT TERM GOAL #1   Title  Patient to be independent with initial HEP.    Time  3    Period  Weeks    Status  Achieved    Target Date  07/18/19        PT Long Term Goals - 08/24/19 1610      PT LONG TERM GOAL #1   Title  Patient to be independent with advanced HEP.     Time  6    Period  Weeks    Status  Partially Met   met for current; compliance varied d/t fatigue   Target Date  10/05/19      PT LONG TERM GOAL #2   Title  Patient to demonstrate >=4+/5 strength in R LE and >=3+/5 strength in L LE.     Time  6    Period  Weeks    Status  Partially Met   improved L hip flexion and adduction; still showing limitations in musculature surrounding L knee and ankle   Target Date  10/05/19      PT LONG TERM GOAL #3   Title  Patient to demonstrate TUG testing in <14 sec with LRAD to indicate decreased fall risk.    Time  6    Period  Weeks    Status  On-going   16.5 sec with 4WW   Target Date  10/05/19      PT LONG TERM GOAL #4   Title  Patient to score >44/56 on Berg balance test with use of quad cane to demonstrate decreased fall risk.     Time  4    Period  Weeks    Status  Achieved   46/56 with 9UE     PT LONG TERM GOAL #5   Title  Patient to demonstrate stair climbing up/down 13 steps with 1 handrail and quad cane with good stability and safe sequencing.     Time  6    Period  Weeks    Status  On-going   able to perform with heavy cueing for safe SPC sequencing   Target Date  10/05/19      PT LONG TERM GOAL #6   Title  Patient to perform 5xSTS in </= 20 sec without use of armrests in order to decrease risk of falls.    Time  6    Period  Weeks    Status  On-going   23.8 sec without UE support   Target Date  10/05/19            Plan - 09/13/19 1021    Clinical Impression Statement  Patient noting that she will be receiving her new AFO on 09/19/19.  Reviewed safe SPC sequencing with stair navigation and gait training. Patient required cues intermittently to remind her of maintaining proper pattern as she quickly loses it when distracted.  Able to perform step-to pattern with mild-moderate instability today. Demonstrated L LE shaking which required standing rest break mid-way on stairs to allow stability to return. Patient quite  fatigued after gait and stair training, requiring sitting rest break. Worked on sitting LE strengthening ther-ex d/t patient's c/o fatigue. Required consistent cueing for TKE with LAQ d/t difficulty engaging quads and discoordination.  Ended session d/t return of muscle shaking in L LE. Patient was allowed sitting rest break until she was able to leave session safely.    Comorbidities  stroke, OA, migraines, HTN, HLD, depression, anxiety, anemia, ganglion cyst excision    Rehab Potential  Good    PT Frequency  1x / week    PT Duration  6 weeks    PT Treatment/Interventions  ADLs/Self Care Home Management;Cryotherapy;Electrical Stimulation;Moist Heat;Balance training;Therapeutic exercise;Therapeutic activities;Functional mobility training;Stair training;Gait training;Ultrasound;Neuromuscular re-education;Patient/family education;Manual techniques;Vasopneumatic Device;Taping;Splinting;Energy conservation;Passive range of motion    PT Next Visit Plan  stair training with cane; continued L quad/glute strengthening progressed to standing TKE with band resistance, low forward/side step ups for stairs training, progressing SLS to be dynamic, if tolerated.    PT Home Exercise Plan  06/29/19 - Seated gastroc stretch, RW standing march; 07/06/19 - Standing heel raise, STS from chair at counter;  07/18/19 - with chair support or RW:  standing hip abduction (x 5 reps), standing HS curl, RW LE march    Consulted and Agree with Plan of Care  Patient       Patient will benefit from skilled therapeutic intervention in order to improve the following deficits and impairments:  Abnormal gait, Decreased knowledge of precautions, Decreased activity tolerance, Decreased strength, Decreased balance, Decreased mobility, Difficulty walking, Improper body mechanics, Decreased range of motion, Impaired flexibility, Postural dysfunction  Visit Diagnosis: Unsteadiness on feet  Other abnormalities of gait and  mobility  Difficulty in walking, not elsewhere classified  Muscle weakness (generalized)     Problem List Patient Active Problem List   Diagnosis Date Noted  . Palpitations 10/12/2018  . Chest discomfort 10/12/2018  . Syncope and collapse 10/07/2018  . Migraine 05/21/2018  . Snoring 05/02/2015  . Essential hypertension 05/02/2015  . Headache 05/01/2015  . Former smoker 01/24/2015  . Left hemiparesis ([place]) 10/23/2014  . Cardiomyopathy due to hypertension ([place]) 10/23/2014  . Chronic ischemic vertebrobasilar artery thalamic stroke   . Hypertensive heart disease   . Overweight (BMI 25.0-29.9)   . H/O noncompliance with medical treatment, presenting hazards to health   . Aortic regurgitation   . History of ischemic middle cerebral artery stroke embolic   . Hyperlipidemia      Janene Harvey, PT, DPT 09/13/19 11:12 AM   [facility] [address]  [place], [place], [address] Phone: [phone]   Fax:  [phone]  Name: Brandy Roberts MRN: [id number] Date of Birth: [birth year]

## 2019-09-19 ENCOUNTER — Encounter: Payer: Medicare Other | Admitting: Physical Therapy

## 2019-09-19 DIAGNOSIS — M21372 Foot drop, left foot: Secondary | ICD-10-CM | POA: Diagnosis not present

## 2019-09-20 ENCOUNTER — Other Ambulatory Visit: Payer: Self-pay | Admitting: Nurse Practitioner

## 2019-09-20 DIAGNOSIS — E785 Hyperlipidemia, unspecified: Secondary | ICD-10-CM

## 2019-09-20 DIAGNOSIS — I10 Essential (primary) hypertension: Secondary | ICD-10-CM

## 2019-09-22 ENCOUNTER — Encounter: Payer: Self-pay | Admitting: Physical Therapy

## 2019-09-22 ENCOUNTER — Other Ambulatory Visit: Payer: Self-pay

## 2019-09-22 ENCOUNTER — Ambulatory Visit: Payer: Medicare Other | Admitting: Physical Therapy

## 2019-09-22 VITALS — HR 57

## 2019-09-22 DIAGNOSIS — R262 Difficulty in walking, not elsewhere classified: Secondary | ICD-10-CM | POA: Diagnosis not present

## 2019-09-22 DIAGNOSIS — R2681 Unsteadiness on feet: Secondary | ICD-10-CM

## 2019-09-22 DIAGNOSIS — M6281 Muscle weakness (generalized): Secondary | ICD-10-CM | POA: Diagnosis not present

## 2019-09-22 DIAGNOSIS — R2689 Other abnormalities of gait and mobility: Secondary | ICD-10-CM | POA: Diagnosis not present

## 2019-09-22 NOTE — Therapy (Signed)
[facility] [address]  [place], [place], [address] Phone: [phone]   Fax:  [phone]  Physical Therapy Treatment  Patient Details  Name: Brandy Roberts MRN: [id number] Date of Birth: 1970-07-22 Referring Provider (PT): [name], NP   Encounter Date: 09/22/2019  PT End of Session - 09/22/19 1059    Visit Number  18    Number of Visits  20    Date for PT Re-Evaluation  10/05/19    Authorization Type  [employer]HC Medicare & Medicaid    PT Start Time  1017    PT Stop Time  1057    PT Time Calculation (min)  40 min    Equipment Utilized During Treatment  Gait belt   L AFO   Activity Tolerance  Patient tolerated treatment well    Behavior During Therapy  [facility] for tasks assessed/performed       Past Medical History:  Diagnosis Date  . Anemia   . Anxiety   . Chronic ischemic vertebrobasilar artery thalamic stroke   . Depression   . Hyperlipidemia   . Hypertension   . Migraines   . Nonrheumatic aortic (valve) insufficiency   . Osteoarthritis   . Sleep apnea   . Stroke [facility]) 10/25/2016    Past Surgical History:  Procedure Laterality Date  . CESAREAN SECTION    . GANGLION CYST EXCISION    . TEE WITHOUT CARDIOVERSION N/A 10/09/2014   Procedure: TRANSESOPHAGEAL ECHOCARDIOGRAM (TEE);  Surgeon: Sanda Klein, MD;  Location: [facility] ENDOSCOPY;  Service: Cardiovascular;  Laterality: N/A;  . TONSILLECTOMY      Vitals:   09/22/19 1019  Pulse: (!) 57  SpO2: 100%    Subjective Assessment - 09/22/19 1019    Subjective  "I feel lovely." Got her L AFO and some new shoes on Monday. Notes that her knees have not buckled since using her brace.    Pertinent History  stroke, OA, migraines, HTN, HLD, depression, anxiety, anemia, ganglion cyst excision    Diagnostic tests  10/11/18 brain MRI: No acute intracranial abnormality.Unchanged small chronic infarcts.    Patient Stated Goals  work on walking without falling     Currently in Pain?  No/denies                       [facility] Adult PT Treatment/Exercise - 09/22/19 0001      Ambulation/Gait   Ambulation Distance (Feet)  180 Feet    Assistive device  Straight cane;4-wheeled walker    Gait Pattern  Step-to pattern;Step-through pattern;Decreased hip/knee flexion - left;Decreased stance time - left;Decreased weight shift to left;Left genu recurvatum;Poor foot clearance - left    Ambulation Surface  Level;Indoor    Gait Comments  much improved L knee stability and foot clearance with AFO      Neuro Re-ed    Neuro Re-ed Details   grapevine       Knee/Hip Exercises: Aerobic   Nustep  Lvl 3, 6 min (LE/UE)      Knee/Hip Exercises: Standing   Hip ADduction  Strengthening;Left;10 reps;Right;2 sets    Hip ADduction Limitations  2nd set with yellow TB around ankle; at TM rail    Hip Abduction  Stengthening;Left;1 set;10 reps;Knee straight;Right    Abduction Limitations  yellow TB around ankles    Gait Training  with SPC and AFO- stepping over/around obstacles, on compliant surfaces, picking up items from floor x8 min  PT Education - 09/22/19 1058    Education Details  update to HEP; advised patient to practice walking with [facility] and AFO while at home, while continuing to use 4WW outside    [facility]) Educated  Patient    Methods  Explanation;Demonstration;Tactile cues;Verbal cues;Handout    Comprehension  Verbalized understanding;Returned demonstration       PT Short Term Goals - 08/24/19 0807      PT SHORT TERM GOAL #1   Title  Patient to be independent with initial HEP.    Time  3    Period  Weeks    Status  Achieved    Target Date  07/18/19        PT Long Term Goals - 08/24/19 1610      PT LONG TERM GOAL #1   Title  Patient to be independent with advanced HEP.    Time  6    Period  Weeks    Status  Partially Met   met for current; compliance varied d/t fatigue   Target Date  10/05/19      PT LONG TERM GOAL  #2   Title  Patient to demonstrate >=4+/5 strength in R LE and >=3+/5 strength in L LE.     Time  6    Period  Weeks    Status  Partially Met   improved L hip flexion and adduction; still showing limitations in musculature surrounding L knee and ankle   Target Date  10/05/19      PT LONG TERM GOAL #3   Title  Patient to demonstrate TUG testing in <14 sec with LRAD to indicate decreased fall risk.    Time  6    Period  Weeks    Status  On-going   16.5 sec with 4WW   Target Date  10/05/19      PT LONG TERM GOAL #4   Title  Patient to score >44/56 on Berg balance test with use of quad cane to demonstrate decreased fall risk.     Time  4    Period  Weeks    Status  Achieved   46/56 with 9UE     PT LONG TERM GOAL #5   Title  Patient to demonstrate stair climbing up/down 13 steps with 1 handrail and quad cane with good stability and safe sequencing.     Time  6    Period  Weeks    Status  On-going   able to perform with heavy cueing for safe SPC sequencing   Target Date  10/05/19      PT LONG TERM GOAL #6   Title  Patient to perform 5xSTS in </= 20 sec without use of armrests in order to decrease risk of falls.    Time  6    Period  Weeks    Status  On-going   23.8 sec without UE support   Target Date  10/05/19            Plan - 09/22/19 1059    Clinical Impression Statement  Patient arrived to session with L AFO donned. Noting that her L knee has not buckled since using her AFO. Notes that she feels comfortable donning the brace and denies questions. Worked on Personnel officer with [id number] and SPC. Patient demonstrated an improvement in L knee stability and foot clearance with AFO. Able to ambulate over/around obstacles with excellent stability. Did require demonstration of proper SPC sequencing when stepping up and over airex  pads but with good carryover. Able to perform resisted hip adduction and abduction with improvement in coordination and form. Updated this exercise into HEP  with addition of banded resistance as patient performed this well today. Patent reported understanding and without complaints at end of session. Advised patient to practice walking with [employer] and AFO while at home, while continuing to use 4WW outside- Patient agreeable.    Comorbidities  stroke, OA, migraines, HTN, HLD, depression, anxiety, anemia, ganglion cyst excision    Rehab Potential  Good    PT Frequency  1x / week    PT Duration  6 weeks    PT Treatment/Interventions  ADLs/Self Care Home Management;Cryotherapy;Electrical Stimulation;Moist Heat;Balance training;Therapeutic exercise;Therapeutic activities;Functional mobility training;Stair training;Gait training;Ultrasound;Neuromuscular re-education;Patient/family education;Manual techniques;Vasopneumatic Device;Taping;Splinting;Energy conservation;Passive range of motion    PT Next Visit Plan  stair training with cane; continued L quad/glute strengthening progressed to standing TKE with band resistance, low forward/side step ups for stairs training, progressing SLS to be dynamic, if tolerated.    PT Home Exercise Plan  06/29/19 - Seated gastroc stretch, RW standing march; 07/06/19 - Standing heel raise, STS from chair at counter;  07/18/19 - with chair support or RW:  standing hip abduction (x 5 reps), standing HS curl, RW LE march    Consulted and Agree with Plan of Care  Patient       Patient will benefit from skilled therapeutic intervention in order to improve the following deficits and impairments:  Abnormal gait, Decreased knowledge of precautions, Decreased activity tolerance, Decreased strength, Decreased balance, Decreased mobility, Difficulty walking, Improper body mechanics, Decreased range of motion, Impaired flexibility, Postural dysfunction  Visit Diagnosis: Unsteadiness on feet  Other abnormalities of gait and mobility  Difficulty in walking, not elsewhere classified  Muscle weakness (generalized)     Problem  List Patient Active Problem List   Diagnosis Date Noted  . Palpitations 10/12/2018  . Chest discomfort 10/12/2018  . Syncope and collapse 10/07/2018  . Migraine 05/21/2018  . Snoring 05/02/2015  . Essential hypertension 05/02/2015  . Headache 05/01/2015  . Former smoker 01/24/2015  . Left hemiparesis ([place]) 10/23/2014  . Cardiomyopathy due to hypertension ([place]) 10/23/2014  . Chronic ischemic vertebrobasilar artery thalamic stroke   . Hypertensive heart disease   . Overweight (BMI 25.0-29.9)   . H/O noncompliance with medical treatment, presenting hazards to health   . Aortic regurgitation   . History of ischemic middle cerebral artery stroke embolic   . Hyperlipidemia      Janene Harvey, PT, DPT 09/22/19 11:55 AM   [facility] [address]  [place], [place], [address] Phone: [phone]   Fax:  [phone]  Name: Gaylia Kassel MRN: [id number] Date of Birth: [birth year]

## 2019-09-26 ENCOUNTER — Encounter: Payer: Medicare Other | Admitting: Physical Therapy

## 2019-10-03 ENCOUNTER — Encounter: Payer: Medicare Other | Admitting: Physical Therapy

## 2019-10-04 ENCOUNTER — Ambulatory Visit: Payer: Medicare Other | Admitting: Physical Therapy

## 2019-10-05 ENCOUNTER — Other Ambulatory Visit: Payer: Self-pay

## 2019-10-05 ENCOUNTER — Emergency Department (HOSPITAL_BASED_OUTPATIENT_CLINIC_OR_DEPARTMENT_OTHER)
Admission: EM | Admit: 2019-10-05 | Discharge: 2019-10-05 | Disposition: A | Discharge status: Home / Self Care | Payer: Medicare Other | Attending: Emergency Medicine | Admitting: Emergency Medicine

## 2019-10-05 ENCOUNTER — Encounter (HOSPITAL_BASED_OUTPATIENT_CLINIC_OR_DEPARTMENT_OTHER): Payer: Self-pay

## 2019-10-05 DIAGNOSIS — Z79899 Other long term (current) drug therapy: Secondary | ICD-10-CM | POA: Insufficient documentation

## 2019-10-05 DIAGNOSIS — R112 Nausea with vomiting, unspecified: Secondary | ICD-10-CM | POA: Insufficient documentation

## 2019-10-05 DIAGNOSIS — I10 Essential (primary) hypertension: Secondary | ICD-10-CM | POA: Diagnosis not present

## 2019-10-05 DIAGNOSIS — Z87891 Personal history of nicotine dependence: Secondary | ICD-10-CM | POA: Insufficient documentation

## 2019-10-05 DIAGNOSIS — R197 Diarrhea, unspecified: Secondary | ICD-10-CM | POA: Insufficient documentation

## 2019-10-05 LAB — URINALYSIS, ROUTINE W REFLEX MICROSCOPIC
Glucose, UA: 100 mg/dL — AB
Ketones, ur: 15 mg/dL — AB
Leukocytes,Ua: NEGATIVE
Nitrite: NEGATIVE
Protein, ur: >300 mg/dL — AB
Specific Gravity, Urine: >1.03 — ABNORMAL HIGH (ref 1.005–1.030)
pH: 6 (ref 5.0–8.0)

## 2019-10-05 LAB — CBC
HCT: 39.6 % (ref 36.0–46.0)
Hemoglobin: 13.2 g/dL (ref 12.0–15.0)
MCH: 28.6 pg (ref 26.0–34.0)
MCHC: 33.3 g/dL (ref 30.0–36.0)
MCV: 85.9 fL (ref 80.0–100.0)
Platelets: 225 10*3/uL (ref 150–400)
RBC: 4.61 MIL/uL (ref 3.87–5.11)
RDW: 14.1 % (ref 11.5–15.5)
WBC: 12.7 10*3/uL — ABNORMAL HIGH (ref 4.0–10.5)
nRBC: 0 % (ref 0.0–0.2)

## 2019-10-05 LAB — COMPREHENSIVE METABOLIC PANEL
ALT: 23 U/L (ref 0–44)
AST: 25 U/L (ref 15–41)
Albumin: 4.4 g/dL (ref 3.5–5.0)
Alkaline Phosphatase: 80 U/L (ref 38–126)
Anion gap: 15 (ref 5–15)
BUN: 39 mg/dL — ABNORMAL HIGH (ref 6–20)
CO2: 24 mmol/L (ref 22–32)
Calcium: 9.5 mg/dL (ref 8.9–10.3)
Chloride: 100 mmol/L (ref 98–111)
Creatinine, Ser: 1.47 mg/dL — ABNORMAL HIGH (ref 0.44–1.00)
GFR calc Af Amer: 48 mL/min — ABNORMAL LOW (ref >60)
GFR calc non Af Amer: 41 mL/min — ABNORMAL LOW (ref >60)
Glucose, Bld: 137 mg/dL — ABNORMAL HIGH (ref 70–99)
Potassium: 3.1 mmol/L — ABNORMAL LOW (ref 3.5–5.1)
Sodium: 139 mmol/L (ref 135–145)
Total Bilirubin: 0.6 mg/dL (ref 0.3–1.2)
Total Protein: 8.3 g/dL — ABNORMAL HIGH (ref 6.5–8.1)

## 2019-10-05 LAB — URINALYSIS, MICROSCOPIC (REFLEX)

## 2019-10-05 LAB — LIPASE, BLOOD: Lipase: 26 U/L (ref 11–51)

## 2019-10-05 LAB — PREGNANCY, URINE: Preg Test, Ur: NEGATIVE

## 2019-10-05 MED ORDER — SODIUM CHLORIDE 0.9% FLUSH
3.0000 mL | Freq: Once | INTRAVENOUS | Status: DC
  Filled 2019-10-05: qty 3

## 2019-10-05 MED ORDER — PROMETHAZINE HCL 25 MG/ML IJ SOLN
12.5000 mg | Freq: Once | INTRAMUSCULAR | Status: AC
  Administered 2019-10-05: 12.5 mg via INTRAVENOUS
  Filled 2019-10-05: qty 1

## 2019-10-05 MED ORDER — HYDROMORPHONE HCL 1 MG/ML IJ SOLN
0.5000 mg | Freq: Once | INTRAMUSCULAR | Status: AC
  Administered 2019-10-05: 0.5 mg via INTRAVENOUS
  Filled 2019-10-05: qty 1

## 2019-10-05 MED ORDER — LACTATED RINGERS IV BOLUS
1000.0000 mL | Freq: Once | INTRAVENOUS | Status: AC
  Administered 2019-10-05: 1000 mL via INTRAVENOUS

## 2019-10-05 NOTE — ED Triage Notes (Addendum)
Pt c/o abd pain, n/v/d x 3 days-pt states LWBS [facility] ED-to triage in w/c

## 2019-10-05 NOTE — ED Notes (Signed)
States went on vacation and since  Then she  Has had N/v/d since , pt arriives vomiting and then states she has to poop

## 2019-10-06 ENCOUNTER — Encounter: Payer: Self-pay | Admitting: Physical Therapy

## 2019-10-06 ENCOUNTER — Ambulatory Visit: Payer: Medicare Other | Admitting: Physical Therapy

## 2019-10-06 VITALS — BP 80/50 | HR 77

## 2019-10-06 DIAGNOSIS — I639 Cerebral infarction, unspecified: Secondary | ICD-10-CM | POA: Diagnosis not present

## 2019-10-06 DIAGNOSIS — R2689 Other abnormalities of gait and mobility: Secondary | ICD-10-CM | POA: Insufficient documentation

## 2019-10-06 DIAGNOSIS — R2681 Unsteadiness on feet: Secondary | ICD-10-CM | POA: Insufficient documentation

## 2019-10-06 DIAGNOSIS — M6281 Muscle weakness (generalized): Secondary | ICD-10-CM | POA: Insufficient documentation

## 2019-10-06 DIAGNOSIS — R262 Difficulty in walking, not elsewhere classified: Secondary | ICD-10-CM | POA: Insufficient documentation

## 2019-10-06 NOTE — Therapy (Signed)
[facility] [address]  [place], [place], [address] Phone: [phone]   Fax:  [phone]  Patient Details  Name: Brandy Roberts MRN: [id number] Date of Birth: [birth year] Referring Provider:  [name], NP  Encounter Date: 10/06/2019    Vitals:   10/06/19 1316  BP: (!) 80/50  Pulse: 77  SpO2: 96%     Patient arrived to session looking lethargic and having trouble transferring from seat and walking into clinic. Had an ED visit last night for nausea, vomiting, and diarrhea- patient reports that she has a stomach virus. Patient hypotensive at start of appointment and not feeling well, thus did not proceed with session. Had trouble ambulating out of clinic d/t fatigue and L LE trembling, thus was safely transported in w/c to her transportation downstairs. Patient scheduled for f/u with PCP on 10/17/19- will notify PCP of patient's status today.     Janene Harvey, PT, DPT 10/06/19 1:49 PM    [facility] [address]  [place], [place], [address] Phone: [phone]   Fax:  [phone]

## 2019-10-10 NOTE — ED Provider Notes (Signed)
[place] EMERGENCY DEPARTMENT Provider Note   CSN: [id number] Arrival date & time: 10/05/19  1745     History Chief Complaint  Patient presents with  . Abdominal Pain    Charlot Boepple is a 49 y.o. female.  HPI   49 year old female with nausea/vomiting/diarrhea.  Onset couple days ago while returning from vacation in [place].  Some crampy intermittent abdominal pain.  No fevers or chills.  No urinary complaints.  No sick contacts that she is aware of.  No acute respiratory complaints.  Past Medical History:  Diagnosis Date  . Anemia   . Anxiety   . Chronic ischemic vertebrobasilar artery thalamic stroke   . Depression   . Hyperlipidemia   . Hypertension   . Migraines   . Nonrheumatic aortic (valve) insufficiency   . Osteoarthritis   . Sleep apnea   . Stroke [facility]) 10/25/2016    Patient Active Problem List   Diagnosis Date Noted  . Palpitations 10/12/2018  . Chest discomfort 10/12/2018  . Syncope and collapse 10/07/2018  . Migraine 05/21/2018  . Snoring 05/02/2015  . Essential hypertension 05/02/2015  . Headache 05/01/2015  . Former smoker 01/24/2015  . Left hemiparesis ([place]) 10/23/2014  . Cardiomyopathy due to hypertension ([place]) 10/23/2014  . Chronic ischemic vertebrobasilar artery thalamic stroke   . Hypertensive heart disease   . Overweight (BMI 25.0-29.9)   . H/O noncompliance with medical treatment, presenting hazards to health   . Aortic regurgitation   . History of ischemic middle cerebral artery stroke embolic   . Hyperlipidemia     Past Surgical History:  Procedure Laterality Date  . CESAREAN SECTION    . GANGLION CYST EXCISION    . TEE WITHOUT CARDIOVERSION N/A 10/09/2014   Procedure: TRANSESOPHAGEAL ECHOCARDIOGRAM (TEE);  Surgeon: Sanda Klein, MD;  Location: [facility] ENDOSCOPY;  Service: Cardiovascular;  Laterality: N/A;  . TONSILLECTOMY       OB History   No obstetric history on file.     Family History  Problem Relation Age of  Onset  . Cancer Mother        type unknown  . Hypertension Mother   . Hypertension Sister   . Diabetes Sister   . Cancer Maternal Aunt        4 aunts died of cancer types unknown    Social History   Tobacco Use  . Smoking status: Former Smoker    Packs/day: 0.25    Quit date: 08/31/2017    Years since quitting: 2.1  . Smokeless tobacco: Never Used  Substance Use Topics  . Alcohol use: No    Alcohol/week: 0.0 standard drinks  . Drug use: Yes    Types: Marijuana    Home Medications Prior to Admission medications   Medication Sig Start Date End Date Taking? Authorizing Provider  amLODipine (NORVASC) 10 MG tablet TAKE 1 TABLET BY MOUTH EVERY DAY 08/05/19   Charlott Rakes, MD  atorvastatin (LIPITOR) 80 MG tablet TAKE 1 TABLET BY MOUTH EVERY DAY AT [facility] 09/20/19   [name], NP  Elastic Bandages & Supports (WRIST/THUMB SPLINT/RIGHT MED) MISC 1 each by Does not apply route daily. 05/04/19   McClung, Dionne Bucy, PA-C  EMGALITY 120 MG/ML SOAJ Inject 130 mg into the skin every 30 (thirty) days. 10/29/18   [provider]  flurbiprofen (ANSAID) 100 MG tablet TAKE 1 TABLET BY MOUTH EVERY 8 HOURS AS NEEDED (MAX 3 TABS IN 24 HOURS) 09/01/19   [name], NP  gabapentin (NEURONTIN)  300 MG capsule TAKE 1 CAPSULE BY MOUTH TWICE DAILY 09/05/19   [name], NP  hydrALAZINE (APRESOLINE) 50 MG tablet Take 1 tablet (50 mg total) by mouth 3 (three) times daily. 06/19/19 09/17/19  [name], NP  hydrochlorothiazide (MICROZIDE) 12.5 MG capsule Take 1 capsule (12.5 mg total) by mouth daily. 06/22/19 09/20/19  Revankar, Reita Cliche, MD  lisinopril (ZESTRIL) 40 MG tablet TAKE 1 TABLET BY MOUTH EVERY DAY 09/20/19   [name], NP  metoCLOPramide (REGLAN) 10 MG tablet Take 1 tablet (10 mg total) by mouth every 6 (six) hours as needed for nausea (and headache). 12/15/17   [name], NP  metoprolol tartrate (LOPRESSOR) 25 MG tablet Take 1 tablet (25 mg total) by mouth 2 (two) times  daily. 06/19/19 09/17/19  [name], NP  Misc. Devices MISC Please provide patient with insurance approved blood pressure monitor. 10/06/18   [name], NP  Misc. Devices MISC Please provide Left foot AFO M21.372 G81.94 08/12/19   [name], NP  mupirocin ointment (BACTROBAN) 2 % Apply 1 application topically 2 (two) times daily. 06/17/19   [name], NP  nitroGLYCERIN (NITROSTAT) 0.4 MG SL tablet Place 1 tablet (0.4 mg total) under the tongue every 5 (five) minutes as needed for chest pain. 07/18/19 10/16/19  Revankar, Reita Cliche, MD  pantoprazole (PROTONIX) 20 MG tablet TAKE 1 TABLET BY MOUTH DAILY 09/01/19   [name], NP  spironolactone (ALDACTONE) 25 MG tablet Take 1 tablet (25 mg total) by mouth daily. 06/19/19 09/17/19  [name], NP  traZODone (DESYREL) 100 MG tablet TAKE 1 TABLET BY MOUTH AT BEDTIME 08/05/19   Charlott Rakes, MD  venlafaxine XR (EFFEXOR-XR) 75 MG 24 hr capsule TAKE 3 CAPSULES(225MG  TOTAL) BY MOUTH DAILY 03/01/19   Pieter Partridge, DO    Allergies    Ibuprofen and Tylenol [acetaminophen]  Review of Systems   Review of Systems All systems reviewed and negative, other than as noted in HPI.  Physical Exam Updated Vital Signs BP (!) 146/74 (BP Location: Right Arm)   Pulse 83   Temp 99.6 F (37.6 C) (Oral)   Resp 16   SpO2 100%   Physical Exam Vitals and nursing note reviewed.  Constitutional:      General: She is not in acute distress.    Appearance: She is well-developed.  HENT:     Head: Normocephalic and atraumatic.  Eyes:     General:        Right eye: No discharge.        Left eye: No discharge.     Conjunctiva/sclera: Conjunctivae normal.  Cardiovascular:     Rate and Rhythm: Normal rate and regular rhythm.     Heart sounds: Normal heart sounds. No murmur. No friction rub. No gallop.   Pulmonary:     Effort: Pulmonary effort is normal. No respiratory distress.     Breath sounds: Normal breath sounds.  Abdominal:      General: There is no distension.     Palpations: Abdomen is soft.     Tenderness: There is no abdominal tenderness.  Musculoskeletal:        General: No tenderness.     Cervical back: Neck supple.  Skin:    General: Skin is warm and dry.  Neurological:     Mental Status: She is alert.  Psychiatric:        Behavior: Behavior normal.  Thought Content: Thought content normal.     ED Results / Procedures / Treatments   Labs (all labs ordered are listed, but only abnormal results are displayed) Labs Reviewed  COMPREHENSIVE METABOLIC PANEL - Abnormal; Notable for the following components:      Result Value   Potassium 3.1 (*)    Glucose, Bld 137 (*)    BUN 39 (*)    Creatinine, Ser 1.47 (*)    Total Protein 8.3 (*)    GFR calc non Af Amer 41 (*)    GFR calc Af Amer 48 (*)    All other components within normal limits  CBC - Abnormal; Notable for the following components:   WBC 12.7 (*)    All other components within normal limits  URINALYSIS, ROUTINE W REFLEX MICROSCOPIC - Abnormal; Notable for the following components:   APPearance CLOUDY (*)    Specific Gravity, Urine >1.030 (*)    Glucose, UA 100 (*)    Hgb urine dipstick MODERATE (*)    Bilirubin Urine MODERATE (*)    Ketones, ur 15 (*)    Protein, ur >300 (*)    All other components within normal limits  URINALYSIS, MICROSCOPIC (REFLEX) - Abnormal; Notable for the following components:   Bacteria, UA MANY (*)    All other components within normal limits  LIPASE, BLOOD  PREGNANCY, URINE    EKG None  Radiology No results found.   No results found.  Procedures Procedures (including critical care time)  Medications Ordered in ED Medications  promethazine (PHENERGAN) injection 12.5 mg (12.5 mg Intravenous Given 10/05/19 1854)  HYDROmorphone (DILAUDID) injection 0.5 mg (0.5 mg Intravenous Given 10/05/19 1853)  lactated ringers bolus 1,000 mL (0 mLs Intravenous Stopped 10/05/19 2006)    ED Course  I have  reviewed the triage vital signs and the nursing notes.  Pertinent labs & imaging results that were available during my care of the patient were reviewed by me and considered in my medical decision making (see chart for details).    MDM Rules/Calculators/A&P                      49 year old female with nausea/vomiting/diarrhea.  Suspect viral GI illness.  Low suspicion for emergent process.  Feeling much better after symptomatic treatment.  Plan continued symptomatic treatment.  Return precautions were discussed.  Outpatient follow-up otherwise. Final Clinical Impression(s) / ED Diagnoses Final diagnoses:  Nausea vomiting and diarrhea    Rx / DC Orders ED Discharge Orders    None       Virgel Manifold, MD 10/10/19 1234

## 2019-10-13 ENCOUNTER — Encounter: Payer: Self-pay | Admitting: Physical Therapy

## 2019-10-13 ENCOUNTER — Other Ambulatory Visit: Payer: Self-pay

## 2019-10-13 ENCOUNTER — Ambulatory Visit: Payer: Medicare Other | Attending: Nurse Practitioner | Admitting: Physical Therapy

## 2019-10-13 VITALS — BP 110/54 | HR 60

## 2019-10-13 DIAGNOSIS — R2681 Unsteadiness on feet: Secondary | ICD-10-CM | POA: Diagnosis not present

## 2019-10-13 DIAGNOSIS — R2689 Other abnormalities of gait and mobility: Secondary | ICD-10-CM | POA: Diagnosis not present

## 2019-10-13 DIAGNOSIS — M6281 Muscle weakness (generalized): Secondary | ICD-10-CM | POA: Diagnosis not present

## 2019-10-13 DIAGNOSIS — R262 Difficulty in walking, not elsewhere classified: Secondary | ICD-10-CM | POA: Diagnosis not present

## 2019-10-13 NOTE — Therapy (Signed)
[facility] [address]  [place], [place], [address] Phone: [phone]   Fax:  [phone]  Physical Therapy Discharge Summary  Patient Details  Name: Brandy Roberts MRN: [id number] Date of Birth: [birth year] Referring Provider (PT): [name], NP   Progress Note Reporting Period 08/05/19 to 10/13/19  See note below for Objective Data and Assessment of Progress/Goals.      Encounter Date: 10/13/2019  PT End of Session - 10/13/19 1157    Visit Number  19    Number of Visits  20    Date for PT Re-Evaluation  10/05/19    Authorization Type  [employer] Medicare & Medicaid    PT Start Time  1100    PT Stop Time  1149    PT Time Calculation (min)  49 min    Equipment Utilized During Treatment  Gait belt   L AFO   Activity Tolerance  Patient tolerated treatment well    Behavior During Therapy  WFL for tasks assessed/performed       Past Medical History:  Diagnosis Date  . Anemia   . Anxiety   . Chronic ischemic vertebrobasilar artery thalamic stroke   . Depression   . Hyperlipidemia   . Hypertension   . Migraines   . Nonrheumatic aortic (valve) insufficiency   . Osteoarthritis   . Sleep apnea   . Stroke [facility]) 10/25/2016    Past Surgical History:  Procedure Laterality Date  . CESAREAN SECTION    . GANGLION CYST EXCISION    . TEE WITHOUT CARDIOVERSION N/A 10/09/2014   Procedure: TRANSESOPHAGEAL ECHOCARDIOGRAM (TEE);  Surgeon: Sanda Klein, MD;  Location: [facility] ENDOSCOPY;  Service: Cardiovascular;  Laterality: N/A;  . TONSILLECTOMY      Vitals:   10/13/19 1102  BP: (!) 110/54  Pulse: 60  SpO2: 93%    Subjective Assessment - 10/13/19 1106    Subjective  "I feel a whole lot better" since last session. No symptoms today. PCP's nurse followed up with her to make sure she was doing better. Feels like she is moving around about the same as when she began therapy.    Pertinent History  stroke, OA,  migraines, HTN, HLD, depression, anxiety, anemia, ganglion cyst excision    Diagnostic tests  10/11/18 brain MRI: No acute intracranial abnormality.Unchanged small chronic infarcts.    Patient Stated Goals  work on walking without falling    Currently in Pain?  No/denies         [facility] PT Assessment - 10/13/19 0001      Strength   Right Hip Flexion  4+/5    Right Hip ABduction  4+/5   inconsistent resistance   Right Hip ADduction  4+/5   inconsistent resistance   Left Hip Flexion  4-/5    Left Hip ABduction  3+/5    Left Hip ADduction  4-/5    Right Knee Flexion  5/5    Right Knee Extension  5/5    Left Knee Flexion  3/5    Left Knee Extension  4-/5    Right Ankle Dorsiflexion  4+/5    Right Ankle Plantar Flexion  4/5    Left Ankle Dorsiflexion  3+/5    Left Ankle Plantar Flexion  2+/5      Standardized Balance Assessment   Five times sit to stand comments   13.27    without UEs      Timed Up and Go Test  Normal TUG (seconds)  15.1   with 4WW and AFO                   [facility] Adult PT Treatment/Exercise - 10/13/19 0001      Ambulation/Gait   Stairs  Yes    Stairs Assistance  5: Supervision    Stair Management Technique  One rail Left;Step to pattern;With cane    Number of Stairs  14    Height of Stairs  8    Gait Comments  cues for proper cane sequencing required, but able to perform safely and with good stability      Knee/Hip Exercises: Aerobic   Nustep  Lvl 4, 6 min (LE/UE)             PT Education - 10/13/19 1154    Education Details  discussion on objective progress and remaining limitations; consolidation of HEP and discussed importance of maintaining compliance with HEP 4x/week for maintenance    Person(s) Educated  Patient    Methods  Explanation;Demonstration;Tactile cues;Verbal cues;Handout    Comprehension  Verbalized understanding       PT Short Term Goals - 10/13/19 1113      PT SHORT TERM GOAL #1   Title  Patient to be  independent with initial HEP.    Time  3    Period  Weeks    Status  Achieved    Target Date  07/18/19        PT Long Term Goals - 10/13/19 1113      PT LONG TERM GOAL #1   Title  Patient to be independent with advanced HEP.    Time  6    Period  Weeks    Status  Achieved      PT LONG TERM GOAL #2   Title  Patient to demonstrate >=4+/5 strength in R LE and >=3+/5 strength in L LE.     Time  6    Period  Weeks    Status  Partially Met   improved in L knee extension and ankle dorsiflexion     PT LONG TERM GOAL #3   Title  Patient to demonstrate TUG testing in <14 sec with LRAD to indicate decreased fall risk.    Time  6    Period  Weeks    Status  Partially Met   15.1 sec TUG with 4WW and AFO     PT LONG TERM GOAL #4   Title  Patient to score >44/56 on Berg balance test with use of quad cane to demonstrate decreased fall risk.     Time  4    Period  Weeks    Status  Achieved   46/56 with 6HU     PT LONG TERM GOAL #5   Title  Patient to demonstrate stair climbing up/down 13 steps with 1 handrail and quad cane with good stability and safe sequencing.     Time  6    Period  Weeks    Status  Partially Met   able to perform with good stability with SPC and 1 handrail, but requiring SPC sequencing initially with good carryover     PT LONG TERM GOAL #6   Title  Patient to perform 5xSTS in </= 20 sec without use of armrests in order to decrease risk of falls.    Time  6    Period  Weeks    Status  Achieved   13.27 sec without UE  support           Plan - 10/13/19 1207    Clinical Impression Statement  Patient reports feeling better since last session. Diastolic BP still slightly low, but patient asymptomatic. Patient has met or partially met all goals at this time. L LE weakness is still evident, but much improved since initial eval. Patient displays consistent and correct use of her AFO and 4WW since starting therapy which have tremendously improved her stability  with transfers and ambulation. Patient also demonstrated stable gait pattern with SPC today and was even able to walk without AD for short distances, for example- walking to the restroom. Patient still required initial cueing for proper SPC sequencing with stair navigation today, but otherwise appeared safe. Consolidated HEP for max benefit and educated patient on importance of continued compliance with HEP for maintenance of strength and functional activity tolerance. Patient reported understanding. Patient initially apprehensive to wrapping up as she initially stated that she felt that she had not improved much with therapy. However after reviewing objective progress with patient, she agreed to discharge. Explained that she is welcome to return if relapse occurs.    Comorbidities  stroke, OA, migraines, HTN, HLD, depression, anxiety, anemia, ganglion cyst excision    Rehab Potential  Good    PT Frequency  1x / week    PT Duration  6 weeks    PT Treatment/Interventions  ADLs/Self Care Home Management;Cryotherapy;Electrical Stimulation;Moist Heat;Balance training;Therapeutic exercise;Therapeutic activities;Functional mobility training;Stair training;Gait training;Ultrasound;Neuromuscular re-education;Patient/family education;Manual techniques;Vasopneumatic Device;Taping;Splinting;Energy conservation;Passive range of motion    PT Next Visit Plan  DC at this time    PT Home Exercise Plan  06/29/19 - Seated gastroc stretch, RW standing march; 07/06/19 - Standing heel raise, STS from chair at counter;  07/18/19 - with chair support or RW:  standing hip abduction (x 5 reps), standing HS curl, RW LE march    Consulted and Agree with Plan of Care  Patient       Patient will benefit from skilled therapeutic intervention in order to improve the following deficits and impairments:  Abnormal gait, Decreased knowledge of precautions, Decreased activity tolerance, Decreased strength, Decreased balance, Decreased  mobility, Difficulty walking, Improper body mechanics, Decreased range of motion, Impaired flexibility, Postural dysfunction  Visit Diagnosis: Unsteadiness on feet  Other abnormalities of gait and mobility  Difficulty in walking, not elsewhere classified  Muscle weakness (generalized)     Problem List Patient Active Problem List   Diagnosis Date Noted  . Palpitations 10/12/2018  . Chest discomfort 10/12/2018  . Syncope and collapse 10/07/2018  . Migraine 05/21/2018  . Snoring 05/02/2015  . Essential hypertension 05/02/2015  . Headache 05/01/2015  . Former smoker 01/24/2015  . Left hemiparesis ([place]) 10/23/2014  . Cardiomyopathy due to hypertension ([place]) 10/23/2014  . Chronic ischemic vertebrobasilar artery thalamic stroke   . Hypertensive heart disease   . Overweight (BMI 25.0-29.9)   . H/O noncompliance with medical treatment, presenting hazards to health   . Aortic regurgitation   . History of ischemic middle cerebral artery stroke embolic   . Hyperlipidemia      PHYSICAL THERAPY DISCHARGE SUMMARY  Visits from Start of Care: 19  Current functional level related to goals / functional outcomes: See above clinical impression   Remaining deficits: Decreased L LE strength, decreased gait speed, difficulty carrying over [facility] sequencing with stair navigation    Education / Equipment: HEP  Plan: Patient agrees to discharge.  Patient goals were partially met. Patient is being discharged  due to meeting the stated rehab goals.  ?????     Janene Harvey, PT, DPT 10/13/19 12:10 PM   [facility] [address]  [place], [place], [address] Phone: [phone]   Fax:  [phone]  Name: Aalyah Mansouri MRN: [id number] Date of Birth: 02-03-1971

## 2019-10-17 ENCOUNTER — Ambulatory Visit: Payer: Medicare Other | Admitting: Nurse Practitioner

## 2019-10-26 ENCOUNTER — Other Ambulatory Visit: Payer: Self-pay

## 2019-10-26 ENCOUNTER — Encounter: Payer: Self-pay | Admitting: Nurse Practitioner

## 2019-10-26 ENCOUNTER — Other Ambulatory Visit: Payer: Self-pay | Admitting: Nurse Practitioner

## 2019-10-26 ENCOUNTER — Ambulatory Visit: Payer: Medicare Other | Attending: Nurse Practitioner | Admitting: Nurse Practitioner

## 2019-10-26 VITALS — BP 112/67 | HR 65 | Temp 97.9°F | Wt 202.0 lb

## 2019-10-26 DIAGNOSIS — Z1211 Encounter for screening for malignant neoplasm of colon: Secondary | ICD-10-CM | POA: Diagnosis not present

## 2019-10-26 DIAGNOSIS — I10 Essential (primary) hypertension: Secondary | ICD-10-CM

## 2019-10-26 DIAGNOSIS — K2100 Gastro-esophageal reflux disease with esophagitis, without bleeding: Secondary | ICD-10-CM

## 2019-10-26 DIAGNOSIS — R7303 Prediabetes: Secondary | ICD-10-CM | POA: Diagnosis not present

## 2019-10-26 DIAGNOSIS — E785 Hyperlipidemia, unspecified: Secondary | ICD-10-CM

## 2019-10-26 DIAGNOSIS — B9689 Other specified bacterial agents as the cause of diseases classified elsewhere: Secondary | ICD-10-CM

## 2019-10-26 DIAGNOSIS — L089 Local infection of the skin and subcutaneous tissue, unspecified: Secondary | ICD-10-CM | POA: Diagnosis not present

## 2019-10-26 DIAGNOSIS — G4709 Other insomnia: Secondary | ICD-10-CM

## 2019-10-26 DIAGNOSIS — G629 Polyneuropathy, unspecified: Secondary | ICD-10-CM

## 2019-10-26 LAB — GLUCOSE, POCT (MANUAL RESULT ENTRY): POC Glucose: 97 mg/dl (ref 70–99)

## 2019-10-26 MED ORDER — HYDROCHLOROTHIAZIDE 12.5 MG PO CAPS: 12.5000 mg | ORAL_CAPSULE | Freq: Every day | ORAL | 3 refills | Status: DC

## 2019-10-26 MED ORDER — PANTOPRAZOLE SODIUM 20 MG PO TBEC: 20.0000 mg | DELAYED_RELEASE_TABLET | Freq: Every day | ORAL | 1 refills | Status: DC

## 2019-10-26 MED ORDER — SPIRONOLACTONE 25 MG PO TABS: 25.0000 mg | ORAL_TABLET | Freq: Every day | ORAL | 0 refills | Status: DC

## 2019-10-26 MED ORDER — GABAPENTIN 300 MG PO CAPS: 300.0000 mg | ORAL_CAPSULE | Freq: Two times a day (BID) | ORAL | 1 refills | Status: DC

## 2019-10-26 MED ORDER — LISINOPRIL 40 MG PO TABS: 40.0000 mg | ORAL_TABLET | Freq: Every day | ORAL | 1 refills | Status: DC

## 2019-10-26 MED ORDER — AMLODIPINE BESYLATE 10 MG PO TABS: 10.0000 mg | ORAL_TABLET | Freq: Every day | ORAL | 1 refills | Status: DC

## 2019-10-26 MED ORDER — ATORVASTATIN CALCIUM 80 MG PO TABS: ORAL_TABLET | ORAL | 1 refills | Status: DC

## 2019-10-26 MED ORDER — METOPROLOL TARTRATE 25 MG PO TABS: 25.0000 mg | ORAL_TABLET | Freq: Two times a day (BID) | ORAL | 1 refills | Status: DC

## 2019-10-26 MED ORDER — MUPIROCIN 2 % EX OINT: 1.0000 "application " | TOPICAL_OINTMENT | Freq: Two times a day (BID) | CUTANEOUS | 1 refills | Status: DC

## 2019-10-26 MED ORDER — HYDRALAZINE HCL 50 MG PO TABS: 50.0000 mg | ORAL_TABLET | Freq: Three times a day (TID) | ORAL | 1 refills | Status: DC

## 2019-10-26 MED ORDER — NITROGLYCERIN 0.4 MG SL SUBL: 0.4000 mg | SUBLINGUAL_TABLET | SUBLINGUAL | 3 refills | Status: DC | PRN

## 2019-10-26 MED ORDER — TRAZODONE HCL 100 MG PO TABS: 100.0000 mg | ORAL_TABLET | Freq: Every day | ORAL | 1 refills | Status: DC

## 2019-10-26 NOTE — Progress Notes (Signed)
Assessment & Plan:  Brandy Roberts was seen today for follow-up.  Diagnoses and all orders for this visit:  Essential hypertension -     amLODipine (NORVASC) 10 MG tablet; Take 1 tablet (10 mg total) by mouth daily. -     spironolactone (ALDACTONE) 25 MG tablet; Take 1 tablet (25 mg total) by mouth daily. -     hydrochlorothiazide (MICROZIDE) 12.5 MG capsule; Take 1 capsule (12.5 mg total) by mouth daily. -     lisinopril (ZESTRIL) 40 MG tablet; Take 1 tablet (40 mg total) by mouth daily. -     metoprolol tartrate (LOPRESSOR) 25 MG tablet; Take 1 tablet (25 mg total) by mouth 2 (two) times daily. -     hydrALAZINE (APRESOLINE) 50 MG tablet; Take 1 tablet (50 mg total) by mouth 3 (three) times daily.  Prediabetes -     Glucose (CBG)  Colon cancer screening -     Fecal occult blood, imunochemical([facility])  Bacterial skin infection -     mupirocin ointment (BACTROBAN) 2 %; Apply 1 application topically 2 (two) times daily.  Gastroesophageal reflux disease with esophagitis without hemorrhage -     pantoprazole (PROTONIX) 20 MG tablet; Take 1 tablet (20 mg total) by mouth daily.  Other insomnia -     traZODone (DESYREL) 100 MG tablet; Take 1 tablet (100 mg total) by mouth at bedtime.  Neuropathy -     gabapentin (NEURONTIN) 300 MG capsule; Take 1 capsule (300 mg total) by mouth 2 (two) times daily.  Dyslipidemia -     atorvastatin (LIPITOR) 80 MG tablet; TAKE 1 TABLET BY MOUTH EVERY DAY AT 6PM  Other orders -     nitroGLYCERIN (NITROSTAT) 0.4 MG SL tablet; Place 1 tablet (0.4 mg total) under the tongue every 5 (five) minutes as needed for chest pain.    Patient has been counseled on age-appropriate routine health concerns for screening and prevention. These are reviewed and up-to-date. Referrals have been placed accordingly. Immunizations are up-to-date or declined.    Subjective:   Chief Complaint  Patient presents with  . Follow-up    Pt. is here to follow up on  hypetension.    HPI Brandy Roberts 49 y.o. female presents to office today for follow up. She has a PMH significant for ischemic vertebrobasilar artery thalamic stroke, Depression, Hyperlipidemia, Hypertension, Migraines, Nonrheumatic aortic (valve) insufficiency/regurg (followed by Cardiology), Osteoarthritis, Sleep apnea   HEALTH MAINTENANCE Patient Declines Mammogram  Gait Instability Endorsing morning headaches.  Headaches do not last all day. She has a history of migraines. Not other associated symptoms. Unstable when she first gets up out of the bed. Has fallen several times. Using clawed cane in the home. She was just discharged on 5-13 from OP PT. At that time she was noted to have improved and was considered safe for discharge from therapy.  She has an appointment with neurology next week. She will also address her headaches during that visit. She is not experiencing a headache at this time.    Essential Hypertension Well controlled. BP at home runs 130-140/70s. Endorses medication adherence taking: amlodipine 10 mg , hydralazine 50 mg TID, lisinopril 40 mg daily, lopressor 25 mg BID and spironolactone 25 mg daily. Denies chest pain, shortness of breath, palpitations, or visual disturbances.  BP Readings from Last 3 Encounters:  10/26/19 112/67  10/13/19 (!) 110/54  10/06/19 (!) 80/50     Review of Systems  Constitutional: Negative for fever, malaise/fatigue and weight loss.  HENT: Negative.  Negative for nosebleeds.   Eyes: Negative.  Negative for blurred vision, double vision and photophobia.  Respiratory: Negative.  Negative for cough and shortness of breath.   Cardiovascular: Negative.  Negative for chest pain, palpitations and leg swelling.  Gastrointestinal: Negative.  Negative for heartburn, nausea and vomiting.  Musculoskeletal: Negative.  Negative for myalgias.  Neurological: Positive for headaches. Negative for dizziness, focal weakness and seizures.       SEE  HPI  Psychiatric/Behavioral: Negative.  Negative for suicidal ideas.    Past Medical History:  Diagnosis Date  . Anemia   . Anxiety   . Chronic ischemic vertebrobasilar artery thalamic stroke   . Depression   . Hyperlipidemia   . Hypertension   . Migraines   . Nonrheumatic aortic (valve) insufficiency   . Osteoarthritis   . Sleep apnea   . Stroke [facility]) 10/25/2016    Past Surgical History:  Procedure Laterality Date  . CESAREAN SECTION    . GANGLION CYST EXCISION    . TEE WITHOUT CARDIOVERSION N/A 10/09/2014   Procedure: TRANSESOPHAGEAL ECHOCARDIOGRAM (TEE);  Surgeon: Sanda Klein, MD;  Location: [facility] ENDOSCOPY;  Service: Cardiovascular;  Laterality: N/A;  . TONSILLECTOMY      Family History  Problem Relation Age of Onset  . Cancer Mother        type unknown  . Hypertension Mother   . Hypertension Sister   . Diabetes Sister   . Cancer Maternal Aunt        4 aunts died of cancer types unknown    Social History Reviewed with no changes to be made today.   Outpatient Medications Prior to Visit  Medication Sig Dispense Refill  . Elastic Bandages & Supports (WRIST/THUMB SPLINT/RIGHT MED) MISC 1 each by Does not apply route daily. 1 each 0  . EMGALITY 120 MG/ML SOAJ Inject 130 mg into the skin every 30 (thirty) days.    . metoCLOPramide (REGLAN) 10 MG tablet Take 1 tablet (10 mg total) by mouth every 6 (six) hours as needed for nausea (and headache). 30 tablet 2  . Misc. Devices MISC Please provide patient with insurance approved blood pressure monitor. 1 each 0  . Misc. Devices MISC Please provide Left foot AFO M21.372 G81.94 1 each 0  . venlafaxine XR (EFFEXOR-XR) 75 MG 24 hr capsule TAKE 3 CAPSULES(225MG  TOTAL) BY MOUTH DAILY 90 capsule 3  . amLODipine (NORVASC) 10 MG tablet TAKE 1 TABLET BY MOUTH EVERY DAY 90 tablet 0  . atorvastatin (LIPITOR) 80 MG tablet TAKE 1 TABLET BY MOUTH EVERY DAY AT 6PM 90 tablet 0  . flurbiprofen (ANSAID) 100 MG tablet TAKE 1 TABLET BY MOUTH  EVERY 8 HOURS AS NEEDED (MAX 3 TABS IN 24 HOURS) 60 tablet 0  . gabapentin (NEURONTIN) 300 MG capsule TAKE 1 CAPSULE BY MOUTH TWICE DAILY 180 capsule 0  . lisinopril (ZESTRIL) 40 MG tablet TAKE 1 TABLET BY MOUTH EVERY DAY 90 tablet 0  . mupirocin ointment (BACTROBAN) 2 % Apply 1 application topically 2 (two) times daily. 60 g 1  . pantoprazole (PROTONIX) 20 MG tablet TAKE 1 TABLET BY MOUTH DAILY 90 tablet 1  . traZODone (DESYREL) 100 MG tablet TAKE 1 TABLET BY MOUTH AT BEDTIME 90 tablet 0  . hydrALAZINE (APRESOLINE) 50 MG tablet Take 1 tablet (50 mg total) by mouth 3 (three) times daily. 270 tablet 0  . hydrochlorothiazide (MICROZIDE) 12.5 MG capsule Take 1 capsule (12.5 mg total) by mouth daily. 30 capsule 3  . metoprolol tartrate (  LOPRESSOR) 25 MG tablet Take 1 tablet (25 mg total) by mouth 2 (two) times daily. 180 tablet 0  . nitroGLYCERIN (NITROSTAT) 0.4 MG SL tablet Place 1 tablet (0.4 mg total) under the tongue every 5 (five) minutes as needed for chest pain. 90 tablet 3  . spironolactone (ALDACTONE) 25 MG tablet Take 1 tablet (25 mg total) by mouth daily. 90 tablet 0   No facility-administered medications prior to visit.    Allergies  Allergen Reactions  . Ibuprofen     Can not take with current medications  . Tylenol [Acetaminophen] Other (See Comments)    Pt stated tylenol gives her extreme headache       Objective:    BP 112/67 (BP Location: Right Arm, Patient Position: Sitting, Cuff Size: Large)   Pulse 65   Temp 97.9 F (36.6 C) (Temporal)   Wt 202 lb (91.6 kg)   SpO2 100%   BMI 32.60 kg/m  Wt Readings from Last 3 Encounters:  10/26/19 202 lb (91.6 kg)  07/18/19 199 lb (90.3 kg)  06/22/19 198 lb 8 oz (90 kg)    Physical Exam Vitals and nursing note reviewed.  Constitutional:      Appearance: She is well-developed.  HENT:     Head: Normocephalic and atraumatic.  Cardiovascular:     Rate and Rhythm: Normal rate and regular rhythm.     Heart sounds: Normal  heart sounds. No murmur. No friction rub. No gallop.   Pulmonary:     Effort: Pulmonary effort is normal. No tachypnea or respiratory distress.     Breath sounds: Normal breath sounds. No decreased breath sounds, wheezing, rhonchi or rales.  Chest:     Chest wall: No tenderness.  Abdominal:     General: Bowel sounds are normal.     Palpations: Abdomen is soft.  Musculoskeletal:        General: Normal range of motion.     Cervical back: Normal range of motion.  Skin:    General: Skin is warm and dry.  Neurological:     Mental Status: She is alert and oriented to person, place, and time.     Coordination: Coordination normal.  Psychiatric:        Behavior: Behavior normal. Behavior is cooperative.        Thought Content: Thought content normal.        Judgment: Judgment normal.          Patient has been counseled extensively about nutrition and exercise as well as the importance of adherence with medications and regular follow-up. The patient was given clear instructions to go to ER or return to medical center if symptoms don't improve, worsen or new problems develop. The patient verbalized understanding.   Follow-up: Return in about 3 months (around 01/26/2020).   [name], FNP-BC [facility] and [place], [place]   10/27/2019, 8:05 PM

## 2019-10-27 ENCOUNTER — Encounter: Payer: Self-pay | Admitting: Nurse Practitioner

## 2019-11-01 NOTE — Progress Notes (Signed)
Patient not seen. She did not respond to text with link for video visit.  I then called patient.  She did not pick up phone and I had to leave a message.

## 2019-11-02 ENCOUNTER — Other Ambulatory Visit: Payer: Self-pay

## 2019-11-02 ENCOUNTER — Telehealth (INDEPENDENT_AMBULATORY_CARE_PROVIDER_SITE_OTHER): Payer: Medicare Other | Admitting: Neurology

## 2019-11-02 ENCOUNTER — Telehealth: Payer: Self-pay | Admitting: Neurology

## 2019-11-02 DIAGNOSIS — G43009 Migraine without aura, not intractable, without status migrainosus: Secondary | ICD-10-CM

## 2019-11-02 NOTE — Telephone Encounter (Signed)
I can't really comment as this is a new issue.  That would have to be discussed in the office.  Unfortunately, she did not log on to her virtual office or respond to the phone at her office time.  In the meantime, it can further be evaluated by her PCP since she is aware and has seen the patient

## 2019-11-02 NOTE — Telephone Encounter (Signed)
Telephone call back to pt, Pt seen by her PCP last week. Pt was advised by PCP to discuss her falling with Dr. Tomi Likens. Pt states she fallen twice since she spoke to her PCP.  Pt appt 03/2020

## 2019-11-02 NOTE — Telephone Encounter (Signed)
LMOVM

## 2019-11-02 NOTE — Telephone Encounter (Signed)
Patient was late for her appointment, but called to reschedule. She wanted to speak with someone about symptoms.

## 2019-11-02 NOTE — Telephone Encounter (Signed)
Pt returned our call. Pt advised of Dr. Tomi Likens note.

## 2019-11-05 DIAGNOSIS — I639 Cerebral infarction, unspecified: Secondary | ICD-10-CM | POA: Diagnosis not present

## 2019-11-08 DIAGNOSIS — H2511 Age-related nuclear cataract, right eye: Secondary | ICD-10-CM | POA: Diagnosis not present

## 2019-11-08 DIAGNOSIS — H538 Other visual disturbances: Secondary | ICD-10-CM | POA: Diagnosis not present

## 2019-11-08 DIAGNOSIS — I639 Cerebral infarction, unspecified: Secondary | ICD-10-CM | POA: Diagnosis not present

## 2019-12-05 ENCOUNTER — Emergency Department (HOSPITAL_BASED_OUTPATIENT_CLINIC_OR_DEPARTMENT_OTHER)
Admission: EM | Admit: 2019-12-05 | Discharge: 2019-12-05 | Disposition: A | Discharge status: Home / Self Care | Payer: Medicare Other | Attending: Emergency Medicine | Admitting: Emergency Medicine

## 2019-12-05 ENCOUNTER — Encounter (HOSPITAL_BASED_OUTPATIENT_CLINIC_OR_DEPARTMENT_OTHER): Payer: Self-pay

## 2019-12-05 ENCOUNTER — Other Ambulatory Visit: Payer: Self-pay

## 2019-12-05 DIAGNOSIS — Z87891 Personal history of nicotine dependence: Secondary | ICD-10-CM | POA: Diagnosis not present

## 2019-12-05 DIAGNOSIS — G4489 Other headache syndrome: Secondary | ICD-10-CM | POA: Diagnosis not present

## 2019-12-05 DIAGNOSIS — R519 Headache, unspecified: Secondary | ICD-10-CM | POA: Diagnosis not present

## 2019-12-05 DIAGNOSIS — G8929 Other chronic pain: Secondary | ICD-10-CM

## 2019-12-05 DIAGNOSIS — I10 Essential (primary) hypertension: Secondary | ICD-10-CM | POA: Diagnosis not present

## 2019-12-05 DIAGNOSIS — I499 Cardiac arrhythmia, unspecified: Secondary | ICD-10-CM | POA: Diagnosis not present

## 2019-12-05 DIAGNOSIS — I639 Cerebral infarction, unspecified: Secondary | ICD-10-CM | POA: Diagnosis not present

## 2019-12-05 DIAGNOSIS — R52 Pain, unspecified: Secondary | ICD-10-CM | POA: Diagnosis not present

## 2019-12-05 DIAGNOSIS — Z743 Need for continuous supervision: Secondary | ICD-10-CM | POA: Diagnosis not present

## 2019-12-05 DIAGNOSIS — R6889 Other general symptoms and signs: Secondary | ICD-10-CM | POA: Diagnosis not present

## 2019-12-05 NOTE — ED Notes (Signed)
ED Provider at bedside. 

## 2019-12-05 NOTE — ED Triage Notes (Signed)
Pt c/o HA started 7/3-from EMS stretcher to w/c-NAD-to triage in w/c

## 2019-12-05 NOTE — ED Provider Notes (Signed)
[facility] Emergency Department Provider Note MRN:  [id number]  Arrival date & time: 12/05/19     Chief Complaint   Headache History of Present Illness   Brandy Roberts is a 49 y.o. year-old female with a history of stroke presenting to the ED with chief complaint of headache.  Patient has had years of headache, has noticed some increase in frequency and severity over the past several months.  Takes an anti-inflammatory at home 3 times a day, but was out of town and did not take them for the past few days.  Her symptoms are now resolved after being in the waiting room for a few hours.  She denies any recent fever or vomiting, no new numbness or weakness to the arms or legs.  She has decreased sensation strength to the left side which is unchanged.  Currently without symptoms or pain.  Review of Systems  A complete 10 system review of systems was obtained and all systems are negative except as noted in the HPI and PMH.   Patient's Health History    Past Medical History:  Diagnosis Date  . Anemia   . Anxiety   . Chronic ischemic vertebrobasilar artery thalamic stroke   . Depression   . Hyperlipidemia   . Hypertension   . Migraines   . Nonrheumatic aortic (valve) insufficiency   . Osteoarthritis   . Sleep apnea   . Stroke [facility]) 10/25/2016    Past Surgical History:  Procedure Laterality Date  . CESAREAN SECTION    . GANGLION CYST EXCISION    . TEE WITHOUT CARDIOVERSION N/A 10/09/2014   Procedure: TRANSESOPHAGEAL ECHOCARDIOGRAM (TEE);  Surgeon: Sanda Klein, MD;  Location: [facility] ENDOSCOPY;  Service: Cardiovascular;  Laterality: N/A;  . TONSILLECTOMY      Family History  Problem Relation Age of Onset  . Cancer Mother        type unknown  . Hypertension Mother   . Hypertension Sister   . Diabetes Sister   . Cancer Maternal Aunt        4 aunts died of cancer types unknown    Social History   Socioeconomic History  . Marital status: Single      Spouse name: Not on file  . Number of children: 2  . Years of education: 82  . Highest education level: High school graduate  Occupational History  . Occupation: disable   Tobacco Use  . Smoking status: Former Smoker    Packs/day: 0.25    Quit date: 08/31/2017    Years since quitting: 2.2  . Smokeless tobacco: Never Used  Vaping Use  . Vaping Use: Never used  Substance and Sexual Activity  . Alcohol use: No    Alcohol/week: 0.0 standard drinks  . Drug use: Yes    Types: Marijuana  . Sexual activity: Not on file  Other Topics Concern  . Not on file  Social History Narrative   Patient lives with her uncle in a one story home.  Has 2 sons.  Currently on disability.  Education: high school.   Drinks 1-2 sodas a week       Social Determinants of Radio broadcast assistant Strain:   . Difficulty of Paying Living Expenses:   Food Insecurity:   . Worried About Charity fundraiser in the Last Year:   . Arboriculturist in the Last Year:   Transportation Needs:   . Film/video editor (Medical):   [name] Lack  of Transportation (Non-Medical):   Physical Activity:   . Days of Exercise per Week:   . Minutes of Exercise per Session:   Stress:   . Feeling of Stress :   Social Connections:   . Frequency of Communication with Friends and Family:   . Frequency of Social Gatherings with Friends and Family:   . Attends Religious Services:   . Active Member of Clubs or Organizations:   . Attends Archivist Meetings:   [name] Marital Status:   Intimate Partner Violence:   . Fear of Current or Ex-Partner:   . Emotionally Abused:   [name] Physically Abused:   . Sexually Abused:      Physical Exam   Vitals:   12/05/19 2036 12/05/19 2038  BP: (!) 167/86   Pulse: 85   Resp: 16   Temp: 98.7 F (37.1 C)   SpO2: 98% 98%    CONSTITUTIONAL: Well-appearing, NAD NEURO:  Alert and oriented x 3, left-sided decreased strength and sensation EYES:  eyes equal and reactive ENT/NECK:  no  LAD, no JVD CARDIO: Regular rate, well-perfused, normal S1 and S2 PULM:  CTAB no wheezing or rhonchi GI/GU:  normal bowel sounds, non-distended, non-tender MSK/SPINE:  No gross deformities, no edema SKIN:  no rash, atraumatic PSYCH:  Appropriate speech and behavior  *Additional and/or pertinent findings included in MDM below  Diagnostic and Interventional Summary    EKG Interpretation  Date/Time:    Ventricular Rate:    PR Interval:    QRS Duration:   QT Interval:    QTC Calculation:   R Axis:     Text Interpretation:        Labs Reviewed - No data to display  No orders to display    Medications - No data to display   Procedures  /  Critical Care Procedures  ED Course and Medical Decision Making  I have reviewed the triage vital signs, the nursing notes, and pertinent available records from the EMR.  Listed above are laboratory and imaging tests that I personally ordered, reviewed, and interpreted and then considered in my medical decision making (see below for details).      Normal vital signs, baseline neurological exam, chronic headaches not currently present, seems to be clearly explained by being without her anti-inflammatories for the past 1 or 2 days.  I see no indication for imaging or laboratory assessment, she would like to go home and follow-up with her regular doctors.    Barth Kirks. Sedonia Small, [place] [email or web address]  Final Clinical Impressions(s) / ED Diagnoses     ICD-10-CM   1. Chronic nonintractable headache, unspecified headache type  R51.9    G89.29     ED Discharge Orders    None       Discharge Instructions Discussed with and Provided to Patient:     Discharge Instructions     You were evaluated in the Emergency Department and after careful evaluation, we did not find any emergent condition requiring admission or further testing in the hospital.  Your exam/testing today was  overall reassuring.  We recommend taking your home medications and following up with your regular doctors.  Please return to the Emergency Department if you experience any worsening of your condition. Thank you for allowing [place] to be a part of your care.       Maudie Flakes, MD 12/05/19 [phone]

## 2019-12-05 NOTE — Discharge Instructions (Addendum)
You were evaluated in the Emergency Department and after careful evaluation, we did not find any emergent condition requiring admission or further testing in the hospital.  Your exam/testing today was overall reassuring.  We recommend taking your home medications and following up with your regular doctors.  Please return to the Emergency Department if you experience any worsening of your condition. Thank you for allowing [place] to be a part of your care.

## 2019-12-06 ENCOUNTER — Other Ambulatory Visit: Payer: Self-pay | Admitting: Neurology

## 2019-12-06 ENCOUNTER — Other Ambulatory Visit: Payer: Self-pay | Admitting: Family Medicine

## 2019-12-30 ENCOUNTER — Ambulatory Visit: Payer: Medicare Other | Admitting: Cardiology

## 2020-01-02 ENCOUNTER — Other Ambulatory Visit: Payer: Self-pay | Admitting: Nurse Practitioner

## 2020-01-02 ENCOUNTER — Other Ambulatory Visit: Payer: Self-pay | Admitting: Neurology

## 2020-01-02 DIAGNOSIS — Z8673 Personal history of transient ischemic attack (TIA), and cerebral infarction without residual deficits: Secondary | ICD-10-CM

## 2020-01-04 DIAGNOSIS — I639 Cerebral infarction, unspecified: Secondary | ICD-10-CM | POA: Diagnosis not present

## 2020-01-10 ENCOUNTER — Ambulatory Visit: Payer: Self-pay

## 2020-01-10 ENCOUNTER — Telehealth: Payer: Self-pay | Admitting: Nurse Practitioner

## 2020-01-10 NOTE — Telephone Encounter (Signed)
Call returned to the patient. She explained that she needs an FL2 sent to [facility] who is assisting her with housing.   Informed her that this CM will need to speak to Kennyth Lose to confirm what is needed.   Call placed to [facility] # [phone].  She confirmed that she is working with patient and the [employer].  She will call this CM back with fax # for FL2. She stated that the recommended level of care should indicate domiciliary level of care.

## 2020-01-10 NOTE — Telephone Encounter (Signed)
Pt. Reports she started having pain in her left leg and arm this morning. Reports this is the same side affected by her stroke in 2016. No changes in sensation or movement. Reports she did give herself her Emgality injection in left leg yesterday. No other symptoms. No availability in the practice today or tomorrow. Pt. Asking if she can be worked in. Instructed to go to ED for worsening of symptoms. Also needs a FL2 form filled out - will have this faxed to the practice. Please advise pt.   Reason for Disposition . [1] SEVERE pain (e.g., excruciating, unable to do any normal activities) AND [2] not improved after 2 hours of pain medicine  Answer Assessment - Initial Assessment Questions 1. ONSET: "When did the pain start?"      Today 2. LOCATION: "Where is the pain located?"      Left leg and arm 3. PAIN: "How bad is the pain?"    (Scale 1-10; or mild, moderate, severe)   -  MILD (1-3): doesn't interfere with normal activities    -  MODERATE (4-7): interferes with normal activities (e.g., work or school) or awakens from sleep, limping    -  SEVERE (8-10): excruciating pain, unable to do any normal activities, unable to walk     8 4. WORK OR EXERCISE: "Has there been any recent work or exercise that involved this part of the body?"      No 5. CAUSE: "What do you think is causing the leg pain?"     Unsure 6. OTHER SYMPTOMS: "Do you have any other symptoms?" (e.g., chest pain, back pain, breathing difficulty, swelling, rash, fever, numbness, weakness)     Numbness since 2016 7. PREGNANCY: "Is there any chance you are pregnant?" "When was your last menstrual period?"     No  Protocols used: LEG PAIN-A-AH

## 2020-01-10 NOTE — Telephone Encounter (Signed)
Copied from [place] [phone]. Topic: Appointment Scheduling - Scheduling Inquiry for Clinic >> Jan 10, 2020 11:16 AM Scherrie Gerlach wrote: Reason for CRM: pt states she has a FL2 that needs to be filled out asap.  Would like appt asap.

## 2020-01-11 NOTE — Telephone Encounter (Signed)
Copied from [place] [phone]. Topic: General - Other >> Jan 11, 2020  8:45 AM Rainey Pines A wrote: Patient wants a callback from nurse in regards to her plavix medication request denial. Patient stated she was under the impression this medication was going to be an ongoing medication and not discontinued. Please advise

## 2020-01-12 ENCOUNTER — Other Ambulatory Visit: Payer: Self-pay | Admitting: Nurse Practitioner

## 2020-01-12 ENCOUNTER — Other Ambulatory Visit: Payer: Self-pay

## 2020-01-12 ENCOUNTER — Telehealth: Payer: Self-pay | Admitting: Nurse Practitioner

## 2020-01-12 ENCOUNTER — Telehealth: Payer: Medicare Other | Admitting: Neurology

## 2020-01-12 MED ORDER — CLOPIDOGREL BISULFATE 75 MG PO TABS
75.0000 mg | ORAL_TABLET | Freq: Every day | ORAL | 3 refills | Status: DC
Start: 2020-01-12 — End: 2020-12-10

## 2020-01-12 NOTE — Progress Notes (Unsigned)
Virtual Visit via Video Note The purpose of this virtual visit is to provide medical care while limiting exposure to the novel coronavirus.    Consent was obtained for video visit:  Yes.   Answered questions that patient had about telehealth interaction:  Yes.   I discussed the limitations, risks, security and privacy concerns of performing an evaluation and management service by telemedicine. I also discussed with the patient that there may be a patient responsible charge related to this service. The patient expressed understanding and agreed to proceed.  Pt location: Home Physician Location: office Name of referring provider:  [name], NP I connected with [name] at patients initiation/request on 01/12/2020 at 10:50 AM EDT by video enabled telemedicine application and verified that I am speaking with the correct person using two identifiers. Pt MRN:  [id number] Pt DOB:  18-Apr-1971 Video Participants:  [name]   History of Present Illness:  Brandy Roberts is a 49 year old right-handed woman with aortic insufficiency, hypertension, hyperlipidemia and history of stroke who follows up for migraine, syncope and now unsteadiness on feet with falls.   UPDATE: Intensity: Moderate Duration: 2 to 3 hours Frequency: 2 days a month Current NSAIDS: Flurbiprofen 100mg  Current analgesics: None Current triptans: None Current ergotamine: None Current anti-emetic: Zofran 4 mg, Reglan 10 mg Current muscle relaxants: None Current anti-anxiolytic: None Current sleep aide: trazodone Current Antihypertensive medications: Amlodipine 10 mg, hydralazine, Spironolactone, lisinopril, Lopressor Current Antidepressant medications: Venlafaxine XR 225 mg Current Anticonvulsant medications: gabapentin 300 mg twice daily Current anti-CGRP: Emgality Current Vitamins/Herbal/Supplements: None Current Antihistamines/Decongestants: None Other therapy: None Hormone/birth control:  None   Caffeine: Coffee, sweet tea, not daily Alcohol: No Smoker: Cigarettes Diet: Hydrates Exercise: No Depression: Yes; Anxiety: Yes Other pain: Chronic right knee pain Sleep hygiene: Sometimes tosses and turns   She now reports unsteadiness on her feet.  ***.  She carries a diagnosis of prediabetes.  Hgb A1c from February 2020 was 5.8.  Serum glucose from May was 97.  Thyroid panel from March was normal.   HISTORY: MIGRAINES Onset: Migraines since she was young, but worse since her stroke in May 2016. Location:  holocephalic Quality:  pounding Initial Intensity:  10/10 Aura:  no Prodrome:  no Postdrome:  no Associated symptoms: Photophobia, phonophobia.  Rarely nausea.  She has not had any new worse headache of her life, waking up from sleep Initial Duration:  2 days (but Fioricet and ibuprofen lowers intensity down to 2-3/10) Initial Frequency:  Once or twice a month Frequency of abortive medication: only as needed Triggers: None Relieving factors:  Laying down.  Butalbital, ibuprofen.  Tramadol helped but she was told not to take it. Activity:  aggravates   Past NSAIDS:  Ibuprofen, naproxen Past analgesics:  Tylenol, Excedrin, Tramadol (effective but GI upset), Fioricet Past abortive triptans:  no Past muscle relaxants:  no Past anti-emetic:  no Past antihypertensive medications:  no Past antidepressant medications:  no Past anticonvulsant medications: topiramate Past vitamins/Herbal/Supplements:  no Other past therapies:  no   Family history of headache:  Sister.  She had a stroke in May 2016 with left sided weakness.  MRI of brain from 10/06/14 was personally reviewed and revealed small acute lacunar infarcts in the right thalamus and right corona radiata, as well as chronic lacunar infarcts in the left hemisphere.  CTA of head and neck revealed diffuse bilateral petrous and cavernous carotid stenosis.  Cardiac source of embolus was not discovered.     She  was  admitted to [facility] from 10/24/16 to 10/26/16 for stroke-like event.  She suddenly developed left sided weakness and slurred speech with associated headache.  Blood pressure in ED was 120/66.  CT of head was personally reviewed and negative for acute abnormality.  She had refused tPA.  MRI of brain was personally reviewed and revealed no acute stroke or bleed.  MRA of head revealed no significant intracranial stenosis or occlusion.  LDL was 84.  Hgb A1c was 5.7.  As per neurology evaluation, her deficits appeared to be non-organic, with unusual speech pattern and giveaway weakness.  Somatization was suspected.  2D echo from 11/06/16 demonstrated normal LV EF of 65-70% with no cardiac source of emboli.  Atorvastatin was increased from 10mg  to 80mg  daily.  Differential includes TIA vs hemiplegic migraine vs somatization.  In May 2019, she endorsed increased problems with balance and falls.  She stated that they were associated with headache and worsening of her left-sided weakness.  MRI of the brain without contrast from 11/13/2017 demonstrated no new intracranial abnormalities.  On 09/21/18, she presented to the ED at [facility] for a 5 day intractable holocephalic throbbing headache.  She reports that when EMS was leading her to the ambulance from her bedroom, she passed out for 20-30 seconds.  She described sensation of lightheadedness, like she was going to pass out, as well as tunnel vision and palpitations.  She was afebrile.  CT of head revealed her old left basal ganglia infarct but no acute abnormality.  She was treated with Reglan and Benadryl and headache reportedly resolved.  On 09/26/18, she was sitting on her barstool when she felt lightheaded again.  She noted palpitations as well.  She got up and started walking to her bedroom when she noted tunnel vision and passed out.  She woke up on the floor and bruised her back and leg.  She thinks she may have been unconsciousness for 20 to 30 minute.   There were no witnesses.  She noted urinary incontinence but did not bite her tongue.  She did not have a headache.  She denies change in medications over the past week.  She denies fever or illness.  Due to episodes of recurrent syncopal events, she underwent workup.  MRI of brain with and without contrast on 10/11/18 was personally reviewed and demonstrated stable chronic infarcts but no acute intracranial abnormality.  EEG performed on 10/21/18 was normal. No recurrent spells.  Past Medical History: Past Medical History:  Diagnosis Date  . Anemia   . Anxiety   . Chronic ischemic vertebrobasilar artery thalamic stroke   . Depression   . Hyperlipidemia   . Hypertension   . Migraines   . Nonrheumatic aortic (valve) insufficiency   . Osteoarthritis   . Sleep apnea   . Stroke ([place]) 10/25/2016    Medications: Outpatient Encounter Medications as of 01/12/2020  Medication Sig  . amLODipine (NORVASC) 10 MG tablet Take 1 tablet (10 mg total) by mouth daily.  [name] atorvastatin (LIPITOR) 80 MG tablet TAKE 1 TABLET BY MOUTH EVERY DAY AT 6PM  . Elastic Bandages & Supports (WRIST/THUMB SPLINT/RIGHT MED) MISC 1 each by Does not apply route daily.  [name] EMGALITY 120 MG/ML SOAJ INJECT 1 ML (120 MG) SUBCUTANEOUSLY EVERY 30 DAYS  . flurbiprofen (ANSAID) 100 MG tablet TAKE 1 TABLET BY MOUTH EVERY 8 HOURS AS NEEDED (MAX 3 TABS IN 24 HOURS)  . gabapentin (NEURONTIN) 300 MG capsule Take 1 capsule (300 mg total)  by mouth 2 (two) times daily.  . hydrALAZINE (APRESOLINE) 50 MG tablet Take 1 tablet (50 mg total) by mouth 3 (three) times daily.  . hydrochlorothiazide (MICROZIDE) 12.5 MG capsule Take 1 capsule (12.5 mg total) by mouth daily.  [name] lisinopril (ZESTRIL) 40 MG tablet Take 1 tablet (40 mg total) by mouth daily.  . metoCLOPramide (REGLAN) 10 MG tablet Take 1 tablet (10 mg total) by mouth every 6 (six) hours as needed for nausea (and headache).  . metoprolol tartrate (LOPRESSOR) 25 MG tablet Take 1 tablet (25 mg  total) by mouth 2 (two) times daily.  . Misc. Devices MISC Please provide patient with insurance approved blood pressure monitor.  . Misc. Devices MISC Please provide Left foot AFO M21.372 G81.94  . mupirocin ointment (BACTROBAN) 2 % Apply 1 application topically 2 (two) times daily.  . nitroGLYCERIN (NITROSTAT) 0.4 MG SL tablet Place 1 tablet (0.4 mg total) under the tongue every 5 (five) minutes as needed for chest pain.  . pantoprazole (PROTONIX) 20 MG tablet Take 1 tablet (20 mg total) by mouth daily.  [name] spironolactone (ALDACTONE) 25 MG tablet Take 1 tablet (25 mg total) by mouth daily.  . traZODone (DESYREL) 100 MG tablet Take 1 tablet (100 mg total) by mouth at bedtime.  [name] venlafaxine XR (EFFEXOR-XR) 75 MG 24 hr capsule TAKE 3 CAPSULES (225 MG) BY MOUTH DAILY   No facility-administered encounter medications on file as of 01/12/2020.    Allergies: Allergies  Allergen Reactions  . Ibuprofen     Can not take with current medications  . Tylenol [Acetaminophen] Other (See Comments)    Pt stated tylenol gives her extreme headache    Family History: Family History  Problem Relation Age of Onset  . Cancer Mother        type unknown  . Hypertension Mother   . Hypertension Sister   . Diabetes Sister   . Cancer Maternal Aunt        4 aunts died of cancer types unknown    Social History: Social History   Socioeconomic History  . Marital status: Single    Spouse name: Not on file  . Number of children: 2  . Years of education: 34  . Highest education level: High school graduate  Occupational History  . Occupation: disable   Tobacco Use  . Smoking status: Former Smoker    Packs/day: 0.25    Quit date: 08/31/2017    Years since quitting: 2.3  . Smokeless tobacco: Never Used  Vaping Use  . Vaping Use: Never used  Substance and Sexual Activity  . Alcohol use: No    Alcohol/week: 0.0 standard drinks  . Drug use: Yes    Types: Marijuana  . Sexual activity: Not on file    Other Topics Concern  . Not on file  Social History Narrative   Patient lives with her uncle in a one story home.  Has 2 sons.  Currently on disability.  Education: high school.   Drinks 1-2 sodas a week       Social Determinants of Radio broadcast assistant Strain:   . Difficulty of Paying Living Expenses:   Food Insecurity:   . Worried About Charity fundraiser in the Last Year:   . Arboriculturist in the Last Year:   Transportation Needs:   . Film/video editor (Medical):   [name] Lack of Transportation (Non-Medical):   Physical Activity:   . Days of Exercise per  Week:   . Minutes of Exercise per Session:   Stress:   . Feeling of Stress :   Social Connections:   . Frequency of Communication with Friends and Family:   . Frequency of Social Gatherings with Friends and Family:   . Attends Religious Services:   . Active Member of Clubs or Organizations:   . Attends Archivist Meetings:   [name] Marital Status:   Intimate Partner Violence:   . Fear of Current or Ex-Partner:   . Emotionally Abused:   [name] Physically Abused:   . Sexually Abused:     Observations/Objective:   *** No acute distress.  Alert and oriented.  Speech fluent and not dysarthric.  Language intact.  Eyes orthophoric on primary gaze.  Face symmetric.  Assessment and Plan:   1.  Migraine without aura, without status migrainosus, not intraxctable 2.  Recurrent loss of consciousness.  Semiology characteristic of syncope.  May be due to orthostatic hypotension, vasovagal or cardiac source.  The first event occurred in setting of severe migraine, which may have been vasovagal.  MRI of brain and EEG unremarkable. 3.  Left-sided hemiplegia as late effect of stroke 4.  Right hand/thumb pain and numbness.    Possibly carpal tunnel syndrome.  It also may be a tendonitis.  1.  For migraine prevention:  Emgality 2.  For migraine rescue:  Flurbiprofen 100mg  3. Limit use of pain relievers to no more than 2 days  out of week to prevent risk of rebound or medication-overuse headache. 4. Secondary stroke prevention as managed by PCP:  Plavix, statin, blood pressure control 5. ***   Follow Up Instructions:    -I discussed the assessment and treatment plan with the patient. The patient was provided an opportunity to ask questions and all were answered. The patient agreed with the plan and demonstrated an understanding of the instructions.   The patient was advised to call back or seek an in-person evaluation if the symptoms worsen or if the condition fails to improve as anticipated.    Total Time spent in visit with the patient was:  ***, of which more than 50% of the time was spent in counseling and/or coordinating care on ***.   Pt understands and agrees with the plan of care outlined.     Dudley Major, DO

## 2020-01-12 NOTE — Telephone Encounter (Signed)
Please advise.   Copied from [place] [id number]. Topic: General - Inquiry >> Jan 12, 2020  4:44 PM Alease Frame wrote: Reason for CRM: Pt is calling for Fl2 paperwork to filled out and ready for pickuop before Monday . Please reach out to pt.

## 2020-01-12 NOTE — Telephone Encounter (Signed)
There was never a script request sent. Last prescription was from January to April. Will send to pharmacy.

## 2020-01-12 NOTE — Telephone Encounter (Signed)
Agree with recommendations to be evaluated in the ED

## 2020-01-12 NOTE — Telephone Encounter (Signed)
CMA faxed the [place] form.

## 2020-01-12 NOTE — Telephone Encounter (Signed)
Will route to PCP 

## 2020-01-13 NOTE — Telephone Encounter (Signed)
Pt. Was informed

## 2020-01-13 NOTE — Telephone Encounter (Signed)
Informed patient medication has been sent to [place]. Pt. Is aware.

## 2020-01-16 ENCOUNTER — Telehealth: Payer: Self-pay | Admitting: Neurology

## 2020-01-16 MED ORDER — PREDNISONE 10 MG PO TABS
ORAL_TABLET | ORAL | 0 refills | Status: DC
Start: 2020-01-16 — End: 2020-02-20

## 2020-01-16 NOTE — Telephone Encounter (Signed)
With a driver, she can come in for a headache cocktail.  Otherwise, we can prescribe her a prednisone taper (60mg  on day 1, then 50mg  on day 2, then 40mg  on day 3, then 30mg  on day 4, then 20mg  on day 5, then 10mg  on day 6, then STOP)

## 2020-01-16 NOTE — Telephone Encounter (Signed)
Patient states she's having a lot of headaches. She wants to see Dr Tomi Likens this week. Let her know that soonest available was in Nov and added appt to waitlist. Please call

## 2020-01-16 NOTE — Telephone Encounter (Signed)
Spoke with pt who would like to try prednisone taper for her migraine, script sent to [place], she verbalized understanding of inst incl with script.

## 2020-01-16 NOTE — Telephone Encounter (Signed)
Spoke with pt who says she is taking her Emgality and has had a migraine x1 week, has taken flurbiprofen BID since headache started. She has a f/u appt scheduled for 04/2020 but "I need to be seen sooner". Told her that she is on the wait list. She says if Dr Tomi Likens wants her to come in for a headache cocktail she can do that but probably needs to be later in the week so she can arrange transportation. Told pt I'd give message to Dr Tomi Likens and update her with his feedback, she verbalized understanding.

## 2020-01-25 ENCOUNTER — Ambulatory Visit: Payer: Medicare Other | Admitting: Cardiology

## 2020-01-30 ENCOUNTER — Ambulatory Visit: Payer: Medicare Other | Admitting: Nurse Practitioner

## 2020-02-03 DIAGNOSIS — I639 Cerebral infarction, unspecified: Secondary | ICD-10-CM | POA: Diagnosis not present

## 2020-02-14 ENCOUNTER — Other Ambulatory Visit: Payer: Self-pay | Admitting: Nurse Practitioner

## 2020-02-20 ENCOUNTER — Ambulatory Visit: Payer: Medicare Other | Attending: Nurse Practitioner | Admitting: Nurse Practitioner

## 2020-02-20 ENCOUNTER — Other Ambulatory Visit: Payer: Self-pay

## 2020-02-20 VITALS — BP 109/67 | HR 57 | Temp 97.7°F | Ht 66.0 in | Wt 198.0 lb

## 2020-02-20 DIAGNOSIS — Z1159 Encounter for screening for other viral diseases: Secondary | ICD-10-CM

## 2020-02-20 DIAGNOSIS — I119 Hypertensive heart disease without heart failure: Secondary | ICD-10-CM

## 2020-02-20 DIAGNOSIS — E785 Hyperlipidemia, unspecified: Secondary | ICD-10-CM

## 2020-02-20 DIAGNOSIS — G629 Polyneuropathy, unspecified: Secondary | ICD-10-CM

## 2020-02-20 DIAGNOSIS — D72829 Elevated white blood cell count, unspecified: Secondary | ICD-10-CM | POA: Diagnosis not present

## 2020-02-20 DIAGNOSIS — I43 Cardiomyopathy in diseases classified elsewhere: Secondary | ICD-10-CM

## 2020-02-20 DIAGNOSIS — R7989 Other specified abnormal findings of blood chemistry: Secondary | ICD-10-CM

## 2020-02-20 DIAGNOSIS — I10 Essential (primary) hypertension: Secondary | ICD-10-CM

## 2020-02-20 DIAGNOSIS — R7303 Prediabetes: Secondary | ICD-10-CM | POA: Diagnosis not present

## 2020-02-20 DIAGNOSIS — K2100 Gastro-esophageal reflux disease with esophagitis, without bleeding: Secondary | ICD-10-CM

## 2020-02-20 LAB — GLUCOSE, POCT (MANUAL RESULT ENTRY): POC Glucose: 142 mg/dl — AB (ref 70–99)

## 2020-02-20 LAB — POCT GLYCOSYLATED HEMOGLOBIN (HGB A1C): Hemoglobin A1C: 5.9 % — AB (ref 4.0–5.6)

## 2020-02-20 MED ORDER — ATORVASTATIN CALCIUM 80 MG PO TABS: ORAL_TABLET | ORAL | 1 refills | Status: DC

## 2020-02-20 MED ORDER — SPIRONOLACTONE 25 MG PO TABS: 25.0000 mg | ORAL_TABLET | Freq: Every day | ORAL | 1 refills | Status: DC

## 2020-02-20 MED ORDER — GABAPENTIN 300 MG PO CAPS: 300.0000 mg | ORAL_CAPSULE | Freq: Two times a day (BID) | ORAL | 1 refills | Status: DC

## 2020-02-20 MED ORDER — HYDRALAZINE HCL 50 MG PO TABS: 50.0000 mg | ORAL_TABLET | Freq: Three times a day (TID) | ORAL | 1 refills | Status: DC

## 2020-02-20 MED ORDER — METOPROLOL TARTRATE 25 MG PO TABS: 25.0000 mg | ORAL_TABLET | Freq: Two times a day (BID) | ORAL | 1 refills | Status: DC

## 2020-02-20 MED ORDER — PANTOPRAZOLE SODIUM 20 MG PO TBEC: 20.0000 mg | DELAYED_RELEASE_TABLET | Freq: Every day | ORAL | 1 refills | Status: DC

## 2020-02-20 MED ORDER — NITROGLYCERIN 0.4 MG SL SUBL: 0.4000 mg | SUBLINGUAL_TABLET | SUBLINGUAL | 3 refills | Status: DC | PRN

## 2020-02-20 MED ORDER — METOCLOPRAMIDE HCL 10 MG PO TABS: 10.0000 mg | ORAL_TABLET | Freq: Four times a day (QID) | ORAL | 2 refills | Status: DC | PRN

## 2020-02-20 MED ORDER — HYDROCHLOROTHIAZIDE 12.5 MG PO CAPS: 12.5000 mg | ORAL_CAPSULE | Freq: Every day | ORAL | 1 refills | Status: DC

## 2020-02-20 MED ORDER — AMLODIPINE BESYLATE 10 MG PO TABS: 10.0000 mg | ORAL_TABLET | Freq: Every day | ORAL | 1 refills | Status: DC

## 2020-02-20 NOTE — Progress Notes (Signed)
Assessment & Plan:  Brandy Roberts was seen today for follow-up.  Diagnoses and all orders for this visit:  Essential hypertension -     CMP14+EGFR -     spironolactone (ALDACTONE) 25 MG tablet; Take 1 tablet (25 mg total) by mouth daily. -     metoprolol tartrate (LOPRESSOR) 25 MG tablet; Take 1 tablet (25 mg total) by mouth 2 (two) times daily. -     hydrochlorothiazide (MICROZIDE) 12.5 MG capsule; Take 1 capsule (12.5 mg total) by mouth daily. -     hydrALAZINE (APRESOLINE) 50 MG tablet; Take 1 tablet (50 mg total) by mouth 3 (three) times daily. -     amLODipine (NORVASC) 10 MG tablet; Take 1 tablet (10 mg total) by mouth daily. Continue all antihypertensives as prescribed.  Remember to bring in your blood pressure log with you for your follow up appointment.  DASH/Mediterranean Diets are healthier choices for HTN.    Prediabetes -     HgB A1c -     Glucose (CBG) Continue blood sugar control as discussed in office today, low carbohydrate diet, and regular physical exercise as tolerated, 150 minutes per week (30 min each day, 5 days per week, or 50 min 3 days per week  Neuropathy -     gabapentin (NEURONTIN) 300 MG capsule; Take 1 capsule (300 mg total) by mouth 2 (two) times daily.  Need for hepatitis C screening test -     Hepatitis C Antibody  Dyslipidemia -     atorvastatin (LIPITOR) 80 MG tablet; TAKE 1 TABLET BY MOUTH EVERY DAY AT 6PM INSTRUCTIONS: Work on a low fat, heart healthy diet and participate in regular aerobic exercise program by working out at least 150 minutes per week; 5 days a week-30 minutes per day. Avoid red meat/beef/steak,  fried foods. junk foods, sodas, sugary drinks, unhealthy snacking, alcohol and smoking.  Drink at least 80 oz of water per day and monitor your carbohydrate intake daily.    Abnormal TSH -     TSH  Leukocytosis, unspecified type -     CBC with Differential  Gastroesophageal reflux disease with esophagitis without hemorrhage -      metoCLOPramide (REGLAN) 10 MG tablet; Take 1 tablet (10 mg total) by mouth every 6 (six) hours as needed for nausea (and headache). -     pantoprazole (PROTONIX) 20 MG tablet; Take 1 tablet (20 mg total) by mouth daily. INSTRUCTIONS: Avoid GERD Triggers: acidic, spicy or fried foods, caffeine, coffee, sodas,  alcohol and chocolate.   Cardiomyopathy due to hypertension, without heart failure [name] -     nitroGLYCERIN (NITROSTAT) 0.4 MG SL tablet; Place 1 tablet (0.4 mg total) under the tongue every 5 (five) minutes as needed for chest pain.    Patient has been counseled on age-appropriate routine health concerns for screening and prevention. These are reviewed and up-to-date. Referrals have been placed accordingly. Immunizations are up-to-date or declined.    Subjective:   Chief Complaint  Patient presents with   Follow-up    Pt. is here for a 3 months F.U.   HPI Brandy Roberts 49 y.o. female presents to office today for follow up.  has a past medical history of Anemia, Anxiety, Chronic ischemic vertebrobasilar artery thalamic stroke, Depression, Hyperlipidemia, Hypertension, Migraines, Nonrheumatic aortic (valve) insufficiency, Osteoarthritis, Sleep apnea, and Stroke (Brandy Roberts) (10/25/2016). She is being followed by  sees cardiology for  She sees Neurology    Essential Hypertension She stopped taking lisinopril because she states it  made her cough and throw up. She is monitoring her blood pressure at home and reports normal readings. Taking amlodipine 10 mg daily, HCTZ 12.5 mg daily, hydralazine 50 mg TID, lopressor 25 mg BID and spironolactone 25 mg daily. Denies chest pain, shortness of breath, palpitations, lightheadedness, dizziness, headaches or BLE edema.   BP Readings from Last 3 Encounters:  02/24/20 120/68  02/20/20 109/67  12/05/19 (!) 155/76   Prediabetes Well controlled without the use of oral diabetic agents.  Endorses neuropathy in lower extremities. LDl at goal with high  intensity statin lipitor 80 mg daily.  Lab Results  Component Value Date   HGBA1C 5.9 (A) 02/20/2020   Lab Results  Component Value Date   LDLCALC 66 07/18/2019     Review of Systems  Constitutional: Negative for fever, malaise/fatigue and weight loss.  HENT: Negative.  Negative for nosebleeds.   Eyes: Negative.  Negative for blurred vision, double vision and photophobia.  Respiratory: Negative.  Negative for cough and shortness of breath.   Cardiovascular: Negative.  Negative for chest pain, palpitations and leg swelling.  Gastrointestinal: Positive for heartburn. Negative for nausea and vomiting.  Musculoskeletal: Negative.  Negative for myalgias.  Neurological: Positive for sensory change. Negative for dizziness, focal weakness, seizures and headaches.  Psychiatric/Behavioral: Negative.  Negative for suicidal ideas.    Past Medical History:  Diagnosis Date   Anemia    Anxiety    Chronic ischemic vertebrobasilar artery thalamic stroke    Depression    Hyperlipidemia    Hypertension    Migraines    Nonrheumatic aortic (valve) insufficiency    Osteoarthritis    Sleep apnea    Stroke (Brandy Roberts) 10/25/2016    Past Surgical History:  Procedure Laterality Date   CESAREAN SECTION     GANGLION CYST EXCISION     TEE WITHOUT CARDIOVERSION N/A 10/09/2014   Procedure: TRANSESOPHAGEAL ECHOCARDIOGRAM (TEE);  Surgeon: Sanda Klein, MD;  Location: [facility] ENDOSCOPY;  Service: Cardiovascular;  Laterality: N/A;   TONSILLECTOMY      Family History  Problem Relation Age of Onset   Cancer Mother        type unknown   Hypertension Mother    Hypertension Sister    Diabetes Sister    Cancer Maternal Aunt        4 aunts died of cancer types unknown    Social History Reviewed with no changes to be made today.   Outpatient Medications Prior to Visit  Medication Sig Dispense Refill   clopidogrel (PLAVIX) 75 MG tablet Take 1 tablet (75 mg total) by mouth daily. 90 tablet  3   Elastic Bandages & Supports (WRIST/THUMB SPLINT/RIGHT MED) MISC 1 each by Does not apply route daily. 1 each 0   EMGALITY 120 MG/ML SOAJ INJECT 1 ML (120 MG) SUBCUTANEOUSLY EVERY 30 DAYS 1 pen 5   flurbiprofen (ANSAID) 100 MG tablet TAKE 1 TABLET BY MOUTH EVERY 8 HOURS AS NEEDED (MAX 3 TABS IN 24 HOURS) 60 tablet 0   Misc. Devices MISC Please provide patient with insurance approved blood pressure monitor. 1 each 0   Misc. Devices MISC Please provide Left foot AFO M21.372 G81.94 1 each 0   mupirocin ointment (BACTROBAN) 2 % Apply 1 application topically 2 (two) times daily. 60 g 1   traZODone (DESYREL) 100 MG tablet Take 1 tablet (100 mg total) by mouth at bedtime. 90 tablet 1   venlafaxine XR (EFFEXOR-XR) 75 MG 24 hr capsule TAKE 3 CAPSULES (225 MG) BY MOUTH  DAILY 90 capsule 3   amLODipine (NORVASC) 10 MG tablet Take 1 tablet (10 mg total) by mouth daily. 90 tablet 1   atorvastatin (LIPITOR) 80 MG tablet TAKE 1 TABLET BY MOUTH EVERY DAY AT 6PM 90 tablet 1   gabapentin (NEURONTIN) 300 MG capsule Take 1 capsule (300 mg total) by mouth 2 (two) times daily. 180 capsule 1   metoCLOPramide (REGLAN) 10 MG tablet Take 1 tablet (10 mg total) by mouth every 6 (six) hours as needed for nausea (and headache). 30 tablet 2   pantoprazole (PROTONIX) 20 MG tablet Take 1 tablet (20 mg total) by mouth daily. 90 tablet 1   predniSONE (DELTASONE) 10 MG tablet Take 6 tabs (22m) day 1, 5 tabs (570m day 2, 4 tabs (4023mday 3, 3 tabs (60m62may 4, 2 tabs (20mg10my 5, 1 tab (10mg)35m 6 then STOP for headache/migraine 21 tablet 0   hydrALAZINE (APRESOLINE) 50 MG tablet Take 1 tablet (50 mg total) by mouth 3 (three) times daily. 270 tablet 1   hydrochlorothiazide (MICROZIDE) 12.5 MG capsule Take 1 capsule (12.5 mg total) by mouth daily. 30 capsule 3   lisinopril (ZESTRIL) 40 MG tablet Take 1 tablet (40 mg total) by mouth daily. (Patient not taking: Reported on 02/20/2020) 90 tablet 1   metoprolol  tartrate (LOPRESSOR) 25 MG tablet Take 1 tablet (25 mg total) by mouth 2 (two) times daily. 180 tablet 1   nitroGLYCERIN (NITROSTAT) 0.4 MG SL tablet Place 1 tablet (0.4 mg total) under the tongue every 5 (five) minutes as needed for chest pain. 90 tablet 3   spironolactone (ALDACTONE) 25 MG tablet Take 1 tablet (25 mg total) by mouth daily. 90 tablet 0   No facility-administered medications prior to visit.    Allergies  Allergen Reactions   Ibuprofen     Can not take with current medications   Tylenol [Acetaminophen] Other (See Comments)    Pt stated tylenol gives her extreme headache       Objective:    BP 109/67 (BP Location: Right Arm, Patient Position: Sitting, Cuff Size: Normal)    Pulse (!) 57    Temp 97.7 F (36.5 C) (Temporal)    Ht '5\' 6"'  (1.676 m)    Wt 198 lb (89.8 kg)    SpO2 99%    BMI 31.96 kg/m  Wt Readings from Last 3 Encounters:  02/24/20 194 lb 1.9 oz (88.1 kg)  02/20/20 198 lb (89.8 kg)  12/05/19 200 lb (90.7 kg)    Physical Exam Vitals and nursing note reviewed.  Constitutional:      Appearance: She is well-developed.  HENT:     Head: Normocephalic and atraumatic.  Cardiovascular:     Rate and Rhythm: Normal rate and regular rhythm.     Heart sounds: Normal heart sounds. No murmur heard.  No friction rub. No gallop.   Pulmonary:     Effort: Pulmonary effort is normal. No tachypnea or respiratory distress.     Breath sounds: Normal breath sounds. No decreased breath sounds, wheezing, rhonchi or rales.  Chest:     Chest wall: No tenderness.  Abdominal:     General: Bowel sounds are normal.     Palpations: Abdomen is soft.  Musculoskeletal:        General: Normal range of motion.     Cervical back: Normal range of motion.  Skin:    General: Skin is warm and dry.  Neurological:     Mental Status: She  is alert and oriented to person, place, and time.     Coordination: Coordination normal.  Psychiatric:        Behavior: Behavior normal. Behavior  is cooperative.        Thought Content: Thought content normal.        Judgment: Judgment normal.          Patient has been counseled extensively about nutrition and exercise as well as the importance of adherence with medications and regular follow-up. The patient was given clear instructions to go to ER or return to medical center if symptoms don't improve, worsen or new problems develop. The patient verbalized understanding.   Follow-up: Return in about 3 months (around 05/21/2020).   [name], FNP-BC [facility] [place], [place]

## 2020-02-21 LAB — CBC WITH DIFFERENTIAL/PLATELET
Basophils Absolute: 0 10*3/uL (ref 0.0–0.2)
Basos: 1 %
EOS (ABSOLUTE): 0.4 10*3/uL (ref 0.0–0.4)
Eos: 6 %
Hematocrit: 40.7 % (ref 34.0–46.6)
Hemoglobin: 12.7 g/dL (ref 11.1–15.9)
Immature Grans (Abs): 0 10*3/uL (ref 0.0–0.1)
Immature Granulocytes: 0 %
Lymphocytes Absolute: 2 10*3/uL (ref 0.7–3.1)
Lymphs: 33 %
MCH: 27.4 pg (ref 26.6–33.0)
MCHC: 31.2 g/dL — ABNORMAL LOW (ref 31.5–35.7)
MCV: 88 fL (ref 79–97)
Monocytes Absolute: 0.6 10*3/uL (ref 0.1–0.9)
Monocytes: 10 %
Neutrophils Absolute: 3.1 10*3/uL (ref 1.4–7.0)
Neutrophils: 50 %
Platelets: 231 10*3/uL (ref 150–450)
RBC: 4.64 x10E6/uL (ref 3.77–5.28)
RDW: 15.3 % (ref 11.7–15.4)
WBC: 6.2 10*3/uL (ref 3.4–10.8)

## 2020-02-21 LAB — CMP14+EGFR
ALT: 32 IU/L (ref 0–32)
AST: 24 IU/L (ref 0–40)
Albumin/Globulin Ratio: 1.3 (ref 1.2–2.2)
Albumin: 4.3 g/dL (ref 3.8–4.8)
Alkaline Phosphatase: 101 IU/L (ref 44–121)
BUN/Creatinine Ratio: 21 (ref 9–23)
BUN: 24 mg/dL (ref 6–24)
Bilirubin Total: 0.2 mg/dL (ref 0.0–1.2)
CO2: 24 mmol/L (ref 20–29)
Calcium: 10.1 mg/dL (ref 8.7–10.2)
Chloride: 103 mmol/L (ref 96–106)
Creatinine, Ser: 1.14 mg/dL — ABNORMAL HIGH (ref 0.57–1.00)
GFR calc Af Amer: 65 mL/min/{1.73_m2} (ref >59)
GFR calc non Af Amer: 56 mL/min/{1.73_m2} — ABNORMAL LOW (ref >59)
Globulin, Total: 3.2 g/dL (ref 1.5–4.5)
Glucose: 116 mg/dL — ABNORMAL HIGH (ref 65–99)
Potassium: 4.9 mmol/L (ref 3.5–5.2)
Sodium: 141 mmol/L (ref 134–144)
Total Protein: 7.5 g/dL (ref 6.0–8.5)

## 2020-02-21 LAB — TSH: TSH: 2.15 u[IU]/mL (ref 0.450–4.500)

## 2020-02-21 LAB — HEPATITIS C ANTIBODY: Hep C Virus Ab: <0.1 s/co ratio (ref 0.0–0.9)

## 2020-02-24 ENCOUNTER — Encounter: Payer: Self-pay | Admitting: Cardiology

## 2020-02-24 ENCOUNTER — Ambulatory Visit (INDEPENDENT_AMBULATORY_CARE_PROVIDER_SITE_OTHER): Payer: Medicare Other | Admitting: Cardiology

## 2020-02-24 ENCOUNTER — Other Ambulatory Visit: Payer: Self-pay

## 2020-02-24 VITALS — BP 120/68 | HR 52 | Ht 66.0 in | Wt 194.1 lb

## 2020-02-24 DIAGNOSIS — N950 Postmenopausal bleeding: Secondary | ICD-10-CM

## 2020-02-24 DIAGNOSIS — E782 Mixed hyperlipidemia: Secondary | ICD-10-CM | POA: Diagnosis not present

## 2020-02-24 DIAGNOSIS — I351 Nonrheumatic aortic (valve) insufficiency: Secondary | ICD-10-CM | POA: Diagnosis not present

## 2020-02-24 DIAGNOSIS — I10 Essential (primary) hypertension: Secondary | ICD-10-CM

## 2020-02-24 DIAGNOSIS — I6930 Unspecified sequelae of cerebral infarction: Secondary | ICD-10-CM

## 2020-02-24 NOTE — Progress Notes (Signed)
Cardiology Office Note:    Date:  02/24/2020   ID:  [name], DOB August 10, 1970, MRN [id number]  PCP:  [name], NP  Cardiologist:  Jenean Lindau, MD   Referring MD: [name], NP    ASSESSMENT:    1. Essential hypertension   2. Chronic ischemic vertebrobasilar artery thalamic stroke   3. Aortic valve insufficiency, etiology of cardiac valve disease unspecified   4. Post-menopausal bleeding   5. Mixed hyperlipidemia    PLAN:    In order of problems listed above:  1. Primary prevention stressed with the patient.  Importance of compliance with diet medication stressed and she vocalized understanding. 2. Essential hypertension: Blood pressure stable and diet was emphasized 3. Mixed dyslipidemia: Diet was emphasized.  She recently had blood work with her primary care physician who is following her lipids.  We will try to get a copy of those records. 4. I advised the patient about diet and losing weight.  She cannot ambulate much because of stroke.Patient will be seen in follow-up appointment in 6 months or earlier if the patient has any concerns.  Patient had multiple questions which were answered to her satisfaction.   Medication Adjustments/Labs and Tests Ordered: Current medicines are reviewed at length with the patient today.  Concerns regarding medicines are outlined above.  Orders Placed This Encounter  Procedures  . EKG 12-Lead   No orders of the defined types were placed in this encounter.    No chief complaint on file.    History of Present Illness:    Brandy Roberts is a 49 y.o. female.  Patient has past medical history of essential hypertension dyslipidemia and history of stroke.  She denies any problems at this time and takes care of activities of daily living.  No chest pain orthopnea or PND.  At the time of my evaluation, the patient is alert awake oriented and in no distress.  She leads a sedentary lifestyle because of issues related  to stroke in the past.  Past Medical History:  Diagnosis Date  . Anemia   . Anxiety   . Chronic ischemic vertebrobasilar artery thalamic stroke   . Depression   . Hyperlipidemia   . Hypertension   . Migraines   . Nonrheumatic aortic (valve) insufficiency   . Osteoarthritis   . Sleep apnea   . Stroke [facility]) 10/25/2016    Past Surgical History:  Procedure Laterality Date  . CESAREAN SECTION    . GANGLION CYST EXCISION    . TEE WITHOUT CARDIOVERSION N/A 10/09/2014   Procedure: TRANSESOPHAGEAL ECHOCARDIOGRAM (TEE);  Surgeon: Sanda Klein, MD;  Location: [facility] ENDOSCOPY;  Service: Cardiovascular;  Laterality: N/A;  . TONSILLECTOMY      Current Medications: Current Meds  Medication Sig  . amLODipine (NORVASC) 10 MG tablet Take 1 tablet (10 mg total) by mouth daily.  [name] atorvastatin (LIPITOR) 80 MG tablet TAKE 1 TABLET BY MOUTH EVERY DAY AT 6PM  . clopidogrel (PLAVIX) 75 MG tablet Take 1 tablet (75 mg total) by mouth daily.  . Elastic Bandages & Supports (WRIST/THUMB SPLINT/RIGHT MED) MISC 1 each by Does not apply route daily.  [name] EMGALITY 120 MG/ML SOAJ INJECT 1 ML (120 MG) SUBCUTANEOUSLY EVERY 30 DAYS  . flurbiprofen (ANSAID) 100 MG tablet TAKE 1 TABLET BY MOUTH EVERY 8 HOURS AS NEEDED (MAX 3 TABS IN 24 HOURS)  . gabapentin (NEURONTIN) 300 MG capsule Take 1 capsule (300 mg total) by mouth 2 (two) times daily.  . hydrALAZINE (  APRESOLINE) 50 MG tablet Take 1 tablet (50 mg total) by mouth 3 (three) times daily.  . hydrochlorothiazide (MICROZIDE) 12.5 MG capsule Take 1 capsule (12.5 mg total) by mouth daily.  . metoCLOPramide (REGLAN) 10 MG tablet Take 1 tablet (10 mg total) by mouth every 6 (six) hours as needed for nausea (and headache).  . metoprolol tartrate (LOPRESSOR) 25 MG tablet Take 1 tablet (25 mg total) by mouth 2 (two) times daily.  . Misc. Devices MISC Please provide patient with insurance approved blood pressure monitor.  . Misc. Devices MISC Please provide Left foot AFO  M21.372 G81.94  . mupirocin ointment (BACTROBAN) 2 % Apply 1 application topically 2 (two) times daily.  . nitroGLYCERIN (NITROSTAT) 0.4 MG SL tablet Place 1 tablet (0.4 mg total) under the tongue every 5 (five) minutes as needed for chest pain.  . pantoprazole (PROTONIX) 20 MG tablet Take 1 tablet (20 mg total) by mouth daily.  [name] spironolactone (ALDACTONE) 25 MG tablet Take 1 tablet (25 mg total) by mouth daily.  . traZODone (DESYREL) 100 MG tablet Take 1 tablet (100 mg total) by mouth at bedtime.  [name] venlafaxine XR (EFFEXOR-XR) 75 MG 24 hr capsule TAKE 3 CAPSULES (225 MG) BY MOUTH DAILY     Allergies:   Ibuprofen and Tylenol [acetaminophen]   Social History   Socioeconomic History  . Marital status: Single    Spouse name: Not on file  . Number of children: 2  . Years of education: 63  . Highest education level: High school graduate  Occupational History  . Occupation: disable   Tobacco Use  . Smoking status: Former Smoker    Packs/day: 0.25    Quit date: 08/31/2017    Years since quitting: 2.4  . Smokeless tobacco: Never Used  Vaping Use  . Vaping Use: Never used  Substance and Sexual Activity  . Alcohol use: No    Alcohol/week: 0.0 standard drinks  . Drug use: Yes    Types: Marijuana  . Sexual activity: Not on file  Other Topics Concern  . Not on file  Social History Narrative   Patient lives with her uncle in a one story home.  Has 2 sons.  Currently on disability.  Education: high school.   Drinks 1-2 sodas a week       Social Determinants of Health   Financial Resource Strain:   . Difficulty of Paying Living Expenses: Not on file  Food Insecurity:   . Worried About Charity fundraiser in the Last Year: Not on file  . Ran Out of Food in the Last Year: Not on file  Transportation Needs:   . Lack of Transportation (Medical): Not on file  . Lack of Transportation (Non-Medical): Not on file  Physical Activity:   . Days of Exercise per Week: Not on file  . Minutes  of Exercise per Session: Not on file  Stress:   . Feeling of Stress : Not on file  Social Connections:   . Frequency of Communication with Friends and Family: Not on file  . Frequency of Social Gatherings with Friends and Family: Not on file  . Attends Religious Services: Not on file  . Active Member of Clubs or Organizations: Not on file  . Attends Archivist Meetings: Not on file  . Marital Status: Not on file     Family History: The patient's family history includes Cancer in her maternal aunt and mother; Diabetes in her sister; Hypertension in her mother  and sister.  ROS:   Please see the history of present illness.    All other systems reviewed and are negative.  EKGs/Labs/Other Studies Reviewed:    The following studies were reviewed today: I discussed my findings with the patient at length.  EKG reveals sinus rhythm and nonspecific ST-T changes with   Recent Labs: 02/20/2020: ALT 32; BUN 24; Creatinine, Ser 1.14; Hemoglobin 12.7; Platelets 231; Potassium 4.9; Sodium 141; TSH 2.150  Recent Lipid Panel    Component Value Date/Time   CHOL 124 07/18/2019 0949   TRIG 55 07/18/2019 0949   HDL 46 07/18/2019 0949   CHOLHDL 2.7 07/18/2019 0949   CHOLHDL 3.3 10/25/2016 0248   VLDL 8 10/25/2016 0248   LDLCALC 66 07/18/2019 0949    Physical Exam:    VS:  BP 120/68   Pulse (!) 52   Ht 5\' 6"  (1.676 m)   Wt 194 lb 1.9 oz (88.1 kg)   SpO2 96%   BMI 31.33 kg/m     Wt Readings from Last 3 Encounters:  02/24/20 194 lb 1.9 oz (88.1 kg)  02/20/20 198 lb (89.8 kg)  12/05/19 200 lb (90.7 kg)     GEN: Patient is in no acute distress HEENT: Normal NECK: No JVD; No carotid bruits LYMPHATICS: No lymphadenopathy CARDIAC: Hear sounds regular, 2/6 systolic murmur at the apex. RESPIRATORY:  Clear to auscultation without rales, wheezing or rhonchi  ABDOMEN: Soft, non-tender, non-distended MUSCULOSKELETAL:  No edema; No deformity  SKIN: Warm and dry NEUROLOGIC:  Alert  and oriented x 3 PSYCHIATRIC:  Normal affect   Signed, Jenean Lindau, MD  02/24/2020 2:04 PM    [place]

## 2020-02-24 NOTE — Patient Instructions (Signed)

## 2020-02-27 ENCOUNTER — Encounter: Payer: Self-pay | Admitting: Nurse Practitioner

## 2020-03-02 ENCOUNTER — Other Ambulatory Visit: Payer: Self-pay

## 2020-03-02 ENCOUNTER — Emergency Department (HOSPITAL_COMMUNITY)
Admission: EM | Admit: 2020-03-02 | Discharge: 2020-03-02 | Disposition: A | Discharge status: Home / Self Care | Payer: Medicare Other | Attending: Emergency Medicine | Admitting: Emergency Medicine

## 2020-03-02 ENCOUNTER — Encounter (HOSPITAL_COMMUNITY): Payer: Self-pay

## 2020-03-02 DIAGNOSIS — M5489 Other dorsalgia: Secondary | ICD-10-CM | POA: Diagnosis not present

## 2020-03-02 DIAGNOSIS — M549 Dorsalgia, unspecified: Secondary | ICD-10-CM | POA: Diagnosis present

## 2020-03-02 DIAGNOSIS — M5459 Other low back pain: Secondary | ICD-10-CM | POA: Insufficient documentation

## 2020-03-02 DIAGNOSIS — Z5321 Procedure and treatment not carried out due to patient leaving prior to being seen by health care provider: Secondary | ICD-10-CM | POA: Insufficient documentation

## 2020-03-02 DIAGNOSIS — Z743 Need for continuous supervision: Secondary | ICD-10-CM | POA: Diagnosis not present

## 2020-03-02 NOTE — ED Notes (Signed)
Pt left without being seen.

## 2020-03-02 NOTE — ED Notes (Signed)
outside

## 2020-03-02 NOTE — ED Triage Notes (Signed)
Pt from home with ems c.o lower back pain, states she was just discharged from [facility] yesterday for behavioral health issues and thinks her pain is coming from laying in the bed there. Pt a.o, nad noted

## 2020-03-04 DIAGNOSIS — I639 Cerebral infarction, unspecified: Secondary | ICD-10-CM | POA: Diagnosis not present

## 2020-03-05 ENCOUNTER — Other Ambulatory Visit: Payer: Self-pay | Admitting: Nurse Practitioner

## 2020-03-05 NOTE — Telephone Encounter (Signed)
Requested Prescriptions  Pending Prescriptions Disp Refills  . flurbiprofen (ANSAID) 100 MG tablet [Pharmacy Med Name: FLURBIPROFEN 100 MG TAB] 60 tablet 0    Sig: TAKE 1 TABLET BY MOUTH EVERY 8 HOURS AS NEEDED (MAX 3 TABS IN 24 HOURS)     Analgesics:  NSAIDS Failed - 03/05/2020  3:19 PM      Failed - Cr in normal range and within 360 days    Creat  Date Value Ref Range Status  07/24/2016 1.01 0.50 - 1.10 mg/dL Final   Creatinine, Ser  Date Value Ref Range Status  02/20/2020 1.14 (H) 0.57 - 1.00 mg/dL Final         Passed - HGB in normal range and within 360 days    Hemoglobin  Date Value Ref Range Status  02/20/2020 12.7 11.1 - 15.9 g/dL Final         Passed - Patient is not pregnant      Passed - Valid encounter within last 12 months    Recent Outpatient Visits          2 weeks ago Essential hypertension   [place], [name], NP   4 months ago Essential hypertension   [place], [place] W, NP   7 months ago Low serum thyroid stimulating hormone (TSH)   [place], [name], NP   8 months ago Essential hypertension   [place], [name], NP   9 months ago Encounter for Papanicolaou smear for cervical cancer screening   [place], [name], NP      Future Appointments            In 2 weeks Camillia Herter, NP [place]   In 2 months [name], NP [place]   In 5 months Revankar, Reita Cliche, MD [facility]

## 2020-03-20 ENCOUNTER — Ambulatory Visit: Payer: Medicare Other | Attending: Family | Admitting: Family

## 2020-03-20 ENCOUNTER — Other Ambulatory Visit: Payer: Self-pay

## 2020-03-20 DIAGNOSIS — Z09 Encounter for follow-up examination after completed treatment for conditions other than malignant neoplasm: Secondary | ICD-10-CM | POA: Diagnosis not present

## 2020-03-20 DIAGNOSIS — F32A Depression, unspecified: Secondary | ICD-10-CM

## 2020-03-20 NOTE — Progress Notes (Signed)
Virtual Visit via Telephone Note  I connected with Brandy Roberts, on 03/20/2020 at 3:11 PM by telephone due to the COVID-19 pandemic and verified that I am speaking with the correct person using two identifiers.  Due to current restrictions/limitations of in-office visits due to the COVID-19 pandemic, this scheduled clinical appointment was converted to a telehealth visit.   Consent: I discussed the limitations, risks, security and privacy concerns of performing an evaluation and management service by telephone and the availability of in person appointments. I also discussed with the patient that there may be a patient responsible charge related to this service. The patient expressed understanding and agreed to proceed.  Location of Patient: Home  Location of Provider: [place] [facility] [place]  Persons participating in Telemedicine visit: Brandy Roberts Brandy Roberts Brandy Brine, NP Orlan Leavens, CMA  History of Present Illness: Subjective: Brandy Roberts is a 49 y.o. female with history of hypertensive heart disease, aortic regurgitation, cardiomyopathy due to hypertension, essential hypertension, migraine, and hyperlipidemia who presents for hospital follow-up.  1. ER FOLLOW UP: 03/01/2020: Visit at the [facility] Emergency Department for psychiatric evaluation, depression, anxiety, social discord, and marijuana use. Patient presented with evaluation for stress reaction argument where she made statements about wanting to harm herself. Patient had an argument with her son and her son's girlfriend. Reported when her son and his girlfriend came to her house to pick up their children after she watched them all day she asked for some money for food which caused an argument. Patient appeared to have acute stress reaction but no wish to harm herself at that time. Discharged home/self care.  03/20/2020: Time since discharge: 19  days Hospital/facility: [facility] Diagnosis: psychiatric evaluation, depression, anxiety, social discord, and marijuana use Procedures/tests: Covid antigen, ethanol, CMP, CBC w/ differential, urinalysis, urine drug screen, pregnancy urine Consultants: psychiatric evaluation New medications: none Discharge instructions: home/ self care, follow-up with PCP, follow-up with psychiatrist/neurologist Status: Stable. Denies thoughts of self-harm and suicidal ideation. Says she still has disagreements with her son sometimes if he makes her upset. Denies alcohol use. Using marijuana and says this helps her feel better. Taking Effexor as prescribed and says it is not helping. Mental health comes by her house to check on her and gives her activities to do. Says she has appoint with mental health Dr. Burt Knack on tomorrow.  Past Medical History:  Diagnosis Date  . Anemia   . Anxiety   . Chronic ischemic vertebrobasilar artery thalamic stroke   . Depression   . Hyperlipidemia   . Hypertension   . Migraines   . Nonrheumatic aortic (valve) insufficiency   . Osteoarthritis   . Sleep apnea   . Stroke ([place]) 10/25/2016   Allergies  Allergen Reactions  . Ibuprofen     Can not take with current medications  . Tylenol [Acetaminophen] Other (See Comments)    Pt stated tylenol gives her extreme headache    Current Outpatient Medications on File Prior to Visit  Medication Sig Dispense Refill  . amLODipine (NORVASC) 10 MG tablet Take 1 tablet (10 mg total) by mouth daily. 90 tablet 1  . atorvastatin (LIPITOR) 80 MG tablet TAKE 1 TABLET BY MOUTH EVERY DAY AT 6PM 90 tablet 1  . clopidogrel (PLAVIX) 75 MG tablet Take 1 tablet (75 mg total) by mouth daily. 90 tablet 3  . Elastic Bandages & Supports (WRIST/THUMB SPLINT/RIGHT MED) MISC 1 each by Does not apply route  daily. 1 each 0  . EMGALITY 120 MG/ML SOAJ INJECT 1 ML (120 MG) SUBCUTANEOUSLY EVERY 30 DAYS 1 pen 5  . flurbiprofen  (ANSAID) 100 MG tablet TAKE 1 TABLET BY MOUTH EVERY 8 HOURS AS NEEDED (MAX 3 TABS IN 24 HOURS) 60 tablet 0  . gabapentin (NEURONTIN) 300 MG capsule Take 1 capsule (300 mg total) by mouth 2 (two) times daily. 180 capsule 1  . hydrALAZINE (APRESOLINE) 50 MG tablet Take 1 tablet (50 mg total) by mouth 3 (three) times daily. 270 tablet 1  . hydrochlorothiazide (MICROZIDE) 12.5 MG capsule Take 1 capsule (12.5 mg total) by mouth daily. 90 capsule 1  . metoCLOPramide (REGLAN) 10 MG tablet Take 1 tablet (10 mg total) by mouth every 6 (six) hours as needed for nausea (and headache). 30 tablet 2  . metoprolol tartrate (LOPRESSOR) 25 MG tablet Take 1 tablet (25 mg total) by mouth 2 (two) times daily. 180 tablet 1  . Misc. Devices MISC Please provide patient with insurance approved blood pressure monitor. 1 each 0  . Misc. Devices MISC Please provide Left foot AFO M21.372 G81.94 1 each 0  . mupirocin ointment (BACTROBAN) 2 % Apply 1 application topically 2 (two) times daily. 60 g 1  . nitroGLYCERIN (NITROSTAT) 0.4 MG SL tablet Place 1 tablet (0.4 mg total) under the tongue every 5 (five) minutes as needed for chest pain. 90 tablet 3  . pantoprazole (PROTONIX) 20 MG tablet Take 1 tablet (20 mg total) by mouth daily. 90 tablet 1  . spironolactone (ALDACTONE) 25 MG tablet Take 1 tablet (25 mg total) by mouth daily. 90 tablet 1  . traZODone (DESYREL) 100 MG tablet Take 1 tablet (100 mg total) by mouth at bedtime. 90 tablet 1  . venlafaxine XR (EFFEXOR-XR) 75 MG 24 hr capsule TAKE 3 CAPSULES (225 MG) BY MOUTH DAILY 90 capsule 3   No current facility-administered medications on file prior to visit.    Observations/Objective: Alert and oriented x 3. Not in acute distress. Physical examination not completed as this is a telemedicine visit.  Assessment and Plan: 1. Hospital discharge follow-up: 2. Depression, unspecified depression type: - Visit at the [facility]  Emergency Department on 02/07/2020 for psychiatric evaluation, depression, anxiety, social discord, and marijuana use. After evaluation she was discharged to home/self care. -  Today she reports she is stable. Denies thoughts of self-harm and suicidal ideation. Says she still has disagreements with her son sometimes if he makes her upset. Denies alcohol use. Using marijuana and says this helps her feel better. Taking Venlafaxine as prescribed and says it is not helping. Mental health comes by her house to check on her and gives her activities to do. Says she has appoint with mental health Dr. Burt Knack on tomorrow. - Encouraged patient to keep appointment with mental health/Psychiatry on tomorrow. - Follow-up with primary provider as needed.   Follow Up Instructions: Keep appointment with Psychiatry. Follow-up with primary provider as needed.   Patient was given clear instructions to go to Emergency Department or return to medical center if symptoms don't improve, worsen, or new problems develop.The patient verbalized understanding.  I discussed the assessment and treatment plan with the patient. The patient was provided an opportunity to ask questions and all were answered. The patient agreed with the plan and demonstrated an understanding of the instructions.   The patient was advised to call back or seek an in-person evaluation if the symptoms worsen or  if the condition fails to improve as anticipated.   I provided 5 minutes total of non-face-to-face time during this encounter including median intraservice time, reviewing previous notes, labs, imaging, medications, management and patient verbalized understanding.    Camillia Herter, NP  [facility] [place], [place]   03/20/2020, 3:11 PM

## 2020-03-20 NOTE — Patient Instructions (Signed)

## 2020-03-21 ENCOUNTER — Telehealth: Payer: Self-pay | Admitting: *Deleted

## 2020-03-21 NOTE — Telephone Encounter (Signed)
Copied from [place] [id number]. Topic: General - Inquiry >> Mar 20, 2020  3:29 PM Gillis Ends D wrote: Reason for CRM: Seen patient yesterday that she would be better off dead but doesn't have a plan to harm herself. NP [place] with [employer] said she can be reached at [phone] if you need further information. Please advise

## 2020-03-23 ENCOUNTER — Telehealth: Payer: Medicare Other | Admitting: Neurology

## 2020-04-03 DIAGNOSIS — I639 Cerebral infarction, unspecified: Secondary | ICD-10-CM | POA: Diagnosis not present

## 2020-04-08 ENCOUNTER — Other Ambulatory Visit: Payer: Self-pay | Admitting: Nurse Practitioner

## 2020-04-09 ENCOUNTER — Ambulatory Visit: Payer: Medicare Other | Attending: Nurse Practitioner | Admitting: Nurse Practitioner

## 2020-04-09 ENCOUNTER — Other Ambulatory Visit: Payer: Self-pay

## 2020-04-09 VITALS — BP 144/73 | HR 58 | Temp 97.7°F | Ht 66.0 in | Wt 191.0 lb

## 2020-04-09 DIAGNOSIS — B351 Tinea unguium: Secondary | ICD-10-CM

## 2020-04-09 DIAGNOSIS — R7303 Prediabetes: Secondary | ICD-10-CM | POA: Diagnosis not present

## 2020-04-09 LAB — GLUCOSE, POCT (MANUAL RESULT ENTRY): POC Glucose: 117 mg/dl — AB (ref 70–99)

## 2020-04-09 MED ORDER — TERBINAFINE HCL 250 MG PO TABS: 250.0000 mg | ORAL_TABLET | Freq: Every day | ORAL | 1 refills | Status: AC

## 2020-04-09 NOTE — Progress Notes (Signed)
Assessment & Plan:  Kitiara was seen today for nail problem.  Diagnoses and all orders for this visit:  Onychomycosis -     Ambulatory referral to Podiatry -     terbinafine (LAMISIL) 250 MG tablet; Take 1 tablet (250 mg total) by mouth daily. -     CMP14+EGFR  Prediabetes -     Glucose (CBG) -     CMP14+EGFR    Patient has been counseled on age-appropriate routine health concerns for screening and prevention. These are reviewed and up-to-date. Referrals have been placed accordingly. Immunizations are up-to-date or declined.    Subjective:   Chief Complaint  Patient presents with  . Nail Problem    Pt. stated her toe nail and her finger nail fell off. Pt. is concern about her weight loss.    HPI Lavaughn Bisig 49 y.o. female presents to office today with concerns of traumatic nail avulsion of  left Hallux and the right ring fingernail. She has significant onychomycosis of all toenails and fingernails. Wearing acrylic over most of her fingernails today. I have suggested that she discontinue wearing artificial nails today and have started her on lamisil for systemic treatment.   In regard to the right ring finger nail it was torn off after one of her acrylics broke off. Neither the right finger or left big toenail show any signs of infection and both nail beds are dry and skin is intact. There are actually parts of the nails that are growing back at this time.    Prediabetes Diet controlled only at this time.  Lab Results  Component Value Date   HGBA1C 5.9 (A) 02/20/2020   Esesnt  Depression screen [facility] 2/9 04/09/2020 02/20/2020 05/04/2019 09/30/2018 07/14/2018  Decreased Interest 1 1 0 1 1  Down, Depressed, Hopeless 1 1 0 1 1  PHQ - 2 Score 2 2 0 2 2  Altered sleeping 1 1 0 1 1  Tired, decreased energy 1 2 0 1 2  Change in appetite 1 2 0 0 2  Feeling bad or failure about yourself  1 2 0 0 1  Trouble concentrating 1 2 0 0 2  Moving slowly or fidgety/restless 1 2 0 0 1    Suicidal thoughts 1 2 0 0 1  PHQ-9 Score 9 15 0 4 12  Difficult doing work/chores - - - Somewhat difficult -  Some recent data might be hidden   GAD 7 : Generalized Anxiety Score 04/09/2020 02/20/2020 05/04/2019 07/14/2018  Nervous, Anxious, on Edge 0 2 0 1  Control/stop worrying 0 2 0 1  Worry too much - different things 1 2 0 0  Trouble relaxing 1 2 0 0  Restless - 2 0 2  Easily annoyed or irritable 1 2 0 2  Afraid - awful might happen 1 2 0 1  Total GAD 7 Score - 14 0 7    Review of Systems  Constitutional: Negative for fever, malaise/fatigue and weight loss.  HENT: Negative.  Negative for nosebleeds.   Eyes: Negative.  Negative for blurred vision, double vision and photophobia.  Respiratory: Negative.  Negative for cough and shortness of breath.   Cardiovascular: Negative.  Negative for chest pain, palpitations and leg swelling.  Gastrointestinal: Negative.  Negative for heartburn, nausea and vomiting.  Musculoskeletal: Negative.  Negative for myalgias.  Skin:       SEE HPI  Neurological: Negative.  Negative for dizziness, focal weakness, seizures and headaches.  Psychiatric/Behavioral: Negative.  Negative for  suicidal ideas.    Past Medical History:  Diagnosis Date  . Anemia   . Anxiety   . Chronic ischemic vertebrobasilar artery thalamic stroke   . Depression   . Hyperlipidemia   . Hypertension   . Migraines   . Nonrheumatic aortic (valve) insufficiency   . Osteoarthritis   . Sleep apnea   . Stroke [facility]) 10/25/2016    Past Surgical History:  Procedure Laterality Date  . CESAREAN SECTION    . GANGLION CYST EXCISION    . TEE WITHOUT CARDIOVERSION N/A 10/09/2014   Procedure: TRANSESOPHAGEAL ECHOCARDIOGRAM (TEE);  Surgeon: Sanda Klein, MD;  Location: [facility] ENDOSCOPY;  Service: Cardiovascular;  Laterality: N/A;  . TONSILLECTOMY      Family History  Problem Relation Age of Onset  . Cancer Mother        type unknown  . Hypertension Mother   . Hypertension Sister    . Diabetes Sister   . Cancer Maternal Aunt        4 aunts died of cancer types unknown    Social History Reviewed with no changes to be made today.   Outpatient Medications Prior to Visit  Medication Sig Dispense Refill  . amLODipine (NORVASC) 10 MG tablet Take 1 tablet (10 mg total) by mouth daily. 90 tablet 1  . atorvastatin (LIPITOR) 80 MG tablet TAKE 1 TABLET BY MOUTH EVERY DAY AT 6PM 90 tablet 1  . clopidogrel (PLAVIX) 75 MG tablet Take 1 tablet (75 mg total) by mouth daily. 90 tablet 3  . Elastic Bandages & Supports (WRIST/THUMB SPLINT/RIGHT MED) MISC 1 each by Does not apply route daily. 1 each 0  . EMGALITY 120 MG/ML SOAJ INJECT 1 ML (120 MG) SUBCUTANEOUSLY EVERY 30 DAYS 1 pen 5  . flurbiprofen (ANSAID) 100 MG tablet TAKE 1 TABLET BY MOUTH EVERY 8 HOURS AS NEEDED (MAX 3 TABS IN 24 HOURS) 60 tablet 0  . gabapentin (NEURONTIN) 300 MG capsule Take 1 capsule (300 mg total) by mouth 2 (two) times daily. 180 capsule 1  . hydrALAZINE (APRESOLINE) 50 MG tablet Take 1 tablet (50 mg total) by mouth 3 (three) times daily. 270 tablet 1  . hydrochlorothiazide (MICROZIDE) 12.5 MG capsule Take 1 capsule (12.5 mg total) by mouth daily. 90 capsule 1  . metoCLOPramide (REGLAN) 10 MG tablet Take 1 tablet (10 mg total) by mouth every 6 (six) hours as needed for nausea (and headache). 30 tablet 2  . metoprolol tartrate (LOPRESSOR) 25 MG tablet Take 1 tablet (25 mg total) by mouth 2 (two) times daily. 180 tablet 1  . Misc. Devices MISC Please provide patient with insurance approved blood pressure monitor. 1 each 0  . Misc. Devices MISC Please provide Left foot AFO M21.372 G81.94 1 each 0  . mupirocin ointment (BACTROBAN) 2 % Apply 1 application topically 2 (two) times daily. 60 g 1  . nitroGLYCERIN (NITROSTAT) 0.4 MG SL tablet Place 1 tablet (0.4 mg total) under the tongue every 5 (five) minutes as needed for chest pain. 90 tablet 3  . pantoprazole (PROTONIX) 20 MG tablet Take 1 tablet (20 mg total) by  mouth daily. 90 tablet 1  . spironolactone (ALDACTONE) 25 MG tablet Take 1 tablet (25 mg total) by mouth daily. 90 tablet 1  . traZODone (DESYREL) 100 MG tablet Take 1 tablet (100 mg total) by mouth at bedtime. 90 tablet 1  . venlafaxine XR (EFFEXOR-XR) 75 MG 24 hr capsule TAKE 3 CAPSULES (225 MG) BY MOUTH DAILY 90 capsule 3  No facility-administered medications prior to visit.    Allergies  Allergen Reactions  . Ibuprofen     Can not take with current medications  . Tylenol [Acetaminophen] Other (See Comments)    Pt stated tylenol gives her extreme headache       Objective:    BP (!) 144/73 (BP Location: Right Arm, Patient Position: Sitting, Cuff Size: Normal)   Pulse (!) 58   Temp 97.7 F (36.5 C) (Temporal)   Ht '5\' 6"'  (1.676 m)   Wt 191 lb (86.6 kg)   SpO2 100%   BMI 30.83 kg/m  Wt Readings from Last 3 Encounters:  04/09/20 191 lb (86.6 kg)  03/02/20 196 lb (88.9 kg)  02/24/20 194 lb 1.9 oz (88.1 kg)    Physical Exam Vitals and nursing note reviewed.  Constitutional:      Appearance: She is well-developed.  HENT:     Head: Normocephalic and atraumatic.  Cardiovascular:     Rate and Rhythm: Normal rate and regular rhythm.     Heart sounds: Normal heart sounds. No murmur heard.  No friction rub. No gallop.   Pulmonary:     Effort: Pulmonary effort is normal. No tachypnea or respiratory distress.     Breath sounds: Normal breath sounds. No decreased breath sounds, wheezing, rhonchi or rales.  Chest:     Chest wall: No tenderness.  Abdominal:     General: Bowel sounds are normal.     Palpations: Abdomen is soft.  Musculoskeletal:        General: Normal range of motion.     Cervical back: Normal range of motion.  Skin:    General: Skin is warm and dry.  Neurological:     Mental Status: She is alert and oriented to person, place, and time.     Coordination: Coordination normal.  Psychiatric:        Behavior: Behavior normal. Behavior is cooperative.         Thought Content: Thought content normal.        Judgment: Judgment normal.          Patient has been counseled extensively about nutrition and exercise as well as the importance of adherence with medications and regular follow-up. The patient was given clear instructions to go to ER or return to medical center if symptoms don't improve, worsen or new problems develop. The patient verbalized understanding.   Follow-up: Return for HAS TELEVISIT scheduled with me IN [place] .   [name], FNP-BC [facility] and [facility] [place], [place]

## 2020-04-10 LAB — CMP14+EGFR
ALT: 28 IU/L (ref 0–32)
AST: 24 IU/L (ref 0–40)
Albumin/Globulin Ratio: 1.3 (ref 1.2–2.2)
Albumin: 4.3 g/dL (ref 3.8–4.8)
Alkaline Phosphatase: 101 IU/L (ref 44–121)
BUN/Creatinine Ratio: 18 (ref 9–23)
BUN: 18 mg/dL (ref 6–24)
Bilirubin Total: 0.4 mg/dL (ref 0.0–1.2)
CO2: 25 mmol/L (ref 20–29)
Calcium: 9.9 mg/dL (ref 8.7–10.2)
Chloride: 102 mmol/L (ref 96–106)
Creatinine, Ser: 0.99 mg/dL (ref 0.57–1.00)
GFR calc Af Amer: 77 mL/min/{1.73_m2} (ref >59)
GFR calc non Af Amer: 67 mL/min/{1.73_m2} (ref >59)
Globulin, Total: 3.2 g/dL (ref 1.5–4.5)
Glucose: 100 mg/dL — ABNORMAL HIGH (ref 65–99)
Potassium: 4.1 mmol/L (ref 3.5–5.2)
Sodium: 141 mmol/L (ref 134–144)
Total Protein: 7.5 g/dL (ref 6.0–8.5)

## 2020-04-10 NOTE — Progress Notes (Deleted)
NEUROLOGY FOLLOW UP OFFICE NOTE  Brandy Roberts [id number]   Subjective:  Brandy Roberts is a 49 year old right-handed woman with aortic insufficiency, hypertension, hyperlipidemia and history of stroke who follows up for migraines.  UPDATE: Reported increased headaches in June.  She was seen and treated in the ED in July for intractable migraine.   Due to intractable migraine, she was prescribed a prednisone taper ***.  She has had increased depression and anxiety.  She was seen in the ED on 03/01/2020 for psychiatric evaluation.  She was arguing with her son when she made statements about wanting to harm herself.  She has endorsed increased marijuana use.  She was diagnosed with acute stress reaction but denied wanting to harm herself at that time.    Intensity: Moderate Duration: 2 to 3 hours Frequency: 2 days a month Current NSAIDS:Flurbiprofen 100mg  Current analgesics:None Current triptans:None Current ergotamine:None Current anti-emetic:Zofran 4 mg, Reglan 10 mg Current muscle relaxants:None Current anti-anxiolytic:None Current sleep aide:trazodone Current Antihypertensive medications:Amlodipine10mg , hydralazine, Spironolactone, lisinopril, Lopressor Current Antidepressant medications:Venlafaxine XR225mg  Current Anticonvulsant medications: gabapentin 300 mg twice daily Current anti-CGRP:Emgality Current Vitamins/Herbal/Supplements:None Current Antihistamines/Decongestants:None Other therapy:None Hormone/birth control: None  She continues to have falls.  ***  Wrist splints?  Caffeine:Coffee, sweet tea, not daily Alcohol:No Smoker:Cigarettes Diet:Hydrates Exercise:No Depression:Yes; Anxiety:Yes Other pain:Chronic right knee pain Sleep hygiene:Sometimes tosses and turns   HISTORY: MIGRAINES Onset: Migraines since she was young, but worse since her stroke in May 2016. Location: holocephalic Quality: pounding Initial  Intensity: 10/10 Aura: no Prodrome: no Postdrome: no Associated symptoms: Photophobia, phonophobia. Rarely nausea. She has not had any new worse headache of her life, waking up from sleep Initial Duration: 2 days (but Fioricet and ibuprofen lowers intensity down to 2-3/10) Initial Frequency: Once or twice a month Frequency of abortive medication: only as needed Triggers: None Relieving factors: Laying down. Butalbital, ibuprofen. Tramadol helped but she was told not to take it. Activity: aggravates  Past NSAIDS: Ibuprofen, naproxen Past analgesics: Tylenol, Excedrin, Tramadol (effectivebut GI upset), Fioricet Past abortive triptans: no Past muscle relaxants: no Past anti-emetic: no Past antihypertensive medications: no Past antidepressant medications: no Past anticonvulsant medications: topiramate Past vitamins/Herbal/Supplements: no Other past therapies: no  Family history of headache: Sister.  STROKE: She had a stroke in May 2016 with left sided weakness. MRI of brain from 10/06/14 was personally reviewed and revealed small acute lacunar infarcts in the right thalamus and right corona radiata, as well as chronic lacunar infarcts in the left hemisphere. CTA of head and neck revealed diffuse bilateral petrous and cavernous carotid stenosis. Cardiac source of embolus was not discovered.   She was admitted to [facility] from 10/24/16 to 10/26/16 for stroke-like event. She suddenly developed left sided weakness and slurred speech with associated headache. Blood pressure in ED was 120/66. CT of head was personally reviewed and negative for acute abnormality. She had refused tPA. MRI of brain was personally reviewed and revealed no acute stroke or bleed. MRA of head revealed no significant intracranial stenosis or occlusion. LDL was 84. Hgb A1c was 5.7. As per neurology evaluation, her deficits appeared to be non-organic, with unusual speech  pattern and giveaway weakness. Somatization was suspected. 2D echo from 11/06/16 demonstrated normal LV EF of 65-70% with no cardiac source of emboli. Atorvastatin was increased from 10mg  to 80mg  daily. Differential includes TIA vs hemiplegic migraine vs somatization.  SYNCOPE: On 09/21/18, she presented to the ED at [facility] for a 5 day intractable holocephalic throbbing headache.  She reports that when EMS was leading her to the ambulance from her bedroom, she passed out for 20-30 seconds. She described sensation of lightheadedness, like she was going to pass out, as well as tunnel vision and palpitations. She was afebrile. CT of head revealed her old left basal ganglia infarct but no acute abnormality. She was treated with Reglan and Benadryl and headache reportedly resolved. On 09/26/18, she was sitting on her barstool when she felt lightheaded again. She noted palpitations as well. She got up and started walking to her bedroom when she noted tunnel vision and passed out. She woke up on the floor and bruised her back and leg. She thinks she may have been unconsciousness for 20 to 30 minute. There were no witnesses. She noted urinary incontinence but did not bite her tongue. She did not have a headache. She denies change in medications over the past week. She denies fever or illness.Due to episodes of recurrent syncopal events, she underwent workup. MRI of brain with and without contrast on 10/11/18 was personally reviewed and demonstrated stable chronic infarcts but no acute intracranial abnormality. EEG performed on 10/21/18 was normal. No recurrent spells.  FALLS: In 2019, she endorsed increased problems with balance and falls. She stated that they were associated with headache and worsening of her left-sided weakness. MRI of the brain without contrast from 11/13/2017 was personally reviewed and demonstrated no new intracranial abnormalities.  PAST MEDICAL HISTORY: Past Medical  History:  Diagnosis Date  . Anemia   . Anxiety   . Chronic ischemic vertebrobasilar artery thalamic stroke   . Depression   . Hyperlipidemia   . Hypertension   . Migraines   . Nonrheumatic aortic (valve) insufficiency   . Osteoarthritis   . Sleep apnea   . Stroke [facility]) 10/25/2016    MEDICATIONS: Current Outpatient Medications on File Prior to Visit  Medication Sig Dispense Refill  . amLODipine (NORVASC) 10 MG tablet Take 1 tablet (10 mg total) by mouth daily. 90 tablet 1  . atorvastatin (LIPITOR) 80 MG tablet TAKE 1 TABLET BY MOUTH EVERY DAY AT 6PM 90 tablet 1  . clopidogrel (PLAVIX) 75 MG tablet Take 1 tablet (75 mg total) by mouth daily. 90 tablet 3  . Elastic Bandages & Supports (WRIST/THUMB SPLINT/RIGHT MED) MISC 1 each by Does not apply route daily. 1 each 0  . EMGALITY 120 MG/ML SOAJ INJECT 1 ML (120 MG) SUBCUTANEOUSLY EVERY 30 DAYS 1 pen 5  . flurbiprofen (ANSAID) 100 MG tablet TAKE 1 TABLET BY MOUTH EVERY 8 HOURS AS NEEDED (MAX 3 TABS IN 24 HOURS) 60 tablet 0  . gabapentin (NEURONTIN) 300 MG capsule Take 1 capsule (300 mg total) by mouth 2 (two) times daily. 180 capsule 1  . hydrALAZINE (APRESOLINE) 50 MG tablet Take 1 tablet (50 mg total) by mouth 3 (three) times daily. 270 tablet 1  . hydrochlorothiazide (MICROZIDE) 12.5 MG capsule Take 1 capsule (12.5 mg total) by mouth daily. 90 capsule 1  . metoCLOPramide (REGLAN) 10 MG tablet Take 1 tablet (10 mg total) by mouth every 6 (six) hours as needed for nausea (and headache). 30 tablet 2  . metoprolol tartrate (LOPRESSOR) 25 MG tablet Take 1 tablet (25 mg total) by mouth 2 (two) times daily. 180 tablet 1  . Misc. Devices MISC Please provide patient with insurance approved blood pressure monitor. 1 each 0  . Misc. Devices MISC Please provide Left foot AFO M21.372 G81.94 1 each 0  . mupirocin ointment (BACTROBAN) 2 %  Apply 1 application topically 2 (two) times daily. 60 g 1  . nitroGLYCERIN (NITROSTAT) 0.4 MG SL tablet Place 1  tablet (0.4 mg total) under the tongue every 5 (five) minutes as needed for chest pain. 90 tablet 3  . pantoprazole (PROTONIX) 20 MG tablet Take 1 tablet (20 mg total) by mouth daily. 90 tablet 1  . spironolactone (ALDACTONE) 25 MG tablet Take 1 tablet (25 mg total) by mouth daily. 90 tablet 1  . terbinafine (LAMISIL) 250 MG tablet Take 1 tablet (250 mg total) by mouth daily. 42 tablet 1  . traZODone (DESYREL) 100 MG tablet Take 1 tablet (100 mg total) by mouth at bedtime. 90 tablet 1  . venlafaxine XR (EFFEXOR-XR) 75 MG 24 hr capsule TAKE 3 CAPSULES (225 MG) BY MOUTH DAILY 90 capsule 3   No current facility-administered medications on file prior to visit.    ALLERGIES: Allergies  Allergen Reactions  . Ibuprofen     Can not take with current medications  . Tylenol [Acetaminophen] Other (See Comments)    Pt stated tylenol gives her extreme headache    FAMILY HISTORY: Family History  Problem Relation Age of Onset  . Cancer Mother        type unknown  . Hypertension Mother   . Hypertension Sister   . Diabetes Sister   . Cancer Maternal Aunt        4 aunts died of cancer types unknown    SOCIAL HISTORY: Social History   Socioeconomic History  . Marital status: Single    Spouse name: Not on file  . Number of children: 2  . Years of education: 74  . Highest education level: High school graduate  Occupational History  . Occupation: disable   Tobacco Use  . Smoking status: Former Smoker    Packs/day: 0.25    Quit date: 08/31/2017    Years since quitting: 2.6  . Smokeless tobacco: Never Used  Vaping Use  . Vaping Use: Never used  Substance and Sexual Activity  . Alcohol use: No    Alcohol/week: 0.0 standard drinks  . Drug use: Yes    Types: Marijuana  . Sexual activity: Not on file  Other Topics Concern  . Not on file  Social History Narrative   Patient lives with her uncle in a one story home.  Has 2 sons.  Currently on disability.  Education: high school.   Drinks  1-2 sodas a week       Social Determinants of Health   Financial Resource Strain:   . Difficulty of Paying Living Expenses: Not on file  Food Insecurity:   . Worried About Charity fundraiser in the Last Year: Not on file  . Ran Out of Food in the Last Year: Not on file  Transportation Needs:   . Lack of Transportation (Medical): Not on file  . Lack of Transportation (Non-Medical): Not on file  Physical Activity:   . Days of Exercise per Week: Not on file  . Minutes of Exercise per Session: Not on file  Stress:   . Feeling of Stress : Not on file  Social Connections:   . Frequency of Communication with Friends and Family: Not on file  . Frequency of Social Gatherings with Friends and Family: Not on file  . Attends Religious Services: Not on file  . Active Member of Clubs or Organizations: Not on file  . Attends Archivist Meetings: Not on file  . Marital Status: Not  on file  Intimate Partner Violence:   . Fear of Current or Ex-Partner: Not on file  . Emotionally Abused: Not on file  . Physically Abused: Not on file  . Sexually Abused: Not on file     Objective:   There were no vitals filed for this visit. General: No acute distress.  Patient appears well-groomed.   Head:  Normocephalic/atraumatic Eyes:  Fundi examined but not visualized Neck: supple, no paraspinal tenderness, full range of motion Heart:  Regular rate and rhythm Lungs:  Clear to auscultation bilaterally Back: No paraspinal tenderness Neurological Exam: alert and oriented to person, place, and time. Attention span and concentration intact, recent and remote memory intact, fund of knowledge intact.  Speech fluent and not dysarthric, language intact.  CN II-XII intact. Bulk and tone normal, muscle strength 5/5 throughout.  Sensation to light touch, temperature and vibration intact.  Deep tendon reflexes 2+ throughout, toes downgoing.  Finger to nose and heel to shin testing intact.  Gait normal,  Romberg negative.   Assessment/Plan:   1.  Migraine without aura, without status migrainosus, not intractable 2.  Recurrent loss of consciousness.  Semiology characteristic of syncope.  May be due to orthostatic hypotension, vasovagal or cardiac source.  The first event occurred in setting of severe migraine, which may have been vasovagal.  MRI of brain and EEG unremarkable. 3.  Left sided hemiplegia as late effect of stroke 4.  Right hand/thumb pain and numbness.  Possibly carpal tunnel syndrome.  It also may be a tendonitis.  1.  Migraine prophylaxis:  Emgality 2.  Migraine rescue:  Flurbiprofen 100mg  3.  Limit use of pain relievers to no more than 2 days out of week to prevent risk of rebound or medication-overuse headache. 4.  Keep headache diary 5.  Secondary stroke prevention as managed by PCP:  Plavix, statin therapy, blood pressure control 6.  ***  Metta Clines, DO  CC: ***

## 2020-04-11 ENCOUNTER — Ambulatory Visit: Payer: Medicare Other | Admitting: Neurology

## 2020-04-12 ENCOUNTER — Telehealth: Payer: Self-pay | Admitting: Nurse Practitioner

## 2020-04-12 NOTE — Telephone Encounter (Signed)
Copied from [facility] [id number]. Topic: General - Other >> Apr 12, 2020  9:53 AM Yvette Rack wrote: Reason for CRM: Inez Catalina with [facility] stated physicians orders were sent on 03/24/20 and she was calling for an update. Cb# [phone]

## 2020-04-12 NOTE — Telephone Encounter (Signed)
Received the order, will let PCP review it, will fax once completed.

## 2020-04-15 ENCOUNTER — Encounter: Payer: Self-pay | Admitting: Nurse Practitioner

## 2020-04-17 NOTE — Progress Notes (Signed)
NEUROLOGY FOLLOW UP OFFICE NOTE  Brandy Roberts [id number]   Subjective:  Brandy Roberts is a 49 year old right-handed woman with aortic insufficiency, hypertension, hyperlipidemia and history of stroke who follows up for migraines.  UPDATE: Reported increased headaches in June.  She was seen and treated in the ED in July for intractable migraine.   Due to intractable migraine, she was prescribed a prednisone taper.  She has had increased depression and anxiety.  She was seen in the ED on 03/01/2020 for psychiatric evaluation.  She was arguing with her son when she made statements about wanting to harm herself and others.  She has endorsed increased marijuana use.  She was diagnosed with acute stress reaction but denied wanting to harm herself at that time.    Intensity: Moderate Duration: 2 to 3 hours Frequency: 2 days a month Current NSAIDS:Flurbiprofen 100mg  Current analgesics:None Current triptans:None Current ergotamine:None Current anti-emetic:  Reglan 10 mg Current muscle relaxants:None Current anti-anxiolytic:None Current sleep aide:trazodone Current Antihypertensive medications:Amlodipine10mg , hydralazine, Spironolactone, lisinopril, Lopressor Current Antidepressant medications:Venlafaxine XR225mg  Current Anticonvulsant medications: gabapentin 300 mg twice daily Current anti-CGRP:Emgality Current Vitamins/Herbal/Supplements:None Current Antihistamines/Decongestants:None Other therapy:None Hormone/birth control: None   Caffeine:Coffee, sweet tea, not daily Alcohol:No Smoker:Cigarettes Diet:Hydrates Exercise:No Depression:Yes; Anxiety:Yes Other pain:Chronic right knee pain Sleep hygiene:Sometimes tosses and turns   HISTORY: MIGRAINES Onset: Migraines since she was young, but worse since her stroke in May 2016. Location: holocephalic Quality: pounding Initial Intensity: 10/10 Aura: no Prodrome: no Postdrome:  no Associated symptoms: Photophobia, phonophobia. Rarely nausea. She has not had any new worse headache of her life, waking up from sleep Initial Duration: 2 days (but Fioricet and ibuprofen lowers intensity down to 2-3/10) Initial Frequency: Once or twice a month Frequency of abortive medication: only as needed Triggers: None Relieving factors: Laying down. Butalbital, ibuprofen. Tramadol helped but she was told not to take it. Activity: aggravates  Past NSAIDS: Ibuprofen, naproxen Past analgesics: Tylenol, Excedrin, Tramadol (effectivebut GI upset), Fioricet Past abortive triptans: no Past muscle relaxants: no Past anti-emetic: Zofran Past antihypertensive medications: no Past antidepressant medications: no Past anticonvulsant medications: topiramate Past vitamins/Herbal/Supplements: no Other past therapies: no  Family history of headache: Sister.  STROKE: She had a stroke in May 2016 with left sided weakness. MRI of brain from 10/06/14 was personally reviewed and revealed small acute lacunar infarcts in the right thalamus and right corona radiata, as well as chronic lacunar infarcts in the left hemisphere. CTA of head and neck revealed diffuse bilateral petrous and cavernous carotid stenosis. Cardiac source of embolus was not discovered.   She was admitted to [facility] from 10/24/16 to 10/26/16 for stroke-like event. She suddenly developed left sided weakness and slurred speech with associated headache. Blood pressure in ED was 120/66. CT of head was personally reviewed and negative for acute abnormality. She had refused tPA. MRI of brain was personally reviewed and revealed no acute stroke or bleed. MRA of head revealed no significant intracranial stenosis or occlusion. LDL was 84. Hgb A1c was 5.7. As per neurology evaluation, her deficits appeared to be non-organic, with unusual speech pattern and giveaway weakness. Somatization was  suspected. 2D echo from 11/06/16 demonstrated normal LV EF of 65-70% with no cardiac source of emboli. Atorvastatin was increased from 10mg  to 80mg  daily. Differential includes TIA vs hemiplegic migraine vs somatization.  SYNCOPE: On 09/21/18, she presented to the ED at [facility] for a 5 day intractable holocephalic throbbing headache. She reports that when EMS was leading her to the  ambulance from her bedroom, she passed out for 20-30 seconds. She described sensation of lightheadedness, like she was going to pass out, as well as tunnel vision and palpitations. She was afebrile. CT of head revealed her old left basal ganglia infarct but no acute abnormality. She was treated with Reglan and Benadryl and headache reportedly resolved. On 09/26/18, she was sitting on her barstool when she felt lightheaded again. She noted palpitations as well. She got up and started walking to her bedroom when she noted tunnel vision and passed out. She woke up on the floor and bruised her back and leg. She thinks she may have been unconsciousness for 20 to 30 minute. There were no witnesses. She noted urinary incontinence but did not bite her tongue. She did not have a headache. She denies change in medications over the past week. She denies fever or illness.Due to episodes of recurrent syncopal events, she underwent workup. MRI of brain with and without contrast on 10/11/18 was personally reviewed and demonstrated stable chronic infarcts but no acute intracranial abnormality. EEG performed on 10/21/18 was normal. No recurrent spells.  FALLS: In 2019, she endorsed increased problems with balance and falls. She stated that they were associated with headache and worsening of her left-sided weakness. MRI of the brain without contrast from 11/13/2017 was personally reviewed and demonstrated no new intracranial abnormalities.  PAST MEDICAL HISTORY: Past Medical History:  Diagnosis Date  . Anemia   . Anxiety    . Chronic ischemic vertebrobasilar artery thalamic stroke   . Depression   . Hyperlipidemia   . Hypertension   . Migraines   . Nonrheumatic aortic (valve) insufficiency   . Osteoarthritis   . Sleep apnea   . Stroke [facility]) 10/25/2016    MEDICATIONS: Current Outpatient Medications on File Prior to Visit  Medication Sig Dispense Refill  . amLODipine (NORVASC) 10 MG tablet Take 1 tablet (10 mg total) by mouth daily. 90 tablet 1  . atorvastatin (LIPITOR) 80 MG tablet TAKE 1 TABLET BY MOUTH EVERY DAY AT 6PM 90 tablet 1  . clopidogrel (PLAVIX) 75 MG tablet Take 1 tablet (75 mg total) by mouth daily. 90 tablet 3  . Elastic Bandages & Supports (WRIST/THUMB SPLINT/RIGHT MED) MISC 1 each by Does not apply route daily. 1 each 0  . EMGALITY 120 MG/ML SOAJ INJECT 1 ML (120 MG) SUBCUTANEOUSLY EVERY 30 DAYS 1 pen 5  . flurbiprofen (ANSAID) 100 MG tablet TAKE 1 TABLET BY MOUTH EVERY 8 HOURS AS NEEDED (MAX 3 TABS IN 24 HOURS) 60 tablet 0  . gabapentin (NEURONTIN) 300 MG capsule Take 1 capsule (300 mg total) by mouth 2 (two) times daily. 180 capsule 1  . hydrALAZINE (APRESOLINE) 50 MG tablet Take 1 tablet (50 mg total) by mouth 3 (three) times daily. 270 tablet 1  . hydrochlorothiazide (MICROZIDE) 12.5 MG capsule Take 1 capsule (12.5 mg total) by mouth daily. 90 capsule 1  . metoCLOPramide (REGLAN) 10 MG tablet Take 1 tablet (10 mg total) by mouth every 6 (six) hours as needed for nausea (and headache). 30 tablet 2  . metoprolol tartrate (LOPRESSOR) 25 MG tablet Take 1 tablet (25 mg total) by mouth 2 (two) times daily. 180 tablet 1  . Misc. Devices MISC Please provide patient with insurance approved blood pressure monitor. 1 each 0  . Misc. Devices MISC Please provide Left foot AFO M21.372 G81.94 1 each 0  . mupirocin ointment (BACTROBAN) 2 % Apply 1 application topically 2 (two) times daily. 60 g  1  . nitroGLYCERIN (NITROSTAT) 0.4 MG SL tablet Place 1 tablet (0.4 mg total) under the tongue every 5 (five)  minutes as needed for chest pain. 90 tablet 3  . pantoprazole (PROTONIX) 20 MG tablet Take 1 tablet (20 mg total) by mouth daily. 90 tablet 1  . spironolactone (ALDACTONE) 25 MG tablet Take 1 tablet (25 mg total) by mouth daily. 90 tablet 1  . terbinafine (LAMISIL) 250 MG tablet Take 1 tablet (250 mg total) by mouth daily. 42 tablet 1  . traZODone (DESYREL) 100 MG tablet Take 1 tablet (100 mg total) by mouth at bedtime. 90 tablet 1  . venlafaxine XR (EFFEXOR-XR) 75 MG 24 hr capsule TAKE 3 CAPSULES (225 MG) BY MOUTH DAILY 90 capsule 3   No current facility-administered medications on file prior to visit.    ALLERGIES: Allergies  Allergen Reactions  . Ibuprofen     Can not take with current medications  . Tylenol [Acetaminophen] Other (See Comments)    Pt stated tylenol gives her extreme headache    FAMILY HISTORY: Family History  Problem Relation Age of Onset  . Cancer Mother        type unknown  . Hypertension Mother   . Hypertension Sister   . Diabetes Sister   . Cancer Maternal Aunt        4 aunts died of cancer types unknown    SOCIAL HISTORY: Social History   Socioeconomic History  . Marital status: Single    Spouse name: Not on file  . Number of children: 2  . Years of education: 78  . Highest education level: High school graduate  Occupational History  . Occupation: disable   Tobacco Use  . Smoking status: Former Smoker    Packs/day: 0.25    Quit date: 08/31/2017    Years since quitting: 2.6  . Smokeless tobacco: Never Used  Vaping Use  . Vaping Use: Never used  Substance and Sexual Activity  . Alcohol use: No    Alcohol/week: 0.0 standard drinks  . Drug use: Yes    Types: Marijuana  . Sexual activity: Not on file  Other Topics Concern  . Not on file  Social History Narrative   Patient lives with her uncle in a one story home.  Has 2 sons.  Currently on disability.  Education: high school.   Drinks 1-2 sodas a week       Social Determinants of Health    Financial Resource Strain:   . Difficulty of Paying Living Expenses: Not on file  Food Insecurity:   . Worried About Charity fundraiser in the Last Year: Not on file  . Ran Out of Food in the Last Year: Not on file  Transportation Needs:   . Lack of Transportation (Medical): Not on file  . Lack of Transportation (Non-Medical): Not on file  Physical Activity:   . Days of Exercise per Week: Not on file  . Minutes of Exercise per Session: Not on file  Stress:   . Feeling of Stress : Not on file  Social Connections:   . Frequency of Communication with Friends and Family: Not on file  . Frequency of Social Gatherings with Friends and Family: Not on file  . Attends Religious Services: Not on file  . Active Member of Clubs or Organizations: Not on file  . Attends Archivist Meetings: Not on file  . Marital Status: Not on file  Intimate Partner Violence:   . Fear  of Current or Ex-Partner: Not on file  . Emotionally Abused: Not on file  . Physically Abused: Not on file  . Sexually Abused: Not on file     Objective:   Blood pressure 129/70, pulse (!) 57, height 5\' 6"  (1.676 m), weight 199 lb (90.3 kg), SpO2 98 %. General: No acute distress.  Patient appears well-groomed.      Assessment/Plan:   1.  Migraine without aura, without status migrainosus, not intractable 2.  Recurrent loss of consciousness.  Semiology characteristic of syncope.  May be due to orthostatic hypotension, vasovagal or cardiac source.  The first event occurred in setting of severe migraine, which may have been vasovagal.  MRI of brain and EEG unremarkable.   1.  Migraine prophylaxis:  Emgality 2.  Migraine rescue:  Flurbiprofen 100mg  3.  Limit use of pain relievers to no more than 2 days out of week to prevent risk of rebound or medication-overuse headache. 4.  Keep headache diary 5.  Follow up with psychiatry.  They may alter/discontinue venlafaxine if necessary. 6.  Follow up in 9  months.  Metta Clines, DO  CC: [name], NP

## 2020-04-18 ENCOUNTER — Other Ambulatory Visit: Payer: Self-pay

## 2020-04-18 ENCOUNTER — Ambulatory Visit (INDEPENDENT_AMBULATORY_CARE_PROVIDER_SITE_OTHER): Payer: Medicare Other | Admitting: Neurology

## 2020-04-18 ENCOUNTER — Encounter: Payer: Self-pay | Admitting: Neurology

## 2020-04-18 VITALS — BP 129/70 | HR 57 | Ht 66.0 in | Wt 199.0 lb

## 2020-04-18 DIAGNOSIS — G43009 Migraine without aura, not intractable, without status migrainosus: Secondary | ICD-10-CM | POA: Diagnosis not present

## 2020-04-18 NOTE — Patient Instructions (Addendum)
1.  Continue Emgality monthly 2.  Use flurbiprofen as directed for when you have a migraine 3.  Follow up with psychiatry.  If they think you should be taken off of venlafaxine, then that is okay with me. 4.  Follow up in 9 months.

## 2020-04-24 ENCOUNTER — Encounter: Payer: Self-pay | Admitting: Podiatrist

## 2020-04-24 ENCOUNTER — Ambulatory Visit (INDEPENDENT_AMBULATORY_CARE_PROVIDER_SITE_OTHER): Payer: Medicare Other

## 2020-04-24 ENCOUNTER — Ambulatory Visit (INDEPENDENT_AMBULATORY_CARE_PROVIDER_SITE_OTHER): Payer: Medicare Other | Admitting: Podiatrist

## 2020-04-24 ENCOUNTER — Other Ambulatory Visit: Payer: Self-pay | Admitting: Podiatrist

## 2020-04-24 ENCOUNTER — Telehealth: Payer: Self-pay | Admitting: Nurse Practitioner

## 2020-04-24 ENCOUNTER — Other Ambulatory Visit: Payer: Self-pay

## 2020-04-24 VITALS — BP 136/71 | HR 61

## 2020-04-24 DIAGNOSIS — S99921A Unspecified injury of right foot, initial encounter: Secondary | ICD-10-CM | POA: Diagnosis not present

## 2020-04-24 DIAGNOSIS — S90121A Contusion of right lesser toe(s) without damage to nail, initial encounter: Secondary | ICD-10-CM

## 2020-04-24 DIAGNOSIS — S93509A Unspecified sprain of unspecified toe(s), initial encounter: Secondary | ICD-10-CM

## 2020-04-24 NOTE — Progress Notes (Signed)
Chief Complaint  Patient presents with  . Toe Pain    right foot, 2nd toe was painful since 1 week , could barely walk on it, now only painful w/ touch     HPI: Patient is 49 y.o. female who presents today for the concerns as listed above. She relates no obvious injury she can recall.  She states the toe is getting better since a week ago.     Patient Active Problem List   Diagnosis Date Noted  . Palpitations 10/12/2018  . Chest discomfort 10/12/2018  . Syncope and collapse 10/07/2018  . Migraine 05/21/2018  . Snoring 05/02/2015  . Essential hypertension 05/02/2015  . Headache 05/01/2015  . Former smoker 01/24/2015  . Left hemiparesis ([place]) 10/23/2014  . Cardiomyopathy due to hypertension ([place]) 10/23/2014  . Chronic ischemic vertebrobasilar artery thalamic stroke   . Hypertensive heart disease   . Overweight (BMI 25.0-29.9)   . H/O noncompliance with medical treatment, presenting hazards to health   . Aortic regurgitation   . History of ischemic middle cerebral artery stroke embolic   . Hyperlipidemia     Current Outpatient Medications on File Prior to Visit  Medication Sig Dispense Refill  . amLODipine (NORVASC) 10 MG tablet Take 1 tablet (10 mg total) by mouth daily. 90 tablet 1  . atorvastatin (LIPITOR) 80 MG tablet TAKE 1 TABLET BY MOUTH EVERY DAY AT 6PM 90 tablet 1  . clopidogrel (PLAVIX) 75 MG tablet Take 1 tablet (75 mg total) by mouth daily. 90 tablet 3  . Elastic Bandages & Supports (WRIST/THUMB SPLINT/RIGHT MED) MISC 1 each by Does not apply route daily. 1 each 0  . EMGALITY 120 MG/ML SOAJ INJECT 1 ML (120 MG) SUBCUTANEOUSLY EVERY 30 DAYS 1 pen 5  . FLUoxetine (PROZAC) 10 MG tablet Take 1/2 tablet by mouth every morning for 1 week. Then take 1 tablet by morning  every morning    . flurbiprofen (ANSAID) 100 MG tablet TAKE 1 TABLET BY MOUTH EVERY 8 HOURS AS NEEDED (MAX 3 TABS IN 24 HOURS) 60 tablet 0  . gabapentin (NEURONTIN) 300 MG capsule Take 1 capsule (300 mg  total) by mouth 2 (two) times daily. 180 capsule 1  . hydrALAZINE (APRESOLINE) 50 MG tablet Take 1 tablet (50 mg total) by mouth 3 (three) times daily. 270 tablet 1  . hydrochlorothiazide (MICROZIDE) 12.5 MG capsule Take 1 capsule (12.5 mg total) by mouth daily. 90 capsule 1  . hydrOXYzine (VISTARIL) 50 MG capsule take 1 capsule by mouth up to three times every day as needed for anxiety or sleep.    [name] lisinopril (ZESTRIL) 10 MG tablet Take by mouth.    . metoCLOPramide (REGLAN) 10 MG tablet Take 1 tablet (10 mg total) by mouth every 6 (six) hours as needed for nausea (and headache). 30 tablet 2  . metoprolol tartrate (LOPRESSOR) 25 MG tablet Take 1 tablet (25 mg total) by mouth 2 (two) times daily. 180 tablet 1  . Misc. Devices MISC Please provide patient with insurance approved blood pressure monitor. 1 each 0  . Misc. Devices MISC Please provide Left foot AFO M21.372 G81.94 1 each 0  . mupirocin ointment (BACTROBAN) 2 % Apply 1 application topically 2 (two) times daily. 60 g 1  . nitroGLYCERIN (NITROSTAT) 0.4 MG SL tablet Place 1 tablet (0.4 mg total) under the tongue every 5 (five) minutes as needed for chest pain. 90 tablet 3  . pantoprazole (PROTONIX) 20 MG tablet Take 1 tablet (20 mg total)  by mouth daily. 90 tablet 1  . spironolactone (ALDACTONE) 25 MG tablet Take 1 tablet (25 mg total) by mouth daily. 90 tablet 1  . terbinafine (LAMISIL) 250 MG tablet Take 1 tablet (250 mg total) by mouth daily. 42 tablet 1  . traZODone (DESYREL) 100 MG tablet Take 1 tablet (100 mg total) by mouth at bedtime. 90 tablet 1  . venlafaxine XR (EFFEXOR-XR) 75 MG 24 hr capsule TAKE 3 CAPSULES (225 MG) BY MOUTH DAILY 90 capsule 3   No current facility-administered medications on file prior to visit.    Allergies  Allergen Reactions  . Ibuprofen Other (See Comments)    Can not take with current medications .  [name] Tylenol [Acetaminophen] Other (See Comments)    Pt stated tylenol gives her extreme headache     Review of Systems No fevers, chills, nausea, muscle aches, no difficulty breathing, no calf pain, no chest pain or shortness of breath.   Physical Exam  GENERAL APPEARANCE: Alert, conversant. Appropriately groomed. No acute distress.   VASCULAR: Pedal pulses palpable DP and PT bilateral.  Capillary refill time is immediate to all digits,  Proximal to distal cooling it warm to warm.  Digital perfusion adequate.   NEUROLOGIC: sensation is intact to 5.07 monofilament at 5/5 sites bilateral.  Light touch is intact bilateral, vibratory sensation intact bilateral  MUSCULOSKELETAL: acceptable muscle strength, tone and stability bilateral.  No gross boney pedal deformities noted.  No pain, crepitus or limitation noted with foot and ankle range of motion bilateral.  Mild contracture at the distal interphalangeal joint of the right second toe.  Some discomfort with dorsiflexion and plantarflexion noted at this joint.  Pain at the tip of the toe is also noted with some slight swelling seen.    DERMATOLOGIC: skin is warm, supple, and dry.  No open lesions noted.  No rash, no pre ulcerative lesions. Digital nails are mycotic and dystrophic as well.     Radiographic exam:  Normal osseous mineralization.  No fracture or dislocation or acute osseous abnormalities present.  Joint spaces are normal.      Assessment     ICD-10-CM   1. Injury of toe on right foot, initial encounter  W86.168H CANCELED: DG Foot Complete Right     Plan  Discussed exam and xray findings-  Discussed this could be from an irritation around the toenail or it could be a sprain of her joint most likely caused by shoes or a small mis-step.  Recommended soaking in epsom salt soaks for the next 2 weeks and should the toe continue to be painful she will call .  Otherwise it should resolve on its own.

## 2020-04-24 NOTE — Telephone Encounter (Signed)
Brittiany with [facility] is calling to check on the status of a request for a medical necessity form and physician order for incontince supplies. Order was faxed on 03/30/20 -CB- [phone] -phone, [phone]- Fax

## 2020-04-24 NOTE — Patient Instructions (Signed)
Soak Instructions      Place 1/4 cup of epsom salts in a quart of warm tap water.  continue to soak in the solution for 20 minutes.  This soak should be done twice a day.    Apply vaseline to the tip of the left great toe for a couple weeks to soften the skin.

## 2020-05-03 ENCOUNTER — Other Ambulatory Visit: Payer: Self-pay | Admitting: Nurse Practitioner

## 2020-05-03 DIAGNOSIS — I639 Cerebral infarction, unspecified: Secondary | ICD-10-CM | POA: Diagnosis not present

## 2020-05-04 ENCOUNTER — Other Ambulatory Visit: Payer: Self-pay | Admitting: Nurse Practitioner

## 2020-05-04 NOTE — Telephone Encounter (Signed)
Requested medication (s) are due for refill today: yes  Requested medication (s) are on the active medication list:yes  Last refill: 04/10/20  Future visit scheduled: yes  Notes to clinic:  Historic provider, on active med list    Requested Prescriptions  Pending Prescriptions Disp Refills   hydrOXYzine (VISTARIL) 50 MG capsule [Pharmacy Med Name: HYDROXYZINE PAMOATE 50 MG CAP] 90 capsule     Sig: TAKE 1 CAPSULE BY MOUTH UP TO 3 TIMES PER DAY AS NEEDED FOR ANXIETY OR SLEEP      Ear, Nose, and Throat:  Antihistamines Passed - 05/04/2020  8:58 AM      Passed - Valid encounter within last 12 months    Recent Outpatient Visits           3 weeks ago Onychomycosis   [place], [name], NP   1 month ago Hospital discharge follow-up   [place], [place], NP   2 months ago Essential hypertension   [place], [name], NP   6 months ago Essential hypertension   [place], [name], NP   9 months ago Low serum thyroid stimulating hormone (TSH)   [place], [name], NP       Future Appointments             In 2 weeks [name], NP [place]   In 3 months Revankar, Reita Cliche, MD [facility]

## 2020-05-17 ENCOUNTER — Telehealth: Payer: Self-pay | Admitting: Nurse Practitioner

## 2020-05-17 ENCOUNTER — Other Ambulatory Visit: Payer: Self-pay | Admitting: Nurse Practitioner

## 2020-05-17 ENCOUNTER — Other Ambulatory Visit: Payer: Self-pay | Admitting: Neurology

## 2020-05-17 DIAGNOSIS — K2100 Gastro-esophageal reflux disease with esophagitis, without bleeding: Secondary | ICD-10-CM

## 2020-05-17 DIAGNOSIS — I10 Essential (primary) hypertension: Secondary | ICD-10-CM

## 2020-05-17 NOTE — Telephone Encounter (Signed)
Requested Prescriptions  Pending Prescriptions Disp Refills   metoCLOPramide (REGLAN) 10 MG tablet [Pharmacy Med Name: METOCLOPRAMIDE HCL 10 MG TAB] 30 tablet 2    Sig: TAKE 1 TABLET BY MOUTH EVERY 6 HOURS AS NEEDED FOR NAUSEA/HEADACHE     Not Delegated - Gastroenterology: Antiemetics Failed - 05/17/2020 10:47 AM      Failed - This refill cannot be delegated      Passed - Valid encounter within last 6 months    Recent Outpatient Visits          1 month ago Onychomycosis   [place], [name], NP   1 month ago Hospital discharge follow-up   [place], [place] J, NP   2 months ago Essential hypertension   [place], [name], NP   6 months ago Essential hypertension   [place], [name], NP   10 months ago Low serum thyroid stimulating hormone (TSH)   [place], [name], NP      Future Appointments            In 4 days [name], NP [place]   In 3 months Revankar, Reita Cliche, MD [facility] [place]            spironolactone (ALDACTONE) 25 MG tablet [Pharmacy Med Name: SPIRONOLACTONE 25 MG TAB] 90 tablet 0    Sig: TAKE 1 TABLET BY MOUTH EACH DAY     Cardiovascular: Diuretics - Aldosterone Antagonist Passed - 05/17/2020 10:47 AM      Passed - Cr in normal range and within 360 days    Creat  Date Value Ref Range Status  07/24/2016 1.01 0.50 - 1.10 mg/dL Final   Creatinine, Ser  Date Value Ref Range Status  04/09/2020 0.99 0.57 - 1.00 mg/dL Final         Passed - K in normal range and within 360 days    Potassium  Date Value Ref Range Status  04/09/2020 4.1 3.5 - 5.2 mmol/L Final         Passed - Na in normal range and within 360 days    Sodium  Date Value Ref Range Status  04/09/2020 141 134 - 144 mmol/L Final         Passed  - Last BP in normal range    BP Readings from Last 1 Encounters:  04/24/20 136/71         Passed - Valid encounter within last 6 months    Recent Outpatient Visits          1 month ago Onychomycosis   [place], [name], NP   1 month ago Hospital discharge follow-up   [place], [place], NP   2 months ago Essential hypertension   [place], [name], NP   6 months ago Essential hypertension   [place], [name], NP   10 months ago Low serum thyroid stimulating hormone (TSH)   [place] [name], NP      Future Appointments            In 4 days [name], NP [place]   In  3 months Revankar, Reita Cliche, MD [facility]

## 2020-05-17 NOTE — Telephone Encounter (Signed)
Call placed to pt., after receiving an alert when refilling Spironolactone.  The alert indicated that pt. Should not be taking Lisinopril and Spironolactone concurrently.  Pt. Stated she is not taking Lisinopril.  Reported it made her sick to her stomach and she is no longer on it.  Advised to continue taking the Spironolactone as recommended by her PCP.  Pt. Verb. Understanding.

## 2020-05-17 NOTE — Telephone Encounter (Signed)
Requested medication (s) are due for refill today:  Yes  Requested medication (s) are on the active medication list:  Yes  Future visit scheduled:  Yes  Last Refill: 02/20/20; # 30; RF x 2  Notes to clinic: medication is not delegated  Requested Prescriptions  Pending Prescriptions Disp Refills   metoCLOPramide (REGLAN) 10 MG tablet [Pharmacy Med Name: METOCLOPRAMIDE HCL 10 MG TAB] 30 tablet 2    Sig: TAKE 1 TABLET BY MOUTH EVERY 6 HOURS AS NEEDED FOR NAUSEA/HEADACHE      Not Delegated - Gastroenterology: Antiemetics Failed - 05/17/2020 10:47 AM      Failed - This refill cannot be delegated      Passed - Valid encounter within last 6 months    Recent Outpatient Visits           1 month ago Onychomycosis   [place], [name], NP   1 month ago Hospital discharge follow-up   [place], [place] J, NP   2 months ago Essential hypertension   [place], [name], NP   6 months ago Essential hypertension   [place], [name], NP   10 months ago Low serum thyroid stimulating hormone (TSH)   [place], [name], NP       Future Appointments             In 4 days [name], NP [place]   In 3 months Revankar, Reita Cliche, MD [facility] [place]              Signed Prescriptions Disp Refills   spironolactone (ALDACTONE) 25 MG tablet 90 tablet 0    Sig: TAKE 1 TABLET BY MOUTH EACH DAY      Cardiovascular: Diuretics - Aldosterone Antagonist Passed - 05/17/2020 10:47 AM      Passed - Cr in normal range and within 360 days    Creat  Date Value Ref Range Status  07/24/2016 1.01 0.50 - 1.10 mg/dL Final   Creatinine, Ser  Date Value Ref Range Status  04/09/2020 0.99 0.57 - 1.00 mg/dL Final          Passed - K in normal range and within 360  days    Potassium  Date Value Ref Range Status  04/09/2020 4.1 3.5 - 5.2 mmol/L Final          Passed - Na in normal range and within 360 days    Sodium  Date Value Ref Range Status  04/09/2020 141 134 - 144 mmol/L Final          Passed - Last BP in normal range    BP Readings from Last 1 Encounters:  04/24/20 136/71          Passed - Valid encounter within last 6 months    Recent Outpatient Visits           1 month ago Onychomycosis   [place], [name], NP   1 month ago Hospital discharge follow-up   [place], [place], NP   2 months ago Essential hypertension   [place], [name], NP   6 months ago Essential hypertension   [place], [name], NP  10 months ago Low serum thyroid stimulating hormone (TSH)   [place], [name], NP       Future Appointments             In 4 days [name], NP [place]   In 3 months Revankar, Reita Cliche, MD [facility]

## 2020-05-17 NOTE — Telephone Encounter (Signed)
Copied from [place] [id number]. Topic: General - Other >> May 16, 2020 10:54 AM Rain[place] A wrote: Homecare delivery is requesting a callback with a status update on the certificat of medical necessity that was faxed over . Best contact [phone]

## 2020-05-21 ENCOUNTER — Other Ambulatory Visit: Payer: Self-pay

## 2020-05-21 ENCOUNTER — Ambulatory Visit: Payer: Medicare Other | Attending: Nurse Practitioner | Admitting: Nurse Practitioner

## 2020-05-21 ENCOUNTER — Encounter: Payer: Self-pay | Admitting: Nurse Practitioner

## 2020-05-21 VITALS — BP 114/66 | HR 63 | Temp 98.1°F | Ht 66.0 in | Wt 202.8 lb

## 2020-05-21 DIAGNOSIS — L089 Local infection of the skin and subcutaneous tissue, unspecified: Secondary | ICD-10-CM

## 2020-05-21 DIAGNOSIS — I10 Essential (primary) hypertension: Secondary | ICD-10-CM | POA: Diagnosis not present

## 2020-05-21 DIAGNOSIS — R7303 Prediabetes: Secondary | ICD-10-CM

## 2020-05-21 DIAGNOSIS — B9689 Other specified bacterial agents as the cause of diseases classified elsewhere: Secondary | ICD-10-CM

## 2020-05-21 LAB — GLUCOSE, POCT (MANUAL RESULT ENTRY): POC Glucose: 136 mg/dl — AB (ref 70–99)

## 2020-05-21 MED ORDER — MUPIROCIN 2 % EX OINT: 1.0000 "application " | TOPICAL_OINTMENT | Freq: Two times a day (BID) | CUTANEOUS | 3 refills | Status: DC

## 2020-05-21 MED ORDER — BACTROBAN NASAL 2 % NA OINT: TOPICAL_OINTMENT | NASAL | 3 refills | Status: DC

## 2020-05-21 NOTE — Progress Notes (Signed)
Assessment & Plan:  Brandy Roberts was seen today for follow-up.  Diagnoses and all orders for this visit:  Essential hypertension Continue all antihypertensives as prescribed.  Remember to bring in your blood pressure log with you for your follow up appointment.  DASH/Mediterranean Diets are healthier choices for HTN.    Prediabetes -     Cancel: Glucose (CBG) -     Glucose (CBG)  Bacterial skin infection -     mupirocin ointment (BACTROBAN) 2 %; Apply 1 application topically 2 (two) times daily.    Patient has been counseled on age-appropriate routine health concerns for screening and prevention. These are reviewed and up-to-date. Referrals have been placed accordingly. Immunizations are up-to-date or declined.    Subjective:   Chief Complaint  Patient presents with   Follow-up    Pt. Is here for blood pressure check.    HPI Brandy Roberts 49 y.o. female presents to office today for HTN   Essential Hypertension Blood pressure is well controlled. Denies chest pain, shortness of breath, palpitations, lightheadedness, dizziness, headaches or BLE edema. She is currently taking amlodipine 10 mg daily hydralazine 50 mg 3 times daily, hydrochlorothiazide 12.5 mg daily and Lopressor 25 mg twice daily. BP Readings from Last 3 Encounters:  05/21/20 114/66  04/24/20 136/71  04/18/20 129/70    Endorses persistent myoclonus of right arm. This has caused her to drop objects inadvertently. I have instructed her to speak with Dr. Tomi Likens regarding this to rule out seizure disorder. Also I do not prescribe epileptics which are recommended as treatment for this.     Review of Systems  Constitutional: Negative for fever, malaise/fatigue and weight loss.  HENT: Negative.  Negative for nosebleeds.   Eyes: Negative.  Negative for blurred vision, double vision and photophobia.  Respiratory: Negative.  Negative for cough and shortness of breath.   Cardiovascular: Negative.  Negative for  chest pain, palpitations and leg swelling.  Gastrointestinal: Negative.  Negative for heartburn, nausea and vomiting.  Musculoskeletal: Negative.  Negative for myalgias.  Skin: Positive for rash (buttocks).  Neurological: Positive for sensory change. Negative for dizziness, focal weakness, seizures and headaches.  Psychiatric/Behavioral: Positive for depression. Negative for suicidal ideas.    Past Medical History:  Diagnosis Date   Anemia    Anxiety    Chronic ischemic vertebrobasilar artery thalamic stroke    Depression    Hyperlipidemia    Hypertension    Migraines    Nonrheumatic aortic (valve) insufficiency    Osteoarthritis    Sleep apnea    Stroke ([place]) 10/25/2016    Past Surgical History:  Procedure Laterality Date   CESAREAN SECTION     GANGLION CYST EXCISION     TEE WITHOUT CARDIOVERSION N/A 10/09/2014   Procedure: TRANSESOPHAGEAL ECHOCARDIOGRAM (TEE);  Surgeon: Sanda Klein, MD;  Location: [facility] ENDOSCOPY;  Service: Cardiovascular;  Laterality: N/A;   TONSILLECTOMY      Family History  Problem Relation Age of Onset   Cancer Mother        type unknown   Hypertension Mother    Hypertension Sister    Diabetes Sister    Cancer Maternal Aunt        4 aunts died of cancer types unknown    Social History Reviewed with no changes to be made today.   Outpatient Medications Prior to Visit  Medication Sig Dispense Refill   amLODipine (NORVASC) 10 MG tablet Take 1 tablet (10 mg total) by mouth daily. 90 tablet 1  atorvastatin (LIPITOR) 80 MG tablet TAKE 1 TABLET BY MOUTH EVERY DAY AT 6PM 90 tablet 1   clopidogrel (PLAVIX) 75 MG tablet Take 1 tablet (75 mg total) by mouth daily. 90 tablet 3   Elastic Bandages & Supports (WRIST/THUMB SPLINT/RIGHT MED) MISC 1 each by Does not apply route daily. 1 each 0   EMGALITY 120 MG/ML SOAJ INJECT 1 ML (120 MG) SUBCUTANEOUSLY EVERY 30 DAYS 1 mL 7   FLUoxetine (PROZAC) 10 MG tablet Take 1/2 tablet by mouth  every morning for 1 week. Then take 1 tablet by morning  every morning     flurbiprofen (ANSAID) 100 MG tablet TAKE 1 TABLET BY MOUTH EVERY 8 HOURS AS NEEDED (MAX 3 TABS IN 24 HOURS) 60 tablet 0   gabapentin (NEURONTIN) 300 MG capsule Take 1 capsule (300 mg total) by mouth 2 (two) times daily. 180 capsule 1   hydrOXYzine (VISTARIL) 50 MG capsule TAKE 1 CAPSULE BY MOUTH UP TO 3 TIMES PER DAY AS NEEDED FOR ANXIETY OR SLEEP 90 capsule 0   lisinopril (ZESTRIL) 10 MG tablet Take by mouth.     metoCLOPramide (REGLAN) 10 MG tablet TAKE 1 TABLET BY MOUTH EVERY 6 HOURS AS NEEDED FOR NAUSEA/HEADACHE 30 tablet 1   Misc. Devices MISC Please provide patient with insurance approved blood pressure monitor. 1 each 0   Misc. Devices MISC Please provide Left foot AFO M21.372 G81.94 1 each 0   pantoprazole (PROTONIX) 20 MG tablet Take 1 tablet (20 mg total) by mouth daily. 90 tablet 1   spironolactone (ALDACTONE) 25 MG tablet TAKE 1 TABLET BY MOUTH EACH DAY 90 tablet 0   terbinafine (LAMISIL) 250 MG tablet Take 1 tablet (250 mg total) by mouth daily. 42 tablet 1   traZODone (DESYREL) 100 MG tablet Take 1 tablet (100 mg total) by mouth at bedtime. 90 tablet 1   venlafaxine XR (EFFEXOR-XR) 75 MG 24 hr capsule TAKE 3 CAPSULES (225 MG) BY MOUTH DAILY 90 capsule 3   mupirocin ointment (BACTROBAN) 2 % Apply 1 application topically 2 (two) times daily. 60 g 1   hydrALAZINE (APRESOLINE) 50 MG tablet Take 1 tablet (50 mg total) by mouth 3 (three) times daily. 270 tablet 1   hydrochlorothiazide (MICROZIDE) 12.5 MG capsule Take 1 capsule (12.5 mg total) by mouth daily. 90 capsule 1   metoprolol tartrate (LOPRESSOR) 25 MG tablet Take 1 tablet (25 mg total) by mouth 2 (two) times daily. 180 tablet 1   nitroGLYCERIN (NITROSTAT) 0.4 MG SL tablet Place 1 tablet (0.4 mg total) under the tongue every 5 (five) minutes as needed for chest pain. 90 tablet 3   No facility-administered medications prior to visit.     Allergies  Allergen Reactions   Ibuprofen Other (See Comments)    Can not take with current medications .   Tylenol [Acetaminophen] Other (See Comments)    Pt stated tylenol gives her extreme headache       Objective:    BP 114/66 (BP Location: Right Arm, Patient Position: Sitting, Cuff Size: Large)    Pulse 63    Temp 98.1 F (36.7 C) (Oral)    Ht 5\' 6"  (1.676 m)    Wt 202 lb 12.8 oz (92 kg)    SpO2 99%    BMI 32.73 kg/m  Wt Readings from Last 3 Encounters:  05/21/20 202 lb 12.8 oz (92 kg)  04/18/20 199 lb (90.3 kg)  04/09/20 191 lb (86.6 kg)    Physical Exam Vitals and  nursing note reviewed.  Constitutional:      Appearance: She is well-developed and well-nourished.  HENT:     Head: Normocephalic and atraumatic.  Eyes:     Extraocular Movements: EOM normal.  Cardiovascular:     Rate and Rhythm: Normal rate and regular rhythm.     Pulses: Intact distal pulses.     Heart sounds: Normal heart sounds. No murmur heard. No friction rub. No gallop.   Pulmonary:     Effort: Pulmonary effort is normal. No tachypnea or respiratory distress.     Breath sounds: Normal breath sounds. No decreased breath sounds, wheezing, rhonchi or rales.  Chest:     Chest wall: No tenderness.  Abdominal:     General: Bowel sounds are normal.     Palpations: Abdomen is soft.  Musculoskeletal:        General: No edema. Normal range of motion.     Cervical back: Normal range of motion.  Skin:    General: Skin is warm and dry.  Neurological:     Mental Status: She is alert and oriented to person, place, and time.     Coordination: Coordination normal.  Psychiatric:        Mood and Affect: Mood and affect normal.        Behavior: Behavior normal. Behavior is cooperative.        Thought Content: Thought content normal.        Judgment: Judgment normal.          Patient has been counseled extensively about nutrition and exercise as well as the importance of adherence with  medications and regular follow-up. The patient was given clear instructions to go to ER or return to medical center if symptoms don't improve, worsen or new problems develop. The patient verbalized understanding.   Follow-up: Return in about 3 months (around 08/19/2020).   [name], FNP-BC [facility] [place], [place]   05/21/2020, 9:11 AM

## 2020-05-24 NOTE — Telephone Encounter (Signed)
Amber with [facility] called in to check the status of the CMN form faxed back on 05/10/20 need correction to question number [phone]. And if neither apply please put NA. And fax back ASAP Please call Ph# [phone]

## 2020-05-30 NOTE — Telephone Encounter (Signed)
CMA had faxed it back w/ the correction.

## 2020-05-31 ENCOUNTER — Other Ambulatory Visit: Payer: Self-pay | Admitting: Nurse Practitioner

## 2020-06-04 DIAGNOSIS — I639 Cerebral infarction, unspecified: Secondary | ICD-10-CM | POA: Diagnosis not present

## 2020-06-28 ENCOUNTER — Other Ambulatory Visit: Payer: Self-pay | Admitting: Family Medicine

## 2020-06-28 ENCOUNTER — Other Ambulatory Visit: Payer: Self-pay | Admitting: Nurse Practitioner

## 2020-06-28 ENCOUNTER — Other Ambulatory Visit: Payer: Self-pay | Admitting: Neurology

## 2020-06-28 DIAGNOSIS — G4709 Other insomnia: Secondary | ICD-10-CM

## 2020-06-28 DIAGNOSIS — K2100 Gastro-esophageal reflux disease with esophagitis, without bleeding: Secondary | ICD-10-CM

## 2020-06-28 DIAGNOSIS — I10 Essential (primary) hypertension: Secondary | ICD-10-CM

## 2020-06-28 NOTE — Telephone Encounter (Signed)
Future appt in 1 month 

## 2020-06-28 NOTE — Addendum Note (Signed)
Addended by: Mliss Sax on: 06/28/2020 02:47 PM   Modules accepted: Orders

## 2020-06-28 NOTE — Telephone Encounter (Signed)
Requested medication (s) are due for refill today: expired medications  Requested medication (s) are on the active medication list: yes   Last refill:  02/20/20 end: 05/20/20 hydralazine #270 1 refill . Microzide #90 1 refill   Future visit scheduled: yes  Notes to clinic:  expired medications ,. Do you want to renew Rx?     Requested Prescriptions  Pending Prescriptions Disp Refills   hydrALAZINE (APRESOLINE) 50 MG tablet 270 tablet 1    Sig: Take 1 tablet (50 mg total) by mouth 3 (three) times daily.      Cardiovascular:  Vasodilators Passed - 06/28/2020  2:47 PM      Passed - HCT in normal range and within 360 days    Hematocrit  Date Value Ref Range Status  02/20/2020 40.7 34.0 - 46.6 % Final          Passed - HGB in normal range and within 360 days    Hemoglobin  Date Value Ref Range Status  02/20/2020 12.7 11.1 - 15.9 g/dL Final          Passed - RBC in normal range and within 360 days    RBC  Date Value Ref Range Status  02/20/2020 4.64 3.77 - 5.28 x10E6/uL Final  10/05/2019 4.61 3.87 - 5.11 MIL/uL Final          Passed - WBC in normal range and within 360 days    WBC  Date Value Ref Range Status  02/20/2020 6.2 3.4 - 10.8 x10E3/uL Final  10/05/2019 12.7 (H) 4.0 - 10.5 K/uL Final          Passed - PLT in normal range and within 360 days    Platelets  Date Value Ref Range Status  02/20/2020 231 150 - 450 x10E3/uL Final          Passed - Last BP in normal range    BP Readings from Last 1 Encounters:  05/21/20 114/66          Passed - Valid encounter within last 12 months    Recent Outpatient Visits           1 month ago Essential hypertension   [place], [name], NP   2 months ago Onychomycosis   [place], [name], NP   3 months ago Hospital discharge follow-up   [place], [place], NP   4 months ago Essential hypertension    [place], [name], NP   8 months ago Essential hypertension   [place], [name], NP       Future Appointments             In 1 month Revankar, Reita Cliche, MD [facility]   In 1 month [place], [name], NP [place]               hydrochlorothiazide (MICROZIDE) 12.5 MG capsule 90 capsule 1      Cardiovascular: Diuretics - Thiazide Passed - 06/28/2020  2:47 PM      Passed - Ca in normal range and within 360 days    Calcium  Date Value Ref Range Status  04/09/2020 9.9 8.7 - 10.2 mg/dL Final   Calcium, Ion  Date Value Ref Range Status  01/12/2017 1.15 1.15 - 1.40 mmol/L Final  Passed - Cr in normal range and within 360 days    Creat  Date Value Ref Range Status  07/24/2016 1.01 0.50 - 1.10 mg/dL Final   Creatinine, Ser  Date Value Ref Range Status  04/09/2020 0.99 0.57 - 1.00 mg/dL Final          Passed - K in normal range and within 360 days    Potassium  Date Value Ref Range Status  04/09/2020 4.1 3.5 - 5.2 mmol/L Final          Passed - Na in normal range and within 360 days    Sodium  Date Value Ref Range Status  04/09/2020 141 134 - 144 mmol/L Final          Passed - Last BP in normal range    BP Readings from Last 1 Encounters:  05/21/20 114/66          Passed - Valid encounter within last 6 months    Recent Outpatient Visits           1 month ago Essential hypertension   [place], [name], NP   2 months ago Onychomycosis   [place], [name], NP   3 months ago Hospital discharge follow-up   [place], [place] J, NP   4 months ago Essential hypertension   [place], [name], NP   8 months ago Essential hypertension   [place], [name], NP       Future Appointments             In 1 month Revankar, Reita Cliche, MD [facility] [place]   In 1 month [name], NP [place]              Signed Prescriptions Disp Refills   hydrALAZINE (APRESOLINE) 50 MG tablet 270 tablet 1    Sig: TAKE 1 TABLET BY MOUTH 3 TIMES DAILY      Cardiovascular:  Vasodilators Passed - 06/28/2020  9:43 AM      Passed - HCT in normal range and within 360 days    Hematocrit  Date Value Ref Range Status  02/20/2020 40.7 34.0 - 46.6 % Final          Passed - HGB in normal range and within 360 days    Hemoglobin  Date Value Ref Range Status  02/20/2020 12.7 11.1 - 15.9 g/dL Final          Passed - RBC in normal range and within 360 days    RBC  Date Value Ref Range Status  02/20/2020 4.64 3.77 - 5.28 x10E6/uL Final  10/05/2019 4.61 3.87 - 5.11 MIL/uL Final          Passed - WBC in normal range and within 360 days    WBC  Date Value Ref Range Status  02/20/2020 6.2 3.4 - 10.8 x10E3/uL Final  10/05/2019 12.7 (H) 4.0 - 10.5 K/uL Final          Passed - PLT in normal range and within 360 days    Platelets  Date Value Ref Range Status  02/20/2020 231 150 - 450 x10E3/uL Final          Passed - Last BP in normal range    BP Readings from Last 1 Encounters:  05/21/20 114/66  Passed - Valid encounter within last 12 months    Recent Outpatient Visits           1 month ago Essential hypertension   [place] [facility] [place], Shea StakesZelda W, NP   2 months ago Onychomycosis   [facility] [place], Shea StakesZelda W, NP   3 months ago Hospital discharge follow-up   [facility] [place], [place] J, NP   4 months ago Essential hypertension   [place] [facility] [place], Shea StakesZelda W, NP   8 months ago Essential hypertension   [place] [facility]  [place], Shea StakesZelda W, NP       Future Appointments             In 1 month Revankar, Aundra Dubinajan R, MD [facility] [place]   In 1 month [place] RiggFleming, Zelda W, NP [place] [facility]               traZODone (DESYREL) 100 MG tablet 90 tablet 0    Sig: TAKE 1 TABLET BY MOUTH EACH NIGHT AT BEDTIME      Psychiatry: Antidepressants - Serotonin Modulator Passed - 06/28/2020  9:43 AM      Passed - Valid encounter within last 6 months    Recent Outpatient Visits           1 month ago Essential hypertension   [place] [facility] [place], Shea StakesZelda W, NP   2 months ago Onychomycosis   [facility] [place], Shea StakesZelda W, NP   3 months ago Hospital discharge follow-up   [facility] [place], [place] J, NP   4 months ago Essential hypertension   [place] [facility] [place], Shea StakesZelda W, NP   8 months ago Essential hypertension   [place] [facility] [place], Shea StakesZelda W, NP       Future Appointments             In 1 month Revankar, Aundra Dubinajan R, MD [facility] [place]   In 1 month Claiborne RiggFleming, Zelda W, NP  [facility]               hydrochlorothiazide (MICROZIDE) 12.5 MG capsule 90 capsule 1    Sig: TAKE 1 CAPSULE BY MOUTH EACH DAY      Cardiovascular: Diuretics - Thiazide Passed - 06/28/2020  9:43 AM      Passed - Ca in normal range and within 360 days    Calcium  Date Value Ref Range Status  04/09/2020 9.9 8.7 - 10.2 mg/dL Final   Calcium, Ion  Date Value Ref Range Status  01/12/2017 1.15 1.15 - 1.40 mmol/L Final          Passed - Cr in normal range and within 360 days    Creat  Date Value Ref Range Status  07/24/2016 1.01 0.50 - 1.10 mg/dL Final   Creatinine, Ser  Date Value Ref Range Status  04/09/2020 0.99 0.57 - 1.00 mg/dL Final          Passed - K in normal range and within 360 days    Potassium  Date Value  Ref Range Status  04/09/2020 4.1 3.5 - 5.2 mmol/L Final          Passed - Na in normal range and within 360 days    Sodium  Date  Value Ref Range Status  04/09/2020 141 134 - 144 mmol/L Final          Passed - Last BP in normal range    BP Readings from Last 1 Encounters:  05/21/20 114/66          Passed - Valid encounter within last 6 months    Recent Outpatient Visits           1 month ago Essential hypertension   [place], [name], NP   2 months ago Onychomycosis   [place], Zelda W, NP   3 months ago Hospital discharge follow-up   [place], [place], NP   4 months ago Essential hypertension   [place], Zelda W, NP   8 months ago Essential hypertension   [place], [name], NP       Future Appointments             In 1 month Revankar, Reita Cliche, MD [facility] [place]   In 1 month [name], NP [place]

## 2020-06-28 NOTE — Telephone Encounter (Signed)
Requested medication (s) are due for refill today: yes  Requested medication (s) are on the active medication list: yes  Last refill:  05/17/20 #30 with 1 refill  Future visit scheduled: yes  Notes to clinic:  Please review for refill. Refill not delegated per protocol    Requested Prescriptions  Pending Prescriptions Disp Refills   metoCLOPramide (REGLAN) 10 MG tablet [Pharmacy Med Name: METOCLOPRAMIDE HCL 10 MG TAB] 30 tablet 1    Sig: TAKE 1 TABLET BY MOUTH EVERY 6 HOURS AS NEEDED FOR NAUSEA/HEADACHE      Not Delegated - Gastroenterology: Antiemetics Failed - 06/28/2020 10:07 AM      Failed - This refill cannot be delegated      Passed - Valid encounter within last 6 months    Recent Outpatient Visits           1 month ago Essential hypertension   [place], [name], NP   2 months ago Onychomycosis   [place], [name], NP   3 months ago Hospital discharge follow-up   [place], [place], NP   4 months ago Essential hypertension   [place], [name], NP   8 months ago Essential hypertension   [place], [name], NP       Future Appointments             In 1 month Revankar, Reita Cliche, MD [facility] [place]   In 1 month [name], NP [place]

## 2020-06-29 NOTE — Telephone Encounter (Signed)
Rxs has already been refilled

## 2020-07-04 DIAGNOSIS — I639 Cerebral infarction, unspecified: Secondary | ICD-10-CM | POA: Diagnosis not present

## 2020-08-03 DIAGNOSIS — I639 Cerebral infarction, unspecified: Secondary | ICD-10-CM | POA: Diagnosis not present

## 2020-08-15 ENCOUNTER — Other Ambulatory Visit: Payer: Self-pay

## 2020-08-15 DIAGNOSIS — D649 Anemia, unspecified: Secondary | ICD-10-CM | POA: Insufficient documentation

## 2020-08-15 DIAGNOSIS — M1990 Unspecified osteoarthritis, unspecified site: Secondary | ICD-10-CM | POA: Insufficient documentation

## 2020-08-15 DIAGNOSIS — G4730 Sleep apnea, unspecified: Secondary | ICD-10-CM | POA: Insufficient documentation

## 2020-08-15 DIAGNOSIS — F32A Depression, unspecified: Secondary | ICD-10-CM | POA: Insufficient documentation

## 2020-08-15 DIAGNOSIS — F419 Anxiety disorder, unspecified: Secondary | ICD-10-CM | POA: Insufficient documentation

## 2020-08-15 DIAGNOSIS — I351 Nonrheumatic aortic (valve) insufficiency: Secondary | ICD-10-CM | POA: Insufficient documentation

## 2020-08-15 DIAGNOSIS — G43909 Migraine, unspecified, not intractable, without status migrainosus: Secondary | ICD-10-CM | POA: Insufficient documentation

## 2020-08-15 DIAGNOSIS — I10 Essential (primary) hypertension: Secondary | ICD-10-CM | POA: Insufficient documentation

## 2020-08-17 ENCOUNTER — Ambulatory Visit: Payer: Medicare Other | Admitting: Cardiology

## 2020-08-20 ENCOUNTER — Ambulatory Visit: Payer: Medicare Other | Admitting: Nurse Practitioner

## 2020-08-20 ENCOUNTER — Other Ambulatory Visit: Payer: Self-pay

## 2020-09-03 DIAGNOSIS — I639 Cerebral infarction, unspecified: Secondary | ICD-10-CM | POA: Diagnosis not present

## 2020-09-17 ENCOUNTER — Other Ambulatory Visit: Payer: Self-pay | Admitting: Nurse Practitioner

## 2020-09-17 DIAGNOSIS — I10 Essential (primary) hypertension: Secondary | ICD-10-CM

## 2020-09-17 DIAGNOSIS — G4709 Other insomnia: Secondary | ICD-10-CM

## 2020-09-17 DIAGNOSIS — G629 Polyneuropathy, unspecified: Secondary | ICD-10-CM

## 2020-10-03 DIAGNOSIS — I639 Cerebral infarction, unspecified: Secondary | ICD-10-CM | POA: Diagnosis not present

## 2020-10-05 ENCOUNTER — Ambulatory Visit: Payer: Medicare Other | Admitting: Cardiology

## 2020-10-22 ENCOUNTER — Other Ambulatory Visit: Payer: Self-pay | Admitting: Nurse Practitioner

## 2020-10-22 ENCOUNTER — Other Ambulatory Visit: Payer: Self-pay | Admitting: Neurology

## 2020-10-22 NOTE — Telephone Encounter (Signed)
Requested medication (s) are due for refill today: Yes  Requested medication (s) are on the active medication list: Yes  Last refill:  3 weeks ago  Future visit scheduled: Yes  Notes to clinic:  Unable to refill per protocol, last refill by another provider.      Requested Prescriptions  Pending Prescriptions Disp Refills   flurbiprofen (ANSAID) 100 MG tablet [Pharmacy Med Name: FLURBIPROFEN 100 MG TAB] 90 tablet     Sig: TAKE 1 TABLET BY MOUTH EVERY 8 HOURS AS NEEDED (NOT MORE THAN 3 TABLETS A DAY)      Analgesics:  NSAIDS Passed - 10/22/2020 12:26 PM      Passed - Cr in normal range and within 360 days    Creat  Date Value Ref Range Status  07/24/2016 1.01 0.50 - 1.10 mg/dL Final   Creatinine, Ser  Date Value Ref Range Status  04/09/2020 0.99 0.57 - 1.00 mg/dL Final          Passed - HGB in normal range and within 360 days    Hemoglobin  Date Value Ref Range Status  02/20/2020 12.7 11.1 - 15.9 g/dL Final          Passed - Patient is not pregnant      Passed - Valid encounter within last 12 months    Recent Outpatient Visits           5 months ago Essential hypertension   [place], [name], NP   6 months ago Onychomycosis   [place], [name], NP   7 months ago Hospital discharge follow-up   [place], [place], NP   8 months ago Essential hypertension   [place], [name], NP   12 months ago Essential hypertension   [place], [name], NP       Future Appointments             In 2 weeks [name], NP [place]   In 1 month Revankar, Reita Cliche, MD [facility]

## 2020-10-31 ENCOUNTER — Other Ambulatory Visit: Payer: Self-pay

## 2020-10-31 ENCOUNTER — Ambulatory Visit: Payer: Medicare Other | Attending: Nurse Practitioner | Admitting: Nurse Practitioner

## 2020-10-31 ENCOUNTER — Telehealth: Payer: Self-pay | Admitting: Nurse Practitioner

## 2020-10-31 DIAGNOSIS — Z1211 Encounter for screening for malignant neoplasm of colon: Secondary | ICD-10-CM

## 2020-10-31 DIAGNOSIS — Z1231 Encounter for screening mammogram for malignant neoplasm of breast: Secondary | ICD-10-CM

## 2020-10-31 DIAGNOSIS — I10 Essential (primary) hypertension: Secondary | ICD-10-CM

## 2020-10-31 NOTE — Progress Notes (Signed)
She has an appt already scheduled for 6-7 that she would like to keep. Will cancel today's appt.

## 2020-10-31 NOTE — Telephone Encounter (Signed)
No answer. LVM ?

## 2020-11-02 DIAGNOSIS — I639 Cerebral infarction, unspecified: Secondary | ICD-10-CM | POA: Diagnosis not present

## 2020-11-06 ENCOUNTER — Ambulatory Visit: Payer: Medicare Other | Admitting: Nurse Practitioner

## 2020-11-12 ENCOUNTER — Other Ambulatory Visit: Payer: Self-pay | Admitting: Neurology

## 2020-11-12 NOTE — Telephone Encounter (Signed)
Patient called to check on the status of the refill request.

## 2020-11-13 ENCOUNTER — Telehealth: Payer: Self-pay | Admitting: Neurology

## 2020-11-13 NOTE — Telephone Encounter (Signed)
Pt called in wanting to find out why the refill for her Venlafaxine was denied

## 2020-11-14 MED ORDER — VENLAFAXINE HCL ER 75 MG PO CP24
225.0000 mg | ORAL_CAPSULE | Freq: Every day | ORAL | 1 refills | Status: DC
Start: 2020-11-14 — End: 2021-01-15

## 2020-11-22 ENCOUNTER — Other Ambulatory Visit: Payer: Self-pay

## 2020-11-26 ENCOUNTER — Encounter: Payer: Self-pay | Admitting: Cardiology

## 2020-11-26 ENCOUNTER — Ambulatory Visit (INDEPENDENT_AMBULATORY_CARE_PROVIDER_SITE_OTHER): Payer: Medicare Other | Admitting: Cardiology

## 2020-11-26 ENCOUNTER — Other Ambulatory Visit: Payer: Self-pay

## 2020-11-26 VITALS — BP 130/60 | HR 62 | Ht 66.0 in | Wt 219.0 lb

## 2020-11-26 DIAGNOSIS — I10 Essential (primary) hypertension: Secondary | ICD-10-CM

## 2020-11-26 DIAGNOSIS — Z8673 Personal history of transient ischemic attack (TIA), and cerebral infarction without residual deficits: Secondary | ICD-10-CM

## 2020-11-26 DIAGNOSIS — E669 Obesity, unspecified: Secondary | ICD-10-CM

## 2020-11-26 DIAGNOSIS — E782 Mixed hyperlipidemia: Secondary | ICD-10-CM | POA: Insufficient documentation

## 2020-11-26 HISTORY — DX: Mixed hyperlipidemia: E78.2

## 2020-11-26 HISTORY — DX: Obesity, unspecified: E66.9

## 2020-11-26 NOTE — Progress Notes (Signed)
Cardiology Office Note:    Date:  11/26/2020   ID:  Brandy Roberts, DOB 18-Oct-1970, MRN [id number]  PCP:  [name], NP  Cardiologist:  Jenean Lindau, MD   Referring MD: [name], NP    ASSESSMENT:    1. Essential hypertension   2. History of ischemic middle cerebral artery stroke embolic   3. Obesity (BMI 35.0-39.9 without comorbidity)   4. Mixed dyslipidemia    PLAN:    In order of problems listed above:  Primary prevention stressed with the patient.  Importance of compliance with diet medication stressed and she vocalized understanding.  I told her to ambulate to the best of her ability and she promises to do so. Essential hypertension: Blood pressure is stable diet diet was emphasized.  Lifestyle modification urged. Mixed dyslipidemia: Lipids were reviewed she is going to see her primary care in the next few days and get complete blood work including lipids and send [place] a copy. Obesity: Weight reduction was stressed diet was emphasized.  Risks of obesity explained and she promises to do better. Patient will be seen in follow-up appointment in 6 months or earlier if the patient has any concerns    Medication Adjustments/Labs and Tests Ordered: Current medicines are reviewed at length with the patient today.  Concerns regarding medicines are outlined above.  No orders of the defined types were placed in this encounter.  No orders of the defined types were placed in this encounter.    No chief complaint on file.    History of Present Illness:    Brandy Roberts is a 50 y.o. female.  Patient has past medical history of essential hypertension, dyslipidemia, cardiomyopathy and history of stroke.  She denies any problems from a cardiovascular standpoint.  No chest pain orthopnea or PND.  At the time of my evaluation, the patient is alert awake oriented and in no distress.  Past Medical History:  Diagnosis Date   Anemia    Anxiety    Aortic  regurgitation    Cardiomyopathy due to hypertension ([place]) 10/23/2014   Chest discomfort 10/12/2018   Chronic ischemic vertebrobasilar artery thalamic stroke    Depression    Essential hypertension 05/02/2015   Former smoker 01/24/2015   H/O noncompliance with medical treatment, presenting hazards to health    Headache 05/01/2015   History of ischemic middle cerebral artery stroke embolic    Hyperlipidemia    Hypertension    Hypertensive heart disease    Migraine 05/21/2018   Migraines    Nonrheumatic aortic (valve) insufficiency    Osteoarthritis    Overweight (BMI 25.0-29.9)    Palpitations 10/12/2018   Sleep apnea    Snoring 05/02/2015   Stroke ([place]) 10/25/2016   Syncope and collapse 10/07/2018    Past Surgical History:  Procedure Laterality Date   CESAREAN SECTION     GANGLION CYST EXCISION     TEE WITHOUT CARDIOVERSION N/A 10/09/2014   Procedure: TRANSESOPHAGEAL ECHOCARDIOGRAM (TEE);  Surgeon: Sanda Klein, MD;  Location: [facility] ENDOSCOPY;  Service: Cardiovascular;  Laterality: N/A;   TONSILLECTOMY      Current Medications: Current Meds  Medication Sig   amLODipine (NORVASC) 10 MG tablet Take 10 mg by mouth daily.   atorvastatin (LIPITOR) 80 MG tablet Take 80 mg by mouth daily.   busPIRone (BUSPAR) 10 MG tablet Take 10 mg by mouth 3 (three) times daily.   clopidogrel (PLAVIX) 75 MG tablet Take 1 tablet (75 mg total) by mouth daily.  FLUoxetine (PROZAC) 10 MG tablet Take 10 mg by mouth daily.   flurbiprofen (ANSAID) 100 MG tablet Take 100 mg by mouth every 8 (eight) hours.   gabapentin (NEURONTIN) 300 MG capsule Take 1 capsule (300 mg total) by mouth 2 (two) times daily.   Galcanezumab-gnlm 120 MG/ML SOAJ Inject 120 mg into the skin every 30 (thirty) days.   hydrALAZINE (APRESOLINE) 50 MG tablet Take 50 mg by mouth 3 (three) times daily.   hydrochlorothiazide (MICROZIDE) 12.5 MG capsule Take 12.5 mg by mouth daily.   hydrOXYzine (ATARAX/VISTARIL) 50 MG tablet Take 50 mg by  mouth 3 (three) times daily as needed for anxiety or sleep.   lisinopril (ZESTRIL) 10 MG tablet Take 10 mg by mouth daily.   metoCLOPramide (REGLAN) 10 MG tablet Take 10 mg by mouth every 6 (six) hours as needed for nausea or headache.   metoprolol tartrate (LOPRESSOR) 25 MG tablet Take 25 mg by mouth 2 (two) times daily.   mupirocin ointment (BACTROBAN) 2 % Apply 1 application topically 2 (two) times daily.   nitroGLYCERIN (NITROSTAT) 0.4 MG SL tablet Place 0.4 mg under the tongue every 5 (five) minutes as needed for chest pain.   pantoprazole (PROTONIX) 20 MG tablet Take 1 tablet (20 mg total) by mouth daily.   spironolactone (ALDACTONE) 25 MG tablet Take 25 mg by mouth daily.   traZODone (DESYREL) 100 MG tablet Take 100 mg by mouth at bedtime.   venlafaxine XR (EFFEXOR-XR) 75 MG 24 hr capsule Take 3 capsules (225 mg total) by mouth daily with breakfast.     Allergies:   Ibuprofen and Tylenol [acetaminophen]   Social History   Socioeconomic History   Marital status: Single    Spouse name: Not on file   Number of children: 2   Years of education: 12   Highest education level: High school graduate  Occupational History   Occupation: disable   Tobacco Use   Smoking status: Former    Packs/day: 0.25    Pack years: 0.00    Types: Cigarettes    Quit date: 08/31/2017    Years since quitting: 3.2   Smokeless tobacco: Never  Vaping Use   Vaping Use: Never used  Substance and Sexual Activity   Alcohol use: No    Alcohol/week: 0.0 standard drinks   Drug use: Yes    Types: Marijuana   Sexual activity: Not on file  Other Topics Concern   Not on file  Social History Narrative   Patient lives with her uncle in a one story home.  Has 2 sons.  Currently on disability.  Education: high school.   Drinks 1-2 sodas a week       Social Determinants of Radio broadcast assistant Strain: Not on file  Food Insecurity: Not on file  Transportation Needs: Not on file  Physical Activity: Not  on file  Stress: Not on file  Social Connections: Not on file     Family History: The patient's family history includes Cancer in her maternal aunt and mother; Diabetes in her sister; Hypertension in her mother and sister.  ROS:   Please see the history of present illness.    All other systems reviewed and are negative.  EKGs/Labs/Other Studies Reviewed:    The following studies were reviewed today: I discussed my findings with the patient at extensive length including lipids   Recent Labs: 02/20/2020: Hemoglobin 12.7; Platelets 231; TSH 2.150 04/09/2020: ALT 28; BUN 18; Creatinine, Ser 0.99; Potassium 4.1;  Sodium 141  Recent Lipid Panel    Component Value Date/Time   CHOL 124 07/18/2019 0949   TRIG 55 07/18/2019 0949   HDL 46 07/18/2019 0949   CHOLHDL 2.7 07/18/2019 0949   CHOLHDL 3.3 10/25/2016 0248   VLDL 8 10/25/2016 0248   LDLCALC 66 07/18/2019 0949    Physical Exam:    VS:  BP 130/60   Pulse 62   Ht 5\' 6"  (1.676 m)   Wt 219 lb (99.3 kg)   SpO2 98%   BMI 35.35 kg/m     Wt Readings from Last 3 Encounters:  11/26/20 219 lb (99.3 kg)  05/21/20 202 lb 12.8 oz (92 kg)  04/18/20 199 lb (90.3 kg)     GEN: Patient is in no acute distress HEENT: Normal NECK: No JVD; No carotid bruits LYMPHATICS: No lymphadenopathy CARDIAC: Hear sounds regular, 2/6 systolic murmur at the apex. RESPIRATORY:  Clear to auscultation without rales, wheezing or rhonchi  ABDOMEN: Soft, non-tender, non-distended MUSCULOSKELETAL:  No edema; No deformity  SKIN: Warm and dry NEUROLOGIC:  Alert and oriented x 3 PSYCHIATRIC:  Normal affect   Signed, Jenean Lindau, MD  11/26/2020 2:45 PM    [facility]

## 2020-11-26 NOTE — Patient Instructions (Signed)

## 2020-12-04 DIAGNOSIS — I639 Cerebral infarction, unspecified: Secondary | ICD-10-CM | POA: Diagnosis not present

## 2020-12-10 ENCOUNTER — Other Ambulatory Visit: Payer: Self-pay | Admitting: Nurse Practitioner

## 2020-12-10 DIAGNOSIS — K2100 Gastro-esophageal reflux disease with esophagitis, without bleeding: Secondary | ICD-10-CM

## 2020-12-10 NOTE — Telephone Encounter (Signed)
Requested medication (s) are due for refill today: Yes  Requested medication (s) are on the active medication list: Yes  Last refill:  11/22/20  Future visit scheduled: Yes  Notes to clinic:  Historical provider.    Requested Prescriptions  Pending Prescriptions Disp Refills   flurbiprofen (ANSAID) 100 MG tablet [Pharmacy Med Name: FLURBIPROFEN 100 MG TAB] 90 tablet     Sig: TAKE 1 TABLET BY MOUTH EVERY 8 HOURS AS NEEDED (NOT MORE THAN 3 TABLETS A DAY)      Analgesics:  NSAIDS Passed - 12/10/2020 12:06 PM      Passed - Cr in normal range and within 360 days    Creat  Date Value Ref Range Status  07/24/2016 1.01 0.50 - 1.10 mg/dL Final   Creatinine, Ser  Date Value Ref Range Status  04/09/2020 0.99 0.57 - 1.00 mg/dL Final          Passed - HGB in normal range and within 360 days    Hemoglobin  Date Value Ref Range Status  02/20/2020 12.7 11.1 - 15.9 g/dL Final          Passed - Patient is not pregnant      Passed - Valid encounter within last 12 months    Recent Outpatient Visits           6 months ago Essential hypertension   [place], [name], NP   8 months ago Onychomycosis   [place], [name], NP   8 months ago Hospital discharge follow-up   [place], [place], NP   9 months ago Essential hypertension   [place], [name], NP   1 year ago Essential hypertension   [place], [name], NP       Future Appointments             In 1 week [name], NP [place]              Signed Prescriptions Disp Refills   clopidogrel (PLAVIX) 75 MG tablet 90 tablet 0    Sig: TAKE 1 TABLET BY MOUTH EVERY DAY      Hematology: Antiplatelets - clopidogrel Failed - 12/10/2020 12:06 PM      Failed - Evaluate AST, ALT within 2 months  of therapy initiation.      Failed - HCT in normal range and within 180 days    Hematocrit  Date Value Ref Range Status  02/20/2020 40.7 34.0 - 46.6 % Final          Failed - HGB in normal range and within 180 days    Hemoglobin  Date Value Ref Range Status  02/20/2020 12.7 11.1 - 15.9 g/dL Final          Failed - PLT in normal range and within 180 days    Platelets  Date Value Ref Range Status  02/20/2020 231 150 - 450 x10E3/uL Final          Failed - Valid encounter within last 6 months    Recent Outpatient Visits           6 months ago Essential hypertension   [place], [name], NP   8 months ago Onychomycosis   [place], [name], NP  8 months ago Hospital discharge follow-up   [place], [place] J, NP   9 months ago Essential hypertension   [place], [place] W, NP   1 year ago Essential hypertension   [place], [name], NP       Future Appointments             In 1 week [name], NP [place] - ALT in normal range and within 360 days    ALT  Date Value Ref Range Status  04/09/2020 28 0 - 32 IU/L Final          Passed - AST in normal range and within 360 days    AST  Date Value Ref Range Status  04/09/2020 24 0 - 40 IU/L Final            amLODipine (NORVASC) 10 MG tablet 90 tablet 0    Sig: TAKE 1 TABLET BY MOUTH EVERY DAY FOR BLOOD PRESSURE      Cardiovascular:  Calcium Channel Blockers Failed - 12/10/2020 12:06 PM      Failed - Valid encounter within last 6 months    Recent Outpatient Visits           6 months ago Essential hypertension   [place], [name], NP   8 months ago Onychomycosis   [place],  [name], NP   8 months ago Hospital discharge follow-up   [place], [place], NP   9 months ago Essential hypertension   [place], [name], NP   1 year ago Essential hypertension   [place], [name], NP       Future Appointments             In 1 week [name], NP [place] - Last BP in normal range    BP Readings from Last 1 Encounters:  11/26/20 130/60            spironolactone (ALDACTONE) 25 MG tablet 90 tablet 0    Sig: TAKE 1 TABLET BY MOUTH EVERY DAY      Cardiovascular: Diuretics - Aldosterone Antagonist Failed - 12/10/2020 12:06 PM      Failed - Valid encounter within last 6 months    Recent Outpatient Visits           6 months ago Essential hypertension   [place], [name], NP   8 months ago Onychomycosis   [place], [name], NP   8 months ago Hospital discharge follow-up   [place], [place], NP   9 months ago Essential hypertension   [place], [name], NP   1 year ago Essential hypertension   [place], [name], NP       Future Appointments             In 1 week [name], NP [place]  Wellness             Passed - Cr in normal range and within 360 days    Creat  Date Value Ref Range Status  07/24/2016 1.01 0.50 - 1.10 mg/dL Final   Creatinine, Ser  Date Value Ref Range Status  04/09/2020 0.99 0.57 - 1.00 mg/dL Final          Passed - K in normal range and within 360 days    Potassium  Date Value Ref Range Status  04/09/2020 4.1 3.5 - 5.2 mmol/L Final          Passed - Na in normal range and within 360 days    Sodium  Date  Value Ref Range Status  04/09/2020 141 134 - 144 mmol/L Final          Passed - Last BP in normal range    BP Readings from Last 1 Encounters:  11/26/20 130/60

## 2020-12-11 NOTE — Telephone Encounter (Signed)
   Notes to clinic Historical Provider  

## 2020-12-17 ENCOUNTER — Other Ambulatory Visit: Payer: Self-pay

## 2020-12-17 ENCOUNTER — Encounter: Payer: Self-pay | Admitting: Nurse Practitioner

## 2020-12-17 ENCOUNTER — Ambulatory Visit: Payer: Medicare Other | Attending: Nurse Practitioner | Admitting: Nurse Practitioner

## 2020-12-17 VITALS — BP 129/79 | HR 64 | Resp 16 | Wt 217.0 lb

## 2020-12-17 DIAGNOSIS — R7989 Other specified abnormal findings of blood chemistry: Secondary | ICD-10-CM

## 2020-12-17 DIAGNOSIS — Z862 Personal history of diseases of the blood and blood-forming organs and certain disorders involving the immune mechanism: Secondary | ICD-10-CM

## 2020-12-17 DIAGNOSIS — E785 Hyperlipidemia, unspecified: Secondary | ICD-10-CM

## 2020-12-17 DIAGNOSIS — Z7689 Persons encountering health services in other specified circumstances: Secondary | ICD-10-CM | POA: Diagnosis not present

## 2020-12-17 DIAGNOSIS — G629 Polyneuropathy, unspecified: Secondary | ICD-10-CM

## 2020-12-17 DIAGNOSIS — R7303 Prediabetes: Secondary | ICD-10-CM | POA: Diagnosis not present

## 2020-12-17 DIAGNOSIS — Z1211 Encounter for screening for malignant neoplasm of colon: Secondary | ICD-10-CM

## 2020-12-17 DIAGNOSIS — I10 Essential (primary) hypertension: Secondary | ICD-10-CM

## 2020-12-17 MED ORDER — PREGABALIN 50 MG PO CAPS: 50.0000 mg | ORAL_CAPSULE | Freq: Two times a day (BID) | ORAL | 0 refills | Status: DC

## 2020-12-17 NOTE — Progress Notes (Signed)
Assessment & Plan:  Brandy Roberts was seen today for hypertension and leg pain.  Diagnoses and all orders for this visit:  Primary hypertension -     CMP14+EGFR Continue all antihypertensives as prescribed.  Remember to bring in your blood pressure log with you for your follow up appointment.  DASH/Mediterranean Diets are healthier choices for HTN.    Dyslipidemia -     Lipid panel INSTRUCTIONS: Work on a low fat, heart healthy diet and participate in regular aerobic exercise program by working out at least 150 minutes per week; 5 days a week-30 minutes per day. Avoid red meat/beef/steak,  fried foods. junk foods, sodas, sugary drinks, unhealthy snacking, alcohol and smoking.  Drink at least 80 oz of water per day and monitor your carbohydrate intake daily.    Prediabetes -     Hemoglobin A1c  Abnormal TSH -     Thyroid Panel With TSH  Colon cancer screening -     Fecal occult blood, imunochemical([facility])  History of anemia -     CBC  Neuropathy -     pregabalin (LYRICA) 50 MG capsule; Take 1 capsule (50 mg total) by mouth 2 (two) times daily.   Patient has been counseled on age-appropriate routine health concerns for screening and prevention. These are reviewed and up-to-date. Referrals have been placed accordingly. Immunizations are up-to-date or declined.    Subjective:   Chief Complaint  Patient presents with   Hypertension   Leg Pain    B/l    Hypertension Pertinent negatives include no blurred vision, chest pain, headaches, malaise/fatigue, palpitations or shortness of breath.  Leg Pain  Associated symptoms include tingling.  Brandy Roberts 50 y.o. female presents to office today for follow up of chronic health conditions.  Patient has past medical history of essential hypertension, dyslipidemia, cardiomyopathy, anxiety and depression, and history of stroke.   She has intermittent pain in her legs. Lasts 41mn to an hour. Pain is described as stabbing  and burning R>L. Pain is chronic and ongoing for several months. Gabapentin ineffective. She denies any injury or trauma. She does have a history of stroke.   Essential Hypertension Well controlled with amlodipine 10 mg daily, hydralazine 50 mg TID, lisinopril 10 mg daily, lopressor 25 mg BID, HCTZ 12.5 mg daily and spironolactone 25 mg daily. Denies chest pain, shortness of breath, palpitations, lightheadedness, dizziness, headaches or BLE edema.   BP Readings from Last 3 Encounters:  12/17/20 129/79  11/26/20 130/60  05/21/20 114/66    Dyslipidemia LDL at goal with atorvastatin 80 mg daily.  Lab Results  Component Value Date   LPoint Clear66 07/18/2019    Prediabetes Well controlled with diet only at this time.  Lab Results  Component Value Date   HGBA1C 5.9 (A) 02/20/2020    Review of Systems  Constitutional:  Negative for fever, malaise/fatigue and weight loss.  HENT: Negative.  Negative for nosebleeds.   Eyes: Negative.  Negative for blurred vision, double vision and photophobia.  Respiratory: Negative.  Negative for cough and shortness of breath.   Cardiovascular: Negative.  Negative for chest pain, palpitations and leg swelling.  Gastrointestinal: Negative.  Negative for heartburn, nausea and vomiting.  Musculoskeletal:  Positive for joint pain. Negative for myalgias.  Neurological:  Positive for tingling and sensory change. Negative for dizziness, focal weakness, seizures and headaches.  Psychiatric/Behavioral: Negative.  Negative for suicidal ideas.    Past Medical History:  Diagnosis Date   Anemia    Anxiety  Aortic regurgitation    Cardiomyopathy due to hypertension ([place]) 10/23/2014   Chest discomfort 10/12/2018   Chronic ischemic vertebrobasilar artery thalamic stroke    Depression    Essential hypertension 05/02/2015   Former smoker 01/24/2015   H/O noncompliance with medical treatment, presenting hazards to health    Headache 05/01/2015   History of ischemic  middle cerebral artery stroke embolic    Hyperlipidemia    Hypertension    Hypertensive heart disease    Migraine 05/21/2018   Migraines    Nonrheumatic aortic (valve) insufficiency    Osteoarthritis    Overweight (BMI 25.0-29.9)    Palpitations 10/12/2018   Sleep apnea    Snoring 05/02/2015   Stroke ([place]) 10/25/2016   Syncope and collapse 10/07/2018    Past Surgical History:  Procedure Laterality Date   CESAREAN SECTION     GANGLION CYST EXCISION     TEE WITHOUT CARDIOVERSION N/A 10/09/2014   Procedure: TRANSESOPHAGEAL ECHOCARDIOGRAM (TEE);  Surgeon: Sanda Klein, MD;  Location: [facility] ENDOSCOPY;  Service: Cardiovascular;  Laterality: N/A;   TONSILLECTOMY      Family History  Problem Relation Age of Onset   Cancer Mother        type unknown   Hypertension Mother    Hypertension Sister    Diabetes Sister    Cancer Maternal Aunt        4 aunts died of cancer types unknown    Social History Reviewed with no changes to be made today.   Outpatient Medications Prior to Visit  Medication Sig Dispense Refill   amLODipine (NORVASC) 10 MG tablet TAKE 1 TABLET BY MOUTH EVERY DAY FOR BLOOD PRESSURE 90 tablet 0   atorvastatin (LIPITOR) 80 MG tablet Take 80 mg by mouth daily.     busPIRone (BUSPAR) 10 MG tablet Take 10 mg by mouth 3 (three) times daily.     clopidogrel (PLAVIX) 75 MG tablet TAKE 1 TABLET BY MOUTH EVERY DAY 90 tablet 0   FLUoxetine (PROZAC) 10 MG tablet Take 10 mg by mouth daily.     flurbiprofen (ANSAID) 100 MG tablet Take 100 mg by mouth every 8 (eight) hours.     Galcanezumab-gnlm 120 MG/ML SOAJ Inject 120 mg into the skin every 30 (thirty) days.     hydrALAZINE (APRESOLINE) 50 MG tablet TAKE 1 TABLET BY MOUTH 3 TIMES DAILY 90 tablet 0   hydrochlorothiazide (MICROZIDE) 12.5 MG capsule TAKE 1 CAPSULE BY MOUTH DAILY 30 capsule 0   hydrOXYzine (ATARAX/VISTARIL) 50 MG tablet Take 50 mg by mouth 3 (three) times daily as needed for anxiety or sleep.     lisinopril (ZESTRIL)  10 MG tablet Take 10 mg by mouth daily.     metoCLOPramide (REGLAN) 10 MG tablet Take 10 mg by mouth every 6 (six) hours as needed for nausea or headache.     metoprolol tartrate (LOPRESSOR) 25 MG tablet TAKE 1 TABLET BY MOUTH 2 TIMES A DAY 60 tablet 0   mupirocin ointment (BACTROBAN) 2 % Apply 1 application topically 2 (two) times daily. 100 g 3   nitroGLYCERIN (NITROSTAT) 0.4 MG SL tablet Place 0.4 mg under the tongue every 5 (five) minutes as needed for chest pain.     pantoprazole (PROTONIX) 20 MG tablet TAKE 1 TABLET BY MOUTH EVERY DAY FOR ACID REFLUX 90 tablet 0   spironolactone (ALDACTONE) 25 MG tablet TAKE 1 TABLET BY MOUTH EVERY DAY 90 tablet 0   traZODone (DESYREL) 100 MG tablet Take 100 mg by mouth  at bedtime.     venlafaxine XR (EFFEXOR-XR) 75 MG 24 hr capsule Take 3 capsules (225 mg total) by mouth daily with breakfast. 90 capsule 1   gabapentin (NEURONTIN) 300 MG capsule Take 1 capsule (300 mg total) by mouth 2 (two) times daily. 180 capsule 1   No facility-administered medications prior to visit.    Allergies  Allergen Reactions   Ibuprofen Other (See Comments)    Can not take with current medications .   Tylenol [Acetaminophen] Other (See Comments)    Pt stated tylenol gives her extreme headache       Objective:    BP 129/79   Pulse 64   Resp 16   Wt 217 lb (98.4 kg)   SpO2 98%   BMI 35.02 kg/m  Wt Readings from Last 3 Encounters:  12/17/20 217 lb (98.4 kg)  11/26/20 219 lb (99.3 kg)  05/21/20 202 lb 12.8 oz (92 kg)    Physical Exam Vitals and nursing note reviewed.  Constitutional:      Appearance: She is well-developed.  HENT:     Head: Normocephalic and atraumatic.  Cardiovascular:     Rate and Rhythm: Normal rate and regular rhythm.     Heart sounds: Normal heart sounds. No murmur heard.   No friction rub. No gallop.  Pulmonary:     Effort: Pulmonary effort is normal. No tachypnea or respiratory distress.     Breath sounds: Normal breath sounds.  No decreased breath sounds, wheezing, rhonchi or rales.  Chest:     Chest wall: No tenderness.  Abdominal:     General: Bowel sounds are normal.     Palpations: Abdomen is soft.  Musculoskeletal:        General: Normal range of motion.     Cervical back: Normal range of motion.  Skin:    General: Skin is warm and dry.  Neurological:     Mental Status: She is alert and oriented to person, place, and time.     Coordination: Coordination normal.  Psychiatric:        Behavior: Behavior normal. Behavior is cooperative.        Thought Content: Thought content normal.        Judgment: Judgment normal.         Patient has been counseled extensively about nutrition and exercise as well as the importance of adherence with medications and regular follow-up. The patient was given clear instructions to go to ER or return to medical center if symptoms don't improve, worsen or new problems develop. The patient verbalized understanding.   Follow-up: Return for 3 weeks TELE 810 or 1130 f/u leg pain/lyrica. See me in 3 months.   [name], FNP-BC [facility] [place], [place]   12/17/2020, 10:02 AM

## 2020-12-18 LAB — CMP14+EGFR
ALT: 29 IU/L (ref 0–32)
AST: 23 IU/L (ref 0–40)
Albumin/Globulin Ratio: 1.4 (ref 1.2–2.2)
Albumin: 4.2 g/dL (ref 3.8–4.9)
Alkaline Phosphatase: 108 IU/L (ref 44–121)
BUN/Creatinine Ratio: 14 (ref 9–23)
BUN: 14 mg/dL (ref 6–24)
Bilirubin Total: 0.3 mg/dL (ref 0.0–1.2)
CO2: 24 mmol/L (ref 20–29)
Calcium: 9.3 mg/dL (ref 8.7–10.2)
Chloride: 102 mmol/L (ref 96–106)
Creatinine, Ser: 1 mg/dL (ref 0.57–1.00)
Globulin, Total: 3.1 g/dL (ref 1.5–4.5)
Glucose: 128 mg/dL — ABNORMAL HIGH (ref 65–99)
Potassium: 3.9 mmol/L (ref 3.5–5.2)
Sodium: 142 mmol/L (ref 134–144)
Total Protein: 7.3 g/dL (ref 6.0–8.5)
eGFR: 68 mL/min/{1.73_m2} (ref >59)

## 2020-12-18 LAB — CBC
Hematocrit: 42.5 % (ref 34.0–46.6)
Hemoglobin: 13.6 g/dL (ref 11.1–15.9)
MCH: 27.1 pg (ref 26.6–33.0)
MCHC: 32 g/dL (ref 31.5–35.7)
MCV: 85 fL (ref 79–97)
Platelets: 237 10*3/uL (ref 150–450)
RBC: 5.02 x10E6/uL (ref 3.77–5.28)
RDW: 15.1 % (ref 11.7–15.4)
WBC: 6.6 10*3/uL (ref 3.4–10.8)

## 2020-12-18 LAB — LIPID PANEL
Chol/HDL Ratio: 3.1 ratio (ref 0.0–4.4)
Cholesterol, Total: 125 mg/dL (ref 100–199)
HDL: 40 mg/dL (ref >39)
LDL Chol Calc (NIH): 59 mg/dL (ref 0–99)
Triglycerides: 151 mg/dL — ABNORMAL HIGH (ref 0–149)
VLDL Cholesterol Cal: 26 mg/dL (ref 5–40)

## 2020-12-18 LAB — THYROID PANEL WITH TSH
Free Thyroxine Index: 2 (ref 1.2–4.9)
T3 Uptake Ratio: 27 % (ref 24–39)
T4, Total: 7.3 ug/dL (ref 4.5–12.0)
TSH: 1.5 u[IU]/mL (ref 0.450–4.500)

## 2020-12-18 LAB — HEMOGLOBIN A1C
Est. average glucose Bld gHb Est-mCnc: 134 mg/dL
Hgb A1c MFr Bld: 6.3 % — ABNORMAL HIGH (ref 4.8–5.6)

## 2020-12-19 ENCOUNTER — Other Ambulatory Visit: Payer: Self-pay | Admitting: Neurology

## 2021-01-03 DIAGNOSIS — I639 Cerebral infarction, unspecified: Secondary | ICD-10-CM | POA: Diagnosis not present

## 2021-01-08 ENCOUNTER — Other Ambulatory Visit: Payer: Self-pay | Admitting: Nurse Practitioner

## 2021-01-08 NOTE — Telephone Encounter (Signed)
  Notes to clinic:  Medication filled by a historical provider  Review for continued use and refill    Requested Prescriptions  Pending Prescriptions Disp Refills   flurbiprofen (ANSAID) 100 MG tablet      Sig: Take 1 tablet (100 mg total) by mouth every 8 (eight) hours.      Analgesics:  NSAIDS Passed - 01/08/2021  2:05 PM      Passed - Cr in normal range and within 360 days    Creat  Date Value Ref Range Status  07/24/2016 1.01 0.50 - 1.10 mg/dL Final   Creatinine, Ser  Date Value Ref Range Status  12/17/2020 1.00 0.57 - 1.00 mg/dL Final          Passed - HGB in normal range and within 360 days    Hemoglobin  Date Value Ref Range Status  12/17/2020 13.6 11.1 - 15.9 g/dL Final          Passed - Patient is not pregnant      Passed - Valid encounter within last 12 months    Recent Outpatient Visits           3 weeks ago Primary hypertension   [place], [name], NP   7 months ago Essential hypertension   [place], [name], NP   9 months ago Onychomycosis   [place] [name], NP   9 months ago Hospital discharge follow-up   [place], [place], NP   10 months ago Essential hypertension   [place], [name], NP       Future Appointments             In 2 weeks [name], NP [place]   In 2 months [name], NP [place]

## 2021-01-08 NOTE — Telephone Encounter (Signed)
Pt called in to request a refill for her Rx for flurbiprofen (ANSAID) 100 MG tablet . Pt say that she take the medication for her headache.    Pharmacy:  [place], [place] - [id number] Phone:  [phone]  Fax:  [phone]

## 2021-01-10 ENCOUNTER — Ambulatory Visit
Admission: RE | Admit: 2021-01-10 | Discharge: 2021-01-10 | Disposition: A | Discharge status: Home / Self Care | Payer: Medicare Other | Source: Ambulatory Visit | Attending: Nurse Practitioner | Admitting: Nurse Practitioner

## 2021-01-10 ENCOUNTER — Other Ambulatory Visit: Payer: Self-pay

## 2021-01-10 DIAGNOSIS — Z1231 Encounter for screening mammogram for malignant neoplasm of breast: Secondary | ICD-10-CM | POA: Diagnosis not present

## 2021-01-14 ENCOUNTER — Telehealth: Payer: Self-pay | Admitting: Neurology

## 2021-01-14 ENCOUNTER — Other Ambulatory Visit: Payer: Self-pay | Admitting: Nurse Practitioner

## 2021-01-14 NOTE — Telephone Encounter (Signed)
Patient called and said she has had a headache since yesterday. She does not have any medication left to help her, not sure of the name.  Patient requests some help.  [place] in [place]

## 2021-01-14 NOTE — Telephone Encounter (Signed)
Requested medications are due for refill today yes  Requested medications are on the active medication list yes  Last refill 4/18  Last visit 11/2020  Future visit scheduled 12/2020  Notes to clinic Historical Provider

## 2021-01-15 ENCOUNTER — Telehealth: Payer: Self-pay | Admitting: Nurse Practitioner

## 2021-01-15 MED ORDER — VENLAFAXINE HCL ER 75 MG PO CP24: 225.0000 mg | ORAL_CAPSULE | Freq: Every day | ORAL | 1 refills | Status: DC

## 2021-01-15 MED ORDER — FLURBIPROFEN 100 MG PO TABS: ORAL_TABLET | ORAL | 0 refills | Status: DC

## 2021-01-15 MED ORDER — GALCANEZUMAB-GNLM 120 MG/ML ~~LOC~~ SOAJ: 120.0000 mg | SUBCUTANEOUS | 0 refills | Status: DC

## 2021-01-15 NOTE — Addendum Note (Signed)
Addended by: Venetia Night on: 01/15/2021 12:30 PM   Modules accepted: Orders

## 2021-01-15 NOTE — Telephone Encounter (Incomplete Revision)
The patient has been experiencing discomfort in both of their legs, but primarily their right, for roughly a week  The patient shares that they were not experiencing the pain prior to taking this medication   The patient would like to be discuss this reaction with a member of clinical staff when possible   The patient has additional concerns and would like to inquire about being seen in person for their leg swelling   Please contact when available

## 2021-01-15 NOTE — Addendum Note (Signed)
Addended by: Venetia Night on: 01/15/2021 01:51 PM   Modules accepted: Orders

## 2021-01-15 NOTE — Telephone Encounter (Signed)
The patient has been experiencing discomfort in both of their legs, but primarily their right, for roughly a week  The patient shares that they were not experiencing the pain prior to taking this medication   The patient would like to be discuss this reati

## 2021-01-15 NOTE — Progress Notes (Signed)
NEUROLOGY FOLLOW UP OFFICE NOTE  Brandy Roberts [id number]  Assessment/Plan:  1.  Migraine without aura, without status migrainosus, not intractable - today with a persistent headache for a week. 2. Recurrent loss of consciousness.  Semiology characteristic of syncope.  May be due to orthostatic hypotension, vasovagal or cardiac source.  The first event occurred in setting of severe migraine, which may have been vasovagal.  MRI of brain and EEG unremarkable.     1.  Prednisone taper to break current intractable migraine.  Advised no NSAIDs while taking 2. Migraine prophylaxis:  Emgality, venlafaxine XR '225mg'$  daily 3.  Migraine rescue:  Flurbiprofen '100mg'$  4.  Limit use of pain relievers to no more than 2 days out of week to prevent risk of rebound or medication-overuse headache. 5.  Keep headache diary 6.  Follow up in 9 months.   Subjective:  Brandy Roberts is a 50year old right-handed woman with aortic insufficiency, hypertension, hyperlipidemia and history of stroke who follows up for migraines.   UPDATE: Intensity: Moderate Duration: 2 to 3 hours Frequency: 2 days a month  She has had a migraine for the past week and hasn't been able to pick up her medications because she no longer had refills.  Current NSAIDS: Flurbiprofen '100mg'$  Current analgesics: None Current triptans: None Current ergotamine: None Current anti-emetic:  Reglan 10 mg Current muscle relaxants: None Current anti-anxiolytic: None Current sleep aide: trazodone Current Antihypertensive medications: Amlodipine 10 mg, hydralazine, Spironolactone, lisinopril, Lopressor Current Antidepressant medications: Venlafaxine XR 225 mg, fluoxetine '10mg'$  Current Anticonvulsant medications:Lyrica Current anti-CGRP: Emgality Current Vitamins/Herbal/Supplements: None Current Antihistamines/Decongestants: None Other therapy: None Hormone/birth control: None     Caffeine: Coffee, sweet tea, not daily Alcohol:  No Smoker: Cigarettes Diet: Hydrates Exercise: No Depression: Yes; Anxiety: Yes Other pain: Chronic right knee pain Sleep hygiene: Sometimes tosses and turns     HISTORY: MIGRAINES Onset: Migraines since she was young, but worse since her stroke in May 2016. Location:  holocephalic Quality:  pounding Initial Intensity:  10/10 Aura:  no Prodrome:  no Postdrome:  no Associated symptoms: Photophobia, phonophobia.  Rarely nausea.  She has not had any new worse headache of her life, waking up from sleep Initial Duration:  2 days (but Fioricet and ibuprofen lowers intensity down to 2-3/10) Initial Frequency:  Once or twice a month Frequency of abortive medication: only as needed Triggers: None Relieving factors:  Laying down.  Butalbital, ibuprofen.  Tramadol helped but she was told not to take it. Activity:  aggravates   Past NSAIDS:  Ibuprofen, naproxen Past analgesics:  Tylenol, Excedrin, Tramadol (effective but GI upset), Fioricet Past abortive triptans:  no Past muscle relaxants:  no Past anti-emetic:  Zofran Past antihypertensive medications:  no Past antidepressant medications:  no Past anticonvulsant medications: topiramate, gabapentin Past CGRP inhibitor:  None Past vitamins/Herbal/Supplements:  no Other past therapies:  no   Family history of headache:  Sister.   STROKE:  She had a stroke in May 2016 with left sided weakness.  MRI of brain from 10/06/14 was personally reviewed and revealed small acute lacunar infarcts in the right thalamus and right corona radiata, as well as chronic lacunar infarcts in the left hemisphere.  CTA of head and neck revealed diffuse bilateral petrous and cavernous carotid stenosis.  Cardiac source of embolus was not discovered.     She was admitted to [facility] from 10/24/16 to 10/26/16 for stroke-like event.  She suddenly developed left sided weakness and slurred speech with associated headache.  Blood pressure in ED was 120/66.  CT  of head was personally reviewed and negative for acute abnormality.  She had refused tPA.  MRI of brain was personally reviewed and revealed no acute stroke or bleed.  MRA of head revealed no significant intracranial stenosis or occlusion.  LDL was 84.  Hgb A1c was 5.7.  As per neurology evaluation, her deficits appeared to be non-organic, with unusual speech pattern and giveaway weakness.  Somatization was suspected.  2D echo from 11/06/16 demonstrated normal LV EF of 65-70% with no cardiac source of emboli.  Atorvastatin was increased from '10mg'$  to '80mg'$  daily.  Differential includes TIA vs hemiplegic migraine vs somatization.   SYNCOPE: On 09/21/18, she presented to the ED at [facility] for a 5 day intractable holocephalic throbbing headache.  She reports that when EMS was leading her to the ambulance from her bedroom, she passed out for 20-30 seconds.  She described sensation of lightheadedness, like she was going to pass out, as well as tunnel vision and palpitations.  She was afebrile.  CT of head revealed her old left basal ganglia infarct but no acute abnormality.  She was treated with Reglan and Benadryl and headache reportedly resolved.  On 09/26/18, she was sitting on her barstool when she felt lightheaded again.  She noted palpitations as well.  She got up and started walking to her bedroom when she noted tunnel vision and passed out.  She woke up on the floor and bruised her back and leg.  She thinks she may have been unconsciousness for 20 to 30 minute.  There were no witnesses.  She noted urinary incontinence but did not bite her tongue.  She did not have a headache.  She denies change in medications over the past week.  She denies fever or illness.  Due to episodes of recurrent syncopal events, she underwent workup.  MRI of brain with and without contrast on 10/11/18 was personally reviewed and demonstrated stable chronic infarcts but no acute intracranial abnormality.  EEG performed on 10/21/18 was  normal. No recurrent spells.   FALLS: In 2019, she endorsed increased problems with balance and falls.  She stated that they were associated with headache and worsening of her left-sided weakness.  MRI of the brain without contrast from 11/13/2017 was personally reviewed and demonstrated no new intracranial abnormalities.  PAST MEDICAL HISTORY: Past Medical History:  Diagnosis Date   Anemia    Anxiety    Aortic regurgitation    Cardiomyopathy due to hypertension ([place]) 10/23/2014   Chest discomfort 10/12/2018   Chronic ischemic vertebrobasilar artery thalamic stroke    Depression    Essential hypertension 05/02/2015   Former smoker 01/24/2015   H/O noncompliance with medical treatment, presenting hazards to health    Headache 05/01/2015   History of ischemic middle cerebral artery stroke embolic    Hyperlipidemia    Hypertension    Hypertensive heart disease    Migraine 05/21/2018   Migraines    Nonrheumatic aortic (valve) insufficiency    Osteoarthritis    Overweight (BMI 25.0-29.9)    Palpitations 10/12/2018   Sleep apnea    Snoring 05/02/2015   Stroke ([place]) 10/25/2016   Syncope and collapse 10/07/2018    MEDICATIONS: Current Outpatient Medications on File Prior to Visit  Medication Sig Dispense Refill   amLODipine (NORVASC) 10 MG tablet TAKE 1 TABLET BY MOUTH EVERY DAY FOR BLOOD PRESSURE 90 tablet 0   atorvastatin (LIPITOR) 80 MG tablet Take 80 mg by mouth  daily.     busPIRone (BUSPAR) 10 MG tablet Take 10 mg by mouth 3 (three) times daily.     clopidogrel (PLAVIX) 75 MG tablet TAKE 1 TABLET BY MOUTH EVERY DAY 90 tablet 0   FLUoxetine (PROZAC) 10 MG tablet Take 10 mg by mouth daily.     flurbiprofen (ANSAID) 100 MG tablet Take 100 mg by mouth every 8 (eight) hours.     Galcanezumab-gnlm 120 MG/ML SOAJ Inject 120 mg into the skin every 30 (thirty) days.     hydrALAZINE (APRESOLINE) 50 MG tablet TAKE 1 TABLET BY MOUTH 3 TIMES DAILY 90 tablet 0   hydrochlorothiazide (MICROZIDE)  12.5 MG capsule TAKE 1 CAPSULE BY MOUTH DAILY 30 capsule 0   hydrOXYzine (ATARAX/VISTARIL) 50 MG tablet Take 50 mg by mouth 3 (three) times daily as needed for anxiety or sleep.     lisinopril (ZESTRIL) 10 MG tablet Take 10 mg by mouth daily.     metoCLOPramide (REGLAN) 10 MG tablet Take 10 mg by mouth every 6 (six) hours as needed for nausea or headache.     metoprolol tartrate (LOPRESSOR) 25 MG tablet TAKE 1 TABLET BY MOUTH 2 TIMES A DAY 60 tablet 0   mupirocin ointment (BACTROBAN) 2 % Apply 1 application topically 2 (two) times daily. 100 g 3   nitroGLYCERIN (NITROSTAT) 0.4 MG SL tablet Place 0.4 mg under the tongue every 5 (five) minutes as needed for chest pain.     pantoprazole (PROTONIX) 20 MG tablet TAKE 1 TABLET BY MOUTH EVERY DAY FOR ACID REFLUX 90 tablet 0   pregabalin (LYRICA) 50 MG capsule Take 1 capsule (50 mg total) by mouth 2 (two) times daily. 60 capsule 0   spironolactone (ALDACTONE) 25 MG tablet TAKE 1 TABLET BY MOUTH EVERY DAY 90 tablet 0   traZODone (DESYREL) 100 MG tablet Take 100 mg by mouth at bedtime.     venlafaxine XR (EFFEXOR-XR) 75 MG 24 hr capsule Take 3 capsules (225 mg total) by mouth daily with breakfast. 90 capsule 1   No current facility-administered medications on file prior to visit.    ALLERGIES: Allergies  Allergen Reactions   Ibuprofen Other (See Comments)    Can not take with current medications .   Tylenol [Acetaminophen] Other (See Comments)    Pt stated tylenol gives her extreme headache    FAMILY HISTORY: Family History  Problem Relation Age of Onset   Cancer Mother        type unknown   Hypertension Mother    Hypertension Sister    Diabetes Sister    Cancer Maternal Aunt        4 aunts died of cancer types unknown      Objective:  Blood pressure (!) 143/89, pulse 80, height '5\' 6"'$  (1.676 m), weight 214 lb 6.4 oz (97.3 kg), SpO2 100 %. General: No acute distress.  Patient appears well-groomed.   Head:   Normocephalic/atraumatic Eyes:  Fundi examined but not visualized Neck: supple, no paraspinal tenderness, full range of motion Heart:  Regular rate and rhythm Lungs:  Clear to auscultation bilaterally Back: No paraspinal tenderness Neurological Exam: Alert and oriented to person, place, and time.  Speech fluent and not dysarthric, language intact.  Decreased left facial sensation.  Otherwise, CN II-XII intact.  Bulk and tone normal.  Muscle strength 5-/5 left deltoid and biceps, 4+/5 left triceps, handgrip and left lower extremity.  5/5 on right upper and lower extremities.  Reduced amplitude and speed of finger tapping  on left, but arrhythmic.  Mildly reduced light touch sensation on the left, intact on the right.  Deep tendon reflexes 3+ throughout, slightly more brisk on the left.  Finger-to-nose testing with left ataxia.  Left hemiparetic gait.  Romberg with sway.   Metta Clines, DO  CC: [name], NP

## 2021-01-15 NOTE — Telephone Encounter (Signed)
OK to refill any of the medications I prescribe (flurbiprofen, Emgality, venlafaxine)

## 2021-01-15 NOTE — Telephone Encounter (Signed)
Flurbiprofen is '100mg'$  1 tablet every 8 hours as needed (maximum 3 tablets in 24 hours).  Quantity 30.

## 2021-01-15 NOTE — Telephone Encounter (Signed)
Patient states that she needs her headache medication called into the [facility] in [place]   She has appt with Tomi Likens tomorrow  please call her and let her know if we are going to refill it or not

## 2021-01-16 ENCOUNTER — Ambulatory Visit (INDEPENDENT_AMBULATORY_CARE_PROVIDER_SITE_OTHER): Payer: Medicare Other | Admitting: Neurology

## 2021-01-16 ENCOUNTER — Encounter: Payer: Self-pay | Admitting: Neurology

## 2021-01-16 ENCOUNTER — Other Ambulatory Visit: Payer: Self-pay

## 2021-01-16 VITALS — BP 143/89 | HR 80 | Ht 66.0 in | Wt 214.4 lb

## 2021-01-16 DIAGNOSIS — I69354 Hemiplegia and hemiparesis following cerebral infarction affecting left non-dominant side: Secondary | ICD-10-CM

## 2021-01-16 DIAGNOSIS — G43009 Migraine without aura, not intractable, without status migrainosus: Secondary | ICD-10-CM

## 2021-01-16 DIAGNOSIS — I10 Essential (primary) hypertension: Secondary | ICD-10-CM | POA: Diagnosis not present

## 2021-01-16 MED ORDER — PREDNISONE 10 MG PO TABS: ORAL_TABLET | ORAL | 0 refills | Status: DC

## 2021-01-16 NOTE — Patient Instructions (Signed)
To break current daily headache, take prednisone taper:  Take 6tabs x1day, then 5tabs x1day, then 4tabs x1day, then 3tabs x1day, then 2tabs x1day, then 1tab x1day, then STOP While on prednisone, do not take NSAIDs such as flurbiprofen, ibuprofen, naproxen Continue Emgality and venlafaxine May take flurbiprofen as needed after finishing prednisone taper.  Limit use of pain relievers to no more than 2 days out of week to prevent risk of rebound or medication-overuse headache. Follow up in 9 months.

## 2021-01-16 NOTE — Telephone Encounter (Signed)
Pt was called and a VM was left informing patient to return phone call.  Please place patient in virtual slot for 810 any day she is available.

## 2021-01-21 DIAGNOSIS — I639 Cerebral infarction, unspecified: Secondary | ICD-10-CM | POA: Diagnosis not present

## 2021-01-21 DIAGNOSIS — H538 Other visual disturbances: Secondary | ICD-10-CM | POA: Diagnosis not present

## 2021-01-21 NOTE — Telephone Encounter (Unsigned)
Copied from [place] [phone]. Topic: General - Other >> Jan 16, 2021  2:16 PM [place], Brandy Roberts wrote: Pt wanted to let [name] know that the medication she was put on for her leg pain has made her leg worse and pt stated she is going to stop taking it, FYI.

## 2021-01-21 NOTE — Telephone Encounter (Signed)
Noted  

## 2021-01-21 NOTE — Telephone Encounter (Signed)
FYI Zelda.

## 2021-01-28 ENCOUNTER — Encounter: Payer: Self-pay | Admitting: Nurse Practitioner

## 2021-01-28 ENCOUNTER — Ambulatory Visit: Payer: Medicare Other | Attending: Nurse Practitioner | Admitting: Nurse Practitioner

## 2021-01-28 ENCOUNTER — Other Ambulatory Visit: Payer: Self-pay

## 2021-01-28 DIAGNOSIS — F419 Anxiety disorder, unspecified: Secondary | ICD-10-CM | POA: Diagnosis not present

## 2021-01-28 DIAGNOSIS — G629 Polyneuropathy, unspecified: Secondary | ICD-10-CM

## 2021-01-28 MED ORDER — GABAPENTIN 600 MG PO TABS: 600.0000 mg | ORAL_TABLET | Freq: Three times a day (TID) | ORAL | 2 refills | Status: DC

## 2021-01-28 MED ORDER — BUSPIRONE HCL 10 MG PO TABS: 10.0000 mg | ORAL_TABLET | Freq: Three times a day (TID) | ORAL | 3 refills | Status: AC

## 2021-01-28 NOTE — Progress Notes (Signed)
Virtual Visit via Telephone Note Due to national recommendations of social distancing due to [place] 19, telehealth visit is felt to be most appropriate for this patient at this time.  I discussed the limitations, risks, security and privacy concerns of performing an evaluation and management service by telephone and the availability of in person appointments. I also discussed with the patient that there may be a patient responsible charge related to this service. The patient expressed understanding and agreed to proceed.    I connected with Brandy Roberts on 01/28/21  at   8:10 AM EDT  EDT by telephone and verified that I am speaking with the correct person using two identifiers.  Location of Patient: Private Residence   Location of Provider: [facility] [facility] [place] participating in Telemedicine visit: [name] FNP-BC Brianny Lamson    History of Present Illness: Telemedicine visit for: Leg pain/Neuropathy  She was prescribed lyrica last month for presumed bilateral peripheral neuropathy. Pain is intermittent.  Lasts 33mn to an hour. Pain is described as stabbing and burning R>L. Pain is chronic and ongoing for several months. Gabapentin had been ineffective. She denies any injury or trauma. She does have a history of stroke.   Today she states the lyrica caused her neuropathy symptoms to worsen and she wants to restart gabapentin. Declined referral  to see a pain specialist    Past Medical History:  Diagnosis Date   Anemia    Anxiety    Aortic regurgitation    Cardiomyopathy due to hypertension ([place] 10/23/2014   Chest discomfort 10/12/2018   Chronic ischemic vertebrobasilar artery thalamic stroke    Depression    Essential hypertension 05/02/2015   Former smoker 01/24/2015   H/O noncompliance with medical treatment, presenting hazards to health    Headache 05/01/2015   History of ischemic middle cerebral artery stroke embolic     Hyperlipidemia    Hypertension    Hypertensive heart disease    Migraine 05/21/2018   Migraines    Nonrheumatic aortic (valve) insufficiency    Osteoarthritis    Overweight (BMI 25.0-29.9)    Palpitations 10/12/2018   Sleep apnea    Snoring 05/02/2015   [place]roke [place] 10/25/[place]   Syncope and collapse 10/07/2018    Past Surgical History:  Procedure Laterality Date   CESAREAN SECTION     GANGLION CYST EXCISION     TEE WITHOUT CARDIOVERSION N/A 10/09/2014   Procedure: TRANSESOPHAGEAL ECHOCARDIOGRAM (TEE);  Surgeon: MSanda Klein MD;  [facility]  Service: Cardiovascular;  Laterality: N/A;   TONSILLECTOMY      Family History  Problem Relation Age of Onset   Cancer Mother        type unknown   Hypertension Mother    Hypertension Sister    Diabetes Sister    Cancer Maternal Aunt        4 aunts died of cancer types unknown    Social History   Socioeconomic History   Marital status: Single    Spouse name: Not on file   Number of children: 2   Years of education: 12   Highest education level: High school graduate  Occupational History   Occupation: disable   Tobacco Use   Smoking status: Former    Packs/day: 0.25    Types: Cigarettes    Quit date: 08/31/2017    Years since quitting: 3.4   Smokeless tobacco: Never  Vaping Use   Vaping Use: Never used  Substance and Sexual  Activity   Alcohol use: No    Alcohol/week: 0.0 standard drinks   Drug use: Yes    Types: Marijuana   Sexual activity: Not on file  Other Topics Concern   Not on file  Social History Narrative   Patient lives with her uncle in a one story home.  Has 2 sons.  Currently on disability.  Education: high school.   Drinks 1-2 sodas a week       Social Determinants of Radio broadcast assistant Strain: Not on file  Food Insecurity: Not on file  Transportation Needs: Not on file  Physical Activity: Not on file  Stress: Not on file  Social Connections: Not on file      Observations/Objective: Awake, alert and oriented x 3   Review of Systems  Constitutional:  Negative for fever, malaise/fatigue and weight loss.  HENT: Negative.  Negative for nosebleeds.   Eyes: Negative.  Negative for blurred vision, double vision and photophobia.  Respiratory: Negative.  Negative for cough and shortness of breath.   Cardiovascular: Negative.  Negative for chest pain, palpitations and leg swelling.  Gastrointestinal: Negative.  Negative for heartburn, nausea and vomiting.  Musculoskeletal:  Negative for myalgias.       SEE HPI  Neurological:  Positive for sensory change. Negative for dizziness, focal weakness, seizures and headaches.  Psychiatric/Behavioral:  Negative for suicidal ideas. The patient is nervous/anxious.    Assessment and Plan: Diagnoses and all orders for this visit:  Neuropathy -     gabapentin (NEURONTIN) 600 MG tablet; Take 1 tablet (600 mg total) by mouth 3 (three) times daily.  Anxiety -     busPIRone (BUSPAR) 10 MG tablet; Take 1 tablet (10 mg total) by mouth 3 (three) times daily.    Follow Up Instructions Return for She has a follow-up appointment already scheduled for October.     I discussed the assessment and treatment plan with the patient. The patient was provided an opportunity to ask questions and all were answered. The patient agreed with the plan and demonstrated an understanding of the instructions.   The patient was advised to call back or seek an in-person evaluation if the symptoms worsen or if the condition fails to improve as anticipated.  I provided 12 minutes of non-face-to-face time during this encounter including median intraservice time, reviewing previous notes, labs, imaging, medications and explaining diagnosis and management.  [name], FNP-BC

## 2021-02-06 DIAGNOSIS — I639 Cerebral infarction, unspecified: Secondary | ICD-10-CM | POA: Diagnosis not present

## 2021-02-11 ENCOUNTER — Other Ambulatory Visit: Payer: Self-pay | Admitting: Neurology

## 2021-02-18 ENCOUNTER — Telehealth: Payer: Self-pay | Admitting: Neurology

## 2021-02-18 MED ORDER — FLURBIPROFEN 100 MG PO TABS: ORAL_TABLET | ORAL | 5 refills | Status: DC

## 2021-02-18 MED ORDER — VENLAFAXINE HCL ER 75 MG PO CP24: 225.0000 mg | ORAL_CAPSULE | Freq: Every day | ORAL | 5 refills | Status: DC

## 2021-02-18 NOTE — Telephone Encounter (Signed)
Pt called about her refill on flurbiprofen again. She said she doesn't know why jaffe wont refill it. She said her told her if she needed any meds to call him. I let her know that was called in on the 12th of this month, she was aware, and said she needs to speak with someone

## 2021-02-18 NOTE — Telephone Encounter (Signed)
Patient advised Refills Of Venlaflaxine and Flubiprofen.

## 2021-03-05 ENCOUNTER — Other Ambulatory Visit: Payer: Self-pay | Admitting: Nurse Practitioner

## 2021-03-05 NOTE — Telephone Encounter (Signed)
Requested medication (s) are due for refill today: -  Requested medication (s) are on the active medication list: yes  Last refill:  09/27/20  Future visit scheduled: yes  Notes to clinic:  historic provider and med   Requested Prescriptions  Pending Prescriptions Disp Refills   traZODone (DESYREL) 100 MG tablet [Pharmacy Med Name: TRAZODONE HCL 100 MG TAB] 90 tablet     Sig: TAKE 1 TABLET BY MOUTH EVERY NIGHT AT BEDTIME     Psychiatry: Antidepressants - Serotonin Modulator Passed - 03/05/2021 10:34 AM      Passed - Completed PHQ-2 or PHQ-9 in the last 360 days      Passed - Valid encounter within last 6 months    Recent Outpatient Visits           1 month ago Neuropathy   [place], [name], NP   2 months ago Primary hypertension   [place], [name], NP   9 months ago Essential hypertension   [place], [name], NP   11 months ago Onychomycosis   [place], [name], NP   11 months ago Hospital discharge follow-up   [place], [place], NP       Future Appointments             In 2 weeks [name], NP [place]

## 2021-03-08 DIAGNOSIS — I639 Cerebral infarction, unspecified: Secondary | ICD-10-CM | POA: Diagnosis not present

## 2021-03-19 ENCOUNTER — Ambulatory Visit: Payer: Medicare Other | Admitting: Nurse Practitioner

## 2021-03-19 ENCOUNTER — Other Ambulatory Visit: Payer: Self-pay | Admitting: Nurse Practitioner

## 2021-03-19 ENCOUNTER — Other Ambulatory Visit: Payer: Self-pay | Admitting: Family Medicine

## 2021-03-19 DIAGNOSIS — K2100 Gastro-esophageal reflux disease with esophagitis, without bleeding: Secondary | ICD-10-CM

## 2021-03-19 NOTE — Telephone Encounter (Signed)
Requested Prescriptions  Pending Prescriptions Disp Refills  . clopidogrel (PLAVIX) 75 MG tablet [Pharmacy Med Name: CLOPIDOGREL BISULFATE 75 MG TAB] 90 tablet 0    Sig: TAKE 1 TABLET BY MOUTH EVERY DAY     Hematology: Antiplatelets - clopidogrel Failed - 03/19/2021 10:37 AM      Failed - Evaluate AST, ALT within 2 months of therapy initiation.      Passed - ALT in normal range and within 360 days    ALT  Date Value Ref Range Status  12/17/2020 29 0 - 32 IU/L Final         Passed - AST in normal range and within 360 days    AST  Date Value Ref Range Status  12/17/2020 23 0 - 40 IU/L Final         Passed - HCT in normal range and within 180 days    Hematocrit  Date Value Ref Range Status  12/17/2020 42.5 34.0 - 46.6 % Final         Passed - HGB in normal range and within 180 days    Hemoglobin  Date Value Ref Range Status  12/17/2020 13.6 11.1 - 15.9 g/dL Final         Passed - PLT in normal range and within 180 days    Platelets  Date Value Ref Range Status  12/17/2020 237 150 - 450 x10E3/uL Final         Passed - Valid encounter within last 6 months    Recent Outpatient Visits          1 month ago Neuropathy   [place], [name], NP   3 months ago Primary hypertension   [place], [name], NP   10 months ago Essential hypertension   [place], [name], NP   11 months ago Onychomycosis   [place], [name], NP   12 months ago Hospital discharge follow-up   [place], [place], NP      Future Appointments            Tomorrow [name], NP [place]           . pantoprazole (PROTONIX) 20 MG tablet [Pharmacy Med Name: PANTOPRAZOLE SODIUM 20 MG DR TAB] 90 tablet 0    Sig: TAKE 1 TABLET BY MOUTH EVERY DAY FOR ACID REFLUX      Gastroenterology: Proton Pump Inhibitors Passed - 03/19/2021 10:37 AM      Passed - Valid encounter within last 12 months    Recent Outpatient Visits          1 month ago Neuropathy   [place], [name], NP   3 months ago Primary hypertension   [place], [name], NP   10 months ago Essential hypertension   [place], [name], NP   11 months ago Onychomycosis   [place], [name], NP   12 months ago Hospital discharge follow-up   [place], [place], NP      Future Appointments            Tomorrow [name], NP [place]           .  amLODipine (NORVASC) 10 MG tablet [Pharmacy Med Name: AMLODIPINE BESYLATE 10 MG TAB] 90 tablet 0    Sig: TAKE 1 TABLET BY MOUTH EVERY DAY FOR BLOOD PRESSURE     Cardiovascular:  Calcium Channel Blockers Failed - 03/19/2021 10:37 AM      Failed - Last BP in normal range    BP Readings from Last 1 Encounters:  01/16/21 (!) 143/89         Passed - Valid encounter within last 6 months    Recent Outpatient Visits          1 month ago Neuropathy   [place], [name], NP   3 months ago Primary hypertension   [place], [name], NP   10 months ago Essential hypertension   [place], [name], NP   11 months ago Onychomycosis   [place], [name], NP   12 months ago Hospital discharge follow-up   [place], [place], NP      Future Appointments            Tomorrow [name], NP [place]           . spironolactone (ALDACTONE) 25 MG tablet [Pharmacy Med Name: SPIRONOLACTONE 25  MG TAB] 90 tablet 0    Sig: TAKE 1 TABLET BY MOUTH EVERY DAY     Cardiovascular: Diuretics - Aldosterone Antagonist Failed - 03/19/2021 10:37 AM      Failed - Last BP in normal range    BP Readings from Last 1 Encounters:  01/16/21 (!) 143/89         Passed - Cr in normal range and within 360 days    Creat  Date Value Ref Range Status  07/24/2016 1.01 0.50 - 1.10 mg/dL Final   Creatinine, Ser  Date Value Ref Range Status  12/17/2020 1.00 0.57 - 1.00 mg/dL Final         Passed - K in normal range and within 360 days    Potassium  Date Value Ref Range Status  12/17/2020 3.9 3.5 - 5.2 mmol/L Final         Passed - Na in normal range and within 360 days    Sodium  Date Value Ref Range Status  12/17/2020 142 134 - 144 mmol/L Final         Passed - Valid encounter within last 6 months    Recent Outpatient Visits          1 month ago Neuropathy   [place], [name], NP   3 months ago Primary hypertension   [place], [name], NP   10 months ago Essential hypertension   [place], [name], NP   11 months ago Onychomycosis   [place], [name], NP   12 months ago Hospital discharge follow-up   [place], [place], NP      Future Appointments            Tomorrow [name], NP [place]

## 2021-03-19 NOTE — Telephone Encounter (Signed)
Requested Prescriptions  Pending Prescriptions Disp Refills  . metoprolol tartrate (LOPRESSOR) 25 MG tablet [Pharmacy Med Name: METOPROLOL TARTRATE 25 MG TAB] 180 tablet 0    Sig: TAKE 1 TABLET BY MOUTH 2 TIMES A DAY     Cardiovascular:  Beta Blockers Failed - 03/19/2021 10:36 AM      Failed - Last BP in normal range    BP Readings from Last 1 Encounters:  01/16/21 (!) 143/89         Passed - Last Heart Rate in normal range    Pulse Readings from Last 1 Encounters:  01/16/21 80         Passed - Valid encounter within last 6 months    Recent Outpatient Visits          1 month ago Neuropathy   [place], [name], NP   3 months ago Primary hypertension   [place], [name], NP   10 months ago Essential hypertension   [place], [name], NP   11 months ago Onychomycosis   [place], [name], NP   12 months ago Hospital discharge follow-up   [place], [place], NP      Future Appointments            Tomorrow [name], NP [place]           . hydrochlorothiazide (MICROZIDE) 12.5 MG capsule [Pharmacy Med Name: HYDROCHLOROTHIAZIDE 12.5 MG CAP] 90 capsule 0    Sig: TAKE 1 CAPSULE BY MOUTH DAILY     Cardiovascular: Diuretics - Thiazide Failed - 03/19/2021 10:36 AM      Failed - Last BP in normal range    BP Readings from Last 1 Encounters:  01/16/21 (!) 143/89         Passed - Ca in normal range and within 360 days    Calcium  Date Value Ref Range Status  12/17/2020 9.3 8.7 - 10.2 mg/dL Final   Calcium, Ion  Date Value Ref Range Status  01/12/2017 1.15 1.15 - 1.40 mmol/L Final         Passed - Cr in normal range and within 360 days    Creat  Date Value Ref Range Status  07/24/2016 1.01 0.50 - 1.10 mg/dL Final   Creatinine, Ser   Date Value Ref Range Status  12/17/2020 1.00 0.57 - 1.00 mg/dL Final         Passed - K in normal range and within 360 days    Potassium  Date Value Ref Range Status  12/17/2020 3.9 3.5 - 5.2 mmol/L Final         Passed - Na in normal range and within 360 days    Sodium  Date Value Ref Range Status  12/17/2020 142 134 - 144 mmol/L Final         Passed - Valid encounter within last 6 months    Recent Outpatient Visits          1 month ago Neuropathy   [place], [name], NP   3 months ago Primary hypertension   [place], [name], NP   10 months ago Essential hypertension   [place], [name], NP   11  months ago Onychomycosis   [place], NP   12 months ago Hospital discharge follow-up   [place], [place], NP      Future Appointments            Tomorrow [name], NP [employer]           . hydrALAZINE (APRESOLINE) 50 MG tablet [Pharmacy Med Name: HYDRALAZINE HCL 50 MG TAB] 90 tablet 0    Sig: TAKE 1 TABLET BY MOUTH 3 TIMES DAILY     Cardiovascular:  Vasodilators Failed - 03/19/2021 10:36 AM      Failed - Last BP in normal range    BP Readings from Last 1 Encounters:  01/16/21 (!) 143/89         Passed - HCT in normal range and within 360 days    Hematocrit  Date Value Ref Range Status  12/17/2020 42.5 34.0 - 46.6 % Final         Passed - HGB in normal range and within 360 days    Hemoglobin  Date Value Ref Range Status  12/17/2020 13.6 11.1 - 15.9 g/dL Final         Passed - RBC in normal range and within 360 days    RBC  Date Value Ref Range Status  12/17/2020 5.02 3.77 - 5.28 x10E6/uL Final  10/05/2019 4.61 3.87 - 5.11 MIL/uL Final         Passed - WBC in normal range and within 360 days    WBC  Date  Value Ref Range Status  12/17/2020 6.6 3.4 - 10.8 x10E3/uL Final  10/05/2019 12.7 (H) 4.0 - 10.5 K/uL Final         Passed - PLT in normal range and within 360 days    Platelets  Date Value Ref Range Status  12/17/2020 237 150 - 450 x10E3/uL Final         Passed - Valid encounter within last 12 months    Recent Outpatient Visits          1 month ago Neuropathy   [place], [name], NP   3 months ago Primary hypertension   [place], [name], NP   10 months ago Essential hypertension   [place], [name], NP   11 months ago Onychomycosis   [place], [name], NP   12 months ago Hospital discharge follow-up   [place], [place], NP      Future Appointments            Tomorrow [name], NP [place]

## 2021-03-20 ENCOUNTER — Ambulatory Visit: Payer: Medicare Other | Attending: Nurse Practitioner | Admitting: Nurse Practitioner

## 2021-03-20 ENCOUNTER — Other Ambulatory Visit: Payer: Self-pay

## 2021-03-20 ENCOUNTER — Encounter: Payer: Self-pay | Admitting: Nurse Practitioner

## 2021-03-20 VITALS — BP 109/78 | HR 73 | Ht 66.0 in | Wt 217.0 lb

## 2021-03-20 DIAGNOSIS — Z0000 Encounter for general adult medical examination without abnormal findings: Secondary | ICD-10-CM

## 2021-03-20 NOTE — Progress Notes (Addendum)
Assessment & Plan:  Brandy Roberts was seen today for annual exam.  Diagnoses and all orders for this visit:  Encounter for annual physical exam -     CMP14+EGFR   Patient has been counseled on age-appropriate routine health concerns for screening and prevention. These are reviewed and up-to-date. Referrals have been placed accordingly. Immunizations are up-to-date or declined.    Subjective:   Chief Complaint  Patient presents with   Annual Exam   HPI Brandy Roberts 50 y.o. female presents to office today for annual physical exam.   She has a past medical history of Anemia, Anxiety, Aortic regurgitation, Cardiomyopathy due to hypertension (10/23/2014), Chronic ischemic vertebrobasilar artery thalamic stroke, Depression, Essential hypertension (05/02/2015), Former smoker (01/24/2015), H/O noncompliance with medical treatment, presenting hazards to health, Headache (05/01/2015), History of ischemic middle cerebral artery stroke embolic, Hyperlipidemia, Hypertension, Hypertensive heart disease, Migraine (05/21/2018), Migraines, Nonrheumatic aortic (valve) insufficiency, Osteoarthritis, Overweight (BMI 25.0-29.9), Palpitations (10/12/2018), Sleep apnea, Snoring (05/02/2015), Stroke  (10/25/2016), and Syncope and collapse (10/07/2018).    Review of Systems  Constitutional:  Negative for fever, malaise/fatigue and weight loss.  HENT: Negative.  Negative for nosebleeds.   Eyes:  Negative for blurred vision, double vision and photophobia.       Dry eye- right eye  Respiratory: Negative.  Negative for cough and shortness of breath.   Cardiovascular: Negative.  Negative for chest pain, palpitations and leg swelling.  Gastrointestinal: Negative.  Negative for heartburn, nausea and vomiting.  Genitourinary: Negative.   Musculoskeletal: Negative.  Negative for myalgias.  Skin: Negative.   Neurological:  Positive for weakness (left sided). Negative for dizziness, focal weakness, seizures and headaches.   Endo/Heme/Allergies: Negative.   Psychiatric/Behavioral: Negative.  Negative for suicidal ideas.    Past Medical History:  Diagnosis Date   Anemia    Anxiety    Aortic regurgitation    Cardiomyopathy due to hypertension ([place]) 10/23/2014   Chest discomfort 10/12/2018   Chronic ischemic vertebrobasilar artery thalamic stroke    Depression    Essential hypertension 05/02/2015   Former smoker 01/24/2015   H/O noncompliance with medical treatment, presenting hazards to health    Headache 05/01/2015   History of ischemic middle cerebral artery stroke embolic    Hyperlipidemia    Hypertension    Hypertensive heart disease    Migraine 05/21/2018   Migraines    Nonrheumatic aortic (valve) insufficiency    Osteoarthritis    Overweight (BMI 25.0-29.9)    Palpitations 10/12/2018   Sleep apnea    Snoring 05/02/2015   Stroke ([place]) 10/25/2016   Syncope and collapse 10/07/2018    Past Surgical History:  Procedure Laterality Date   CESAREAN SECTION     GANGLION CYST EXCISION     TEE WITHOUT CARDIOVERSION N/A 10/09/2014   Procedure: TRANSESOPHAGEAL ECHOCARDIOGRAM (TEE);  Surgeon: Sanda Klein, MD;  Location[facility] ENDOSCOPY;  Service: Cardiovascular;  Laterality: N/A;   TONSILLECTOMY      Family History  Problem Relation Age of Onset   Cancer Mother        type unknown   Hypertension Mother    Hypertension Sister    Diabetes Sister    Cancer Maternal Aunt        4 aunts died of cancer types unknown    Social History Reviewed with no changes to be made today.   Outpatient Medications Prior to Visit  Medication Sig Dispense Refill   amLODipine (NORVASC) 10 MG tablet TAKE 1 TABLET BY MOUTH EVERY DAY FOR BLOOD  PRESSURE 90 tablet 0   atorvastatin (LIPITOR) 80 MG tablet TAKE 1 TABLET BY MOUTH EVERY DAY AT 6PM FOR CHOLESTEROL 90 tablet 1   busPIRone (BUSPAR) 10 MG tablet Take 1 tablet (10 mg total) by mouth 3 (three) times daily. 90 tablet 3   clopidogrel (PLAVIX) 75 MG tablet TAKE 1  TABLET BY MOUTH EVERY DAY 90 tablet 0   FLUoxetine (PROZAC) 10 MG tablet Take 10 mg by mouth daily.     flurbiprofen (ANSAID) 100 MG tablet 1 tablet every 8 hours as needed (maximum 3 tablets in 24 hours). 30 tablet 5   gabapentin (NEURONTIN) 600 MG tablet Take 1 tablet (600 mg total) by mouth 3 (three) times daily. 270 tablet 2   Galcanezumab-gnlm 120 MG/ML SOAJ Inject 120 mg into the skin every 30 (thirty) days. 1.12 mL 0   hydrALAZINE (APRESOLINE) 50 MG tablet TAKE 1 TABLET BY MOUTH 3 TIMES DAILY 270 tablet 0   hydrochlorothiazide (MICROZIDE) 12.5 MG capsule TAKE 1 CAPSULE BY MOUTH DAILY 90 capsule 0   hydrOXYzine (ATARAX/VISTARIL) 50 MG tablet Take 50 mg by mouth 3 (three) times daily as needed for anxiety or sleep.     lisinopril (ZESTRIL) 10 MG tablet Take 10 mg by mouth daily.     metoCLOPramide (REGLAN) 10 MG tablet Take 10 mg by mouth every 6 (six) hours as needed for nausea or headache.     metoprolol tartrate (LOPRESSOR) 25 MG tablet TAKE 1 TABLET BY MOUTH 2 TIMES A DAY 180 tablet 0   mupirocin ointment (BACTROBAN) 2 % Apply 1 application topically 2 (two) times daily. 100 g 3   nitroGLYCERIN (NITROSTAT) 0.4 MG SL tablet Place 0.4 mg under the tongue every 5 (five) minutes as needed for chest pain.     pantoprazole (PROTONIX) 20 MG tablet TAKE 1 TABLET BY MOUTH EVERY DAY FOR ACID REFLUX 90 tablet 0   predniSONE (DELTASONE) 10 MG tablet Take six tablets on day 1, then five tablets on day 2, then four tablets on day 3, then three tablets on day 4, then two tablets on day 5, then one tablet on day 6, then STOP 21 tablet 0   spironolactone (ALDACTONE) 25 MG tablet TAKE 1 TABLET BY MOUTH EVERY DAY 90 tablet 0   traZODone (DESYREL) 100 MG tablet TAKE 1 TABLET BY MOUTH EVERY NIGHT AT BEDTIME 90 tablet 1   venlafaxine XR (EFFEXOR-XR) 75 MG 24 hr capsule Take 3 capsules (225 mg total) by mouth daily with breakfast. 90 capsule 5   No facility-administered medications prior to visit.     Allergies  Allergen Reactions   Ibuprofen Other (See Comments)    Can not take with current medications .   Tylenol [Acetaminophen] Other (See Comments)    Pt stated tylenol gives her extreme headache       Objective:    BP 109/78   Pulse 73   Ht _0  (1.676 m)   Wt 217 lb (98.4 kg)   SpO2 100%   BMI 35.02 kg/m  Wt Readings from Last 3 Encounters:  03/20/21 217 lb (98.4 kg)  01/16/21 214 lb 6.4 oz (97.3 kg)  12/17/20 217 lb (98.4 kg)    Physical Exam Vitals and nursing note reviewed.  Constitutional:      Appearance: She is well-developed.  HENT:     Head: Normocephalic and atraumatic.     Right Ear: Hearing, tympanic membrane, ear canal and external ear normal.     Left Ear: Hearing,  tympanic membrane, ear canal and external ear normal.     Nose: Nose normal.     Mouth/Throat:     Lips: Pink.     Mouth: Mucous membranes are moist.     Dentition: Has dentures.     Tongue: No lesions.     Pharynx: No pharyngeal swelling or oropharyngeal exudate.  Eyes:     General: No scleral icterus.       Right eye: No foreign body, discharge or hordeolum.        Left eye: No foreign body, discharge or hordeolum.     Extraocular Movements: Extraocular movements intact.     Conjunctiva/sclera: Conjunctivae normal.     Pupils: Pupils are equal, round, and reactive to light.      Comments: Wearing glasses  Neck:     Thyroid: No thyromegaly.     Trachea: No tracheal deviation.  Cardiovascular:     Rate and Rhythm: Normal rate and regular rhythm.     Heart sounds: Normal heart sounds. No murmur heard.   No friction rub. No gallop.  Pulmonary:     Effort: Pulmonary effort is normal. No tachypnea, accessory muscle usage or respiratory distress.     Breath sounds: Normal breath sounds. No decreased breath sounds, wheezing, rhonchi or rales.  Chest:     Chest wall: No tenderness.  Breasts:    Breasts are symmetrical.     Right: No inverted nipple, mass, nipple discharge,  skin change or tenderness.     Left: No inverted nipple, mass, nipple discharge, skin change or tenderness.  Abdominal:     General: Bowel sounds are normal. There is no distension.     Palpations: Abdomen is soft. There is no mass.     Tenderness: There is no abdominal tenderness. There is no guarding or rebound.  Musculoskeletal:        General: No tenderness or deformity. Normal range of motion.     Cervical back: Normal range of motion and neck supple.  Lymphadenopathy:     Cervical: No cervical adenopathy.  Skin:    General: Skin is warm and dry.     Findings: No erythema.  Neurological:     Mental Status: She is alert and oriented to person, place, and time.     Cranial Nerves: No cranial nerve deficit.     Sensory: Sensation is intact.     Motor: Weakness (left sided) present.     Coordination: Coordination normal.     Gait: Gait abnormal (not using cane today).     Deep Tendon Reflexes: Reflexes are normal and symmetric.     Reflex Scores:      Patellar reflexes are 2+ on the right side and 2+ on the left side. Psychiatric:        Speech: Speech normal.        Behavior: Behavior normal. Behavior is cooperative.        Thought Content: Thought content normal.        Judgment: Judgment normal.         Patient has been counseled extensively about nutrition and exercise as well as the importance of adherence with medications and regular follow-up. The patient was given clear instructions to go to ER or return to medical center if symptoms don't improve, worsen or new problems develop. The patient verbalized understanding.   Follow-up: Return in about 3 months (around 06/20/2021).   [name], FNP-BC [facility] [place],  [place] [phone]   03/20/2021, 1:19 PM

## 2021-03-21 LAB — CMP14+EGFR
ALT: 35 IU/L — ABNORMAL HIGH (ref 0–32)
AST: 35 IU/L (ref 0–40)
Albumin/Globulin Ratio: 1.4 (ref 1.2–2.2)
Albumin: 4.3 g/dL (ref 3.8–4.9)
Alkaline Phosphatase: 133 IU/L — ABNORMAL HIGH (ref 44–121)
BUN/Creatinine Ratio: 12 (ref 9–23)
BUN: 14 mg/dL (ref 6–24)
Bilirubin Total: 0.3 mg/dL (ref 0.0–1.2)
CO2: 23 mmol/L (ref 20–29)
Calcium: 9.5 mg/dL (ref 8.7–10.2)
Chloride: 104 mmol/L (ref 96–106)
Creatinine, Ser: 1.14 mg/dL — ABNORMAL HIGH (ref 0.57–1.00)
Globulin, Total: 3 g/dL (ref 1.5–4.5)
Glucose: 170 mg/dL — ABNORMAL HIGH (ref 70–99)
Potassium: 4.1 mmol/L (ref 3.5–5.2)
Sodium: 142 mmol/L (ref 134–144)
Total Protein: 7.3 g/dL (ref 6.0–8.5)
eGFR: 58 mL/min/{1.73_m2} — ABNORMAL LOW (ref >59)

## 2021-03-22 ENCOUNTER — Telehealth: Payer: Self-pay

## 2021-03-22 NOTE — Telephone Encounter (Signed)
-----   Message from [name], NP sent at 03/22/2021 12:32 PM EDT ----- Slightly elevated creatinine and liver enzyme.  Make sure you are not eating a diet that is high in fat or cholesterol and drinking plenty of water 64 to 80 ounces per day and not adding salt to your food

## 2021-04-07 DIAGNOSIS — I639 Cerebral infarction, unspecified: Secondary | ICD-10-CM | POA: Diagnosis not present

## 2021-04-11 ENCOUNTER — Other Ambulatory Visit: Payer: Self-pay | Admitting: Family Medicine

## 2021-04-11 NOTE — Telephone Encounter (Signed)
Call to pharmacy- Rx sent over 01/15/21 was not recorded to have RF Requested Prescriptions  Pending Prescriptions Disp Refills  . atorvastatin (LIPITOR) 80 MG tablet [Pharmacy Med Name: ATORVASTATIN CALCIUM 80 MG TAB] 90 tablet 1    Sig: TAKE 1 TABLET BY MOUTH EVERY DAY AT 6PM FOR CHOLESTEROL     Cardiovascular:  Antilipid - Statins Failed - 04/11/2021 12:16 PM      Failed - Triglycerides in normal range and within 360 days    Triglycerides  Date Value Ref Range Status  12/17/2020 151 (H) 0 - 149 mg/dL Final         Passed - Total Cholesterol in normal range and within 360 days    Cholesterol, Total  Date Value Ref Range Status  12/17/2020 125 100 - 199 mg/dL Final         Passed - LDL in normal range and within 360 days    LDL Chol Calc (NIH)  Date Value Ref Range Status  12/17/2020 59 0 - 99 mg/dL Final         Passed - HDL in normal range and within 360 days    HDL  Date Value Ref Range Status  12/17/2020 40 >39 mg/dL Final         Passed - Patient is not pregnant      Passed - Valid encounter within last 12 months    Recent Outpatient Visits          3 weeks ago Encounter for annual physical exam   [place], [name], NP   2 months ago Neuropathy   [place], [name], NP   3 months ago Primary hypertension   [place], [name], NP   10 months ago Essential hypertension   [place], [name], NP   1 year ago Onychomycosis   [place], [name], NP      Future Appointments            In 2 months [name], NP [place]

## 2021-04-23 ENCOUNTER — Other Ambulatory Visit: Payer: Self-pay | Admitting: Nurse Practitioner

## 2021-04-23 NOTE — Telephone Encounter (Signed)
Requested Prescriptions  Pending Prescriptions Disp Refills  . metoprolol tartrate (LOPRESSOR) 25 MG tablet [Pharmacy Med Name: METOPROLOL TARTRATE 25 MG TAB] 180 tablet 0    Sig: TAKE 1 TABLET BY MOUTH 2 TIMES A DAY     Cardiovascular:  Beta Blockers Passed - 04/23/2021 12:08 PM      Passed - Last BP in normal range    BP Readings from Last 1 Encounters:  03/20/21 109/78         Passed - Last Heart Rate in normal range    Pulse Readings from Last 1 Encounters:  03/20/21 73         Passed - Valid encounter within last 6 months    Recent Outpatient Visits          1 month ago Encounter for annual physical exam   [place], [name], NP   2 months ago Neuropathy   [place], [name], NP   4 months ago Primary hypertension   [place], [name], NP   11 months ago Essential hypertension   [place], [name], NP   1 year ago Onychomycosis   [place], [name], NP      Future Appointments            In 1 month [name], NP [place]           . hydrALAZINE (APRESOLINE) 50 MG tablet [Pharmacy Med Name: HYDRALAZINE HCL 50 MG TAB] 270 tablet 0    Sig: TAKE 1 TABLET BY MOUTH 3 TIMES DAILY     Cardiovascular:  Vasodilators Passed - 04/23/2021 12:08 PM      Passed - HCT in normal range and within 360 days    Hematocrit  Date Value Ref Range Status  12/17/2020 42.5 34.0 - 46.6 % Final         Passed - HGB in normal range and within 360 days    Hemoglobin  Date Value Ref Range Status  12/17/2020 13.6 11.1 - 15.9 g/dL Final         Passed - RBC in normal range and within 360 days    RBC  Date Value Ref Range Status  12/17/2020 5.02 3.77 - 5.28 x10E6/uL Final  10/05/2019 4.61 3.87 - 5.11 MIL/uL Final         Passed - WBC in normal  range and within 360 days    WBC  Date Value Ref Range Status  12/17/2020 6.6 3.4 - 10.8 x10E3/uL Final  10/05/2019 12.7 (H) 4.0 - 10.5 K/uL Final         Passed - PLT in normal range and within 360 days    Platelets  Date Value Ref Range Status  12/17/2020 237 150 - 450 x10E3/uL Final         Passed - Last BP in normal range    BP Readings from Last 1 Encounters:  03/20/21 109/78         Passed - Valid encounter within last 12 months    Recent Outpatient Visits          1 month ago Encounter for annual physical exam   [place], [name], NP   2 months ago Neuropathy   [place], [place]  W, NP   4 months ago Primary hypertension   [place], [name], NP   11 months ago Essential hypertension   [place], [name], NP   1 year ago Onychomycosis   [place], Zelda W, NP      Future Appointments            In 1 month [name], NP [place]           . hydrochlorothiazide (MICROZIDE) 12.5 MG capsule [Pharmacy Med Name: HYDROCHLOROTHIAZIDE 12.5 MG CAP] 90 capsule 0    Sig: TAKE 1 CAPSULE BY MOUTH DAILY     Cardiovascular: Diuretics - Thiazide Failed - 04/23/2021 12:08 PM      Failed - Cr in normal range and within 360 days    Creat  Date Value Ref Range Status  07/24/2016 1.01 0.50 - 1.10 mg/dL Final   Creatinine, Ser  Date Value Ref Range Status  03/20/2021 1.14 (H) 0.57 - 1.00 mg/dL Final         Passed - Ca in normal range and within 360 days    Calcium  Date Value Ref Range Status  03/20/2021 9.5 8.7 - 10.2 mg/dL Final   Calcium, Ion  Date Value Ref Range Status  01/12/2017 1.15 1.15 - 1.40 mmol/L Final         Passed - K in normal range and within 360 days    Potassium  Date Value Ref Range Status  03/20/2021 4.1  3.5 - 5.2 mmol/L Final         Passed - Na in normal range and within 360 days    Sodium  Date Value Ref Range Status  03/20/2021 142 134 - 144 mmol/L Final         Passed - Last BP in normal range    BP Readings from Last 1 Encounters:  03/20/21 109/78         Passed - Valid encounter within last 6 months    Recent Outpatient Visits          1 month ago Encounter for annual physical exam   [place], [name], NP   2 months ago Neuropathy   [place], [name], NP   4 months ago Primary hypertension   [place], [name], NP   11 months ago Essential hypertension   [place], [name], NP   1 year ago Onychomycosis   [place], [name], NP      Future Appointments            In 1 month [name], NP [place]

## 2021-04-30 ENCOUNTER — Emergency Department (HOSPITAL_BASED_OUTPATIENT_CLINIC_OR_DEPARTMENT_OTHER)
Admission: EM | Admit: 2021-04-30 | Discharge: 2021-04-30 | Disposition: A | Discharge status: Home / Self Care | Payer: Medicare Other | Attending: Emergency Medicine | Admitting: Emergency Medicine

## 2021-04-30 ENCOUNTER — Other Ambulatory Visit: Payer: Self-pay

## 2021-04-30 ENCOUNTER — Encounter (HOSPITAL_BASED_OUTPATIENT_CLINIC_OR_DEPARTMENT_OTHER): Payer: Self-pay

## 2021-04-30 DIAGNOSIS — M5441 Lumbago with sciatica, right side: Secondary | ICD-10-CM | POA: Insufficient documentation

## 2021-04-30 DIAGNOSIS — Z79899 Other long term (current) drug therapy: Secondary | ICD-10-CM | POA: Insufficient documentation

## 2021-04-30 DIAGNOSIS — R6889 Other general symptoms and signs: Secondary | ICD-10-CM | POA: Diagnosis not present

## 2021-04-30 DIAGNOSIS — Z87891 Personal history of nicotine dependence: Secondary | ICD-10-CM | POA: Diagnosis not present

## 2021-04-30 DIAGNOSIS — M79604 Pain in right leg: Secondary | ICD-10-CM | POA: Diagnosis present

## 2021-04-30 DIAGNOSIS — I10 Essential (primary) hypertension: Secondary | ICD-10-CM | POA: Insufficient documentation

## 2021-04-30 DIAGNOSIS — Z743 Need for continuous supervision: Secondary | ICD-10-CM | POA: Diagnosis not present

## 2021-04-30 DIAGNOSIS — M5442 Lumbago with sciatica, left side: Secondary | ICD-10-CM | POA: Insufficient documentation

## 2021-04-30 DIAGNOSIS — Z7902 Long term (current) use of antithrombotics/antiplatelets: Secondary | ICD-10-CM | POA: Insufficient documentation

## 2021-04-30 MED ORDER — PREDNISONE 10 MG (21) PO TBPK: ORAL_TABLET | Freq: Every day | ORAL | 0 refills | Status: DC

## 2021-04-30 MED ORDER — METHOCARBAMOL 500 MG PO TABS: 500.0000 mg | ORAL_TABLET | Freq: Two times a day (BID) | ORAL | 0 refills | Status: DC

## 2021-04-30 MED ORDER — FENTANYL CITRATE PF 50 MCG/ML IJ SOSY
50.0000 ug | PREFILLED_SYRINGE | Freq: Once | INTRAMUSCULAR | Status: AC
  Administered 2021-04-30: 50 ug via INTRAMUSCULAR
  Filled 2021-04-30: qty 1

## 2021-04-30 NOTE — ED Notes (Signed)
ED Provider at bedside. 

## 2021-04-30 NOTE — ED Triage Notes (Signed)
Pt requesting to go to vending machine, offered to take patient closer in wheelchair, patient states that she is fine to walk with her cane.

## 2021-04-30 NOTE — Discharge Instructions (Addendum)
Your presentation of leg pain today is consistent with possibly originating your back.  As we discussed the first-line treatment would be to begin ibuprofen, Tylenol, however you discussed that you cannot tolerate these medications.  The second line therapy would be to begin a steroid taper, as well as muscle relaxant up to twice daily.  It is crucial that you follow-up with orthopedics for further evaluation, and potential imaging.  Please return if your pain worsens or fails to improve.

## 2021-04-30 NOTE — ED Triage Notes (Signed)
Pt arrives via [facility] from home with c/o bilateral leg pain for days states that she has chronic leg pain r/t previous stroke. Pt uses a cane at baseline, was able to ambulate to EMS truck. Denies any recent injury. Pt took gabapentin for pain PTA, states she is unable to sleep r/t pain.

## 2021-04-30 NOTE — ED Provider Notes (Signed)
[facility] EMERGENCY DEPARTMENT Provider Note   CSN: [id number] Arrival date & time: 04/30/21  1729     History Chief Complaint  Patient presents with   Leg Pain    Brandy Roberts is a 50 y.o. female with a PMH consistent for stroke, chronic back, leg pain, osteoarthritis presents with complaint of bilateral leg pain, sharp pain radiating down both legs.  Patient reports that this is an acute worsening of known chronic pain.  Patient reports that these problems originated when she had a stroke in 2016.  Patient reports that she is not doing anything for pain at this time because she cannot tolerate ibuprofen, Tylenol.  Patient reports that she has some chronic numbness of her feet, with no acute worsening recently.  Patient denies any recent fall.  Patient denies history of cancer, chronic corticosteroid use, IV drug use, recent fever.  Patient reports that she is able to ambulate with some difficulty, but significant pain.  Patient reports pain is 10/10 with walking, tolerable at rest.   Leg Pain Associated symptoms: back pain       Past Medical History:  Diagnosis Date   Anemia    Anxiety    Aortic regurgitation    Cardiomyopathy due to hypertension ([place]) 10/23/2014   Chest discomfort 10/12/2018   Chronic ischemic vertebrobasilar artery thalamic stroke    Depression    Essential hypertension 05/02/2015   Former smoker 01/24/2015   H/O noncompliance with medical treatment, presenting hazards to health    Headache 05/01/2015   History of ischemic middle cerebral artery stroke embolic    Hyperlipidemia    Hypertension    Hypertensive heart disease    Migraine 05/21/2018   Migraines    Nonrheumatic aortic (valve) insufficiency    Osteoarthritis    Overweight (BMI 25.0-29.9)    Palpitations 10/12/2018   Sleep apnea    Snoring 05/02/2015   Stroke ([place]) 10/25/2016   Syncope and collapse 10/07/2018    Patient Active Problem List   Diagnosis Date Noted   Obesity  (BMI 35.0-39.9 without comorbidity) 11/26/2020   Mixed dyslipidemia 11/26/2020   Anemia    Anxiety    Depression    Hypertension    Migraines    Nonrheumatic aortic (valve) insufficiency    Osteoarthritis    Sleep apnea    Palpitations 10/12/2018   Chest discomfort 10/12/2018   Syncope and collapse 10/07/2018   Migraine 05/21/2018   Stroke ([place]) 10/25/2016   Snoring 05/02/2015   Essential hypertension 05/02/2015   Headache 05/01/2015   Former smoker 01/24/2015   Cardiomyopathy due to hypertension ([place]) 10/23/2014   Chronic ischemic vertebrobasilar artery thalamic stroke    Hypertensive heart disease    Overweight (BMI 25.0-29.9)    H/O noncompliance with medical treatment, presenting hazards to health    Aortic regurgitation    History of ischemic middle cerebral artery stroke embolic    Hyperlipidemia     Past Surgical History:  Procedure Laterality Date   CESAREAN SECTION     GANGLION CYST EXCISION     TEE WITHOUT CARDIOVERSION N/A 10/09/2014   Procedure: TRANSESOPHAGEAL ECHOCARDIOGRAM (TEE);  Surgeon: Sanda Klein, MD;  Location: [facility] ENDOSCOPY;  Service: Cardiovascular;  Laterality: N/A;   TONSILLECTOMY       OB History   No obstetric history on file.     Family History  Problem Relation Age of Onset   Cancer Mother        type unknown   Hypertension Mother  Hypertension Sister    Diabetes Sister    Cancer Maternal Aunt        4 aunts died of cancer types unknown    Social History   Tobacco Use   Smoking status: Former    Packs/day: 0.25    Types: Cigarettes    Quit date: 08/31/2017    Years since quitting: 3.6   Smokeless tobacco: Never  Vaping Use   Vaping Use: Never used  Substance Use Topics   Alcohol use: No    Alcohol/week: 0.0 standard drinks   Drug use: Yes    Types: Marijuana    Home Medications Prior to Admission medications   Medication Sig Start Date End Date Taking? Authorizing Provider  methocarbamol (ROBAXIN) 500 MG tablet  Take 1 tablet (500 mg total) by mouth 2 (two) times daily. 04/30/21  Yes Caroljean Monsivais H, PA-C  predniSONE (STERAPRED UNI-PAK 21 TAB) 10 MG (21) TBPK tablet Take by mouth daily. Take 6 tabs by mouth daily  for 2 days, then 5 tabs for 2 days, then 4 tabs for 2 days, then 3 tabs for 2 days, 2 tabs for 2 days, then 1 tab by mouth daily for 2 days 04/30/21  Yes Amiree No H, PA-C  amLODipine (NORVASC) 10 MG tablet TAKE 1 TABLET BY MOUTH EVERY DAY FOR BLOOD PRESSURE 03/19/21   [name], NP  atorvastatin (LIPITOR) 80 MG tablet TAKE 1 TABLET BY MOUTH EVERY DAY AT 6PM FOR CHOLESTEROL 04/11/21   Charlott Rakes, MD  busPIRone (BUSPAR) 10 MG tablet Take 1 tablet (10 mg total) by mouth 3 (three) times daily. 01/28/21   [name], NP  clopidogrel (PLAVIX) 75 MG tablet TAKE 1 TABLET BY MOUTH EVERY DAY 03/19/21   [name], NP  FLUoxetine (PROZAC) 10 MG tablet Take 10 mg by mouth daily.    [provider]  flurbiprofen (ANSAID) 100 MG tablet 1 tablet every 8 hours as needed (maximum 3 tablets in 24 hours). 02/18/21   Pieter Partridge, DO  gabapentin (NEURONTIN) 600 MG tablet Take 1 tablet (600 mg total) by mouth 3 (three) times daily. 01/28/21 04/28/21  [name], NP  Galcanezumab-gnlm 120 MG/ML SOAJ Inject 120 mg into the skin every 30 (thirty) days. 01/15/21   Tomi Likens, Adam R, DO  hydrALAZINE (APRESOLINE) 50 MG tablet TAKE 1 TABLET BY MOUTH 3 TIMES DAILY 04/23/21   [name], NP  hydrochlorothiazide (MICROZIDE) 12.5 MG capsule TAKE 1 CAPSULE BY MOUTH DAILY 04/23/21   [name], NP  hydrOXYzine (ATARAX/VISTARIL) 50 MG tablet Take 50 mg by mouth 3 (three) times daily as needed for anxiety or sleep.    [provider]  lisinopril (ZESTRIL) 10 MG tablet Take 10 mg by mouth daily.    [provider]  metoCLOPramide (REGLAN) 10 MG tablet Take 10 mg by mouth every 6 (six) hours as needed for nausea or headache.    [provider]   metoprolol tartrate (LOPRESSOR) 25 MG tablet TAKE 1 TABLET BY MOUTH 2 TIMES A DAY 04/23/21   [name], NP  mupirocin ointment (BACTROBAN) 2 % Apply 1 application topically 2 (two) times daily. 05/21/20   [name], NP  nitroGLYCERIN (NITROSTAT) 0.4 MG SL tablet Place 0.4 mg under the tongue every 5 (five) minutes as needed for chest pain.    [provider]  pantoprazole (PROTONIX) 20 MG tablet TAKE 1 TABLET BY MOUTH EVERY DAY FOR ACID REFLUX 03/19/21  [name], NP  spironolactone (ALDACTONE) 25 MG tablet TAKE 1 TABLET BY MOUTH EVERY DAY 03/19/21   [name], NP  traZODone (DESYREL) 100 MG tablet TAKE 1 TABLET BY MOUTH EVERY NIGHT AT BEDTIME 03/06/21   Charlott Rakes, MD  venlafaxine XR (EFFEXOR-XR) 75 MG 24 hr capsule Take 3 capsules (225 mg total) by mouth daily with breakfast. 02/18/21   Pieter Partridge, DO    Allergies    Ibuprofen and Tylenol [acetaminophen]  Review of Systems   Review of Systems  Musculoskeletal:  Positive for back pain and gait problem.  All other systems reviewed and are negative.  Physical Exam Updated Vital Signs BP (!) 144/64 (BP Location: Right Arm)   Pulse 70   Temp 98.7 F (37.1 C) (Oral)   Resp 17   Ht 5\' 6"  (1.676 m)   Wt 97.1 kg   SpO2 100%   BMI 34.54 kg/m   Physical Exam Vitals and nursing note reviewed.  Constitutional:      General: She is not in acute distress.    Appearance: Normal appearance.  HENT:     Head: Normocephalic and atraumatic.  Eyes:     General:        Right eye: No discharge.        Left eye: No discharge.  Cardiovascular:     Rate and Rhythm: Normal rate and regular rhythm.     Pulses: Normal pulses.     Heart sounds: No murmur heard.   No friction rub. No gallop.     Comments: Intact DP, PT pulses bilaterally Pulmonary:     Effort: Pulmonary effort is normal.     Breath sounds: Normal breath sounds.  Abdominal:     General: Bowel sounds are normal.     Palpations:  Abdomen is soft.  Musculoskeletal:     Comments: Patient with minimal to no tenderness to palpation of entirety of back including midline spine cervical, thoracic, lumbar.  Patient has intact strength 5 out of 5 of the right leg, slightly decreased strength of the left leg.  Patient reports that this is stable compared to her baseline secondary to stroke.  Patient with some tenderness to palpation of the low lumbar paraspinous muscles, and greater trochanter bilaterally.  Skin:    General: Skin is warm and dry.     Capillary Refill: Capillary refill takes less than 2 seconds.  Neurological:     Mental Status: She is alert and oriented to person, place, and time.  Psychiatric:        Mood and Affect: Mood normal.        Behavior: Behavior normal.    ED Results / Procedures / Treatments   Labs (all labs ordered are listed, but only abnormal results are displayed) Labs Reviewed - No data to display  EKG None  Radiology No results found.  Procedures Procedures   Medications Ordered in ED Medications  fentaNYL (SUBLIMAZE) injection 50 mcg (has no administration in time range)    ED Course  I have reviewed the triage vital signs and the nursing notes.  Pertinent labs & imaging results that were available during my care of the patient were reviewed by me and considered in my medical decision making (see chart for details).    MDM Rules/Calculators/A&P                         Overall well-appearing patient with complaint of acute on  chronic leg pain, back pain.  Patient reports that she has had some pain consistently since her stroke in 2016.  Patient reports that worse today.  Patient reports that she does not tolerate taking Tylenol or ibuprofen secondary to medication reactions, stomach upset.  She is had some gabapentin for pain, however she has been unable to sleep due to her pain.  Patient reports no recent fall, injury, denies any numbness, tingling.  Patient has no red flags  for back pain including chronic corticosteroid use, history of cancer, IV drug use, recent fever.  No concern for cauda equina syndrome, no saddle anesthesia, urinary retention, difficulty with defecation.  This patient has not had prior imaging would recommend at least radiographs of the spine.  Patient reports that she has been waiting for a long time she does not wish for any imaging at this time, denies any acute falls, is not worried about a fracture.  Difficult to fully assess without radiographic imaging at this time, however patient with pain consistent with either lumbar back pain with bilateral sciatica, or SI joint dysfunction secondary to unilateral weakness, patient favoring 1 side since her stroke.  Discussed pain control regimen is somewhat limited by patient's inability to take Tylenol, ibuprofen.  We will control pain with fentanyl injection prior to discharge, and prescribe course of prednisone, as well as muscle relaxant for pain.  Encourage close follow-up with orthopedics.  Patient discharged in stable condition at this time, return precautions given. Final Clinical Impression(s) / ED Diagnoses Final diagnoses:  Acute bilateral low back pain with bilateral sciatica    Rx / DC Orders ED Discharge Orders          Ordered    predniSONE (STERAPRED UNI-PAK 21 TAB) 10 MG (21) TBPK tablet  Daily        04/30/21 2259    methocarbamol (ROBAXIN) 500 MG tablet  2 times daily        04/30/21 2259             Tyheim Vanalstyne, Joesph Fillers, PA-C 05/01/21 [id number]    Gareth Morgan, MD 05/01/21 1227

## 2021-05-01 ENCOUNTER — Ambulatory Visit (INDEPENDENT_AMBULATORY_CARE_PROVIDER_SITE_OTHER): Payer: Medicare Other | Admitting: Family Medicine

## 2021-05-01 ENCOUNTER — Encounter: Payer: Self-pay | Admitting: Family Medicine

## 2021-05-01 VITALS — BP 128/80 | Ht 66.0 in | Wt 214.0 lb

## 2021-05-01 DIAGNOSIS — M5416 Radiculopathy, lumbar region: Secondary | ICD-10-CM

## 2021-05-01 HISTORY — DX: Radiculopathy, lumbar region: M54.16

## 2021-05-01 MED ORDER — HYDROCODONE-ACETAMINOPHEN 5-325 MG PO TABS: 1.0000 | ORAL_TABLET | Freq: Three times a day (TID) | ORAL | 0 refills | Status: DC | PRN

## 2021-05-01 NOTE — Patient Instructions (Signed)
Nice to meet you Please try heat  Please try the exercises  Please use the pain medicine as needed for severe pain.  Please try physical therapy   Please send me a message in MyChart with any questions or updates.  Please see me back in 2 weeks.   --Dr. Raeford Razor

## 2021-05-01 NOTE — Assessment & Plan Note (Signed)
She is most consistent with nerve irritation.  Possible to have spinal stenosis origin.  Has a history of stroke that affected her left side.  Does not appear to be joint related at the knee or hip.  Has negative straight leg raise at this time. -Counseled on home exercise therapy and supportive care. -Referral to physical therapy. -[place]. -Could consider further imaging.

## 2021-05-01 NOTE — Progress Notes (Signed)
Brandy Roberts - 50 y.o. female MRN [id number]  Date of birth: Oct 12, 1970  SUBJECTIVE:  Including CC & ROS.  No chief complaint on file.   Brandy Roberts is a 50 y.o. female that is presenting with acute bilateral leg pain.  The pain is occurring in the proximal aspect of each leg and radiates distally.  Has been ongoing for 3 to 4 months.  Has acutely gotten worse.  No history of injury or inciting event.  Has a history of stroke.  Seems to be worse with lying down.   Review of Systems See HPI   HISTORY: Past Medical, Surgical, Social, and Family History Reviewed & Updated per EMR.   Pertinent Historical Findings include:  Past Medical History:  Diagnosis Date   Anemia    Anxiety    Aortic regurgitation    Cardiomyopathy due to hypertension ([place]) 10/23/2014   Chest discomfort 10/12/2018   Chronic ischemic vertebrobasilar artery thalamic stroke    Depression    Essential hypertension 05/02/2015   Former smoker 01/24/2015   H/O noncompliance with medical treatment, presenting hazards to health    Headache 05/01/2015   History of ischemic middle cerebral artery stroke embolic    Hyperlipidemia    Hypertension    Hypertensive heart disease    Migraine 05/21/2018   Migraines    Nonrheumatic aortic (valve) insufficiency    Osteoarthritis    Overweight (BMI 25.0-29.9)    Palpitations 10/12/2018   Sleep apnea    Snoring 05/02/2015   Stroke ([place]) 10/25/2016   Syncope and collapse 10/07/2018    Past Surgical History:  Procedure Laterality Date   CESAREAN SECTION     GANGLION CYST EXCISION     TEE WITHOUT CARDIOVERSION N/A 10/09/2014   Procedure: TRANSESOPHAGEAL ECHOCARDIOGRAM (TEE);  Surgeon: Sanda Klein, MD;  Location: [facility] ENDOSCOPY;  Service: Cardiovascular;  Laterality: N/A;   TONSILLECTOMY      Family History  Problem Relation Age of Onset   Cancer Mother        type unknown   Hypertension Mother    Hypertension Sister    Diabetes Sister    Cancer Maternal Aunt         4 aunts died of cancer types unknown    Social History   Socioeconomic History   Marital status: Single    Spouse name: Not on file   Number of children: 2   Years of education: 12   Highest education level: High school graduate  Occupational History   Occupation: disable   Tobacco Use   Smoking status: Former    Packs/day: 0.25    Types: Cigarettes    Quit date: 08/31/2017    Years since quitting: 3.6   Smokeless tobacco: Never  Vaping Use   Vaping Use: Never used  Substance and Sexual Activity   Alcohol use: No    Alcohol/week: 0.0 standard drinks   Drug use: Yes    Types: Marijuana   Sexual activity: Not on file  Other Topics Concern   Not on file  Social History Narrative   Patient lives with her uncle in a one story home.  Has 2 sons.  Currently on disability.  Education: high school.   Drinks 1-2 sodas a week       Social Determinants of Radio broadcast assistant Strain: Not on file  Food Insecurity: Not on file  Transportation Needs: Not on file  Physical Activity: Not on file  Stress: Not on file  Social Connections:  Not on file  Intimate Partner Violence: Not on file     PHYSICAL EXAM:  VS: BP 128/80 (BP Location: Right Arm, Patient Position: Sitting)   Ht 5\' 6"  (1.676 m)   Wt 214 lb (97.1 kg)   BMI 34.54 kg/m  Physical Exam Gen: NAD, alert, cooperative with exam, well-appearing    ASSESSMENT & PLAN:   Lumbar radiculopathy She is most consistent with nerve irritation.  Possible to have spinal stenosis origin.  Has a history of stroke that affected her left side.  Does not appear to be joint related at the knee or hip.  Has negative straight leg raise at this time. -Counseled on home exercise therapy and supportive care. -Referral to physical therapy. -[place]. -Could consider further imaging.

## 2021-05-07 ENCOUNTER — Other Ambulatory Visit: Payer: Self-pay

## 2021-05-07 ENCOUNTER — Ambulatory Visit: Payer: Medicare Other | Attending: Family Medicine | Admitting: Physical Therapy

## 2021-05-07 ENCOUNTER — Encounter: Payer: Self-pay | Admitting: Physical Therapy

## 2021-05-07 DIAGNOSIS — I639 Cerebral infarction, unspecified: Secondary | ICD-10-CM | POA: Diagnosis not present

## 2021-05-07 DIAGNOSIS — M6281 Muscle weakness (generalized): Secondary | ICD-10-CM | POA: Diagnosis not present

## 2021-05-07 DIAGNOSIS — R262 Difficulty in walking, not elsewhere classified: Secondary | ICD-10-CM | POA: Insufficient documentation

## 2021-05-07 DIAGNOSIS — R2689 Other abnormalities of gait and mobility: Secondary | ICD-10-CM | POA: Insufficient documentation

## 2021-05-07 DIAGNOSIS — M5416 Radiculopathy, lumbar region: Secondary | ICD-10-CM | POA: Diagnosis not present

## 2021-05-07 DIAGNOSIS — M5441 Lumbago with sciatica, right side: Secondary | ICD-10-CM | POA: Insufficient documentation

## 2021-05-07 DIAGNOSIS — M5442 Lumbago with sciatica, left side: Secondary | ICD-10-CM | POA: Insufficient documentation

## 2021-05-07 DIAGNOSIS — R2681 Unsteadiness on feet: Secondary | ICD-10-CM | POA: Diagnosis not present

## 2021-05-07 NOTE — Patient Instructions (Signed)
Access Code: [email or web address] URL: [email or web address] Date: 05/07/2021 Prepared by: Glenetta Hew  Exercises Supine Posterior Pelvic Tilt - 1 x daily - 7 x weekly - 2 sets - 10 reps Supine Lower Trunk Rotation - 1 x daily - 7 x weekly - 1 sets - 10 reps Supine Bridge - 1 x daily - 7 x weekly - 2 sets - 10 reps Sit to Stand with Counter Support - 1 x daily - 7 x weekly - 2 sets - 10 reps

## 2021-05-07 NOTE — Therapy (Signed)
[facility] [address]  [place], [place], [address] Phone: [phone]   Fax:  [phone]  Physical Therapy Evaluation  Patient Details  Name: Brandy Roberts MRN: [id number] Date of Birth: [birth year] Referring Provider (PT): Clearance Coots   Encounter Date: 05/07/2021   PT End of Session - 05/07/21 1156     Visit Number 1    Number of Visits 12    Date for PT Re-Evaluation 06/18/21    Authorization Type UHC Medicare & Medicaid    PT Start Time 1102    PT Stop Time 1150    PT Time Calculation (min) 48 min    Activity Tolerance Patient tolerated treatment well    Behavior During Therapy [facility] for tasks assessed/performed             Past Medical History:  Diagnosis Date   Anemia    Anxiety    Aortic regurgitation    Cardiomyopathy due to hypertension ([place]) 10/23/2014   Chest discomfort 10/12/2018   Chronic ischemic vertebrobasilar artery thalamic stroke    Depression    Essential hypertension 05/02/2015   Former smoker 01/24/2015   H/O noncompliance with medical treatment, presenting hazards to health    Headache 05/01/2015   History of ischemic middle cerebral artery stroke embolic    Hyperlipidemia    Hypertension    Hypertensive heart disease    Migraine 05/21/2018   Migraines    Nonrheumatic aortic (valve) insufficiency    Osteoarthritis    Overweight (BMI 25.0-29.9)    Palpitations 10/12/2018   Sleep apnea    Snoring 05/02/2015   Stroke ([place]) 10/25/2016   Syncope and collapse 10/07/2018    Past Surgical History:  Procedure Laterality Date   CESAREAN SECTION     GANGLION CYST EXCISION     TEE WITHOUT CARDIOVERSION N/A 10/09/2014   Procedure: TRANSESOPHAGEAL ECHOCARDIOGRAM (TEE);  Surgeon: Sanda Klein, MD;  Location: [facility] ENDOSCOPY;  Service: Cardiovascular;  Laterality: N/A;   TONSILLECTOMY      There were no vitals filed for this visit.    Subjective Assessment - 05/07/21 1117      Subjective Patient is referred for lumbar radiculopathy which started 4-5 months ago.  Just got into bed one day with legs killing her, she was put on some medicine which didn't help, waiting to go back to doctor. Has gotten significantly worse.  Has HHA, comes 5 days/week who helps with ADLs and light housekeeping.    Pertinent History stroke, OA, migraines, HTN, HLD, depression, anxiety, anemia, ganglion cyst excision    Limitations Standing;Walking;Lifting;House hold activities    How long can you sit comfortably? 1 hour    How long can you stand comfortably? 5 min    How long can you walk comfortably? can't walk that far, legs give out    Patient Stated Goals get better, not have pain    Currently in Pain? No/denies    Pain Score 0-No pain   10/10 standing and walking   Pain Location Back    Pain Orientation Lower    Pain Descriptors / Indicators Stabbing    Pain Type Acute pain    Pain Radiating Towards front of both legs down to toes    Pain Onset More than a month ago   4-5 months ago   Pain Frequency Intermittent    Aggravating Factors  standing and walking    Pain Relieving Factors sitting down    Effect of Pain  on Daily Activities can't do anything, just sits and waits for next Dr. appt.                [facility] PT Assessment - 05/07/21 0001       Assessment   Medical Diagnosis M54.16 (ICD-10-CM) - Lumbar radiculopathy    Referring Provider (PT) Clearance Coots    Onset Date/Surgical Date --   4-5 months ago   Hand Dominance Right    Next MD Visit 05/15/2021 Raeford Razor, 05/19/21 Dr Raul Del      Precautions   Precautions Fall      Restrictions   Weight Bearing Restrictions No      Balance Screen   Has the patient fallen in the past 6 months No    Has the patient had a decrease in activity level because of a fear of falling?  Yes    Is the patient reluctant to leave their home because of a fear of falling?  Yes      [place]  residence    Living Arrangements Alone    Type of [place] Access Level entry    Home Layout One level      Prior Function   Level of Independence Needs assistance with ADLs    Vocation On disability      Observation/Other Assessments   Focus on Therapeutic Outcomes (FOTO)  24   44 predicted after 13 visits     ROM / Strength   AROM / PROM / Strength Strength;PROM      AROM   Overall AROM  Deficits    Overall AROM Comments seated lumbar ROM due to safety, limited rotation bil, no pain with foward flexion      PROM   Overall PROM  Deficits    Overall PROM Comments decreased hip ROM bil - more ER on R side, but 15 deg IR, on L more limited ER but about 30 deg IR      Strength   Overall Strength Deficits    Overall Strength Comments history of L sided weakness due to CVA, no apparent change in strength since last seen in 2021.    Right Hip Flexion 4+/5    Right Hip ABduction 4+/5    Right Hip ADduction 3/5    Left Hip Flexion 3/5    Left Hip ABduction 3+/5    Left Hip ADduction 3/5    Right Knee Flexion 3+/5    Right Knee Extension 5/5    Left Knee Flexion 3/5    Left Knee Extension 3/5    Right Ankle Dorsiflexion 4+/5    Left Ankle Dorsiflexion 3+/5      Flexibility   Soft Tissue Assessment /Muscle Length yes    Hamstrings mild tightness bil, negative SLR      Palpation   Palpation comment tenderness to palpation over lumbar spine and bil piriformis, tested in sitting today      Special Tests   Other special tests negative SLR, negative FABER      Transfers   Five time sit to stand comments  52 sec with UE support.      Ambulation/Gait   Gait Pattern Step-to pattern;Step-through pattern;Decreased hip/knee flexion - left;Decreased stance time - left;Decreased weight shift to left;Left genu recurvatum;Poor foot clearance - left      Standardized Balance Assessment   Five times sit to stand comments  52   with UE assist  Objective measurements completed on examination: See above findings.       [facility] Adult PT Treatment/Exercise - 05/07/21 0001       Exercises   Exercises Lumbar      Lumbar Exercises: Stretches   Single Knee to Chest Stretch Limitations unable due to LUE weakness.    Lower Trunk Rotation 5 reps    Lower Trunk Rotation Limitations PROM    Pelvic Tilt 5 reps;5 seconds      Lumbar Exercises: Seated   Sit to Stand 5 reps    Sit to Stand Limitations UE support      Lumbar Exercises: Supine   Bridge 5 reps    Bridge Limitations cues for TrA contraction                     PT Education - 05/07/21 1156     Education Details education on findings, plan of care, initial HEP.  Access Code: [email or web address]    Person(s) Educated Patient    Methods Explanation;Demonstration;Handout;Verbal cues;Tactile cues    Comprehension Verbalized understanding;Returned demonstration              PT Short Term Goals - 05/07/21 1204       PT SHORT TERM GOAL #1   Title Patient to be independent with initial HEP.    Time 2    Period Weeks    Status New    Target Date 05/21/21               PT Long Term Goals - 05/07/21 1205       PT LONG TERM GOAL #1   Title Patient to be independent with advanced HEP.    Time 6    Period Weeks    Status New    Target Date 06/18/21      PT LONG TERM GOAL #2   Title Patient will be able to complete 5x STS with UE support in <30 seconds to demonstrate improved functional strength.    Baseline 52 seconds with UE support.    Time 6    Period Weeks    Status New    Target Date 06/18/21      PT LONG TERM GOAL #3   Title Patient will report 75% improvement in LBP/radicular symptoms.    Time 6    Period Weeks    Status New    Target Date 06/18/21      PT LONG TERM GOAL #4   Title Patient will tolerated standing/walking for 10 minutes without severe LBP/bil LE to perform ADLs    Baseline 10/10 pain after 5 min  standing/walking    Time 6    Period Weeks    Status New    Target Date 06/18/21      PT LONG TERM GOAL #5   Title Patient will demonstrate improved physical functioning by improving FOTO score to 44.    Baseline 24    Time 6    Period Weeks    Status New    Target Date 06/18/21                    Plan - 05/07/21 1158     Clinical Impression Statement Patient reports acute bil LE pain radiating from low back that started about 4-5 months ago.  She reports increased pain with standing and walking, and decreased pain if sits down.  She has history of CVA with L sided weakness and balance impairments.  Today she demonstrates tenderness over lumbar spine, bil piriformis, tightness bil hips, but negative SLR and FABER.  She also demonstrates significant deconditioning and weakness, with 5x STS score of 52 seconds.  Given initial HEP for core strengthening in supine as well as sit to stands for functional strengthening.  She would benefit from skilled physical therapy.    Personal Factors and Comorbidities Comorbidity 3+;Transportation;Time since onset of injury/illness/exacerbation;Fitness;Past/Current Experience    Comorbidities stroke, OA, migraines, HTN, HLD, depression, anxiety, anemia    Examination-Activity Limitations Bathing;Bend;Stairs;Carry;Stand;Toileting;Dressing;Transfers;Hygiene/Grooming;Lift;Locomotion Level;Reach Overhead;Sleep;Bed Mobility    Examination-Participation Restrictions Church;Cleaning;Interpersonal Relationship;Laundry;Meal Prep    Stability/Clinical Decision Making Evolving/Moderate complexity    Clinical Decision Making Moderate    Rehab Potential Good    PT Frequency 2x / week    PT Duration 6 weeks    PT Treatment/Interventions ADLs/Self Care Home Management;Cryotherapy;Electrical Stimulation;Moist Heat;Balance training;Therapeutic exercise;Therapeutic activities;Functional mobility training;Stair training;Gait training;Ultrasound;Neuromuscular  re-education;Patient/family education;Manual techniques;Vasopneumatic Device;Taping;Passive range of motion;Spinal Manipulations;Joint Manipulations;Dry needling;Traction    PT Next Visit Plan review and progress neutral spine exercises, manual therapy and modalities PRN    PT Home Exercise Plan Access Code: [email or web address]    Consulted and Agree with Plan of Care Patient             Patient will benefit from skilled therapeutic intervention in order to improve the following deficits and impairments:  Abnormal gait, Decreased knowledge of precautions, Decreased activity tolerance, Decreased strength, Decreased balance, Decreased mobility, Difficulty walking, Improper body mechanics, Decreased range of motion, Impaired flexibility, Postural dysfunction, Decreased endurance, Increased muscle spasms, Impaired tone, Decreased safety awareness, Increased fascial restricitons, Pain  Visit Diagnosis: Acute midline low back pain with bilateral sciatica  Difficulty in walking, not elsewhere classified  Muscle weakness (generalized)     Problem List Patient Active Problem List   Diagnosis Date Noted   Lumbar radiculopathy 05/01/2021   Obesity (BMI 35.0-39.9 without comorbidity) 11/26/2020   Mixed dyslipidemia 11/26/2020   Anemia    Anxiety    Depression    Hypertension    Migraines    Nonrheumatic aortic (valve) insufficiency    Osteoarthritis    Sleep apnea    Palpitations 10/12/2018   Chest discomfort 10/12/2018   Syncope and collapse 10/07/2018   Migraine 05/21/2018   Stroke ([place]) 10/25/2016   Snoring 05/02/2015   Essential hypertension 05/02/2015   Headache 05/01/2015   Former smoker 01/24/2015   Cardiomyopathy due to hypertension ([place]) 10/23/2014   Chronic ischemic vertebrobasilar artery thalamic stroke    Hypertensive heart disease    Overweight (BMI 25.0-29.9)    H/O noncompliance with medical treatment, presenting hazards to health    Aortic regurgitation    History of  ischemic middle cerebral artery stroke embolic    Hyperlipidemia     Rennie Natter, PT, DPT  05/07/2021, 12:18 PM  [facility] [address]  [place], [place], [address] Phone: [phone]   Fax:  [phone]  Name: Jude Linck MRN: [id number] Date of Birth: 22-Jul-1970

## 2021-05-09 ENCOUNTER — Other Ambulatory Visit: Payer: Self-pay | Admitting: Nurse Practitioner

## 2021-05-09 ENCOUNTER — Other Ambulatory Visit: Payer: Self-pay

## 2021-05-09 ENCOUNTER — Ambulatory Visit: Payer: Medicare Other

## 2021-05-09 DIAGNOSIS — M5416 Radiculopathy, lumbar region: Secondary | ICD-10-CM | POA: Diagnosis not present

## 2021-05-09 DIAGNOSIS — R262 Difficulty in walking, not elsewhere classified: Secondary | ICD-10-CM | POA: Diagnosis not present

## 2021-05-09 DIAGNOSIS — R2689 Other abnormalities of gait and mobility: Secondary | ICD-10-CM | POA: Diagnosis not present

## 2021-05-09 DIAGNOSIS — M5442 Lumbago with sciatica, left side: Secondary | ICD-10-CM | POA: Diagnosis not present

## 2021-05-09 DIAGNOSIS — R2681 Unsteadiness on feet: Secondary | ICD-10-CM | POA: Diagnosis not present

## 2021-05-09 DIAGNOSIS — M5441 Lumbago with sciatica, right side: Secondary | ICD-10-CM

## 2021-05-09 DIAGNOSIS — M6281 Muscle weakness (generalized): Secondary | ICD-10-CM | POA: Diagnosis not present

## 2021-05-09 NOTE — Therapy (Signed)
[facility] [address]  [place], [place], [address] Phone: [phone]   Fax:  [phone]  Physical Therapy Treatment  Patient Details  Name: Brandy Roberts MRN: [id number] Date of Birth: 12/12/1970 Referring Provider (PT): Clearance Coots   Encounter Date: 05/09/2021   PT End of Session - 05/09/21 1152     Visit Number 2    Number of Visits 12    Date for PT Re-Evaluation 06/18/21    Authorization Type UHC Medicare & Medicaid    PT Start Time 1103    PT Stop Time 1145    PT Time Calculation (min) 42 min    Activity Tolerance Patient tolerated treatment well;Patient limited by fatigue;Patient limited by pain    Behavior During Therapy [facility] for tasks assessed/performed             Past Medical History:  Diagnosis Date   Anemia    Anxiety    Aortic regurgitation    Cardiomyopathy due to hypertension ([place]) 10/23/2014   Chest discomfort 10/12/2018   Chronic ischemic vertebrobasilar artery thalamic stroke    Depression    Essential hypertension 05/02/2015   Former smoker 01/24/2015   H/O noncompliance with medical treatment, presenting hazards to health    Headache 05/01/2015   History of ischemic middle cerebral artery stroke embolic    Hyperlipidemia    Hypertension    Hypertensive heart disease    Migraine 05/21/2018   Migraines    Nonrheumatic aortic (valve) insufficiency    Osteoarthritis    Overweight (BMI 25.0-29.9)    Palpitations 10/12/2018   Sleep apnea    Snoring 05/02/2015   Stroke ([place]) 10/25/2016   Syncope and collapse 10/07/2018    Past Surgical History:  Procedure Laterality Date   CESAREAN SECTION     GANGLION CYST EXCISION     TEE WITHOUT CARDIOVERSION N/A 10/09/2014   Procedure: TRANSESOPHAGEAL ECHOCARDIOGRAM (TEE);  Surgeon: Sanda Klein, MD;  Location: [facility] ENDOSCOPY;  Service: Cardiovascular;  Laterality: N/A;   TONSILLECTOMY      There were no vitals filed for this  visit.   Subjective Assessment - 05/09/21 1107     Subjective Pt reports that both of her legs tend to hurt when she is laying down, she feels like she can hardly walk.    Pertinent History stroke, OA, migraines, HTN, HLD, depression, anxiety, anemia, ganglion cyst excision    Diagnostic tests 10/11/18 brain MRI: No acute intracranial abnormality.Unchanged small chronic infarcts.    Patient Stated Goals get better, not have pain    Currently in Pain? No/denies                               [facility] Adult PT Treatment/Exercise - 05/09/21 0001       Self-Care   Self-Care Posture    Posture edu on laying down posture, pillows under legs in supine/prone, pillow btw knees in S/L      Exercises   Exercises Lumbar      Lumbar Exercises: Stretches   Single Knee to Chest Stretch Right;Left;30 seconds    Lower Trunk Rotation Limitations 10x2"      Lumbar Exercises: Aerobic   Nustep L2x40min      Lumbar Exercises: Seated   Sit to Stand 5 reps   2 sets   Sit to Stand Limitations UE support    Other Seated Lumbar Exercises ball squeezes with PPT  10x3"      Lumbar Exercises: Supine   Pelvic Tilt 10 reps    Pelvic Tilt Limitations 3 seconds; PPT      Knee/Hip Exercises: Seated   Other Seated Knee/Hip Exercises heel squeezes 6 reps    Marching AROM;Both;10 reps                       PT Short Term Goals - 05/09/21 1152       PT SHORT TERM GOAL #1   Title Patient to be independent with initial HEP.    Time 2    Period Weeks    Status On-going    Target Date 05/21/21               PT Long Term Goals - 05/09/21 1152       PT LONG TERM GOAL #1   Title Patient to be independent with advanced HEP.    Time 6    Period Weeks    Status On-going    Target Date 06/18/21      PT LONG TERM GOAL #2   Title Patient will be able to complete 5x STS with UE support in <30 seconds to demonstrate improved functional strength.    Baseline 52 seconds  with UE support.    Time 6    Period Weeks    Status On-going    Target Date 06/18/21      PT LONG TERM GOAL #3   Title Patient will report 75% improvement in LBP/radicular symptoms.    Time 6    Period Weeks    Status On-going    Target Date 06/18/21      PT LONG TERM GOAL #4   Title Patient will tolerated standing/walking for 10 minutes without severe LBP/bil LE to perform ADLs    Baseline 10/10 pain after 5 min standing/walking    Time 6    Period Weeks    Status On-going    Target Date 06/18/21      PT LONG TERM GOAL #5   Title Patient will demonstrate improved physical functioning by improving FOTO score to 44.    Baseline 24    Time 6    Period Weeks    Status On-going    Target Date 06/18/21                   Plan - 05/09/21 1151     Clinical Impression Statement Pt had a tendency to get fatigued with the strengthening exercises today. She had limited tolerance for laying in supine, requiring frequent position changes. Educated her on laying down posture to help reduce lower back strain. She showed a poor tolerance for the exercises due to quick fatigue, LE weakness and pain. Postural cues given constantly during exercises.    Personal Factors and Comorbidities Comorbidity 3+;Transportation;Time since onset of injury/illness/exacerbation;Fitness;Past/Current Experience    Comorbidities stroke, OA, migraines, HTN, HLD, depression, anxiety, anemia    PT Frequency 2x / week    PT Duration 6 weeks    PT Treatment/Interventions ADLs/Self Care Home Management;Cryotherapy;Electrical Stimulation;Moist Heat;Balance training;Therapeutic exercise;Therapeutic activities;Functional mobility training;Stair training;Gait training;Ultrasound;Neuromuscular re-education;Patient/family education;Manual techniques;Vasopneumatic Device;Taping;Passive range of motion;Spinal Manipulations;Joint Manipulations;Dry needling;Traction    PT Next Visit Plan review and progress neutral spine  exercises, LE strengthening to build endurance    PT Home Exercise Plan Access Code: [email or web address]    Consulted and Agree with Plan of Care Patient  Patient will benefit from skilled therapeutic intervention in order to improve the following deficits and impairments:  Abnormal gait, Decreased knowledge of precautions, Decreased activity tolerance, Decreased strength, Decreased balance, Decreased mobility, Difficulty walking, Improper body mechanics, Decreased range of motion, Impaired flexibility, Postural dysfunction, Decreased endurance, Increased muscle spasms, Impaired tone, Decreased safety awareness, Increased fascial restricitons, Pain  Visit Diagnosis: Acute midline low back pain with bilateral sciatica  Difficulty in walking, not elsewhere classified  Muscle weakness (generalized)  Unsteadiness on feet  Other abnormalities of gait and mobility     Problem List Patient Active Problem List   Diagnosis Date Noted   Lumbar radiculopathy 05/01/2021   Obesity (BMI 35.0-39.9 without comorbidity) 11/26/2020   Mixed dyslipidemia 11/26/2020   Anemia    Anxiety    Depression    Hypertension    Migraines    Nonrheumatic aortic (valve) insufficiency    Osteoarthritis    Sleep apnea    Palpitations 10/12/2018   Chest discomfort 10/12/2018   Syncope and collapse 10/07/2018   Migraine 05/21/2018   Stroke ([place]) 10/25/2016   Snoring 05/02/2015   Essential hypertension 05/02/2015   Headache 05/01/2015   Former smoker 01/24/2015   Cardiomyopathy due to hypertension ([place]) 10/23/2014   Chronic ischemic vertebrobasilar artery thalamic stroke    Hypertensive heart disease    Overweight (BMI 25.0-29.9)    H/O noncompliance with medical treatment, presenting hazards to health    Aortic regurgitation    History of ischemic middle cerebral artery stroke embolic    Hyperlipidemia     Artist Pais, PTA 05/09/2021, 11:53 AM  [facility] [address]  [place], [place], [address] Phone: [phone]   Fax:  [phone]  Name: Brandy Roberts MRN: [id number] Date of Birth: 09-Mar-1971

## 2021-05-09 NOTE — Telephone Encounter (Signed)
Requested medication (s) are due for refill today: NO requesting early  Requested medication (s) are on the active medication list: yes  Last refill:  03/19/21 for 90 day supply  Future visit scheduled: 06/19/21  Notes to clinic:  requesting early, please assess.   Requested Prescriptions  Pending Prescriptions Disp Refills   clopidogrel (PLAVIX) 75 MG tablet [Pharmacy Med Name: CLOPIDOGREL BISULFATE 75 MG TAB] 90 tablet 0    Sig: TAKE 1 TABLET BY MOUTH EVERY DAY     Hematology: Antiplatelets - clopidogrel Failed - 05/09/2021  3:20 PM      Failed - Evaluate AST, ALT within 2 months of therapy initiation.      Failed - ALT in normal range and within 360 days    ALT  Date Value Ref Range Status  03/20/2021 35 (H) 0 - 32 IU/L Final          Passed - AST in normal range and within 360 days    AST  Date Value Ref Range Status  03/20/2021 35 0 - 40 IU/L Final          Passed - HCT in normal range and within 180 days    Hematocrit  Date Value Ref Range Status  12/17/2020 42.5 34.0 - 46.6 % Final          Passed - HGB in normal range and within 180 days    Hemoglobin  Date Value Ref Range Status  12/17/2020 13.6 11.1 - 15.9 g/dL Final          Passed - PLT in normal range and within 180 days    Platelets  Date Value Ref Range Status  12/17/2020 237 150 - 450 x10E3/uL Final          Passed - Valid encounter within last 6 months    Recent Outpatient Visits           1 month ago Encounter for annual physical exam   [place], [name], NP   3 months ago Neuropathy   [place], [name], NP   4 months ago Primary hypertension   [place], [name], NP   11 months ago Essential hypertension   [place], [name], NP   1 year ago Onychomycosis   [place], [name], NP        Future Appointments             In 1 month [name], NP [place]

## 2021-05-13 ENCOUNTER — Other Ambulatory Visit: Payer: Self-pay | Admitting: Nurse Practitioner

## 2021-05-13 NOTE — Telephone Encounter (Signed)
Requested medication (s) are due for refill today - no  Requested medication (s) are on the active medication list -yes  Future visit scheduled -yes  Last refill: 03/19/21 #90  Notes to clinic: Call to pharmacy- confirmed number and date- call to patient- she states she is out and does not have anymore. Verified dosing once daily. Patient states she needs. Request sent to office for review.  Requested Prescriptions  Pending Prescriptions Disp Refills   clopidogrel (PLAVIX) 75 MG tablet [Pharmacy Med Name: CLOPIDOGREL BISULFATE 75 MG TAB] 90 tablet 0    Sig: TAKE 1 TABLET BY MOUTH EVERY DAY     Hematology: Antiplatelets - clopidogrel Failed - 05/13/2021  7:59 AM      Failed - Evaluate AST, ALT within 2 months of therapy initiation.      Failed - ALT in normal range and within 360 days    ALT  Date Value Ref Range Status  03/20/2021 35 (H) 0 - 32 IU/L Final          Passed - AST in normal range and within 360 days    AST  Date Value Ref Range Status  03/20/2021 35 0 - 40 IU/L Final          Passed - HCT in normal range and within 180 days    Hematocrit  Date Value Ref Range Status  12/17/2020 42.5 34.0 - 46.6 % Final          Passed - HGB in normal range and within 180 days    Hemoglobin  Date Value Ref Range Status  12/17/2020 13.6 11.1 - 15.9 g/dL Final          Passed - PLT in normal range and within 180 days    Platelets  Date Value Ref Range Status  12/17/2020 237 150 - 450 x10E3/uL Final          Passed - Valid encounter within last 6 months    Recent Outpatient Visits           1 month ago Encounter for annual physical exam   [place], [name], NP   3 months ago Neuropathy   [place], [name], NP   4 months ago Primary hypertension   [place], [name], NP   11 months ago Essential hypertension   [place], [name], NP   1 year ago Onychomycosis   [place], [name], NP       Future Appointments             In 1 month [name], NP [place]               Requested Prescriptions  Pending Prescriptions Disp Refills   clopidogrel (PLAVIX) 75 MG tablet [Pharmacy Med Name: CLOPIDOGREL BISULFATE 75 MG TAB] 90 tablet 0    Sig: TAKE 1 TABLET BY MOUTH EVERY DAY     Hematology: Antiplatelets - clopidogrel Failed - 05/13/2021  7:59 AM      Failed - Evaluate AST, ALT within 2 months of therapy initiation.      Failed - ALT in normal range and within 360 days    ALT  Date Value Ref Range Status  03/20/2021 35 (H) 0 - 32 IU/L Final  Passed - AST in normal range and within 360 days    AST  Date Value Ref Range Status  03/20/2021 35 0 - 40 IU/L Final          Passed - HCT in normal range and within 180 days    Hematocrit  Date Value Ref Range Status  12/17/2020 42.5 34.0 - 46.6 % Final          Passed - HGB in normal range and within 180 days    Hemoglobin  Date Value Ref Range Status  12/17/2020 13.6 11.1 - 15.9 g/dL Final          Passed - PLT in normal range and within 180 days    Platelets  Date Value Ref Range Status  12/17/2020 237 150 - 450 x10E3/uL Final          Passed - Valid encounter within last 6 months    Recent Outpatient Visits           1 month ago Encounter for annual physical exam   [place], [name], NP   3 months ago Neuropathy   [place], [name], NP   4 months ago Primary hypertension   [place], [name], NP   11 months ago Essential hypertension   [place], [name], NP   1 year ago Onychomycosis   [place], [name], NP        Future Appointments             In 1 month [name], NP [place]

## 2021-05-15 ENCOUNTER — Encounter: Payer: Self-pay | Admitting: Family Medicine

## 2021-05-15 ENCOUNTER — Ambulatory Visit (INDEPENDENT_AMBULATORY_CARE_PROVIDER_SITE_OTHER): Payer: Medicare Other | Admitting: Family Medicine

## 2021-05-15 ENCOUNTER — Other Ambulatory Visit: Payer: Self-pay

## 2021-05-15 ENCOUNTER — Ambulatory Visit (HOSPITAL_BASED_OUTPATIENT_CLINIC_OR_DEPARTMENT_OTHER)
Admission: RE | Admit: 2021-05-15 | Discharge: 2021-05-15 | Disposition: A | Discharge status: Home / Self Care | Payer: Medicare Other | Source: Ambulatory Visit | Attending: Family Medicine | Admitting: Family Medicine

## 2021-05-15 VITALS — BP 130/58 | Ht 66.0 in | Wt 214.0 lb

## 2021-05-15 DIAGNOSIS — M47816 Spondylosis without myelopathy or radiculopathy, lumbar region: Secondary | ICD-10-CM | POA: Diagnosis not present

## 2021-05-15 DIAGNOSIS — M2578 Osteophyte, vertebrae: Secondary | ICD-10-CM | POA: Diagnosis not present

## 2021-05-15 DIAGNOSIS — M47819 Spondylosis without myelopathy or radiculopathy, site unspecified: Secondary | ICD-10-CM | POA: Insufficient documentation

## 2021-05-15 DIAGNOSIS — M47818 Spondylosis without myelopathy or radiculopathy, sacral and sacrococcygeal region: Secondary | ICD-10-CM | POA: Diagnosis not present

## 2021-05-15 DIAGNOSIS — M545 Low back pain, unspecified: Secondary | ICD-10-CM | POA: Diagnosis not present

## 2021-05-15 DIAGNOSIS — M5416 Radiculopathy, lumbar region: Secondary | ICD-10-CM | POA: Diagnosis not present

## 2021-05-15 DIAGNOSIS — E559 Vitamin D deficiency, unspecified: Secondary | ICD-10-CM

## 2021-05-15 DIAGNOSIS — M461 Sacroiliitis, not elsewhere classified: Secondary | ICD-10-CM

## 2021-05-15 DIAGNOSIS — M546 Pain in thoracic spine: Secondary | ICD-10-CM | POA: Diagnosis not present

## 2021-05-15 HISTORY — DX: Spondylosis without myelopathy or radiculopathy, site unspecified: M47.819

## 2021-05-15 HISTORY — DX: Vitamin D deficiency, unspecified: E55.9

## 2021-05-15 HISTORY — DX: Sacroiliitis, not elsewhere classified: M46.1

## 2021-05-15 HISTORY — DX: Spondylosis without myelopathy or radiculopathy, sacral and sacrococcygeal region: M47.818

## 2021-05-15 NOTE — Assessment & Plan Note (Signed)
Check vitamin D. 

## 2021-05-15 NOTE — Assessment & Plan Note (Signed)
Previous imaging has demonstrated degenerative changes of the SI joints.  Denies any personal history of family history that is contributory -Counseled on home exercise therapy and supportive care. -Unable to tolerate anti-inflammatories as they induce headaches. -Could consider SI joint injections

## 2021-05-15 NOTE — Progress Notes (Signed)
Brandy Roberts - 50 y.o. female MRN [id number]  Date of birth: [birth year]  SUBJECTIVE:  Including CC & ROS.  No chief complaint on file.   Brandy Roberts is a 50 y.o. female that is presenting with acute worsening of her back and radicular pain.  She has a history of what appears to be a compression fracture on previous imaging.  She has ongoing pain over the SI joints as well.  Has been started physical therapy.   Review of Systems See HPI   HISTORY: Past Medical, Surgical, Social, and Family History Reviewed & Updated per EMR.   Pertinent Historical Findings include:  Past Medical History:  Diagnosis Date   Anemia    Anxiety    Aortic regurgitation    Cardiomyopathy due to hypertension ([employer]) 10/23/2014   Chest discomfort 10/12/2018   Chronic ischemic vertebrobasilar artery thalamic stroke    Depression    Essential hypertension 05/02/2015   Former smoker 01/24/2015   H/O noncompliance with medical treatment, presenting hazards to health    Headache 05/01/2015   History of ischemic middle cerebral artery stroke embolic    Hyperlipidemia    Hypertension    Hypertensive heart disease    Migraine 05/21/2018   Migraines    Nonrheumatic aortic (valve) insufficiency    Osteoarthritis    Overweight (BMI 25.0-29.9)    Palpitations 10/12/2018   Sleep apnea    Snoring 05/02/2015   Stroke ([place]) 10/25/2016   Syncope and collapse 10/07/2018    Past Surgical History:  Procedure Laterality Date   CESAREAN SECTION     GANGLION CYST EXCISION     TEE WITHOUT CARDIOVERSION N/A 10/09/2014   Procedure: TRANSESOPHAGEAL ECHOCARDIOGRAM (TEE);  Surgeon: Sanda Klein, MD;  Location: [facility] ENDOSCOPY;  Service: Cardiovascular;  Laterality: N/A;   TONSILLECTOMY      Family History  Problem Relation Age of Onset   Cancer Mother        type unknown   Hypertension Mother    Hypertension Sister    Diabetes Sister    Cancer Maternal Aunt        4 aunts died of cancer types unknown     Social History   Socioeconomic History   Marital status: Single    Spouse name: Not on file   Number of children: 2   Years of education: 12   Highest education level: High school graduate  Occupational History   Occupation: disable   Tobacco Use   Smoking status: Former    Packs/day: 0.25    Types: Cigarettes    Quit date: 08/31/2017    Years since quitting: 3.7   Smokeless tobacco: Never  Vaping Use   Vaping Use: Never used  Substance and Sexual Activity   Alcohol use: No    Alcohol/week: 0.0 standard drinks   Drug use: Yes    Types: Marijuana   Sexual activity: Not on file  Other Topics Concern   Not on file  Social History Narrative   Patient lives with her uncle in a one story home.  Has 2 sons.  Currently on disability.  Education: high school.   Drinks 1-2 sodas a week       Social Determinants of Radio broadcast assistant Strain: Not on file  Food Insecurity: Not on file  Transportation Needs: Not on file  Physical Activity: Not on file  Stress: Not on file  Social Connections: Not on file  Intimate Partner Violence: Not on file  PHYSICAL EXAM:  VS: BP (!) 130/58 (BP Location: Right Arm, Patient Position: Sitting)    Ht 5\' 6"  (1.676 m)    Wt 214 lb (97.1 kg)    BMI 34.54 kg/m  Physical Exam Gen: NAD, alert, cooperative with exam, well-appearing    ASSESSMENT & PLAN:   Lumbar radiculopathy Continues to have pain that seems to be radicular component. -Counseled on home exercise therapy and supportive care. -May need to consider further imaging.  Facet arthropathy Has degenerative changes appreciated to the lumbar thoracic spine.  Likely contributing to the localized back pain.  Also appears to have changes consistent with a possible compression fracture in the thoracic spine. -Counseled on home exercise therapy and supportive care. -Continue physical therapy. -X-ray lumbar and thoracic today. -Could consider facet injections  SI joint  arthritis Previous imaging has demonstrated degenerative changes of the SI joints.  Denies any personal history of family history that is contributory -Counseled on home exercise therapy and supportive care. -Unable to tolerate anti-inflammatories as they induce headaches. -Could consider SI joint injections   Vitamin D deficiency Check vitamin D

## 2021-05-15 NOTE — Patient Instructions (Signed)
Good to see you Please try heat  Please continue with physical therapy  I will call with the results.   Please send me a message in MyChart with any questions or updates.  Please see me back in 4 weeks.   --Dr. Raeford Razor

## 2021-05-15 NOTE — Assessment & Plan Note (Signed)
Continues to have pain that seems to be radicular component. -Counseled on home exercise therapy and supportive care. -May need to consider further imaging.

## 2021-05-15 NOTE — Assessment & Plan Note (Addendum)
Has degenerative changes appreciated to the lumbar thoracic spine.  Likely contributing to the localized back pain.  Also appears to have changes consistent with a possible compression fracture in the thoracic spine. -Counseled on home exercise therapy and supportive care. -Continue physical therapy. -X-ray lumbar and thoracic today. -Could consider facet injections

## 2021-05-16 LAB — VITAMIN D 25 HYDROXY (VIT D DEFICIENCY, FRACTURES): Vit D, 25-Hydroxy: 14.7 ng/mL — ABNORMAL LOW (ref 30.0–100.0)

## 2021-05-17 ENCOUNTER — Telehealth: Payer: Self-pay | Admitting: Family Medicine

## 2021-05-17 ENCOUNTER — Other Ambulatory Visit: Payer: Self-pay

## 2021-05-17 ENCOUNTER — Ambulatory Visit: Payer: Medicare Other | Admitting: Physical Therapy

## 2021-05-17 DIAGNOSIS — M5416 Radiculopathy, lumbar region: Secondary | ICD-10-CM

## 2021-05-17 DIAGNOSIS — M5442 Lumbago with sciatica, left side: Secondary | ICD-10-CM | POA: Diagnosis not present

## 2021-05-17 DIAGNOSIS — M5441 Lumbago with sciatica, right side: Secondary | ICD-10-CM | POA: Diagnosis not present

## 2021-05-17 DIAGNOSIS — R2689 Other abnormalities of gait and mobility: Secondary | ICD-10-CM

## 2021-05-17 DIAGNOSIS — M6281 Muscle weakness (generalized): Secondary | ICD-10-CM | POA: Diagnosis not present

## 2021-05-17 DIAGNOSIS — R2681 Unsteadiness on feet: Secondary | ICD-10-CM | POA: Diagnosis not present

## 2021-05-17 DIAGNOSIS — R262 Difficulty in walking, not elsewhere classified: Secondary | ICD-10-CM

## 2021-05-17 DIAGNOSIS — M47819 Spondylosis without myelopathy or radiculopathy, site unspecified: Secondary | ICD-10-CM

## 2021-05-17 MED ORDER — VITAMIN D (ERGOCALCIFEROL) 1.25 MG (50000 UNIT) PO CAPS: 50000.0000 [IU] | ORAL_CAPSULE | ORAL | 0 refills | Status: DC

## 2021-05-17 NOTE — Telephone Encounter (Signed)
Informed of results.  We will send in vitamin D and referral to physical therapy.  Rosemarie Ax, MD [facility] 05/17/2021, 8:09 AM

## 2021-05-17 NOTE — Therapy (Signed)
[facility] [address]  [place], [place], [address] Phone: [phone]   Fax:  [phone]  Physical Therapy Treatment  Patient Details  Name: Brandy Roberts MRN: [id number] Date of Birth: [birth year] Referring Provider (PT): Clearance Coots   Encounter Date: 05/17/2021   PT End of Session - 05/17/21 0939     Visit Number 3    Number of Visits 12    Date for PT Re-Evaluation 06/18/21    Authorization Type UHC Medicare & Medicaid    PT Start Time 9485    PT Stop Time 1015    PT Time Calculation (min) 40 min    Activity Tolerance Patient tolerated treatment well;Patient limited by fatigue;Patient limited by pain    Behavior During Therapy [facility] for tasks assessed/performed             Past Medical History:  Diagnosis Date   Anemia    Anxiety    Aortic regurgitation    Cardiomyopathy due to hypertension ([place]) 10/23/2014   Chest discomfort 10/12/2018   Chronic ischemic vertebrobasilar artery thalamic stroke    Depression    Essential hypertension 05/02/2015   Former smoker 01/24/2015   H/O noncompliance with medical treatment, presenting hazards to health    Headache 05/01/2015   History of ischemic middle cerebral artery stroke embolic    Hyperlipidemia    Hypertension    Hypertensive heart disease    Migraine 05/21/2018   Migraines    Nonrheumatic aortic (valve) insufficiency    Osteoarthritis    Overweight (BMI 25.0-29.9)    Palpitations 10/12/2018   Sleep apnea    Snoring 05/02/2015   Stroke ([place]) 10/25/2016   Syncope and collapse 10/07/2018    Past Surgical History:  Procedure Laterality Date   CESAREAN SECTION     GANGLION CYST EXCISION     TEE WITHOUT CARDIOVERSION N/A 10/09/2014   Procedure: TRANSESOPHAGEAL ECHOCARDIOGRAM (TEE);  Surgeon: Sanda Klein, MD;  Location: [facility] ENDOSCOPY;  Service: Cardiovascular;  Laterality: N/A;   TONSILLECTOMY      There were no vitals filed for this  visit.   Subjective Assessment - 05/17/21 0939     Subjective Pt reports pain upon standing. States no issues with performing exercises.    Pertinent History stroke, OA, migraines, HTN, HLD, depression, anxiety, anemia, ganglion cyst excision    Limitations Standing;Walking;Lifting;House hold activities    How long can you sit comfortably? 1 hour    How long can you stand comfortably? 5 min    How long can you walk comfortably? can't walk that far, legs give out    Diagnostic tests 10/11/18 brain MRI: No acute intracranial abnormality.Unchanged small chronic infarcts.    Patient Stated Goals get better, not have pain    Currently in Pain? No/denies    Pain Score 0-No pain    Pain Location Back    Pain Orientation Lower                [facility] PT Assessment - 05/17/21 0001       Assessment   Medical Diagnosis M54.16 (ICD-10-CM) - Lumbar radiculopathy    Referring Provider (PT) Clearance Coots    Hand Dominance Right                           [facility] Adult PT Treatment/Exercise - 05/17/21 0001       Lumbar Exercises: Aerobic   Nustep L5 x 5  min      Lumbar Exercises: Seated   Other Seated Lumbar Exercises PPT x10    Other Seated Lumbar Exercises Pball forward flexion x10      Lumbar Exercises: Supine   Pelvic Tilt 10 reps    Pelvic Tilt Limitations with pillow press down on knees for ab set x3 sec    Bridge 20 reps      Knee/Hip Exercises: Seated   Sit to [employer] 5 reps      Modalities   Modalities Public librarian Stimulation Parameters To pt tolerance. Performed throughout treatment to allow improved pt tolerance    Electrical Stimulation Goals Tone;Pain      Manual Therapy   Manual Therapy Soft tissue mobilization    Soft tissue mobilization STM and TPR QL and lumbar paraspinals                        PT Short Term Goals - 05/09/21 1152       PT SHORT TERM GOAL #1   Title Patient to be independent with initial HEP.    Time 2    Period Weeks    Status On-going    Target Date 05/21/21               PT Long Term Goals - 05/09/21 1152       PT LONG TERM GOAL #1   Title Patient to be independent with advanced HEP.    Time 6    Period Weeks    Status On-going    Target Date 06/18/21      PT LONG TERM GOAL #2   Title Patient will be able to complete 5x STS with UE support in <30 seconds to demonstrate improved functional strength.    Baseline 52 seconds with UE support.    Time 6    Period Weeks    Status On-going    Target Date 06/18/21      PT LONG TERM GOAL #3   Title Patient will report 75% improvement in LBP/radicular symptoms.    Time 6    Period Weeks    Status On-going    Target Date 06/18/21      PT LONG TERM GOAL #4   Title Patient will tolerated standing/walking for 10 minutes without severe LBP/bil LE to perform ADLs    Baseline 10/10 pain after 5 min standing/walking    Time 6    Period Weeks    Status On-going    Target Date 06/18/21      PT LONG TERM GOAL #5   Title Patient will demonstrate improved physical functioning by improving FOTO score to 44.    Baseline 24    Time 6    Period Weeks    Status On-going    Target Date 06/18/21                   Plan - 05/17/21 1013     Clinical Impression Statement Treatment focused on reducing lumbar paraspinal tightness. Provided manual therapy, stretching and TENS to help address her pain. Continued work to improve neutral spine, increase LE strength and core activation. Pt easily fatigued. Good response to TENS.    Personal Factors and Comorbidities Comorbidity 3+;Transportation;Time since onset of injury/illness/exacerbation;Fitness;Past/Current Experience    Comorbidities stroke, OA, migraines, HTN,  HLD, depression, anxiety, anemia    PT Frequency 2x / week     PT Duration 6 weeks    PT Treatment/Interventions ADLs/Self Care Home Management;Cryotherapy;Electrical Stimulation;Moist Heat;Balance training;Therapeutic exercise;Therapeutic activities;Functional mobility training;Stair training;Gait training;Ultrasound;Neuromuscular re-education;Patient/family education;Manual techniques;Vasopneumatic Device;Taping;Passive range of motion;Spinal Manipulations;Joint Manipulations;Dry needling;Traction    PT Next Visit Plan review and progress neutral spine exercises, LE strengthening to build endurance    PT Home Exercise Plan Access Code: [email or web address]    Consulted and Agree with Plan of Care Patient             Patient will benefit from skilled therapeutic intervention in order to improve the following deficits and impairments:  Abnormal gait, Decreased knowledge of precautions, Decreased activity tolerance, Decreased strength, Decreased balance, Decreased mobility, Difficulty walking, Improper body mechanics, Decreased range of motion, Impaired flexibility, Postural dysfunction, Decreased endurance, Increased muscle spasms, Impaired tone, Decreased safety awareness, Increased fascial restricitons, Pain  Visit Diagnosis: Acute midline low back pain with bilateral sciatica  Difficulty in walking, not elsewhere classified  Muscle weakness (generalized)  Unsteadiness on feet  Other abnormalities of gait and mobility     Problem List Patient Active Problem List   Diagnosis Date Noted   Vitamin D deficiency 05/15/2021   Facet arthropathy 05/15/2021   SI joint arthritis 05/15/2021   Lumbar radiculopathy 05/01/2021   Obesity (BMI 35.0-39.9 without comorbidity) 11/26/2020   Mixed dyslipidemia 11/26/2020   Anemia    Anxiety    Depression    Hypertension    Migraines    Nonrheumatic aortic (valve) insufficiency    Osteoarthritis    Sleep apnea    Palpitations 10/12/2018   Chest discomfort 10/12/2018   Syncope and collapse 10/07/2018   Migraine  05/21/2018   Stroke ([place]) 10/25/2016   Snoring 05/02/2015   Essential hypertension 05/02/2015   Headache 05/01/2015   Former smoker 01/24/2015   Cardiomyopathy due to hypertension ([place]) 10/23/2014   Chronic ischemic vertebrobasilar artery thalamic stroke    Hypertensive heart disease    Overweight (BMI 25.0-29.9)    H/O noncompliance with medical treatment, presenting hazards to health    Aortic regurgitation    History of ischemic middle cerebral artery stroke embolic    Hyperlipidemia     [facility] Gordy Levan, PT, DPT 05/17/2021, 10:32 AM  [facility] [address]  [place], [place], [address] Phone: [phone]   Fax:  [phone]  Name: Brandy Roberts MRN: [id number] Date of Birth: July 31, 1970

## 2021-05-22 ENCOUNTER — Ambulatory Visit: Payer: Medicare Other | Admitting: Physical Therapy

## 2021-05-22 ENCOUNTER — Other Ambulatory Visit: Payer: Self-pay

## 2021-05-22 DIAGNOSIS — R2681 Unsteadiness on feet: Secondary | ICD-10-CM | POA: Diagnosis not present

## 2021-05-22 DIAGNOSIS — M6281 Muscle weakness (generalized): Secondary | ICD-10-CM

## 2021-05-22 DIAGNOSIS — M5416 Radiculopathy, lumbar region: Secondary | ICD-10-CM | POA: Diagnosis not present

## 2021-05-22 DIAGNOSIS — M5441 Lumbago with sciatica, right side: Secondary | ICD-10-CM | POA: Diagnosis not present

## 2021-05-22 DIAGNOSIS — R262 Difficulty in walking, not elsewhere classified: Secondary | ICD-10-CM | POA: Diagnosis not present

## 2021-05-22 DIAGNOSIS — M5442 Lumbago with sciatica, left side: Secondary | ICD-10-CM

## 2021-05-22 DIAGNOSIS — R2689 Other abnormalities of gait and mobility: Secondary | ICD-10-CM | POA: Diagnosis not present

## 2021-05-22 NOTE — Therapy (Signed)
[facility] [address]  [place], [place], [address] Phone: [phone]   Fax:  [phone]  Physical Therapy Treatment  Patient Details  Name: Brandy Roberts MRN: [id number] Date of Birth: [birth year] Referring Provider (PT): Clearance Coots   Encounter Date: 05/22/2021   PT End of Session - 05/22/21 0907     Visit Number 4    Number of Visits 12    Date for PT Re-Evaluation 06/18/21    Authorization Type UHC Medicare & Medicaid    PT Start Time 0845    PT Stop Time 0930    PT Time Calculation (min) 45 min    Activity Tolerance Patient tolerated treatment well;Patient limited by fatigue;Patient limited by pain    Behavior During Therapy [facility] for tasks assessed/performed             Past Medical History:  Diagnosis Date   Anemia    Anxiety    Aortic regurgitation    Cardiomyopathy due to hypertension ([place]) 10/23/2014   Chest discomfort 10/12/2018   Chronic ischemic vertebrobasilar artery thalamic stroke    Depression    Essential hypertension 05/02/2015   Former smoker 01/24/2015   H/O noncompliance with medical treatment, presenting hazards to health    Headache 05/01/2015   History of ischemic middle cerebral artery stroke embolic    Hyperlipidemia    Hypertension    Hypertensive heart disease    Migraine 05/21/2018   Migraines    Nonrheumatic aortic (valve) insufficiency    Osteoarthritis    Overweight (BMI 25.0-29.9)    Palpitations 10/12/2018   Sleep apnea    Snoring 05/02/2015   Stroke ([place]) 10/25/2016   Syncope and collapse 10/07/2018    Past Surgical History:  Procedure Laterality Date   CESAREAN SECTION     GANGLION CYST EXCISION     TEE WITHOUT CARDIOVERSION N/A 10/09/2014   Procedure: TRANSESOPHAGEAL ECHOCARDIOGRAM (TEE);  Surgeon: Sanda Klein, MD;  Location: [facility] ENDOSCOPY;  Service: Cardiovascular;  Laterality: N/A;   TONSILLECTOMY      There were no vitals filed for this  visit.   Subjective Assessment - 05/22/21 0908     Subjective Pt reports she was good until 2 days ago and then she had increased back pain. Pt states she has not been doing her exercises.    Pertinent History stroke, OA, migraines, HTN, HLD, depression, anxiety, anemia, ganglion cyst excision    Limitations Standing;Walking;Lifting;House hold activities    How long can you sit comfortably? 1 hour    How long can you stand comfortably? 5 min    How long can you walk comfortably? can't walk that far, legs give out    Diagnostic tests 10/11/18 brain MRI: No acute intracranial abnormality.Unchanged small chronic infarcts.    Patient Stated Goals get better, not have pain    Currently in Pain? No/denies                               [facility] Adult PT Treatment/Exercise - 05/22/21 0001       Lumbar Exercises: Stretches   Lower Trunk Rotation 3 reps;20 seconds    Other Lumbar Stretch Exercise Seated pball flexion and lateral flexion 2x20 sec      Lumbar Exercises: Seated   Other Seated Lumbar Exercises PPT x10      Lumbar Exercises: Supine   Pelvic Tilt 10 reps    Pelvic Tilt  Limitations with marching    Bridge 20 reps;Non-compliant    Bridge Limitations beginner bridge    Other Supine Lumbar Exercises SAQ 2x10 with 2#    Other Supine Lumbar Exercises scapular retraction 2x10      Electrical Stimulation   Electrical Stimulation Location bilat lumbar paraspinals    Electrical Stimulation Action Modulation    Electrical Stimulation Parameters to pt tolerance    Electrical Stimulation Goals Tone;Pain      Manual Therapy   Soft tissue mobilization STM and TPR QL and lumbar paraspinals                       PT Short Term Goals - 05/09/21 1152       PT SHORT TERM GOAL #1   Title Patient to be independent with initial HEP.    Time 2    Period Weeks    Status On-going    Target Date 05/21/21               PT Long Term Goals - 05/09/21 1152        PT LONG TERM GOAL #1   Title Patient to be independent with advanced HEP.    Time 6    Period Weeks    Status On-going    Target Date 06/18/21      PT LONG TERM GOAL #2   Title Patient will be able to complete 5x STS with UE support in <30 seconds to demonstrate improved functional strength.    Baseline 52 seconds with UE support.    Time 6    Period Weeks    Status On-going    Target Date 06/18/21      PT LONG TERM GOAL #3   Title Patient will report 75% improvement in LBP/radicular symptoms.    Time 6    Period Weeks    Status On-going    Target Date 06/18/21      PT LONG TERM GOAL #4   Title Patient will tolerated standing/walking for 10 minutes without severe LBP/bil LE to perform ADLs    Baseline 10/10 pain after 5 min standing/walking    Time 6    Period Weeks    Status On-going    Target Date 06/18/21      PT LONG TERM GOAL #5   Title Patient will demonstrate improved physical functioning by improving FOTO score to 44.    Baseline 24    Time 6    Period Weeks    Status On-going    Target Date 06/18/21                   Plan - 05/22/21 7169     Clinical Impression Statement Continued to focus on reducing QL and lumbar paraspinal overactivity and tightness with manual therapy, stretching and TENS. Initiated gentle thoracic spine stabilization and strengthening.    Personal Factors and Comorbidities Comorbidity 3+;Transportation;Time since onset of injury/illness/exacerbation;Fitness;Past/Current Experience    Comorbidities stroke, OA, migraines, HTN, HLD, depression, anxiety, anemia    PT Frequency 2x / week    PT Duration 6 weeks    PT Treatment/Interventions ADLs/Self Care Home Management;Cryotherapy;Electrical Stimulation;Moist Heat;Balance training;Therapeutic exercise;Therapeutic activities;Functional mobility training;Stair training;Gait training;Ultrasound;Neuromuscular re-education;Patient/family education;Manual techniques;Vasopneumatic  Device;Taping;Passive range of motion;Spinal Manipulations;Joint Manipulations;Dry needling;Traction    PT Next Visit Plan review and progress neutral spine exercises, LE strengthening to build endurance    PT Home Exercise Plan Access Code: [email or web address]    Consulted and Agree  with Plan of Care Patient             Patient will benefit from skilled therapeutic intervention in order to improve the following deficits and impairments:  Abnormal gait, Decreased knowledge of precautions, Decreased activity tolerance, Decreased strength, Decreased balance, Decreased mobility, Difficulty walking, Improper body mechanics, Decreased range of motion, Impaired flexibility, Postural dysfunction, Decreased endurance, Increased muscle spasms, Impaired tone, Decreased safety awareness, Increased fascial restricitons, Pain  Visit Diagnosis: Acute midline low back pain with bilateral sciatica  Difficulty in walking, not elsewhere classified  Muscle weakness (generalized)  Unsteadiness on feet  Other abnormalities of gait and mobility     Problem List Patient Active Problem List   Diagnosis Date Noted   Vitamin D deficiency 05/15/2021   Facet arthropathy 05/15/2021   SI joint arthritis 05/15/2021   Lumbar radiculopathy 05/01/2021   Obesity (BMI 35.0-39.9 without comorbidity) 11/26/2020   Mixed dyslipidemia 11/26/2020   Anemia    Anxiety    Depression    Hypertension    Migraines    Nonrheumatic aortic (valve) insufficiency    Osteoarthritis    Sleep apnea    Palpitations 10/12/2018   Chest discomfort 10/12/2018   Syncope and collapse 10/07/2018   Migraine 05/21/2018   Stroke ([place]) 10/25/2016   Snoring 05/02/2015   Essential hypertension 05/02/2015   Headache 05/01/2015   Former smoker 01/24/2015   Cardiomyopathy due to hypertension ([place]) 10/23/2014   Chronic ischemic vertebrobasilar artery thalamic stroke    Hypertensive heart disease    Overweight (BMI 25.0-29.9)    H/O  noncompliance with medical treatment, presenting hazards to health    Aortic regurgitation    History of ischemic middle cerebral artery stroke embolic    Hyperlipidemia     [facility] Gordy Levan, PT, DPT 05/22/2021, 12:09 PM  [facility] [address]  [place], [place], [address] Phone: [phone]   Fax:  [phone]  Name: Brandy Roberts MRN: [id number] Date of Birth: June 10, 1970

## 2021-05-24 ENCOUNTER — Ambulatory Visit: Payer: Medicare Other | Admitting: Physical Therapy

## 2021-05-30 ENCOUNTER — Ambulatory Visit: Payer: Medicare Other

## 2021-05-30 ENCOUNTER — Other Ambulatory Visit: Payer: Self-pay

## 2021-05-30 VITALS — BP 132/76

## 2021-05-30 DIAGNOSIS — R2681 Unsteadiness on feet: Secondary | ICD-10-CM | POA: Diagnosis not present

## 2021-05-30 DIAGNOSIS — R2689 Other abnormalities of gait and mobility: Secondary | ICD-10-CM | POA: Diagnosis not present

## 2021-05-30 DIAGNOSIS — M5442 Lumbago with sciatica, left side: Secondary | ICD-10-CM | POA: Diagnosis not present

## 2021-05-30 DIAGNOSIS — R262 Difficulty in walking, not elsewhere classified: Secondary | ICD-10-CM | POA: Diagnosis not present

## 2021-05-30 DIAGNOSIS — M6281 Muscle weakness (generalized): Secondary | ICD-10-CM | POA: Diagnosis not present

## 2021-05-30 DIAGNOSIS — M5441 Lumbago with sciatica, right side: Secondary | ICD-10-CM | POA: Diagnosis not present

## 2021-05-30 DIAGNOSIS — M5416 Radiculopathy, lumbar region: Secondary | ICD-10-CM | POA: Diagnosis not present

## 2021-05-30 NOTE — Therapy (Signed)
[facility] [address]  [place], [place], [address] Phone: [phone]   Fax:  [phone]  Physical Therapy Treatment  Patient Details  Name: Marin Wisner MRN: [id number] Date of Birth: [birth year] Referring Provider (PT): Clearance Coots   Encounter Date: 05/30/2021   PT End of Session - 05/30/21 1141     Visit Number 5    Number of Visits 12    Date for PT Re-Evaluation 06/18/21    Authorization Type UHC Medicare & Medicaid    PT Start Time 0932    PT Stop Time 1015    PT Time Calculation (min) 43 min    Activity Tolerance Patient tolerated treatment well;Patient limited by fatigue    Behavior During Therapy [facility] for tasks assessed/performed             Past Medical History:  Diagnosis Date   Anemia    Anxiety    Aortic regurgitation    Cardiomyopathy due to hypertension ([place]) 10/23/2014   Chest discomfort 10/12/2018   Chronic ischemic vertebrobasilar artery thalamic stroke    Depression    Essential hypertension 05/02/2015   Former smoker 01/24/2015   H/O noncompliance with medical treatment, presenting hazards to health    Headache 05/01/2015   History of ischemic middle cerebral artery stroke embolic    Hyperlipidemia    Hypertension    Hypertensive heart disease    Migraine 05/21/2018   Migraines    Nonrheumatic aortic (valve) insufficiency    Osteoarthritis    Overweight (BMI 25.0-29.9)    Palpitations 10/12/2018   Sleep apnea    Snoring 05/02/2015   Stroke ([place]) 10/25/2016   Syncope and collapse 10/07/2018    Past Surgical History:  Procedure Laterality Date   CESAREAN SECTION     GANGLION CYST EXCISION     TEE WITHOUT CARDIOVERSION N/A 10/09/2014   Procedure: TRANSESOPHAGEAL ECHOCARDIOGRAM (TEE);  Surgeon: Sanda Klein, MD;  Location: [place];  Service: Cardiovascular;  Laterality: N/A;   TONSILLECTOMY      Vitals:   05/30/21 0939  BP: 132/76     Subjective  Assessment - 05/30/21 0939     Subjective Pt reports that she has had L ankle pain since yesterday that has been keeping her from walking.    Pertinent History stroke, OA, migraines, HTN, HLD, depression, anxiety, anemia, ganglion cyst excision    Diagnostic tests 10/11/18 brain MRI: No acute intracranial abnormality.Unchanged small chronic infarcts.    Patient Stated Goals get better, not have pain    Currently in Pain? Yes    Pain Score 10-Worst pain ever    Pain Location Ankle    Pain Orientation Left                               [facility] Adult PT Treatment/Exercise - 05/30/21 0001       Lumbar Exercises: Stretches   Lower Trunk Rotation 3 reps;10 seconds    Pelvic Tilt 10 reps    Pelvic Tilt Limitations PPT in sitting      Lumbar Exercises: Standing   Other Standing Lumbar Exercises scap retraction 8 reps      Lumbar Exercises: Seated   Other Seated Lumbar Exercises trunk rotations 10x with red weighted ball    Other Seated Lumbar Exercises chest press with red weighted ball 10x      Lumbar Exercises: Supine   Bridge 10 reps  Bridge Limitations small range bridge      Knee/Hip Exercises: Aerobic   Nustep L2x71min, L4x50min      Knee/Hip Exercises: Seated   Ball Squeeze 10x3"    Marching AROM;Both;10 reps                       PT Short Term Goals - 05/30/21 1143       PT SHORT TERM GOAL #1   Title Patient to be independent with initial HEP.    Time 2    Period Weeks    Status On-going   pt denies compliance with HEP   Target Date 05/21/21               PT Long Term Goals - 05/09/21 1152       PT LONG TERM GOAL #1   Title Patient to be independent with advanced HEP.    Time 6    Period Weeks    Status On-going    Target Date 06/18/21      PT LONG TERM GOAL #2   Title Patient will be able to complete 5x STS with UE support in <30 seconds to demonstrate improved functional strength.    Baseline 52 seconds with UE  support.    Time 6    Period Weeks    Status On-going    Target Date 06/18/21      PT LONG TERM GOAL #3   Title Patient will report 75% improvement in LBP/radicular symptoms.    Time 6    Period Weeks    Status On-going    Target Date 06/18/21      PT LONG TERM GOAL #4   Title Patient will tolerated standing/walking for 10 minutes without severe LBP/bil LE to perform ADLs    Baseline 10/10 pain after 5 min standing/walking    Time 6    Period Weeks    Status On-going    Target Date 06/18/21      PT LONG TERM GOAL #5   Title Patient will demonstrate improved physical functioning by improving FOTO score to 44.    Baseline 24    Time 6    Period Weeks    Status On-going    Target Date 06/18/21                   Plan - 05/30/21 1142     Clinical Impression Statement Focus of today's session was on exercises to improve activity tolerance and decrease pain. She requested for me to take her BP mid session, her readings were fine and I continued to monitor her during the session. Pt becomes fatigued quickly, requiring lots of breaks in between exercises. Postural cues needed during session. She denied compliance with HEP.    Personal Factors and Comorbidities Comorbidity 3+;Transportation;Time since onset of injury/illness/exacerbation;Fitness;Past/Current Experience    Comorbidities stroke, OA, migraines, HTN, HLD, depression, anxiety, anemia    PT Frequency 2x / week    PT Duration 6 weeks    PT Treatment/Interventions ADLs/Self Care Home Management;Cryotherapy;Electrical Stimulation;Moist Heat;Balance training;Therapeutic exercise;Therapeutic activities;Functional mobility training;Stair training;Gait training;Ultrasound;Neuromuscular re-education;Patient/family education;Manual techniques;Vasopneumatic Device;Taping;Passive range of motion;Spinal Manipulations;Joint Manipulations;Dry needling;Traction    PT Next Visit Plan review and progress neutral spine exercises, LE  strengthening to build endurance    PT Home Exercise Plan Access Code: [email or web address]    Consulted and Agree with Plan of Care Patient             Patient  will benefit from skilled therapeutic intervention in order to improve the following deficits and impairments:  Abnormal gait, Decreased knowledge of precautions, Decreased activity tolerance, Decreased strength, Decreased balance, Decreased mobility, Difficulty walking, Improper body mechanics, Decreased range of motion, Impaired flexibility, Postural dysfunction, Decreased endurance, Increased muscle spasms, Impaired tone, Decreased safety awareness, Increased fascial restricitons, Pain  Visit Diagnosis: Acute midline low back pain with bilateral sciatica  Difficulty in walking, not elsewhere classified  Muscle weakness (generalized)  Unsteadiness on feet  Other abnormalities of gait and mobility     Problem List Patient Active Problem List   Diagnosis Date Noted   Vitamin D deficiency 05/15/2021   Facet arthropathy 05/15/2021   SI joint arthritis 05/15/2021   Lumbar radiculopathy 05/01/2021   Obesity (BMI 35.0-39.9 without comorbidity) 11/26/2020   Mixed dyslipidemia 11/26/2020   Anemia    Anxiety    Depression    Hypertension    Migraines    Nonrheumatic aortic (valve) insufficiency    Osteoarthritis    Sleep apnea    Palpitations 10/12/2018   Chest discomfort 10/12/2018   Syncope and collapse 10/07/2018   Migraine 05/21/2018   Stroke ([place]) 10/25/2016   Snoring 05/02/2015   Essential hypertension 05/02/2015   Headache 05/01/2015   Former smoker 01/24/2015   Cardiomyopathy due to hypertension ([place]) 10/23/2014   Chronic ischemic vertebrobasilar artery thalamic stroke    Hypertensive heart disease    Overweight (BMI 25.0-29.9)    H/O noncompliance with medical treatment, presenting hazards to health    Aortic regurgitation    History of ischemic middle cerebral artery stroke embolic    Hyperlipidemia      Artist Pais, PTA 05/30/2021, 11:44 AM  [facility] [address]  [place], [place], [address] Phone: [phone]   Fax:  [phone]  Name: Kaylanni Ezelle MRN: [id number] Date of Birth: 04-16-1971

## 2021-05-31 ENCOUNTER — Telehealth (INDEPENDENT_AMBULATORY_CARE_PROVIDER_SITE_OTHER): Payer: Self-pay | Admitting: Nurse Practitioner

## 2021-05-31 NOTE — Telephone Encounter (Signed)
Copied from [place] [id number]. Topic: General - Inquiry >> May 31, 2021  9:46 AM Brandy Roberts wrote: Reason for CRM: Pt called to see if a fax from [facility] was received. Pt stated the fax the was sent yesterday 05/30/21 regarding order for her diapers. Pt requests call back. Cb# [phone]

## 2021-06-04 ENCOUNTER — Emergency Department (HOSPITAL_BASED_OUTPATIENT_CLINIC_OR_DEPARTMENT_OTHER)
Admission: EM | Admit: 2021-06-04 | Discharge: 2021-06-04 | Disposition: A | Discharge status: Home / Self Care | Payer: Medicare Other | Attending: Emergency Medicine | Admitting: Emergency Medicine

## 2021-06-04 ENCOUNTER — Ambulatory Visit: Payer: Medicare Other | Admitting: Physical Therapy

## 2021-06-04 ENCOUNTER — Other Ambulatory Visit: Payer: Self-pay

## 2021-06-04 ENCOUNTER — Encounter (HOSPITAL_BASED_OUTPATIENT_CLINIC_OR_DEPARTMENT_OTHER): Payer: Self-pay | Admitting: Emergency Medicine

## 2021-06-04 ENCOUNTER — Encounter: Payer: Self-pay | Admitting: Physical Therapy

## 2021-06-04 VITALS — BP 138/78 | HR 82

## 2021-06-04 DIAGNOSIS — G43909 Migraine, unspecified, not intractable, without status migrainosus: Secondary | ICD-10-CM | POA: Diagnosis not present

## 2021-06-04 DIAGNOSIS — R519 Headache, unspecified: Secondary | ICD-10-CM | POA: Diagnosis present

## 2021-06-04 DIAGNOSIS — R2681 Unsteadiness on feet: Secondary | ICD-10-CM

## 2021-06-04 DIAGNOSIS — M5441 Lumbago with sciatica, right side: Secondary | ICD-10-CM

## 2021-06-04 DIAGNOSIS — M6281 Muscle weakness (generalized): Secondary | ICD-10-CM

## 2021-06-04 DIAGNOSIS — G43009 Migraine without aura, not intractable, without status migrainosus: Secondary | ICD-10-CM

## 2021-06-04 DIAGNOSIS — R262 Difficulty in walking, not elsewhere classified: Secondary | ICD-10-CM

## 2021-06-04 DIAGNOSIS — R2689 Other abnormalities of gait and mobility: Secondary | ICD-10-CM

## 2021-06-04 DIAGNOSIS — M5442 Lumbago with sciatica, left side: Secondary | ICD-10-CM | POA: Insufficient documentation

## 2021-06-04 MED ORDER — KETOROLAC TROMETHAMINE 15 MG/ML IJ SOLN
15.0000 mg | Freq: Once | INTRAMUSCULAR | Status: AC
Start: 2021-06-04 — End: 2021-06-04
  Administered 2021-06-04: 15 mg via INTRAVENOUS
  Filled 2021-06-04: qty 1

## 2021-06-04 MED ORDER — PROCHLORPERAZINE EDISYLATE 10 MG/2ML IJ SOLN
10.0000 mg | Freq: Once | INTRAMUSCULAR | Status: AC
  Administered 2021-06-04: 10 mg via INTRAVENOUS
  Filled 2021-06-04: qty 2

## 2021-06-04 MED ORDER — DEXAMETHASONE SODIUM PHOSPHATE 10 MG/ML IJ SOLN
10.0000 mg | Freq: Once | INTRAMUSCULAR | Status: AC
Start: 2021-06-04 — End: 2021-06-04
  Administered 2021-06-04: 10 mg via INTRAVENOUS
  Filled 2021-06-04: qty 1

## 2021-06-04 MED ORDER — SODIUM CHLORIDE 0.9 % IV BOLUS
1000.0000 mL | Freq: Once | INTRAVENOUS | Status: AC
Start: 2021-06-04 — End: 2021-06-04
  Administered 2021-06-04: 1000 mL via INTRAVENOUS

## 2021-06-04 NOTE — Telephone Encounter (Signed)
Fax received please allow 7-10 days to be filled out.

## 2021-06-04 NOTE — ED Triage Notes (Signed)
Pt woke with migraine-type HA this morning at 0800; denies other sxs; did not take migraine meds at home

## 2021-06-04 NOTE — Discharge Instructions (Signed)
Was a pleasure taking care of you today I am happy that your migraine is resolved at this time.  Please return to the emergency department if you have worsening headache, especially if you have blurry vision, weakness, numbness, or any stroke symptoms, especially with your history of stroke.

## 2021-06-04 NOTE — Therapy (Signed)
[facility] [address]  [place], [place], [address] Phone: [phone]   Fax:  [phone]  Patient Details  Name: Brandy Roberts MRN: [id number] Date of Birth: 12-Jun-1970 Referring Provider:  Rosemarie Ax, MD  Encounter Date: 06/04/2021  Patient arrived to clinic and in the lobby she reported increased fatigue x 2 days and difficulty getting up from her chair at home. She was able to stand independently with supervision and amb to the Nustep, but her gait was somewhat unsteady with her [facility]. I have never seen Ms. Miltenberger, so I am not sure if this is normal given her h/o stroke. She sat down at the NuStep and attempted to use it, but stopped right away and reported that she had a terrible headache. Vitals were taken BP 138/78, SpO2 95 and PR 82 bpm. Patient has a h/o migraines, but she said this was worse. Pt was given water on request and PT asked if she wanted to lie down or go downstairs to the ER and she elected to go to the ER. She transferred to a w/c with Min A and was transported down to the ER check in by me.   Jolleen Seman, PT 06/04/2021, 9:57 AM  [facility] [address]  [place], [place], [address] Phone: [phone]   Fax:  [phone]

## 2021-06-04 NOTE — ED Provider Notes (Signed)
[facility] EMERGENCY DEPARTMENT Provider Note   CSN: [id number] Arrival date & time: 06/04/21  1941     History  Chief Complaint  Patient presents with   Headache    Brandy Roberts is a 51 y.o. female with a past medical history significant for stroke with left-sided deficits still present, as well as migraine headaches for which she receives once a month injection, as well as takes preventative medications including venlafaxine, flurbiprofen at home who presents with frontal, worse on left side headache, with light sensitivity, noise sensitivity.  Patient denies any nausea, vomiting.  Patient reports the headache started gradually, worsened over the last day.  Patient reports that she has not tried anything for the headache at this time.  Patient reports that she was doing some physical therapy upstairs when she was told to come to the emergency department for further evaluation.  Patient reports that she has no visual deficits, no worsening weakness compared to her baseline.  Patient does report that she has had a little bit of a sore throat, cough recently.  Patient denies any recent sick contacts.  Patient does report that she has hot flashes on occasion, but has not noticed significantly fever, chills.   Headache Associated symptoms: photophobia       Home Medications Prior to Admission medications   Medication Sig Start Date End Date Taking? Authorizing Provider  amLODipine (NORVASC) 10 MG tablet TAKE 1 TABLET BY MOUTH EVERY DAY FOR BLOOD PRESSURE 03/19/21   [name], NP  atorvastatin (LIPITOR) 80 MG tablet TAKE 1 TABLET BY MOUTH EVERY DAY AT 6PM FOR CHOLESTEROL 04/11/21   Charlott Rakes, MD  busPIRone (BUSPAR) 10 MG tablet Take 1 tablet (10 mg total) by mouth 3 (three) times daily. 01/28/21   [name], NP  clopidogrel (PLAVIX) 75 MG tablet TAKE 1 TABLET BY MOUTH EVERY DAY 05/15/21   Charlott Rakes, MD  FLUoxetine (PROZAC) 10 MG tablet Take 10 mg by  mouth daily.    [provider]  flurbiprofen (ANSAID) 100 MG tablet 1 tablet every 8 hours as needed (maximum 3 tablets in 24 hours). 02/18/21   Pieter Partridge, DO  gabapentin (NEURONTIN) 600 MG tablet Take 1 tablet (600 mg total) by mouth 3 (three) times daily. 01/28/21 04/28/21  [name], NP  Galcanezumab-gnlm 120 MG/ML SOAJ Inject 120 mg into the skin every 30 (thirty) days. 01/15/21   Tomi Likens, Adam R, DO  hydrALAZINE (APRESOLINE) 50 MG tablet TAKE 1 TABLET BY MOUTH 3 TIMES DAILY 04/23/21   [name], NP  hydrochlorothiazide (MICROZIDE) 12.5 MG capsule TAKE 1 CAPSULE BY MOUTH DAILY 04/23/21   [name], NP  HYDROcodone-acetaminophen (NORCO/VICODIN) 5-325 MG tablet Take 1 tablet by mouth every 8 (eight) hours as needed. 05/01/21   Rosemarie Ax, MD  hydrOXYzine (ATARAX/VISTARIL) 50 MG tablet Take 50 mg by mouth 3 (three) times daily as needed for anxiety or sleep.    [provider]  lisinopril (ZESTRIL) 10 MG tablet Take 10 mg by mouth daily.    [provider]  methocarbamol (ROBAXIN) 500 MG tablet Take 1 tablet (500 mg total) by mouth 2 (two) times daily. 04/30/21   Yazhini Mcaulay H, PA-C  metoCLOPramide (REGLAN) 10 MG tablet Take 10 mg by mouth every 6 (six) hours as needed for nausea or headache.    [provider]  metoprolol tartrate (LOPRESSOR) 25 MG tablet TAKE 1 TABLET BY MOUTH 2 TIMES A DAY 04/23/21  [name], NP  mupirocin ointment (BACTROBAN) 2 % Apply 1 application topically 2 (two) times daily. 05/21/20   [name], NP  nitroGLYCERIN (NITROSTAT) 0.4 MG SL tablet Place 0.4 mg under the tongue every 5 (five) minutes as needed for chest pain.    [provider]  pantoprazole (PROTONIX) 20 MG tablet TAKE 1 TABLET BY MOUTH EVERY DAY FOR ACID REFLUX 03/19/21   [name], NP  predniSONE (STERAPRED UNI-PAK 21 TAB) 10 MG (21) TBPK tablet Take by mouth daily. Take 6 tabs by mouth daily  for 2 days,  then 5 tabs for 2 days, then 4 tabs for 2 days, then 3 tabs for 2 days, 2 tabs for 2 days, then 1 tab by mouth daily for 2 days 04/30/21   Malya Cirillo H, PA-C  spironolactone (ALDACTONE) 25 MG tablet TAKE 1 TABLET BY MOUTH EVERY DAY 03/19/21   [name], NP  traZODone (DESYREL) 100 MG tablet TAKE 1 TABLET BY MOUTH EVERY NIGHT AT BEDTIME 03/06/21   Charlott Rakes, MD  venlafaxine XR (EFFEXOR-XR) 75 MG 24 hr capsule Take 3 capsules (225 mg total) by mouth daily with breakfast. 02/18/21   Pieter Partridge, DO  Vitamin D, Ergocalciferol, (DRISDOL) 1.25 MG (50000 UNIT) CAPS capsule Take 1 capsule (50,000 Units total) by mouth every 7 (seven) days. Take for 8 total doses(weeks) 05/17/21   Rosemarie Ax, MD      Allergies    Ibuprofen and Tylenol [acetaminophen]    Review of Systems   Review of Systems  Eyes:  Positive for photophobia.  Neurological:  Positive for headaches.  All other systems reviewed and are negative.  Physical Exam Updated Vital Signs BP 119/79    Pulse 81    Temp 99.8 F (37.7 C) (Oral)    Resp 15    Ht 5\' 6"  (1.676 m)    Wt 97.5 kg    SpO2 95%    BMI 34.70 kg/m  Physical Exam Vitals and nursing note reviewed.  Constitutional:      General: She is not in acute distress.    Appearance: Normal appearance.  HENT:     Head: Normocephalic and atraumatic.  Eyes:     General:        Right eye: No discharge.        Left eye: No discharge.  Cardiovascular:     Rate and Rhythm: Normal rate and regular rhythm.     Heart sounds: No murmur heard.   No friction rub. No gallop.  Pulmonary:     Effort: Pulmonary effort is normal.     Breath sounds: Normal breath sounds.  Abdominal:     General: Bowel sounds are normal.     Palpations: Abdomen is soft.  Skin:    General: Skin is warm and dry.     Capillary Refill: Capillary refill takes less than 2 seconds.  Neurological:     Mental Status: She is alert and oriented to person, place, and time.     GCS: GCS  eye subscore is 4. GCS verbal subscore is 5. GCS motor subscore is 6.     Cranial Nerves: No cranial nerve deficit.     Comments: Patient is alert and oriented x3.  She has left-sided weakness compared to the right to flexion and extension of upper and lower extremities, patient does have stroke deficits at baseline, she reports that this weakness is baseline for her she does not feel more weak  than normal.  Patient has intact finger-nose.  Patient reports her gait is baseline for her.  Patient does not appear confused.  Pupils are equal round reactive to light.  Psychiatric:        Mood and Affect: Mood normal.        Behavior: Behavior normal.    ED Results / Procedures / Treatments   Labs (all labs ordered are listed, but only abnormal results are displayed) Labs Reviewed  RESP PANEL BY RT-PCR (FLU A&B, COVID) [id number]    EKG None  Radiology No results found.  Procedures Procedures    Medications Ordered in ED Medications  sodium chloride 0.9 % bolus 1,000 mL (1,000 mLs Intravenous New Bag/Given 06/04/21 1212)  ketorolac (TORADOL) 15 MG/ML injection 15 mg (15 mg Intravenous Given 06/04/21 1213)  prochlorperazine (COMPAZINE) injection 10 mg (10 mg Intravenous Given 06/04/21 1213)  dexamethasone (DECADRON) injection 10 mg (10 mg Intravenous Given 06/04/21 1213)    ED Course/ Medical Decision Making/ A&P                           Medical Decision Making  This patient presents to the ED for concern of migraine headache with light sensitivity, noise sensitivity, this involves an extensive number of treatment options, and is a complaint that carries with it a high risk of complications and morbidity.  The differential diagnosis includes to a vascular injury, TIA, migraine type headache, dural venous thrombosis   Co morbidities that complicate the patient evaluation  History of stroke with baseline left-sided deficits, history of severe migraines  Lab Tests:  I Ordered, and  personally interpreted labs.  The pertinent results include: Respiratory virus panel was declined by patient. Discussed she does have an elevated temperature without true fever, I recommend that she continue to monitor for signs of developing illness.  Test Considered:  CT head without contrast to evaluate for intracranial abnormality in context of previous stroke, however patient has no focal neurologic deficits report this feels like her normal migraines, she does not have any red flag symptoms, headache was gradual in onset.   Critical Interventions:  Administered migraine cocktail consisting of IV fluid bolus, Decadron, Toradol, Compazine.   Problem List / ED Course:  Simple migraine with no aura, without status migrainosus.  Headache has been present for less than 24 hours.  Patient has no focal neurologic deficits.   Reevaluation:  After the interventions noted above, I reevaluated the patient and found that they have :resolved.  Reports significant improvement of her headache symptoms at reevaluation, reports that she feels back to her normal self, and requests discharge at this time.   Social Determinants of Health:  Needs help with transportation, reports that she normally has arranged transport to her physical therapy appointments.   Disposition:  After consideration of the diagnostic results and the patients response to treatment, I feel that the patient would benefit from follow-up with primary care doctor for discussion of current migraine control.  At this time I feel like she is different discharge, she continues to have no focal neurologic deficits, and she had improvement of her headache with migraine cocktail.  Discharged in stable condition at this time, return precautions given.   Final Clinical Impression(s) / ED Diagnoses Final diagnoses:  Migraine without aura and without status migrainosus, not intractable    Rx / DC Orders ED Discharge Orders     None  Dorien Chihuahua 06/04/21 1320    Gareth Morgan, MD 06/04/21 2235

## 2021-06-06 ENCOUNTER — Ambulatory Visit: Payer: Medicare Other | Attending: Family Medicine

## 2021-06-06 ENCOUNTER — Other Ambulatory Visit: Payer: Self-pay

## 2021-06-06 DIAGNOSIS — M6281 Muscle weakness (generalized): Secondary | ICD-10-CM

## 2021-06-06 DIAGNOSIS — M5442 Lumbago with sciatica, left side: Secondary | ICD-10-CM

## 2021-06-06 DIAGNOSIS — M5441 Lumbago with sciatica, right side: Secondary | ICD-10-CM

## 2021-06-06 DIAGNOSIS — R2681 Unsteadiness on feet: Secondary | ICD-10-CM

## 2021-06-06 DIAGNOSIS — R262 Difficulty in walking, not elsewhere classified: Secondary | ICD-10-CM | POA: Diagnosis not present

## 2021-06-06 DIAGNOSIS — R2689 Other abnormalities of gait and mobility: Secondary | ICD-10-CM | POA: Diagnosis not present

## 2021-06-06 NOTE — Therapy (Signed)
[facility] [address]  [place], [place], [address] Phone: [phone]   Fax:  [phone]  Physical Therapy Treatment  Patient Details  Name: Brandy Roberts MRN: [id number] Date of Birth: Oct 14, 1970 Referring Provider (PT): Clearance Coots   Encounter Date: 06/06/2021   PT End of Session - 06/06/21 1114     Visit Number 7    Number of Visits 12    Date for PT Re-Evaluation 06/18/21    Authorization Type UHC Medicare & Medicaid    PT Start Time 0939    PT Stop Time 1017    PT Time Calculation (min) 38 min    Activity Tolerance Patient tolerated treatment well    Behavior During Therapy [facility] for tasks assessed/performed             Past Medical History:  Diagnosis Date   Anemia    Anxiety    Aortic regurgitation    Cardiomyopathy due to hypertension ([place]) 10/23/2014   Chest discomfort 10/12/2018   Chronic ischemic vertebrobasilar artery thalamic stroke    Depression    Essential hypertension 05/02/2015   Former smoker 01/24/2015   H/O noncompliance with medical treatment, presenting hazards to health    Headache 05/01/2015   History of ischemic middle cerebral artery stroke embolic    Hyperlipidemia    Hypertension    Hypertensive heart disease    Migraine 05/21/2018   Migraines    Nonrheumatic aortic (valve) insufficiency    Osteoarthritis    Overweight (BMI 25.0-29.9)    Palpitations 10/12/2018   Sleep apnea    Snoring 05/02/2015   Stroke ([place]) 10/25/2016   Syncope and collapse 10/07/2018    Past Surgical History:  Procedure Laterality Date   CESAREAN SECTION     GANGLION CYST EXCISION     TEE WITHOUT CARDIOVERSION N/A 10/09/2014   Procedure: TRANSESOPHAGEAL ECHOCARDIOGRAM (TEE);  Surgeon: Sanda Klein, MD;  [facility] ENDOSCOPY;  Service: Cardiovascular;  Laterality: N/A;   TONSILLECTOMY      There were no vitals filed for this visit.   Subjective Assessment - 06/06/21 0944      Subjective Pt reports that her mobility around the is about the same but pain levels have improved.    Pertinent History stroke, OA, migraines, HTN, HLD, depression, anxiety, anemia, ganglion cyst excision    Diagnostic tests 10/11/18 brain MRI: No acute intracranial abnormality.Unchanged small chronic infarcts.    Patient Stated Goals get better, not have pain    Currently in Pain? No/denies                               [facility] Adult PT Treatment/Exercise - 06/06/21 0001       Transfers   Five time sit to stand comments  13 sec w/o UE support      Lumbar Exercises: Aerobic   Nustep L4x9      Lumbar Exercises: Standing   Heel Raises 10 reps    Other Standing Lumbar Exercises church pews 15 reps    Other Standing Lumbar Exercises retro steps with alt LE in front 15 reps      Lumbar Exercises: Seated   Sit to Stand 5 reps    Other Seated Lumbar Exercises OHP with 1lb weights 12 reps     Knee/Hip Exercises: Seated   Ball Squeeze 20 reps  PT Short Term Goals - 05/30/21 1143       PT SHORT TERM GOAL #1   Title Patient to be independent with initial HEP.    Time 2    Period Weeks    Status On-going   pt denies compliance with HEP   Target Date 05/21/21               PT Long Term Goals - 06/06/21 1014       PT LONG TERM GOAL #1   Title Patient to be independent with advanced HEP.    Time 6    Period Weeks    Status On-going    Target Date 06/18/21      PT LONG TERM GOAL #2   Title Patient will be able to complete 5x STS with UE support in <30 seconds to demonstrate improved functional strength.    Baseline 52 seconds with UE support.    Time 6    Period Weeks    Status Achieved   13.07 sec w/o UE support   Target Date 06/18/21      PT LONG TERM GOAL #3   Title Patient will report 75% improvement in LBP/radicular symptoms.    Time 6    Period Weeks    Status On-going    Target Date 06/18/21      PT  LONG TERM GOAL #4   Title Patient will tolerated standing/walking for 10 minutes without severe LBP/bil LE to perform ADLs    Baseline 10/10 pain after 5 min standing/walking    Time 6    Period Weeks    Status On-going    Target Date 06/18/21      PT LONG TERM GOAL #5   Title Patient will demonstrate improved physical functioning by improving FOTO score to 44.    Baseline 24    Time 6    Period Weeks    Status On-going    Target Date 06/18/21                   Plan - 06/06/21 1048     Clinical Impression Statement Pt still is mostly limited by fatigue. We worked more in [employer] positions to establish lumbopelvic control and to improve functional hip strength. After the exercises she improved her 5x STS to 13.07 sec w/o UE support. She has met LTG 2, making objective improvements in functional strength and pain levels in her lower back.    Personal Factors and Comorbidities Comorbidity 3+;Transportation;Time since onset of injury/illness/exacerbation;Fitness;Past/Current Experience    Comorbidities stroke, OA, migraines, HTN, HLD, depression, anxiety, anemia    PT Frequency 2x / week    PT Duration 6 weeks    PT Treatment/Interventions ADLs/Self Care Home Management;Cryotherapy;Electrical Stimulation;Moist Heat;Balance training;Therapeutic exercise;Therapeutic activities;Functional mobility training;Stair training;Gait training;Ultrasound;Neuromuscular re-education;Patient/family education;Manual techniques;Vasopneumatic Device;Taping;Passive range of motion;Spinal Manipulations;Joint Manipulations;Dry needling;Traction    PT Next Visit Plan seated exercises for comfort; WB exercises to help with gait    PT Home Exercise Plan Access Code: [email or web address]    Consulted and Agree with Plan of Care Patient             Patient will benefit from skilled therapeutic intervention in order to improve the following deficits and impairments:  Abnormal gait, Decreased knowledge of precautions,  Decreased activity tolerance, Decreased strength, Decreased balance, Decreased mobility, Difficulty walking, Improper body mechanics, Decreased range of motion, Impaired flexibility, Postural dysfunction, Decreased endurance, Increased muscle spasms, Impaired tone, Decreased safety awareness, Increased fascial restricitons,  Pain  Visit Diagnosis: Acute midline low back pain with bilateral sciatica  Difficulty in walking, not elsewhere classified  Muscle weakness (generalized)  Unsteadiness on feet  Other abnormalities of gait and mobility     Problem List Patient Active Problem List   Diagnosis Date Noted   Vitamin D deficiency 05/15/2021   Facet arthropathy 05/15/2021   SI joint arthritis 05/15/2021   Lumbar radiculopathy 05/01/2021   Obesity (BMI 35.0-39.9 without comorbidity) 11/26/2020   Mixed dyslipidemia 11/26/2020   Anemia    Anxiety    Depression    Hypertension    Migraines    Nonrheumatic aortic (valve) insufficiency    Osteoarthritis    Sleep apnea    Palpitations 10/12/2018   Chest discomfort 10/12/2018   Syncope and collapse 10/07/2018   Migraine 05/21/2018   Stroke ([place]) 10/25/2016   Snoring 05/02/2015   Essential hypertension 05/02/2015   Headache 05/01/2015   Former smoker 01/24/2015   Cardiomyopathy due to hypertension ([employer]) 10/23/2014   Chronic ischemic vertebrobasilar artery thalamic stroke    Hypertensive heart disease    Overweight (BMI 25.0-29.9)    H/O noncompliance with medical treatment, presenting hazards to health    Aortic regurgitation    History of ischemic middle cerebral artery stroke embolic    Hyperlipidemia     Artist Pais, PTA 06/06/2021, 12:12 PM  [facility] [address]  [place], [place], [address] Phone: [phone]   Fax:  [phone]  Name: Brandy Roberts MRN: [id number] Date of Birth: May 15, 1971

## 2021-06-10 ENCOUNTER — Other Ambulatory Visit: Payer: Self-pay

## 2021-06-10 ENCOUNTER — Ambulatory Visit: Payer: Medicare Other

## 2021-06-10 DIAGNOSIS — M5441 Lumbago with sciatica, right side: Secondary | ICD-10-CM

## 2021-06-10 DIAGNOSIS — M6281 Muscle weakness (generalized): Secondary | ICD-10-CM | POA: Diagnosis not present

## 2021-06-10 DIAGNOSIS — R2689 Other abnormalities of gait and mobility: Secondary | ICD-10-CM

## 2021-06-10 DIAGNOSIS — R2681 Unsteadiness on feet: Secondary | ICD-10-CM

## 2021-06-10 DIAGNOSIS — R262 Difficulty in walking, not elsewhere classified: Secondary | ICD-10-CM

## 2021-06-10 DIAGNOSIS — M5442 Lumbago with sciatica, left side: Secondary | ICD-10-CM | POA: Diagnosis not present

## 2021-06-10 NOTE — Patient Instructions (Signed)
Access Code: [email or web address] URL: [email or web address] Date: 06/10/2021 Prepared by: Clarene Essex  Exercises Supine Posterior Pelvic Tilt - 1 x daily - 7 x weekly - 2 sets - 10 reps Supine Lower Trunk Rotation - 1 x daily - 7 x weekly - 1 sets - 10 reps Supine Bridge - 1 x daily - 7 x weekly - 2 sets - 10 reps Sit to Stand with Counter Support - 1 x daily - 7 x weekly - 2 sets - 10 reps Seated Flexion Stretch with Swiss Ball - 1 x daily - 7 x weekly - 1 sets - 10 reps - 5 sec hold Seated March - 1 x daily - 7 x weekly - 2 sets - 10 reps Seated Shoulder Horizontal Abduction with Resistance - 1 x daily - 7 x weekly - 3 sets - 10 reps

## 2021-06-10 NOTE — Therapy (Signed)
[facility] [address]  [place], [place], [address] Phone: [phone]   Fax:  [phone]  Physical Therapy Treatment  Patient Details  Name: Brandy Roberts MRN: [id number] Date of Birth: July 17, 1970 Referring Provider (PT): Clearance Coots   Encounter Date: 06/10/2021   PT End of Session - 06/10/21 0938     Visit Number 8    Number of Visits 12    Date for PT Re-Evaluation 06/18/21    Authorization Type UHC Medicare & Medicaid    PT Start Time 0845    PT Stop Time 0928    PT Time Calculation (min) 43 min    Activity Tolerance Patient tolerated treatment well    Behavior During Therapy [facility] for tasks assessed/performed             Past Medical History:  Diagnosis Date   Anemia    Anxiety    Aortic regurgitation    Cardiomyopathy due to hypertension ([place]) 10/23/2014   Chest discomfort 10/12/2018   Chronic ischemic vertebrobasilar artery thalamic stroke    Depression    Essential hypertension 05/02/2015   Former smoker 01/24/2015   H/O noncompliance with medical treatment, presenting hazards to health    Headache 05/01/2015   History of ischemic middle cerebral artery stroke embolic    Hyperlipidemia    Hypertension    Hypertensive heart disease    Migraine 05/21/2018   Migraines    Nonrheumatic aortic (valve) insufficiency    Osteoarthritis    Overweight (BMI 25.0-29.9)    Palpitations 10/12/2018   Sleep apnea    Snoring 05/02/2015   Stroke ([place]) 10/25/2016   Syncope and collapse 10/07/2018    Past Surgical History:  Procedure Laterality Date   CESAREAN SECTION     GANGLION CYST EXCISION     TEE WITHOUT CARDIOVERSION N/A 10/09/2014   Procedure: TRANSESOPHAGEAL ECHOCARDIOGRAM (TEE);  Surgeon: Sanda Klein, MD;  Location: [facility] ENDOSCOPY;  Service: Cardiovascular;  Laterality: N/A;   TONSILLECTOMY      There were no vitals filed for this visit.   Subjective Assessment - 06/10/21 0845      Subjective Pt reports that she felt great after the last session, just having a little cold this morning.    Pertinent History stroke, OA, migraines, HTN, HLD, depression, anxiety, anemia, ganglion cyst excision    Diagnostic tests 10/11/18 brain MRI: No acute intracranial abnormality.Unchanged small chronic infarcts.    Patient Stated Goals get better, not have pain    Currently in Pain? No/denies                               [facility] Adult PT Treatment/Exercise - 06/10/21 0001       Lumbar Exercises: Aerobic   Nustep L5x27min      Lumbar Exercises: Machines for Strengthening   Other Lumbar Machine Exercise seated row 15lb 2x10      Lumbar Exercises: Standing   Heel Raises 20 reps    Heel Raises Limitations UE support    Other Standing Lumbar Exercises OHP with 1lb weight 5 reps, limited by fatigue    Other Standing Lumbar Exercises retro steps with alt LE in front 15 reps      Lumbar Exercises: Seated   Other Seated Lumbar Exercises B shoulder ER and horizontal ABD with yellow TB 10x    Other Seated Lumbar Exercises OHP with 1lb 10 reps  Knee/Hip Exercises: Standing   Hip Abduction Stengthening;Both;5 reps;Knee straight    Abduction Limitations UE support      Knee/Hip Exercises: Seated   Marching AROM;Both;10 reps;2 sets    Marching Limitations --                     PT Education - 06/10/21 0938     Education Details HEP update    Person(s) Educated Patient    Methods Explanation;Demonstration    Comprehension Verbalized understanding;Returned demonstration              PT Short Term Goals - 05/30/21 1143       PT SHORT TERM GOAL #1   Title Patient to be independent with initial HEP.    Time 2    Period Weeks    Status On-going   pt denies compliance with HEP   Target Date 05/21/21               PT Long Term Goals - 06/06/21 1014       PT LONG TERM GOAL #1   Title Patient to be independent with advanced HEP.     Time 6    Period Weeks    Status On-going    Target Date 06/18/21      PT LONG TERM GOAL #2   Title Patient will be able to complete 5x STS with UE support in <30 seconds to demonstrate improved functional strength.    Baseline 52 seconds with UE support.    Time 6    Period Weeks    Status Achieved   13.07 sec w/o UE support   Target Date 06/18/21      PT LONG TERM GOAL #3   Title Patient will report 75% improvement in LBP/radicular symptoms.    Time 6    Period Weeks    Status On-going    Target Date 06/18/21      PT LONG TERM GOAL #4   Title Patient will tolerated standing/walking for 10 minutes without severe LBP/bil LE to perform ADLs    Baseline 10/10 pain after 5 min standing/walking    Time 6    Period Weeks    Status On-going    Target Date 06/18/21      PT LONG TERM GOAL #5   Title Patient will demonstrate improved physical functioning by improving FOTO score to 44.    Baseline 24    Time 6    Period Weeks    Status On-going    Target Date 06/18/21                   Plan - 06/10/21 0942     Clinical Impression Statement Pt continues to be limited by fatigue with all exercises, requiring a lot of rest. We incorporated more postural exercises today and tried to do WB exercises w/o UE support but she was quickly fatigued. She requires constant encouragement to continue with exercises ot work on more muscular endurance. Cues required with most exercises for form and technique. She continues making slow progress.    Personal Factors and Comorbidities Comorbidity 3+;Transportation;Time since onset of injury/illness/exacerbation;Fitness;Past/Current Experience    Comorbidities stroke, OA, migraines, HTN, HLD, depression, anxiety, anemia    PT Frequency 2x / week    PT Duration 6 weeks    PT Treatment/Interventions ADLs/Self Care Home Management;Cryotherapy;Electrical Stimulation;Moist Heat;Balance training;Therapeutic exercise;Therapeutic activities;Functional  mobility training;Stair training;Gait training;Ultrasound;Neuromuscular re-education;Patient/family education;Manual techniques;Vasopneumatic Device;Taping;Passive range of motion;Spinal Manipulations;Joint Manipulations;Dry  needling;Traction    PT Next Visit Plan seated exercises for comfort; WB exercises to help with gait    PT Home Exercise Plan Access Code: [email or web address]    Consulted and Agree with Plan of Care Patient             Patient will benefit from skilled therapeutic intervention in order to improve the following deficits and impairments:  Abnormal gait, Decreased knowledge of precautions, Decreased activity tolerance, Decreased strength, Decreased balance, Decreased mobility, Difficulty walking, Improper body mechanics, Decreased range of motion, Impaired flexibility, Postural dysfunction, Decreased endurance, Increased muscle spasms, Impaired tone, Decreased safety awareness, Increased fascial restricitons, Pain  Visit Diagnosis: Acute midline low back pain with bilateral sciatica  Difficulty in walking, not elsewhere classified  Muscle weakness (generalized)  Unsteadiness on feet  Other abnormalities of gait and mobility     Problem List Patient Active Problem List   Diagnosis Date Noted   Vitamin D deficiency 05/15/2021   Facet arthropathy 05/15/2021   SI joint arthritis 05/15/2021   Lumbar radiculopathy 05/01/2021   Obesity (BMI 35.0-39.9 without comorbidity) 11/26/2020   Mixed dyslipidemia 11/26/2020   Anemia    Anxiety    Depression    Hypertension    Migraines    Nonrheumatic aortic (valve) insufficiency    Osteoarthritis    Sleep apnea    Palpitations 10/12/2018   Chest discomfort 10/12/2018   Syncope and collapse 10/07/2018   Migraine 05/21/2018   Stroke ([place]) 10/25/2016   Snoring 05/02/2015   Essential hypertension 05/02/2015   Headache 05/01/2015   Former smoker 01/24/2015   Cardiomyopathy due to hypertension ([place]) 10/23/2014   Chronic  ischemic vertebrobasilar artery thalamic stroke    Hypertensive heart disease    Overweight (BMI 25.0-29.9)    H/O noncompliance with medical treatment, presenting hazards to health    Aortic regurgitation    History of ischemic middle cerebral artery stroke embolic    Hyperlipidemia     Artist Pais, PTA 06/10/2021, 12:12 PM  [facility] [address]  [place], [place], [address] Phone: [phone]   Fax:  [phone]  Name: Brandy Roberts MRN: [id number] Date of Birth: April 22, 1971

## 2021-06-12 ENCOUNTER — Ambulatory Visit: Payer: Medicare Other | Admitting: Physical Therapy

## 2021-06-12 ENCOUNTER — Other Ambulatory Visit: Payer: Self-pay

## 2021-06-12 ENCOUNTER — Encounter: Payer: Self-pay | Admitting: Physical Therapy

## 2021-06-12 DIAGNOSIS — R262 Difficulty in walking, not elsewhere classified: Secondary | ICD-10-CM

## 2021-06-12 DIAGNOSIS — M6281 Muscle weakness (generalized): Secondary | ICD-10-CM

## 2021-06-12 DIAGNOSIS — M5441 Lumbago with sciatica, right side: Secondary | ICD-10-CM

## 2021-06-12 DIAGNOSIS — M5442 Lumbago with sciatica, left side: Secondary | ICD-10-CM

## 2021-06-12 DIAGNOSIS — R2681 Unsteadiness on feet: Secondary | ICD-10-CM | POA: Diagnosis not present

## 2021-06-12 DIAGNOSIS — R2689 Other abnormalities of gait and mobility: Secondary | ICD-10-CM | POA: Diagnosis not present

## 2021-06-12 NOTE — Therapy (Signed)
[facility] [address]   [place], [place], [address] Phone: [phone]   Fax:  [phone]  Physical Therapy Treatment Progress Note Reporting Period 05/07/2021 to 06/12/2021  See note below for Objective Data and Assessment of Progress/Goals.      Patient Details  Name: Brandy Roberts MRN: [id number] Date of Birth: 1970-10-26 Referring Provider (PT): Clearance Coots   Encounter Date: 06/12/2021   PT End of Session - 06/12/21 0848     Visit Number 9    Number of Visits 21    Date for PT Re-Evaluation 07/24/21    Authorization Type UHC Medicare & Medicaid    PT Start Time [phone]    PT Stop Time 0930    PT Time Calculation (min) 44 min    Activity Tolerance Patient tolerated treatment well    Behavior During Therapy [facility] for tasks assessed/performed             Past Medical History:  Diagnosis Date   Anemia    Anxiety    Aortic regurgitation    Cardiomyopathy due to hypertension ([place]) 10/23/2014   Chest discomfort 10/12/2018   Chronic ischemic vertebrobasilar artery thalamic stroke    Depression    Essential hypertension 05/02/2015   Former smoker 01/24/2015   H/O noncompliance with medical treatment, presenting hazards to health    Headache 05/01/2015   History of ischemic middle cerebral artery stroke embolic    Hyperlipidemia    Hypertension    Hypertensive heart disease    Migraine 05/21/2018   Migraines    Nonrheumatic aortic (valve) insufficiency    Osteoarthritis    Overweight (BMI 25.0-29.9)    Palpitations 10/12/2018   Sleep apnea    Snoring 05/02/2015   Stroke ([place]) 10/25/2016   Syncope and collapse 10/07/2018    Past Surgical History:  Procedure Laterality Date   CESAREAN SECTION     GANGLION CYST EXCISION     TEE WITHOUT CARDIOVERSION N/A 10/09/2014   Procedure: TRANSESOPHAGEAL ECHOCARDIOGRAM (TEE);  Surgeon: Sanda Klein, MD;  Location: [facility] ENDOSCOPY;  Service: Cardiovascular;   Laterality: N/A;   TONSILLECTOMY      There were no vitals filed for this visit.   Subjective Assessment - 06/12/21 0848     Subjective Patient reports as long as she comes here and does what she is supposed to do, she doesn't have any problem.  Yesterday she didn't do her exercises and her back is hurting.  Has stopped smoking now for 3 days.    Pertinent History stroke, OA, migraines, HTN, HLD, depression, anxiety, anemia, ganglion cyst excision    Diagnostic tests 10/11/18 brain MRI: No acute intracranial abnormality.Unchanged small chronic infarcts.    Patient Stated Goals get better, not have pain    Currently in Pain? Yes    Pain Score 7     Pain Location Back    Pain Orientation Lower;Mid                [facility] PT Assessment - 06/12/21 0001       Assessment   Medical Diagnosis M54.16 (ICD-10-CM) - Lumbar radiculopathy    Referring Provider (PT) Clearance Coots    Onset Date/Surgical Date 06/26/18    Hand Dominance Right    Next MD Visit 06/17/2021      Observation/Other Assessments   Focus on Therapeutic Outcomes (FOTO)  77  [place] Adult PT Treatment/Exercise - 06/12/21 0001       Lumbar Exercises: Aerobic   Nustep L5x 6 min      Lumbar Exercises: Seated   Other Seated Lumbar Exercises pelvic tilts seated x 10, gentle upper trunk twists x 10    Other Seated Lumbar Exercises Leg lifts (R only) 2 x 5      Modalities   Modalities Electrical Stimulation;Moist Heat      Moist Heat Therapy   Number Minutes Moist Heat 14 Minutes    Moist Heat Location Lumbar Spine      Electrical Stimulation   Electrical Stimulation Location bilat lumbar paraspinals    Electrical Stimulation Action IFC    Electrical Stimulation Parameters 14 min    Electrical Stimulation Goals Pain                       PT Short Term Goals - 05/30/21 1143       PT SHORT TERM GOAL #1   Title Patient to be independent with initial HEP.     Time 2    Period Weeks    Status On-going   pt denies compliance with HEP   Target Date 05/21/21               PT Long Term Goals - 06/12/21 0850       PT LONG TERM GOAL #1   Title Patient to be independent with advanced HEP.    Time 6    Period Weeks    Status On-going   06/12/2020- met for current, reports good compliance.   Target Date 07/24/21      PT LONG TERM GOAL #2   Title Patient will be able to complete 5x STS with UE support in <30 seconds to demonstrate improved functional strength.    Baseline 52 seconds with UE support.    Time 6    Period Weeks    Status Achieved   13.07 sec w/o UE support   Target Date 06/18/21      PT LONG TERM GOAL #3   Title Patient will report 75% improvement in LBP/radicular symptoms.    Time 6    Period Weeks    Status On-going   06/12/2021- 50% improvement   Target Date 07/24/21      PT LONG TERM GOAL #4   Title Patient will tolerated standing/walking for 5 minutes without severe LBP/bil LE to perform ADLs    Baseline cannot walk to mailbox due to pain.    Time 6    Period Weeks    Status On-going   06/12/2021- poor tolerance to gait today due to LBP, completed ~ 150' with 2 rest breaks.   Target Date 07/24/21      PT LONG TERM GOAL #5   Title Patient will demonstrate improved physical functioning by improving FOTO score to 44.    Baseline 24    Time 6    Period Weeks    Status Achieved   06/12/21- 45% on FOTO   Target Date 06/18/21                   Plan - 06/12/21 0924     Clinical Impression Statement Patient arrived with complaint of severe midline LBP today, and decreased tolerance to all exercises.  Needs constant encouragement to participate and frequent rest breaks.  Despite this, she is demonstrating overall improvement in pain and functional strength, reporting 50% overall improvement  and FOTO has improved to 45%, above expected outcome.  She would benefit from continued skilled therapy and extension  of POC for additional 6 weeks 2x/week.  Extended time needed due to comorbidities and deconditioning.    Personal Factors and Comorbidities Comorbidity 3+;Transportation;Time since onset of injury/illness/exacerbation;Fitness;Past/Current Experience    Comorbidities stroke, OA, migraines, HTN, HLD, depression, anxiety, anemia    PT Frequency 2x / week    PT Duration 6 weeks    PT Treatment/Interventions ADLs/Self Care Home Management;Cryotherapy;Electrical Stimulation;Moist Heat;Balance training;Therapeutic exercise;Therapeutic activities;Functional mobility training;Stair training;Gait training;Ultrasound;Neuromuscular re-education;Patient/family education;Manual techniques;Vasopneumatic Device;Taping;Passive range of motion;Spinal Manipulations;Joint Manipulations;Dry needling;Traction    PT Next Visit Plan seated exercises for comfort; WB exercises to help with gait    PT Home Exercise Plan Access Code: [email or web address]    Consulted and Agree with Plan of Care Patient             Patient will benefit from skilled therapeutic intervention in order to improve the following deficits and impairments:  Abnormal gait, Decreased knowledge of precautions, Decreased activity tolerance, Decreased strength, Decreased balance, Decreased mobility, Difficulty walking, Improper body mechanics, Decreased range of motion, Impaired flexibility, Postural dysfunction, Decreased endurance, Increased muscle spasms, Impaired tone, Decreased safety awareness, Increased fascial restricitons, Pain  Visit Diagnosis: Acute midline low back pain with bilateral sciatica  Difficulty in walking, not elsewhere classified  Muscle weakness (generalized)     Problem List Patient Active Problem List   Diagnosis Date Noted   Vitamin D deficiency 05/15/2021   Facet arthropathy 05/15/2021   SI joint arthritis 05/15/2021   Lumbar radiculopathy 05/01/2021   Obesity (BMI 35.0-39.9 without comorbidity) 11/26/2020   Mixed  dyslipidemia 11/26/2020   Anemia    Anxiety    Depression    Hypertension    Migraines    Nonrheumatic aortic (valve) insufficiency    Osteoarthritis    Sleep apnea    Palpitations 10/12/2018   Chest discomfort 10/12/2018   Syncope and collapse 10/07/2018   Migraine 05/21/2018   Stroke ([place]) 10/25/2016   Snoring 05/02/2015   Essential hypertension 05/02/2015   Headache 05/01/2015   Former smoker 01/24/2015   Cardiomyopathy due to hypertension ([place]) 10/23/2014   Chronic ischemic vertebrobasilar artery thalamic stroke    Hypertensive heart disease    Overweight (BMI 25.0-29.9)    H/O noncompliance with medical treatment, presenting hazards to health    Aortic regurgitation    History of ischemic middle cerebral artery stroke embolic    Hyperlipidemia     Rennie Natter, PT, DPT  06/12/2021, 12:01 PM  [facility] [address]  [place], [place], [address] Phone: [phone]   Fax:  [phone]  Name: Ashanti Ratti MRN: [id number] Date of Birth: 11-Jul-1970

## 2021-06-13 NOTE — Telephone Encounter (Signed)
Copied from B and E [id number]. Topic: General - Inquiry >> Jun 13, 2021  9:58 AM Loma [place] wrote: CRM  REF [phone] Re Diaper order Pt is out. Pls FU with pt asap per request [phone]

## 2021-06-17 ENCOUNTER — Ambulatory Visit: Payer: Medicare Other | Admitting: Family Medicine

## 2021-06-17 NOTE — Telephone Encounter (Signed)
Diamond from [facility] called back to report that they have yet to receive correspondence, please advise  Best contact: [phone]

## 2021-06-18 ENCOUNTER — Other Ambulatory Visit: Payer: Self-pay

## 2021-06-18 ENCOUNTER — Encounter: Payer: Self-pay | Admitting: Physical Therapy

## 2021-06-18 ENCOUNTER — Ambulatory Visit: Payer: Medicare Other | Admitting: Physical Therapy

## 2021-06-18 DIAGNOSIS — R2681 Unsteadiness on feet: Secondary | ICD-10-CM

## 2021-06-18 DIAGNOSIS — R2689 Other abnormalities of gait and mobility: Secondary | ICD-10-CM

## 2021-06-18 DIAGNOSIS — M6281 Muscle weakness (generalized): Secondary | ICD-10-CM | POA: Diagnosis not present

## 2021-06-18 DIAGNOSIS — M5441 Lumbago with sciatica, right side: Secondary | ICD-10-CM

## 2021-06-18 DIAGNOSIS — R262 Difficulty in walking, not elsewhere classified: Secondary | ICD-10-CM

## 2021-06-18 DIAGNOSIS — M5442 Lumbago with sciatica, left side: Secondary | ICD-10-CM | POA: Diagnosis not present

## 2021-06-18 NOTE — Therapy (Signed)
[facility] [address]  [place], [place], [address] Phone: [phone]   Fax:  [phone]  Physical Therapy Treatment  Patient Details  Name: Brandy Roberts MRN: [id number] Date of Birth: 04/10/1971 Referring Provider (PT): Clearance Coots   Encounter Date: 06/18/2021   PT End of Session - 06/18/21 0755     Visit Number 10    Number of Visits 21    Date for PT Re-Evaluation 07/24/21    Authorization Type UHC Medicare & Medicaid    Progress Note Due on Visit 32    PT Start Time 0757    PT Stop Time 0839    PT Time Calculation (min) 42 min    Activity Tolerance Patient tolerated treatment well    Behavior During Therapy [facility] for tasks assessed/performed             Past Medical History:  Diagnosis Date   Anemia    Anxiety    Aortic regurgitation    Cardiomyopathy due to hypertension ([place]) 10/23/2014   Chest discomfort 10/12/2018   Chronic ischemic vertebrobasilar artery thalamic stroke    Depression    Essential hypertension 05/02/2015   Former smoker 01/24/2015   H/O noncompliance with medical treatment, presenting hazards to health    Headache 05/01/2015   History of ischemic middle cerebral artery stroke embolic    Hyperlipidemia    Hypertension    Hypertensive heart disease    Migraine 05/21/2018   Migraines    Nonrheumatic aortic (valve) insufficiency    Osteoarthritis    Overweight (BMI 25.0-29.9)    Palpitations 10/12/2018   Sleep apnea    Snoring 05/02/2015   Stroke ([place]) 10/25/2016   Syncope and collapse 10/07/2018    Past Surgical History:  Procedure Laterality Date   CESAREAN SECTION     GANGLION CYST EXCISION     TEE WITHOUT CARDIOVERSION N/A 10/09/2014   Procedure: TRANSESOPHAGEAL ECHOCARDIOGRAM (TEE);  Surgeon: Sanda Klein, MD;  Location: [facility] ENDOSCOPY;  Service: Cardiovascular;  Laterality: N/A;   TONSILLECTOMY      There were no vitals filed for this visit.    Subjective Assessment - 06/18/21 0759     Subjective Patient reports she has been sleeping on the couch for the past 4 months due to her son and his family sleeping in her bedroom. She feels this doesn't help her back.    Pertinent History stroke, OA, migraines, HTN, HLD, depression, anxiety, anemia, ganglion cyst excision    Patient Stated Goals get better, not have pain    Currently in Pain? Yes    Pain Score 7     Pain Location Back    Pain Orientation Lower    Pain Descriptors / Indicators Stabbing    Pain Type Acute pain                               [facility] Adult PT Treatment/Exercise - 06/18/21 0001       Lumbar Exercises: Aerobic   Nustep L5x 6 min      Lumbar Exercises: Standing   Heel Raises 10 reps    Heel Raises Limitations had to sit after 10      Lumbar Exercises: Seated   Sit to Stand 10 reps    Sit to Stand Limitations 5# 2x5    Other Seated Lumbar Exercises rows with blue band x 10; bicep curl to [facility] press 1#  2 x 5 (tried standing but too difficult)    Other Seated Lumbar Exercises Leg lifts R 1 x 10; L 1x 5      Lumbar Exercises: Supine   Clam 15 reps    Clam Limitations red band    Bent Knee Raise 10 reps    Bent Knee Raise Limitations red band around thighs    Bridge 10 reps    Straight Leg Raise 10 reps    Straight Leg Raises Limitations Rt leg only    Other Supine Lumbar Exercises bil shoulder horizontal ADDuction 1# R, 0# left x 5                       PT Short Term Goals - 05/30/21 1143       PT SHORT TERM GOAL #1   Title Patient to be independent with initial HEP.    Time 2    Period Weeks    Status On-going   pt denies compliance with HEP   Target Date 05/21/21               PT Long Term Goals - 06/12/21 0850       PT LONG TERM GOAL #1   Title Patient to be independent with advanced HEP.    Time 6    Period Weeks    Status On-going   06/12/2020- met for current, reports good compliance.   Target  Date 07/24/21      PT LONG TERM GOAL #2   Title Patient will be able to complete 5x STS with UE support in <30 seconds to demonstrate improved functional strength.    Baseline 52 seconds with UE support.    Time 6    Period Weeks    Status Achieved   13.07 sec w/o UE support   Target Date 06/18/21      PT LONG TERM GOAL #3   Title Patient will report 75% improvement in LBP/radicular symptoms.    Time 6    Period Weeks    Status On-going   06/12/2021- 50% improvement   Target Date 07/24/21      PT LONG TERM GOAL #4   Title Patient will tolerated standing/walking for 5 minutes without severe LBP/bil LE to perform ADLs    Baseline cannot walk to mailbox due to pain.    Time 6    Period Weeks    Status On-going   06/12/2021- poor tolerance to gait today due to LBP, completed ~ 150' with 2 rest breaks.   Target Date 07/24/21      PT LONG TERM GOAL #5   Title Patient will demonstrate improved physical functioning by improving FOTO score to 44.    Baseline 24    Time 6    Period Weeks    Status Achieved   06/12/21- 45% on FOTO   Target Date 06/18/21                   Plan - 06/18/21 0837     Clinical Impression Statement Patient did well today. She worked hard to complete a lot of new exercises today without complaint of back pain. Still requires intermittent short rests between sets.    Comorbidities stroke, OA, migraines, HTN, HLD, depression, anxiety, anemia    Examination-Activity Limitations Bathing;Bend;Stairs;Carry;Stand;Toileting;Dressing;Transfers;Hygiene/Grooming;Lift;Locomotion Level;Reach Overhead;Sleep;Bed Mobility    PT Frequency 2x / week    PT Duration 6 weeks    PT Treatment/Interventions ADLs/Self Care Home Management;Cryotherapy;Dealer  Stimulation;Moist Heat;Balance training;Therapeutic exercise;Therapeutic activities;Functional mobility training;Stair training;Gait training;Ultrasound;Neuromuscular re-education;Patient/family education;Manual  techniques;Vasopneumatic Device;Taping;Passive range of motion;Spinal Manipulations;Joint Manipulations;Dry needling;Traction    PT Next Visit Plan seated exercises for comfort; WB exercises to help with gait    PT Home Exercise Plan Access Code: [email or web address]    Consulted and Agree with Plan of Care Patient             Patient will benefit from skilled therapeutic intervention in order to improve the following deficits and impairments:  Abnormal gait, Decreased knowledge of precautions, Decreased activity tolerance, Decreased strength, Decreased balance, Decreased mobility, Difficulty walking, Improper body mechanics, Decreased range of motion, Impaired flexibility, Postural dysfunction, Decreased endurance, Increased muscle spasms, Impaired tone, Decreased safety awareness, Increased fascial restricitons, Pain  Visit Diagnosis: Acute midline low back pain with bilateral sciatica  Difficulty in walking, not elsewhere classified  Muscle weakness (generalized)  Unsteadiness on feet  Other abnormalities of gait and mobility     Problem List Patient Active Problem List   Diagnosis Date Noted   Vitamin D deficiency 05/15/2021   Facet arthropathy 05/15/2021   SI joint arthritis 05/15/2021   Lumbar radiculopathy 05/01/2021   Obesity (BMI 35.0-39.9 without comorbidity) 11/26/2020   Mixed dyslipidemia 11/26/2020   Anemia    Anxiety    Depression    Hypertension    Migraines    Nonrheumatic aortic (valve) insufficiency    Osteoarthritis    Sleep apnea    Palpitations 10/12/2018   Chest discomfort 10/12/2018   Syncope and collapse 10/07/2018   Migraine 05/21/2018   Stroke ([place]) 10/25/2016   Snoring 05/02/2015   Essential hypertension 05/02/2015   Headache 05/01/2015   Former smoker 01/24/2015   Cardiomyopathy due to hypertension ([place]) 10/23/2014   Chronic ischemic vertebrobasilar artery thalamic stroke    Hypertensive heart disease    Overweight (BMI 25.0-29.9)    H/O  noncompliance with medical treatment, presenting hazards to health    Aortic regurgitation    History of ischemic middle cerebral artery stroke embolic    Hyperlipidemia     Madelyn Flavors PT 06/18/2021, 12:24 PM  [facility] [address]  [place], [place], [address] Phone: [phone]   Fax:  [phone]  Name: Roman Sandall MRN: [id number] Date of Birth: August 30, 1970

## 2021-06-19 ENCOUNTER — Ambulatory Visit: Payer: Commercial Managed Care - HMO | Attending: Nurse Practitioner | Admitting: Nurse Practitioner

## 2021-06-19 ENCOUNTER — Encounter: Payer: Self-pay | Admitting: Nurse Practitioner

## 2021-06-19 VITALS — BP 117/73 | HR 79 | Resp 20 | Wt 209.0 lb

## 2021-06-19 DIAGNOSIS — I10 Essential (primary) hypertension: Secondary | ICD-10-CM

## 2021-06-19 DIAGNOSIS — Z1211 Encounter for screening for malignant neoplasm of colon: Secondary | ICD-10-CM | POA: Diagnosis not present

## 2021-06-19 DIAGNOSIS — N393 Stress incontinence (female) (male): Secondary | ICD-10-CM

## 2021-06-19 DIAGNOSIS — R7303 Prediabetes: Secondary | ICD-10-CM | POA: Diagnosis not present

## 2021-06-19 LAB — POCT GLYCOSYLATED HEMOGLOBIN (HGB A1C): HbA1c, POC (controlled diabetic range): 6.8 % (ref 0.0–7.0)

## 2021-06-19 MED ORDER — MISC. DEVICES MISC: 0 refills | Status: DC

## 2021-06-19 NOTE — Progress Notes (Signed)
BLE pain 7/10  Incontinent supply

## 2021-06-19 NOTE — Progress Notes (Signed)
Assessment & Plan:  Brandy Roberts was seen today for diabetes and leg pain.  Diagnoses and all orders for this visit:  Primary hypertension -     CMP14+EGFR Continue all antihypertensives as prescribed.  Remember to bring in your blood pressure log with you for your follow up appointment.  DASH/Mediterranean Diets are healthier choices for HTN.    Prediabetes -     HgB A1c Continue blood sugar control as discussed in office today, low carbohydrate diet, and regular physical exercise as tolerated, 150 minutes per week (30 min each day, 5 days per week, or 50 min 3 days per week).   Colon cancer screening -     Fecal occult blood, imunochemical([facility])  Stress incontinence -     Misc. Devices MISC; Incontinent supplies    Patient has been counseled on age-appropriate routine health concerns for screening and prevention. These are reviewed and up-to-date. Referrals have been placed accordingly. Immunizations are up-to-date or declined.    Subjective:   Chief Complaint  Patient presents with   Diabetes   Leg Pain   HPI Brandy Roberts 51 y.o. female presents to office today for follow up. She has a past medical history of Anemia, Anxiety, Aortic regurgitation, Cardiomyopathy due to hypertension (10/23/2014), Chronic ischemic vertebrobasilar artery thalamic stroke, Depression, Essential hypertension (05/02/2015), Former smoker (01/24/2015), H/O noncompliance with medical treatment, presenting hazards to health, Headache (05/01/2015), History of ischemic middle cerebral artery stroke embolic, Hyperlipidemia, Hypertension, Hypertensive heart disease, Migraine (05/21/2018), Migraines, Nonrheumatic aortic (valve) insufficiency, Osteoarthritis, Overweight (BMI 25.0-29.9), Palpitations (10/12/2018), Sleep apnea, Snoring (05/02/2015), Stroke ([place]) (10/25/2016), and Syncope and collapse (10/07/2018).    Incontinence Onset 2-3 years ago. She uses adult diapers 24/7.  She leaks urine with  bending, coughing, lifting, standing, walking, laughing, sneezing, with urge, with a full bladder, during the night. Patient describes the symptoms as  a blunted sense of urge to urinate, urge to urinate with little or no warning, urine leakage with coughing/heavy physical activity, and urine leaking unpredictably.  Factors associated with symptoms include associated with menopause, decreased sensation in perineum, medications. Evaluation to date includes UA/CS: normal.Treatment to date includes none.    HTN Blood pressure is well controlled. She is taking amlodipine 10 mg daily, hydralazine 50 mg TID, lisinopril 2m daily, lopressor 25 mg BID, and spirinolactone 25 mg daily as prescribed.  BP Readings from Last 3 Encounters:  06/19/21 117/73  06/04/21 128/82  06/04/21 138/78    Prediabetes Now in diabetes range. She refuses to start metformin today. States she will work on her diet. Lab Results  Component Value Date   HGBA1C 6.8 06/19/2021     Low back pain with sciatica and peripheral neuropathy States gabapentin was ineffective so she stopped taking it. She was prescribed lyrica several months ago however she stopped taking that as well as she states it caused her neuropathy symptoms to worsen.  Pain is intermittent.  Lasts 346m to an hour. Pain is described as stabbing and burning R>L. She denies any injury or trauma. She does have a history of stroke. She is currently working with Sports Medicine Dr ScRaeford Razornd PT.     Review of Systems  Constitutional:  Negative for fever, malaise/fatigue and weight loss.  HENT: Negative.  Negative for nosebleeds.   Eyes: Negative.  Negative for blurred vision, double vision and photophobia.  Respiratory: Negative.  Negative for cough and shortness of breath.   Cardiovascular: Negative.  Negative for chest pain, palpitations and leg swelling.  Gastrointestinal:  Negative.  Negative for heartburn, nausea and vomiting.  Musculoskeletal:  Positive for  back pain. Negative for myalgias.  Neurological:  Positive for sensory change and weakness (uses clawed cane). Negative for dizziness, focal weakness, seizures and headaches.  Psychiatric/Behavioral: Negative.  Negative for suicidal ideas.    Past Medical History:  Diagnosis Date   Anemia    Anxiety    Aortic regurgitation    Cardiomyopathy due to hypertension ([place]) 10/23/2014   Chest discomfort 10/12/2018   Chronic ischemic vertebrobasilar artery thalamic stroke    Depression    Essential hypertension 05/02/2015   Former smoker 01/24/2015   H/O noncompliance with medical treatment, presenting hazards to health    Headache 05/01/2015   History of ischemic middle cerebral artery stroke embolic    Hyperlipidemia    Hypertension    Hypertensive heart disease    Migraine 05/21/2018   Migraines    Nonrheumatic aortic (valve) insufficiency    Osteoarthritis    Overweight (BMI 25.0-29.9)    Palpitations 10/12/2018   Sleep apnea    Snoring 05/02/2015   Stroke ([place]) 10/25/2016   Syncope and collapse 10/07/2018    Past Surgical History:  Procedure Laterality Date   CESAREAN SECTION     GANGLION CYST EXCISION     TEE WITHOUT CARDIOVERSION N/A 10/09/2014   Procedure: TRANSESOPHAGEAL ECHOCARDIOGRAM (TEE);  Surgeon: Sanda Klein, MD;  Location: [facility] ENDOSCOPY;  Service: Cardiovascular;  Laterality: N/A;   TONSILLECTOMY      Family History  Problem Relation Age of Onset   Cancer Mother        type unknown   Hypertension Mother    Hypertension Sister    Diabetes Sister    Cancer Maternal Aunt        4 aunts died of cancer types unknown    Social History Reviewed with no changes to be made today.   Outpatient Medications Prior to Visit  Medication Sig Dispense Refill   amLODipine (NORVASC) 10 MG tablet TAKE 1 TABLET BY MOUTH EVERY DAY FOR BLOOD PRESSURE 90 tablet 0   atorvastatin (LIPITOR) 80 MG tablet TAKE 1 TABLET BY MOUTH EVERY DAY AT 6PM FOR CHOLESTEROL 90 tablet 1   busPIRone  (BUSPAR) 10 MG tablet Take 1 tablet (10 mg total) by mouth 3 (three) times daily. 90 tablet 3   clopidogrel (PLAVIX) 75 MG tablet TAKE 1 TABLET BY MOUTH EVERY DAY 90 tablet 0   FLUoxetine (PROZAC) 10 MG tablet Take 10 mg by mouth daily.     flurbiprofen (ANSAID) 100 MG tablet 1 tablet every 8 hours as needed (maximum 3 tablets in 24 hours). 30 tablet 5   Galcanezumab-gnlm 120 MG/ML SOAJ Inject 120 mg into the skin every 30 (thirty) days. 1.12 mL 0   hydrALAZINE (APRESOLINE) 50 MG tablet TAKE 1 TABLET BY MOUTH 3 TIMES DAILY 270 tablet 0   hydrochlorothiazide (MICROZIDE) 12.5 MG capsule TAKE 1 CAPSULE BY MOUTH DAILY 90 capsule 0   HYDROcodone-acetaminophen (NORCO/VICODIN) 5-325 MG tablet Take 1 tablet by mouth every 8 (eight) hours as needed. 15 tablet 0   hydrOXYzine (ATARAX/VISTARIL) 50 MG tablet Take 50 mg by mouth 3 (three) times daily as needed for anxiety or sleep.     lisinopril (ZESTRIL) 10 MG tablet Take 10 mg by mouth daily.     methocarbamol (ROBAXIN) 500 MG tablet Take 1 tablet (500 mg total) by mouth 2 (two) times daily. 20 tablet 0   metoCLOPramide (REGLAN) 10 MG tablet Take 10 mg by mouth  every 6 (six) hours as needed for nausea or headache.     metoprolol tartrate (LOPRESSOR) 25 MG tablet TAKE 1 TABLET BY MOUTH 2 TIMES A DAY 180 tablet 0   mupirocin ointment (BACTROBAN) 2 % Apply 1 application topically 2 (two) times daily. 100 g 3   nitroGLYCERIN (NITROSTAT) 0.4 MG SL tablet Place 0.4 mg under the tongue every 5 (five) minutes as needed for chest pain.     pantoprazole (PROTONIX) 20 MG tablet TAKE 1 TABLET BY MOUTH EVERY DAY FOR ACID REFLUX 90 tablet 0   predniSONE (STERAPRED UNI-PAK 21 TAB) 10 MG (21) TBPK tablet Take by mouth daily. Take 6 tabs by mouth daily  for 2 days, then 5 tabs for 2 days, then 4 tabs for 2 days, then 3 tabs for 2 days, 2 tabs for 2 days, then 1 tab by mouth daily for 2 days 42 tablet 0   spironolactone (ALDACTONE) 25 MG tablet TAKE 1 TABLET BY MOUTH EVERY DAY  90 tablet 0   traZODone (DESYREL) 100 MG tablet TAKE 1 TABLET BY MOUTH EVERY NIGHT AT BEDTIME 90 tablet 1   venlafaxine XR (EFFEXOR-XR) 75 MG 24 hr capsule Take 3 capsules (225 mg total) by mouth daily with breakfast. 90 capsule 5   Vitamin D, Ergocalciferol, (DRISDOL) 1.25 MG (50000 UNIT) CAPS capsule Take 1 capsule (50,000 Units total) by mouth every 7 (seven) days. Take for 8 total doses(weeks) 8 capsule 0   gabapentin (NEURONTIN) 600 MG tablet Take 1 tablet (600 mg total) by mouth 3 (three) times daily. 270 tablet 2   No facility-administered medications prior to visit.    Allergies  Allergen Reactions   Ibuprofen Other (See Comments)    Can not take with current medications .   Tylenol [Acetaminophen] Other (See Comments)    Pt stated tylenol gives her extreme headache       Objective:    BP 117/73 (BP Location: Right Arm, Patient Position: Sitting, Cuff Size: Large)    Pulse 79    Resp 20    Wt 209 lb (94.8 kg)    SpO2 98%    BMI 33.73 kg/m  Wt Readings from Last 3 Encounters:  06/19/21 209 lb (94.8 kg)  06/04/21 215 lb (97.5 kg)  05/15/21 214 lb (97.1 kg)    Physical Exam       Patient has been counseled extensively about nutrition and exercise as well as the importance of adherence with medications and regular follow-up. The patient was given clear instructions to go to ER or return to medical center if symptoms don't improve, worsen or new problems develop. The patient verbalized understanding.   Follow-up: Return in about 3 months (around 09/17/2021).   [name], FNP-BC [facility] [place], [place]   06/19/2021, 9:43 AM

## 2021-06-20 ENCOUNTER — Encounter: Payer: Self-pay | Admitting: Family Medicine

## 2021-06-20 ENCOUNTER — Ambulatory Visit (INDEPENDENT_AMBULATORY_CARE_PROVIDER_SITE_OTHER): Payer: Medicare Other | Admitting: Family Medicine

## 2021-06-20 ENCOUNTER — Other Ambulatory Visit: Payer: Self-pay | Admitting: Nurse Practitioner

## 2021-06-20 VITALS — Ht 66.0 in | Wt 209.0 lb

## 2021-06-20 DIAGNOSIS — M47819 Spondylosis without myelopathy or radiculopathy, site unspecified: Secondary | ICD-10-CM | POA: Diagnosis not present

## 2021-06-20 DIAGNOSIS — M5416 Radiculopathy, lumbar region: Secondary | ICD-10-CM

## 2021-06-20 DIAGNOSIS — K2100 Gastro-esophageal reflux disease with esophagitis, without bleeding: Secondary | ICD-10-CM

## 2021-06-20 LAB — CMP14+EGFR
ALT: 31 IU/L (ref 0–32)
AST: 25 IU/L (ref 0–40)
Albumin/Globulin Ratio: 1.3 (ref 1.2–2.2)
Albumin: 4.1 g/dL (ref 3.8–4.9)
Alkaline Phosphatase: 128 IU/L — ABNORMAL HIGH (ref 44–121)
BUN/Creatinine Ratio: 19 (ref 9–23)
BUN: 20 mg/dL (ref 6–24)
Bilirubin Total: 0.2 mg/dL (ref 0.0–1.2)
CO2: 24 mmol/L (ref 20–29)
Calcium: 9.7 mg/dL (ref 8.7–10.2)
Chloride: 102 mmol/L (ref 96–106)
Creatinine, Ser: 1.04 mg/dL — ABNORMAL HIGH (ref 0.57–1.00)
Globulin, Total: 3.2 g/dL (ref 1.5–4.5)
Glucose: 120 mg/dL — ABNORMAL HIGH (ref 70–99)
Potassium: 4.3 mmol/L (ref 3.5–5.2)
Sodium: 141 mmol/L (ref 134–144)
Total Protein: 7.3 g/dL (ref 6.0–8.5)
eGFR: 65 mL/min/{1.73_m2} (ref >59)

## 2021-06-20 MED ORDER — HYDROCODONE-ACETAMINOPHEN 5-325 MG PO TABS: 1.0000 | ORAL_TABLET | Freq: Three times a day (TID) | ORAL | 0 refills | Status: DC | PRN

## 2021-06-20 NOTE — Patient Instructions (Signed)
Good to see you Please take the pain medicine as needed  Please schedule the MRI downstairs.   Please send me a message in MyChart with any questions or updates. We'll schedule a virtual visit once the MRI is resulted.   --Dr. Raeford Razor

## 2021-06-20 NOTE — Telephone Encounter (Signed)
Requested Prescriptions  Pending Prescriptions Disp Refills   pantoprazole (PROTONIX) 20 MG tablet [Pharmacy Med Name: PANTOPRAZOLE SODIUM 20 MG DR TAB] 90 tablet 1    Sig: TAKE 1 TABLET BY MOUTH EVERY DAY FOR ACID REFLUX     Gastroenterology: Proton Pump Inhibitors Passed - 06/20/2021 12:01 PM      Passed - Valid encounter within last 12 months    Recent Outpatient Visits          Yesterday Primary hypertension   [place], [name], NP   3 months ago Encounter for annual physical exam   [place], [name], NP   4 months ago Neuropathy   [place], [name], NP   6 months ago Primary hypertension   [place], [name], NP   1 year ago Essential hypertension   [place], [name], NP      Future Appointments            In 3 months [name], NP [place]

## 2021-06-20 NOTE — Assessment & Plan Note (Signed)
Acutely occurring in the lower back.  Pain is more proximal in the lower back and not overlying the SI joints. -Counseled on home exercise therapy and supportive care. -MRI of the lumbar spine to evaluate for facet arthrosis and to consider facet injections.

## 2021-06-20 NOTE — Assessment & Plan Note (Signed)
Acutely worsening.  Having more pain on the right side today. -Counseled on home exercise therapy and supportive care. -Norco. -MRI of the lumbar spine to evaluate for impingement and to pursue epidural.

## 2021-06-20 NOTE — Progress Notes (Signed)
°  Brandy Roberts - 51 y.o. female MRN [id number]  Date of birth: Apr 08, 1971  SUBJECTIVE:  Including CC & ROS.  No chief complaint on file.   Brandy Roberts is a 51 y.o. female that is presenting with acute worsening of her low back and radicular pain.  She has been ongoing pain since November.  Has been through physical therapy and over 6 weeks of home exercise therapy that was physician directed.   Review of Systems See HPI   HISTORY: Past Medical, Surgical, Social, and Family History Reviewed & Updated per EMR.   Pertinent Historical Findings include:  Past Medical History:  Diagnosis Date   Anemia    Anxiety    Aortic regurgitation    Cardiomyopathy due to hypertension ([place]) 10/23/2014   Chest discomfort 10/12/2018   Chronic ischemic vertebrobasilar artery thalamic stroke    Depression    Essential hypertension 05/02/2015   Former smoker 01/24/2015   H/O noncompliance with medical treatment, presenting hazards to health    Headache 05/01/2015   History of ischemic middle cerebral artery stroke embolic    Hyperlipidemia    Hypertension    Hypertensive heart disease    Migraine 05/21/2018   Migraines    Nonrheumatic aortic (valve) insufficiency    Osteoarthritis    Overweight (BMI 25.0-29.9)    Palpitations 10/12/2018   Sleep apnea    Snoring 05/02/2015   Stroke ([place]) 10/25/2016   Syncope and collapse 10/07/2018    Past Surgical History:  Procedure Laterality Date   CESAREAN SECTION     GANGLION CYST EXCISION     TEE WITHOUT CARDIOVERSION N/A 10/09/2014   Procedure: TRANSESOPHAGEAL ECHOCARDIOGRAM (TEE);  Surgeon: Sanda Klein, MD;  Location[facility] ENDOSCOPY;  Service: Cardiovascular;  Laterality: N/A;   TONSILLECTOMY       PHYSICAL EXAM:  VS: Ht 5\' 6"  (1.676 m)    Wt 209 lb (94.8 kg)    BMI 33.73 kg/m  Physical Exam Gen: NAD, alert, cooperative with exam, well-appearing MSK:  Neurovascularly intact       ASSESSMENT & PLAN:   Lumbar radiculopathy Acutely  worsening.  Having more pain on the right side today. -Counseled on home exercise therapy and supportive care. -Norco. -MRI of the lumbar spine to evaluate for impingement and to pursue epidural.  Facet arthropathy Acutely occurring in the lower back.  Pain is more proximal in the lower back and not overlying the SI joints. -Counseled on home exercise therapy and supportive care. -MRI of the lumbar spine to evaluate for facet arthrosis and to consider facet injections.

## 2021-06-21 NOTE — Telephone Encounter (Signed)
Fax was sent on Jan 18 with notes.

## 2021-06-24 NOTE — Telephone Encounter (Signed)
Patient states Home [facility] delivered has not received orders and would like nurse to re fax and call her when its completed. Patients states she has been out of diapers. Patient would like this request expedited

## 2021-06-24 NOTE — Telephone Encounter (Signed)
Fax was sent on Jan 18 with notes.

## 2021-06-25 ENCOUNTER — Telehealth: Payer: Self-pay

## 2021-06-25 NOTE — Telephone Encounter (Signed)
Called patient reviewed all information and repeated back to me. Will call if any questions.  ? ?

## 2021-06-25 NOTE — Telephone Encounter (Signed)
Forms was faxed on 06/25/2021

## 2021-06-25 NOTE — Telephone Encounter (Signed)
-----   Message from [name], NP sent at 06/25/2021 11:22 AM EST ----- Kidney function is essentially normal.  Liver enzymes have decreased still slightly elevated make sure diet is low-fat low-cholesterol.

## 2021-06-26 ENCOUNTER — Other Ambulatory Visit: Payer: Self-pay

## 2021-06-26 ENCOUNTER — Ambulatory Visit: Payer: Medicare Other

## 2021-06-26 DIAGNOSIS — M5442 Lumbago with sciatica, left side: Secondary | ICD-10-CM

## 2021-06-26 DIAGNOSIS — M6281 Muscle weakness (generalized): Secondary | ICD-10-CM

## 2021-06-26 DIAGNOSIS — M5441 Lumbago with sciatica, right side: Secondary | ICD-10-CM | POA: Diagnosis not present

## 2021-06-26 DIAGNOSIS — R2689 Other abnormalities of gait and mobility: Secondary | ICD-10-CM

## 2021-06-26 DIAGNOSIS — R2681 Unsteadiness on feet: Secondary | ICD-10-CM

## 2021-06-26 DIAGNOSIS — R262 Difficulty in walking, not elsewhere classified: Secondary | ICD-10-CM

## 2021-06-26 NOTE — Therapy (Addendum)
PHYSICAL THERAPY DISCHARGE SUMMARY  Visits from Start of Care: 11  Current functional level related to goals / functional outcomes: See below   Remaining deficits: LBP, see below   Education / Equipment: HEP  Plan: Patient agrees to discharge.  Patient goals were not met. Patient is being discharged due needing other tests and per patient MD recommended "break from PT."   Rennie Natter, PT, DPT    [facility] [address]  [place], [place], [address] Phone: [phone]   Fax:  [phone]  Physical Therapy Treatment  Patient Details  Name: Brandy Roberts MRN: [id number] Date of Birth: [birth year] Referring Provider (PT): Clearance Coots   Encounter Date: 06/26/2021   PT End of Session - 06/26/21 0932     Visit Number 11    Number of Visits 21    Date for PT Re-Evaluation 07/24/21    Authorization Type UHC Medicare & Medicaid    Progress Note Due on Visit 19    PT Start Time 0845    PT Stop Time 0927    PT Time Calculation (min) 42 min    Activity Tolerance Patient tolerated treatment well;Patient limited by pain;Patient limited by fatigue    Behavior During Therapy [facility] for tasks assessed/performed             Past Medical History:  Diagnosis Date   Anemia    Anxiety    Aortic regurgitation    Cardiomyopathy due to hypertension ([place]) 10/23/2014   Chest discomfort 10/12/2018   Chronic ischemic vertebrobasilar artery thalamic stroke    Depression    Essential hypertension 05/02/2015   Former smoker 01/24/2015   H/O noncompliance with medical treatment, presenting hazards to health    Headache 05/01/2015   History of ischemic middle cerebral artery stroke embolic    Hyperlipidemia    Hypertension    Hypertensive heart disease    Migraine 05/21/2018   Migraines    Nonrheumatic aortic (valve) insufficiency    Osteoarthritis    Overweight (BMI 25.0-29.9)    Palpitations 10/12/2018    Sleep apnea    Snoring 05/02/2015   Stroke ([place]) 10/25/2016   Syncope and collapse 10/07/2018    Past Surgical History:  Procedure Laterality Date   CESAREAN SECTION     GANGLION CYST EXCISION     TEE WITHOUT CARDIOVERSION N/A 10/09/2014   Procedure: TRANSESOPHAGEAL ECHOCARDIOGRAM (TEE);  Surgeon: Sanda Klein, MD;  Location: [facility] ENDOSCOPY;  Service: Cardiovascular;  Laterality: N/A;   TONSILLECTOMY      There were no vitals filed for this visit.   Subjective Assessment - 06/26/21 0849     Subjective Pt reports that she is going to get a CT scan and a back injection from the doctor.    Pertinent History stroke, OA, migraines, HTN, HLD, depression, anxiety, anemia, ganglion cyst excision    Diagnostic tests 10/11/18 brain MRI: No acute intracranial abnormality.Unchanged small chronic infarcts.    Patient Stated Goals get better, not have pain    Currently in Pain? No/denies                [facility] PT Assessment - 06/26/21 0001       Assessment   Medical Diagnosis M54.16 (ICD-10-CM) - Lumbar radiculopathy    Referring Provider (PT) Clearance Coots    Onset Date/Surgical Date 06/26/18  [place] Adult PT Treatment/Exercise - 06/26/21 0001       Lumbar Exercises: Aerobic   UBE (Upper Arm Bike) L1.0 6 min      Lumbar Exercises: Machines for Strengthening   Cybex Knee Extension 5# 10x with therapist help      Lumbar Exercises: Standing   Other Standing Lumbar Exercises shoulder shrugs with 2lb weights 10x      Lumbar Exercises: Seated   Sit to Stand 15 reps    Other Seated Lumbar Exercises punches with 2lb weights 10x each UE      Knee/Hip Exercises: Seated   Ball Squeeze 15x3"    Other Seated Knee/Hip Exercises HS curls with YTB 10x                       PT Short Term Goals - 05/30/21 1143       PT SHORT TERM GOAL #1   Title Patient to be independent with initial HEP.    Time 2    Period Weeks    Status  On-going   pt denies compliance with HEP   Target Date 05/21/21               PT Long Term Goals - 06/26/21 9562       PT LONG TERM GOAL #1   Title Patient to be independent with advanced HEP.    Time 6    Period Weeks    Status On-going   06/12/2020- met for current, reports good compliance.   Target Date 07/24/21      PT LONG TERM GOAL #2   Title Patient will be able to complete 5x STS with UE support in <30 seconds to demonstrate improved functional strength.    Baseline 52 seconds with UE support.    Time 6    Period Weeks    Status Achieved   13.07 sec w/o UE support   Target Date 06/18/21      PT LONG TERM GOAL #3   Title Patient will report 75% improvement in LBP/radicular symptoms.    Time 6    Period Weeks    Status On-going   06/26/21-70% improvement   Target Date 07/24/21      PT LONG TERM GOAL #4   Title Patient will tolerated standing/walking for 5 minutes without severe LBP/bil LE to perform ADLs    Baseline cannot walk to mailbox due to pain.    Time 6    Period Weeks    Status On-going   06/26/21- limited with standing tolerance   Target Date 07/24/21      PT LONG TERM GOAL #5   Title Patient will demonstrate improved physical functioning by improving FOTO score to 44.    Baseline 24    Time 6    Period Weeks    Status Achieved   06/12/21- 45% on FOTO   Target Date 06/18/21                   Plan - 06/26/21 0932     Clinical Impression Statement Pt stated that she planned to end PT for now, as her doctor wants her to take a break to get a CT scan and lumbar injection. She has made improvement with overall LB and leg pain (70% improvement). Pt does not have a good tolerance for standing and walking, with exercises today she required rest after being on her feet for ~15 sec. Pt has made progress with  goals, most functional limitations remain with her LE muscle endurance, which limit her ability with ADLs. Due to her upcoming examinations she  will be DC from PT.    Personal Factors and Comorbidities Comorbidity 3+;Transportation;Time since onset of injury/illness/exacerbation;Fitness;Past/Current Experience    Comorbidities stroke, OA, migraines, HTN, HLD, depression, anxiety, anemia    PT Frequency 2x / week    PT Duration 6 weeks    PT Treatment/Interventions ADLs/Self Care Home Management;Cryotherapy;Electrical Stimulation;Moist Heat;Balance training;Therapeutic exercise;Therapeutic activities;Functional mobility training;Stair training;Gait training;Ultrasound;Neuromuscular re-education;Patient/family education;Manual techniques;Vasopneumatic Device;Taping;Passive range of motion;Spinal Manipulations;Joint Manipulations;Dry needling;Traction    PT Next Visit Plan DC from PT    PT Home Exercise Plan Access Code: [email or web address]    Consulted and Agree with Plan of Care Patient             Patient will benefit from skilled therapeutic intervention in order to improve the following deficits and impairments:  Abnormal gait, Decreased knowledge of precautions, Decreased activity tolerance, Decreased strength, Decreased balance, Decreased mobility, Difficulty walking, Improper body mechanics, Decreased range of motion, Impaired flexibility, Postural dysfunction, Decreased endurance, Increased muscle spasms, Impaired tone, Decreased safety awareness, Increased fascial restricitons, Pain  Visit Diagnosis: Acute midline low back pain with bilateral sciatica  Difficulty in walking, not elsewhere classified  Muscle weakness (generalized)  Unsteadiness on feet  Other abnormalities of gait and mobility     Problem List Patient Active Problem List   Diagnosis Date Noted   Vitamin D deficiency 05/15/2021   Facet arthropathy 05/15/2021   SI joint arthritis 05/15/2021   Lumbar radiculopathy 05/01/2021   Obesity (BMI 35.0-39.9 without comorbidity) 11/26/2020   Mixed dyslipidemia 11/26/2020   Anemia    Anxiety    Depression     Hypertension    Migraines    Nonrheumatic aortic (valve) insufficiency    Osteoarthritis    Sleep apnea    Palpitations 10/12/2018   Chest discomfort 10/12/2018   Syncope and collapse 10/07/2018   Migraine 05/21/2018   Stroke ([place]) 10/25/2016   Snoring 05/02/2015   Essential hypertension 05/02/2015   Headache 05/01/2015   Former smoker 01/24/2015   Cardiomyopathy due to hypertension ([place]) 10/23/2014   Chronic ischemic vertebrobasilar artery thalamic stroke    Hypertensive heart disease    Overweight (BMI 25.0-29.9)    H/O noncompliance with medical treatment, presenting hazards to health    Aortic regurgitation    History of ischemic middle cerebral artery stroke embolic    Hyperlipidemia     Artist Pais, PTA 06/26/2021, 10:36 AM  [facility] [address]  [place], [place], [address] Phone: [phone]   Fax:  [phone]  Name: Brandy Roberts MRN: [id number] Date of Birth: 1970-07-21

## 2021-06-27 ENCOUNTER — Telehealth (HOSPITAL_BASED_OUTPATIENT_CLINIC_OR_DEPARTMENT_OTHER): Payer: Self-pay

## 2021-06-28 ENCOUNTER — Encounter: Payer: Commercial Managed Care - HMO | Admitting: Physical Therapy

## 2021-06-28 ENCOUNTER — Telehealth: Payer: Self-pay | Admitting: Nurse Practitioner

## 2021-06-28 NOTE — Telephone Encounter (Signed)
Pt states he has not gotten her incontinent supplies : diapers, wipes, and bed pads (chucks) . Pt states she needs asap.

## 2021-06-29 ENCOUNTER — Other Ambulatory Visit: Payer: Self-pay

## 2021-06-29 ENCOUNTER — Ambulatory Visit (HOSPITAL_BASED_OUTPATIENT_CLINIC_OR_DEPARTMENT_OTHER)
Admission: RE | Admit: 2021-06-29 | Discharge: 2021-06-29 | Disposition: A | Discharge status: Home / Self Care | Payer: Medicare Other | Source: Ambulatory Visit | Attending: Family Medicine | Admitting: Family Medicine

## 2021-06-29 DIAGNOSIS — M5416 Radiculopathy, lumbar region: Secondary | ICD-10-CM

## 2021-06-29 DIAGNOSIS — M47819 Spondylosis without myelopathy or radiculopathy, site unspecified: Secondary | ICD-10-CM

## 2021-07-02 ENCOUNTER — Encounter: Payer: Commercial Managed Care - HMO | Admitting: Physical Therapy

## 2021-07-02 NOTE — Telephone Encounter (Signed)
Re-faxed today.

## 2021-07-03 ENCOUNTER — Telehealth: Payer: Self-pay | Admitting: Family Medicine

## 2021-07-03 NOTE — Telephone Encounter (Signed)
Patient called states couldn't complete MRI,she had a panic attack & says needed Rx to help w/ nerves.  --Forwarding request to provider for review, if approved send Rx order to :  Preferred Pharmacies     [place], [place] - [address] Phone:  [phone]  Fax:  [phone]    --glh

## 2021-07-04 ENCOUNTER — Other Ambulatory Visit: Payer: Self-pay | Admitting: Neurology

## 2021-07-04 DIAGNOSIS — I639 Cerebral infarction, unspecified: Secondary | ICD-10-CM | POA: Diagnosis not present

## 2021-07-04 MED ORDER — DIAZEPAM 5 MG PO TABS: ORAL_TABLET | ORAL | 0 refills | Status: DC

## 2021-07-04 NOTE — Telephone Encounter (Signed)
Provided valium for MRI.   Rosemarie Ax, MD [facility] 07/04/2021, 9:29 AM

## 2021-07-05 ENCOUNTER — Encounter: Payer: Commercial Managed Care - HMO | Admitting: Physical Therapy

## 2021-07-06 ENCOUNTER — Other Ambulatory Visit (HOSPITAL_BASED_OUTPATIENT_CLINIC_OR_DEPARTMENT_OTHER): Payer: Self-pay | Admitting: Family Medicine

## 2021-07-16 ENCOUNTER — Encounter: Payer: Commercial Managed Care - HMO | Admitting: Physical Therapy

## 2021-07-18 ENCOUNTER — Telehealth: Payer: Self-pay | Admitting: Family Medicine

## 2021-07-18 DIAGNOSIS — M47819 Spondylosis without myelopathy or radiculopathy, site unspecified: Secondary | ICD-10-CM

## 2021-07-18 DIAGNOSIS — M5416 Radiculopathy, lumbar region: Secondary | ICD-10-CM

## 2021-07-18 NOTE — Telephone Encounter (Signed)
MRI order placed. See imaging.

## 2021-07-18 NOTE — Telephone Encounter (Signed)
Pt called to ask that a New MRI order be sent to [place] radiology dept , they cannot use the old one because it show'd completed even though she did not go thru w/ test due to Claustrophobia pt required a sedative( she still has the Valium & will take on appt date 07/27/21   --Forwarding message to med asst.  --glh

## 2021-07-24 ENCOUNTER — Encounter: Payer: Commercial Managed Care - HMO | Admitting: Physical Therapy

## 2021-07-27 ENCOUNTER — Ambulatory Visit (HOSPITAL_BASED_OUTPATIENT_CLINIC_OR_DEPARTMENT_OTHER)
Admission: RE | Admit: 2021-07-27 | Discharge: 2021-07-27 | Disposition: A | Discharge status: Home / Self Care | Payer: Medicare Other | Source: Ambulatory Visit | Attending: Family Medicine | Admitting: Family Medicine

## 2021-07-27 ENCOUNTER — Other Ambulatory Visit: Payer: Self-pay

## 2021-07-27 DIAGNOSIS — M47819 Spondylosis without myelopathy or radiculopathy, site unspecified: Secondary | ICD-10-CM | POA: Diagnosis not present

## 2021-07-27 DIAGNOSIS — M5416 Radiculopathy, lumbar region: Secondary | ICD-10-CM | POA: Diagnosis not present

## 2021-07-27 DIAGNOSIS — M4316 Spondylolisthesis, lumbar region: Secondary | ICD-10-CM | POA: Diagnosis not present

## 2021-07-27 DIAGNOSIS — M5137 Other intervertebral disc degeneration, lumbosacral region: Secondary | ICD-10-CM | POA: Diagnosis not present

## 2021-07-27 DIAGNOSIS — M47816 Spondylosis without myelopathy or radiculopathy, lumbar region: Secondary | ICD-10-CM | POA: Diagnosis not present

## 2021-08-05 DIAGNOSIS — I639 Cerebral infarction, unspecified: Secondary | ICD-10-CM | POA: Diagnosis not present

## 2021-08-06 ENCOUNTER — Encounter: Payer: Self-pay | Admitting: Family Medicine

## 2021-08-06 ENCOUNTER — Telehealth (INDEPENDENT_AMBULATORY_CARE_PROVIDER_SITE_OTHER): Payer: Medicare Other | Admitting: Family Medicine

## 2021-08-06 DIAGNOSIS — M5416 Radiculopathy, lumbar region: Secondary | ICD-10-CM

## 2021-08-06 DIAGNOSIS — M47819 Spondylosis without myelopathy or radiculopathy, site unspecified: Secondary | ICD-10-CM | POA: Diagnosis not present

## 2021-08-06 MED ORDER — DIAZEPAM 5 MG PO TABS: ORAL_TABLET | ORAL | 0 refills | Status: DC

## 2021-08-06 NOTE — Assessment & Plan Note (Signed)
Acutely occurring.  MRI was demonstrating impingement on the nerve. ?-Counseled on home exercise therapy and supportive care. ?-Epidural. ?

## 2021-08-06 NOTE — Assessment & Plan Note (Signed)
Having facet changes at the L4-5 and L5-S1.  She is still having low back pain which is likely contributing to some of her pain. ?-Could pursue facet injections going forward. ?

## 2021-08-06 NOTE — Progress Notes (Signed)
Virtual Visit via Video Note ? ?I connected with [name] on 08/06/21 at  1:30 PM EST by a video enabled telemedicine application and verified that I am speaking with the correct person using two identifiers. ? ?Location: ?Patient: home ?Provider: office ?  ?I discussed the limitations of evaluation and management by telemedicine and the availability of in person appointments. The patient expressed understanding and agreed to proceed. ? ?History of Present Illness: ? ?Ms. Labree is a 51 year old female that is following up after the MRI of her lumbar spine.  She continues to have low back pain as well as radicular pain.  The MRI was revealing for L4-L5 worsening bilateral facet arthropathy and anterolisthesis.  No compressive stenosis.  She is having worsening degenerative disc disease at L5-S1 with a slight indentation of the thecal sac contacting both exiting S1 root sleeves. ? ?Observations/Objective: ? ? ?Assessment and Plan: ? ?Lumbar radiculopathy: ?Acutely occurring.  MRI was demonstrating impingement on the nerve. ?-Counseled on home exercise therapy and supportive care. ?-Epidural. ? ?Facet arthropathy: ?Having facet changes at the L4-5 and L5-S1.  She is still having low back pain which is likely contributing to some of her pain. ?-Could pursue facet injections going forward. ? ?Follow Up Instructions: ? ?  ?I discussed the assessment and treatment plan with the patient. The patient was provided an opportunity to ask questions and all were answered. The patient agreed with the plan and demonstrated an understanding of the instructions. ?  ?The patient was advised to call back or seek an in-person evaluation if the symptoms worsen or if the condition fails to improve as anticipated. ? ? ? ?Clearance Coots, MD ? ? ?

## 2021-08-07 ENCOUNTER — Other Ambulatory Visit: Payer: Self-pay | Admitting: Family Medicine

## 2021-08-19 ENCOUNTER — Other Ambulatory Visit: Payer: Self-pay | Admitting: Family Medicine

## 2021-08-19 ENCOUNTER — Telehealth: Payer: Self-pay | Admitting: Family Medicine

## 2021-08-19 ENCOUNTER — Other Ambulatory Visit: Payer: Self-pay | Admitting: Neurology

## 2021-08-20 NOTE — Telephone Encounter (Signed)
Requested Prescriptions  ?Pending Prescriptions Disp Refills  ?? traZODone (DESYREL) 100 MG tablet [Pharmacy Med Name: TRAZODONE HCL 100 MG TAB] 90 tablet 1  ?  Sig: TAKE 1 TABLET BY MOUTH EVERY NIGHT AT BEDTIME  ?  ? Psychiatry: Antidepressants - Serotonin Modulator Passed - 08/19/2021 10:35 AM  ?  ?  Passed - Completed PHQ-2 or PHQ-9 in the last 360 days  ?  ?  Passed - Valid encounter within last 6 months  ?  Recent Outpatient Visits   ?      ? 2 months ago Primary hypertension  ? [place] [name], NP  ? 5 months ago Encounter for annual physical exam  ? [name], [place] W, NP  ? 6 months ago Neuropathy  ? [name], [place] W, NP  ? 8 months ago Primary hypertension  ? [name], [name], NP  ? 1 year ago Essential hypertension  ? [place] [name], NP  ?  ?  ?Future Appointments   ?        ? In 1 month [name], NP [name]  ?  ? ?  ?  ?  ? ?

## 2021-08-26 NOTE — Telephone Encounter (Signed)
Pt cld states she does not want to do the Epidural & ask what is the next step- ? ?--forwarding message to nurse to review w/ Dr.Schmitz. ?--glh ?

## 2021-08-27 ENCOUNTER — Telehealth: Payer: Self-pay | Admitting: Family Medicine

## 2021-08-27 DIAGNOSIS — M5416 Radiculopathy, lumbar region: Secondary | ICD-10-CM

## 2021-08-27 MED ORDER — HYDROCODONE-ACETAMINOPHEN 5-325 MG PO TABS: 1.0000 | ORAL_TABLET | Freq: Three times a day (TID) | ORAL | 0 refills | Status: DC | PRN

## 2021-08-27 NOTE — Telephone Encounter (Signed)
Patient to restart PT and provided norco as needed.  ? ?Rosemarie Ax, MD ?[facility] ?08/27/2021, 4:47 PM ? ?

## 2021-08-27 NOTE — Telephone Encounter (Signed)
Patient called back state wants to Re-start Physical therapy & also ask for refill of:  ?HYDROcodone-acetaminophen (NORCO/VICODIN) 5-325 MG tablet [[id number]]  ?  Order Details ?Dose: 1 tablet Route: Oral Frequency: Every 8 hours PRN  ?Dispense Quantity: 15 tablet Refills: 0   ?     ?Sig: Take 1 tablet by mouth every 8 (eight) hours as needed  ? ?-Pt uses :  ?Pharmacy ? ?[place], [place] - [address]  ?[place], [place] [address]  ?Phone:  [phone]  Fax:  [phone]   ?--glh ?

## 2021-08-27 NOTE — Telephone Encounter (Signed)
Left detailed message informing pt she can start back PT per Dr. Raeford Razor. I informed her to call us back if we need to place a new PT order.  ?

## 2021-09-05 DIAGNOSIS — I639 Cerebral infarction, unspecified: Secondary | ICD-10-CM | POA: Diagnosis not present

## 2021-09-10 ENCOUNTER — Ambulatory Visit: Payer: Medicare Other | Attending: Family Medicine | Admitting: Physical Therapy

## 2021-09-10 DIAGNOSIS — R252 Cramp and spasm: Secondary | ICD-10-CM | POA: Insufficient documentation

## 2021-09-10 DIAGNOSIS — R2681 Unsteadiness on feet: Secondary | ICD-10-CM | POA: Insufficient documentation

## 2021-09-10 DIAGNOSIS — M5441 Lumbago with sciatica, right side: Secondary | ICD-10-CM | POA: Insufficient documentation

## 2021-09-10 DIAGNOSIS — M5416 Radiculopathy, lumbar region: Secondary | ICD-10-CM | POA: Insufficient documentation

## 2021-09-10 DIAGNOSIS — M6281 Muscle weakness (generalized): Secondary | ICD-10-CM | POA: Insufficient documentation

## 2021-09-10 DIAGNOSIS — M5442 Lumbago with sciatica, left side: Secondary | ICD-10-CM | POA: Insufficient documentation

## 2021-09-17 ENCOUNTER — Ambulatory Visit: Payer: Medicare Other | Admitting: Physical Therapy

## 2021-09-17 DIAGNOSIS — M5416 Radiculopathy, lumbar region: Secondary | ICD-10-CM

## 2021-09-17 DIAGNOSIS — M6281 Muscle weakness (generalized): Secondary | ICD-10-CM

## 2021-09-17 DIAGNOSIS — R252 Cramp and spasm: Secondary | ICD-10-CM | POA: Diagnosis not present

## 2021-09-17 DIAGNOSIS — R2681 Unsteadiness on feet: Secondary | ICD-10-CM | POA: Diagnosis not present

## 2021-09-17 DIAGNOSIS — M5442 Lumbago with sciatica, left side: Secondary | ICD-10-CM | POA: Diagnosis not present

## 2021-09-17 DIAGNOSIS — M5441 Lumbago with sciatica, right side: Secondary | ICD-10-CM | POA: Diagnosis not present

## 2021-09-17 NOTE — Therapy (Signed)
[place] [facility] ?[place], [place], [address] ?Phone: [phone]   Fax:  [phone] ? ?Physical Therapy Evaluation ? ?Patient Details  ?Name: Matika Bartell ?MRN: [id number] ?Date of Birth: 12/13/1970 ?Referring Provider (PT): Clearance Coots ? ? ?Encounter Date: 09/17/2021 ? ? PT End of Session - 09/17/21 0800   ? ? Visit Number 1   ? Number of Visits 12   ? Date for PT Re-Evaluation 10/29/21   ? Authorization Type UHC MCR   ? PT Start Time 0800   ? PT Stop Time 9163   ? PT Time Calculation (min) 49 min   ? Activity Tolerance --   ? Behavior During Therapy --   ? ?  ?  ? ?  ? ? ?Past Medical History:  ?Diagnosis Date  ? Anemia   ? Anxiety   ? Aortic regurgitation   ? Cardiomyopathy due to hypertension ([place]) 10/23/2014  ? Chest discomfort 10/12/2018  ? Chronic ischemic vertebrobasilar artery thalamic stroke   ? Depression   ? Essential hypertension 05/02/2015  ? Former smoker 01/24/2015  ? H/O noncompliance with medical treatment, presenting hazards to health   ? Headache 05/01/2015  ? History of ischemic middle cerebral artery stroke embolic   ? Hyperlipidemia   ? Hypertension   ? Hypertensive heart disease   ? Migraine 05/21/2018  ? Migraines   ? Nonrheumatic aortic (valve) insufficiency   ? Osteoarthritis   ? Overweight (BMI 25.0-29.9)   ? Palpitations 10/12/2018  ? Sleep apnea   ? Snoring 05/02/2015  ? Stroke ([place]) 10/25/2016  ? Syncope and collapse 10/07/2018  ? ? ?Past Surgical History:  ?Procedure Laterality Date  ? CESAREAN SECTION    ? GANGLION CYST EXCISION    ? TEE WITHOUT CARDIOVERSION N/A 10/09/2014  ? Procedure: TRANSESOPHAGEAL ECHOCARDIOGRAM (TEE);  Surgeon: Sanda Klein, MD;  Location: [facility] ENDOSCOPY;  Service: Cardiovascular;  Laterality: N/A;  ? TONSILLECTOMY    ? ? ?There were no vitals filed for this visit. ? ? ? Subjective Assessment - 09/17/21 0805   ? ? Subjective Patient had MRI and shows she has arthritis in her low back.  She does not want to get the injection. She still is having back pain but not as bad as when she was here last time.   ? Pertinent History CVA, MI, OA, anxiety/depression   ? How long can you walk comfortably? Can't walk to the mail box due to pain   ? Patient Stated Goals to walk better and further with less pain   ? Currently in Pain? Yes   ? Pain Score 3    ? Pain Location Back   ? Pain Orientation Lower   ? Pain Descriptors / Indicators Aching   ? Pain Type Chronic pain   ? Pain Radiating Towards intermittently into bil legs to toes   ? Pain Onset More than a month ago   ? Pain Frequency Constant   ? Aggravating Factors  standing and walking   ? Pain Relieving Factors sitting   ? Effect of Pain on Daily Activities all ADLS affected   ? ?  ?  ? ?  ? ? ? ? ? [facility] PT Assessment - 09/17/21 0001   ? ?  ? Assessment  ? Medical Diagnosis lumbar radiculopathy   ? Referring Provider (PT) Clearance Coots   ? Onset Date/Surgical Date 08/31/21   ? Hand Dominance Right   ? Prior Therapy yes  discharged in January   ?  ? Precautions  ? Precautions Fall   ?  ? Balance Screen  ? Has the patient fallen in the past 6 months Yes   ? How many times? 1   tripped in house on carpet  ? Has the patient had a decrease in activity level because of a fear of falling?  Yes   ? Is the patient reluctant to leave their home because of a fear of falling?  No   ?  ? Home Environment  ? Living Environment Private residence   ? Living Arrangements Children   ? Available Help at Discharge Family   ? Type of Home Apartment   ? Home Access Level entry   ? Home Layout One level   ? [place] - quad;Walker - 4 wheels   ?  ? Prior Function  ? Level of Independence Requires assistive device for independence   has nurse in daily  ?  ? Observation/Other Assessments  ? Focus on Therapeutic Outcomes (FOTO)  FS = 46 (predicted 52)   ?  ? ROM / Strength  ? AROM / PROM / Strength AROM;Strength   ?  ? AROM  ? Overall AROM Comments full lumbar flex and  right SB; 75% extension and LSB, 50% rotation (may be due to balance deficits   ?  ? Strength  ? Overall Strength Comments Right hip flex and knee flex/ext 5/5   ? Strength Assessment Site Hip;Knee   ? Right/Left Hip Left   ? Left Hip Flexion 2+/5   ? Right/Left Knee Left   ? Left Knee Flexion 3/5   ? Left Knee Extension 3/5   ?  ? Palpation  ? Palpation comment tender in right gluteals   ?  ? Special Tests  ? Other special tests SLS R LE  = < 3 sec   ?  ? Ambulation/Gait  ? Ambulation/Gait Yes   ? Ambulation/Gait Assistance 7: Independent   ? Ambulation Distance (Feet) 40 Feet   ? Assistive device Large base quad cane;None   ? Gait Pattern Step-through pattern;Decreased step length - right;Decreased stance time - left;Wide base of support   ? Ambulation Surface Level   ?  ? Balance  ? Balance Assessed Yes   ?  ? Standardized Balance Assessment  ? Standardized Balance Assessment Timed Up and Go Test;Five Times Sit to Stand   ? Five times sit to stand comments  12.11 sec   ?  ? Timed Up and Go Test  ? TUG Normal TUG   ? Normal TUG (seconds) 20   ? TUG Comments with quad cane   ?  ? Functional Gait  Assessment  ? Gait assessed  Yes   ? Gait Level Surface Walks 20 ft, slow speed, abnormal gait pattern, evidence for imbalance or deviates 10-15 in outside of the 12 in walkway width. Requires more than 7 sec to ambulate 20 ft.   ? Change in Gait Speed Able to change speed, demonstrates mild gait deviations, deviates 6-10 in outside of the 12 in walkway width, or no gait deviations, unable to achieve a major change in velocity, or uses a change in velocity, or uses an assistive device.   ? Gait with Horizontal Head Turns Performs head turns smoothly with no change in gait. Deviates no more than 6 in outside 12 in walkway width   ? Gait with Vertical Head Turns Performs task with slight change in gait velocity (  eg, minor disruption to smooth gait path), deviates 6 - 10 in outside 12 in walkway width or uses assistive device    ? Gait and Pivot Turn Pivot turns safely within 3 sec and stops quickly with no loss of balance.   ? Step Over Obstacle Is able to step over one shoe box (4.5 in total height) but must slow down and adjust steps to clear box safely. May require verbal cueing.   ? Gait with Narrow Base of Support Ambulates less than 4 steps heel to toe or cannot perform without assistance.   ? Gait with Eyes Closed Cannot walk 20 ft without assistance, severe gait deviations or imbalance, deviates greater than 15 in outside 12 in walkway width or will not attempt task.   ? Ambulating Backwards Walks 20 ft, slow speed, abnormal gait pattern, evidence for imbalance, deviates 10-15 in outside 12 in walkway width.   ? Steps Alternating feet, must use rail.   ? Total Score 15   ? FGA comment: goal 81 for age   ? ?  ?  ? ?  ? ? ? ? ? ? ? ? ? ? ? ? ? ?Objective measurements completed on examination: See above findings.  ? ? ? ? ? ? ? ? ? ? ? ? ? ? ? ? PT Short Term Goals - 09/17/21 0918   ? ?  ? PT SHORT TERM GOAL #1  ? Title Patient to be independent with initial HEP.   ? Baseline previous HEP needs to be reviewed   ? Time 2   ? Period Weeks   ? Status New   ? Target Date 10/01/21   ?  ? PT SHORT TERM GOAL #2  ? Title Improved sit to stand to < 10 sec   ? Baseline 12.11 sec without UE support   ? Time 4   ? Period Weeks   ? Status New   ?  ? PT SHORT TERM GOAL #3  ? Title -   ?  ? PT SHORT TERM GOAL #4  ? Title -   ?  ? PT SHORT TERM GOAL #5  ? Title -   ? ?  ?  ? ?  ? ? ? ? PT Long Term Goals - 09/17/21 0920   ? ?  ? PT LONG TERM GOAL #1  ? Title Patient to be independent with advanced HEP.   ? Baseline has previous HEP from last episode   ? Time 6   ? Period Weeks   ? Status New   ? Target Date 10/29/21   ?  ? PT LONG TERM GOAL #2  ? Title Patient to demonstrate improved FGA to 25   ? Baseline FGA = 15   ? Time 6   ? Period Weeks   ? Target Date 10/29/21   ?  ? PT LONG TERM GOAL #3  ? Title Patient will report 50% improvement in  LBP/radicular symptoms.   ? Baseline 3/10 pain at eval   ? Time 6   ? Period Weeks   ? Status New   ? Target Date 10/29/21   ?  ? PT LONG TERM GOAL #4  ? Title Patient will tolerated standing/walking for 5 mi

## 2021-09-20 ENCOUNTER — Ambulatory Visit: Payer: Commercial Managed Care - HMO | Admitting: Nurse Practitioner

## 2021-09-24 ENCOUNTER — Ambulatory Visit: Payer: Medicare Other | Admitting: Physical Therapy

## 2021-09-26 ENCOUNTER — Ambulatory Visit: Payer: Medicare Other | Admitting: Physical Therapy

## 2021-09-26 ENCOUNTER — Encounter: Payer: Self-pay | Admitting: Physical Therapy

## 2021-09-26 DIAGNOSIS — M6281 Muscle weakness (generalized): Secondary | ICD-10-CM

## 2021-09-26 DIAGNOSIS — R2681 Unsteadiness on feet: Secondary | ICD-10-CM | POA: Diagnosis not present

## 2021-09-26 DIAGNOSIS — M5416 Radiculopathy, lumbar region: Secondary | ICD-10-CM | POA: Diagnosis not present

## 2021-09-26 DIAGNOSIS — M5441 Lumbago with sciatica, right side: Secondary | ICD-10-CM | POA: Diagnosis not present

## 2021-09-26 DIAGNOSIS — M5442 Lumbago with sciatica, left side: Secondary | ICD-10-CM | POA: Diagnosis not present

## 2021-09-26 DIAGNOSIS — R252 Cramp and spasm: Secondary | ICD-10-CM | POA: Diagnosis not present

## 2021-09-26 NOTE — Therapy (Signed)
[place] [facility] ?[place] ?[place], [place], [address] ?Phone: [phone]   Fax:  [phone] ? ?Physical Therapy Treatment ? ?Patient Details  ?Name: Brandy Roberts ?MRN: [id number] ?Date of Birth: 06-27-1970 ?Referring Provider (PT): Clearance Coots ? ? ?Encounter Date: 09/26/2021 ? ? PT End of Session - 09/26/21 0804   ? ? Visit Number 2   ? Number of Visits 12   ? Date for PT Re-Evaluation 10/29/21   ? Authorization Type UHC MCR   ? PT Start Time [phone]   ? PT Stop Time 0844   ? PT Time Calculation (min) 41 min   ? ?  ?  ? ?  ? ? ?Past Medical History:  ?Diagnosis Date  ? Anemia   ? Anxiety   ? Aortic regurgitation   ? Cardiomyopathy due to hypertension ([place]) 10/23/2014  ? Chest discomfort 10/12/2018  ? Chronic ischemic vertebrobasilar artery thalamic stroke   ? Depression   ? Essential hypertension 05/02/2015  ? Former smoker 01/24/2015  ? H/O noncompliance with medical treatment, presenting hazards to health   ? Headache 05/01/2015  ? History of ischemic middle cerebral artery stroke embolic   ? Hyperlipidemia   ? Hypertension   ? Hypertensive heart disease   ? Migraine 05/21/2018  ? Migraines   ? Nonrheumatic aortic (valve) insufficiency   ? Osteoarthritis   ? Overweight (BMI 25.0-29.9)   ? Palpitations 10/12/2018  ? Sleep apnea   ? Snoring 05/02/2015  ? Stroke ([place]) 10/25/2016  ? Syncope and collapse 10/07/2018  ? ? ?Past Surgical History:  ?Procedure Laterality Date  ? CESAREAN SECTION    ? GANGLION CYST EXCISION    ? TEE WITHOUT CARDIOVERSION N/A 10/09/2014  ? Procedure: TRANSESOPHAGEAL ECHOCARDIOGRAM (TEE);  Surgeon: Sanda Klein, MD;  Location: [facility] ENDOSCOPY;  Service: Cardiovascular;  Laterality: N/A;  ? TONSILLECTOMY    ? ? ?There were no vitals filed for this visit. ? ? Subjective Assessment - 09/26/21 0821   ? ? Subjective "My back isn't hurting me today, my ear is killing me."   ? Pertinent History CVA, MI, OA, anxiety/depression   ? How long  can you walk comfortably? Can't walk to the mail box due to pain   ? Patient Stated Goals to walk better and further with less pain   ? Currently in Pain? Yes   ? Pain Score 9    ? Pain Location Ear   ? Pain Orientation Left   ? Pain Onset More than a month ago   ? ?  ?  ? ?  ? ? ? ? ? ? ? ? ? ? ? ? ? ? ? ? ? ? ? ? [place] [facility] PT Treatment/Exercise - 09/26/21 0001   ? ?  ? Exercises  ? Exercises Lumbar   ?  ? Lumbar Exercises: Aerobic  ? Nustep L4 x 6 min   ?  ? Lumbar Exercises: Standing  ? Heel Raises 10 reps   ? Other Standing Lumbar Exercises standing hip extension x 10 bil, hip abduction x 10 bil   ? Other Standing Lumbar Exercises standing side steps x 10 bil, step foward x 10 bil, stepping back x 10 bil with chair for support.   ?  ? Lumbar Exercises: Seated  ? Long [employer] on Chair Right;Left;10 reps   ? Sit to Stand 15 reps   ? Sit to Stand Limitations x10, x 5 with UE support   ?  Other Seated Lumbar Exercises seated foward bends, trunk twists, and arm raises in between standing exercises.   ? ?  ?  ? ?  ? ? ? ? ? ? ? ? ? ? PT Education - 09/26/21 1059   ? ? Education Details Access Code: [email or web address]  HEP to alternate sitting and standing.   ? Person(s) Educated Patient   ? Methods Explanation;Demonstration;Verbal cues;Handout   ? Comprehension Verbalized understanding;Returned demonstration   ? ?  ?  ? ?  ? ? ? PT Short Term Goals - 09/26/21 1057   ? ?  ? PT SHORT TERM GOAL #1  ? Title Patient to be independent with initial HEP.   ? Baseline previous HEP needs to be reviewed   ? Time 2   ? Period Weeks   ? Status On-going   updated HEP to alternate sitting and standing exercises.  ? Target Date 10/01/21   ?  ? PT SHORT TERM GOAL #2  ? Title Improved sit to stand to < 10 sec   ? Baseline 12.11 sec without UE support   ? Time 4   ? Period Weeks   ? Status On-going   ?  ? PT SHORT TERM GOAL #3  ? Title -   ?  ? PT SHORT TERM GOAL #4  ? Title -   ?  ? PT SHORT TERM GOAL #5  ? Title -   ? ?  ?  ? ?  ? ? ? ? PT  Long Term Goals - 09/26/21 1057   ? ?  ? PT LONG TERM GOAL #1  ? Title Patient to be independent with advanced HEP.   ? Baseline has previous HEP from last episode   ? Time 6   ? Period Weeks   ? Status On-going   ? Target Date 10/29/21   ?  ? PT LONG TERM GOAL #2  ? Title Patient to demonstrate improved FGA to 25   ? Baseline FGA = 15   ? Time 6   ? Period Weeks   ? Status On-going   ? Target Date 10/29/21   ?  ? PT LONG TERM GOAL #3  ? Title Patient will report 50% improvement in LBP/radicular symptoms.   ? Baseline 3/10 pain at eval   ? Time 6   ? Period Weeks   ? Status On-going   ? Target Date 10/29/21   ?  ? PT LONG TERM GOAL #4  ? Title Patient will tolerated standing/walking for 5 minutes without severe LBP/bil LE to perform ADLs   ? Baseline cannot walk to mailbox due to pain.   ? Time 6   ? Period Weeks   ? Status On-going   ? Target Date 10/29/21   ?  ? PT LONG TERM GOAL #5  ? Title Patient will demonstrate improved physical functioning by improving FOTO score to 52.   ? Baseline FOTO = 46   ? Time 6   ? Period Weeks   ? Status On-going   ? Target Date 10/29/21   ? ?  ?  ? ?  ? ? ? ? ? ? ? ? Plan - 09/26/21 1053   ? ? Clinical Impression Statement Patient was initially very disengaged from therapy, having to be asked to put phone away even.  She was also very distracted by ear pain.  However, when started focusing on music and more dance based standing exercises, she became much more engaged.  Given chair exercises today to combine sitting and standing with music at home, with goal of exercising for 10-15 minutes as tolerated.  She would benefit from continued skilled therapy.   ? Personal Factors and Comorbidities Comorbidity 3+   ? Comorbidities LBP, CVA affectiing left side, anxiety/depression   ? Examination-Activity Limitations Locomotion Level   ? Stability/Clinical Decision Making Stable/Uncomplicated   ? Rehab Potential Good   ? PT Frequency 2x / week   ? PT Duration 6 weeks   ? PT  Treatment/Interventions ADLs/Self Care Home Management;Aquatic Therapy;Electrical Stimulation;Moist Heat;Cryotherapy;Traction;Neuromuscular re-education;Balance training;Therapeutic exercise;Therapeutic activities;Gait training;Patient/family education;Manual techniques;Dry needling   ? PT Next Visit Plan Assess 6 min walk test and set goal; Give TENs handout; work on balance/gait and functional strength, review HEP   ? PT Home Exercise Plan [phone] (from previous episode)   ? Consulted and Agree with Plan of Care Patient   ? ?  ?  ? ?  ? ? ?Patient will benefit from skilled therapeutic intervention in order to improve the following deficits and impairments:  Abnormal gait, Decreased range of motion, Pain, Increased muscle spasms, Decreased activity tolerance, Decreased strength, Decreased balance ? ?Visit Diagnosis: ?Radiculopathy, lumbar region ? ?Unsteadiness on feet ? ?Cramp and spasm ? ?Muscle weakness (generalized) ? ?Acute midline low back pain with bilateral sciatica ? ? ? ? ?Problem List ?Patient Active Problem List  ? Diagnosis Date Noted  ? Vitamin D deficiency 05/15/2021  ? Facet arthropathy 05/15/2021  ? SI joint arthritis 05/15/2021  ? Lumbar radiculopathy 05/01/2021  ? Obesity (BMI 35.0-39.9 without comorbidity) 11/26/2020  ? Mixed dyslipidemia 11/26/2020  ? Anemia   ? Anxiety   ? Depression   ? Hypertension   ? Migraines   ? Nonrheumatic aortic (valve) insufficiency   ? Osteoarthritis   ? Sleep apnea   ? Palpitations 10/12/2018  ? Chest discomfort 10/12/2018  ? Syncope and collapse 10/07/2018  ? Migraine 05/21/2018  ? Stroke ([place]) 10/25/2016  ? Snoring 05/02/2015  ? Essential hypertension 05/02/2015  ? Headache 05/01/2015  ? Former smoker 01/24/2015  ? Cardiomyopathy due to hypertension ([place]) 10/23/2014  ? Chronic ischemic vertebrobasilar artery thalamic stroke   ? Hypertensive heart disease   ? Overweight (BMI 25.0-29.9)   ? H/O noncompliance with medical treatment, presenting hazards to health   ?  Aortic regurgitation   ? History of ischemic middle cerebral artery stroke embolic   ? Hyperlipidemia   ? ? ?Rennie Natter, PT, DPT  ?09/26/2021, 11:01 AM ? ?[place] [facility]

## 2021-09-26 NOTE — Patient Instructions (Signed)
Access Code: [email or web address] ?URL: [email or web address] ?Date: 09/26/2021 ?Prepared by: Glenetta Hew ? ?Exercises ?- Sit to Stand  - 1 x daily - 7 x weekly - 10 reps ?- Step Sideways  - 1 x daily - 7 x weekly - 10 reps ?- Alternating Step Backward with Support  - 1 x daily - 7 x weekly - 10 reps ?- Alternating Step Forward with Support  - 1 x daily - 7 x weekly - 10 reps ?- Standing Knee Flexion AROM with Chair Support  - 1 x daily - 7 x weekly - 10 reps ?- Standing Hip Extension with Chair  - 1 x daily - 7 x weekly - 10 reps ?- Seated March  - 1 x daily - 7 x weekly - 10 reps ?- Shifting Weight with Kick Out While Sitting  - 1 x daily - 7 x weekly - 10 reps ?

## 2021-10-01 ENCOUNTER — Other Ambulatory Visit: Payer: Self-pay | Admitting: Family Medicine

## 2021-10-01 ENCOUNTER — Ambulatory Visit: Payer: Medicare Other | Attending: Family Medicine | Admitting: Physical Therapy

## 2021-10-01 DIAGNOSIS — R2681 Unsteadiness on feet: Secondary | ICD-10-CM | POA: Insufficient documentation

## 2021-10-01 DIAGNOSIS — M6281 Muscle weakness (generalized): Secondary | ICD-10-CM | POA: Insufficient documentation

## 2021-10-01 DIAGNOSIS — M5441 Lumbago with sciatica, right side: Secondary | ICD-10-CM | POA: Insufficient documentation

## 2021-10-01 DIAGNOSIS — R262 Difficulty in walking, not elsewhere classified: Secondary | ICD-10-CM | POA: Insufficient documentation

## 2021-10-01 DIAGNOSIS — M5416 Radiculopathy, lumbar region: Secondary | ICD-10-CM

## 2021-10-01 DIAGNOSIS — M5442 Lumbago with sciatica, left side: Secondary | ICD-10-CM | POA: Insufficient documentation

## 2021-10-01 DIAGNOSIS — R2689 Other abnormalities of gait and mobility: Secondary | ICD-10-CM | POA: Insufficient documentation

## 2021-10-01 DIAGNOSIS — R252 Cramp and spasm: Secondary | ICD-10-CM | POA: Insufficient documentation

## 2021-10-03 ENCOUNTER — Other Ambulatory Visit: Payer: Self-pay | Admitting: Family Medicine

## 2021-10-03 DIAGNOSIS — M5416 Radiculopathy, lumbar region: Secondary | ICD-10-CM

## 2021-10-05 DIAGNOSIS — I639 Cerebral infarction, unspecified: Secondary | ICD-10-CM | POA: Diagnosis not present

## 2021-10-08 NOTE — Therapy (Signed)
?OUTPATIENT PHYSICAL THERAPY TREATMENT NOTE ? ? ?Patient Name: Brandy Roberts ?MRN: [id number] ?DOB:Jul 17, 1970, 51 y.o., female ?Today's Date: 10/09/2021 ? ?PCP: [name], NP ?REFERRING PROVIDER: Rosemarie Ax, MD ? ? PT End of Session - 10/09/21 0853   ? ? Visit Number 3   ? Number of Visits 12   ? Date for PT Re-Evaluation 10/29/21   ? Authorization Type UHC MCR   ? PT Start Time [phone]   ? PT Stop Time 9381   ? PT Time Calculation (min) 41 min   ? Activity Tolerance Patient tolerated treatment well   ? Behavior During Therapy [facility] for tasks assessed/performed   ? ?  ?  ? ?  ? ? ?Past Medical History:  ?Diagnosis Date  ? Anemia   ? Anxiety   ? Aortic regurgitation   ? Cardiomyopathy due to hypertension ([place]) 10/23/2014  ? Chest discomfort 10/12/2018  ? Chronic ischemic vertebrobasilar artery thalamic stroke   ? Depression   ? Essential hypertension 05/02/2015  ? Former smoker 01/24/2015  ? H/O noncompliance with medical treatment, presenting hazards to health   ? Headache 05/01/2015  ? History of ischemic middle cerebral artery stroke embolic   ? Hyperlipidemia   ? Hypertension   ? Hypertensive heart disease   ? Migraine 05/21/2018  ? Migraines   ? Nonrheumatic aortic (valve) insufficiency   ? Osteoarthritis   ? Overweight (BMI 25.0-29.9)   ? Palpitations 10/12/2018  ? Sleep apnea   ? Snoring 05/02/2015  ? Stroke ([place]) 10/25/2016  ? Syncope and collapse 10/07/2018  ? ?Past Surgical History:  ?Procedure Laterality Date  ? CESAREAN SECTION    ? GANGLION CYST EXCISION    ? TEE WITHOUT CARDIOVERSION N/A 10/09/2014  ? Procedure: TRANSESOPHAGEAL ECHOCARDIOGRAM (TEE);  Surgeon: Sanda Klein, MD;  Location: [facility] ENDOSCOPY;  Service: Cardiovascular;  Laterality: N/A;  ? TONSILLECTOMY    ? ?Patient Active Problem List  ? Diagnosis Date Noted  ? Vitamin D deficiency 05/15/2021  ? Facet arthropathy 05/15/2021  ? SI joint arthritis 05/15/2021  ? Lumbar radiculopathy 05/01/2021  ? Obesity (BMI 35.0-39.9 without comorbidity)  11/26/2020  ? Mixed dyslipidemia 11/26/2020  ? Anemia   ? Anxiety   ? Depression   ? Hypertension   ? Migraines   ? Nonrheumatic aortic (valve) insufficiency   ? Osteoarthritis   ? Sleep apnea   ? Palpitations 10/12/2018  ? Chest discomfort 10/12/2018  ? Syncope and collapse 10/07/2018  ? Migraine 05/21/2018  ? Stroke ([place]) 10/25/2016  ? Snoring 05/02/2015  ? Essential hypertension 05/02/2015  ? Headache 05/01/2015  ? Former smoker 01/24/2015  ? Cardiomyopathy due to hypertension ([place]) 10/23/2014  ? Chronic ischemic vertebrobasilar artery thalamic stroke   ? Hypertensive heart disease   ? Overweight (BMI 25.0-29.9)   ? H/O noncompliance with medical treatment, presenting hazards to health   ? Aortic regurgitation   ? History of ischemic middle cerebral artery stroke embolic   ? Hyperlipidemia   ? ? ?REFERRING DIAG: lumbar radiculopathy  ? ?THERAPY DIAG:  ?Radiculopathy, lumbar region ? ?Unsteadiness on feet ? ?Cramp and spasm ? ?Muscle weakness (generalized) ? ?Acute midline low back pain with bilateral sciatica ? ?PERTINENT HISTORY: CVA, MI, OA, anxiety/depression ? ?PRECAUTIONS: Fall ? ?SUBJECTIVE: [place] and [place] right [place]. She tripped over something on the floor.  ? ?PAIN:  ?Are you having pain? Yes: NPRS scale: 9/10 ?Pain location: right foot ?Pain description: sharp ?Aggravating factors: hitting it ?  Relieving factors: unsure ? ?Are you having pain? Yes: NPRS scale: 6/10 ?Pain location: back ?Pain description: ache ?Aggravating factors:  ?Relieving factors:  ? ? ?TODAY'S TREATMENT:  ?10/09/21 ?Manual: assessment of foot ?Seated R ankle mobility: PF/DF,inv/ever, circles cw/ccw x 10 ea, D2 flex x 10 ? ?Bridging x 10 cues for higher raise ?Supine clam GTB x 10; unilateral x 10 bil; marching GTB x 10 ea  ?SDLY hip ABD GTB R x 10 ?Prone: left quad sets x 15, then 5 x 5 sec hold, R 10 x 5 sec hold; unable to do SLR in prone bil; HS curl R 2x5 RTB ?SDLY hip extension 2x5 with  PT helping to stabilize hips ? ? ?PATIENT EDUCATION: ?Education details: Ankle exercises advised to keep foot moving. ?Person educated: Patient ?Education method: Explanation and Demonstration ?Education comprehension: verbalized understanding and returned demonstration ? ? ?[place]: ?[id number] ? ? PT Short Term Goals - Target Date 10/01/21  ? ?  ? PT SHORT TERM GOAL #1  ? Title Patient to be independent with initial HEP.   ? Baseline previous HEP needs to be reviewed   ? Time 2   ? Period Weeks   ? Status On-going   updated HEP to alternate sitting and standing exercises.  ? Target Date 10/01/21   ?  ? PT SHORT TERM GOAL #2  ? Title Improved sit to stand to < 10 sec   ? Baseline 12.11 sec without UE support   ? Time 4   ? Period Weeks   ? Status On-going   ?  ?   ?  -   ?  ?   ?  -   ?  ?   ?  -   ? ?  ?  ? ?  ? ? ? PT Long Term Goals - Target Date 10/29/21   ? ?  ? PT LONG TERM GOAL #1  ? Title Patient to be independent with advanced HEP.   ? Baseline has previous HEP from last episode   ? Time 6   ? Period Weeks   ? Status On-going   ? Target Date 10/29/21   ?  ? PT LONG TERM GOAL #2  ? Title Patient to demonstrate improved FGA to 25   ? Baseline FGA = 15   ? Time 6   ? Period Weeks   ? Status On-going   ? Target Date 10/29/21   ?  ? PT LONG TERM GOAL #3  ? Title Patient will report 50% improvement in LBP/radicular symptoms.   ? Baseline 3/10 pain at eval   ? Time 6   ? Period Weeks   ? Status On-going   ? Target Date 10/29/21   ?  ? PT LONG TERM GOAL #4  ? Title Patient will tolerated standing/walking for 5 minutes without severe LBP/bil LE to perform ADLs   ? Baseline cannot walk to mailbox due to pain.   ? Time 6   ? Period Weeks   ? Status On-going   ? Target Date 10/29/21   ?  ? PT LONG TERM GOAL #5  ? Title Patient will demonstrate improved physical functioning by improving FOTO score to 52.   ? Baseline FOTO = 46   ? Time 6   ? Period Weeks   ? Status On-going   ? Target Date 10/29/21   ? ?  ?   ? ?  ? ? ? ?Plan - 09/17/21 0906   ?  ?  Clinical Impression Statement Georganna presents today reporting a fall on 10/04/21 when she [facility]pped on[facility] at [facility]. She has a small (about 1 cm)  abrasion on her Right latera foot.  Assessment of metarsal and tarsals around abrasion were WNL. She was able to do AROM in all planes without complaints of pain. Very tender at site of abrasion. We worked in supine today to address her back as she was reluctant to do WB exercises. She reports noncompliane with HEP since last visit. Good effort today with therex. Back pain still reported at 6/10. She will benefit from continued PT to address unmet goals.   ?  Personal Factors and Comorbidities Comorbidity 3+   ?  Comorbidities LBP, CVA affectiing left side, anxiety/depression   ?  Examination-Activity Limitations Locomotion Level   ?  Stability/Clinical Decision Making Stable/Uncomplicated   ?  Clinical Decision Making Moderate   ?  Rehab Potential Good   ?  PT Frequency 2x / week   ?  PT Duration 6 weeks   ?  PT Treatment/Interventions ADLs/Self Care Home Management;Aquatic Therapy;Electrical Stimulation;Moist Heat;Cryotherapy;Traction;Neuromuscular re-education;Balance training;Therapeutic exercise;Therapeutic activities;Gait training;Patient/family education;Manual techniques;Dry needling   ?  PT Next Visit Plan Assess goals;  work on balance/gait and functional strength, review HEP   ?  PT Home Exercise Plan [phone] (from previous episode)   ?  Consulted and Agree with Plan of Care Patient   ?  ?   ?  ?  ?   ?  ?  ?Patient will benefit from skilled therapeutic intervention in order to improve the following deficits and impairments:  Abnormal gait, Decreased range of motion, Pain, Increased muscle spasms, Decreased activity tolerance, Decreased strength, Decreased balance ? ? ?Waddell Iten, PT ?10/09/2021, 9:43 AM ? ?  ? ?

## 2021-10-09 ENCOUNTER — Encounter: Payer: Self-pay | Admitting: Physical Therapy

## 2021-10-09 ENCOUNTER — Other Ambulatory Visit: Payer: Self-pay

## 2021-10-09 ENCOUNTER — Ambulatory Visit: Payer: Medicare Other | Admitting: Physical Therapy

## 2021-10-09 DIAGNOSIS — M5441 Lumbago with sciatica, right side: Secondary | ICD-10-CM

## 2021-10-09 DIAGNOSIS — M5416 Radiculopathy, lumbar region: Secondary | ICD-10-CM

## 2021-10-09 DIAGNOSIS — R2681 Unsteadiness on feet: Secondary | ICD-10-CM | POA: Diagnosis not present

## 2021-10-09 DIAGNOSIS — R252 Cramp and spasm: Secondary | ICD-10-CM

## 2021-10-09 DIAGNOSIS — M6281 Muscle weakness (generalized): Secondary | ICD-10-CM

## 2021-10-09 DIAGNOSIS — R262 Difficulty in walking, not elsewhere classified: Secondary | ICD-10-CM | POA: Diagnosis not present

## 2021-10-09 DIAGNOSIS — R2689 Other abnormalities of gait and mobility: Secondary | ICD-10-CM | POA: Diagnosis not present

## 2021-10-09 DIAGNOSIS — M5442 Lumbago with sciatica, left side: Secondary | ICD-10-CM | POA: Diagnosis not present

## 2021-10-10 ENCOUNTER — Encounter: Payer: Self-pay | Admitting: Physician Assistant

## 2021-10-10 ENCOUNTER — Ambulatory Visit
Admission: RE | Admit: 2021-10-10 | Discharge: 2021-10-10 | Disposition: A | Discharge status: Home / Self Care | Payer: Medicare Other | Source: Ambulatory Visit | Attending: Physician Assistant | Admitting: Physician Assistant

## 2021-10-10 ENCOUNTER — Ambulatory Visit: Payer: Medicare Other | Attending: Physician Assistant | Admitting: Physician Assistant

## 2021-10-10 ENCOUNTER — Other Ambulatory Visit: Payer: Self-pay | Admitting: Physician Assistant

## 2021-10-10 VITALS — BP 126/78 | HR 90 | Temp 98.2°F | Resp 18 | Ht 66.0 in | Wt 210.0 lb

## 2021-10-10 DIAGNOSIS — E559 Vitamin D deficiency, unspecified: Secondary | ICD-10-CM

## 2021-10-10 DIAGNOSIS — R7989 Other specified abnormal findings of blood chemistry: Secondary | ICD-10-CM

## 2021-10-10 DIAGNOSIS — E785 Hyperlipidemia, unspecified: Secondary | ICD-10-CM | POA: Diagnosis not present

## 2021-10-10 DIAGNOSIS — R7303 Prediabetes: Secondary | ICD-10-CM | POA: Diagnosis not present

## 2021-10-10 DIAGNOSIS — F32A Depression, unspecified: Secondary | ICD-10-CM | POA: Diagnosis not present

## 2021-10-10 DIAGNOSIS — I10 Essential (primary) hypertension: Secondary | ICD-10-CM | POA: Diagnosis not present

## 2021-10-10 DIAGNOSIS — R9389 Abnormal findings on diagnostic imaging of other specified body structures: Secondary | ICD-10-CM

## 2021-10-10 DIAGNOSIS — Z1211 Encounter for screening for malignant neoplasm of colon: Secondary | ICD-10-CM | POA: Diagnosis not present

## 2021-10-10 DIAGNOSIS — K2100 Gastro-esophageal reflux disease with esophagitis, without bleeding: Secondary | ICD-10-CM | POA: Diagnosis not present

## 2021-10-10 DIAGNOSIS — S9031XA Contusion of right foot, initial encounter: Secondary | ICD-10-CM

## 2021-10-10 DIAGNOSIS — Z862 Personal history of diseases of the blood and blood-forming organs and certain disorders involving the immune mechanism: Secondary | ICD-10-CM

## 2021-10-10 DIAGNOSIS — Z8673 Personal history of transient ischemic attack (TIA), and cerebral infarction without residual deficits: Secondary | ICD-10-CM | POA: Diagnosis not present

## 2021-10-10 DIAGNOSIS — Z1331 Encounter for screening for depression: Secondary | ICD-10-CM

## 2021-10-10 DIAGNOSIS — M79671 Pain in right foot: Secondary | ICD-10-CM | POA: Diagnosis not present

## 2021-10-10 LAB — GLUCOSE, POCT (MANUAL RESULT ENTRY): POC Glucose: 197 mg/dl — AB (ref 70–99)

## 2021-10-10 LAB — POCT GLYCOSYLATED HEMOGLOBIN (HGB A1C): Hemoglobin A1C: 6.3 % — AB (ref 4.0–5.6)

## 2021-10-10 MED ORDER — METOPROLOL TARTRATE 25 MG PO TABS: 25.0000 mg | ORAL_TABLET | Freq: Two times a day (BID) | ORAL | 1 refills | Status: DC

## 2021-10-10 MED ORDER — LISINOPRIL 10 MG PO TABS: 10.0000 mg | ORAL_TABLET | Freq: Every day | ORAL | 1 refills | Status: DC

## 2021-10-10 MED ORDER — ATORVASTATIN CALCIUM 80 MG PO TABS: ORAL_TABLET | ORAL | 1 refills | Status: DC

## 2021-10-10 MED ORDER — SPIRONOLACTONE 25 MG PO TABS: 25.0000 mg | ORAL_TABLET | Freq: Every day | ORAL | 1 refills | Status: DC

## 2021-10-10 MED ORDER — HYDROCHLOROTHIAZIDE 12.5 MG PO CAPS: 12.5000 mg | ORAL_CAPSULE | Freq: Every day | ORAL | 1 refills | Status: DC

## 2021-10-10 MED ORDER — CLOPIDOGREL BISULFATE 75 MG PO TABS: 75.0000 mg | ORAL_TABLET | Freq: Every day | ORAL | 1 refills | Status: DC

## 2021-10-10 MED ORDER — TRAZODONE HCL 100 MG PO TABS: 100.0000 mg | ORAL_TABLET | Freq: Every day | ORAL | 1 refills | Status: DC

## 2021-10-10 MED ORDER — AMLODIPINE BESYLATE 10 MG PO TABS: ORAL_TABLET | ORAL | 0 refills | Status: DC

## 2021-10-10 MED ORDER — POST-OP SHOE/SOFT TOP WOMEN MISC: 1.0000 | Freq: Every day | 0 refills | Status: DC | PRN

## 2021-10-10 MED ORDER — HYDRALAZINE HCL 50 MG PO TABS: 50.0000 mg | ORAL_TABLET | Freq: Three times a day (TID) | ORAL | 0 refills | Status: DC

## 2021-10-10 MED ORDER — PANTOPRAZOLE SODIUM 20 MG PO TBEC: 20.0000 mg | DELAYED_RELEASE_TABLET | Freq: Every day | ORAL | 1 refills | Status: DC

## 2021-10-10 NOTE — Progress Notes (Signed)
Patient has had a [employer] drink this morning. ?Patient has not taken medication today. ?Patient reports right foot concern after a fall last Friday. ? ?

## 2021-10-10 NOTE — Progress Notes (Signed)
Patient ID: Brandy Roberts, female   DOB: July 07, 1970, 51 y.o.   MRN: [id number] ? ? ?Brandy Roberts, is a 51 y.o. female ? ?[id number] ? ?[id number] ? ?DOB - Jun 18, 1970 ? ?No chief complaint on file. ?    ? ?Subjective:  ? ?Brandy Roberts is a 51 y.o. female here today for several issues.  Needs labs drawn per [facility] request and faxed there once obtained.   ? ?[place] and her R foot has been hurting since.  R foot pain.  Some pain with weight bearing and a small area with a scab.  No LOC ? ?Positive depression screening with passive SI-she is on meds, has psych and counselor.  She says she has been this way "her whole life."  She had considered suicide about a year ago but her sister called the police.  Now she says she has passive thoughts of "not wanting to be around," but no plan or intent.  Protective factors-family, religion, friends, hope for the future.   ? ?Needs med RF ? ?No problems updated. ? ?ALLERGIES: ?Allergies  ?Allergen Reactions  ? Ibuprofen Other (See Comments)  ?  Can not take with current medications ?.  ? Tylenol [Acetaminophen] Other (See Comments)  ?  Pt stated tylenol gives her extreme headache  ? ? ?PAST MEDICAL HISTORY: ?Past Medical History:  ?Diagnosis Date  ? Anemia   ? Anxiety   ? Aortic regurgitation   ? Cardiomyopathy due to hypertension ([place]) 10/23/2014  ? Chest discomfort 10/12/2018  ? Chronic ischemic vertebrobasilar artery thalamic stroke   ? Depression   ? Essential hypertension 05/02/2015  ? Former smoker 01/24/2015  ? H/O noncompliance with medical treatment, presenting hazards to health   ? Headache 05/01/2015  ? History of ischemic middle cerebral artery stroke embolic   ? Hyperlipidemia   ? Hypertension   ? Hypertensive heart disease   ? Migraine 05/21/2018  ? Migraines   ? Nonrheumatic aortic (valve) insufficiency   ? Osteoarthritis   ? Overweight (BMI 25.0-29.9)   ? Palpitations 10/12/2018  ? Sleep apnea   ? Snoring 05/02/2015  ?  Stroke ([place]) 10/25/2016  ? Syncope and collapse 10/07/2018  ? ? ?MEDICATIONS AT HOME: ?Prior to Admission medications   ?Medication Sig Start Date End Date Taking? Authorizing Provider  ?busPIRone (BUSPAR) 10 MG tablet Take 1 tablet (10 mg total) by mouth 3 (three) times daily. 01/28/21  Yes [name], NP  ?FLUoxetine (PROZAC) 10 MG tablet Take 10 mg by mouth daily.   Yes [provider]  ?flurbiprofen (ANSAID) 100 MG tablet TAKE 1 TABLET BY MOUTH EVERY 8 HOURS AS NEEDED (NOT MORE THAN 3 TABLETS A DAY) 07/04/21  Yes Pieter Partridge, DO  ?Galcanezumab-gnlm 120 MG/ML SOAJ Inject 120 mg into the skin every 30 (thirty) days. 01/15/21  Yes Pieter Partridge, DO  ?HYDROcodone-acetaminophen (NORCO/VICODIN) 5-325 MG tablet Take 1 tablet by mouth every 8 (eight) hours as needed. 08/27/21  Yes Rosemarie Ax, MD  ?hydrOXYzine (ATARAX/VISTARIL) 50 MG tablet Take 50 mg by mouth 3 (three) times daily as needed for anxiety or sleep.   Yes [provider]  ?methocarbamol (ROBAXIN) 500 MG tablet Take 1 tablet (500 mg total) by mouth 2 (two) times daily. 04/30/21  Yes Prosperi, Christian H, PA-C  ?metoCLOPramide (REGLAN) 10 MG tablet Take 10 mg by mouth every 6 (six) hours as needed for nausea or headache.   Yes [provider]  ?Misc.  Devices MISC Incontinent supplies 06/19/21  Yes [name], NP  ?mupirocin ointment (BACTROBAN) 2 % Apply 1 application topically 2 (two) times daily. 05/21/20  Yes [name], NP  ?venlafaxine XR (EFFEXOR-XR) 75 MG 24 hr capsule TAKE 3 CAPSULES BY MOUTH EVERY MORNING WITH BREAKFAST 08/21/21  Yes Pieter Partridge, DO  ?Vitamin D, Ergocalciferol, (DRISDOL) 1.25 MG (50000 UNIT) CAPS capsule Take 1 capsule (50,000 Units total) by mouth every 7 (seven) days. Take for 8 total doses(weeks) 05/17/21  Yes Rosemarie Ax, MD  ?amLODipine (NORVASC) 10 MG tablet TAKE 1 TABLET BY MOUTH EVERY DAY FOR BLOOD PRESSURE 10/10/21   Argentina Donovan, PA-C  ?atorvastatin (LIPITOR) 80 MG  tablet TAKE 1 TABLET BY MOUTH EVERY DAY AT 6PM FOR CHOLESTEROL 10/10/21   Argentina Donovan, PA-C  ?clopidogrel (PLAVIX) 75 MG tablet Take 1 tablet (75 mg total) by mouth daily. 10/10/21   Argentina Donovan, PA-C  ?diazepam (VALIUM) 5 MG tablet Please take 30 minutes prior to procedure. May repeat x 1 ?Patient not taking: Reported on 10/10/2021 08/06/21   Rosemarie Ax, MD  ?Elastic Bandages & Supports (POST-OP SHOE/SOFT TOP WOMEN) MISC 1 each by Does not apply route daily as needed. 10/10/21   Argentina Donovan, PA-C  ?hydrALAZINE (APRESOLINE) 50 MG tablet Take 1 tablet (50 mg total) by mouth 3 (three) times daily. 10/10/21   Argentina Donovan, PA-C  ?hydrochlorothiazide (MICROZIDE) 12.5 MG capsule Take 1 capsule (12.5 mg total) by mouth daily. 10/10/21   Argentina Donovan, PA-C  ?lisinopril (ZESTRIL) 10 MG tablet Take 1 tablet (10 mg total) by mouth daily. 10/10/21   Argentina Donovan, PA-C  ?metoprolol tartrate (LOPRESSOR) 25 MG tablet Take 1 tablet (25 mg total) by mouth 2 (two) times daily. 10/10/21   Argentina Donovan, PA-C  ?nitroGLYCERIN (NITROSTAT) 0.4 MG SL tablet Place 0.4 mg under the tongue every 5 (five) minutes as needed for chest pain. ?Patient not taking: Reported on 10/10/2021    [provider]  ?pantoprazole (PROTONIX) 20 MG tablet Take 1 tablet (20 mg total) by mouth daily. 10/10/21   Argentina Donovan, PA-C  ?spironolactone (ALDACTONE) 25 MG tablet Take 1 tablet (25 mg total) by mouth daily. 10/10/21   Argentina Donovan, PA-C  ?traZODone (DESYREL) 100 MG tablet Take 1 tablet (100 mg total) by mouth at bedtime. 10/10/21   Argentina Donovan, PA-C  ? ? ?ROS: ?Neg HEENT ?Neg resp ?Neg cardiac ?Neg GI ?Neg psych ?Neg neuro ? ?Objective:  ? ?Vitals:  ? 10/10/21 0913  ?BP: 126/78  ?Pulse: 90  ?Resp: 18  ?Temp: 98.2 ?F (36.8 ?C)  ?TempSrc: Oral  ?SpO2: 97%  ?Weight: 210 lb (95.3 kg)  ?Height: '5\' 6"'$  (1.676 m)  ? ?Exam ?General appearance : Awake, alert, not in any distress. Speech Clear. Not toxic  looking ?HEENT: Atraumatic and Normocephalic ?Neck: Supple, no JVD. No cervical lymphadenopathy.  ?Chest: Good air entry bilaterally, CTAB.  No rales/rhonchi/wheezing ?CVS: S1 S2 regular, no murmurs.  ?R vs L foot.  No visible swelling of ankle or foot.  There is a small ~1cm scab over the mid 5th metatarsal.  Full ROM ankle.  TTP R 4th/5th metatarsal but much less with distraction.  N-V intact ?Extremities: B/L Lower Ext shows no edema, both legs are warm to touch ?Neurology: Awake alert, and oriented X 3, CN II-XII intact, Non focal ?Skin: No Rash ? ?Data Review ?Lab Results  ?Component Value Date  ? HGBA1C  6.3 (A) 10/10/2021  ? HGBA1C 6.8 06/19/2021  ? HGBA1C 6.3 (H) 12/17/2020  ? ? ?Assessment & Plan  ? ?1. Prediabetes ?stable ?- Glucose (CBG) ?- HgB A1c ? ?2. Gastroesophageal reflux disease with esophagitis without hemorrhage ?- pantoprazole (PROTONIX) 20 MG tablet; Take 1 tablet (20 mg total) by mouth daily.  Dispense: 90 tablet; Refill: 1 ?- Comprehensive metabolic panel ? ?3. Dyslipidemia ?- atorvastatin (LIPITOR) 80 MG tablet; TAKE 1 TABLET BY MOUTH EVERY DAY AT 6PM FOR CHOLESTEROL  Dispense: 90 tablet; Refill: 1 ?- Lipid panel ? ?4. Abnormal TSH ?- Thyroid Panel With TSH ? ?5. History of anemia ?- CBC with Differential/Platelet ? ?6. Essential hypertension ?controlled ?- amLODipine (NORVASC) 10 MG tablet; TAKE 1 TABLET BY MOUTH EVERY DAY FOR BLOOD PRESSURE  Dispense: 90 tablet; Refill: 0 ?- hydrALAZINE (APRESOLINE) 50 MG tablet; Take 1 tablet (50 mg total) by mouth 3 (three) times daily.  Dispense: 270 tablet; Refill: 0 ?- hydrochlorothiazide (MICROZIDE) 12.5 MG capsule; Take 1 capsule (12.5 mg total) by mouth daily.  Dispense: 90 capsule; Refill: 1 ?- lisinopril (ZESTRIL) 10 MG tablet; Take 1 tablet (10 mg total) by mouth daily.  Dispense: 90 tablet; Refill: 1 ?- metoprolol tartrate (LOPRESSOR) 25 MG tablet; Take 1 tablet (25 mg total) by mouth 2 (two) times daily.  Dispense: 180 tablet; Refill: 1 ?-  spironolactone (ALDACTONE) 25 MG tablet; Take 1 tablet (25 mg total) by mouth daily.  Dispense: 90 tablet; Refill: 1 ? ?7. Contusion of right foot, initial encounter ?- DG Foot Complete Right; Future ?Post op s

## 2021-10-10 NOTE — Progress Notes (Unsigned)
Patient ID: Brandy Roberts, female   DOB: 1970-11-12, 51 y.o.   MRN: [id number] ? ?

## 2021-10-11 LAB — CBC WITH DIFFERENTIAL/PLATELET
Basophils Absolute: 0.1 10*3/uL (ref 0.0–0.2)
Basos: 1 %
EOS (ABSOLUTE): 0.6 10*3/uL — ABNORMAL HIGH (ref 0.0–0.4)
Eos: 9 %
Hematocrit: 41.1 % (ref 34.0–46.6)
Hemoglobin: 13.4 g/dL (ref 11.1–15.9)
Immature Grans (Abs): 0 10*3/uL (ref 0.0–0.1)
Immature Granulocytes: 0 %
Lymphocytes Absolute: 2.3 10*3/uL (ref 0.7–3.1)
Lymphs: 33 %
MCH: 26.9 pg (ref 26.6–33.0)
MCHC: 32.6 g/dL (ref 31.5–35.7)
MCV: 82 fL (ref 79–97)
Monocytes Absolute: 0.5 10*3/uL (ref 0.1–0.9)
Monocytes: 7 %
Neutrophils Absolute: 3.4 10*3/uL (ref 1.4–7.0)
Neutrophils: 50 %
Platelets: 236 10*3/uL (ref 150–450)
RBC: 4.99 x10E6/uL (ref 3.77–5.28)
RDW: 14 % (ref 11.7–15.4)
WBC: 6.8 10*3/uL (ref 3.4–10.8)

## 2021-10-11 LAB — COMPREHENSIVE METABOLIC PANEL
ALT: 34 IU/L — ABNORMAL HIGH (ref 0–32)
AST: 27 IU/L (ref 0–40)
Albumin/Globulin Ratio: 1.4 (ref 1.2–2.2)
Albumin: 4.3 g/dL (ref 3.8–4.9)
Alkaline Phosphatase: 140 IU/L — ABNORMAL HIGH (ref 44–121)
BUN/Creatinine Ratio: 16 (ref 9–23)
BUN: 18 mg/dL (ref 6–24)
Bilirubin Total: 0.2 mg/dL (ref 0.0–1.2)
CO2: 24 mmol/L (ref 20–29)
Calcium: 9.6 mg/dL (ref 8.7–10.2)
Chloride: 102 mmol/L (ref 96–106)
Creatinine, Ser: 1.14 mg/dL — ABNORMAL HIGH (ref 0.57–1.00)
Globulin, Total: 3.1 g/dL (ref 1.5–4.5)
Glucose: 163 mg/dL — ABNORMAL HIGH (ref 70–99)
Potassium: 4 mmol/L (ref 3.5–5.2)
Sodium: 141 mmol/L (ref 134–144)
Total Protein: 7.4 g/dL (ref 6.0–8.5)
eGFR: 58 mL/min/{1.73_m2} — ABNORMAL LOW (ref >59)

## 2021-10-11 LAB — LIPID PANEL
Chol/HDL Ratio: 3.3 ratio (ref 0.0–4.4)
Cholesterol, Total: 137 mg/dL (ref 100–199)
HDL: 42 mg/dL (ref >39)
LDL Chol Calc (NIH): 78 mg/dL (ref 0–99)
Triglycerides: 88 mg/dL (ref 0–149)
VLDL Cholesterol Cal: 17 mg/dL (ref 5–40)

## 2021-10-11 LAB — THYROID PANEL WITH TSH
Free Thyroxine Index: 2.5 (ref 1.2–4.9)
T3 Uptake Ratio: 31 % (ref 24–39)
T4, Total: 8.2 ug/dL (ref 4.5–12.0)
TSH: 1.31 u[IU]/mL (ref 0.450–4.500)

## 2021-10-12 LAB — FECAL OCCULT BLOOD, IMMUNOCHEMICAL: Fecal Occult Bld: NEGATIVE

## 2021-10-14 NOTE — Therapy (Addendum)
?OUTPATIENT PHYSICAL THERAPY TREATMENT NOTE ? ? ?Patient Name: Brandy Roberts ?MRN: [id number] ?DOB:January 04, 1971, 51 y.o., female ?Today's Date: 10/15/2021 ? ?PCP: Brandy Pounds, NP ?REFERRING PROVIDER: Rosemarie Ax, MD ? ? PT End of Session - 10/15/21 0850   ? ? Visit Number 4   ? Number of Visits 12   ? Date for PT Re-Evaluation 10/29/21   ? Authorization Type [employer] MCR   ? PT Start Time [phone]   ? PT Stop Time 0930   ? PT Time Calculation (min) 44 min   ? Activity Tolerance Patient tolerated treatment well   ? Behavior During Therapy [facility] for tasks assessed/performed   ? ?  ?  ? ?  ? ? ? ?Past Medical History:  ?Diagnosis Date  ? Anemia   ? Anxiety   ? Aortic regurgitation   ? Cardiomyopathy due to hypertension ([place]) 10/23/2014  ? Chest discomfort 10/12/2018  ? Chronic ischemic vertebrobasilar artery thalamic stroke   ? Depression   ? Essential hypertension 05/02/2015  ? Former smoker 01/24/2015  ? H/O noncompliance with medical treatment, presenting hazards to health   ? Headache 05/01/2015  ? History of ischemic middle cerebral artery stroke embolic   ? Hyperlipidemia   ? Hypertension   ? Hypertensive heart disease   ? Migraine 05/21/2018  ? Migraines   ? Nonrheumatic aortic (valve) insufficiency   ? Osteoarthritis   ? Overweight (BMI 25.0-29.9)   ? Palpitations 10/12/2018  ? Sleep apnea   ? Snoring 05/02/2015  ? Stroke ([place]) 10/25/2016  ? Syncope and collapse 10/07/2018  ? ?Past Surgical History:  ?Procedure Laterality Date  ? CESAREAN SECTION    ? GANGLION CYST EXCISION    ? TEE WITHOUT CARDIOVERSION N/A 10/09/2014  ? Procedure: TRANSESOPHAGEAL ECHOCARDIOGRAM (TEE);  Surgeon: Brandy Klein, MD;  Location: [facility] ENDOSCOPY;  Service: Cardiovascular;  Laterality: N/A;  ? TONSILLECTOMY    ? ?Patient Active Problem List  ? Diagnosis Date Noted  ? Vitamin D deficiency 05/15/2021  ? Facet arthropathy 05/15/2021  ? SI joint arthritis 05/15/2021  ? Lumbar radiculopathy 05/01/2021  ? Obesity (BMI 35.0-39.9 without comorbidity)  11/26/2020  ? Mixed dyslipidemia 11/26/2020  ? Anemia   ? Anxiety   ? Depression   ? Hypertension   ? Migraines   ? Nonrheumatic aortic (valve) insufficiency   ? Osteoarthritis   ? Sleep apnea   ? Palpitations 10/12/2018  ? Chest discomfort 10/12/2018  ? Syncope and collapse 10/07/2018  ? Migraine 05/21/2018  ? Stroke ([place]) 10/25/2016  ? Snoring 05/02/2015  ? Essential hypertension 05/02/2015  ? Headache 05/01/2015  ? Former smoker 01/24/2015  ? Cardiomyopathy due to hypertension ([place]) 10/23/2014  ? Chronic ischemic vertebrobasilar artery thalamic stroke   ? Hypertensive heart disease   ? Overweight (BMI 25.0-29.9)   ? H/O noncompliance with medical treatment, presenting hazards to health   ? Aortic regurgitation   ? History of ischemic middle cerebral artery stroke embolic   ? Hyperlipidemia   ? ? ?REFERRING DIAG: lumbar radiculopathy  ? ?THERAPY DIAG:  ?Radiculopathy, lumbar region ? ?Unsteadiness on feet ? ?Cramp and spasm ? ?Muscle weakness (generalized) ? ?Acute midline low back pain with bilateral sciatica ? ?PERTINENT HISTORY: CVA, MI, OA, anxiety/depression ? ?PRECAUTIONS: Fall ? ?SUBJECTIVE: No new complaints ?  ? ?PAIN:  ? ?Are you having pain? no Yes: NPRS scale: 0/10 ?Pain location: back ?Pain description: ache ?Aggravating factors:  ?Relieving factors:  ? ?OBJECTIVE: TAKEN AT EVALUATION UNLESS OTHERWISE DATED ? ?ROM /  Strength  10/15/21   ?  AROM / PROM / Strength AROM;Strength     ?       ?  AROM    ?  Overall AROM Comments full lumbar flex and right SB; 75% extension and LSB, 50% rotation (may be due to balance deficits     ?       ?  Strength    ?  Overall Strength Comments Right hip flex and knee flex/ext 5/5     ?  Strength Assessment Site Hip;Knee     ?  Right/Left Hip Left     ?  Left Hip Flexion 2+/5     ?  Right/Left Knee Left     ?  Left Knee Flexion 3/5     ?  Left Knee Extension 3/5     ?       ?  Palpation    ?  Palpation comment tender in right gluteals     ?       ?  Special Tests    ?   Other special tests SLS R LE  = < 3 sec  ? SLS R LE = 5 sec    ?       ?  Ambulation/Gait    ?  Ambulation/Gait Yes     ?  Ambulation/Gait Assistance 7: Independent     ?  Ambulation Distance (Feet) 40 Feet     ?  Assistive device Large base quad cane;None     ?  Gait Pattern Step-through pattern;Decreased step length - right;Decreased stance time - left;Wide base of support     ?  Ambulation Surface Level     ?       ?  Balance    ?  Balance Assessed Yes     ?       ?  Standardized Balance Assessment    ?  Standardized Balance Assessment Timed Up and Go Test;Five Times Sit to Stand     ?  Five times sit to stand comments  12.11 sec:   10.56 sec   ?       ?  Timed Up and Go Test    ?  TUG Normal TUG     ?  Normal TUG (seconds) 20 sec ? 9.19 sec without quad cane   ?  TUG Comments with quad cane at eval    ?       ?  Functional Gait  Assessment    ?  Gait assessed  Yes     ?  Total Score 15  21   ?  FGA comment: goal 48 for age  goal 10 for age    ?  ?   ? ? ? ? ?TODAY'S TREATMENT:  ? ? 10/15/21 ? Nustep L5 x 6 min ? Sit to stand x 5 ? SLS multiple reps two finger touch to no touch; 5 sec hold on R ? Machines: knee ext 5# x 10; flex 15# x 10 ? ? 6 min walk test: 2 min 40 sec without AD walked  ? FGA = 21 (see flowsheets) ? ? ?10/09/21 ?Manual: assessment of foot ?Seated R ankle mobility: PF/DF,inv/ever, circles cw/ccw x 10 ea, D2 flex x 10 ? ?Bridging x 10 cues for higher raise ?Supine clam GTB x 10; unilateral x 10 bil; marching GTB x 10 ea  ?SDLY hip ABD GTB R x 10 ?Prone: left quad sets x 15, then 5  x 5 sec hold, R 10 x 5 sec hold; unable to do SLR in prone bil; HS curl R 2x5 RTB ?SDLY hip extension 2x5 with PT helping to stabilize hips ? ? ?PATIENT EDUCATION: ?Education details: Encourage  ?Person educated: Patient ?Education method: Explanation and Demonstration ?Education comprehension: verbalized understanding and returned demonstration ? ? ?[place]: ?[id number] ? ? ?ASSESSMENT: ? ?  ?  Clinical  Impression Statement Brandy Roberts presents today reporting no pain. Goals were reassessed and she is progressing well. Her TUG decreased by 10 seconds and she was able to complete without AD. 5x sit to stand has improved significantly as well to 10.19 sec. SLS on R has improved by 2 sec. She is still limited with walking endurance due to fatigue as evidenced by her 6MWT. She had to sit to rest at 2:40 after walking 468 ft but was not limited by back pain. She reports that her new pain meds have helped significantly with pain. Her FGA has improved to 21 with greatest difficulty with walking bwd and walking with eyes closed. She will continue to benefit from skilled PT to meet unmet goals.   ?  Personal Factors and Comorbidities Comorbidity 3+   ?  Comorbidities LBP, CVA affectiing left side, anxiety/depression   ?  Examination-Activity Limitations Locomotion Level   ?  Stability/Clinical Decision Making Stable/Uncomplicated   ?  Clinical Decision Making Moderate   ?  Rehab Potential Good  ?  ? ? ? ? ? ? PT Short Term Goals - Target Date 10/01/21  ? ?  ? PT SHORT TERM GOAL #1  ? Title Patient to be independent with initial HEP.   ? Baseline previous HEP needs to be reviewed   ? Time 2   ? Period Weeks   ? Status On-going   updated HEP to alternate sitting and standing exercises.  ? Target Date 10/01/21   ?  ? PT SHORT TERM GOAL #2  ? Title Improved sit to stand to < 10 sec   ? Baseline 10.19 sec on 10/15/21  ? Time 4   ? Period Weeks   ? Status On-going   ? ?  ? ?  ? ? ? PT Long Term Goals - Target Date 10/29/21   ? ?  ? PT LONG TERM GOAL #1  ? Title Patient to be independent with advanced HEP.   ? Baseline has previous HEP from last episode   ? Time 6   ? Period Weeks   ? Status On-going   ? Target Date 10/29/21   ?  ? PT LONG TERM GOAL #2  ? Title Patient to demonstrate improved FGA to 25   ? Baseline FGA = 15   ? Time 6   ? Period Weeks   ? Status On-going   ? Target Date 10/29/21   ?  ? PT LONG TERM GOAL #3  ? Title Patient  will report 50% improvement in LBP/radicular symptoms.   ? Baseline 3/10 pain at eval   ? Time 6   ? Period Weeks   ? Status Achieved  ? Target Date 10/29/21   ?  ? PT LONG TERM GOAL #4  ? Title Patient w

## 2021-10-15 ENCOUNTER — Other Ambulatory Visit: Payer: Self-pay

## 2021-10-15 ENCOUNTER — Encounter: Payer: Self-pay | Admitting: Physical Therapy

## 2021-10-15 ENCOUNTER — Ambulatory Visit: Payer: Medicare Other | Admitting: Physical Therapy

## 2021-10-15 DIAGNOSIS — M5416 Radiculopathy, lumbar region: Secondary | ICD-10-CM

## 2021-10-15 DIAGNOSIS — R2681 Unsteadiness on feet: Secondary | ICD-10-CM

## 2021-10-15 DIAGNOSIS — M6281 Muscle weakness (generalized): Secondary | ICD-10-CM

## 2021-10-15 DIAGNOSIS — M5441 Lumbago with sciatica, right side: Secondary | ICD-10-CM | POA: Diagnosis not present

## 2021-10-15 DIAGNOSIS — R262 Difficulty in walking, not elsewhere classified: Secondary | ICD-10-CM | POA: Diagnosis not present

## 2021-10-15 DIAGNOSIS — R252 Cramp and spasm: Secondary | ICD-10-CM

## 2021-10-15 DIAGNOSIS — M5442 Lumbago with sciatica, left side: Secondary | ICD-10-CM | POA: Diagnosis not present

## 2021-10-15 DIAGNOSIS — R2689 Other abnormalities of gait and mobility: Secondary | ICD-10-CM | POA: Diagnosis not present

## 2021-10-15 NOTE — Progress Notes (Deleted)
NEUROLOGY FOLLOW UP OFFICE NOTE  Adaley Kiene [id number]  Assessment/Plan:   1.  Migraine without aura, without status migrainosus, not intractable - today with a persistent headache for a week. 2. Syncope.  May be due to orthostatic hypotension, vasovagal or cardiac source.  The first event occurred in setting of severe migraine, which may have been vasovagal.  MRI of brain and EEG unremarkable.     1. Migraine prophylaxis:  Emgality, venlafaxine XR '225mg'$  daily 3.  Migraine rescue:  Flurbiprofen '100mg'$  4.  Limit use of pain relievers to no more than 2 days out of week to prevent risk of rebound or medication-overuse headache. 5.  Keep headache diary 6.  Follow up in 9 months.     Subjective:  Dayanne Yiu is a 51 year old right-handed woman with aortic insufficiency, hypertension, hyperlipidemia and history of stroke who follows up for migraines.   UPDATE: Intensity: Moderate Duration: 2 to 3 hours Frequency: 2 days a month   She has had a migraine for the past week and hasn't been able to pick up her medications because she no longer had refills.  Current NSAIDS: Flurbiprofen '100mg'$  Current analgesics: None Current triptans: None Current ergotamine: None Current anti-emetic:  Reglan 10 mg Current muscle relaxants: None Current anti-anxiolytic: None Current sleep aide: trazodone Current Antihypertensive medications: Amlodipine 10 mg, hydralazine, Spironolactone, lisinopril, Lopressor Current Antidepressant medications: Venlafaxine XR 225 mg, fluoxetine '10mg'$  Current Anticonvulsant medications:Lyrica Current anti-CGRP: Emgality Current Vitamins/Herbal/Supplements: None Current Antihistamines/Decongestants: None Other therapy: None Hormone/birth control: None     Caffeine: Coffee, sweet tea, not daily Alcohol: No Smoker: Cigarettes Diet: Hydrates Exercise: No Depression: Yes; Anxiety: Yes Other pain: Chronic right knee pain Sleep hygiene: Sometimes tosses  and turns     HISTORY: MIGRAINES Onset: Migraines since she was young, but worse since her stroke in May 2016. Location:  holocephalic Quality:  pounding Initial Intensity:  10/10 Aura:  no Prodrome:  no Postdrome:  no Associated symptoms: Photophobia, phonophobia.  Rarely nausea.  She has not had any new worse headache of her life, waking up from sleep Initial Duration:  2 days (but Fioricet and ibuprofen lowers intensity down to 2-3/10) Initial Frequency:  Once or twice a month Frequency of abortive medication: only as needed Triggers: None Relieving factors:  Laying down.  Butalbital, ibuprofen.  Tramadol helped but she was told not to take it. Activity:  aggravates   Past NSAIDS:  Ibuprofen, naproxen Past analgesics:  Tylenol, Excedrin, Tramadol (effective but GI upset), Fioricet Past abortive triptans:  no Past muscle relaxants:  no Past anti-emetic:  Zofran Past antihypertensive medications:  no Past antidepressant medications:  no Past anticonvulsant medications: topiramate, gabapentin Past CGRP inhibitor:  None Past vitamins/Herbal/Supplements:  no Other past therapies:  no   Family history of headache:  Sister.   STROKE:  She had a stroke in May 2016 with left sided weakness.  MRI of brain from 10/06/14 was personally reviewed and revealed small acute lacunar infarcts in the right thalamus and right corona radiata, as well as chronic lacunar infarcts in the left hemisphere.  CTA of head and neck revealed diffuse bilateral petrous and cavernous carotid stenosis.  Cardiac source of embolus was not discovered.     She was admitted to [facility] from 10/24/16 to 10/26/16 for stroke-like event.  She suddenly developed left sided weakness and slurred speech with associated headache.  Blood pressure in ED was 120/66.  CT of head was personally reviewed and negative for acute  abnormality.  She had refused tPA.  MRI of brain was personally reviewed and revealed no acute  stroke or bleed.  MRA of head revealed no significant intracranial stenosis or occlusion.  LDL was 84.  Hgb A1c was 5.7.  As per neurology evaluation, her deficits appeared to be non-organic, with unusual speech pattern and giveaway weakness.  Somatization was suspected.  2D echo from 11/06/16 demonstrated normal LV EF of 65-70% with no cardiac source of emboli.  Atorvastatin was increased from '10mg'$  to '80mg'$  daily.  Differential includes TIA vs hemiplegic migraine vs somatization.   SYNCOPE: On 09/21/18, she presented to the ED at [facility] for a 5 day intractable holocephalic throbbing headache.  She reports that when EMS was leading her to the ambulance from her bedroom, she passed out for 20-30 seconds.  She described sensation of lightheadedness, like she was going to pass out, as well as tunnel vision and palpitations.  She was afebrile.  CT of head revealed her old left basal ganglia infarct but no acute abnormality.  She was treated with Reglan and Benadryl and headache reportedly resolved.  On 09/26/18, she was sitting on her barstool when she felt lightheaded again.  She noted palpitations as well.  She got up and started walking to her bedroom when she noted tunnel vision and passed out.  She woke up on the floor and bruised her back and leg.  She thinks she may have been unconsciousness for 20 to 30 minute.  There were no witnesses.  She noted urinary incontinence but did not bite her tongue.  She did not have a headache.  She denies change in medications over the past week.  She denies fever or illness.  Due to episodes of recurrent syncopal events, she underwent workup.  MRI of brain with and without contrast on 10/11/18 was personally reviewed and demonstrated stable chronic infarcts but no acute intracranial abnormality.  EEG performed on 10/21/18 was normal. No recurrent spells.   FALLS: In 2019, she endorsed increased problems with balance and falls.  She stated that they were associated with  headache and worsening of her left-sided weakness.  MRI of the brain without contrast from 11/13/2017 was personally reviewed and demonstrated no new intracranial abnormalities.  PAST MEDICAL HISTORY: Past Medical History:  Diagnosis Date   Anemia    Anxiety    Aortic regurgitation    Cardiomyopathy due to hypertension ([place]) 10/23/2014   Chest discomfort 10/12/2018   Chronic ischemic vertebrobasilar artery thalamic stroke    Depression    Essential hypertension 05/02/2015   Former smoker 01/24/2015   H/O noncompliance with medical treatment, presenting hazards to health    Headache 05/01/2015   History of ischemic middle cerebral artery stroke embolic    Hyperlipidemia    Hypertension    Hypertensive heart disease    Migraine 05/21/2018   Migraines    Nonrheumatic aortic (valve) insufficiency    Osteoarthritis    Overweight (BMI 25.0-29.9)    Palpitations 10/12/2018   Sleep apnea    Snoring 05/02/2015   Stroke ([place]) 10/25/2016   Syncope and collapse 10/07/2018    MEDICATIONS: Current Outpatient Medications on File Prior to Visit  Medication Sig Dispense Refill   amLODipine (NORVASC) 10 MG tablet TAKE 1 TABLET BY MOUTH EVERY DAY FOR BLOOD PRESSURE 90 tablet 0   atorvastatin (LIPITOR) 80 MG tablet TAKE 1 TABLET BY MOUTH EVERY DAY AT 6PM FOR CHOLESTEROL 90 tablet 1   busPIRone (BUSPAR) 10 MG tablet Take  1 tablet (10 mg total) by mouth 3 (three) times daily. 90 tablet 3   clopidogrel (PLAVIX) 75 MG tablet Take 1 tablet (75 mg total) by mouth daily. 90 tablet 1   diazepam (VALIUM) 5 MG tablet Please take 30 minutes prior to procedure. May repeat x 1 (Patient not taking: Reported on 10/10/2021) 2 tablet 0   Elastic Bandages & Supports (POST-OP SHOE/SOFT TOP WOMEN) MISC 1 each by Does not apply route daily as needed. 1 each 0   FLUoxetine (PROZAC) 10 MG tablet Take 10 mg by mouth daily.     flurbiprofen (ANSAID) 100 MG tablet TAKE 1 TABLET BY MOUTH EVERY 8 HOURS AS NEEDED (NOT MORE THAN 3  TABLETS A DAY) 30 tablet 5   Galcanezumab-gnlm 120 MG/ML SOAJ Inject 120 mg into the skin every 30 (thirty) days. 1.12 mL 0   hydrALAZINE (APRESOLINE) 50 MG tablet Take 1 tablet (50 mg total) by mouth 3 (three) times daily. 270 tablet 0   hydrochlorothiazide (MICROZIDE) 12.5 MG capsule Take 1 capsule (12.5 mg total) by mouth daily. 90 capsule 1   HYDROcodone-acetaminophen (NORCO/VICODIN) 5-325 MG tablet Take 1 tablet by mouth every 8 (eight) hours as needed. 15 tablet 0   hydrOXYzine (ATARAX/VISTARIL) 50 MG tablet Take 50 mg by mouth 3 (three) times daily as needed for anxiety or sleep.     lisinopril (ZESTRIL) 10 MG tablet Take 1 tablet (10 mg total) by mouth daily. 90 tablet 1   methocarbamol (ROBAXIN) 500 MG tablet Take 1 tablet (500 mg total) by mouth 2 (two) times daily. 20 tablet 0   metoCLOPramide (REGLAN) 10 MG tablet Take 10 mg by mouth every 6 (six) hours as needed for nausea or headache.     metoprolol tartrate (LOPRESSOR) 25 MG tablet Take 1 tablet (25 mg total) by mouth 2 (two) times daily. 180 tablet 1   Misc. Devices MISC Incontinent supplies 1 each 0   mupirocin ointment (BACTROBAN) 2 % Apply 1 application topically 2 (two) times daily. 100 g 3   nitroGLYCERIN (NITROSTAT) 0.4 MG SL tablet Place 0.4 mg under the tongue every 5 (five) minutes as needed for chest pain. (Patient not taking: Reported on 10/10/2021)     pantoprazole (PROTONIX) 20 MG tablet Take 1 tablet (20 mg total) by mouth daily. 90 tablet 1   spironolactone (ALDACTONE) 25 MG tablet Take 1 tablet (25 mg total) by mouth daily. 90 tablet 1   traZODone (DESYREL) 100 MG tablet Take 1 tablet (100 mg total) by mouth at bedtime. 90 tablet 1   venlafaxine XR (EFFEXOR-XR) 75 MG 24 hr capsule TAKE 3 CAPSULES BY MOUTH EVERY MORNING WITH BREAKFAST 90 capsule 1   Vitamin D, Ergocalciferol, (DRISDOL) 1.25 MG (50000 UNIT) CAPS capsule Take 1 capsule (50,000 Units total) by mouth every 7 (seven) days. Take for 8 total doses(weeks) 8  capsule 0   No current facility-administered medications on file prior to visit.    ALLERGIES: Allergies  Allergen Reactions   Ibuprofen Other (See Comments)    Can not take with current medications .   Tylenol [Acetaminophen] Other (See Comments)    Pt stated tylenol gives her extreme headache    FAMILY HISTORY: Family History  Problem Relation Age of Onset   Cancer Mother        type unknown   Hypertension Mother    Hypertension Sister    Diabetes Sister    Cancer Maternal Aunt        4 aunts died  of cancer types unknown      Objective:  *** General: No acute distress.  Patient appears well-groomed.   Head:  Normocephalic/atraumatic Eyes:  Fundi examined but not visualized Neck: supple, no paraspinal tenderness, full range of motion Heart:  Regular rate and rhythm Neurological Exam: alert and oriented to person, place, and time.  Speech fluent and not dysarthric, language intact.  CN II-XII intact. Bulk and tone normal, muscle strength 5/5 throughout.  Sensation to light touch intact.  Deep tendon reflexes 2+ throughout, toes downgoing.  Finger to nose testing intact.  Gait normal, Romberg negative.   Metta Clines, DO  CC: [name], NP

## 2021-10-16 ENCOUNTER — Encounter: Payer: Self-pay | Admitting: Neurology

## 2021-10-16 ENCOUNTER — Ambulatory Visit: Payer: Medicare Other | Admitting: Neurology

## 2021-10-16 DIAGNOSIS — Z029 Encounter for administrative examinations, unspecified: Secondary | ICD-10-CM

## 2021-10-16 NOTE — Therapy (Signed)
OUTPATIENT PHYSICAL THERAPY TREATMENT NOTE   Patient Name: Brandy Roberts MRN: [id number] DOB:Dec 24, 1970, 51 y.o., female Today's Date: 10/17/2021  PCP: [name], NP REFERRING PROVIDER: Rosemarie Ax, MD   PT End of Session - 10/17/21 0847     Visit Number 5    Number of Visits 12    Date for PT Re-Evaluation 10/29/21    Authorization Type [employer] MCR    PT Start Time 2671    PT Stop Time 0928    PT Time Calculation (min) 42 min    Activity Tolerance Patient tolerated treatment well    Behavior During Therapy [facility] for tasks assessed/performed               Past Medical History:  Diagnosis Date   Anemia    Anxiety    Aortic regurgitation    Cardiomyopathy due to hypertension ([place]) 10/23/2014   Chest discomfort 10/12/2018   Chronic ischemic vertebrobasilar artery thalamic stroke    Depression    Essential hypertension 05/02/2015   Former smoker 01/24/2015   H/O noncompliance with medical treatment, presenting hazards to health    Headache 05/01/2015   History of ischemic middle cerebral artery stroke embolic    Hyperlipidemia    Hypertension    Hypertensive heart disease    Migraine 05/21/2018   Migraines    Nonrheumatic aortic (valve) insufficiency    Osteoarthritis    Overweight (BMI 25.0-29.9)    Palpitations 10/12/2018   Sleep apnea    Snoring 05/02/2015   Stroke ([place]) 10/25/2016   Syncope and collapse 10/07/2018   Past Surgical History:  Procedure Laterality Date   CESAREAN SECTION     GANGLION CYST EXCISION     TEE WITHOUT CARDIOVERSION N/A 10/09/2014   Procedure: TRANSESOPHAGEAL ECHOCARDIOGRAM (TEE);  Surgeon: Sanda Klein, MD;  Location: [facility] ENDOSCOPY;  Service: Cardiovascular;  Laterality: N/A;   TONSILLECTOMY     Patient Active Problem List   Diagnosis Date Noted   Vitamin D deficiency 05/15/2021   Facet arthropathy 05/15/2021   SI joint arthritis 05/15/2021   Lumbar radiculopathy 05/01/2021   Obesity (BMI 35.0-39.9 without  comorbidity) 11/26/2020   Mixed dyslipidemia 11/26/2020   Anemia    Anxiety    Depression    Hypertension    Migraines    Nonrheumatic aortic (valve) insufficiency    Osteoarthritis    Sleep apnea    Palpitations 10/12/2018   Chest discomfort 10/12/2018   Syncope and collapse 10/07/2018   Migraine 05/21/2018   Stroke ([place]) 10/25/2016   Snoring 05/02/2015   Essential hypertension 05/02/2015   Headache 05/01/2015   Former smoker 01/24/2015   Cardiomyopathy due to hypertension ([place]) 10/23/2014   Chronic ischemic vertebrobasilar artery thalamic stroke    Hypertensive heart disease    Overweight (BMI 25.0-29.9)    H/O noncompliance with medical treatment, presenting hazards to health    Aortic regurgitation    History of ischemic middle cerebral artery stroke embolic    Hyperlipidemia     REFERRING DIAG: lumbar radiculopathy   THERAPY DIAG:  Unsteadiness on feet  Radiculopathy, lumbar region  Cramp and spasm  Muscle weakness (generalized)  PERTINENT HISTORY: CVA, MI, OA, anxiety/depression  PRECAUTIONS: Fall  SUBJECTIVE: You worked me the other day. I was tired.    PAIN:    Are you having pain? no Yes: NPRS scale: 0/10 Pain location: back Pain description: ache Aggravating factors:  Relieving factors:   OBJECTIVE: (TAKEN AT EVALUATION UNLESS OTHERWISE DATED)  ROM / Strength  10/15/21 10/17/21    AROM / PROM / Strength AROM;Strength              AROM      Overall AROM Comments full lumbar flex and right SB; 75% extension and LSB, 50% rotation (may be due to balance deficits              Strength      Overall Strength Comments Right hip flex and knee flex/ext 5/5       Strength Assessment Site Hip;Knee       Right/Left Hip Left       Left Hip Flexion 2+/5       Right/Left Knee Left       Left Knee Flexion 3/5       Left Knee Extension 3/5              Palpation      Palpation comment tender in right gluteals              Special Tests      Other  special tests SLS R LE  = < 3 sec   SLS R LE = 5 sec  SLS R = 10 sec; L = 8 sec           Ambulation/Gait      Ambulation/Gait Yes       Ambulation/Gait Assistance 7: Independent       Ambulation Distance (Feet) 40 Feet       Assistive device Large base quad cane;None       Gait Pattern Step-through pattern;Decreased step length - right;Decreased stance time - left;Wide base of support       Ambulation Surface Level              Balance      Balance Assessed Yes              Standardized Balance Assessment      Standardized Balance Assessment Timed Up and Go Test;Five Times Sit to Stand       Five times sit to stand comments  12.11 sec:   10.56 sec            Timed Up and Go Test      TUG Normal TUG       Normal TUG (seconds) 20 sec  9.19 sec without quad cane     TUG Comments with quad cane at eval             Functional Gait  Assessment      Gait assessed  Yes       Total Score 15  21     FGA comment: goal 38 for age  goal 27 for age             89 TREATMENT:    10/17/21  Gait: 2 min 10 sec walked 400 ft then needed to sit down; 2 min 45 sec walked 540 ft needed to sit; no AD Sit to stand x 5 with 4#, 2x5 with 8# Seated marching 2 x 10 bil SLS multiple reps bil: R 10 sec and L 8 sec Bwd walking x 10 ft (no problem) Tandem walking 4 x 10 ft Braiding 4 x 10 ft with intermittent UE support Leg press 25# x 10, 15# x 10 Row 25# x 10 horizontal and 2x10 vert Machines: knee ext 5# 2x 5; flex 15# x 10    10/15/21  Nustep L5 x 6 min  Sit to stand x 5  SLS multiple reps two finger touch to no touch; 5 sec hold on R  Machines: knee ext 5# x 10; flex 15# x 10   6 min walk test: 2 min 40 sec without AD walked 468 ft  FGA = 21 (see flowsheets)   10/09/21 Manual: assessment of foot Seated R ankle mobility: PF/DF,inv/ever, circles cw/ccw x 10 ea, D2 flex x 10  Bridging x 10 cues for higher raise Supine clam GTB x 10; unilateral x 10 bil; marching GTB x 10 ea   SDLY hip ABD GTB R x 10 Prone: left quad sets x 15, then 5 x 5 sec hold, R 10 x 5 sec hold; unable to do SLR in prone bil; HS curl R 2x5 RTB SDLY hip extension 2x5 with PT helping to stabilize hips   PATIENT EDUCATION: Education details: Encourage  Person educated: Patient Education method: Customer service manager Education comprehension: verbalized understanding and returned demonstration   HOME EXERCISE PROGRAM: [id number]   ASSESSMENT:      Clinical Impression Statement Edison presents today reporting no pain. She tolerated increased exercise today with minimal rest. Braiding is challenging to stay within 12 inch path. SLS continues to improve bil. PT continues to encourage walking program at home.     Personal Factors and Comorbidities Comorbidity 3+     Comorbidities LBP, CVA affectiing left side, anxiety/depression     Examination-Activity Limitations Locomotion Level     Stability/Clinical Decision Making Stable/Uncomplicated     Clinical Decision Making Moderate     Rehab Potential Good          PT Short Term Goals - Target Date 10/01/21      PT SHORT TERM GOAL #1   Title Patient to be independent with initial HEP.    Baseline previous HEP needs to be reviewed    Time 2    Period Weeks    Status On-going   updated HEP to alternate sitting and standing exercises.   Target Date 10/01/21      PT SHORT TERM GOAL #2   Title Improved sit to stand to < 10 sec    Baseline 10.19 sec on 10/15/21   Time 4    Period Weeks    Status On-going            PT Long Term Goals - Target Date 10/29/21       PT LONG TERM GOAL #1   Title Patient to be independent with advanced HEP.    Baseline has previous HEP from last episode    Time 6    Period Weeks    Status On-going    Target Date 10/29/21      PT LONG TERM GOAL #2   Title Patient to demonstrate improved FGA to 25    Baseline FGA = 15    Time 6    Period Weeks    Status On-going    Target Date 10/29/21       PT LONG TERM GOAL #3   Title Patient will report 50% improvement in LBP/radicular symptoms.    Baseline 3/10 pain at eval    Time 6    Period Weeks    Status Achieved   Target Date 10/29/21      PT LONG TERM GOAL #4   Title Patient will tolerated standing/walking for 5 minutes without severe LBP/bil LE to perform ADLs    Baseline Limited by fatigue vs pain today (10/15/21)  Time 6    Period Weeks    Status ongoing    Target Date 10/29/21      PT LONG TERM GOAL #5   Title Patient will demonstrate improved physical functioning by improving FOTO score to 52.    Baseline FOTO = 46    Time 6    Period Weeks    Status On-going    Target Date 10/29/21              PLAN:       PT Frequency 2x / week     PT Duration 6 weeks     PT Treatment/Interventions ADLs/Self Care Home Management;Aquatic Therapy;Electrical Stimulation;Moist Heat;Cryotherapy;Traction;Neuromuscular re-education;Balance training;Therapeutic exercise;Therapeutic activities;Gait training;Patient/family education;Manual techniques;Dry needling     PT Next Visit Plan Continue to work on balance/gait and functional strength, review [place] [phone] (from previous episode)     Consulted and Agree with Plan of Care Patient              Patient will benefit from skilled therapeutic intervention in order to improve the following deficits and impairments:  Abnormal gait, Decreased range of motion, Pain, Increased muscle spasms, Decreased activity tolerance, Decreased strength, Decreased balance   Zymarion Favorite, PT 10/17/2021, 10:22 AM

## 2021-10-17 ENCOUNTER — Ambulatory Visit: Payer: Medicare Other | Admitting: Physical Therapy

## 2021-10-17 ENCOUNTER — Encounter: Payer: Self-pay | Admitting: Physical Therapy

## 2021-10-17 DIAGNOSIS — M5416 Radiculopathy, lumbar region: Secondary | ICD-10-CM

## 2021-10-17 DIAGNOSIS — M6281 Muscle weakness (generalized): Secondary | ICD-10-CM | POA: Diagnosis not present

## 2021-10-17 DIAGNOSIS — R2681 Unsteadiness on feet: Secondary | ICD-10-CM

## 2021-10-17 DIAGNOSIS — R252 Cramp and spasm: Secondary | ICD-10-CM | POA: Diagnosis not present

## 2021-10-17 DIAGNOSIS — M5442 Lumbago with sciatica, left side: Secondary | ICD-10-CM | POA: Diagnosis not present

## 2021-10-17 DIAGNOSIS — R262 Difficulty in walking, not elsewhere classified: Secondary | ICD-10-CM | POA: Diagnosis not present

## 2021-10-17 DIAGNOSIS — R2689 Other abnormalities of gait and mobility: Secondary | ICD-10-CM | POA: Diagnosis not present

## 2021-10-17 DIAGNOSIS — M5441 Lumbago with sciatica, right side: Secondary | ICD-10-CM | POA: Diagnosis not present

## 2021-10-18 ENCOUNTER — Ambulatory Visit: Payer: Medicare Other | Admitting: Family

## 2021-10-21 ENCOUNTER — Ambulatory Visit: Payer: Medicare Other

## 2021-10-21 DIAGNOSIS — R252 Cramp and spasm: Secondary | ICD-10-CM | POA: Diagnosis not present

## 2021-10-21 DIAGNOSIS — M5442 Lumbago with sciatica, left side: Secondary | ICD-10-CM | POA: Diagnosis not present

## 2021-10-21 DIAGNOSIS — R262 Difficulty in walking, not elsewhere classified: Secondary | ICD-10-CM | POA: Diagnosis not present

## 2021-10-21 DIAGNOSIS — M5416 Radiculopathy, lumbar region: Secondary | ICD-10-CM | POA: Diagnosis not present

## 2021-10-21 DIAGNOSIS — R2681 Unsteadiness on feet: Secondary | ICD-10-CM

## 2021-10-21 DIAGNOSIS — M6281 Muscle weakness (generalized): Secondary | ICD-10-CM

## 2021-10-21 DIAGNOSIS — R2689 Other abnormalities of gait and mobility: Secondary | ICD-10-CM

## 2021-10-21 DIAGNOSIS — M5441 Lumbago with sciatica, right side: Secondary | ICD-10-CM

## 2021-10-21 NOTE — Therapy (Addendum)
OUTPATIENT PHYSICAL THERAPY TREATMENT NOTE  Rationale for Evaluation and Treatment Rehabilitation   Patient Name: Brandy Roberts MRN: [id number] DOB:02-13-1971, 51 y.o., female Today's Date: 10/21/2021  PCP: [name], NP REFERRING PROVIDER: Rosemarie Ax, MD   PT End of Session - 10/21/21 [phone]     Visit Number 6    Number of Visits 12    Date for PT Re-Evaluation 10/29/21    Authorization Type UHC MCR    PT Start Time 0845    PT Stop Time 0926    PT Time Calculation (min) 41 min    Activity Tolerance Patient tolerated treatment well    Behavior During Therapy [facility] for tasks assessed/performed                Past Medical History:  Diagnosis Date   Anemia    Anxiety    Aortic regurgitation    Cardiomyopathy due to hypertension ([place]) 10/23/2014   Chest discomfort 10/12/2018   Chronic ischemic vertebrobasilar artery thalamic stroke    Depression    Essential hypertension 05/02/2015   Former smoker 01/24/2015   H/O noncompliance with medical treatment, presenting hazards to health    Headache 05/01/2015   History of ischemic middle cerebral artery stroke embolic    Hyperlipidemia    Hypertension    Hypertensive heart disease    Migraine 05/21/2018   Migraines    Nonrheumatic aortic (valve) insufficiency    Osteoarthritis    Overweight (BMI 25.0-29.9)    Palpitations 10/12/2018   Sleep apnea    Snoring 05/02/2015   Stroke ([place]) 10/25/2016   Syncope and collapse 10/07/2018   Past Surgical History:  Procedure Laterality Date   CESAREAN SECTION     GANGLION CYST EXCISION     TEE WITHOUT CARDIOVERSION N/A 10/09/2014   Procedure: TRANSESOPHAGEAL ECHOCARDIOGRAM (TEE);  Surgeon: Sanda Klein, MD;  Location: [facility] ENDOSCOPY;  Service: Cardiovascular;  Laterality: N/A;   TONSILLECTOMY     Patient Active Problem List   Diagnosis Date Noted   Vitamin D deficiency 05/15/2021   Facet arthropathy 05/15/2021   SI joint arthritis 05/15/2021   Lumbar  radiculopathy 05/01/2021   Obesity (BMI 35.0-39.9 without comorbidity) 11/26/2020   Mixed dyslipidemia 11/26/2020   Anemia    Anxiety    Depression    Hypertension    Migraines    Nonrheumatic aortic (valve) insufficiency    Osteoarthritis    Sleep apnea    Palpitations 10/12/2018   Chest discomfort 10/12/2018   Syncope and collapse 10/07/2018   Migraine 05/21/2018   Stroke ([place]) 10/25/2016   Snoring 05/02/2015   Essential hypertension 05/02/2015   Headache 05/01/2015   Former smoker 01/24/2015   Cardiomyopathy due to hypertension ([place]) 10/23/2014   Chronic ischemic vertebrobasilar artery thalamic stroke    Hypertensive heart disease    Overweight (BMI 25.0-29.9)    H/O noncompliance with medical treatment, presenting hazards to health    Aortic regurgitation    History of ischemic middle cerebral artery stroke embolic    Hyperlipidemia     REFERRING DIAG: lumbar radiculopathy   THERAPY DIAG:  Radiculopathy, lumbar region  Unsteadiness on feet  Cramp and spasm  Muscle weakness (generalized)  Acute midline low back pain with bilateral sciatica  Difficulty in walking, not elsewhere classified  Other abnormalities of gait and mobility  PERTINENT HISTORY: CVA, MI, OA, anxiety/depression  PRECAUTIONS: Fall  SUBJECTIVE: Pt reports her legs are tired today, does not want to do Nu Step warm up.  PAIN:    Are you having pain? no Yes: NPRS scale: 0/10 Pain location: back Pain description: ache Aggravating factors:  Relieving factors:   OBJECTIVE: (TAKEN AT EVALUATION UNLESS OTHERWISE DATED)  ROM / Strength  10/15/21 10/17/21    AROM / PROM / Strength AROM;Strength              AROM      Overall AROM Comments full lumbar flex and right SB; 75% extension and LSB, 50% rotation (may be due to balance deficits              Strength      Overall Strength Comments Right hip flex and knee flex/ext 5/5       Strength Assessment Site Hip;Knee       Right/Left Hip  Left       Left Hip Flexion 2+/5       Right/Left Knee Left       Left Knee Flexion 3/5       Left Knee Extension 3/5              Palpation      Palpation comment tender in right gluteals              Special Tests      Other special tests SLS R LE  = < 3 sec   SLS R LE = 5 sec  SLS R = 10 sec; L = 8 sec           Ambulation/Gait      Ambulation/Gait Yes       Ambulation/Gait Assistance 7: Independent       Ambulation Distance (Feet) 40 Feet       Assistive device Large base quad cane;None       Gait Pattern Step-through pattern;Decreased step length - right;Decreased stance time - left;Wide base of support       Ambulation Surface Level              Balance      Balance Assessed Yes              Standardized Balance Assessment      Standardized Balance Assessment Timed Up and Go Test;Five Times Sit to Stand       Five times sit to stand comments  12.11 sec:   10.56 sec            Timed Up and Go Test      TUG Normal TUG       Normal TUG (seconds) 20 sec  9.19 sec without quad cane     TUG Comments with quad cane at eval             Functional Gait  Assessment      Gait assessed  Yes       Total Score 15  21     FGA comment: goal 33 for age  goal 49 for age             TODAY'S TREATMENT:   10/21/21:  Gait Training:   2x down and back (170 ft), rest break required, then another 2x down and back   Therapeutic Exercise:  Cybex knee flexion 15# x 10 reps  Cybex knee extension 5# x 10  Cybex row 25# 2x10  Leg press 15# 2x10  Seated clamshell with GTB 2x10  Seated marches with GTB 2x10   Functional test:  10x STS - no UE support done in  14 sec   10/17/21  Gait: 2 min 10 sec walked 400 ft then needed to sit down; 2 min 45 sec walked 540 ft needed to sit; no AD Sit to stand x 5 with 4#, 2x5 with 8# Seated marching 2 x 10 bil SLS multiple reps bil: R 10 sec and L 8 sec Bwd walking x 10 ft (no problem) Tandem walking 4 x 10 ft Braiding 4 x 10 ft with  intermittent UE support Leg press 25# x 10, 15# x 10 Row 25# x 10 horizontal and 2x10 vert Machines: knee ext 5# 2x 5; flex 15# x 10    10/15/21  Nustep L5 x 6 min  Sit to stand x 5  SLS multiple reps two finger touch to no touch; 5 sec hold on R  Machines: knee ext 5# x 10; flex 15# x 10   6 min walk test: 2 min 40 sec without AD walked 468 ft  FGA = 21 (see flowsheets)      PATIENT EDUCATION: Education details: Encourage  Person educated: Patient Education method: Customer service manager Education comprehension: verbalized understanding and returned demonstration   HOME EXERCISE PROGRAM: [id number]   ASSESSMENT:      Clinical Impression Statement Pt reports non-compliance with HEP. She able to complete 10 STS in 14 sec but she also was more fatigued this morning. She showed a good response to TE today only limited by general fatigue and low muscle endurance. Cues for proper foot placement on leg press and for eccentric control throughout session. STGs are still not met.    Personal Factors and Comorbidities Comorbidity 3+     Comorbidities LBP, CVA affectiing left side, anxiety/depression     Examination-Activity Limitations Locomotion Level     Stability/Clinical Decision Making Stable/Uncomplicated     Clinical Decision Making Moderate     Rehab Potential Good          PT Short Term Goals - Target Date 10/01/21      PT SHORT TERM GOAL #1   Title Patient to be independent with initial HEP.    Baseline previous HEP needs to be reviewed    Time 2    Period Weeks    Status On-going   Pt reports non-compliance with HEP   Target Date 10/01/21      PT SHORT TERM GOAL #2   Title Improved sit to stand to < 10 sec    Baseline 10.19 sec on 10/15/21   Time 4    Period Weeks    Status On-going (14 sec on 10/21/21)           PT Long Term Goals - Target Date 10/29/21       PT LONG TERM GOAL #1   Title Patient to be independent with advanced HEP.     Baseline has previous HEP from last episode    Time 6    Period Weeks    Status On-going    Target Date 10/29/21      PT LONG TERM GOAL #2   Title Patient to demonstrate improved FGA to 25    Baseline FGA = 15    Time 6    Period Weeks    Status On-going    Target Date 10/29/21      PT LONG TERM GOAL #3   Title Patient will report 50% improvement in LBP/radicular symptoms.    Baseline 3/10 pain at eval    Time 6    Period  Weeks    Status Achieved   Target Date 10/29/21      PT LONG TERM GOAL #4   Title Patient will tolerated standing/walking for 5 minutes without severe LBP/bil LE to perform ADLs    Baseline Limited by fatigue vs pain today (10/15/21)   Time 6    Period Weeks    Status ongoing    Target Date 10/29/21      PT LONG TERM GOAL #5   Title Patient will demonstrate improved physical functioning by improving FOTO score to 52.    Baseline FOTO = 46    Time 6    Period Weeks    Status On-going    Target Date 10/29/21              PLAN:       PT Frequency 2x / week     PT Duration 6 weeks     PT Treatment/Interventions ADLs/Self Care Home Management;Aquatic Therapy;Electrical Stimulation;Moist Heat;Cryotherapy;Traction;Neuromuscular re-education;Balance training;Therapeutic exercise;Therapeutic activities;Gait training;Patient/family education;Manual techniques;Dry needling     PT Next Visit Plan Continue to work on balance/gait and functional strength, review [place] [phone] (from previous episode)     Consulted and Agree with Plan of Care Patient              Patient will benefit from skilled therapeutic intervention in order to improve the following deficits and impairments:  Abnormal gait, Decreased range of motion, Pain, Increased muscle spasms, Decreased activity tolerance, Decreased strength, Decreased balance   Artist Pais, PTA 10/21/2021, 9:48 AM

## 2021-10-22 ENCOUNTER — Telehealth: Payer: Self-pay | Admitting: *Deleted

## 2021-10-22 NOTE — Telephone Encounter (Signed)
Patient verified DOB Patient is aware of results and to follow up with PCP for recheck of alkaline level.

## 2021-10-22 NOTE — Telephone Encounter (Signed)
-----   Message from Argentina Donovan, [place] sent at 10/11/2021  7:41 AM EDT ----- Please fax labs to [facility] and  call patient.  Kidney function is slightly impaired.  Drink 80-100 ounces water daily and eat less sugar and starchy foods to prevent prediabetes from progressing to diabetes.  Liver function, electrolytes, thyroid, cholesterol are normal.  Alkaline phosphatase is slightly elevated but not specific and we will follow this/recheck in the future.  At this time, it is not significantly elevated.  Follow up with your PCP as planned.  Thanks, Freeman Caldron, PA-C

## 2021-10-23 NOTE — Therapy (Signed)
OUTPATIENT PHYSICAL THERAPY TREATMENT NOTE  Rationale for Evaluation and Treatment Rehabilitation   Patient Name: Brandy Roberts MRN: [id number] DOB:August 24, 1970, 51 y.o., female Today's Date: 10/24/2021  PCP: [name], NP REFERRING PROVIDER: Rosemarie Ax, MD   PT End of Session - 10/24/21 0840     Visit Number 7    Number of Visits 12    Date for PT Re-Evaluation 10/29/21    Authorization Type UHC MCR    PT Start Time 0845    PT Stop Time 0928    PT Time Calculation (min) 43 min    Activity Tolerance Patient tolerated treatment well    Behavior During Therapy [facility] for tasks assessed/performed                 Past Medical History:  Diagnosis Date   Anemia    Anxiety    Aortic regurgitation    Cardiomyopathy due to hypertension ([place]) 10/23/2014   Chest discomfort 10/12/2018   Chronic ischemic vertebrobasilar artery thalamic stroke    Depression    Essential hypertension 05/02/2015   Former smoker 01/24/2015   H/O noncompliance with medical treatment, presenting hazards to health    Headache 05/01/2015   History of ischemic middle cerebral artery stroke embolic    Hyperlipidemia    Hypertension    Hypertensive heart disease    Migraine 05/21/2018   Migraines    Nonrheumatic aortic (valve) insufficiency    Osteoarthritis    Overweight (BMI 25.0-29.9)    Palpitations 10/12/2018   Sleep apnea    Snoring 05/02/2015   Stroke ([place]) 10/25/2016   Syncope and collapse 10/07/2018   Past Surgical History:  Procedure Laterality Date   CESAREAN SECTION     GANGLION CYST EXCISION     TEE WITHOUT CARDIOVERSION N/A 10/09/2014   Procedure: TRANSESOPHAGEAL ECHOCARDIOGRAM (TEE);  Surgeon: Sanda Klein, MD;  Location: [facility] ENDOSCOPY;  Service: Cardiovascular;  Laterality: N/A;   TONSILLECTOMY     Patient Active Problem List   Diagnosis Date Noted   Vitamin D deficiency 05/15/2021   Facet arthropathy 05/15/2021   SI joint arthritis 05/15/2021   Lumbar  radiculopathy 05/01/2021   Obesity (BMI 35.0-39.9 without comorbidity) 11/26/2020   Mixed dyslipidemia 11/26/2020   Anemia    Anxiety    Depression    Hypertension    Migraines    Nonrheumatic aortic (valve) insufficiency    Osteoarthritis    Sleep apnea    Palpitations 10/12/2018   Chest discomfort 10/12/2018   Syncope and collapse 10/07/2018   Migraine 05/21/2018   Stroke ([place]) 10/25/2016   Snoring 05/02/2015   Essential hypertension 05/02/2015   Headache 05/01/2015   Former smoker 01/24/2015   Cardiomyopathy due to hypertension ([place]) 10/23/2014   Chronic ischemic vertebrobasilar artery thalamic stroke    Hypertensive heart disease    Overweight (BMI 25.0-29.9)    H/O noncompliance with medical treatment, presenting hazards to health    Aortic regurgitation    History of ischemic middle cerebral artery stroke embolic    Hyperlipidemia     REFERRING DIAG: lumbar radiculopathy   THERAPY DIAG:  Unsteadiness on feet  Radiculopathy, lumbar region  Cramp and spasm  Muscle weakness (generalized)  Acute midline low back pain with bilateral sciatica  Difficulty in walking, not elsewhere classified  Other abnormalities of gait and mobility  PERTINENT HISTORY: CVA, MI, OA, anxiety/depression  PRECAUTIONS: Fall  SUBJECTIVE: Patient stated she was ready to work.    PAIN:    Are you having  pain? NO Yes: NPRS scale: 0/10 Pain location: back Pain description: ache Aggravating factors:  Relieving factors:   OBJECTIVE: (TAKEN AT EVALUATION UNLESS OTHERWISE DATED)  ROM / Strength  10/15/21 10/17/21    AROM / PROM / Strength AROM;Strength              AROM      Overall AROM Comments full lumbar flex and right SB; 75% extension and LSB, 50% rotation (may be due to balance deficits              Strength      Overall Strength Comments Right hip flex and knee flex/ext 5/5       Strength Assessment Site Hip;Knee       Right/Left Hip Left       Left Hip Flexion 2+/5        Right/Left Knee Left       Left Knee Flexion 3/5       Left Knee Extension 3/5              Palpation      Palpation comment tender in right gluteals              Special Tests      Other special tests SLS R LE  = < 3 sec   SLS R LE = 5 sec  SLS R = 10 sec; L = 8 sec           Ambulation/Gait      Ambulation/Gait Yes       Ambulation/Gait Assistance 7: Independent       Ambulation Distance (Feet) 40 Feet       Assistive device Large base quad cane;None       Gait Pattern Step-through pattern;Decreased step length - right;Decreased stance time - left;Wide base of support       Ambulation Surface Level              Balance      Balance Assessed Yes              Standardized Balance Assessment      Standardized Balance Assessment Timed Up and Go Test;Five Times Sit to Stand       Five times sit to stand comments  12.11 sec:   10.56 sec            Timed Up and Go Test      TUG Normal TUG       Normal TUG (seconds) 20 sec  9.19 sec without quad cane     TUG Comments with quad cane at eval             Functional Gait  Assessment      Gait assessed  Yes       Total Score 15  21     FGA comment: goal 79 for age  goal 57 for age             TODAY'S TREATMENT:  10/23/21 Gait Training:   2x 170 ft  x 2 rounds rest break required,after each round  Therapeutic Exercise: Sit to stand 8# 1 x 10, 1 x 5   Cybex knee flexion 15# x 10 reps  Cybex knee extension 5# x 10, 10# 2 x 5  Cybex row 25# 2x10  Cybex chest press 10# 2x5  Leg press 15# 2x10  Seated clamshell with GTB 2x10  Seated marches with GTB 2x10  Single leg march L  with YTB 2x 10  Neuro- Reed:  Braiding 4 x 10 ft with intermittent UE support (limited by spasms on last rep) SLS multiple reps bil; limited to 4 sec today bil. Used intermittent UE support and finger touch for support    10/21/21:  Gait Training:   2x down and back (170 ft), rest break required, then another 2x down and back   Therapeutic  Exercise:  Cybex knee flexion 15# x 10 reps  Cybex knee extension 5# x 10  Cybex row 25# 2x10  Leg press 15# 2x10  Seated clamshell with GTB 2x10  Seated marches with GTB 2x10      Functional test:  10x STS - no UE support done in 14 sec   10/17/21  Gait: 2 min 10 sec walked 400 ft then needed to sit down; 2 min 45 sec walked 540 ft needed to sit; no AD Sit to stand x 5 with 4#, 2x5 with 8# Seated marching 2 x 10 bil SLS multiple reps bil: R 10 sec and L 8 sec Bwd walking x 10 ft (no problem) Tandem walking 4 x 10 ft Braiding 4 x 10 ft with intermittent UE support Leg press 25# x 10, 15# x 10 Row 25# x 10 horizontal and 2x10 vert Machines: knee ext 5# 2x 5; flex 15# x 10     PATIENT EDUCATION: Education details:   HEP Person educated: Patient Education method: Customer service manager Education comprehension: verbalized understanding and returned demonstration   HOME EXERCISE PROGRAM: [id number]   ASSESSMENT:      Clinical Impression Statement Patriciann exhibited more fatigue with TE than last week, but did well with frequent short rest breaks. She continues to experience LOB with braiding and SLS was decreased today compared to last week as well. Patient reports she stays in bed a lot when she is home rather than doing HEP or even walking. PT plans to look for community based activities that would encourage her to get out of the house and which pt stated she would be interested in.    Personal Factors and Comorbidities Comorbidity 3+     Comorbidities LBP, CVA affectiing left side, anxiety/depression     Examination-Activity Limitations Locomotion Level     Stability/Clinical Decision Making Stable/Uncomplicated     Clinical Decision Making Moderate     Rehab Potential Good          PT Short Term Goals - Target Date 10/01/21      PT SHORT TERM GOAL #1   Title Patient to be independent with initial HEP.    Baseline previous HEP needs to be reviewed    Time 2     Period Weeks    Status On-going   Pt reports non-compliance with HEP   Target Date 10/01/21      PT SHORT TERM GOAL #2   Title Improved sit to stand to < 10 sec    Baseline 10.19 sec on 10/15/21   Time 4    Period Weeks    Status On-going (14 sec on 10/21/21)           PT Long Term Goals - Target Date 10/29/21       PT LONG TERM GOAL #1   Title Patient to be independent with advanced HEP.    Baseline has previous HEP from last episode    Time 6    Period Weeks    Status On-going    Target Date 10/29/21  PT LONG TERM GOAL #2   Title Patient to demonstrate improved FGA to 25    Baseline FGA = 15    Time 6    Period Weeks    Status On-going    Target Date 10/29/21      PT LONG TERM GOAL #3   Title Patient will report 50% improvement in LBP/radicular symptoms.    Baseline 3/10 pain at eval    Time 6    Period Weeks    Status Achieved   Target Date 10/29/21      PT LONG TERM GOAL #4   Title Patient will tolerated standing/walking for 5 minutes without severe LBP/bil LE to perform ADLs    Baseline Limited by fatigue vs pain today (10/15/21)   Time 6    Period Weeks    Status ongoing    Target Date 10/29/21      PT LONG TERM GOAL #5   Title Patient will demonstrate improved physical functioning by improving FOTO score to 52.    Baseline FOTO = 46    Time 6    Period Weeks    Status On-going    Target Date 10/29/21              PLAN:       PT Frequency 2x / week     PT Duration 6 weeks     PT Treatment/Interventions ADLs/Self Care Home Management;Aquatic Therapy;Electrical Stimulation;Moist Heat;Cryotherapy;Traction;Neuromuscular re-education;Balance training;Therapeutic exercise;Therapeutic activities;Gait training;Patient/family education;Manual techniques;Dry needling     PT Next Visit Plan Provide community based resources/activities. Continue to work on balance/gait and functional strength, review [place] [phone] (from  previous episode)     Consulted and Agree with Plan of Care Patient              Patient will benefit from skilled therapeutic intervention in order to improve the following deficits and impairments:  Abnormal gait, Decreased range of motion, Pain, Increased muscle spasms, Decreased activity tolerance, Decreased strength, Decreased balance   Reshard Guillet, PT 10/24/2021, 3:32 PM

## 2021-10-24 ENCOUNTER — Other Ambulatory Visit: Payer: Self-pay

## 2021-10-24 ENCOUNTER — Encounter: Payer: Self-pay | Admitting: Physical Therapy

## 2021-10-24 ENCOUNTER — Ambulatory Visit: Payer: Medicare Other | Admitting: Physical Therapy

## 2021-10-24 DIAGNOSIS — M5441 Lumbago with sciatica, right side: Secondary | ICD-10-CM

## 2021-10-24 DIAGNOSIS — M6281 Muscle weakness (generalized): Secondary | ICD-10-CM | POA: Diagnosis not present

## 2021-10-24 DIAGNOSIS — R2681 Unsteadiness on feet: Secondary | ICD-10-CM

## 2021-10-24 DIAGNOSIS — R2689 Other abnormalities of gait and mobility: Secondary | ICD-10-CM

## 2021-10-24 DIAGNOSIS — R262 Difficulty in walking, not elsewhere classified: Secondary | ICD-10-CM | POA: Diagnosis not present

## 2021-10-24 DIAGNOSIS — M5416 Radiculopathy, lumbar region: Secondary | ICD-10-CM

## 2021-10-24 DIAGNOSIS — M5442 Lumbago with sciatica, left side: Secondary | ICD-10-CM | POA: Diagnosis not present

## 2021-10-24 DIAGNOSIS — R252 Cramp and spasm: Secondary | ICD-10-CM | POA: Diagnosis not present

## 2021-10-29 ENCOUNTER — Ambulatory Visit: Payer: Medicare Other | Admitting: Physical Therapy

## 2021-10-29 ENCOUNTER — Encounter: Payer: Self-pay | Admitting: Physical Therapy

## 2021-10-29 DIAGNOSIS — M5416 Radiculopathy, lumbar region: Secondary | ICD-10-CM

## 2021-10-29 DIAGNOSIS — R2681 Unsteadiness on feet: Secondary | ICD-10-CM

## 2021-10-29 DIAGNOSIS — R262 Difficulty in walking, not elsewhere classified: Secondary | ICD-10-CM | POA: Diagnosis not present

## 2021-10-29 DIAGNOSIS — M5442 Lumbago with sciatica, left side: Secondary | ICD-10-CM | POA: Diagnosis not present

## 2021-10-29 DIAGNOSIS — M5441 Lumbago with sciatica, right side: Secondary | ICD-10-CM | POA: Diagnosis not present

## 2021-10-29 DIAGNOSIS — R252 Cramp and spasm: Secondary | ICD-10-CM

## 2021-10-29 DIAGNOSIS — M6281 Muscle weakness (generalized): Secondary | ICD-10-CM | POA: Diagnosis not present

## 2021-10-29 DIAGNOSIS — R2689 Other abnormalities of gait and mobility: Secondary | ICD-10-CM | POA: Diagnosis not present

## 2021-10-29 NOTE — Therapy (Signed)
OUTPATIENT PHYSICAL THERAPY TREATMENT NOTE  Rationale for Evaluation and Treatment Rehabilitation   Patient Name: Brandy Roberts MRN: [id number] DOB:06/07/1970, 51 y.o., female Today's Date: 10/29/2021  PCP: [name], NP REFERRING PROVIDER: Rosemarie Ax, MD   PT End of Session - 10/29/21 0848     Visit Number 8    Number of Visits 12    Date for PT Re-Evaluation 11/26/21    Authorization Type [employer] MCR    PT Start Time 0848    PT Stop Time 0929    PT Time Calculation (min) 41 min    Activity Tolerance Patient tolerated treatment well    Behavior During Therapy [facility] for tasks assessed/performed                 Past Medical History:  Diagnosis Date   Anemia    Anxiety    Aortic regurgitation    Cardiomyopathy due to hypertension ([place]) 10/23/2014   Chest discomfort 10/12/2018   Chronic ischemic vertebrobasilar artery thalamic stroke    Depression    Essential hypertension 05/02/2015   Former smoker 01/24/2015   H/O noncompliance with medical treatment, presenting hazards to health    Headache 05/01/2015   History of ischemic middle cerebral artery stroke embolic    Hyperlipidemia    Hypertension    Hypertensive heart disease    Migraine 05/21/2018   Migraines    Nonrheumatic aortic (valve) insufficiency    Osteoarthritis    Overweight (BMI 25.0-29.9)    Palpitations 10/12/2018   Sleep apnea    Snoring 05/02/2015   Stroke ([place]) 10/25/2016   Syncope and collapse 10/07/2018   Past Surgical History:  Procedure Laterality Date   CESAREAN SECTION     GANGLION CYST EXCISION     TEE WITHOUT CARDIOVERSION N/A 10/09/2014   Procedure: TRANSESOPHAGEAL ECHOCARDIOGRAM (TEE);  Surgeon: Sanda Klein, MD;  Location: [facility] ENDOSCOPY;  Service: Cardiovascular;  Laterality: N/A;   TONSILLECTOMY     Patient Active Problem List   Diagnosis Date Noted   Vitamin D deficiency 05/15/2021   Facet arthropathy 05/15/2021   SI joint arthritis 05/15/2021   Lumbar  radiculopathy 05/01/2021   Obesity (BMI 35.0-39.9 without comorbidity) 11/26/2020   Mixed dyslipidemia 11/26/2020   Anemia    Anxiety    Depression    Hypertension    Migraines    Nonrheumatic aortic (valve) insufficiency    Osteoarthritis    Sleep apnea    Palpitations 10/12/2018   Chest discomfort 10/12/2018   Syncope and collapse 10/07/2018   Migraine 05/21/2018   Stroke ([place]) 10/25/2016   Snoring 05/02/2015   Essential hypertension 05/02/2015   Headache 05/01/2015   Former smoker 01/24/2015   Cardiomyopathy due to hypertension ([place]) 10/23/2014   Chronic ischemic vertebrobasilar artery thalamic stroke    Hypertensive heart disease    Overweight (BMI 25.0-29.9)    H/O noncompliance with medical treatment, presenting hazards to health    Aortic regurgitation    History of ischemic middle cerebral artery stroke embolic    Hyperlipidemia     REFERRING DIAG: lumbar radiculopathy   THERAPY DIAG:  Radiculopathy, lumbar region  Unsteadiness on feet  Cramp and spasm  Muscle weakness (generalized)  PERTINENT HISTORY: CVA, MI, OA, anxiety/depression  PRECAUTIONS: Fall  SUBJECTIVE: Patient would like to continue PT for 2 more weeks to continue strengthening. [name]    PAIN:    Are you having pain? NO Yes: NPRS scale: 0/10 Pain location: back Pain description: ache Aggravating factors:  Relieving  factors:   OBJECTIVE: (TAKEN AT EVALUATION UNLESS OTHERWISE DATED)  ROM / Strength  10/15/21 10/17/21    AROM / PROM / Strength AROM;Strength              AROM      Overall AROM Comments full lumbar flex and right SB; 75% extension and LSB, 50% rotation (may be due to balance deficits              Strength      Overall Strength Comments Right hip flex and knee flex/ext 5/5       Strength Assessment Site Hip;Knee       Right/Left Hip Left       Left Hip Flexion 2+/5       Right/Left Knee Left       Left Knee Flexion 3/5       Left Knee Extension 3/5               Palpation      Palpation comment tender in right gluteals              Special Tests      Other special tests SLS R LE  = < 3 sec   SLS R LE = 5 sec  SLS R = 10 sec; L = 8 sec           Ambulation/Gait      Ambulation/Gait Yes       Ambulation/Gait Assistance 7: Independent       Ambulation Distance (Feet) 40 Feet       Assistive device Large base quad cane;None       Gait Pattern Step-through pattern;Decreased step length - right;Decreased stance time - left;Wide base of support       Ambulation Surface Level              Balance      Balance Assessed Yes              Standardized Balance Assessment      Standardized Balance Assessment Timed Up and Go Test;Five Times Sit to Stand       Five times sit to stand comments  12.11 sec:   10.56 sec            Timed Up and Go Test      TUG Normal TUG       Normal TUG (seconds) 20 sec  9.19 sec without quad cane     TUG Comments with quad cane at eval             Functional Gait  Assessment      Gait assessed  Yes       Total Score 15  21 10/29/21- 24    FGA comment: goal 71 for age  goal 56 for age  Goal 60 for age           62 TREATMENT:  10/29/2021 Gait training - SBA for safety, patient choosing to not use AD- 3 x 300' with seated rest breaks between  FGA  Stairs  Therapeutic Exercise: to improve strength and mobility.  Demo, verbal and tactile cues throughout for technique. Cybex knee flexion 20#  2 x 10 reps Cybex knee extension 20#  2 x 10 reps  10/23/21 Gait Training:   2x 170 ft  x 2 rounds rest break required,after each round  Therapeutic Exercise: Sit to stand 8# 1 x 10, 1 x 5   Cybex knee  flexion 15# x 10 reps  Cybex knee extension 5# x 10, 10# 2 x 5  Cybex row 25# 2x10  Cybex chest press 10# 2x5  Leg press 15# 2x10  Seated clamshell with GTB 2x10  Seated marches with GTB 2x10  Single leg march L with YTB 2x 10  Neuro- Reed:  Braiding 4 x 10 ft with intermittent UE support (limited by spasms on  last rep) SLS multiple reps bil; limited to 4 sec today bil. Used intermittent UE support and finger touch for support  10/21/21:  Gait Training:   2x down and back (170 ft), rest break required, then another 2x down and back   Therapeutic Exercise:  Cybex knee flexion 15# x 10 reps  Cybex knee extension 5# x 10  Cybex row 25# 2x10  Leg press 15# 2x10  Seated clamshell with GTB 2x10  Seated marches with GTB 2x10    Functional test:  10x STS - no UE support done in 14 sec     PATIENT EDUCATION: Education details:   HEP Person educated: Patient Education method: Customer service manager Education comprehension: verbalized understanding and returned demonstration   HOME EXERCISE PROGRAM: [id number]   ASSESSMENT:      Clinical Impression Statement Tyiana is making good progress.  She reports more motivation to complete HEP at home and no back pain.  She improved FGA score to 24 from 15, which is significant and decreases her risk of falls.  She also tolerates increased distances without limitations from leg or back pain, just fatigued.   She has met LTG 3 & 4.  Today provided information on [facility] to improve engagement.  She would like to continue PT as she reports significant improvements overall in strength.  She would benefit from continued skilled therapy 1-2x/week for 4 weeks in order to continue progress and met goals.      Personal Factors and Comorbidities Comorbidity 3+     Comorbidities LBP, CVA affectiing left side, anxiety/depression     Examination-Activity Limitations Locomotion Level     Stability/Clinical Decision Making Stable/Uncomplicated     Clinical Decision Making Moderate     Rehab Potential Good          PT Short Term Goals - Target Date 10/01/21      PT SHORT TERM GOAL #1   Title Patient to be independent with initial HEP.    Baseline previous HEP needs to be reviewed    Time 2    Period Weeks    Status 10/29/21 Acheived    Target Date 10/01/21      PT SHORT TERM GOAL #2   Title Improved sit to stand to < 10 sec    Baseline 10.19 sec on 10/15/21   Time 4    Period Weeks    Status On-going (14 sec on 10/21/21)           PT Long Term Goals - Target Date 10/29/21       PT LONG TERM GOAL #1   Title Patient to be independent with advanced HEP.    Baseline has previous HEP from last episode    Time 6    Period Weeks    Status On-going 10/29/21- reports compliance   Target Date 10/29/21      PT LONG TERM GOAL #2   Title Patient to demonstrate improved FGA to 25    Baseline FGA = 15    Time 6    Period  Weeks    Status On-going  10/29/21- 23 pts   Target Date 10/29/21      PT LONG TERM GOAL #3   Title Patient will report 50% improvement in LBP/radicular symptoms.    Baseline 3/10 pain at eval    Time 6    Period Weeks    Status Achieved   Target Date 10/29/21      PT LONG TERM GOAL #4   Title Patient will tolerated standing/walking for 5 minutes without severe LBP/bil LE to perform ADLs    Baseline Limited by fatigue vs pain today (10/15/21)   Time 6    Period Weeks    Status Achieved    Target Date 10/29/21      PT LONG TERM GOAL #5   Title Patient will demonstrate improved physical functioning by improving FOTO score to 52.    Baseline FOTO = 46    Time 6    Period Weeks    Status On-going 10/29/21- 47%   Target Date 10/29/21              PLAN:       PT Frequency 1-2x / week     PT Duration 4 weeks to 11/26/21    PT Treatment/Interventions ADLs/Self Care Home Management;Aquatic Therapy;Electrical Stimulation;Moist Heat;Cryotherapy;Traction;Neuromuscular re-education;Balance training;Therapeutic exercise;Therapeutic activities;Gait training;Patient/family education;Manual techniques;Dry needling     PT Next Visit Plan Continue to work on balance/gait and functional strength, review [place] [phone] (from previous episode)     Consulted and Agree with Plan of  Care Patient              Patient will benefit from skilled therapeutic intervention in order to improve the following deficits and impairments:  Abnormal gait, Decreased range of motion, Pain, Increased muscle spasms, Decreased activity tolerance, Decreased strength, Decreased balance   Rennie Natter, PT, DPT 10/29/2021, 9:37 AM

## 2021-10-30 ENCOUNTER — Emergency Department (HOSPITAL_BASED_OUTPATIENT_CLINIC_OR_DEPARTMENT_OTHER)
Admission: EM | Admit: 2021-10-30 | Discharge: 2021-10-30 | Disposition: A | Discharge status: Home / Self Care | Payer: Medicare Other | Attending: Emergency Medicine | Admitting: Emergency Medicine

## 2021-10-30 ENCOUNTER — Other Ambulatory Visit: Payer: Self-pay

## 2021-10-30 ENCOUNTER — Encounter (HOSPITAL_BASED_OUTPATIENT_CLINIC_OR_DEPARTMENT_OTHER): Payer: Self-pay

## 2021-10-30 DIAGNOSIS — R6889 Other general symptoms and signs: Secondary | ICD-10-CM | POA: Diagnosis not present

## 2021-10-30 DIAGNOSIS — I10 Essential (primary) hypertension: Secondary | ICD-10-CM | POA: Insufficient documentation

## 2021-10-30 DIAGNOSIS — G43009 Migraine without aura, not intractable, without status migrainosus: Secondary | ICD-10-CM | POA: Diagnosis not present

## 2021-10-30 DIAGNOSIS — R519 Headache, unspecified: Secondary | ICD-10-CM | POA: Diagnosis present

## 2021-10-30 DIAGNOSIS — Z7902 Long term (current) use of antithrombotics/antiplatelets: Secondary | ICD-10-CM | POA: Diagnosis not present

## 2021-10-30 DIAGNOSIS — G4489 Other headache syndrome: Secondary | ICD-10-CM | POA: Diagnosis not present

## 2021-10-30 DIAGNOSIS — Z743 Need for continuous supervision: Secondary | ICD-10-CM | POA: Diagnosis not present

## 2021-10-30 DIAGNOSIS — Z79899 Other long term (current) drug therapy: Secondary | ICD-10-CM | POA: Diagnosis not present

## 2021-10-30 MED ORDER — PROCHLORPERAZINE MALEATE 10 MG PO TABS
10.0000 mg | ORAL_TABLET | Freq: Once | ORAL | Status: AC
Start: 2021-10-30 — End: 2021-10-30
  Administered 2021-10-30: 10 mg via ORAL
  Filled 2021-10-30: qty 1

## 2021-10-30 MED ORDER — KETOROLAC TROMETHAMINE 15 MG/ML IJ SOLN
15.0000 mg | Freq: Once | INTRAMUSCULAR | Status: AC
  Administered 2021-10-30: 15 mg via INTRAMUSCULAR
  Filled 2021-10-30: qty 1

## 2021-10-30 MED ORDER — DEXAMETHASONE SODIUM PHOSPHATE 10 MG/ML IJ SOLN
10.0000 mg | Freq: Once | INTRAMUSCULAR | Status: AC
  Administered 2021-10-30: 10 mg via INTRAMUSCULAR
  Filled 2021-10-30: qty 1

## 2021-10-30 NOTE — ED Notes (Signed)
Pt A&OX4 at d/c, verbalized understanding of d/c instructions and follow up care. Pt wheeled out of the ED via wheelchair.

## 2021-10-30 NOTE — ED Notes (Signed)
Pt had crackers and water

## 2021-10-30 NOTE — ED Triage Notes (Signed)
Pt from home , brought by EMS , headache started today , denies NVD per EMS . Hx migraine . Alert and oriented x 4 , no weakness.  HX stroke 2012

## 2021-10-30 NOTE — ED Triage Notes (Signed)
Pt BIB [facility]. Pt states she was "fussing with my son, " and developed a bad HA. Denies N/V or other associated symptoms.

## 2021-10-30 NOTE — ED Provider Notes (Signed)
[facility] EMERGENCY DEPARTMENT Provider Note   CSN: [id number] Arrival date & time: 10/30/21  1457     History  Chief Complaint  Patient presents with   Headache    Brandy Roberts is a 51 y.o. female.   Headache Associated symptoms: no dizziness, no numbness, no seizures and no weakness    51 year old female presents emergency department with complaints of headache.  She states she has a history of headaches, and this 1 feels the exact same.  She states that headache was gradual onset occurring after involved in an argument with her son.  Pain described as pressure involving her entire head.  She takes nothing at home for migraine/headache but states she always comes to the ED/PCP for migraine cocktail. Denies nausea/vomiting or visual disturbance, facial droop, gait disturbance from baseline, increase in baseline or sensory deficits, difficulty speaking, pronator drift. She has a history of CVA in 2018 that left residual left sided weakness in upper and lower extremity as well as sensory deficits on affected side. She is able to ambulate with assistance of cane at baseline but mostly is mobile with wheelchair. Denies fever, chills, night sweats, cp, sob, n/v/d, abdominal pain, urinary/vaginal symptoms, change in bowel habits.  PMH significant for migraine, stroke 2018, aortic insufficiency, hld, anemia, cardiomyopathy, htn, anemia  Home Medications Prior to Admission medications   Medication Sig Start Date End Date Taking? Authorizing Provider  amLODipine (NORVASC) 10 MG tablet TAKE 1 TABLET BY MOUTH EVERY DAY FOR BLOOD PRESSURE 10/10/21   Freeman Caldron M, PA-C  atorvastatin (LIPITOR) 80 MG tablet TAKE 1 TABLET BY MOUTH EVERY DAY AT 6PM FOR CHOLESTEROL 10/10/21   Argentina Donovan, PA-C  busPIRone (BUSPAR) 10 MG tablet Take 1 tablet (10 mg total) by mouth 3 (three) times daily. 01/28/21   [name], NP  clopidogrel (PLAVIX) 75 MG tablet Take 1 tablet (75 mg total)  by mouth daily. 10/10/21   Argentina Donovan, PA-C  diazepam (VALIUM) 5 MG tablet Please take 30 minutes prior to procedure. May repeat x 1 Patient not taking: Reported on 10/10/2021 08/06/21   Rosemarie Ax, MD  Elastic Bandages & Supports (POST-OP SHOE/SOFT TOP WOMEN) MISC 1 each by Does not apply route daily as needed. 10/10/21   Argentina Donovan, PA-C  FLUoxetine (PROZAC) 10 MG tablet Take 10 mg by mouth daily.    [provider]  flurbiprofen (ANSAID) 100 MG tablet TAKE 1 TABLET BY MOUTH EVERY 8 HOURS AS NEEDED (NOT MORE THAN 3 TABLETS A DAY) 07/04/21   Jaffe, Adam R, DO  Galcanezumab-gnlm 120 MG/ML SOAJ Inject 120 mg into the skin every 30 (thirty) days. 01/15/21   Pieter Partridge, DO  hydrALAZINE (APRESOLINE) 50 MG tablet Take 1 tablet (50 mg total) by mouth 3 (three) times daily. 10/10/21   Argentina Donovan, PA-C  hydrochlorothiazide (MICROZIDE) 12.5 MG capsule Take 1 capsule (12.5 mg total) by mouth daily. 10/10/21   Argentina Donovan, PA-C  HYDROcodone-acetaminophen (NORCO/VICODIN) 5-325 MG tablet Take 1 tablet by mouth every 8 (eight) hours as needed. 08/27/21   Rosemarie Ax, MD  hydrOXYzine (ATARAX/VISTARIL) 50 MG tablet Take 50 mg by mouth 3 (three) times daily as needed for anxiety or sleep.    [provider]  lisinopril (ZESTRIL) 10 MG tablet Take 1 tablet (10 mg total) by mouth daily. 10/10/21   Argentina Donovan, PA-C  methocarbamol (ROBAXIN) 500 MG tablet Take 1 tablet (500 mg total) by mouth 2 (  two) times daily. 04/30/21   Prosperi, Christian H, PA-C  metoCLOPramide (REGLAN) 10 MG tablet Take 10 mg by mouth every 6 (six) hours as needed for nausea or headache.    [provider]  metoprolol tartrate (LOPRESSOR) 25 MG tablet Take 1 tablet (25 mg total) by mouth 2 (two) times daily. 10/10/21   Argentina Donovan, PA-C  Misc. Devices MISC Incontinent supplies 06/19/21   [name], NP  mupirocin ointment (BACTROBAN) 2 % Apply 1 application topically 2 (two)  times daily. 05/21/20   [name], NP  nitroGLYCERIN (NITROSTAT) 0.4 MG SL tablet Place 0.4 mg under the tongue every 5 (five) minutes as needed for chest pain. Patient not taking: Reported on 10/10/2021    [provider]  pantoprazole (PROTONIX) 20 MG tablet Take 1 tablet (20 mg total) by mouth daily. 10/10/21   Argentina Donovan, PA-C  spironolactone (ALDACTONE) 25 MG tablet Take 1 tablet (25 mg total) by mouth daily. 10/10/21   Argentina Donovan, PA-C  traZODone (DESYREL) 100 MG tablet Take 1 tablet (100 mg total) by mouth at bedtime. 10/10/21   Argentina Donovan, PA-C  venlafaxine XR (EFFEXOR-XR) 75 MG 24 hr capsule TAKE 3 CAPSULES BY MOUTH EVERY MORNING WITH BREAKFAST 08/21/21   Pieter Partridge, DO  Vitamin D, Ergocalciferol, (DRISDOL) 1.25 MG (50000 UNIT) CAPS capsule Take 1 capsule (50,000 Units total) by mouth every 7 (seven) days. Take for 8 total doses(weeks) 05/17/21   Rosemarie Ax, MD      Allergies    Ibuprofen and Tylenol [acetaminophen]    Review of Systems   Review of Systems  Neurological:  Positive for headaches. Negative for dizziness, seizures, syncope, facial asymmetry, speech difficulty, weakness, light-headedness and numbness.  All other systems reviewed and are negative.  Physical Exam Updated Vital Signs BP 112/72 (BP Location: Right Arm)   Pulse 77   Temp 98 F (36.7 C) (Oral)   Resp 18   Ht '5\' 6"'$  (1.676 m)   Wt 95.7 kg   LMP 03/19/2016   SpO2 99%   BMI 34.06 kg/m  Physical Exam Vitals and nursing note reviewed.  Constitutional:      General: She is not in acute distress.    Appearance: She is well-developed. She is obese. She is not toxic-appearing or diaphoretic.     Comments: Patient states that headache has almost completed subsided upon exam. She still requests migraine cocktail.  HENT:     Head: Normocephalic and atraumatic.     Mouth/Throat:     Mouth: Mucous membranes are moist.     Pharynx: Oropharynx is clear.  Eyes:      Extraocular Movements: Extraocular movements intact.     Right eye: Normal extraocular motion and no nystagmus.     Left eye: Normal extraocular motion and no nystagmus.     Pupils: Pupils are equal, round, and reactive to light.  Cardiovascular:     Rate and Rhythm: Normal rate and regular rhythm.  Pulmonary:     Effort: Pulmonary effort is normal.     Breath sounds: Normal breath sounds. No wheezing or rales.  Abdominal:     General: Bowel sounds are normal.     Palpations: Abdomen is soft.     Tenderness: There is no abdominal tenderness. There is no guarding.  Musculoskeletal:        General: No swelling or tenderness. Normal range of motion.     Cervical back: Normal range of  motion and neck supple. No rigidity.  Skin:    General: Skin is warm and dry.     Capillary Refill: Capillary refill takes less than 2 seconds.  Neurological:     Mental Status: She is alert and oriented to person, place, and time.     GCS: GCS eye subscore is 4. GCS verbal subscore is 5. GCS motor subscore is 6.     Cranial Nerves: No dysarthria or facial asymmetry.     Sensory: Sensory deficit present.     Motor: Weakness and tremor present. No atrophy, abnormal muscle tone or seizure activity.     Coordination: Finger-Nose-Finger Test abnormal.     Gait: Gait abnormal.     Deep Tendon Reflexes: Reflexes normal.     Comments: CN 3-12 grossly intact. Tremor noted of left hand during finger to nose. Patient states motor and sensory function residual from cva in 2018. No increase in symptoms from baseline.   Patient again expresses gait, finger to nose, romberg, heel to shin, coordination is no different from baseline.   Psychiatric:        Mood and Affect: Mood normal.        Behavior: Behavior normal.    ED Results / Procedures / Treatments   Labs (all labs ordered are listed, but only abnormal results are displayed) Labs Reviewed - No data to display  EKG None  Radiology No results  found.  Procedures Procedures    Medications Ordered in ED Medications  prochlorperazine (COMPAZINE) tablet 10 mg (10 mg Oral Given 10/30/21 1625)  ketorolac (TORADOL) 15 MG/ML injection 15 mg (15 mg Intramuscular Given 10/30/21 1628)  dexamethasone (DECADRON) injection 10 mg (10 mg Intramuscular Given 10/30/21 1625)    ED Course/ Medical Decision Making/ A&P Clinical Course as of 10/30/21 1659  Wed Oct 30, 2021  1600 Patient denies obtaining IV access.  Medicines will be given orally/IM for migraine cocktail. [CR]    Clinical Course User Index [CR] Wilnette Kales, PA                           Medical Decision Making Risk Prescription drug management.   This patient presents to the ED for concern of headache, this involves an extensive number of treatment options, and is a complaint that carries with it a high risk of complications and morbidity.  The differential diagnosis includes Emergent considerations for headache include subarachnoid hemorrhage, meningitis, temporal arteritis, glaucoma, cerebral ischemia, carotid/vertebral dissection, intracranial tumor, Venous sinus thrombosis, carbon monoxide poisoning, acute or chronic subdural hemorrhage.  Other considerations include: Migraine, Cluster headache, Hypertension, Caffeine, alcohol, or drug withdrawal, Pseudotumor cerebri, Arteriovenous malformation, Head injury, Neurocysticercosis, Post-lumbar puncture, Preeclampsia, Tension headache, Sinusitis, Cervical arthritis, Refractive error causing strain, Dental abscess, Otitis media, Temporomandibular joint syndrome, Depression, Somatoform disorder (eg, somatization) Trigeminal neuralgia, Glossopharyngeal neuralgia.    Co morbidities that complicate the patient evaluation  migraine, stroke 2018, aortic insufficiency, hld, anemia, cardiomyopathy, htn, anemia   Additional history obtained:  Additional history obtained from MRI of brain from 10/11/2018 External records from outside  source obtained and reviewed including no acute abnormalities   Lab Tests:  N/a   Imaging Studies ordered:  N/a   Cardiac Monitoring: / EKG:  The patient was maintained on a cardiac monitor.  I personally viewed and interpreted the cardiac monitored which showed an underlying rhythm of: Sinus rhythm   Consultations Obtained:  N/a   Problem List / ED Course /  Critical interventions / Medication management  Headache I ordered medication including  Decadron, ketorolac, compazine  for migraine cocktail  Reevaluation of the patient after these medicines showed that the patient improved I have reviewed the patients home medicines and have made adjustments as needed   Social Determinants of Health:  Cigarette smoker quit 08/31/2017   Test / Admission - Considered:  Headache VS within normal range and stable throughout visit Imaging considered but deemed necessary due to unchanged nature of patient's headache.  Patient's headache had almost completely resided upon examination.  Patient eliciting no change in baseline concerning for acute stroke/TIA.  Migraine cocktail still given IM and orally because patient refused IV. Patient recommended close follow-up with PCP for further management of migraine symptoms as well as potential maintenance therapy. Worrisome signs and symptoms were discussed with patient patient knowledge understanding to return to the emergency department for this.  Patient was stable upon discharge.         Final Clinical Impression(s) / ED Diagnoses Final diagnoses:  Migraine without aura and without status migrainosus, not intractable    Rx / DC Orders ED Discharge Orders     None         Wilnette Kales, [place] 56/38/93 [id number]    Lianne Cure, DO 87/68/11 [id number]

## 2021-10-30 NOTE — Discharge Instructions (Addendum)
Please follow-up with your primary care provider for further treatment/management of your migraines.  This may be a good idea to get on a maintenance medicine for your recurrent migraines instead of having to come to the emergency department every time you have a migraine.  They can also prescribe the medicine to take when if he has a breakthrough headache as well.   That being said, do not hesitate to come to the emergency department if you have any alteration in the nature of your headache that we discussed or if you have any associated symptoms as we discussed, or if your headache is not controlled with medicines over-the-counter.

## 2021-11-05 ENCOUNTER — Ambulatory Visit: Payer: Medicare Other | Attending: Nurse Practitioner | Admitting: Nurse Practitioner

## 2021-11-05 ENCOUNTER — Encounter: Payer: Self-pay | Admitting: Nurse Practitioner

## 2021-11-05 DIAGNOSIS — G43709 Chronic migraine without aura, not intractable, without status migrainosus: Secondary | ICD-10-CM | POA: Diagnosis not present

## 2021-11-05 DIAGNOSIS — I639 Cerebral infarction, unspecified: Secondary | ICD-10-CM | POA: Diagnosis not present

## 2021-11-05 DIAGNOSIS — Z09 Encounter for follow-up examination after completed treatment for conditions other than malignant neoplasm: Secondary | ICD-10-CM

## 2021-11-05 MED ORDER — GALCANEZUMAB-GNLM 120 MG/ML ~~LOC~~ SOAJ: 120.0000 mg | SUBCUTANEOUS | 0 refills | Status: DC

## 2021-11-05 NOTE — Progress Notes (Signed)
Virtual Visit via Telephone Note  I discussed the limitations, risks, security and privacy concerns of performing an evaluation and management service by telephone and the availability of in person appointments. I also discussed with the patient that there may be a patient responsible charge related to this service. The patient expressed understanding and agreed to proceed.    I connected with Brandy Roberts on 11/05/21  at   9:50 AM EDT  EDT by telephone and verified that I am speaking with the correct person using two identifiers.  Location of Patient: Private Residence   Location of Provider: [place] and [employer] Office    Persons participating in Telemedicine visit: Brandy Rankins FNP-BC Brandy Roberts    History of Present Illness: Telemedicine visit for: Migraines She has a past medical history of Anemia, Anxiety, Aortic regurgitation, Cardiomyopathy due to hypertension (10/23/2014), Chronic ischemic vertebrobasilar artery thalamic stroke, Depression, Essential hypertension (05/02/2015), Former smoker (01/24/2015), H/O noncompliance with medical treatment, presenting hazards to health, Headache (05/01/2015), History of ischemic middle cerebral artery stroke embolic, Hyperlipidemia, Hypertension, Hypertensive heart disease, Migraine (05/21/2018), Migraines, Nonrheumatic aortic (valve) insufficiency, Osteoarthritis, Overweight (BMI 25.0-29.9), Palpitations (10/12/2018), Sleep apnea, Snoring (05/02/2015), Stroke (10/25/2016), and Syncope and collapse (10/07/2018).   She was recently treated in the ED on 10-30-2021 for migraine. Blood pressure was normal per ED note however patient states her blood pressure was "320/200 and something" in the ambulance. She was previously seeing Dr. Tomi Likens for migraines and prescribed ansaid and emgailty injections once a month. Today she states she stopped using emgality because "I'm tired of sticking myself". I have recommended she follow up  with DR. Jaffe for treatment options however at this time I will refill her emgality until her appt next week with neurology.      Past Medical History:  Diagnosis Date   Anemia    Anxiety    Aortic regurgitation    Cardiomyopathy due to hypertension ([place]) 10/23/2014   Chest discomfort 10/12/2018   Chronic ischemic vertebrobasilar artery thalamic stroke    Depression    Essential hypertension 05/02/2015   Former smoker 01/24/2015   H/O noncompliance with medical treatment, presenting hazards to health    Headache 05/01/2015   History of ischemic middle cerebral artery stroke embolic    Hyperlipidemia    Hypertension    Hypertensive heart disease    Migraine 05/21/2018   Migraines    Nonrheumatic aortic (valve) insufficiency    Osteoarthritis    Overweight (BMI 25.0-29.9)    Palpitations 10/12/2018   Sleep apnea    Snoring 05/02/2015   Stroke ([place]) 10/25/2016   Syncope and collapse 10/07/2018    Past Surgical History:  Procedure Laterality Date   CESAREAN SECTION     GANGLION CYST EXCISION     TEE WITHOUT CARDIOVERSION N/A 10/09/2014   Procedure: TRANSESOPHAGEAL ECHOCARDIOGRAM (TEE);  Surgeon: Sanda Klein, MD;  Location: [facility] ENDOSCOPY;  Service: Cardiovascular;  Laterality: N/A;   TONSILLECTOMY      Family History  Problem Relation Age of Onset   Cancer Mother        type unknown   Hypertension Mother    Hypertension Sister    Diabetes Sister    Cancer Maternal Aunt        4 aunts died of cancer types unknown    Social History   Socioeconomic History   Marital status: Single    Spouse name: Not on file   Number of children: 2   Years of education: 4  Highest education level: High school graduate  Occupational History   Occupation: disable   Tobacco Use   Smoking status: Former    Packs/day: 0.25    Types: Cigarettes    Quit date: 08/31/2017    Years since quitting: 4.1   Smokeless tobacco: Never  Vaping Use   Vaping Use: Never used  Substance and Sexual  Activity   Alcohol use: No    Alcohol/week: 0.0 standard drinks   Drug use: Yes    Types: Marijuana   Sexual activity: Not on file  Other Topics Concern   Not on file  Social History Narrative   Patient lives with her uncle in a one story home.  Has 2 sons.  Currently on disability.  Education: high school.   Drinks 1-2 sodas a week       Social Determinants of Radio broadcast assistant Strain: Not on file  Food Insecurity: Not on file  Transportation Needs: Not on file  Physical Activity: Not on file  Stress: Not on file  Social Connections: Not on file     Observations/Objective: Awake, alert and oriented x 3   Review of Systems  Constitutional:  Negative for fever, malaise/fatigue and weight loss.  HENT: Negative.  Negative for nosebleeds.   Eyes: Negative.  Negative for blurred vision, double vision and photophobia.  Respiratory: Negative.  Negative for cough and shortness of breath.   Cardiovascular: Negative.  Negative for chest pain, palpitations and leg swelling.  Gastrointestinal: Negative.  Negative for heartburn, nausea and vomiting.  Musculoskeletal: Negative.  Negative for myalgias.  Neurological:  Positive for headaches. Negative for dizziness, focal weakness and seizures.  Psychiatric/Behavioral: Negative.  Negative for suicidal ideas.    Assessment and Plan: Diagnoses and all orders for this visit:  Hospital discharge follow-up  Chronic migraine without aura without status migrainosus, not intractable -     Galcanezumab-gnlm 120 MG/ML SOAJ; Inject 120 mg into the skin every 30 (thirty) days.     Follow Up Instructions No follow-ups on file.     I discussed the assessment and treatment plan with the patient. The patient was provided an opportunity to ask questions and all were answered. The patient agreed with the plan and demonstrated an understanding of the instructions.   The patient was advised to call back or seek an in-person evaluation if  the symptoms worsen or if the condition fails to improve as anticipated.  I provided 11 minutes of non-face-to-face time during this encounter including median intraservice time, reviewing previous notes, labs, imaging, medications and explaining diagnosis and management.  [name], FNP-BC

## 2021-11-06 ENCOUNTER — Ambulatory Visit: Payer: Medicare Other | Attending: Family Medicine

## 2021-11-06 DIAGNOSIS — M5441 Lumbago with sciatica, right side: Secondary | ICD-10-CM | POA: Diagnosis not present

## 2021-11-06 DIAGNOSIS — R262 Difficulty in walking, not elsewhere classified: Secondary | ICD-10-CM | POA: Diagnosis not present

## 2021-11-06 DIAGNOSIS — M6281 Muscle weakness (generalized): Secondary | ICD-10-CM | POA: Diagnosis not present

## 2021-11-06 DIAGNOSIS — R2689 Other abnormalities of gait and mobility: Secondary | ICD-10-CM | POA: Insufficient documentation

## 2021-11-06 DIAGNOSIS — R252 Cramp and spasm: Secondary | ICD-10-CM | POA: Diagnosis not present

## 2021-11-06 DIAGNOSIS — R2681 Unsteadiness on feet: Secondary | ICD-10-CM | POA: Insufficient documentation

## 2021-11-06 DIAGNOSIS — M5416 Radiculopathy, lumbar region: Secondary | ICD-10-CM | POA: Diagnosis not present

## 2021-11-06 DIAGNOSIS — M5442 Lumbago with sciatica, left side: Secondary | ICD-10-CM | POA: Insufficient documentation

## 2021-11-06 NOTE — Therapy (Signed)
OUTPATIENT PHYSICAL THERAPY TREATMENT NOTE  Rationale for Evaluation and Treatment Rehabilitation   Patient Name: Brandy Roberts MRN: [id number] DOB:1971-05-24, 51 y.o., female Today's Date: 11/06/2021  PCP: [name], NP REFERRING PROVIDER: Rosemarie Ax, MD         Past Medical History:  Diagnosis Date   Anemia    Anxiety    Aortic regurgitation    Cardiomyopathy due to hypertension ([place]) 10/23/2014   Chest discomfort 10/12/2018   Chronic ischemic vertebrobasilar artery thalamic stroke    Depression    Essential hypertension 05/02/2015   Former smoker 01/24/2015   H/O noncompliance with medical treatment, presenting hazards to health    Headache 05/01/2015   History of ischemic middle cerebral artery stroke embolic    Hyperlipidemia    Hypertension    Hypertensive heart disease    Migraine 05/21/2018   Migraines    Nonrheumatic aortic (valve) insufficiency    Osteoarthritis    Overweight (BMI 25.0-29.9)    Palpitations 10/12/2018   Sleep apnea    Snoring 05/02/2015   Stroke ([place]) 10/25/2016   Syncope and collapse 10/07/2018   Past Surgical History:  Procedure Laterality Date   CESAREAN SECTION     GANGLION CYST EXCISION     TEE WITHOUT CARDIOVERSION N/A 10/09/2014   Procedure: TRANSESOPHAGEAL ECHOCARDIOGRAM (TEE);  Surgeon: Sanda Klein, MD;  Location: [facility] ENDOSCOPY;  Service: Cardiovascular;  Laterality: N/A;   TONSILLECTOMY     Patient Active Problem List   Diagnosis Date Noted   Vitamin D deficiency 05/15/2021   Facet arthropathy 05/15/2021   SI joint arthritis 05/15/2021   Lumbar radiculopathy 05/01/2021   Obesity (BMI 35.0-39.9 without comorbidity) 11/26/2020   Mixed dyslipidemia 11/26/2020   Anemia    Anxiety    Depression    Hypertension    Migraines    Nonrheumatic aortic (valve) insufficiency    Osteoarthritis    Sleep apnea    Palpitations 10/12/2018   Chest discomfort 10/12/2018   Syncope and collapse 10/07/2018   Migraine  05/21/2018   Stroke ([place]) 10/25/2016   Snoring 05/02/2015   Essential hypertension 05/02/2015   Headache 05/01/2015   Former smoker 01/24/2015   Cardiomyopathy due to hypertension ([place]) 10/23/2014   Chronic ischemic vertebrobasilar artery thalamic stroke    Hypertensive heart disease    Overweight (BMI 25.0-29.9)    H/O noncompliance with medical treatment, presenting hazards to health    Aortic regurgitation    History of ischemic middle cerebral artery stroke embolic    Hyperlipidemia     REFERRING DIAG: lumbar radiculopathy   THERAPY DIAG:  Radiculopathy, lumbar region  Unsteadiness on feet  Cramp and spasm  Muscle weakness (generalized)  PERTINENT HISTORY: CVA, MI, OA, anxiety/depression  PRECAUTIONS: Fall  SUBJECTIVE: Pt reports going to hospital over weekend due to headaches after argument with son which she is already being treated for. Her blood pressure ran way up.  PAIN:    Are you having pain? NO Yes: NPRS scale: 0/10 Pain location: back Pain description: ache Aggravating factors:  Relieving factors:   OBJECTIVE: (TAKEN AT EVALUATION UNLESS OTHERWISE DATED)  ROM / Strength  10/15/21 10/17/21    AROM / PROM / Strength AROM;Strength              AROM      Overall AROM Comments full lumbar flex and right SB; 75% extension and LSB, 50% rotation (may be due to balance deficits  Strength      Overall Strength Comments Right hip flex and knee flex/ext 5/5       Strength Assessment Site Hip;Knee       Right/Left Hip Left       Left Hip Flexion 2+/5       Right/Left Knee Left       Left Knee Flexion 3/5       Left Knee Extension 3/5              Palpation      Palpation comment tender in right gluteals              Special Tests      Other special tests SLS R LE  = < 3 sec   SLS R LE = 5 sec  SLS R = 10 sec; L = 8 sec           Ambulation/Gait      Ambulation/Gait Yes       Ambulation/Gait Assistance 7: Independent       Ambulation  Distance (Feet) 40 Feet       Assistive device Large base quad cane;None       Gait Pattern Step-through pattern;Decreased step length - right;Decreased stance time - left;Wide base of support       Ambulation Surface Level              Balance      Balance Assessed Yes              Standardized Balance Assessment      Standardized Balance Assessment Timed Up and Go Test;Five Times Sit to Stand       Five times sit to stand comments  12.11 sec:   10.56 sec            Timed Up and Go Test      TUG Normal TUG       Normal TUG (seconds) 20 sec  9.19 sec without quad cane     TUG Comments with quad cane at eval             Functional Gait  Assessment      Gait assessed  Yes       Total Score 15  21 10/29/21- 24    FGA comment: goal 5 for age  goal 21 for age  Goal 75 for age           30 TREATMENT:  11/06/21 Gait: 3x 170 ft down and back - rest required after 2x down and back  Therapeutic Exercise:  Church pew 20x SLS staggered stance R/L with EC more challenged with L LE Multifidus walkout with GTB 10x Pallof press with GTB 20x Cybex row 25# 2x10  10/29/2021 Gait training - SBA for safety, patient choosing to not use AD- 3 x 300' with seated rest breaks between  FGA  Stairs  Therapeutic Exercise: to improve strength and mobility.  Demo, verbal and tactile cues throughout for technique. Cybex knee flexion 20#  2 x 10 reps Cybex knee extension 20#  2 x 10 reps  10/23/21 Gait Training:   2x 170 ft  x 2 rounds rest break required,after each round  Therapeutic Exercise: Sit to stand 8# 1 x 10, 1 x 5   Cybex knee flexion 15# x 10 reps  Cybex knee extension 5# x 10, 10# 2 x 5  Cybex row 25# 2x10  Cybex chest press 10# 2x5  Leg press  15# 2x10  Seated clamshell with GTB 2x10  Seated marches with GTB 2x10  Single leg march L with YTB 2x 10  Neuro- Reed:  Braiding 4 x 10 ft with intermittent UE support (limited by spasms on last rep) SLS multiple reps bil; limited  to 4 sec today bil. Used intermittent UE support and finger touch for support     PATIENT EDUCATION: Education details:   HEP Person educated: Patient Education method: Customer service manager Education comprehension: verbalized understanding and returned demonstration   HOME EXERCISE PROGRAM: [id number]   ASSESSMENT:      Clinical Impression Statement Pt arrived late to session today. Worked on balance and core strengthening in WB to facilitate co-contraction of muscles to improve stability. Pt had difficulty with staggered SLS more notably with L LE. Cues and reminders given with TB walkouts for form and to keep stable core. Pt was fatigued periodically during session with  gait and TB exercises in standing.    Personal Factors and Comorbidities Comorbidity 3+     Comorbidities LBP, CVA affectiing left side, anxiety/depression     Examination-Activity Limitations Locomotion Level     Stability/Clinical Decision Making Stable/Uncomplicated     Clinical Decision Making Moderate     Rehab Potential Good          PT Short Term Goals - Target Date 10/01/21      PT SHORT TERM GOAL #1   Title Patient to be independent with initial HEP.    Baseline previous HEP needs to be reviewed    Time 2    Period Weeks    Status 10/29/21 Acheived   Target Date 10/01/21      PT SHORT TERM GOAL #2   Title Improved sit to stand to < 10 sec    Baseline 10.19 sec on 10/15/21   Time 4    Period Weeks    Status On-going (14 sec on 10/21/21)           PT Long Term Goals - Target Date 10/29/21       PT LONG TERM GOAL #1   Title Patient to be independent with advanced HEP.    Baseline has previous HEP from last episode    Time 6    Period Weeks    Status On-going 10/29/21- reports compliance   Target Date 10/29/21      PT LONG TERM GOAL #2   Title Patient to demonstrate improved FGA to 25    Baseline FGA = 15    Time 6    Period Weeks    Status On-going  10/29/21- 23 pts    Target Date 10/29/21      PT LONG TERM GOAL #3   Title Patient will report 50% improvement in LBP/radicular symptoms.    Baseline 3/10 pain at eval    Time 6    Period Weeks    Status Achieved   Target Date 10/29/21      PT LONG TERM GOAL #4   Title Patient will tolerated standing/walking for 5 minutes without severe LBP/bil LE to perform ADLs    Baseline Limited by fatigue vs pain today (10/15/21)   Time 6    Period Weeks    Status Achieved    Target Date 10/29/21      PT LONG TERM GOAL #5   Title Patient will demonstrate improved physical functioning by improving FOTO score to 52.    Baseline FOTO = 46    Time 6  Period Weeks    Status On-going 10/29/21- 47%   Target Date 10/29/21              PLAN:       PT Frequency 1-2x / week     PT Duration 4 weeks to 11/26/21    PT Treatment/Interventions ADLs/Self Care Home Management;Aquatic Therapy;Electrical Stimulation;Moist Heat;Cryotherapy;Traction;Neuromuscular re-education;Balance training;Therapeutic exercise;Therapeutic activities;Gait training;Patient/family education;Manual techniques;Dry needling     PT Next Visit Plan Continue to work on balance/gait and functional strength, review [place] [phone] (from previous episode)     Consulted and Agree with Plan of Care Patient              Patient will benefit from skilled therapeutic intervention in order to improve the following deficits and impairments:  Abnormal gait, Decreased range of motion, Pain, Increased muscle spasms, Decreased activity tolerance, Decreased strength, Decreased balance   Dollie Bressi L Ladarien Beeks, PTA 11/06/2021, 9:08 AM

## 2021-11-07 NOTE — Therapy (Signed)
OUTPATIENT PHYSICAL THERAPY TREATMENT NOTE  Rationale for Evaluation and Treatment Rehabilitation   Patient Name: Brandy Roberts MRN: [id number] DOB:January 24, 1971, 51 y.o., female Today's Date: 11/07/2021  PCP: [name], NP REFERRING PROVIDER: Rosemarie Ax, MD         Past Medical History:  Diagnosis Date   Anemia    Anxiety    Aortic regurgitation    Cardiomyopathy due to hypertension ([place]) 10/23/2014   Chest discomfort 10/12/2018   Chronic ischemic vertebrobasilar artery thalamic stroke    Depression    Essential hypertension 05/02/2015   Former smoker 01/24/2015   H/O noncompliance with medical treatment, presenting hazards to health    Headache 05/01/2015   History of ischemic middle cerebral artery stroke embolic    Hyperlipidemia    Hypertension    Hypertensive heart disease    Migraine 05/21/2018   Migraines    Nonrheumatic aortic (valve) insufficiency    Osteoarthritis    Overweight (BMI 25.0-29.9)    Palpitations 10/12/2018   Sleep apnea    Snoring 05/02/2015   Stroke ([place]) 10/25/2016   Syncope and collapse 10/07/2018   Past Surgical History:  Procedure Laterality Date   CESAREAN SECTION     GANGLION CYST EXCISION     TEE WITHOUT CARDIOVERSION N/A 10/09/2014   Procedure: TRANSESOPHAGEAL ECHOCARDIOGRAM (TEE);  Surgeon: Sanda Klein, MD;  Location: [facility] ENDOSCOPY;  Service: Cardiovascular;  Laterality: N/A;   TONSILLECTOMY     Patient Active Problem List   Diagnosis Date Noted   Vitamin D deficiency 05/15/2021   Facet arthropathy 05/15/2021   SI joint arthritis 05/15/2021   Lumbar radiculopathy 05/01/2021   Obesity (BMI 35.0-39.9 without comorbidity) 11/26/2020   Mixed dyslipidemia 11/26/2020   Anemia    Anxiety    Depression    Hypertension    Migraines    Nonrheumatic aortic (valve) insufficiency    Osteoarthritis    Sleep apnea    Palpitations 10/12/2018   Chest discomfort 10/12/2018   Syncope and collapse 10/07/2018   Migraine  05/21/2018   Stroke ([place]) 10/25/2016   Snoring 05/02/2015   Essential hypertension 05/02/2015   Headache 05/01/2015   Former smoker 01/24/2015   Cardiomyopathy due to hypertension ([place]) 10/23/2014   Chronic ischemic vertebrobasilar artery thalamic stroke    Hypertensive heart disease    Overweight (BMI 25.0-29.9)    H/O noncompliance with medical treatment, presenting hazards to health    Aortic regurgitation    History of ischemic middle cerebral artery stroke embolic    Hyperlipidemia     REFERRING DIAG: lumbar radiculopathy   THERAPY DIAG:  No diagnosis found.  PERTINENT HISTORY: CVA, MI, OA, anxiety/depression  PRECAUTIONS: Fall  SUBJECTIVE: ***  PAIN:    Are you having pain? *** No Yes: NPRS scale: 0/10 Pain location: back Pain description: ache Aggravating factors:  Relieving factors:   OBJECTIVE: (TAKEN AT EVALUATION UNLESS OTHERWISE DATED)  ROM / Strength  10/15/21 10/17/21    AROM / PROM / Strength AROM;Strength              AROM      Overall AROM Comments full lumbar flex and right SB; 75% extension and LSB, 50% rotation (may be due to balance deficits              Strength      Overall Strength Comments Right hip flex and knee flex/ext 5/5       Strength Assessment Site Hip;Knee       Right/Left Hip Left  Left Hip Flexion 2+/5       Right/Left Knee Left       Left Knee Flexion 3/5       Left Knee Extension 3/5              Palpation      Palpation comment tender in right gluteals              Special Tests      Other special tests SLS R LE  = < 3 sec   SLS R LE = 5 sec  SLS R = 10 sec; L = 8 sec           Ambulation/Gait      Ambulation/Gait Yes       Ambulation/Gait Assistance 7: Independent       Ambulation Distance (Feet) 40 Feet       Assistive device Large base quad cane;None       Gait Pattern Step-through pattern;Decreased step length - right;Decreased stance time - left;Wide base of support       Ambulation Surface Level               Balance      Balance Assessed Yes              Standardized Balance Assessment      Standardized Balance Assessment Timed Up and Go Test;Five Times Sit to Stand       Five times sit to stand comments  12.11 sec:   10.56 sec            Timed Up and Go Test      TUG Normal TUG       Normal TUG (seconds) 20 sec  9.19 sec without quad cane     TUG Comments with quad cane at eval             Functional Gait  Assessment      Gait assessed  Yes       Total Score 15  21 10/29/21- 24    FGA comment: goal 68 for age  goal 32 for age  Goal 81 for age           77 TREATMENT:   11/12/21 ***  11/06/21 Gait: 3x 170 ft down and back - rest required after 2x down and back  Therapeutic Exercise:  Church pew 20x SLS staggered stance R/L with EC more challenged with L LE Multifidus walkout with GTB 10x Pallof press with GTB 20x Cybex row 25# 2x10  10/29/2021 Gait training - SBA for safety, patient choosing to not use AD- 3 x 300' with seated rest breaks between  FGA  Stairs  Therapeutic Exercise: to improve strength and mobility.  Demo, verbal and tactile cues throughout for technique. Cybex knee flexion 20#  2 x 10 reps Cybex knee extension 20#  2 x 10 reps  10/23/21 Gait Training:   2x 170 ft  x 2 rounds rest break required,after each round  Therapeutic Exercise: Sit to stand 8# 1 x 10, 1 x 5   Cybex knee flexion 15# x 10 reps  Cybex knee extension 5# x 10, 10# 2 x 5  Cybex row 25# 2x10  Cybex chest press 10# 2x5  Leg press 15# 2x10  Seated clamshell with GTB 2x10  Seated marches with GTB 2x10  Single leg march L with YTB 2x 10  Neuro- Reed:  Braiding 4 x 10 ft with intermittent UE support (limited  by spasms on last rep) SLS multiple reps bil; limited to 4 sec today bil. Used intermittent UE support and finger touch for support     PATIENT EDUCATION: **8 Education details:   HEP Person educated: Patient Education method: Holiday representative Education comprehension: verbalized understanding and returned demonstration   HOME EXERCISE PROGRAM: [id number]   ASSESSMENT:      Clinical Impression Statement *** Pt arrived late to session today. Worked on balance and core strengthening in WB to facilitate co-contraction of muscles to improve stability. Pt had difficulty with staggered SLS more notably with L LE. Cues and reminders given with TB walkouts for form and to keep stable core. Pt was fatigued periodically during session with  gait and TB exercises in standing.    Personal Factors and Comorbidities Comorbidity 3+     Comorbidities LBP, CVA affectiing left side, anxiety/depression     Examination-Activity Limitations Locomotion Level     Stability/Clinical Decision Making Stable/Uncomplicated     Clinical Decision Making Moderate     Rehab Potential Good          PT Short Term Goals - Target Date 10/01/21      PT SHORT TERM GOAL #1   Title Patient to be independent with initial HEP.    Baseline previous HEP needs to be reviewed    Time 2    Period Weeks    Status 10/29/21 Acheived   Target Date 10/01/21      PT SHORT TERM GOAL #2   Title Improved sit to stand to < 10 sec    Baseline 10.19 sec on 10/15/21   Time 4    Period Weeks    Status On-going (14 sec on 10/21/21)           PT Long Term Goals - Target Date 10/29/21       PT LONG TERM GOAL #1   Title Patient to be independent with advanced HEP.    Baseline has previous HEP from last episode    Time 6    Period Weeks    Status On-going 10/29/21- reports compliance   Target Date 10/29/21      PT LONG TERM GOAL #2   Title Patient to demonstrate improved FGA to 25    Baseline FGA = 15    Time 6    Period Weeks    Status On-going  10/29/21- 23 pts   Target Date 10/29/21      PT LONG TERM GOAL #3   Title Patient will report 50% improvement in LBP/radicular symptoms.    Baseline 3/10 pain at eval    Time 6    Period Weeks    Status  Achieved   Target Date 10/29/21      PT LONG TERM GOAL #4   Title Patient will tolerated standing/walking for 5 minutes without severe LBP/bil LE to perform ADLs    Baseline Limited by fatigue vs pain today (10/15/21)   Time 6    Period Weeks    Status Achieved    Target Date 10/29/21      PT LONG TERM GOAL #5   Title Patient will demonstrate improved physical functioning by improving FOTO score to 52.    Baseline FOTO = 46    Time 6    Period Weeks    Status On-going 10/29/21- 47%   Target Date 10/29/21              PLAN:  PT Frequency 1-2x / week     PT Duration 4 weeks to 11/26/21    PT Treatment/Interventions ADLs/Self Care Home Management;Aquatic Therapy;Electrical Stimulation;Moist Heat;Cryotherapy;Traction;Neuromuscular re-education;Balance training;Therapeutic exercise;Therapeutic activities;Gait training;Patient/family education;Manual techniques;Dry needling     PT Next Visit Plan Continue to work on balance/gait and functional strength, review [place] [phone] (from previous episode)     Consulted and Agree with Plan of Care Patient              Patient will benefit from skilled therapeutic intervention in order to improve the following deficits and impairments:  Abnormal gait, Decreased range of motion, Pain, Increased muscle spasms, Decreased activity tolerance, Decreased strength, Decreased balance   Betta Balla, PT 11/07/2021, 8:32 AM

## 2021-11-12 ENCOUNTER — Ambulatory Visit: Payer: Medicare Other | Admitting: Physical Therapy

## 2021-11-12 ENCOUNTER — Encounter: Payer: Self-pay | Admitting: Physical Therapy

## 2021-11-12 DIAGNOSIS — R252 Cramp and spasm: Secondary | ICD-10-CM

## 2021-11-12 DIAGNOSIS — M5442 Lumbago with sciatica, left side: Secondary | ICD-10-CM | POA: Diagnosis not present

## 2021-11-12 DIAGNOSIS — R2689 Other abnormalities of gait and mobility: Secondary | ICD-10-CM | POA: Diagnosis not present

## 2021-11-12 DIAGNOSIS — R2681 Unsteadiness on feet: Secondary | ICD-10-CM

## 2021-11-12 DIAGNOSIS — M5441 Lumbago with sciatica, right side: Secondary | ICD-10-CM | POA: Diagnosis not present

## 2021-11-12 DIAGNOSIS — R262 Difficulty in walking, not elsewhere classified: Secondary | ICD-10-CM | POA: Diagnosis not present

## 2021-11-12 DIAGNOSIS — M6281 Muscle weakness (generalized): Secondary | ICD-10-CM | POA: Diagnosis not present

## 2021-11-12 DIAGNOSIS — M5416 Radiculopathy, lumbar region: Secondary | ICD-10-CM | POA: Diagnosis not present

## 2021-11-15 ENCOUNTER — Encounter: Payer: Medicare Other | Admitting: Physical Therapy

## 2021-11-19 ENCOUNTER — Ambulatory Visit: Payer: Medicare Other

## 2021-11-19 DIAGNOSIS — M5441 Lumbago with sciatica, right side: Secondary | ICD-10-CM | POA: Diagnosis not present

## 2021-11-19 DIAGNOSIS — R252 Cramp and spasm: Secondary | ICD-10-CM

## 2021-11-19 DIAGNOSIS — R2689 Other abnormalities of gait and mobility: Secondary | ICD-10-CM

## 2021-11-19 DIAGNOSIS — R262 Difficulty in walking, not elsewhere classified: Secondary | ICD-10-CM | POA: Diagnosis not present

## 2021-11-19 DIAGNOSIS — M5442 Lumbago with sciatica, left side: Secondary | ICD-10-CM | POA: Diagnosis not present

## 2021-11-19 DIAGNOSIS — M5416 Radiculopathy, lumbar region: Secondary | ICD-10-CM

## 2021-11-19 DIAGNOSIS — R2681 Unsteadiness on feet: Secondary | ICD-10-CM

## 2021-11-19 DIAGNOSIS — M6281 Muscle weakness (generalized): Secondary | ICD-10-CM

## 2021-11-19 NOTE — Therapy (Addendum)
OUTPATIENT PHYSICAL THERAPY TREATMENT NOTE PHYSICAL THERAPY DISCHARGE SUMMARY  Visits from Start of Care: 11  Current functional level related to goals / functional outcomes: Improved strength, mobility, and walking tolerance.  FGA 24/30 indicating decreased risk of falls.    Remaining deficits: See below   Education / Equipment: HEP  Plan: Patient goals were not met. Patient is being discharged due to attendance policy after 3 no-shows (no-showed and cancelled last 4 visits).      Rennie Natter, PT, DPT 4:33 PM 12/19/2021   Rationale for Evaluation and Treatment Rehabilitation   Patient Name: Brandy Roberts MRN: [id number] DOB:Oct 29, 1970, 50 y.o., female Today's Date: 11/19/2021  PCP: [name], NP REFERRING PROVIDER: Rosemarie Ax, MD   PT End of Session - 11/19/21 0931     Visit Number 11    Number of Visits 12    Date for PT Re-Evaluation 11/26/21    Authorization Type [employer] MCR    Progress Note Due on Visit 18    PT Start Time 0848    PT Stop Time 0929    PT Time Calculation (min) 41 min    Activity Tolerance Patient tolerated treatment well    Behavior During Therapy [facility] for tasks assessed/performed                   Past Medical History:  Diagnosis Date   Anemia    Anxiety    Aortic regurgitation    Cardiomyopathy due to hypertension ([place]) 10/23/2014   Chest discomfort 10/12/2018   Chronic ischemic vertebrobasilar artery thalamic stroke    Depression    Essential hypertension 05/02/2015   Former smoker 01/24/2015   H/O noncompliance with medical treatment, presenting hazards to health    Headache 05/01/2015   History of ischemic middle cerebral artery stroke embolic    Hyperlipidemia    Hypertension    Hypertensive heart disease    Migraine 05/21/2018   Migraines    Nonrheumatic aortic (valve) insufficiency    Osteoarthritis    Overweight (BMI 25.0-29.9)    Palpitations 10/12/2018   Sleep apnea    Snoring 05/02/2015    Stroke ([place]) 10/25/2016   Syncope and collapse 10/07/2018   Past Surgical History:  Procedure Laterality Date   CESAREAN SECTION     GANGLION CYST EXCISION     TEE WITHOUT CARDIOVERSION N/A 10/09/2014   Procedure: TRANSESOPHAGEAL ECHOCARDIOGRAM (TEE);  Surgeon: Sanda Klein, MD;  Location: [facility] ENDOSCOPY;  Service: Cardiovascular;  Laterality: N/A;   TONSILLECTOMY     Patient Active Problem List   Diagnosis Date Noted   Vitamin D deficiency 05/15/2021   Facet arthropathy 05/15/2021   SI joint arthritis 05/15/2021   Lumbar radiculopathy 05/01/2021   Obesity (BMI 35.0-39.9 without comorbidity) 11/26/2020   Mixed dyslipidemia 11/26/2020   Anemia    Anxiety    Depression    Hypertension    Migraines    Nonrheumatic aortic (valve) insufficiency    Osteoarthritis    Sleep apnea    Palpitations 10/12/2018   Chest discomfort 10/12/2018   Syncope and collapse 10/07/2018   Migraine 05/21/2018   Stroke ([place]) 10/25/2016   Snoring 05/02/2015   Essential hypertension 05/02/2015   Headache 05/01/2015   Former smoker 01/24/2015   Cardiomyopathy due to hypertension ([place]) 10/23/2014   Chronic ischemic vertebrobasilar artery thalamic stroke    Hypertensive heart disease    Overweight (BMI 25.0-29.9)    H/O noncompliance with medical treatment, presenting hazards to health  Aortic regurgitation    History of ischemic middle cerebral artery stroke embolic    Hyperlipidemia     REFERRING DIAG: lumbar radiculopathy   THERAPY DIAG:  Radiculopathy, lumbar region  Unsteadiness on feet  Cramp and spasm  Muscle weakness (generalized)  Acute midline low back pain with bilateral sciatica  Difficulty in walking, not elsewhere classified  Other abnormalities of gait and mobility  PERTINENT HISTORY: CVA, MI, OA, anxiety/depression  PRECAUTIONS: Fall  SUBJECTIVE: Having knee pain today, feels like it is tearing at times.  PAIN:    Are you having pain?  No Yes: NPRS scale: 4/10 Pain  location: back Pain description: ache Aggravating factors:  Relieving factors:   OBJECTIVE: (TAKEN AT EVALUATION UNLESS OTHERWISE DATED)  ROM / Strength  10/15/21 10/17/21    AROM / PROM / Strength AROM;Strength              AROM      Overall AROM Comments full lumbar flex and right SB; 75% extension and LSB, 50% rotation (may be due to balance deficits              Strength      Overall Strength Comments Right hip flex and knee flex/ext 5/5       Strength Assessment Site Hip;Knee       Right/Left Hip Left       Left Hip Flexion 2+/5       Right/Left Knee Left       Left Knee Flexion 3/5       Left Knee Extension 3/5              Palpation      Palpation comment tender in right gluteals              Special Tests      Other special tests SLS R LE  = < 3 sec   SLS R LE = 5 sec  SLS R = 10 sec; L = 8 sec           Ambulation/Gait      Ambulation/Gait Yes       Ambulation/Gait Assistance 7: Independent       Ambulation Distance (Feet) 40 Feet       Assistive device Large base quad cane;None       Gait Pattern Step-through pattern;Decreased step length - right;Decreased stance time - left;Wide base of support       Ambulation Surface Level              Balance      Balance Assessed Yes              Standardized Balance Assessment      Standardized Balance Assessment Timed Up and Go Test;Five Times Sit to Stand       Five times sit to stand comments  12.11 sec:   10.56 sec            Timed Up and Go Test      TUG Normal TUG       Normal TUG (seconds) 20 sec  9.19 sec without quad cane     TUG Comments with quad cane at eval             Functional Gait  Assessment      Gait assessed  Yes       Total Score 15  21 10/29/21- 24    FGA comment: goal 24 for age  goal  13 for age  Goal 12 for age           62 TREATMENT:  11/19/21 Therapeutic Exercise: Gait for 4x 170 ft for warm up FGA completed and 10x STS attempted but limited by pain with 5 reps Seated clams  blue TB 10x3" Seated march blue TB 10x Seated ball squeeze w/ B hip flexion 10x  11/12/21 Gait: 4 laps x 170 ft 3 min 05 sec; then 2 laps x 170 ft in 1 min 24 sec no AD after 1 min rest  Therapeutic Exercise:  Church pew 20x SLS staggered stance R/L with EC more challenged with L LE Multifidus walkout with GTB 10x Pallof press with GTB 20x Cybex row 25# 2x10 Split stance GTB row x 10 each side, horizontal ABD GTB in split stance x 10 ea side Seated clamshell with GTB 2x10  Seated marches with GTB 2x10 Standing sidestepping GTB 4 steps x 6 ; 2 sets  11/06/21 Gait: 3x 170 ft down and back - rest required after 2x down and back  Therapeutic Exercise:  Church pew 20x SLS staggered stance R/L with EC more challenged with L LE Multifidus walkout with GTB 10x Pallof press with GTB 20x Cybex row 25# 2x10  10/29/2021 Gait training - SBA for safety, patient choosing to not use AD- 3 x 300' with seated rest breaks between  FGA  Stairs  Therapeutic Exercise: to improve strength and mobility.  Demo, verbal and tactile cues throughout for technique. Cybex knee flexion 20#  2 x 10 reps Cybex knee extension 20#  2 x 10 reps  10/23/21 Gait Training:   2x 170 ft  x 2 rounds rest break required,after each round  Therapeutic Exercise: Sit to stand 8# 1 x 10, 1 x 5   Cybex knee flexion 15# x 10 reps  Cybex knee extension 5# x 10, 10# 2 x 5  Cybex row 25# 2x10  Cybex chest press 10# 2x5  Leg press 15# 2x10  Seated clamshell with GTB 2x10  Seated marches with GTB 2x10  Single leg march L with YTB 2x 10  Neuro- Reed:  Braiding 4 x 10 ft with intermittent UE support (limited by spasms on last rep) SLS multiple reps bil; limited to 4 sec today bil. Used intermittent UE support and finger touch for support     PATIENT EDUCATION:  Education details:   HEP Person educated: Patient Education method: Customer service manager Education comprehension: verbalized understanding and  returned demonstration   HOME EXERCISE PROGRAM: [id number]   ASSESSMENT:      Clinical Impression Statement  Marguita is showing improvements in her FGA score but shows most difficulty with stairs, narrow stance, and stepping over obstaclesShe was limited by R knee pain with STS test, she was only able to do 5 reps until being limited by pain. She is walking more w/o her AD and showing more stability everyday ambulation. Due to the increased pain we did seated TE for proximal strengthening and also core activation. Pt is progressing toward goals and would continue to benefit from PT to address remaining balance deficits to restore function.         Personal Factors and Comorbidities Comorbidity 3+     Comorbidities LBP, CVA affectiing left side, anxiety/depression     Examination-Activity Limitations Locomotion Level     Stability/Clinical Decision Making Stable/Uncomplicated     Clinical Decision Making Moderate     Rehab Potential Good  PT Short Term Goals - Target Date 10/01/21      PT SHORT TERM GOAL #1   Title Patient to be independent with initial HEP.    Baseline previous HEP needs to be reviewed    Time 2    Period Weeks    Status 10/29/21 Acheived   Target Date 10/01/21      PT SHORT TERM GOAL #2   Title Improved sit to stand to < 10 sec    Baseline 10.19 sec on 10/15/21   Time 4    Period Weeks    Status On-going (pt limited to 5x by R knee pain)            PT Long Term Goals - Target Date 10/29/21       PT LONG TERM GOAL #1   Title Patient to be independent with advanced HEP.    Baseline has previous HEP from last episode    Time 6    Period Weeks    Status On-going 10/29/21- reports compliance   Target Date 10/29/21      PT LONG TERM GOAL #2   Title Patient to demonstrate improved FGA to 25    Baseline FGA = 15    Time 6    Period Weeks    Status On-going  11/19/21- 24/30   Target Date 10/29/21      PT LONG TERM GOAL #3   Title Patient  will report 50% improvement in LBP/radicular symptoms.    Baseline 3/10 pain at eval    Time 6    Period Weeks    Status Achieved   Target Date 10/29/21      PT LONG TERM GOAL #4   Title Patient will tolerated standing/walking for 5 minutes without severe LBP/bil LE to perform ADLs    Baseline Limited by fatigue vs pain today (10/15/21)   Time 6    Period Weeks    Status Achieved    Target Date 10/29/21      PT LONG TERM GOAL #5   Title Patient will demonstrate improved physical functioning by improving FOTO score to 52.    Baseline FOTO = 46    Time 6    Period Weeks    Status On-going 10/29/21- 47%   Target Date 10/29/21              PLAN:       PT Frequency 1-2x / week     PT Duration 4 weeks to 11/26/21    PT Treatment/Interventions ADLs/Self Care Home Management;Aquatic Therapy;Electrical Stimulation;Moist Heat;Cryotherapy;Traction;Neuromuscular re-education;Balance training;Therapeutic exercise;Therapeutic activities;Gait training;Patient/family education;Manual techniques;Dry needling     PT Next Visit Plan Continue to work on balance/gait and functional strength    PT [place] [phone] (from previous episode)     Consulted and Agree with Plan of Care Patient              Patient will benefit from skilled therapeutic intervention in order to improve the following deficits and impairments:  Abnormal gait, Decreased range of motion, Pain, Increased muscle spasms, Decreased activity tolerance, Decreased strength, Decreased balance   Christel Bai L Sharda Keddy, PTA 11/19/2021, 12:13 PM

## 2021-11-22 ENCOUNTER — Ambulatory Visit: Payer: Medicare Other

## 2021-11-26 ENCOUNTER — Encounter: Payer: Medicare Other | Admitting: Physical Therapy

## 2021-12-04 ENCOUNTER — Ambulatory Visit: Payer: Medicare Other | Attending: Family Medicine | Admitting: Physical Therapy

## 2021-12-04 ENCOUNTER — Other Ambulatory Visit: Payer: Self-pay | Admitting: Physician Assistant

## 2021-12-04 ENCOUNTER — Other Ambulatory Visit: Payer: Self-pay | Admitting: Neurology

## 2021-12-04 DIAGNOSIS — I10 Essential (primary) hypertension: Secondary | ICD-10-CM

## 2021-12-06 DIAGNOSIS — I639 Cerebral infarction, unspecified: Secondary | ICD-10-CM | POA: Diagnosis not present

## 2021-12-13 NOTE — Progress Notes (Unsigned)
NEUROLOGY FOLLOW UP OFFICE NOTE  Brandy Roberts [id number]  Assessment/Plan:   1.  Migraine without aura without status migrainosus, not intractable - today with a persistent headache for a week. 2. Recurrent loss of consciousness.  Semiology characteristic of syncope.  May be due to orthostatic hypotension, vasovagal or cardiac source.  The first event occurred in setting of severe migraine, which may have been vasovagal.  MRI of brain and EEG unremarkable. 3.  Frequent falls.  I think this is secondary to her residual left sided weakness.  Encouraged her to use walker instead of cane if needed.       1.  Migraine prophylaxis:  She feels uncomfortable using the injection.  Will try to switch from Emgality to Eagle '60mg'$  dail; Continue venlafaxine XR '225mg'$  daily (will have her discontinue fluoxetine as to avoid two serotonin reuptake inhibitors) 3.  Migraine rescue:  Flurbiprofen '100mg'$  4.  Limit use of pain relievers to no more than 2 days out of week to prevent risk of rebound or medication-overuse headache. 5.  Keep headache diary 6.  Follow up in 6 months.     Subjective:  Brandy Roberts is a 51 year old right-handed woman with aortic insufficiency, hypertension, hyperlipidemia and history of stroke who follows up for migraines.   UPDATE: Intensity: Moderate Duration: 2 to 3 hours Frequency: 2 days a month She doesn't like using the Emgality.  She doesn't like injecting herself.     She did have an intractable migraine on 5/31 requiring ED visit.  Current NSAIDS: Flurbiprofen '100mg'$  Current analgesics: None Current triptans: None Current ergotamine: None Current anti-emetic:  Reglan 10 mg Current muscle relaxants: None Current anti-anxiolytic: None Current sleep aide: trazodone Current Antihypertensive medications: Amlodipine 10 mg, hydralazine, Spironolactone, lisinopril, Lopressor Current Antidepressant medications: Venlafaxine XR 225 mg, fluoxetine '10mg'$  Current  Anticonvulsant medications:Lyrica Current anti-CGRP: Emgality Current Vitamins/Herbal/Supplements: None Current Antihistamines/Decongestants: None Other therapy: None Hormone/birth control: None     Caffeine: Coffee, sweet tea, not daily Alcohol: No Smoker: Cigarettes Diet: Hydrates Exercise: No Depression: Yes; Anxiety: Yes Other pain: Chronic right knee pain Sleep hygiene: Sometimes tosses and turns     HISTORY: MIGRAINES Onset: Migraines since she was young, but worse since her stroke in May 2016. Location:  holocephalic Quality:  pounding Initial Intensity:  10/10 Aura:  no Prodrome:  no Postdrome:  no Associated symptoms: Photophobia, phonophobia.  Rarely nausea.  She has not had any new worse headache of her life, waking up from sleep Initial Duration:  2 days (but Fioricet and ibuprofen lowers intensity down to 2-3/10) Initial Frequency:  Once or twice a month Frequency of abortive medication: only as needed Triggers: None Relieving factors:  Laying down.  Butalbital, ibuprofen.  Tramadol helped but she was told not to take it. Activity:  aggravates   Past NSAIDS:  Ibuprofen, naproxen Past analgesics:  Tylenol, Excedrin, Tramadol (effective but GI upset), Fioricet Past abortive triptans:  no Past muscle relaxants:  no Past anti-emetic:  Zofran Past antihypertensive medications:  no Past antidepressant medications:  no Past anticonvulsant medications: topiramate, gabapentin Past CGRP inhibitor:  None Past vitamins/Herbal/Supplements:  no Other past therapies:  no   Family history of headache:  Sister.   STROKE:  She had a stroke in May 2016 with left sided weakness.  MRI of brain from 10/06/14 was personally reviewed and revealed small acute lacunar infarcts in the right thalamus and right corona radiata, as well as chronic lacunar infarcts in the left hemisphere.  CTA of  head and neck revealed diffuse bilateral petrous and cavernous carotid stenosis.  Cardiac  source of embolus was not discovered.     She was admitted to [facility] from 10/24/16 to 10/26/16 for stroke-like event.  She suddenly developed left sided weakness and slurred speech with associated headache.  Blood pressure in ED was 120/66.  CT of head was personally reviewed and negative for acute abnormality.  She had refused tPA.  MRI of brain was personally reviewed and revealed no acute stroke or bleed.  MRA of head revealed no significant intracranial stenosis or occlusion.  LDL was 84.  Hgb A1c was 5.7.  As per neurology evaluation, her deficits appeared to be non-organic, with unusual speech pattern and giveaway weakness.  Somatization was suspected.  2D echo from 11/06/16 demonstrated normal LV EF of 65-70% with no cardiac source of emboli.  Atorvastatin was increased from '10mg'$  to '80mg'$  daily.  Differential includes TIA vs hemiplegic migraine vs somatization.   SYNCOPE: On 09/21/18, she presented to the ED at [facility] for a 5 day intractable holocephalic throbbing headache.  She reports that when EMS was leading her to the ambulance from her bedroom, she passed out for 20-30 seconds.  She described sensation of lightheadedness, like she was going to pass out, as well as tunnel vision and palpitations.  She was afebrile.  CT of head revealed her old left basal ganglia infarct but no acute abnormality.  She was treated with Reglan and Benadryl and headache reportedly resolved.  On 09/26/18, she was sitting on her barstool when she felt lightheaded again.  She noted palpitations as well.  She got up and started walking to her bedroom when she noted tunnel vision and passed out.  She woke up on the floor and bruised her back and leg.  She thinks she may have been unconsciousness for 20 to 30 minute.  There were no witnesses.  She noted urinary incontinence but did not bite her tongue.  She did not have a headache.  She denies change in medications over the past week.  She denies fever or illness.   Due to episodes of recurrent syncopal events, she underwent workup.  MRI of brain with and without contrast on 10/11/18 was personally reviewed and demonstrated stable chronic infarcts but no acute intracranial abnormality.  EEG performed on 10/21/18 was normal. No recurrent spells.   FALLS: In 2019, she endorsed increased problems with balance and falls.  She stated that they were associated with headache and worsening of her left-sided weakness.  MRI of the brain without contrast from 11/13/2017 was personally reviewed and demonstrated no new intracranial abnormalities.  PAST MEDICAL HISTORY: Past Medical History:  Diagnosis Date   Anemia    Anxiety    Aortic regurgitation    Cardiomyopathy due to hypertension ([place]) 10/23/2014   Chest discomfort 10/12/2018   Chronic ischemic vertebrobasilar artery thalamic stroke    Depression    Essential hypertension 05/02/2015   Former smoker 01/24/2015   H/O noncompliance with medical treatment, presenting hazards to health    Headache 05/01/2015   History of ischemic middle cerebral artery stroke embolic    Hyperlipidemia    Hypertension    Hypertensive heart disease    Migraine 05/21/2018   Migraines    Nonrheumatic aortic (valve) insufficiency    Osteoarthritis    Overweight (BMI 25.0-29.9)    Palpitations 10/12/2018   Sleep apnea    Snoring 05/02/2015   Stroke ([place]) 10/25/2016   Syncope and  collapse 10/07/2018    MEDICATIONS: Current Outpatient Medications on File Prior to Visit  Medication Sig Dispense Refill   amLODipine (NORVASC) 10 MG tablet TAKE 1 TABLET BY MOUTH EVERY DAY FOR BLOOD PRESSURE 90 tablet 0   atorvastatin (LIPITOR) 80 MG tablet TAKE 1 TABLET BY MOUTH EVERY DAY AT 6PM FOR CHOLESTEROL 90 tablet 1   busPIRone (BUSPAR) 10 MG tablet Take 1 tablet (10 mg total) by mouth 3 (three) times daily. 90 tablet 3   clopidogrel (PLAVIX) 75 MG tablet Take 1 tablet (75 mg total) by mouth daily. 90 tablet 1   diazepam (VALIUM) 5 MG tablet  Please take 30 minutes prior to procedure. May repeat x 1 (Patient not taking: Reported on 10/10/2021) 2 tablet 0   Elastic Bandages & Supports (POST-OP SHOE/SOFT TOP WOMEN) MISC 1 each by Does not apply route daily as needed. 1 each 0   FLUoxetine (PROZAC) 10 MG tablet Take 10 mg by mouth daily.     flurbiprofen (ANSAID) 100 MG tablet TAKE 1 TABLET BY MOUTH EVERY 8 HOURS AS NEEDED (NOT MORE THAN 3 TABLETS A DAY) 30 tablet 5   Galcanezumab-gnlm 120 MG/ML SOAJ Inject 120 mg into the skin every 30 (thirty) days. 1.12 mL 0   hydrALAZINE (APRESOLINE) 50 MG tablet Take 1 tablet (50 mg total) by mouth 3 (three) times daily. 270 tablet 0   hydrochlorothiazide (MICROZIDE) 12.5 MG capsule Take 1 capsule (12.5 mg total) by mouth daily. 90 capsule 1   HYDROcodone-acetaminophen (NORCO/VICODIN) 5-325 MG tablet Take 1 tablet by mouth every 8 (eight) hours as needed. 15 tablet 0   hydrOXYzine (ATARAX/VISTARIL) 50 MG tablet Take 50 mg by mouth 3 (three) times daily as needed for anxiety or sleep.     lisinopril (ZESTRIL) 10 MG tablet Take 1 tablet (10 mg total) by mouth daily. 90 tablet 1   methocarbamol (ROBAXIN) 500 MG tablet Take 1 tablet (500 mg total) by mouth 2 (two) times daily. 20 tablet 0   metoCLOPramide (REGLAN) 10 MG tablet Take 10 mg by mouth every 6 (six) hours as needed for nausea or headache.     metoprolol tartrate (LOPRESSOR) 25 MG tablet Take 1 tablet (25 mg total) by mouth 2 (two) times daily. 180 tablet 1   Misc. Devices MISC Incontinent supplies 1 each 0   mupirocin ointment (BACTROBAN) 2 % Apply 1 application topically 2 (two) times daily. 100 g 3   nitroGLYCERIN (NITROSTAT) 0.4 MG SL tablet Place 0.4 mg under the tongue every 5 (five) minutes as needed for chest pain. (Patient not taking: Reported on 10/10/2021)     pantoprazole (PROTONIX) 20 MG tablet Take 1 tablet (20 mg total) by mouth daily. 90 tablet 1   spironolactone (ALDACTONE) 25 MG tablet Take 1 tablet (25 mg total) by mouth daily.  90 tablet 1   traZODone (DESYREL) 100 MG tablet Take 1 tablet (100 mg total) by mouth at bedtime. 90 tablet 1   venlafaxine XR (EFFEXOR-XR) 75 MG 24 hr capsule TAKE 3 CAPSULES BY MOUTH EVERY MORNING WITH BREAKFAST 90 capsule 1   Vitamin D, Ergocalciferol, (DRISDOL) 1.25 MG (50000 UNIT) CAPS capsule Take 1 capsule (50,000 Units total) by mouth every 7 (seven) days. Take for 8 total doses(weeks) 8 capsule 0   No current facility-administered medications on file prior to visit.    ALLERGIES: Allergies  Allergen Reactions   Ibuprofen Other (See Comments)    Can not take with current medications .   Tylenol [Acetaminophen] Other (  See Comments)    Pt stated tylenol gives her extreme headache    FAMILY HISTORY: Family History  Problem Relation Age of Onset   Cancer Mother        type unknown   Hypertension Mother    Hypertension Sister    Diabetes Sister    Cancer Maternal Aunt        4 aunts died of cancer types unknown      Objective:  Blood pressure 120/67, pulse (!) 58, height '5\' 6"'$  (1.676 m), weight 201 lb 6.4 oz (91.4 kg), last menstrual period 03/19/2016, SpO2 99 %. General: No acute distress.  Patient appears well-groomed.      Metta Clines, DO  CC: [name], NP

## 2021-12-16 ENCOUNTER — Ambulatory Visit (INDEPENDENT_AMBULATORY_CARE_PROVIDER_SITE_OTHER): Payer: Medicare Other | Admitting: Neurology

## 2021-12-16 ENCOUNTER — Encounter: Payer: Self-pay | Admitting: Neurology

## 2021-12-16 ENCOUNTER — Telehealth: Payer: Self-pay

## 2021-12-16 VITALS — BP 120/67 | HR 58 | Ht 66.0 in | Wt 201.4 lb

## 2021-12-16 DIAGNOSIS — R296 Repeated falls: Secondary | ICD-10-CM | POA: Diagnosis not present

## 2021-12-16 DIAGNOSIS — I69354 Hemiplegia and hemiparesis following cerebral infarction affecting left non-dominant side: Secondary | ICD-10-CM

## 2021-12-16 DIAGNOSIS — R55 Syncope and collapse: Secondary | ICD-10-CM

## 2021-12-16 MED ORDER — FLURBIPROFEN 100 MG PO TABS: ORAL_TABLET | ORAL | 5 refills | Status: DC

## 2021-12-16 MED ORDER — VENLAFAXINE HCL ER 75 MG PO CP24: ORAL_CAPSULE | ORAL | 5 refills | Status: DC

## 2021-12-16 MED ORDER — QULIPTA 60 MG PO TABS: 60.0000 mg | ORAL_TABLET | Freq: Every day | ORAL | 5 refills | Status: DC

## 2021-12-16 NOTE — Patient Instructions (Signed)
Refilled venlafaxine  Refilled flurbiprofen.  Limit use of pain relievers to no more than 2 days out of week to prevent risk of rebound or medication-overuse headache. Stop fluoxetine. Plan is to switch from Emgality to Why '60mg'$  pill once daily.  Continue Emgality until we know you can start it. Follow up 6 months.

## 2021-12-16 NOTE — Telephone Encounter (Signed)
PA needed for Qulipta. Per Dr.Jaffe, patient prefers/ tolerate  not to do inject herself.

## 2021-12-17 ENCOUNTER — Encounter: Payer: Self-pay | Admitting: Nurse Practitioner

## 2021-12-17 ENCOUNTER — Telehealth (HOSPITAL_BASED_OUTPATIENT_CLINIC_OR_DEPARTMENT_OTHER): Payer: Medicare Other | Admitting: Nurse Practitioner

## 2021-12-17 ENCOUNTER — Ambulatory Visit: Payer: Medicare Other | Admitting: Nurse Practitioner

## 2021-12-17 ENCOUNTER — Other Ambulatory Visit: Payer: Self-pay | Admitting: Nurse Practitioner

## 2021-12-17 DIAGNOSIS — M79641 Pain in right hand: Secondary | ICD-10-CM | POA: Diagnosis not present

## 2021-12-17 DIAGNOSIS — I10 Essential (primary) hypertension: Secondary | ICD-10-CM

## 2021-12-17 DIAGNOSIS — G8929 Other chronic pain: Secondary | ICD-10-CM

## 2021-12-17 MED ORDER — HYDRALAZINE HCL 50 MG PO TABS: 50.0000 mg | ORAL_TABLET | Freq: Three times a day (TID) | ORAL | 1 refills | Status: DC

## 2021-12-17 NOTE — Progress Notes (Signed)
Virtual Visit via Telephone Note  I discussed the limitations, risks, security and privacy concerns of performing an evaluation and management service by telephone and the availability of in person appointments. I also discussed with the patient that there may be a patient responsible charge related to this service. The patient expressed understanding and agreed to proceed.    I connected with Brandy Roberts on 12/17/21  at   9:30 AM EDT  EDT by telephone and verified that I am speaking with the correct person using two identifiers.  Location of Patient: Private Residence   Location of Provider: [place] [facility] [employer] Office    Persons participating in Telemedicine visit: [name] FNP-BC Brandy Roberts    History of Present Illness: Telemedicine visit for: chronic right hand pain She has a past medical history of Anemia, Anxiety, Aortic regurgitation, Cardiomyopathy due to hypertension (10/23/2014), Chronic ischemic vertebrobasilar artery thalamic stroke, Depression, Essential hypertension (05/02/2015), Former smoker (01/24/2015), H/O noncompliance with medical treatment, presenting hazards to health, Headache (05/01/2015), History of ischemic middle cerebral artery stroke embolic, Hyperlipidemia, Hypertension, Hypertensive heart disease, Migraine (05/21/2018), Migraines, Nonrheumatic aortic (valve) insufficiency, Osteoarthritis, Overweight (BMI 25.0-29.9), Palpitations (10/12/2018), Sleep apnea, Snoring (05/02/2015), Stroke (10/25/2016), and Syncope and collapse (10/07/2018).   Patient presents for presents evaluation of pain in right hand.  Onset of the symptoms was several months ago. Current symptoms include  sharp pain radiating throughout the hand with numbness occurring at night that wakes her up out of her sleep . Inciting event/aggravating factors: none known Patient's course of YO:VZCHYIFOY worsening. Evaluation to date: none.  Treatment to date:  patient is already  prescribed pain medication for back pain  .     Past Medical History:  Diagnosis Date   Anemia    Anxiety    Aortic regurgitation    Cardiomyopathy due to hypertension ([place]) 10/23/2014   Chest discomfort 10/12/2018   Chronic ischemic vertebrobasilar artery thalamic stroke    Depression    Essential hypertension 05/02/2015   Former smoker 01/24/2015   H/O noncompliance with medical treatment, presenting hazards to health    Headache 05/01/2015   History of ischemic middle cerebral artery stroke embolic    Hyperlipidemia    Hypertension    Hypertensive heart disease    Migraine 05/21/2018   Migraines    Nonrheumatic aortic (valve) insufficiency    Osteoarthritis    Overweight (BMI 25.0-29.9)    Palpitations 10/12/2018   Sleep apnea    Snoring 05/02/2015   Stroke ([place]) 10/25/2016   Syncope and collapse 10/07/2018    Past Surgical History:  Procedure Laterality Date   CESAREAN SECTION     GANGLION CYST EXCISION     TEE WITHOUT CARDIOVERSION N/A 10/09/2014   Procedure: TRANSESOPHAGEAL ECHOCARDIOGRAM (TEE);  Surgeon: Sanda Klein, MD;  Location: [facility] ENDOSCOPY;  Service: Cardiovascular;  Laterality: N/A;   TONSILLECTOMY      Family History  Problem Relation Age of Onset   Cancer Mother        type unknown   Hypertension Mother    Hypertension Sister    Diabetes Sister    Cancer Maternal Aunt        4 aunts died of cancer types unknown    Social History   Socioeconomic History   Marital status: Single    Spouse name: Not on file   Number of children: 2   Years of education: 12   Highest education level: High school graduate  Occupational History   Occupation: disable  Tobacco Use   Smoking status: Former    Packs/day: 0.25    Types: Cigarettes    Quit date: 08/31/2017    Years since quitting: 4.2   Smokeless tobacco: Never  Vaping Use   Vaping Use: Never used  Substance and Sexual Activity   Alcohol use: No    Alcohol/week: 0.0 standard drinks of alcohol   Drug  use: Yes    Types: Marijuana   Sexual activity: Not on file  Other Topics Concern   Not on file  Social History Narrative   Patient lives with her uncle in a one story home.  Has 2 sons.  Currently on disability.  Education: high school.   Drinks 1-2 sodas a week       Social Determinants of Radio broadcast assistant Strain: Not on file  Food Insecurity: Not on file  Transportation Needs: Not on file  Physical Activity: Not on file  Stress: Not on file  Social Connections: Not on file     Observations/Objective: Awake, alert and oriented x 3   Review of Systems  Constitutional:  Negative for fever, malaise/fatigue and weight loss.  HENT: Negative.  Negative for nosebleeds.   Eyes: Negative.  Negative for blurred vision, double vision and photophobia.  Respiratory: Negative.  Negative for cough and shortness of breath.   Cardiovascular: Negative.  Negative for chest pain, palpitations and leg swelling.  Gastrointestinal: Negative.  Negative for heartburn, nausea and vomiting.  Musculoskeletal:  Positive for joint pain. Negative for myalgias.  Neurological: Negative.  Negative for dizziness, focal weakness, seizures and headaches.  Psychiatric/Behavioral: Negative.  Negative for suicidal ideas.     Assessment and Plan: Diagnoses and all orders for this visit:  Chronic hand pain, right Referred to hand specialist.  Xray ordered today    Follow Up Instructions Return if symptoms worsen or fail to improve.     I discussed the assessment and treatment plan with the patient. The patient was provided an opportunity to ask questions and all were answered. The patient agreed with the plan and demonstrated an understanding of the instructions.   The patient was advised to call back or seek an in-person evaluation if the symptoms worsen or if the condition fails to improve as anticipated.  I provided 11 minutes of non-face-to-face time during this encounter including median  intraservice time, reviewing previous notes, labs, imaging, medications and explaining diagnosis and management.  [name], FNP-BC

## 2021-12-18 ENCOUNTER — Telehealth (HOSPITAL_COMMUNITY): Payer: Self-pay | Admitting: Pharmacy Technician

## 2021-12-18 NOTE — Telephone Encounter (Signed)
Patient Advocate Encounter   Received notification that prior authorization for Qulipta '60MG'$  tablets is required.   PA submitted on 12/18/2021 Key [id number] Status is pending       Lyndel Safe, [place] Patient Advocate Specialist [place] Patient Advocate Team Direct Number: [phone]  Fax: [phone]

## 2021-12-27 ENCOUNTER — Ambulatory Visit (INDEPENDENT_AMBULATORY_CARE_PROVIDER_SITE_OTHER): Payer: Medicare Other | Admitting: Orthopaedic Surgery

## 2021-12-27 ENCOUNTER — Ambulatory Visit (INDEPENDENT_AMBULATORY_CARE_PROVIDER_SITE_OTHER): Payer: Medicare Other

## 2021-12-27 ENCOUNTER — Encounter: Payer: Self-pay | Admitting: Orthopaedic Surgery

## 2021-12-27 DIAGNOSIS — M79641 Pain in right hand: Secondary | ICD-10-CM | POA: Diagnosis not present

## 2021-12-27 NOTE — Progress Notes (Signed)
Office Visit Note   Patient: Brandy Roberts           Date of Birth: 14-Oct-1970           MRN: [id number] Visit Date: 12/27/2021              Requested by: [name], NP [place],  [place] [address] PCP: [name], NP   Assessment & Plan: Visit Diagnoses:  1. Pain in right hand     Plan: Impression is right hand osteoarthritis.  There is evidence of arthritis in the IP joints with some spurring.  Patient cannot take NSAIDs because she is on Plavix.  I recommended Voltaren gel to use.  No real surgical options for her condition.  We will see her back as needed.  Follow-Up Instructions: No follow-ups on file.   Orders:  Orders Placed This Encounter  Procedures  . XR Hand Complete Right   No orders of the defined types were placed in this encounter.     Procedures: No procedures performed   Clinical Data: No additional findings.   Subjective: Chief Complaint  Patient presents with  . Right Hand - Pain    HPI Brandy Roberts a 51 year old female here for evaluation of chronic severe right hand pain.  Pain is to the entire hand.  Denies any numbness and tingling.  Describes the pain as a sharp stabbing pain that is worse at night.  Review of Systems  Constitutional: Negative.   HENT: Negative.    Eyes: Negative.   Respiratory: Negative.    Cardiovascular: Negative.   Endocrine: Negative.   Musculoskeletal: Negative.   Neurological: Negative.   Hematological: Negative.   Psychiatric/Behavioral: Negative.    All other systems reviewed and are negative.    Objective: Vital Signs: LMP 03/19/2016   Physical Exam Vitals and nursing note reviewed.  Constitutional:      Appearance: She is well-developed.  HENT:     Head: Atraumatic.     Nose: Nose normal.  Eyes:     Extraocular Movements: Extraocular movements intact.  Cardiovascular:     Pulses: Normal pulses.  Pulmonary:     Effort: Pulmonary effort is normal.   Abdominal:     Palpations: Abdomen is soft.  Musculoskeletal:     Cervical back: Neck supple.  Skin:    General: Skin is warm.     Capillary Refill: Capillary refill takes less than 2 seconds.  Neurological:     Mental Status: She is alert. Mental status is at baseline.  Psychiatric:        Behavior: Behavior normal.        Thought Content: Thought content normal.        Judgment: Judgment normal.    Ortho Exam Examination of right hand shows no swelling masses lesions.  She is able to make a full composite fist.  No nodules.  No tenderness to palpation.  Negative carpal tunnel compressive signs. Specialty Comments:  No specialty comments available.  Imaging: XR Hand Complete Right  Result Date: 12/27/2021 Right hand osteoarthritis    PMFS History: Patient Active Problem List   Diagnosis Date Noted  . Vitamin D deficiency 05/15/2021  . Facet arthropathy 05/15/2021  . SI joint arthritis 05/15/2021  . Lumbar radiculopathy 05/01/2021  . Obesity (BMI 35.0-39.9 without comorbidity) 11/26/2020  . Mixed dyslipidemia 11/26/2020  . Anemia   . Anxiety   . Depression   . Hypertension   . Migraines   .  Nonrheumatic aortic (valve) insufficiency   . Osteoarthritis   . Sleep apnea   . Palpitations 10/12/2018  . Chest discomfort 10/12/2018  . Syncope and collapse 10/07/2018  . Migraine 05/21/2018  . Stroke ([place]) 10/25/2016  . Snoring 05/02/2015  . Essential hypertension 05/02/2015  . Headache 05/01/2015  . Former smoker 01/24/2015  . Cardiomyopathy due to hypertension ([place]) 10/23/2014  . Chronic ischemic vertebrobasilar artery thalamic stroke   . Hypertensive heart disease   . Overweight (BMI 25.0-29.9)   . H/O noncompliance with medical treatment, presenting hazards to health   . Aortic regurgitation   . History of ischemic middle cerebral artery stroke embolic   . Hyperlipidemia    Past Medical History:  Diagnosis Date  . Anemia   . Anxiety   . Aortic  regurgitation   . Cardiomyopathy due to hypertension ([place]) 10/23/2014  . Chest discomfort 10/12/2018  . Chronic ischemic vertebrobasilar artery thalamic stroke   . Depression   . Essential hypertension 05/02/2015  . Former smoker 01/24/2015  . H/O noncompliance with medical treatment, presenting hazards to health   . Headache 05/01/2015  . History of ischemic middle cerebral artery stroke embolic   . Hyperlipidemia   . Hypertension   . Hypertensive heart disease   . Migraine 05/21/2018  . Migraines   . Nonrheumatic aortic (valve) insufficiency   . Osteoarthritis   . Overweight (BMI 25.0-29.9)   . Palpitations 10/12/2018  . Sleep apnea   . Snoring 05/02/2015  . Stroke ([place]) 10/25/2016  . Syncope and collapse 10/07/2018    Family History  Problem Relation Age of Onset  . Cancer Mother        type unknown  . Hypertension Mother   . Hypertension Sister   . Diabetes Sister   . Cancer Maternal Aunt        4 aunts died of cancer types unknown    Past Surgical History:  Procedure Laterality Date  . CESAREAN SECTION    . GANGLION CYST EXCISION    . TEE WITHOUT CARDIOVERSION N/A 10/09/2014   Procedure: TRANSESOPHAGEAL ECHOCARDIOGRAM (TEE);  Surgeon: Sanda Klein, MD;  Location[facility] ENDOSCOPY;  Service: Cardiovascular;  Laterality: N/A;  . TONSILLECTOMY     Social History   Occupational History  . Occupation: disable   Tobacco Use  . Smoking status: Former    Packs/day: 0.25    Types: Cigarettes    Quit date: 08/31/2017    Years since quitting: 4.3  . Smokeless tobacco: Never  Vaping Use  . Vaping Use: Never used  Substance and Sexual Activity  . Alcohol use: No    Alcohol/week: 0.0 standard drinks of alcohol  . Drug use: Yes    Types: Marijuana  . Sexual activity: Not on file

## 2022-01-01 NOTE — Telephone Encounter (Signed)
Patient Advocate Encounter  Prior Authorization for Qulipta '60MG'$  tablets has been approved.    PA# [id number] Effective dates: 12/18/2021 through 06/01/2022      Lyndel Safe, [place] Patient Advocate Specialist [place] Patient Advocate Team Direct Number: [phone]  Fax: [phone]

## 2022-01-06 ENCOUNTER — Other Ambulatory Visit: Payer: Self-pay | Admitting: Nurse Practitioner

## 2022-01-06 DIAGNOSIS — G43709 Chronic migraine without aura, not intractable, without status migrainosus: Secondary | ICD-10-CM

## 2022-01-07 NOTE — Telephone Encounter (Signed)
Requested medication (s) are due for refill today: -  Requested medication (s) are on the active medication list: yes  Last refill:  11/05/21 1.12 ml  Future visit scheduled: yes  Notes to clinic:  med not assigned a protocol   Requested Prescriptions  Pending Prescriptions Disp Refills   EMGALITY 120 MG/ML SOAJ [Pharmacy Med Name: EMGALITY 120 MG/ML SUBQ SOLN ML] 1 mL     Sig: INJECT 120 MG INTO THE SKIN EVERY 30 DAYS AS PHYSICIAN INSTRUCTED     Off-Protocol Failed - 01/06/2022 10:23 AM      Failed - Medication not assigned to a protocol, review manually.      Passed - Valid encounter within last 12 months    Recent Outpatient Visits           3 weeks ago Chronic hand pain, right   [place], [name], NP   2 months ago Hospital discharge follow-up   [place] [name], NP   2 months ago Prediabetes   [place], [place]   6 months ago Primary hypertension   [place], [name], NP   9 months ago Encounter for annual physical exam   [place], [name], NP       Future Appointments             In 1 month [name], NP [place]   In 1 month Revankar, Reita Cliche, MD [facility]

## 2022-01-08 ENCOUNTER — Other Ambulatory Visit: Payer: Self-pay | Admitting: Neurology

## 2022-01-08 DIAGNOSIS — G43709 Chronic migraine without aura, not intractable, without status migrainosus: Secondary | ICD-10-CM

## 2022-01-12 DIAGNOSIS — I639 Cerebral infarction, unspecified: Secondary | ICD-10-CM | POA: Diagnosis not present

## 2022-02-10 ENCOUNTER — Other Ambulatory Visit: Payer: Self-pay | Admitting: Physician Assistant

## 2022-02-10 DIAGNOSIS — Z8673 Personal history of transient ischemic attack (TIA), and cerebral infarction without residual deficits: Secondary | ICD-10-CM

## 2022-02-11 NOTE — Telephone Encounter (Signed)
Filled 90 day supply 01/06/22. Requested Prescriptions  Pending Prescriptions Disp Refills  . clopidogrel (PLAVIX) 75 MG tablet [Pharmacy Med Name: CLOPIDOGREL BISULFATE 75 MG TAB] 90 tablet 1    Sig: TAKE 1 TABLET BY MOUTH EVERY DAY     Hematology: Antiplatelets - clopidogrel Failed - 02/10/2022 11:55 AM      Failed - Cr in normal range and within 360 days    Creat  Date Value Ref Range Status  07/24/2016 1.01 0.50 - 1.10 mg/dL Final   Creatinine, Ser  Date Value Ref Range Status  10/10/2021 1.14 (H) 0.57 - 1.00 mg/dL Final         Passed - HCT in normal range and within 180 days    Hematocrit  Date Value Ref Range Status  10/10/2021 41.1 34.0 - 46.6 % Final         Passed - HGB in normal range and within 180 days    Hemoglobin  Date Value Ref Range Status  10/10/2021 13.4 11.1 - 15.9 g/dL Final         Passed - PLT in normal range and within 180 days    Platelets  Date Value Ref Range Status  10/10/2021 236 150 - 450 x10E3/uL Final         Passed - Valid encounter within last 6 months    Recent Outpatient Visits          1 month ago Chronic hand pain, right   [place], [name], NP   3 months ago Hospital discharge follow-up   [place], NP   4 months ago Prediabetes   [place], [place]   7 months ago Primary hypertension   [place], [name], NP   10 months ago Encounter for annual physical exam   [place], [name], NP      Future Appointments            Tomorrow [name], NP [place]   In 2 weeks Revankar, Reita Cliche, MD [facility] [place]

## 2022-02-12 ENCOUNTER — Ambulatory Visit: Payer: Medicare Other | Admitting: Nurse Practitioner

## 2022-02-25 ENCOUNTER — Telehealth: Payer: Self-pay

## 2022-02-25 ENCOUNTER — Ambulatory Visit: Payer: Medicare Other | Attending: Cardiology | Admitting: Cardiology

## 2022-02-25 ENCOUNTER — Encounter: Payer: Self-pay | Admitting: Cardiology

## 2022-02-25 VITALS — BP 140/78 | HR 76 | Ht 66.0 in | Wt 189.0 lb

## 2022-02-25 DIAGNOSIS — I10 Essential (primary) hypertension: Secondary | ICD-10-CM | POA: Diagnosis not present

## 2022-02-25 DIAGNOSIS — I351 Nonrheumatic aortic (valve) insufficiency: Secondary | ICD-10-CM

## 2022-02-25 DIAGNOSIS — E782 Mixed hyperlipidemia: Secondary | ICD-10-CM

## 2022-02-25 NOTE — Telephone Encounter (Signed)
  Patient Consent for Virtual Visit        Brandy Roberts has provided verbal consent on 02/25/2022 for a virtual visit (video or telephone).   CONSENT FOR VIRTUAL VISIT FOR:  Brandy Roberts  By participating in this virtual visit I agree to the following:  I hereby voluntarily request, consent and authorize [facility] and its employed or contracted physicians, physician assistants, nurse practitioners or other licensed health care professionals (the Practitioner), to provide me with telemedicine health care services (the "Services") as deemed necessary by the treating Practitioner. I acknowledge and consent to receive the Services by the Practitioner via telemedicine. I understand that the telemedicine visit will involve communicating with the Practitioner through live audiovisual communication technology and the disclosure of certain medical information by electronic transmission. I acknowledge that I have been given the opportunity to request an in-person assessment or other available alternative prior to the telemedicine visit and am voluntarily participating in the telemedicine visit.  I understand that I have the right to withhold or withdraw my consent to the use of telemedicine in the course of my care at any time, without affecting my right to future care or treatment, and that the Practitioner or I may terminate the telemedicine visit at any time. I understand that I have the right to inspect all information obtained and/or recorded in the course of the telemedicine visit and may receive copies of available information for a reasonable fee.  I understand that some of the potential risks of receiving the Services via telemedicine include:  Delay or interruption in medical evaluation due to technological equipment failure or disruption; Information transmitted may not be sufficient (e.g. poor resolution of images) to allow for appropriate medical decision making by the  Practitioner; and/or  In rare instances, security protocols could fail, causing a breach of personal health information.  Furthermore, I acknowledge that it is my responsibility to provide information about my medical history, conditions and care that is complete and accurate to the best of my ability. I acknowledge that Practitioner's advice, recommendations, and/or decision may be based on factors not within their control, such as incomplete or inaccurate data provided by me or distortions of diagnostic images or specimens that may result from electronic transmissions. I understand that the practice of medicine is not an exact science and that Practitioner makes no warranties or guarantees regarding treatment outcomes. I acknowledge that a copy of this consent can be made available to me via my patient portal ([place] [facility]), or I can request a printed copy by calling the office of [facility].    I understand that my insurance will be billed for this visit.   I have read or had this consent read to me. I understand the contents of this consent, which adequately explains the benefits and risks of the Services being provided via telemedicine.  I have been provided ample opportunity to ask questions regarding this consent and the Services and have had my questions answered to my satisfaction. I give my informed consent for the services to be provided through the use of telemedicine in my medical care

## 2022-02-25 NOTE — Patient Instructions (Signed)

## 2022-02-25 NOTE — Progress Notes (Signed)
Virtual Visit via Telephone Note   Because of [name]'s co-morbid illnesses, she is at least at moderate risk for complications without adequate follow up.  This format is felt to be most appropriate for this patient at this time.  The patient did not have access to video technology/had technical difficulties with video requiring transitioning to audio format only (telephone).  All issues noted in this document were discussed and addressed.  No physical exam could be performed with this format.  Please refer to the patient's chart for her consent to telehealth for [facility].    Date:  02/25/2022   ID:  Brandy Roberts, DOB Mar 31, 1971, MRN [id number] The patient was identified using 2 identifiers.  Patient Location: Home Provider Location: Home Office   PCP:  [name], NP   [facility] Cardiologist:  Jenean Lindau, MD     Evaluation Performed:  Follow-Up Visit  Chief Complaint: Patient with history of essential hypertension and stroke  History of Present Illness:    Brandy Roberts is a 51 y.o. female with.  Patient has past medical history of essential hypertension, mixed dyslipidemia, mild aortic regurgitation.  She has had history of stroke.  She denies any problems at this time and takes care of activities of daily living.  No chest pain orthopnea or PND.  She is actively trying to lose weight and has been successful.  At the time of my evaluation, the patient is alert awake oriented and in no distress.   Past Medical History:  Diagnosis Date   Anemia    Anxiety    Aortic regurgitation    Cardiomyopathy due to hypertension ([place]) 10/23/2014   Chest discomfort 10/12/2018   Chronic ischemic vertebrobasilar artery thalamic stroke    Depression    Essential hypertension 05/02/2015   Facet arthropathy 05/15/2021   Former smoker 01/24/2015   H/O noncompliance with medical treatment, presenting hazards to health    Headache  05/01/2015   History of ischemic middle cerebral artery stroke embolic    Hyperlipidemia    Hypertension    Hypertensive heart disease    Lumbar radiculopathy 05/01/2021   Migraine 05/21/2018   Migraines    Mixed dyslipidemia 11/26/2020   Nonrheumatic aortic (valve) insufficiency    Obesity (BMI 35.0-39.9 without comorbidity) 11/26/2020   Osteoarthritis    Overweight (BMI 25.0-29.9)    Palpitations 10/12/2018   SI joint arthritis 05/15/2021   Sleep apnea    Snoring 05/02/2015   Stroke ([place]) 10/25/2016   Syncope and collapse 10/07/2018   Vitamin D deficiency 05/15/2021   Past Surgical History:  Procedure Laterality Date   CESAREAN SECTION     GANGLION CYST EXCISION     TEE WITHOUT CARDIOVERSION N/A 10/09/2014   Procedure: TRANSESOPHAGEAL ECHOCARDIOGRAM (TEE);  Surgeon: Sanda Klein, MD;  Location: [facility] ENDOSCOPY;  Service: Cardiovascular;  Laterality: N/A;   TONSILLECTOMY       Current Meds  Medication Sig   amLODipine (NORVASC) 10 MG tablet TAKE 1 TABLET BY MOUTH EVERY DAY FOR BLOOD PRESSURE   Atogepant (QULIPTA) 60 MG TABS Take 60 mg by mouth daily.   atorvastatin (LIPITOR) 80 MG tablet TAKE 1 TABLET BY MOUTH EVERY DAY AT 6PM FOR CHOLESTEROL   busPIRone (BUSPAR) 10 MG tablet Take 1 tablet (10 mg total) by mouth 3 (three) times daily.   clopidogrel (PLAVIX) 75 MG tablet Take 1 tablet (75 mg total) by mouth daily.   Elastic Bandages & Supports (POST-OP SHOE/SOFT TOP WOMEN)  MISC 1 each by Does not apply route daily as needed.   flurbiprofen (ANSAID) 100 MG tablet TAKE 1 TABLET BY MOUTH EVERY 8 HOURS AS NEEDED (NOT MORE THAN 3 TABLETS A DAY)   hydrALAZINE (APRESOLINE) 50 MG tablet Take 1 tablet (50 mg total) by mouth 3 (three) times daily.   hydrochlorothiazide (MICROZIDE) 12.5 MG capsule Take 1 capsule (12.5 mg total) by mouth daily.   HYDROcodone-acetaminophen (NORCO/VICODIN) 5-325 MG tablet Take 1 tablet by mouth every 8 (eight) hours as needed.   hydrOXYzine (ATARAX/VISTARIL) 50  MG tablet Take 50 mg by mouth 3 (three) times daily as needed for anxiety or sleep.   lisinopril (ZESTRIL) 10 MG tablet Take 1 tablet (10 mg total) by mouth daily.   metoCLOPramide (REGLAN) 10 MG tablet Take 10 mg by mouth every 6 (six) hours as needed for nausea or headache.   metoprolol tartrate (LOPRESSOR) 25 MG tablet Take 1 tablet (25 mg total) by mouth 2 (two) times daily.   Misc. Devices MISC Incontinent supplies   pantoprazole (PROTONIX) 20 MG tablet Take 1 tablet (20 mg total) by mouth daily.   spironolactone (ALDACTONE) 25 MG tablet Take 1 tablet (25 mg total) by mouth daily.   traZODone (DESYREL) 100 MG tablet Take 1 tablet (100 mg total) by mouth at bedtime.   venlafaxine XR (EFFEXOR-XR) 75 MG 24 hr capsule TAKE 3 CAPSULES BY MOUTH EVERY MORNING WITH BREAKFAST   Vitamin D, Ergocalciferol, (DRISDOL) 1.25 MG (50000 UNIT) CAPS capsule Take 1 capsule (50,000 Units total) by mouth every 7 (seven) days. Take for 8 total doses(weeks)     Allergies:   Ibuprofen and Tylenol [acetaminophen]   Social History   Tobacco Use   Smoking status: Former    Packs/day: 0.25    Types: Cigarettes    Quit date: 08/31/2017    Years since quitting: 4.4   Smokeless tobacco: Never  Vaping Use   Vaping Use: Never used  Substance Use Topics   Alcohol use: No    Alcohol/week: 0.0 standard drinks of alcohol   Drug use: Yes    Types: Marijuana     Family Hx: The patient's family history includes Cancer in her maternal aunt and mother; Diabetes in her sister; Hypertension in her mother and sister.  ROS:   Please see the history of present illness.    Patient denies any chest pain orthopnea or PND All other systems reviewed and are negative.   Prior CV studies:   The following studies were reviewed today:  Echocardiogram was reviewed revealed ejection fraction of 60 to 65% impaired left ventricular laxation and mild aortic regurgitation.  Labs/Other Tests and Data Reviewed:    EKG:  An ECG  dated 921 was personally reviewed today and demonstrated:  Sinus rhythm and nonspecific ST-T changes  Recent Labs: 10/10/2021: ALT 34; BUN 18; Creatinine, Ser 1.14; Hemoglobin 13.4; Platelets 236; Potassium 4.0; Sodium 141; TSH 1.310   Recent Lipid Panel Lab Results  Component Value Date/Time   CHOL 137 10/10/2021 09:54 AM   TRIG 88 10/10/2021 09:54 AM   HDL 42 10/10/2021 09:54 AM   CHOLHDL 3.3 10/10/2021 09:54 AM   CHOLHDL 3.3 10/25/2016 02:48 AM   LDLCALC 78 10/10/2021 09:54 AM    Wt Readings from Last 3 Encounters:  02/25/22 189 lb (85.7 kg)  12/16/21 201 lb 6.4 oz (91.4 kg)  10/30/21 211 lb (95.7 kg)     Risk Assessment/Calculations:          Objective:  Vital Signs:  Ht '5\' 6"'$  (1.676 m)   Wt 189 lb (85.7 kg)   LMP 03/19/2016   BMI 30.51 kg/m    VITAL SIGNS:  reviewed She gave me her blood pressure and heart rate  ASSESSMENT & PLAN:    Essential hypertension: Blood pressure stable and diet was emphasized.  Lifestyle modification urged.  Salt intake issues were discussed.  Importance of regular exercise stressed.  She was advised to walk at least half an hour a day 5 days a week and she promises to do so. Mixed lipidemia: On lipid-lowering medications and followed by primary care.  Last lipids were unremarkable. Elevated blood sugar: I questioned her about this.  She will be in touch with her primary care about this.  Diet was emphasized and she promises to do better. Mild aortic regurgitation: Stable at this time and continue to monitor.            Time:   Today, I have spent 25 minutes with the patient with telehealth technology discussing the above problems.     Medication Adjustments/Labs and Tests Ordered: Current medicines are reviewed at length with the patient today.  Concerns regarding medicines are outlined above.   Tests Ordered: No orders of the defined types were placed in this encounter.   Medication Changes: No orders of the defined  types were placed in this encounter.   Follow Up:  In Person in 47 month(s)  Signed, Jenean Lindau, MD  02/25/2022 9:50 AM    [place]

## 2022-02-28 DIAGNOSIS — H209 Unspecified iridocyclitis: Secondary | ICD-10-CM | POA: Diagnosis not present

## 2022-02-28 DIAGNOSIS — H538 Other visual disturbances: Secondary | ICD-10-CM | POA: Diagnosis not present

## 2022-03-08 DIAGNOSIS — I639 Cerebral infarction, unspecified: Secondary | ICD-10-CM | POA: Diagnosis not present

## 2022-03-10 ENCOUNTER — Ambulatory Visit: Payer: Medicare Other | Admitting: Nurse Practitioner

## 2022-03-19 DIAGNOSIS — H538 Other visual disturbances: Secondary | ICD-10-CM | POA: Diagnosis not present

## 2022-03-19 DIAGNOSIS — I639 Cerebral infarction, unspecified: Secondary | ICD-10-CM | POA: Diagnosis not present

## 2022-04-02 ENCOUNTER — Ambulatory Visit: Payer: Medicare Other | Admitting: Nurse Practitioner

## 2022-04-09 ENCOUNTER — Encounter: Payer: Self-pay | Admitting: Nurse Practitioner

## 2022-04-09 ENCOUNTER — Ambulatory Visit: Payer: Medicare Other | Attending: Nurse Practitioner | Admitting: Nurse Practitioner

## 2022-04-09 VITALS — BP 113/67 | HR 62 | Temp 98.1°F | Wt 179.9 lb

## 2022-04-09 DIAGNOSIS — I10 Essential (primary) hypertension: Secondary | ICD-10-CM | POA: Diagnosis not present

## 2022-04-09 DIAGNOSIS — I119 Hypertensive heart disease without heart failure: Secondary | ICD-10-CM | POA: Diagnosis not present

## 2022-04-09 DIAGNOSIS — R7303 Prediabetes: Secondary | ICD-10-CM

## 2022-04-09 DIAGNOSIS — Z8673 Personal history of transient ischemic attack (TIA), and cerebral infarction without residual deficits: Secondary | ICD-10-CM

## 2022-04-09 DIAGNOSIS — E1165 Type 2 diabetes mellitus with hyperglycemia: Secondary | ICD-10-CM | POA: Diagnosis not present

## 2022-04-09 MED ORDER — CLOPIDOGREL BISULFATE 75 MG PO TABS: 75.0000 mg | ORAL_TABLET | Freq: Every day | ORAL | 3 refills | Status: DC

## 2022-04-09 NOTE — Progress Notes (Signed)
Assessment & Plan:  [name] was seen today for hypertension.  Diagnoses and all orders for this visit:  Essential hypertension -     CMP14+EGFR Continue all antihypertensives as prescribed.  Reminded to bring in blood pressure log for follow  up appointment.  RECOMMENDATIONS: DASH/Mediterranean Diets are healthier choices for HTN.    DM 2 -     Hemoglobin A1c Continue blood sugar control as discussed in office today, low carbohydrate diet, and regular physical exercise as tolerated, 150 minutes per week (30 min each day, 5 days per week, or 50 min 3 days per week). Keep blood sugar logs with fasting goal of 90-130 mg/dl, post prandial (after you eat) less than 180.  For Hypoglycemia: BS <60 and Hyperglycemia BS >400; contact the clinic ASAP. Annual eye exams and foot exams are recommended.   History of ischemic middle cerebral artery stroke embolic -     clopidogrel (PLAVIX) 75 MG tablet; Take 1 tablet (75 mg total) by mouth daily.  Hypertensive heart disease without heart failure -     CMP14+EGFR    Patient has been counseled on age-appropriate routine health concerns for screening and prevention. These are reviewed and up-to-date. Referrals have been placed accordingly. Immunizations are up-to-date or declined.    Subjective:   Chief Complaint  Patient presents with   Hypertension   HPI Brandy Roberts 51 y.o. female presents to office today for follow up to HTN  She has a past medical history of Anemia, Anxiety, Aortic regurgitation, Cardiomyopathy due to hypertension (10/23/2014), Chronic ischemic vertebrobasilar artery thalamic stroke, Depression, Essential hypertension (05/02/2015), Former smoker (01/24/2015), H/O noncompliance with medical treatment, presenting hazards to health, Headache (05/01/2015), History of ischemic middle cerebral artery stroke embolic, Hyperlipidemia, Hypertension, Hypertensive heart disease, Migraine (05/21/2018), Migraines, Nonrheumatic aortic  (valve) insufficiency, Osteoarthritis, Overweight (BMI 25.0-29.9), Palpitations (10/12/2018), Sleep apnea, Snoring (05/02/2015), Stroke (10/25/2016), and Syncope and collapse (10/07/2018).     HTN Doing well today. Blood pressure is well controlled with amlodipine 10 mg daily, hydralazine 50 mg TID, lisinopril 71m daily, lopressor 25 mg BID, and spirinolactone 25 mg daily as prescribed.   BP Readings from Last 3 Encounters:  04/09/22 113/67  02/25/22 (!) 140/78  12/16/21 120/67    DM 2 Well controlled. She declined metformin initially. Controlled with diet at is time Lab Results  Component Value Date   HGBA1C 6.1 (H) 04/09/2022     Review of Systems  Constitutional:  Negative for fever, malaise/fatigue and weight loss.  HENT: Negative.  Negative for nosebleeds.   Eyes: Negative.  Negative for blurred vision, double vision and photophobia.  Respiratory: Negative.  Negative for cough and shortness of breath.   Cardiovascular: Negative.  Negative for chest pain, palpitations and leg swelling.  Gastrointestinal: Negative.  Negative for heartburn, nausea and vomiting.  Musculoskeletal: Negative.  Negative for myalgias.  Neurological:  Positive for weakness. Negative for dizziness, focal weakness, seizures and headaches.  Psychiatric/Behavioral: Negative.  Negative for suicidal ideas.     Past Medical History:  Diagnosis Date   Anemia    Anxiety    Aortic regurgitation    [place]   Chest discomfort 10/12/2018   Chronic ischemic vertebrobasilar artery thalamic stroke    Depression    Essential hypertension 05/02/2015   Facet arthropathy 05/15/2021   Former smoker 01/24/2015   H/O noncompliance with medical treatment, presenting hazards to health    Headache 05/01/2015   History of ischemic middle cerebral artery stroke embolic  Hyperlipidemia    Hypertension    Hypertensive heart disease    Lumbar radiculopathy 05/01/2021   Migraine  05/21/2018   Migraines    Mixed dyslipidemia 11/26/2020   Nonrheumatic aortic (valve) insufficiency    Obesity (BMI 35.0-39.9 without comorbidity) 11/26/2020   Osteoarthritis    Overweight (BMI 25.0-29.9)    Palpitations 10/12/2018   SI joint arthritis 05/15/2021   Sleep apnea    Snoring 05/02/2015   Stroke ([place]) 10/25/2016   Syncope and collapse 10/07/2018   Vitamin D deficiency 05/15/2021    Past Surgical History:  Procedure Laterality Date   CESAREAN SECTION     GANGLION CYST EXCISION     TEE WITHOUT CARDIOVERSION N/A 10/09/2014   Procedure: TRANSESOPHAGEAL ECHOCARDIOGRAM (TEE);  Surgeon: Sanda Klein, MD;  Location[facility] ENDOSCOPY;  Service: Cardiovascular;  Laterality: N/A;   TONSILLECTOMY      Family History  Problem Relation Age of Onset   Cancer Mother        type unknown   Hypertension Mother    Hypertension Sister    Diabetes Sister    Cancer Maternal Aunt        4 aunts died of cancer types unknown    Social History Reviewed with no changes to be made today.   Outpatient Medications Prior to Visit  Medication Sig Dispense Refill   amLODipine (NORVASC) 10 MG tablet TAKE 1 TABLET BY MOUTH EVERY DAY FOR BLOOD PRESSURE 90 tablet 0   Atogepant (QULIPTA) 60 MG TABS Take 60 mg by mouth daily. 30 tablet 5   atorvastatin (LIPITOR) 80 MG tablet TAKE 1 TABLET BY MOUTH EVERY DAY AT 6PM FOR CHOLESTEROL 90 tablet 1   busPIRone (BUSPAR) 10 MG tablet Take 1 tablet (10 mg total) by mouth 3 (three) times daily. 90 tablet 3   diazepam (VALIUM) 5 MG tablet Please take 30 minutes prior to procedure. May repeat x 1 2 tablet 0   Elastic Bandages & Supports (POST-OP SHOE/SOFT TOP WOMEN) MISC 1 each by Does not apply route daily as needed. 1 each 0   flurbiprofen (ANSAID) 100 MG tablet TAKE 1 TABLET BY MOUTH EVERY 8 HOURS AS NEEDED (NOT MORE THAN 3 TABLETS A DAY) 30 tablet 5   Galcanezumab-gnlm 120 MG/ML SOAJ Inject 120 mg into the skin every 30 (thirty) days. 1.12 mL 0   hydrALAZINE  (APRESOLINE) 50 MG tablet Take 1 tablet (50 mg total) by mouth 3 (three) times daily. 270 tablet 1   hydrochlorothiazide (MICROZIDE) 12.5 MG capsule Take 1 capsule (12.5 mg total) by mouth daily. 90 capsule 1   HYDROcodone-acetaminophen (NORCO/VICODIN) 5-325 MG tablet Take 1 tablet by mouth every 8 (eight) hours as needed. 15 tablet 0   hydrOXYzine (ATARAX/VISTARIL) 50 MG tablet Take 50 mg by mouth 3 (three) times daily as needed for anxiety or sleep.     lisinopril (ZESTRIL) 10 MG tablet Take 1 tablet (10 mg total) by mouth daily. 90 tablet 1   methocarbamol (ROBAXIN) 500 MG tablet Take 1 tablet (500 mg total) by mouth 2 (two) times daily. 20 tablet 0   metoCLOPramide (REGLAN) 10 MG tablet Take 10 mg by mouth every 6 (six) hours as needed for nausea or headache.     metoprolol tartrate (LOPRESSOR) 25 MG tablet Take 1 tablet (25 mg total) by mouth 2 (two) times daily. 180 tablet 1   Misc. Devices MISC Incontinent supplies 1 each 0   mupirocin ointment (BACTROBAN) 2 % Apply 1 application topically 2 (two) times  daily. 100 g 3   nitroGLYCERIN (NITROSTAT) 0.4 MG SL tablet Place 0.4 mg under the tongue every 5 (five) minutes as needed for chest pain.     pantoprazole (PROTONIX) 20 MG tablet Take 1 tablet (20 mg total) by mouth daily. 90 tablet 1   spironolactone (ALDACTONE) 25 MG tablet Take 1 tablet (25 mg total) by mouth daily. 90 tablet 1   traZODone (DESYREL) 100 MG tablet Take 1 tablet (100 mg total) by mouth at bedtime. 90 tablet 1   venlafaxine XR (EFFEXOR-XR) 75 MG 24 hr capsule TAKE 3 CAPSULES BY MOUTH EVERY MORNING WITH BREAKFAST 90 capsule 5   Vitamin D, Ergocalciferol, (DRISDOL) 1.25 MG (50000 UNIT) CAPS capsule Take 1 capsule (50,000 Units total) by mouth every 7 (seven) days. Take for 8 total doses(weeks) 8 capsule 0   clopidogrel (PLAVIX) 75 MG tablet Take 1 tablet (75 mg total) by mouth daily. (Patient not taking: Reported on 04/09/2022) 90 tablet 1   No facility-administered medications  prior to visit.    Allergies  Allergen Reactions   Ibuprofen Other (See Comments)    Can not take with current medications .   Tylenol [Acetaminophen] Other (See Comments)    Pt stated tylenol gives her extreme headache       Objective:    BP 113/67   Pulse 62   Temp 98.1 F (36.7 C) (Temporal)   Wt 179 lb 14.4 oz (81.6 kg)   LMP 03/19/2016   SpO2 98%   BMI 29.04 kg/m  Wt Readings from Last 3 Encounters:  04/09/22 179 lb 14.4 oz (81.6 kg)  02/25/22 189 lb (85.7 kg)  12/16/21 201 lb 6.4 oz (91.4 kg)    Physical Exam Vitals and nursing note reviewed.  Constitutional:      Appearance: She is well-developed.  HENT:     Head: Normocephalic and atraumatic.  Cardiovascular:     Rate and Rhythm: Normal rate and regular rhythm.     Heart sounds: Normal heart sounds. No murmur heard.    No friction rub. No gallop.  Pulmonary:     Effort: Pulmonary effort is normal. No tachypnea or respiratory distress.     Breath sounds: Normal breath sounds. No decreased breath sounds, wheezing, rhonchi or rales.  Chest:     Chest wall: No tenderness.  Abdominal:     General: Bowel sounds are normal.     Palpations: Abdomen is soft.  Musculoskeletal:        General: Normal range of motion.     Cervical back: Normal range of motion.  Skin:    General: Skin is warm and dry.  Neurological:     Mental Status: She is alert and oriented to person, place, and time.     Motor: Weakness present.     Coordination: Coordination normal.     Gait: Gait abnormal.     Comments: Not using her walker or cane today. She does have impaired balance and I have requested that she come to her office visits with her assistive device in the future.   Psychiatric:        Behavior: Behavior normal. Behavior is cooperative.        Thought Content: Thought content normal.        Judgment: Judgment normal.          Patient has been counseled extensively about nutrition and exercise as well as the  importance of adherence with medications and regular follow-up. The patient was given clear  instructions to go to ER or return to medical center if symptoms don't improve, worsen or new problems develop. The patient verbalized understanding.   Follow-up: Return in about 3 months (around 07/10/2022).   [name], FNP-BC [facility] and [place], [place]   04/13/2022, 10:19 PM

## 2022-04-10 LAB — HEMOGLOBIN A1C
Est. average glucose Bld gHb Est-mCnc: 128 mg/dL
Hgb A1c MFr Bld: 6.1 % — ABNORMAL HIGH (ref 4.8–5.6)

## 2022-04-10 LAB — CMP14+EGFR
ALT: 25 IU/L (ref 0–32)
AST: 18 IU/L (ref 0–40)
Albumin/Globulin Ratio: 1.5 (ref 1.2–2.2)
Albumin: 4.4 g/dL (ref 3.8–4.9)
Alkaline Phosphatase: 116 IU/L (ref 44–121)
BUN/Creatinine Ratio: 18 (ref 9–23)
BUN: 22 mg/dL (ref 6–24)
Bilirubin Total: 0.4 mg/dL (ref 0.0–1.2)
CO2: 24 mmol/L (ref 20–29)
Calcium: 10.1 mg/dL (ref 8.7–10.2)
Chloride: 98 mmol/L (ref 96–106)
Creatinine, Ser: 1.23 mg/dL — ABNORMAL HIGH (ref 0.57–1.00)
Globulin, Total: 3 g/dL (ref 1.5–4.5)
Glucose: 115 mg/dL — ABNORMAL HIGH (ref 70–99)
Potassium: 3.6 mmol/L (ref 3.5–5.2)
Sodium: 137 mmol/L (ref 134–144)
Total Protein: 7.4 g/dL (ref 6.0–8.5)
eGFR: 53 mL/min/{1.73_m2} — ABNORMAL LOW (ref >59)

## 2022-04-13 ENCOUNTER — Encounter: Payer: Self-pay | Admitting: Nurse Practitioner

## 2022-04-21 ENCOUNTER — Other Ambulatory Visit: Payer: Self-pay | Admitting: Physician Assistant

## 2022-04-21 DIAGNOSIS — E785 Hyperlipidemia, unspecified: Secondary | ICD-10-CM

## 2022-04-21 DIAGNOSIS — I10 Essential (primary) hypertension: Secondary | ICD-10-CM

## 2022-05-02 ENCOUNTER — Telehealth: Payer: Self-pay | Admitting: Nurse Practitioner

## 2022-05-02 NOTE — Telephone Encounter (Signed)
Made two attempts to call patient and reschedule AWV.  Phone still states ' call cannot be completed as dialed, check number and try call again."   Does she have another phone number?

## 2022-05-02 NOTE — Telephone Encounter (Signed)
Pt is calling to reschedule her AWV Please advise CB- [phone]

## 2022-05-13 ENCOUNTER — Telehealth: Payer: Self-pay | Admitting: Emergency Medicine

## 2022-05-13 NOTE — Telephone Encounter (Signed)
Will faxed when received and signed.

## 2022-05-13 NOTE — Telephone Encounter (Signed)
Copied from [place] [id number]. Topic: General - Other >> May 13, 2022 10:43 AM [place] B wrote: Reason for CRM: Minette Brine from [facility] called in about request sent over for incontinent supplies on Dec 4. Please call back with status. They will also refax today. There fax # is [phone]

## 2022-05-15 DIAGNOSIS — I639 Cerebral infarction, unspecified: Secondary | ICD-10-CM | POA: Diagnosis not present

## 2022-05-19 ENCOUNTER — Other Ambulatory Visit: Payer: Self-pay | Admitting: Neurology

## 2022-05-19 NOTE — Telephone Encounter (Addendum)
[facility] is calling to follow up on the Certificate of Medical necessity for pt incontinent supplies. Stated faxed twice, on December 4th and 12th.  Please advise.

## 2022-05-19 NOTE — Telephone Encounter (Signed)
Paperwork will be faxed once complete.

## 2022-05-27 ENCOUNTER — Other Ambulatory Visit (HOSPITAL_COMMUNITY): Payer: Self-pay

## 2022-05-27 ENCOUNTER — Telehealth: Payer: Self-pay

## 2022-05-27 NOTE — Telephone Encounter (Unsigned)
Pt called back to follow up on a phone call she received.

## 2022-05-27 NOTE — Telephone Encounter (Signed)
Pharmacy Patient Advocate Encounter   Received notification from CoverMyMeds that prior authorization for Qulipta '60MG'$  tablets Form is required/requested.   PA submitted on 05/27/2022 to (ins) [employer] via CoverMyMeds  Key [id number]  Status is pending

## 2022-05-27 NOTE — Telephone Encounter (Signed)
Had to resubmit prior authorization-new Key is: [id number] for Ubrelvy '100MG'$  tablets  Date submitted was 05/27/2022.

## 2022-05-29 NOTE — Telephone Encounter (Signed)
Brandy Roberts has been APPROVED from 06/02/2022-06/02/2023 through [employer].  Approval letter had been indexed to patient documents.

## 2022-06-11 ENCOUNTER — Telehealth: Payer: Self-pay | Admitting: Emergency Medicine

## 2022-06-11 NOTE — Telephone Encounter (Signed)
Noted, will fax when signed.

## 2022-06-11 NOTE — Telephone Encounter (Signed)
Copied from [place] [id number]. Topic: General - Other >> Jun 11, 2022  9:24 AM Cyndi Bender wrote: Reason for CRM: Raquel with [facility] called for an update on the fax that was sent on 06/04/22. Cb# [phone]  Fax# [phone]

## 2022-06-17 ENCOUNTER — Other Ambulatory Visit: Payer: Self-pay | Admitting: Family Medicine

## 2022-06-17 ENCOUNTER — Other Ambulatory Visit: Payer: Self-pay | Admitting: Physician Assistant

## 2022-06-17 DIAGNOSIS — I10 Essential (primary) hypertension: Secondary | ICD-10-CM

## 2022-06-17 NOTE — Telephone Encounter (Signed)
Requested Prescriptions  Pending Prescriptions Disp Refills   amLODipine (NORVASC) 10 MG tablet [Pharmacy Med Name: AMLODIPINE BESYLATE 10 MG TAB] 90 tablet 0    Sig: TAKE 1 TABLET BY MOUTH EVERY DAY FOR BLOOD PRESSURE     Cardiovascular: Calcium Channel Blockers 2 Passed - 06/17/2022 11:57 AM      Passed - Last BP in normal range    BP Readings from Last 1 Encounters:  04/09/22 113/67         Passed - Last Heart Rate in normal range    Pulse Readings from Last 1 Encounters:  04/09/22 62         Passed - Valid encounter within last 6 months    Recent Outpatient Visits           2 months ago Essential hypertension   [place], [name], NP   6 months ago Chronic hand pain, right   [place], [name], NP   7 months ago Hospital discharge follow-up   [place], NP   8 months ago Prediabetes   [place], [place]   12 months ago Primary hypertension   [place], [name], NP       Future Appointments             In 3 weeks [name], NP [place]

## 2022-06-17 NOTE — Telephone Encounter (Signed)
Requested Prescriptions  Pending Prescriptions Disp Refills   hydrochlorothiazide (MICROZIDE) 12.5 MG capsule [Pharmacy Med Name: HYDROCHLOROTHIAZIDE 12.5 MG CAP] 90 capsule 0    Sig: TAKE 1 CAPSULE BY MOUTH EVERY DAY     Cardiovascular: Diuretics - Thiazide Failed - 06/17/2022 11:56 AM      Failed - Cr in normal range and within 180 days    Creat  Date Value Ref Range Status  07/24/2016 1.01 0.50 - 1.10 mg/dL Final   Creatinine, Ser  Date Value Ref Range Status  04/09/2022 1.23 (H) 0.57 - 1.00 mg/dL Final         Passed - K in normal range and within 180 days    Potassium  Date Value Ref Range Status  04/09/2022 3.6 3.5 - 5.2 mmol/L Final         Passed - Na in normal range and within 180 days    Sodium  Date Value Ref Range Status  04/09/2022 137 134 - 144 mmol/L Final         Passed - Last BP in normal range    BP Readings from Last 1 Encounters:  04/09/22 113/67         Passed - Valid encounter within last 6 months    Recent Outpatient Visits           2 months ago Essential hypertension   [place], [name], NP   6 months ago Chronic hand pain, right   [place], [name], NP   7 months ago Hospital discharge follow-up   [place] [name], NP   8 months ago Prediabetes   [place], [place]   12 months ago Primary hypertension   [place], [name], NP       Future Appointments             In 3 weeks [name], NP [place]

## 2022-06-23 ENCOUNTER — Telehealth: Payer: Self-pay | Admitting: Nurse Practitioner

## 2022-06-23 NOTE — Telephone Encounter (Signed)
Pt has called [phone] re these supplies and states she is out and needs today. Pt states if they are not delivered by tomorrow she will be in the office. FU

## 2022-06-23 NOTE — Telephone Encounter (Signed)
Forms have been signed and faxed.  

## 2022-06-23 NOTE — Telephone Encounter (Signed)
Routing to CMA 

## 2022-06-23 NOTE — Progress Notes (Deleted)
NEUROLOGY FOLLOW UP OFFICE NOTE  Brandy Roberts [id number]  Assessment/Plan:   1.  Migraine without aurawithout status migrainosus, not intractable - today with a persistent headache for a week. 2. Recurrent loss of consciousness.  Semiology characteristic of syncope.  May be due to orthostatic hypotension, vasovagal or cardiac source.  The first event occurred in setting of severe migraine, which may have been vasovagal.  MRI of brain and EEG unremarkable. 3.  Frequent falls.  I think this is secondary to her residual left sided weakness.  Encouraged her to use walker instead of cane if needed.       1.  Migraine prophylaxis:  She feels uncomfortable using the injection.  Will try to switch from Emgality to Qulipta 75m dail; Continue venlafaxine XR 2248mdaily (will have her discontinue fluoxetine as to avoid two serotonin reuptake inhibitors) 3.  Migraine rescue:  Flurbiprofen 10037m.  Limit use of pain relievers to no more than 2 days out of week to prevent risk of rebound or medication-overuse headache. 5.  Keep headache diary 6.  Follow up in 6 months.     Subjective:  [name] a 51 35ar old right-handed woman with aortic insufficiency, hypertension, hyperlipidemia and history of stroke who follows up for migraines.   UPDATE: Due to feeling uncomfortable using injections, switched from Emgality to QulBrawley  Intensity: Moderate Duration: 2 to 3 hours Frequency: 2 days a month She doesn't like using the Emgality.  She doesn't like injecting herself.     She did have an intractable migraine on 5/31 requiring ED visit.   Current NSAIDS: Flurbiprofen 100m63mrrent analgesics: None Current triptans: None Current ergotamine: None Current anti-emetic:  Reglan 10 mg Current muscle relaxants: Robaxin Current anti-anxiolytic: None Current sleep aide: trazodone Current Antihypertensive medications: Amlodipine 10 mg, hydralazine, Spironolactone, lisinopril,  Lopressor Current Antidepressant medications: Venlafaxine XR 225 mg, trazodone 100mg5m (insomnia) Current Anticonvulsant medications:Lyrica Current anti-CGRP: Qulipta 60mg 13my Current Vitamins/Herbal/Supplements: None Current Antihistamines/Decongestants: None Other therapy: None Hormone/birth control: None     Caffeine: Coffee, sweet tea, not daily Alcohol: No Smoker: Cigarettes Diet: Hydrates Exercise: No Depression: Yes; Anxiety: Yes Other pain: Chronic right knee pain Sleep hygiene: Sometimes tosses and turns     HISTORY: MIGRAINES Onset: Migraines since she was young, but worse since her stroke in May 2016. Location:  holocephalic Quality:  pounding Initial Intensity:  10/10 Aura:  no Prodrome:  no Postdrome:  no Associated symptoms: Photophobia, phonophobia.  Rarely nausea.  She has not had any new worse headache of her life, waking up from sleep Initial Duration:  2 days (but Fioricet and ibuprofen lowers intensity down to 2-3/10) Initial Frequency:  Once or twice a month Frequency of abortive medication: only as needed Triggers: None Relieving factors:  Laying down.  Butalbital, ibuprofen.  Tramadol helped but she was told not to take it. Activity:  aggravates   Past NSAIDS:  Ibuprofen, naproxen Past analgesics:  Tylenol, Excedrin, Tramadol (effective but GI upset), Fioricet Past abortive triptans:  no Past muscle relaxants:  no Past anti-emetic:  Zofran Past antihypertensive medications:  no Past antidepressant medications:  no Past anticonvulsant medications: topiramate, gabapentin Past CGRP inhibitor:  Emaglity (not comfortable with injections) Past vitamins/Herbal/Supplements:  no Other past therapies:  no   Family history of headache:  Sister.   STROKE:  She had a stroke in May 2016 with left sided weakness.  MRI of brain from 10/06/14 was personally reviewed and revealed small acute lacunar  infarcts in the right thalamus and right corona radiata, as  well as chronic lacunar infarcts in the left hemisphere.  CTA of head and neck revealed diffuse bilateral petrous and cavernous carotid stenosis.  Cardiac source of embolus was not discovered.     She was admitted to [facility] from 10/24/16 to 10/26/16 for stroke-like event.  She suddenly developed left sided weakness and slurred speech with associated headache.  Blood pressure in ED was 120/66.  CT of head was personally reviewed and negative for acute abnormality.  She had refused tPA.  MRI of brain was personally reviewed and revealed no acute stroke or bleed.  MRA of head revealed no significant intracranial stenosis or occlusion.  LDL was 84.  Hgb A1c was 5.7.  As per neurology evaluation, her deficits appeared to be non-organic, with unusual speech pattern and giveaway weakness.  Somatization was suspected.  2D echo from 11/06/16 demonstrated normal LV EF of 65-70% with no cardiac source of emboli.  Atorvastatin was increased from 70m to 837mdaily.  Differential includes TIA vs hemiplegic migraine vs somatization.   SYNCOPE: [facility]/18, [facility] hol[facility].  She reports that when EMS was leading her to the ambulance from her bedroom, she passed out for 20-30 seconds.  She described sensation of lightheadedness, like she was going to pass out, as well as tunnel vision and palpitations.  She was afebrile.  CT of head revealed her old left basal ganglia infarct but no acute abnormality.  She was treated with Reglan and Benadryl and headache reportedly resolved.  On 09/26/18, she was sitting on her barstool when she felt lightheaded again.  She noted palpitations as well.  She got up and started walking to her bedroom when she noted tunnel vision and passed out.  She woke up on the floor and bruised her back and leg.  She thinks she may have been unconsciousness for 20 to 30 minute.  There were no witnesses.  She noted urinary  incontinence but did not bite her tongue.  She did not have a headache.  She denies change in medications over the past week.  She denies fever or illness.  Due to episodes of recurrent syncopal events, she underwent workup.  MRI of brain with and without contrast on 10/11/18 was personally reviewed and demonstrated stable chronic infarcts but no acute intracranial abnormality.  EEG performed on 10/21/18 was normal. No recurrent spells.   FALLS: In 2019, she endorsed increased problems with balance and falls.  She stated that they were associated with headache and worsening of her left-sided weakness.  MRI of the brain without contrast from 11/13/2017 was personally reviewed and demonstrated no new intracranial abnormalities.  PAST MEDICAL HISTORY: Past Medical History:  Diagnosis Date   Anemia    Anxiety    Aortic regurgitation    [place]   Chest discomfort 10/12/2018   Chronic ischemic vertebrobasilar artery thalamic stroke    Depression    Essential hypertension 05/02/2015   Facet arthropathy 05/15/2021   Former smoker 01/24/2015   H/O noncompliance with medical treatment, presenting hazards to health    Headache 05/01/2015   History of ischemic middle cerebral artery stroke embolic    Hyperlipidemia    Hypertension    Hypertensive heart disease    Lumbar radiculopathy 05/01/2021   Migraine 05/21/2018   Migraines    Mixed dyslipidemia 11/26/2020  Nonrheumatic aortic (valve) insufficiency    Obesity (BMI 35.0-39.9 without comorbidity) 11/26/2020   Osteoarthritis    Overweight (BMI 25.0-29.9)    Palpitations 10/12/2018   SI joint arthritis 05/15/2021   Sleep apnea    Snoring 05/02/2015   Stroke ([place]) 10/25/2016   Syncope and collapse 10/07/2018   Vitamin D deficiency 05/15/2021    MEDICATIONS: Current Outpatient Medications on File Prior to Visit  Medication Sig Dispense Refill   amLODipine (NORVASC) 10 MG tablet TAKE 1 TABLET BY MOUTH  EVERY DAY FOR BLOOD PRESSURE 90 tablet 0   Atogepant (QULIPTA) 60 MG TABS TAKE 1 TABLET BY MOUTH EVERY DAY 30 tablet 0   atorvastatin (LIPITOR) 80 MG tablet TAKE 1 TABLET BY MOUTH EVERY DAY AT 6PM FOR CHOLESTEROL 90 tablet 1   busPIRone (BUSPAR) 10 MG tablet Take 1 tablet (10 mg total) by mouth 3 (three) times daily. 90 tablet 3   clopidogrel (PLAVIX) 75 MG tablet Take 1 tablet (75 mg total) by mouth daily. 90 tablet 3   diazepam (VALIUM) 5 MG tablet Please take 30 minutes prior to procedure. May repeat x 1 2 tablet 0   Elastic Bandages & Supports (POST-OP SHOE/SOFT TOP WOMEN) MISC 1 each by Does not apply route daily as needed. 1 each 0   flurbiprofen (ANSAID) 100 MG tablet TAKE 1 TABLET BY MOUTH EVERY 8 HOURS AS NEEDED (NOT MORE THAN 3 TABLETS A DAY) 30 tablet 5   Galcanezumab-gnlm 120 MG/ML SOAJ Inject 120 mg into the skin every 30 (thirty) days. 1.12 mL 0   hydrALAZINE (APRESOLINE) 50 MG tablet Take 1 tablet (50 mg total) by mouth 3 (three) times daily. 270 tablet 1   hydrochlorothiazide (MICROZIDE) 12.5 MG capsule TAKE 1 CAPSULE BY MOUTH EVERY DAY 90 capsule 0   HYDROcodone-acetaminophen (NORCO/VICODIN) 5-325 MG tablet Take 1 tablet by mouth every 8 (eight) hours as needed. 15 tablet 0   hydrOXYzine (ATARAX/VISTARIL) 50 MG tablet Take 50 mg by mouth 3 (three) times daily as needed for anxiety or sleep.     lisinopril (ZESTRIL) 10 MG tablet Take 1 tablet (10 mg total) by mouth daily. 90 tablet 1   methocarbamol (ROBAXIN) 500 MG tablet Take 1 tablet (500 mg total) by mouth 2 (two) times daily. 20 tablet 0   metoCLOPramide (REGLAN) 10 MG tablet Take 10 mg by mouth every 6 (six) hours as needed for nausea or headache.     metoprolol tartrate (LOPRESSOR) 25 MG tablet TAKE 1 TABLET BY MOUTH TWICE DAILY 180 tablet 1   Misc. Devices MISC Incontinent supplies 1 each 0   mupirocin ointment (BACTROBAN) 2 % Apply 1 application topically 2 (two) times daily. 100 g 3   nitroGLYCERIN (NITROSTAT) 0.4 MG SL  tablet Place 0.4 mg under the tongue every 5 (five) minutes as needed for chest pain.     pantoprazole (PROTONIX) 20 MG tablet Take 1 tablet (20 mg total) by mouth daily. 90 tablet 1   spironolactone (ALDACTONE) 25 MG tablet TAKE 1 TABLET BY MOUTH EVERY DAY 90 tablet 1   traZODone (DESYREL) 100 MG tablet Take 1 tablet (100 mg total) by mouth at bedtime. 90 tablet 1   venlafaxine XR (EFFEXOR-XR) 75 MG 24 hr capsule TAKE 3 CAPSULES BY MOUTH EVERY MORNING WITH BREAKFAST 90 capsule 0   Vitamin D, Ergocalciferol, (DRISDOL) 1.25 MG (50000 UNIT) CAPS capsule Take 1 capsule (50,000 Units total) by mouth every 7 (seven) days. Take for 8 total doses(weeks) 8 capsule 0  No current facility-administered medications on file prior to visit.    ALLERGIES: Allergies  Allergen Reactions   Ibuprofen Other (See Comments)    Can not take with current medications .   Tylenol [Acetaminophen] Other (See Comments)    Pt stated tylenol gives her extreme headache    FAMILY HISTORY: Family History  Problem Relation Age of Onset   Cancer Mother        type unknown   Hypertension Mother    Hypertension Sister    Diabetes Sister    Cancer Maternal Aunt        4 aunts died of cancer types unknown      Objective:  *** General: No acute distress.  Patient appears ***-groomed.   Head:  Normocephalic/atraumatic Eyes:  Fundi examined but not visualized Neck: supple, no paraspinal tenderness, full range of motion Heart:  Regular rate and rhythm Lungs:  Clear to auscultation bilaterally Back: No paraspinal tenderness Neurological Exam: alert and oriented to person, place, and time.  Speech fluent and not dysarthric, language intact.  CN II-XII intact. Bulk and tone normal, muscle strength 5/5 throughout.  Sensation to light touch intact.  Deep tendon reflexes 2+ throughout, toes downgoing.  Finger to nose testing intact.  Gait normal, Romberg negative.   Metta Clines, DO  CC: ***

## 2022-06-23 NOTE — Telephone Encounter (Signed)
Copied from [place] [id number]. Topic: General - Other >> Jun 23, 2022 12:00 PM Dominique A wrote: Reason for CRM: Patient states that she missed a phone call from the practice as was returning the call, no notes are showing.  Please call patient back

## 2022-06-23 NOTE — Telephone Encounter (Signed)
Pt returning call

## 2022-06-23 NOTE — Telephone Encounter (Signed)
Noted  

## 2022-06-24 ENCOUNTER — Ambulatory Visit: Payer: 59 | Admitting: Neurology

## 2022-06-24 NOTE — Telephone Encounter (Signed)
Return call unanswered. Patient did however inquire about incontinence supplies today and was told they were signed and faxed 06/23/22

## 2022-06-25 ENCOUNTER — Other Ambulatory Visit: Payer: Self-pay | Admitting: Neurology

## 2022-06-27 DIAGNOSIS — I639 Cerebral infarction, unspecified: Secondary | ICD-10-CM | POA: Diagnosis not present

## 2022-07-11 ENCOUNTER — Ambulatory Visit: Payer: 59 | Attending: Nurse Practitioner | Admitting: Nurse Practitioner

## 2022-07-11 ENCOUNTER — Encounter: Payer: Self-pay | Admitting: Nurse Practitioner

## 2022-07-11 VITALS — BP 126/71 | HR 74 | Ht 66.0 in | Wt 170.2 lb

## 2022-07-11 DIAGNOSIS — E1165 Type 2 diabetes mellitus with hyperglycemia: Secondary | ICD-10-CM

## 2022-07-11 DIAGNOSIS — B9689 Other specified bacterial agents as the cause of diseases classified elsewhere: Secondary | ICD-10-CM

## 2022-07-11 DIAGNOSIS — Z1231 Encounter for screening mammogram for malignant neoplasm of breast: Secondary | ICD-10-CM

## 2022-07-11 DIAGNOSIS — I69354 Hemiplegia and hemiparesis following cerebral infarction affecting left non-dominant side: Secondary | ICD-10-CM

## 2022-07-11 DIAGNOSIS — I119 Hypertensive heart disease without heart failure: Secondary | ICD-10-CM

## 2022-07-11 DIAGNOSIS — Z8673 Personal history of transient ischemic attack (TIA), and cerebral infarction without residual deficits: Secondary | ICD-10-CM | POA: Diagnosis not present

## 2022-07-11 DIAGNOSIS — I10 Essential (primary) hypertension: Secondary | ICD-10-CM | POA: Diagnosis not present

## 2022-07-11 DIAGNOSIS — L089 Local infection of the skin and subcutaneous tissue, unspecified: Secondary | ICD-10-CM

## 2022-07-11 DIAGNOSIS — I43 Cardiomyopathy in diseases classified elsewhere: Secondary | ICD-10-CM | POA: Diagnosis not present

## 2022-07-11 HISTORY — DX: Hemiplegia and hemiparesis following cerebral infarction affecting left non-dominant side: I69.354

## 2022-07-11 MED ORDER — MUPIROCIN 2 % EX OINT: 1.0000 | TOPICAL_OINTMENT | Freq: Two times a day (BID) | CUTANEOUS | 3 refills | Status: DC

## 2022-07-11 NOTE — Progress Notes (Addendum)
Assessment & Plan:  Amaria was seen today for hypertension.  Diagnoses and all orders for this visit:  Primary hypertension -     Basic metabolic panel Continue all antihypertensives as prescribed.  Reminded to bring in blood pressure log for follow  up appointment.  RECOMMENDATIONS: DASH/Mediterranean Diets are healthier choices for HTN.    Type 2 diabetes mellitus with hyperglycemia, without long-term current use of insulin [name] -     Hemoglobin A1c Continue blood sugar control as discussed in office today, low carbohydrate diet, and regular physical exercise as tolerated, 150 minutes per week (30 min each day, 5 days per week, or 50 min 3 days per week). Keep blood sugar logs with fasting goal of 90-130 mg/dl, post prandial (after you eat) less than 180.  For Hypoglycemia: BS <60 and Hyperglycemia BS >400; contact the clinic ASAP. Annual eye exams and foot exams are recommended.   History of ischemic middle cerebral artery stroke -     Lipid panel INSTRUCTIONS: Work on a low fat, heart healthy diet and participate in regular aerobic exercise program by working out at least 150 minutes per week; 5 days a week-30 minutes per day. Avoid red meat/beef/steak,  fried foods. junk foods, sodas, sugary drinks, unhealthy snacking, alcohol and smoking.  Drink at least 80 oz of water per day and monitor your carbohydrate intake daily.    Hemiparesis affecting left side as late effect of cerebrovascular accident Follow-up with neurology as scheduled  Cardiomyopathy due to hypertension, without heart failure  Follow-up with cardiology as scheduled  Incontinence Paperwork completed for incontinence supplies.   Patient has been counseled on age-appropriate routine health concerns for screening and prevention. These are reviewed and up-to-date. Referrals have been placed accordingly. Immunizations are up-to-date or declined.    Subjective:   Chief Complaint  Patient presents with    Hypertension   HPI Brandy Roberts 52 y.o. female presents to office today for follow up to HTN  She has a past medical history of Anemia, Anxiety, Aortic regurgitation, Cardiomyopathy due to hypertension (10/23/2014), Chronic ischemic vertebrobasilar artery thalamic stroke, Depression, Essential hypertension (05/02/2015), Former smoker (01/24/2015), H/O noncompliance with medical treatment, presenting hazards to health, Headache (05/01/2015), History of ischemic middle cerebral artery stroke embolic, Hyperlipidemia, Hypertension, Hypertensive heart disease, Migraine (05/21/2018), Migraines, Nonrheumatic aortic (valve) insufficiency, Osteoarthritis, Overweight (BMI 25.0-29.9), Palpitations (10/12/2018), Sleep apnea, Snoring (05/02/2015), Stroke (10/25/2016), and Syncope and collapse (10/07/2018).       HTN Blood pressure is well controlled with amlodipine 10 mg daily, hydralazine 50 mg TID, lisinopril 10mg  daily, lopressor 25 mg BID, and spirinolactone 25 mg daily as prescribed.   BP Readings from Last 3 Encounters:  07/11/22 126/71  04/09/22 113/67  02/25/22 (!) 140/78    DM 2 A1c at goal with diet control only at this time.  Lab Results  Component Value Date   HGBA1C 6.1 (H) 04/09/2022   LDL near goal with high intensity statin.  Lab Results  Component Value Date   LDLCALC 78 10/10/2021    Incontinence Onset several years ago. She uses adult diapers 24/7.  She leaks urine with bending, coughing, lifting, standing, walking, laughing, sneezing, with urge, with a full bladder, during the night. Patient describes the symptoms as  a blunted sense of urge to urinate, urge to urinate with little or no warning, urine leakage with coughing/heavy physical activity, and urine leaking unpredictably.  Factors associated with symptoms include associated with menopause, decreased sensation in perineum, medications. Evaluation to date  includes UA/CS: normal.Treatment to date includes none.    Review of  Systems  Constitutional:  Negative for fever, malaise/fatigue and weight loss.  HENT: Negative.  Negative for nosebleeds.   Eyes: Negative.  Negative for blurred vision, double vision and photophobia.  Respiratory: Negative.  Negative for cough and shortness of breath.   Cardiovascular: Negative.  Negative for chest pain, palpitations and leg swelling.  Gastrointestinal: Negative.  Negative for heartburn, nausea and vomiting.  Musculoskeletal: Negative.  Negative for myalgias.  Neurological:  Positive for weakness. Negative for dizziness, focal weakness, seizures and headaches.  Psychiatric/Behavioral: Negative.  Negative for suicidal ideas.     Past Medical History:  Diagnosis Date   Anemia    Anxiety    Aortic regurgitation    Cardiomyopathy due to hypertension [name] 10/23/2014   Chest discomfort 10/12/2018   Chronic ischemic vertebrobasilar artery thalamic stroke    Depression    Essential hypertension 05/02/2015   Facet arthropathy 05/15/2021   Former smoker 01/24/2015   H/O noncompliance with medical treatment, presenting hazards to health    Headache 05/01/2015   History of ischemic middle cerebral artery stroke embolic    Hyperlipidemia    Hypertension    Hypertensive heart disease    Lumbar radiculopathy 05/01/2021   Migraine 05/21/2018   Migraines    Mixed dyslipidemia 11/26/2020   Nonrheumatic aortic (valve) insufficiency    Obesity (BMI 35.0-39.9 without comorbidity) 11/26/2020   Osteoarthritis    Overweight (BMI 25.0-29.9)    Palpitations 10/12/2018   SI joint arthritis 05/15/2021   Sleep apnea    Snoring 05/02/2015   Stroke [name] 10/25/2016   Syncope and collapse 10/07/2018   Vitamin D deficiency 05/15/2021    Past Surgical History:  Procedure Laterality Date   CESAREAN SECTION     GANGLION CYST EXCISION     TEE WITHOUT CARDIOVERSION N/A 10/09/2014   Procedure: TRANSESOPHAGEAL ECHOCARDIOGRAM (TEE);  Surgeon: Thurmon Fair, MD;  Location[facility] ENDOSCOPY;  Service:  Cardiovascular;  Laterality: N/A;   TONSILLECTOMY      Family History  Problem Relation Age of Onset   Cancer Mother        type unknown   Hypertension Mother    Hypertension Sister    Diabetes Sister    Cancer Maternal Aunt        4 aunts died of cancer types unknown    Social History Reviewed with no changes to be made today.   Outpatient Medications Prior to Visit  Medication Sig Dispense Refill   amLODipine (NORVASC) 10 MG tablet TAKE 1 TABLET BY MOUTH EVERY DAY FOR BLOOD PRESSURE 90 tablet 0   Atogepant (QULIPTA) 60 MG TABS TAKE 1 TABLET BY MOUTH EVERY DAY 30 tablet 3   atorvastatin (LIPITOR) 80 MG tablet TAKE 1 TABLET BY MOUTH EVERY DAY AT 6PM FOR CHOLESTEROL 90 tablet 1   busPIRone (BUSPAR) 10 MG tablet Take 1 tablet (10 mg total) by mouth 3 (three) times daily. 90 tablet 3   clopidogrel (PLAVIX) 75 MG tablet Take 1 tablet (75 mg total) by mouth daily. 90 tablet 3   flurbiprofen (ANSAID) 100 MG tablet TAKE 1 TABLET BY MOUTH EVERY 8 HOURS AS NEEDED (NOT MORE THAN 3 TABLETS A DAY) 30 tablet 5   Galcanezumab-gnlm 120 MG/ML SOAJ Inject 120 mg into the skin every 30 (thirty) days. 1.12 mL 0   hydrALAZINE (APRESOLINE) 50 MG tablet Take 1 tablet (50 mg total) by mouth 3 (three) times daily. 270 tablet 1  hydrochlorothiazide (MICROZIDE) 12.5 MG capsule TAKE 1 CAPSULE BY MOUTH EVERY DAY 90 capsule 0   HYDROcodone-acetaminophen (NORCO/VICODIN) 5-325 MG tablet Take 1 tablet by mouth every 8 (eight) hours as needed. 15 tablet 0   hydrOXYzine (ATARAX/VISTARIL) 50 MG tablet Take 50 mg by mouth 3 (three) times daily as needed for anxiety or sleep.     methocarbamol (ROBAXIN) 500 MG tablet Take 1 tablet (500 mg total) by mouth 2 (two) times daily. 20 tablet 0   metoprolol tartrate (LOPRESSOR) 25 MG tablet TAKE 1 TABLET BY MOUTH TWICE DAILY 180 tablet 1   Misc. Devices MISC Incontinent supplies 1 each 0   mupirocin ointment (BACTROBAN) 2 % Apply 1 application topically 2 (two) times daily.  100 g 3   nitroGLYCERIN (NITROSTAT) 0.4 MG SL tablet Place 0.4 mg under the tongue every 5 (five) minutes as needed for chest pain.     pantoprazole (PROTONIX) 20 MG tablet Take 1 tablet (20 mg total) by mouth daily. 90 tablet 1   spironolactone (ALDACTONE) 25 MG tablet TAKE 1 TABLET BY MOUTH EVERY DAY 90 tablet 1   traZODone (DESYREL) 100 MG tablet Take 1 tablet (100 mg total) by mouth at bedtime. 90 tablet 1   venlafaxine XR (EFFEXOR-XR) 75 MG 24 hr capsule TAKE 3 CAPSULES BY MOUTH EVERY MORNING WITH BREAKFAST 90 capsule 0   diazepam (VALIUM) 5 MG tablet Please take 30 minutes prior to procedure. May repeat x 1 (Patient not taking: Reported on 07/11/2022) 2 tablet 0   Elastic Bandages & Supports (POST-OP SHOE/SOFT TOP WOMEN) MISC 1 each by Does not apply route daily as needed. (Patient not taking: Reported on 07/11/2022) 1 each 0   lisinopril (ZESTRIL) 10 MG tablet Take 1 tablet (10 mg total) by mouth daily. (Patient not taking: Reported on 07/11/2022) 90 tablet 1   metoCLOPramide (REGLAN) 10 MG tablet Take 10 mg by mouth every 6 (six) hours as needed for nausea or headache. (Patient not taking: Reported on 07/11/2022)     Vitamin D, Ergocalciferol, (DRISDOL) 1.25 MG (50000 UNIT) CAPS capsule Take 1 capsule (50,000 Units total) by mouth every 7 (seven) days. Take for 8 total doses(weeks) (Patient not taking: Reported on 07/11/2022) 8 capsule 0   No facility-administered medications prior to visit.    Allergies  Allergen Reactions   Ibuprofen Other (See Comments)    Can not take with current medications .   Tylenol [Acetaminophen] Other (See Comments)    Pt stated tylenol gives her extreme headache       Objective:    LMP 03/19/2016  Wt Readings from Last 3 Encounters:  04/09/22 179 lb 14.4 oz (81.6 kg)  02/25/22 189 lb (85.7 kg)  12/16/21 201 lb 6.4 oz (91.4 kg)    Physical Exam Vitals and nursing note reviewed.  Constitutional:      Appearance: She is well-developed.  HENT:     Head:  Normocephalic and atraumatic.  Cardiovascular:     Rate and Rhythm: Normal rate and regular rhythm.     Heart sounds: Normal heart sounds. No murmur heard.    No friction rub. No gallop.  Pulmonary:     Effort: Pulmonary effort is normal. No tachypnea or respiratory distress.     Breath sounds: Normal breath sounds. No decreased breath sounds, wheezing, rhonchi or rales.  Chest:     Chest wall: No tenderness.  Abdominal:     General: Bowel sounds are normal.     Palpations: Abdomen is soft.  Musculoskeletal:        General: Normal range of motion.     Cervical back: Normal range of motion.  Skin:    General: Skin is warm and dry.  Neurological:     Mental Status: She is alert and oriented to person, place, and time.     Coordination: Coordination normal.     Comments: Uses straight cane to assist with ambulation  Psychiatric:        Behavior: Behavior normal. Behavior is cooperative.        Thought Content: Thought content normal.        Judgment: Judgment normal.          Patient has been counseled extensively about nutrition and exercise as well as the importance of adherence with medications and regular follow-up. The patient was given clear instructions to go to ER or return to medical center if symptoms don't improve, worsen or new problems develop. The patient verbalized understanding.   Follow-up: Return in about 3 months (around 10/09/2022).   Claiborne Rigg, FNP-BC [facility] [place], [place] [phone]   07/11/2022, 9:24 AM

## 2022-07-12 LAB — BASIC METABOLIC PANEL
BUN/Creatinine Ratio: 14 (ref 9–23)
BUN: 15 mg/dL (ref 6–24)
CO2: 25 mmol/L (ref 20–29)
Calcium: 9.5 mg/dL (ref 8.7–10.2)
Chloride: 102 mmol/L (ref 96–106)
Creatinine, Ser: 1.04 mg/dL — ABNORMAL HIGH (ref 0.57–1.00)
Glucose: 111 mg/dL — ABNORMAL HIGH (ref 70–99)
Potassium: 4 mmol/L (ref 3.5–5.2)
Sodium: 144 mmol/L (ref 134–144)
eGFR: 65 mL/min/{1.73_m2} (ref >59)

## 2022-07-12 LAB — LIPID PANEL
Chol/HDL Ratio: 3.5 ratio (ref 0.0–4.4)
Cholesterol, Total: 158 mg/dL (ref 100–199)
HDL: 45 mg/dL (ref >39)
LDL Chol Calc (NIH): 96 mg/dL (ref 0–99)
Triglycerides: 89 mg/dL (ref 0–149)
VLDL Cholesterol Cal: 17 mg/dL (ref 5–40)

## 2022-07-12 LAB — HEMOGLOBIN A1C
Est. average glucose Bld gHb Est-mCnc: 117 mg/dL
Hgb A1c MFr Bld: 5.7 % — ABNORMAL HIGH (ref 4.8–5.6)

## 2022-07-14 ENCOUNTER — Other Ambulatory Visit: Payer: Self-pay | Admitting: Neurology

## 2022-07-22 ENCOUNTER — Other Ambulatory Visit: Payer: Self-pay | Admitting: Family Medicine

## 2022-07-22 ENCOUNTER — Other Ambulatory Visit: Payer: Self-pay | Admitting: Nurse Practitioner

## 2022-07-22 DIAGNOSIS — E785 Hyperlipidemia, unspecified: Secondary | ICD-10-CM

## 2022-07-22 DIAGNOSIS — I10 Essential (primary) hypertension: Secondary | ICD-10-CM

## 2022-07-28 ENCOUNTER — Ambulatory Visit: Payer: 59 | Attending: Nurse Practitioner

## 2022-07-28 DIAGNOSIS — Z0000 Encounter for general adult medical examination without abnormal findings: Secondary | ICD-10-CM

## 2022-07-28 DIAGNOSIS — I639 Cerebral infarction, unspecified: Secondary | ICD-10-CM | POA: Diagnosis not present

## 2022-07-28 NOTE — Patient Instructions (Signed)
Brandy Roberts , Thank you for taking time to come for your Medicare Wellness Visit. I appreciate your ongoing commitment to your health goals. Please review the following plan we discussed and let me know if I can assist you in the future.   These are the goals we discussed:  Goals      Blood Pressure < 140/90        This is a list of the screening recommended for you and due dates:  Health Maintenance  Topic Date Due   Yearly kidney health urinalysis for diabetes  Never done   Pap Smear  05/16/2022   COVID-19 Vaccine (1) 08/13/2022*   Zoster (Shingles) Vaccine (1 of 2) 10/09/2022*   Mammogram  01/11/2023   Yearly kidney function blood test for diabetes  07/12/2023   Medicare Annual Wellness Visit  07/29/2023   Hepatitis C Screening: USPSTF Recommendation to screen - Ages 18-79 yo.  Completed   HIV Screening  Completed   HPV Vaccine  Aged Out   DTaP/Tdap/Td vaccine  Discontinued   Flu Shot  Discontinued   Colon Cancer Screening  Discontinued  *Topic was postponed. The date shown is not the original due date.   Health Maintenance, Female Adopting a healthy lifestyle and getting preventive care are important in promoting health and wellness. Ask your health care provider about: The right schedule for you to have regular tests and exams. Things you can do on your own to prevent diseases and keep yourself healthy. What should I know about diet, weight, and exercise? Eat a healthy diet  Eat a diet that includes plenty of vegetables, fruits, low-fat dairy products, and lean protein. Do not eat a lot of foods that are high in solid fats, added sugars, or sodium. Maintain a healthy weight Body mass index (BMI) is used to identify weight problems. It estimates body fat based on height and weight. Your health care provider can help determine your BMI and help you achieve or maintain a healthy weight. Get regular exercise Get regular exercise. This is one of the most important things  you can do for your health. Most adults should: Exercise for at least 150 minutes each week. The exercise should increase your heart rate and make you sweat (moderate-intensity exercise). Do strengthening exercises at least twice a week. This is in addition to the moderate-intensity exercise. Spend less time sitting. Even light physical activity can be beneficial. Watch cholesterol and blood lipids Have your blood tested for lipids and cholesterol at 53 years of age, then have this test every 5 years. Have your cholesterol levels checked more often if: Your lipid or cholesterol levels are high. You are older than 53 years of age. You are at high risk for heart disease. What should I know about cancer screening? Depending on your health history and family history, you may need to have cancer screening at various ages. This may include screening for: Breast cancer. Cervical cancer. Colorectal cancer. Skin cancer. Lung cancer. What should I know about heart disease, diabetes, and high blood pressure? Blood pressure and heart disease High blood pressure causes heart disease and increases the risk of stroke. This is more likely to develop in people who have high blood pressure readings or are overweight. Have your blood pressure checked: Every 3-5 years if you are 68-47 years of age. Every year if you are 15 years old or older. Diabetes Have regular diabetes screenings. This checks your fasting blood sugar level. Have the screening done: Once every  three years after age 91 if you are at a normal weight and have a low risk for diabetes. More often and at a younger age if you are overweight or have a high risk for diabetes. What should I know about preventing infection? Hepatitis B If you have a higher risk for hepatitis B, you should be screened for this virus. Talk with your health care provider to find out if you are at risk for hepatitis B infection. Hepatitis C Testing is recommended  for: Everyone born from [birth year] through 1966. Anyone with known risk factors for hepatitis C. Sexually transmitted infections (STIs) Get screened for STIs, including gonorrhea and chlamydia, if: You are sexually active and are younger than 53 years of age. You are older than 53 years of age and your health care provider tells you that you are at risk for this type of infection. Your sexual activity has changed since you were last screened, and you are at increased risk for chlamydia or gonorrhea. Ask your health care provider if you are at risk. Ask your health care provider about whether you are at high risk for HIV. Your health care provider may recommend a prescription medicine to help prevent HIV infection. If you choose to take medicine to prevent HIV, you should first get tested for HIV. You should then be tested every 3 months for as long as you are taking the medicine. Pregnancy If you are about to stop having your period (premenopausal) and you may become pregnant, seek counseling before you get pregnant. Take 400 to 800 micrograms (mcg) of folic acid every day if you become pregnant. Ask for birth control (contraception) if you want to prevent pregnancy. Osteoporosis and menopause Osteoporosis is a disease in which the bones lose minerals and strength with aging. This can result in bone fractures. If you are 16 years old or older, or if you are at risk for osteoporosis and fractures, ask your health care provider if you should: Be screened for bone loss. Take a calcium or vitamin D supplement to lower your risk of fractures. Be given hormone replacement therapy (HRT) to treat symptoms of menopause. Follow these instructions at home: Alcohol use Do not drink alcohol if: Your health care provider tells you not to drink. You are pregnant, may be pregnant, or are planning to become pregnant. If you drink alcohol: Limit how much you have to: 0-1 drink a day. Know how much alcohol is in  your drink. In the [place] one drink equals one 12 oz bottle of beer (355 mL), one 5 oz glass of wine (148 mL), or one 1 oz glass of hard liquor (44 mL). Lifestyle Do not use any products that contain nicotine or tobacco. These products include cigarettes, chewing tobacco, and vaping devices, such as e-cigarettes. If you need help quitting, ask your health care provider. Do not use street drugs. Do not share needles. Ask your health care provider for help if you need support or information about quitting drugs. General instructions Schedule regular health, dental, and eye exams. Stay current with your vaccines. Tell your health care provider if: You often feel depressed. You have ever been abused or do not feel safe at home. Summary Adopting a healthy lifestyle and getting preventive care are important in promoting health and wellness. Follow your health care provider's instructions about healthy diet, exercising, and getting tested or screened for diseases. Follow your health care provider's instructions on monitoring your cholesterol and blood pressure. This information is not  intended to replace advice given to you by your health care provider. Make sure you discuss any questions you have with your health care provider. Document Revised: 10/08/2020 Document Reviewed: 10/08/2020 Elsevier Patient Education  [place].

## 2022-07-28 NOTE — Progress Notes (Signed)
Subjective:   Brandy Roberts is a 52 y.o. female who presents for Medicare Annual (Subsequent) preventive examination.  Review of Systems     I connected with Brandy Roberts on 07/28/2022 at 10:40 am by telephone and verified that I am speaking with the correct person using two identifiers. I discussed the limitations, risks, security and privacy concerns of performing an evaluation and management service by telephone and the availability of in person appointments. I also discussed with the patient that there may be a patient responsible charge related to this service. The patient expressed understanding and agreed to proceed.   Patient location: Home My Location: [place] on the telephone call: Myself and Patinet     Cardiac Risk Factors include: none     Objective:    There were no vitals filed for this visit. There is no height or weight on file to calculate BMI.     07/28/2022   10:43 AM 10/30/2021    3:06 PM 09/17/2021    8:05 AM 06/04/2021    9:58 AM 05/07/2021   11:10 AM 04/30/2021    5:46 PM 01/16/2021    9:08 AM  Advanced Directives  Does Patient Have a Medical Advance Directive? No No No No No No No  Would patient like information on creating a medical advance directive? Yes (ED - Information included in AVS)  No - Patient declined  Yes (MAU/Ambulatory/Procedural Areas - Information given) No - Patient declined     Current Medications (verified) Outpatient Encounter Medications as of 07/28/2022  Medication Sig   amLODipine (NORVASC) 10 MG tablet TAKE 1 TABLET BY MOUTH EVERY DAY FOR BLOOD PRESSURE   Atogepant (QULIPTA) 60 MG TABS TAKE 1 TABLET BY MOUTH EVERY DAY   atorvastatin (LIPITOR) 80 MG tablet TAKE 1 TABLET BY MOUTH EVERY DAY AT 6PM FOR CHOLESTEROL   busPIRone (BUSPAR) 10 MG tablet Take 1 tablet (10 mg total) by mouth 3 (three) times daily.   clopidogrel (PLAVIX) 75 MG tablet Take 1 tablet (75 mg total) by mouth daily.    flurbiprofen (ANSAID) 100 MG tablet TAKE 1 TABLET BY MOUTH EVERY 8 HOURS AS NEEDED (NOT MORE THAN 3 TABLETS A DAY)   Galcanezumab-gnlm 120 MG/ML SOAJ Inject 120 mg into the skin every 30 (thirty) days.   hydrALAZINE (APRESOLINE) 50 MG tablet TAKE 1 TABLET BY MOUTH 3 TIMES DAILY   hydrochlorothiazide (MICROZIDE) 12.5 MG capsule TAKE 1 CAPSULE BY MOUTH EVERY DAY   hydrOXYzine (ATARAX/VISTARIL) 50 MG tablet Take 50 mg by mouth 3 (three) times daily as needed for anxiety or sleep.   methocarbamol (ROBAXIN) 500 MG tablet Take 1 tablet (500 mg total) by mouth 2 (two) times daily.   metoprolol tartrate (LOPRESSOR) 25 MG tablet TAKE 1 TABLET BY MOUTH TWICE DAILY   Misc. Devices MISC Incontinent supplies   mupirocin ointment (BACTROBAN) 2 % Apply 1 Application topically 2 (two) times daily.   nitroGLYCERIN (NITROSTAT) 0.4 MG SL tablet Place 0.4 mg under the tongue every 5 (five) minutes as needed for chest pain.   pantoprazole (PROTONIX) 20 MG tablet Take 1 tablet (20 mg total) by mouth daily.   spironolactone (ALDACTONE) 25 MG tablet TAKE 1 TABLET BY MOUTH EVERY DAY   traZODone (DESYREL) 100 MG tablet Take 1 tablet (100 mg total) by mouth at bedtime.   venlafaxine XR (EFFEXOR-XR) 75 MG 24 hr capsule TAKE 3 CAPSULES BY MOUTH EVERY MORNING WITH BREAKFAST   Elastic Bandages & Supports (POST-OP SHOE/SOFT TOP  WOMEN) MISC 1 each by Does not apply route daily as needed. (Patient not taking: Reported on 07/11/2022)   HYDROcodone-acetaminophen (NORCO/VICODIN) 5-325 MG tablet Take 1 tablet by mouth every 8 (eight) hours as needed. (Patient not taking: Reported on 07/28/2022)   lisinopril (ZESTRIL) 10 MG tablet Take 1 tablet (10 mg total) by mouth daily. (Patient not taking: Reported on 07/11/2022)   metoCLOPramide (REGLAN) 10 MG tablet Take 10 mg by mouth every 6 (six) hours as needed for nausea or headache. (Patient not taking: Reported on 07/11/2022)   Vitamin D, Ergocalciferol, (DRISDOL) 1.25 MG (50000 UNIT) CAPS  capsule Take 1 capsule (50,000 Units total) by mouth every 7 (seven) days. Take for 8 total doses(weeks) (Patient not taking: Reported on 07/11/2022)   No facility-administered encounter medications on file as of 07/28/2022.    Allergies (verified) Ibuprofen and Tylenol [acetaminophen]   History: Past Medical History:  Diagnosis Date   Anemia    Anxiety    Aortic regurgitation    Cardiomyopathy due to hypertension ([place]) 10/23/2014   Chest discomfort 10/12/2018   Chronic ischemic vertebrobasilar artery thalamic stroke    Depression    Essential hypertension 05/02/2015   Facet arthropathy 05/15/2021   Former smoker 01/24/2015   H/O noncompliance with medical treatment, presenting hazards to health    Headache 05/01/2015   History of ischemic middle cerebral artery stroke embolic    Hyperlipidemia    Hypertension    Hypertensive heart disease    Lumbar radiculopathy 05/01/2021   Migraine 05/21/2018   Migraines    Mixed dyslipidemia 11/26/2020   Nonrheumatic aortic (valve) insufficiency    Obesity (BMI 35.0-39.9 without comorbidity) 11/26/2020   Osteoarthritis    Overweight (BMI 25.0-29.9)    Palpitations 10/12/2018   SI joint arthritis 05/15/2021   Sleep apnea    Snoring 05/02/2015   Stroke ([place]) 10/25/2016   Syncope and collapse 10/07/2018   Vitamin D deficiency 05/15/2021   Past Surgical History:  Procedure Laterality Date   CESAREAN SECTION     GANGLION CYST EXCISION     TEE WITHOUT CARDIOVERSION N/A 10/09/2014   Procedure: TRANSESOPHAGEAL ECHOCARDIOGRAM (TEE);  Surgeon: Sanda Klein, MD;  Location[facility];  Service: Cardiovascular;  Laterality: N/A;   TONSILLECTOMY     Family History  Problem Relation Age of Onset   Cancer Mother        type unknown   Hypertension Mother    Hypertension Sister    Diabetes Sister    Cancer Maternal Aunt        4 aunts died of cancer types unknown   Social History   Socioeconomic History   Marital status: Single    Spouse  name: Not on file   Number of children: 2   Years of education: 12   Highest education level: High school graduate  Occupational History   Occupation: disable   Tobacco Use   Smoking status: Former    Packs/day: 0.25    Types: Cigarettes    Quit date: 08/31/2017    Years since quitting: 4.9   Smokeless tobacco: Never  Vaping Use   Vaping Use: Never used  Substance and Sexual Activity   Alcohol use: No    Alcohol/week: 0.0 standard drinks of alcohol   Drug use: Yes    Types: Marijuana   Sexual activity: Not on file  Other Topics Concern   Not on file  Social History Narrative   Patient lives with her uncle in a one story home.  Has 2 sons.  Currently on disability.  Education: high school.   Drinks 1-2 sodas a week       Social Determinants of Health   Financial Resource Strain: Low Risk  (07/28/2022)   Overall Financial Resource Strain (CARDIA)    Difficulty of Paying Living Expenses: Not hard at all  Food Insecurity: Food Insecurity Present (07/28/2022)   Hunger Vital Sign    Worried About Running Out of Food in the Last Year: Sometimes true    Ran Out of Food in the Last Year: Sometimes true  Transportation Needs: No Transportation Needs (07/28/2022)   PRAPARE - Hydrologist (Medical): No    Lack of Transportation (Non-Medical): No  Physical Activity: Insufficiently Active (07/28/2022)   Exercise Vital Sign    Days of Exercise per Week: 3 days    Minutes of Exercise per Session: 10 min  Stress: No Stress Concern Present (07/28/2022)   [place]    Feeling of Stress : Not at all  Social Connections: Socially Isolated (07/28/2022)   Social Connection and Isolation Panel [NHANES]    Frequency of Communication with Friends and Family: More than three times a week    Frequency of Social Gatherings with Friends and Family: Three times a week    Attends Religious Services: Never     Active Member of Clubs or Organizations: No    Attends Archivist Meetings: Never    Marital Status: Never married    Tobacco Counseling Counseling given: Not Answered   Clinical Intake:  Pre-visit preparation completed: No  Pain : No/denies pain     Nutritional Risks: None Diabetes: No  How often do you need to have someone help you when you read instructions, pamphlets, or other written materials from your doctor or pharmacy?: 1 - Never  Interpreter Needed?: No    Activities of Daily Living    07/28/2022   11:10 AM  In your present state of health, do you have any difficulty performing the following activities:  Hearing? 0  Vision? 1  Difficulty concentrating or making decisions? 1  Walking or climbing stairs? 1  Dressing or bathing? 1  Doing errands, shopping? 0  Preparing Food and eating ? Y  Using the Toilet? Y  In the past six months, have you accidently leaked urine? N  Do you have problems with loss of bowel control? Y  Managing your Medications? N  Managing your Finances? N  Housekeeping or managing your Housekeeping? Y    Patient Care Team: [name], NP as PCP - General (Nurse Practitioner) Revankar, Reita Cliche, MD as PCP - Cardiology (Cardiology) Pieter Partridge, DO as Consulting Physician (Neurology)  Indicate any recent Medical Services you may have received from other than [facility] providers in the past year (date may be approximate).     Assessment:   This is a routine wellness examination for [place].  Hearing/Vision screen No results found.  Dietary issues and exercise activities discussed: Current Exercise Habits: The patient does not participate in regular exercise at present, Exercise limited by: None identified   Goals Addressed   None    Depression Screen    07/28/2022   10:44 AM 07/11/2022    9:24 AM 04/09/2022    9:44 AM 10/10/2021    9:19 AM 06/19/2021    9:38 AM 03/20/2021   10:27 AM 12/17/2020    9:45 AM  PHQ 2/9  Scores  PHQ - 2 Score '2 2 3 4 2 4 2  '$ PHQ- 9 Score '6 9 17 17 9 18 8    '$ Fall Risk    07/28/2022   10:44 AM 07/11/2022    9:20 AM 04/09/2022    9:32 AM 06/19/2021    9:13 AM 03/20/2021   10:27 AM  Fall Risk   Falls in the past year? 1 0 0 1 0  Number falls in past yr: 1 0 0 1 0  Injury with Fall? 0 0 0 0 0  Risk for fall due to : History of fall(s) No Fall Risks  Impaired mobility;Impaired balance/gait No Fall Risks  Follow up Falls prevention discussed Falls evaluation completed Falls evaluation completed Falls evaluation completed     [place]:  Any stairs in or around the home? No  If so, are there any without handrails? No  Home free of loose throw rugs in walkways, pet beds, electrical cords, etc? Yes  Adequate lighting in your home to reduce risk of falls? Yes   ASSISTIVE DEVICES UTILIZED TO PREVENT FALLS:  Life alert? No  Use of a cane, walker or w/c? Yes  Grab bars in the bathroom? Yes  Shower chair or bench in shower? No  Elevated toilet seat or a handicapped toilet? No   TIMED UP AND GO:  Was the test performed? No .  Length of time to ambulate 10 feet: N/A sec.   Gait slow and steady with assistive device  Cognitive Function:        07/28/2022   10:45 AM  6CIT Screen  What Year? 0 points  What month? 0 points  What time? 0 points  Count back from 20 0 points  Months in reverse 0 points  Repeat phrase 0 points  Total Score 0 points    Immunizations  There is no immunization history on file for this patient.  TDAP status: Due, Education has been provided regarding the importance of this vaccine. Advised may receive this vaccine at local pharmacy or Health Dept. Aware to provide a copy of the vaccination record if obtained from local pharmacy or Health Dept. Verbalized acceptance and understanding. Patient declined  Flu Vaccine status: Declined, Education has been provided regarding the importance of this vaccine but  patient still declined. Advised may receive this vaccine at local pharmacy or Health Dept. Aware to provide a copy of the vaccination record if obtained from local pharmacy or Health Dept. Verbalized acceptance and understanding.  Pneumococcal vaccine status: Declined,  Education has been provided regarding the importance of this vaccine but patient still declined. Advised may receive this vaccine at local pharmacy or Health Dept. Aware to provide a copy of the vaccination record if obtained from local pharmacy or Health Dept. Verbalized acceptance and understanding.   Covid-19 vaccine status: Declined, Education has been provided regarding the importance of this vaccine but patient still declined. Advised may receive this vaccine at local pharmacy or Health Dept.or vaccine clinic. Aware to provide a copy of the vaccination record if obtained from local pharmacy or Health Dept. Verbalized acceptance and understanding.  Qualifies for Shingles Vaccine? Yes   Zostavax completed No   Shingrix Completed?: No.    Education has been provided regarding the importance of this vaccine. Patient has been advised to call insurance company to determine out of pocket expense if they have not yet received this vaccine. Advised may also receive vaccine at local pharmacy or Health Dept.  Verbalized acceptance and understanding. Patient declined  Screening Tests Health Maintenance  Topic Date Due   Diabetic kidney evaluation - Urine ACR  Never done   Medicare Annual Wellness (AWV)  03/20/2022   PAP SMEAR-Modifier  05/16/2022   COVID-19 Vaccine (1) 08/13/2022 (Originally 08/22/1975)   Zoster Vaccines- Shingrix (1 of 2) 10/09/2022 (Originally 08/21/1988)   MAMMOGRAM  01/11/2023   Diabetic kidney evaluation - eGFR measurement  07/12/2023   Hepatitis C Screening  Completed   HIV Screening  Completed   HPV VACCINES  Aged Out   DTaP/Tdap/Td  Discontinued   INFLUENZA VACCINE  Discontinued   COLONOSCOPY (Pts 45-45yr  Insurance coverage will need to be confirmed)  Discontinued    Health Maintenance  Health Maintenance Due  Topic Date Due   Diabetic kidney evaluation - Urine ACR  Never done   Medicare Annual Wellness (AWV)  03/20/2022   PAP SMEAR-Modifier  05/16/2022    Colorectal cancer screening: Type of screening: FOBT/FIT. Completed 10/10/2021. Repeat every 10 years  Mammogram status: Ordered 07/11/2022. Pt provided with contact info and advised to call to schedule appt.   Lung Cancer Screening: (Low Dose CT Chest recommended if Age 53-80years, 30 pack-year currently smoking OR have quit w/in 15years.) does not qualify.   Lung Cancer Screening Referral: N/A  Additional Screening:  Hepatitis C Screening: does qualify; Completed 09/04/2020  Vision Screening: Recommended annual ophthalmology exams for early detection of glaucoma and other disorders of the eye. Is the patient up to date with their annual eye exam?  Yes  Who is the provider or what is the name of the office in which the patient attends annual eye exams? [facility]If pt is not established with a provider, would they like to be referred to a provider to establish care? No .   Dental Screening: Recommended annual dental exams for proper oral hygiene  Community Resource Referral / Chronic Care Management: CRR required this visit?  No   CCM required this visit?  No      Plan:     I have personally reviewed and noted the following in the patient's chart:   Medical and social history Use of alcohol, tobacco or illicit drugs  Current medications and supplements including opioid prescriptions. Patient is not currently taking opioid prescriptions. Functional ability and status Nutritional status Physical activity Advanced directives List of other physicians Hospitalizations, surgeries, and ER visits in previous 12 months Vitals Screenings to include cognitive, depression, and falls Referrals and appointments  In  addition, I have reviewed and discussed with patient certain preventive protocols, quality metrics, and best practice recommendations. A written personalized care plan for preventive services as well as general preventive health recommendations were provided to patient.     [place]  07/28/2022   Nurse Notes: I spent 30 minutes on this telephone encounter AVS mailed to patinet

## 2022-08-12 ENCOUNTER — Other Ambulatory Visit: Payer: Self-pay | Admitting: Neurology

## 2022-08-25 ENCOUNTER — Telehealth: Payer: Self-pay | Admitting: Nurse Practitioner

## 2022-08-25 NOTE — Telephone Encounter (Signed)
Copied from [place] [phone]. Topic: General - Other >> Aug 25, 2022  8:05 AM Oley Balm A wrote: Reason for CRM: Tiarra with [facility] is checking to see if PCP received the [facility] Nutritional Supplies for pt. Please call Tiarra back.

## 2022-08-27 NOTE — Telephone Encounter (Signed)
NOTED

## 2022-08-27 NOTE — Telephone Encounter (Addendum)
Pt is calling back to see if PCP can sign the paper work that was sent over by [facility] for her Nutritional Supplies. Per pt she needs the paper signed and sent back today so she can get her drink she is needing for her Nutrition. Please advise.   Pt is wanting to speak with her PCP.

## 2022-08-27 NOTE — Telephone Encounter (Signed)
Rep at home care that order will be faxed monday

## 2022-08-28 DIAGNOSIS — I639 Cerebral infarction, unspecified: Secondary | ICD-10-CM | POA: Diagnosis not present

## 2022-09-02 NOTE — Telephone Encounter (Addendum)
Notified via [facility] NT line paper work had been faxed

## 2022-09-02 NOTE — Telephone Encounter (Signed)
Isabella with [facility] is calling to check and see if the paper work will be faxed back to them. Please call Bubba Hales back [phone] .

## 2022-09-09 ENCOUNTER — Ambulatory Visit
Admission: RE | Admit: 2022-09-09 | Discharge: 2022-09-09 | Disposition: A | Discharge status: Home / Self Care | Payer: 59 | Source: Ambulatory Visit | Attending: Nurse Practitioner | Admitting: Nurse Practitioner

## 2022-09-09 ENCOUNTER — Other Ambulatory Visit: Payer: Self-pay | Admitting: Neurology

## 2022-09-09 ENCOUNTER — Other Ambulatory Visit: Payer: Self-pay | Admitting: Nurse Practitioner

## 2022-09-09 DIAGNOSIS — I10 Essential (primary) hypertension: Secondary | ICD-10-CM

## 2022-09-09 DIAGNOSIS — Z1231 Encounter for screening mammogram for malignant neoplasm of breast: Secondary | ICD-10-CM

## 2022-09-09 NOTE — Telephone Encounter (Signed)
Rx sent in February with enough medication for 90-day, refill requested too early. Denied.

## 2022-09-17 ENCOUNTER — Encounter: Payer: Self-pay | Admitting: *Deleted

## 2022-09-18 DIAGNOSIS — H538 Other visual disturbances: Secondary | ICD-10-CM | POA: Diagnosis not present

## 2022-09-18 DIAGNOSIS — I639 Cerebral infarction, unspecified: Secondary | ICD-10-CM | POA: Diagnosis not present

## 2022-09-18 DIAGNOSIS — H2511 Age-related nuclear cataract, right eye: Secondary | ICD-10-CM | POA: Diagnosis not present

## 2022-10-01 DIAGNOSIS — I639 Cerebral infarction, unspecified: Secondary | ICD-10-CM | POA: Diagnosis not present

## 2022-10-06 ENCOUNTER — Other Ambulatory Visit: Payer: Self-pay | Admitting: Physician Assistant

## 2022-10-06 DIAGNOSIS — K2100 Gastro-esophageal reflux disease with esophagitis, without bleeding: Secondary | ICD-10-CM

## 2022-10-15 ENCOUNTER — Telehealth: Payer: Self-pay | Admitting: Nurse Practitioner

## 2022-10-15 ENCOUNTER — Other Ambulatory Visit: Payer: Self-pay | Admitting: Neurology

## 2022-10-15 NOTE — Telephone Encounter (Signed)
Copied from CRM [id number]. Topic: General - Other >> Oct 15, 2022  1:25 PM Santiya F wrote: Reason for CRM: Pt is calling in to see if [facility] received her paperwork for her incontinence supplies. Pt would like a call back to let her know the paperwork was or wasn't received. Please advise.

## 2022-10-16 NOTE — Telephone Encounter (Signed)
Unable to reach patient by phone. Voicemail left with response.

## 2022-10-29 NOTE — Progress Notes (Signed)
NEUROLOGY FOLLOW UP OFFICE NOTE  Brandy Roberts [id number]  Assessment/Plan:   1.  Migraine without aura, without status migrainosus, not intractable - today with a persistent headache for a week. 2. Recurrent loss of consciousness.  Semiology characteristic of syncope.  May be due to orthostatic hypotension, vasovagal or cardiac source.  The first event occurred in setting of severe migraine, which may have been vasovagal.  MRI of brain and EEG unremarkable. 3.  Frequent falls.  I think this is secondary to her residual left sided weakness.  Encouraged her to use walker instead of cane if needed.       1.  Migraine prophylaxis:  Qulipta 60mg  daily; venlafaxine XR 225mg  daily  3.  Migraine rescue:  Flurbiprofen 100mg   4.  Limit use of pain relievers to no more than 2 days out of week to prevent risk of rebound or medication-overuse headache. 5.  Keep headache diary 6.  Follow up in 1 year     Subjective:  Brandy Roberts is a 52 year old right-handed woman with aortic insufficiency, hypertension, hyperlipidemia and history of stroke who follows up for migraines.   UPDATE: Due to discomfort with injections, switched from Emgality to [place]   Intensity: Moderate Duration: 2 to 3 hours Frequency: 2 days a month     Current NSAIDS: Flurbiprofen 100mg  Current analgesics: None Current triptans: None Current ergotamine: None Current anti-emetic:  Reglan 10 mg Current muscle relaxants: None Current anti-anxiolytic: None Current sleep aide: trazodone Current Antihypertensive medications: Amlodipine 10 mg, hydralazine, Spironolactone, lisinopril, Lopressor Current Antidepressant medications: Venlafaxine XR 225 mg Current Anticonvulsant medications:Lyrica Current anti-CGRP: Qulipta 60mg  daily Current Vitamins/Herbal/Supplements: None Current Antihistamines/Decongestants: None Other therapy: None Hormone/birth control: None     Caffeine: Coffee, sweet tea, not  daily Alcohol: No Smoker: Cigarettes Diet: Hydrates Exercise: No Depression: Yes; Anxiety: Yes Other pain: Chronic right knee pain Sleep hygiene: Sometimes tosses and turns     HISTORY: MIGRAINES Onset: Migraines since she was young, but worse since her stroke in May 2016. Location:  holocephalic Quality:  pounding Initial Intensity:  10/10 Aura:  no Prodrome:  no Postdrome:  no Associated symptoms: Photophobia, phonophobia.  Rarely nausea.  She has not had any new worse headache of her life, waking up from sleep Initial Duration:  2 days (but Fioricet and ibuprofen lowers intensity down to 2-3/10) Initial Frequency:  Once or twice a month Frequency of abortive medication: only as needed Triggers: None Relieving factors:  Laying down.  Butalbital, ibuprofen.  Tramadol helped but she was told not to take it. Activity:  aggravates   Past NSAIDS:  Ibuprofen, naproxen Past analgesics:  Tylenol, Excedrin, Tramadol (effective but GI upset), Fioricet Past abortive triptans:  no Past muscle relaxants:  no Past anti-emetic:  Zofran Past antihypertensive medications:  no Past antidepressant medications:  fluoxetine Past anticonvulsant medications: topiramate, gabapentin Past CGRP inhibitor:  Emgality (uncomfortable with injections) Past vitamins/Herbal/Supplements:  no Other past therapies:  no   Family history of headache:  Sister.   STROKE:  She had a stroke in May 2016 with left sided weakness.  MRI of brain from 10/06/14 was personally reviewed and revealed small acute lacunar infarcts in the right thalamus and right corona radiata, as well as chronic lacunar infarcts in the left hemisphere.  CTA of head and neck revealed diffuse bilateral petrous and cavernous carotid stenosis.  Cardiac source of embolus was not discovered.     She was admitted to [facility] from 10/24/16 to 10/26/16  for stroke-like event.  She suddenly developed left sided weakness and slurred speech with  associated headache.  Blood pressure in ED was 120/66.  CT of head was personally reviewed and negative for acute abnormality.  She had refused tPA.  MRI of brain was personally reviewed and revealed no acute stroke or bleed.  MRA of head revealed no significant intracranial stenosis or occlusion.  LDL was 84.  Hgb A1c was 5.7.  As per neurology evaluation, her deficits appeared to be non-organic, with unusual speech pattern and giveaway weakness.  Somatization was suspected.  2D echo from 11/06/16 demonstrated normal LV EF of 65-70% with no cardiac source of emboli.  Atorvastatin was increased from 10mg  to 80mg  daily.  Differential includes TIA vs hemiplegic migraine vs somatization.   SYNCOPE: On 09/21/18, she presented to the ED at [facility] for a 5 day intractable holocephalic throbbing headache.  She reports that when EMS was leading her to the ambulance from her bedroom, she passed out for 20-30 seconds.  She described sensation of lightheadedness, like she was going to pass out, as well as tunnel vision and palpitations.  She was afebrile.  CT of head revealed her old left basal ganglia infarct but no acute abnormality.  She was treated with Reglan and Benadryl and headache reportedly resolved.  On 09/26/18, she was sitting on her barstool when she felt lightheaded again.  She noted palpitations as well.  She got up and started walking to her bedroom when she noted tunnel vision and passed out.  She woke up on the floor and bruised her back and leg.  She thinks she may have been unconsciousness for 20 to 30 minute.  There were no witnesses.  She noted urinary incontinence but did not bite her tongue.  She did not have a headache.  She denies change in medications over the past week.  She denies fever or illness.  Due to episodes of recurrent syncopal events, she underwent workup.  MRI of brain with and without contrast on 10/11/18 was personally reviewed and demonstrated stable chronic infarcts but no acute  intracranial abnormality.  EEG performed on 10/21/18 was normal. No recurrent spells.   FALLS: In 2019, she endorsed increased problems with balance and falls.  She stated that they were associated with headache and worsening of her left-sided weakness.  MRI of the brain without contrast from 11/13/2017 was personally reviewed and demonstrated no new intracranial abnormalities.  PAST MEDICAL HISTORY: Past Medical History:  Diagnosis Date   Anemia    Anxiety    Aortic regurgitation    Cardiomyopathy due to hypertension [name] 10/23/2014   Chest discomfort 10/12/2018   Chronic ischemic vertebrobasilar artery thalamic stroke    Depression    Essential hypertension 05/02/2015   Facet arthropathy 05/15/2021   Former smoker 01/24/2015   H/O noncompliance with medical treatment, presenting hazards to health    Headache 05/01/2015   History of ischemic middle cerebral artery stroke embolic    Hyperlipidemia    Hypertension    Hypertensive heart disease    Lumbar radiculopathy 05/01/2021   Migraine 05/21/2018   Migraines    Mixed dyslipidemia 11/26/2020   Nonrheumatic aortic (valve) insufficiency    Obesity (BMI 35.0-39.9 without comorbidity) 11/26/2020   Osteoarthritis    Overweight (BMI 25.0-29.9)    Palpitations 10/12/2018   SI joint arthritis 05/15/2021   Sleep apnea    Snoring 05/02/2015   Stroke [name] 10/25/2016   Syncope and collapse 10/07/2018   Vitamin  D deficiency 05/15/2021    MEDICATIONS: Current Outpatient Medications on File Prior to Visit  Medication Sig Dispense Refill   amLODipine (NORVASC) 10 MG tablet TAKE 1 TABLET BY MOUTH EVERY DAY FOR BLOOD PRESSURE 90 tablet 1   Atogepant (QULIPTA) 60 MG TABS TAKE 1 TABLET BY MOUTH EVERY DAY 30 tablet 3   atorvastatin (LIPITOR) 80 MG tablet TAKE 1 TABLET BY MOUTH EVERY DAY AT 6PM FOR CHOLESTEROL 90 tablet 1   busPIRone (BUSPAR) 10 MG tablet Take 1 tablet (10 mg total) by mouth 3 (three) times daily. 90 tablet 3   clopidogrel  (PLAVIX) 75 MG tablet Take 1 tablet (75 mg total) by mouth daily. 90 tablet 3   Elastic Bandages & Supports (POST-OP SHOE/SOFT TOP WOMEN) MISC 1 each by Does not apply route daily as needed. (Patient not taking: Reported on 07/11/2022) 1 each 0   flurbiprofen (ANSAID) 100 MG tablet TAKE 1 TABLET BY MOUTH EVERY 8 HOURS AS NEEDED (MAX OF 3 TABLETS A DAY) 30 tablet 5   Galcanezumab-gnlm 120 MG/ML SOAJ Inject 120 mg into the skin every 30 (thirty) days. 1.12 mL 0   hydrALAZINE (APRESOLINE) 50 MG tablet TAKE 1 TABLET BY MOUTH 3 TIMES DAILY 270 tablet 1   hydrochlorothiazide (MICROZIDE) 12.5 MG capsule TAKE 1 CAPSULE BY MOUTH EVERY DAY 90 capsule 1   HYDROcodone-acetaminophen (NORCO/VICODIN) 5-325 MG tablet Take 1 tablet by mouth every 8 (eight) hours as needed. (Patient not taking: Reported on 07/28/2022) 15 tablet 0   hydrOXYzine (ATARAX/VISTARIL) 50 MG tablet Take 50 mg by mouth 3 (three) times daily as needed for anxiety or sleep.     lisinopril (ZESTRIL) 10 MG tablet Take 1 tablet (10 mg total) by mouth daily. (Patient not taking: Reported on 07/11/2022) 90 tablet 1   methocarbamol (ROBAXIN) 500 MG tablet Take 1 tablet (500 mg total) by mouth 2 (two) times daily. 20 tablet 0   metoCLOPramide (REGLAN) 10 MG tablet Take 10 mg by mouth every 6 (six) hours as needed for nausea or headache. (Patient not taking: Reported on 07/11/2022)     metoprolol tartrate (LOPRESSOR) 25 MG tablet TAKE 1 TABLET BY MOUTH TWICE DAILY 180 tablet 1   Misc. Devices MISC Incontinent supplies 1 each 0   mupirocin ointment (BACTROBAN) 2 % Apply 1 Application topically 2 (two) times daily. 100 g 3   nitroGLYCERIN (NITROSTAT) 0.4 MG SL tablet Place 0.4 mg under the tongue every 5 (five) minutes as needed for chest pain.     pantoprazole (PROTONIX) 20 MG tablet TAKE 1 TABLET BY MOUTH EVERY DAY 90 tablet 0   spironolactone (ALDACTONE) 25 MG tablet TAKE 1 TABLET BY MOUTH EVERY DAY 90 tablet 1   traZODone (DESYREL) 100 MG tablet Take 1  tablet (100 mg total) by mouth at bedtime. 90 tablet 1   venlafaxine XR (EFFEXOR-XR) 75 MG 24 hr capsule TAKE 3 CAPSULES BY MOUTH EVERY MORNING WITH BREAKFAST 90 capsule 2   Vitamin D, Ergocalciferol, (DRISDOL) 1.25 MG (50000 UNIT) CAPS capsule Take 1 capsule (50,000 Units total) by mouth every 7 (seven) days. Take for 8 total doses(weeks) (Patient not taking: Reported on 07/11/2022) 8 capsule 0   No current facility-administered medications on file prior to visit.    ALLERGIES: Allergies  Allergen Reactions   Ibuprofen Other (See Comments)    Can not take with current medications .   Tylenol [Acetaminophen] Other (See Comments)    Pt stated tylenol gives her extreme headache    FAMILY  HISTORY: Family History  Problem Relation Age of Onset   Cancer Mother        type unknown   Hypertension Mother    Hypertension Sister    Diabetes Sister    Cancer Maternal Aunt        4 aunts died of cancer types unknown      Objective:  Blood pressure 113/65, pulse (!) 57, height 5\' 6"  (1.676 m), weight 168 lb (76.2 kg), last menstrual period 03/19/2016, SpO2 98 %. General: No acute distress.  Patient appears well-groomed.   Head:  Normocephalic/atraumatic Eyes:  Fundi examined but not visualized Neck: supple, no paraspinal tenderness, full range of motion Heart:  Regular rate and rhythm Neurological Exam: alert and oriented.  Speech fluent and not dysarthric, language intact.  Decreased left facial sensation.  Otherwise, CN II-XII intact. Muscle strength 5-/5 left deltoid and biceps, 4+/5 left triceps, hand grip and left lower extremity.  5/5 right upper and lower extremities.  Mildly reduced sensation to light touch on left.  Deep tendon reflexes 3+ throughout, slightly more brisk on left.  Finger to nose testing with left ataxia.  Left hemiparetic gait.  Romberg with sway.  Ambulates with 4 prong cane   Shon Millet, DO  CC: Bertram Denver, NP

## 2022-10-30 ENCOUNTER — Other Ambulatory Visit: Payer: Self-pay | Admitting: Family Medicine

## 2022-10-30 DIAGNOSIS — I10 Essential (primary) hypertension: Secondary | ICD-10-CM

## 2022-10-31 ENCOUNTER — Ambulatory Visit (INDEPENDENT_AMBULATORY_CARE_PROVIDER_SITE_OTHER): Payer: 59 | Admitting: Neurology

## 2022-10-31 ENCOUNTER — Encounter: Payer: Self-pay | Admitting: Neurology

## 2022-10-31 VITALS — BP 113/65 | HR 57 | Ht 66.0 in | Wt 168.0 lb

## 2022-10-31 DIAGNOSIS — G43009 Migraine without aura, not intractable, without status migrainosus: Secondary | ICD-10-CM

## 2022-10-31 DIAGNOSIS — R296 Repeated falls: Secondary | ICD-10-CM | POA: Diagnosis not present

## 2022-10-31 DIAGNOSIS — I69354 Hemiplegia and hemiparesis following cerebral infarction affecting left non-dominant side: Secondary | ICD-10-CM

## 2022-10-31 DIAGNOSIS — R55 Syncope and collapse: Secondary | ICD-10-CM

## 2022-10-31 MED ORDER — FLURBIPROFEN 100 MG PO TABS: ORAL_TABLET | ORAL | 5 refills | Status: DC

## 2022-10-31 MED ORDER — QULIPTA 60 MG PO TABS: 1.0000 | ORAL_TABLET | Freq: Every day | ORAL | 11 refills | Status: DC

## 2022-10-31 NOTE — Patient Instructions (Signed)
Qulipta 60mg  daily refilled Flurbiprofen as needed for migraines refilled.  Limit use of pain relievers to no more than 2 days out of week to prevent risk of rebound or medication-overuse headache. Keep headache diary

## 2022-11-05 DIAGNOSIS — I639 Cerebral infarction, unspecified: Secondary | ICD-10-CM | POA: Diagnosis not present

## 2022-11-06 NOTE — Telephone Encounter (Signed)
I will find out from the provider when she returns if the forms have been faxed.

## 2022-11-06 NOTE — Telephone Encounter (Signed)
[facility] with [facility] is calling back stating that they did not receive the certificate of medical necessity and physicians order for incontinence supplies. Please re fax back to  [phone].

## 2022-11-11 ENCOUNTER — Telehealth: Payer: Self-pay

## 2022-11-11 NOTE — Telephone Encounter (Signed)
Return call unanswered.  Message left that orders had been faxed.

## 2022-11-11 NOTE — Telephone Encounter (Signed)
Copied from CRM [id number]. Topic: General - Other >> Nov 10, 2022 11:42 AM Franchot Heidelberg wrote: Reason for CRM: Pt is adamant about her incontinence supplies. She is demanding to receive her pull ups today, since she he has been out for over a month   Best contact: [phone]

## 2022-11-14 DIAGNOSIS — I639 Cerebral infarction, unspecified: Secondary | ICD-10-CM | POA: Diagnosis not present

## 2022-11-19 NOTE — Telephone Encounter (Signed)
The patient called in stating she wants nothing to do anymore with [facility]. She is only dealing with [facility] at this point and she is checking on the status of paperwork that [facility] faxed over yesterday regarding incontinence supplies. Please assist patient as soon as possible.

## 2022-11-20 NOTE — Telephone Encounter (Signed)
Okay, if it was faxed to [place] then it will get signed when Z does her paperwork.

## 2022-11-20 NOTE — Telephone Encounter (Signed)
Thank you :)

## 2022-11-25 ENCOUNTER — Telehealth: Payer: Self-pay | Admitting: Nurse Practitioner

## 2022-11-25 NOTE — Telephone Encounter (Signed)
Patient has stated that she is no longer want to have any business handled with this company.

## 2022-11-25 NOTE — Telephone Encounter (Signed)
Copied from CRM [id number]. Topic: General - Other >> Nov 25, 2022  8:57 AM Everette C wrote: Reason for CRM: Stephanie with [facility] has called to follow up on previous submission of a request for incontinence supplies faxed to the practice on 11/10/22 with no response   Please contact further when possible

## 2022-11-26 ENCOUNTER — Telehealth: Payer: Self-pay | Admitting: Nurse Practitioner

## 2022-11-26 NOTE — Telephone Encounter (Signed)
Unable to reach patient by phone to relay results.  Order confirmed.

## 2022-11-26 NOTE — Telephone Encounter (Signed)
Copied from CRM [id number]. Topic: General - Other >> Nov 26, 2022 10:38 AM Everette C wrote: Reason for CRM: The patient has called to confirm submission of their order to [facility]   Please contact further when possible

## 2022-11-27 NOTE — Telephone Encounter (Signed)
Forms faxed to [facility]

## 2022-11-27 NOTE — Telephone Encounter (Signed)
Pt called in I gave her info of order confirmed for diapers. She wants to know when she will receive them. Please call back

## 2022-11-27 NOTE — Telephone Encounter (Signed)
Spoke with patient . Verified name & DOB   Advised patient that form was faxed to [facility] and order was confirmed. Someone tried to reach out to her yesterday,  put was unable to contact her. Patient  wanted to know when the supplies were gong to be delivered. Advised patient the call [facility]  to get a estimated time of a arrival.

## 2022-11-27 NOTE — Telephone Encounter (Signed)
Pt stated she just got off the phone with [facility], and they advised her they had not received anything from Ms. Fleming's office.  Please advise.

## 2022-12-08 ENCOUNTER — Other Ambulatory Visit: Payer: Self-pay | Admitting: Neurology

## 2022-12-08 ENCOUNTER — Other Ambulatory Visit: Payer: Self-pay | Admitting: Family Medicine

## 2022-12-08 DIAGNOSIS — I10 Essential (primary) hypertension: Secondary | ICD-10-CM

## 2022-12-12 ENCOUNTER — Ambulatory Visit: Payer: 59 | Attending: Nurse Practitioner | Admitting: Nurse Practitioner

## 2022-12-12 ENCOUNTER — Encounter: Payer: Self-pay | Admitting: Nurse Practitioner

## 2022-12-12 VITALS — BP 124/71 | HR 69 | Ht 66.0 in | Wt 160.0 lb

## 2022-12-12 DIAGNOSIS — E1165 Type 2 diabetes mellitus with hyperglycemia: Secondary | ICD-10-CM | POA: Diagnosis not present

## 2022-12-12 DIAGNOSIS — E559 Vitamin D deficiency, unspecified: Secondary | ICD-10-CM

## 2022-12-12 DIAGNOSIS — I10 Essential (primary) hypertension: Secondary | ICD-10-CM

## 2022-12-12 DIAGNOSIS — R634 Abnormal weight loss: Secondary | ICD-10-CM

## 2022-12-12 MED ORDER — NITROGLYCERIN 0.4 MG SL SUBL: 0.4000 mg | SUBLINGUAL_TABLET | SUBLINGUAL | 0 refills | Status: DC | PRN

## 2022-12-12 MED ORDER — METOPROLOL TARTRATE 25 MG PO TABS
25.0000 mg | ORAL_TABLET | Freq: Two times a day (BID) | ORAL | 1 refills | Status: DC
Start: 2022-12-12 — End: 2023-12-02

## 2022-12-12 MED ORDER — AMLODIPINE BESYLATE 10 MG PO TABS
10.0000 mg | ORAL_TABLET | Freq: Every day | ORAL | 1 refills | Status: DC
Start: 2022-12-12 — End: 2023-05-13

## 2022-12-12 MED ORDER — VITAMIN D (ERGOCALCIFEROL) 1.25 MG (50000 UNIT) PO CAPS: 50000.0000 [IU] | ORAL_CAPSULE | ORAL | 0 refills | Status: DC

## 2022-12-12 NOTE — Progress Notes (Signed)
Assessment & Plan:  Brandy Roberts was seen today for hypertension.  Diagnoses and all orders for this visit:  Primary hypertension Well controlled -     CMP14+EGFR -     metoprolol tartrate (LOPRESSOR) 25 MG tablet; Take 1 tablet (25 mg total) by mouth 2 (two) times daily. -     amLODipine (NORVASC) 10 MG tablet; Take 1 tablet (10 mg total) by mouth daily. TAKE 1 TABLET BY MOUTH EVERY DAY FOR BLOOD PRESSURE Continue all antihypertensives as prescribed.  Reminded to bring in blood pressure log for follow  up appointment.  RECOMMENDATIONS: DASH/Mediterranean Diets are healthier choices for HTN.    Type 2 diabetes mellitus with hyperglycemia, without long-term current use of insulin [name] Continue blood sugar control as discussed in office today, low carbohydrate diet, and regular physical exercise as tolerated, 150 minutes per week (30 min each day, 5 days per week, or 50 min 3 days per week). Keep blood sugar logs with fasting goal of 90-130 mg/dl, post prandial (after you eat) less than 180.  For Hypoglycemia: BS <60 and Hyperglycemia BS >400; contact the clinic ASAP. Annual eye exams and foot exams are recommended.  -     Hemoglobin A1c  Unintentional weight loss -     DG Chest 2 View; Future -     Fecal occult blood, imunochemical([facility]) -     Sedimentation Rate -     C-reactive protein  Other orders -     nitroGLYCERIN (NITROSTAT) 0.4 MG SL tablet; Place 1 tablet (0.4 mg total) under the tongue every 5 (five) minutes as needed for chest pain.    Patient has been counseled on age-appropriate routine health concerns for screening and prevention. These are reviewed and up-to-date. Referrals have been placed accordingly. Immunizations are up-to-date or declined.    Subjective:   Chief Complaint  Patient presents with   Hypertension   HPI Brandy Roberts 52 y.o. female presents to office today for follow up to HTN.  She has a past medical history of Anemia, Anxiety,  Aortic regurgitation, Cardiomyopathy due to hypertension (10/23/2014), Chronic ischemic vertebrobasilar artery thalamic stroke, Depression, Essential hypertension (05/02/2015), Former smoker (01/24/2015), H/O noncompliance with medical treatment, presenting hazards to health, Headache (05/01/2015), History of ischemic middle cerebral artery stroke embolic, Hyperlipidemia, Hypertension, Hypertensive heart disease, Migraine (05/21/2018), Migraines, Nonrheumatic aortic (valve) insufficiency, Osteoarthritis, Overweight (BMI 25.0-29.9), Palpitations (10/12/2018), Sleep apnea, Snoring (05/02/2015), Stroke (10/25/2016), and Syncope and collapse (10/07/2018).       HTN Blood pressure is well controlled with amlodipine 10 mg daily, hydralazine 50 mg TID, lisinopril 10mg  daily, lopressor 25 mg BID, and spirinolactone 25 mg daily as prescribed BP Readings from Last 3 Encounters:  12/12/22 124/71  10/31/22 113/65  07/11/22 126/71     She is concerned about unintentional weight loss. She is a former smoker and states she does not drink alcohol. Denies chronic cough. She does smoke marijuana. Weight is down 10lbs since August. She is also taking qulipta which could be contributing to decreased appetite. She is requesting BOOST supplements however she is not currently under CAP services with Medicaid so she does not meet criteria for this.       Review of Systems  Constitutional:  Positive for weight loss. Negative for fever and malaise/fatigue.  HENT: Negative.  Negative for nosebleeds.   Eyes: Negative.  Negative for blurred vision, double vision and photophobia.  Respiratory: Negative.  Negative for cough and shortness of breath.   Cardiovascular: Negative.  Negative for  chest pain, palpitations and leg swelling.  Gastrointestinal: Negative.  Negative for heartburn, nausea and vomiting.  Musculoskeletal: Negative.  Negative for myalgias.  Neurological: Negative.  Negative for dizziness, focal weakness,  seizures and headaches.  Psychiatric/Behavioral: Negative.  Negative for suicidal ideas.     Past Medical History:  Diagnosis Date   Anemia    Anxiety    Aortic regurgitation    Cardiomyopathy due to hypertension [name] 10/23/2014   Chest discomfort 10/12/2018   Chronic ischemic vertebrobasilar artery thalamic stroke    Depression    Essential hypertension 05/02/2015   Facet arthropathy 05/15/2021   Former smoker 01/24/2015   H/O noncompliance with medical treatment, presenting hazards to health    Headache 05/01/2015   History of ischemic middle cerebral artery stroke embolic    Hyperlipidemia    Hypertension    Hypertensive heart disease    Lumbar radiculopathy 05/01/2021   Migraine 05/21/2018   Migraines    Mixed dyslipidemia 11/26/2020   Nonrheumatic aortic (valve) insufficiency    Obesity (BMI 35.0-39.9 without comorbidity) 11/26/2020   Osteoarthritis    Overweight (BMI 25.0-29.9)    Palpitations 10/12/2018   SI joint arthritis 05/15/2021   Sleep apnea    Snoring 05/02/2015   Stroke [name] 10/25/2016   Syncope and collapse 10/07/2018   Vitamin D deficiency 05/15/2021    Past Surgical History:  Procedure Laterality Date   CESAREAN SECTION     GANGLION CYST EXCISION     TEE WITHOUT CARDIOVERSION N/A 10/09/2014   Procedure: TRANSESOPHAGEAL ECHOCARDIOGRAM (TEE);  Surgeon: Thurmon Fair, MD;  Location: [facility] ENDOSCOPY;  Service: Cardiovascular;  Laterality: N/A;   TONSILLECTOMY      Family History  Problem Relation Age of Onset   Cancer Mother        type unknown   Hypertension Mother    Hypertension Sister    Diabetes Sister    Cancer Maternal Aunt        4 aunts died of cancer types unknown    Social History Reviewed with no changes to be made today.   Outpatient Medications Prior to Visit  Medication Sig Dispense Refill   Atogepant (QULIPTA) 60 MG TABS Take 1 tablet (60 mg total) by mouth daily. 30 tablet 11   atorvastatin (LIPITOR) 80 MG tablet TAKE 1 TABLET BY  MOUTH EVERY DAY AT 6PM FOR CHOLESTEROL 90 tablet 1   busPIRone (BUSPAR) 10 MG tablet Take 1 tablet (10 mg total) by mouth 3 (three) times daily. 90 tablet 3   clopidogrel (PLAVIX) 75 MG tablet Take 1 tablet (75 mg total) by mouth daily. 90 tablet 3   flurbiprofen (ANSAID) 100 MG tablet TAKE 1 TABLET BY MOUTH EVERY 8 HOURS AS NEEDED (MAX OF 3 TABLETS A DAY) 30 tablet 5   hydrALAZINE (APRESOLINE) 50 MG tablet TAKE 1 TABLET BY MOUTH 3 TIMES DAILY 270 tablet 1   hydrochlorothiazide (MICROZIDE) 12.5 MG capsule TAKE 1 CAPSULE BY MOUTH EVERY DAY 90 capsule 1   HYDROcodone-acetaminophen (NORCO/VICODIN) 5-325 MG tablet Take 1 tablet by mouth every 8 (eight) hours as needed. 15 tablet 0   hydrOXYzine (ATARAX/VISTARIL) 50 MG tablet Take 50 mg by mouth 3 (three) times daily as needed for anxiety or sleep.     methocarbamol (ROBAXIN) 500 MG tablet Take 1 tablet (500 mg total) by mouth 2 (two) times daily. 20 tablet 0   metoCLOPramide (REGLAN) 10 MG tablet Take 10 mg by mouth every 6 (six) hours as needed for nausea or headache.  Misc. Devices MISC Incontinent supplies 1 each 0   pantoprazole (PROTONIX) 20 MG tablet TAKE 1 TABLET BY MOUTH EVERY DAY 90 tablet 0   spironolactone (ALDACTONE) 25 MG tablet TAKE 1 TABLET BY MOUTH EVERY DAY 90 tablet 1   traZODone (DESYREL) 100 MG tablet Take 1 tablet (100 mg total) by mouth at bedtime. 90 tablet 1   venlafaxine XR (EFFEXOR-XR) 75 MG 24 hr capsule TAKE 3 CAPSULES BY MOUTH EVERY MORNING WITH BREAKFAST 90 capsule 2   amLODipine (NORVASC) 10 MG tablet TAKE 1 TABLET BY MOUTH EVERY DAY FOR BLOOD PRESSURE 30 tablet 1   Galcanezumab-gnlm 120 MG/ML SOAJ Inject 120 mg into the skin every 30 (thirty) days. 1.12 mL 0   metoprolol tartrate (LOPRESSOR) 25 MG tablet TAKE 1 TABLET BY MOUTH 2 TIMES A DAY 60 tablet 1   Elastic Bandages & Supports (POST-OP SHOE/SOFT TOP WOMEN) MISC 1 each by Does not apply route daily as needed. (Patient not taking: Reported on 12/12/2022) 1 each 0    lisinopril (ZESTRIL) 10 MG tablet Take 1 tablet (10 mg total) by mouth daily. (Patient not taking: Reported on 12/12/2022) 90 tablet 1   mupirocin ointment (BACTROBAN) 2 % Apply 1 Application topically 2 (two) times daily. (Patient not taking: Reported on 12/12/2022) 100 g 3   Vitamin D, Ergocalciferol, (DRISDOL) 1.25 MG (50000 UNIT) CAPS capsule Take 1 capsule (50,000 Units total) by mouth every 7 (seven) days. Take for 8 total doses(weeks) (Patient not taking: Reported on 12/12/2022) 8 capsule 0   nitroGLYCERIN (NITROSTAT) 0.4 MG SL tablet Place 0.4 mg under the tongue every 5 (five) minutes as needed for chest pain. (Patient not taking: Reported on 12/12/2022)     No facility-administered medications prior to visit.    Allergies  Allergen Reactions   Ibuprofen Other (See Comments)    Can not take with current medications .   Tylenol [Acetaminophen] Other (See Comments)    Pt stated tylenol gives her extreme headache       Objective:    BP 124/71   Pulse 69   Ht 5\' 6"  (1.676 m)   Wt 160 lb (72.6 kg)   LMP 03/19/2016   SpO2 99%   BMI 25.82 kg/m  Wt Readings from Last 3 Encounters:  12/12/22 160 lb (72.6 kg)  10/31/22 168 lb (76.2 kg)  07/11/22 170 lb 3.2 oz (77.2 kg)    Physical Exam Vitals and nursing note reviewed.  Constitutional:      Appearance: She is well-developed.  HENT:     Head: Normocephalic and atraumatic.  Cardiovascular:     Rate and Rhythm: Normal rate and regular rhythm.     Heart sounds: Normal heart sounds. No murmur heard.    No friction rub. No gallop.  Pulmonary:     Effort: Pulmonary effort is normal. No tachypnea or respiratory distress.     Breath sounds: Normal breath sounds. No decreased breath sounds, wheezing, rhonchi or rales.  Chest:     Chest wall: No tenderness.  Abdominal:     General: Bowel sounds are normal.     Palpations: Abdomen is soft.  Musculoskeletal:        General: Normal range of motion.     Cervical back: Normal range  of motion.  Skin:    General: Skin is warm and dry.  Neurological:     Mental Status: She is alert and oriented to person, place, and time.     Coordination: Coordination normal.  Psychiatric:  Behavior: Behavior normal. Behavior is cooperative.        Thought Content: Thought content normal.        Judgment: Judgment normal.          Patient has been counseled extensively about nutrition and exercise as well as the importance of adherence with medications and regular follow-up. The patient was given clear instructions to go to ER or return to medical center if symptoms don't improve, worsen or new problems develop. The patient verbalized understanding.   Follow-up: Return in about 3 months (around 03/14/2023) for PAP SMEAR.   Claiborne Rigg, FNP-BC [facility] [place], [place] [phone]   12/12/2022, 9:51 AM

## 2022-12-13 LAB — CMP14+EGFR
ALT: 23 IU/L (ref 0–32)
AST: 20 IU/L (ref 0–40)
Albumin: 4.6 g/dL (ref 3.8–4.9)
Alkaline Phosphatase: 128 IU/L — ABNORMAL HIGH (ref 44–121)
BUN/Creatinine Ratio: 18 (ref 9–23)
BUN: 19 mg/dL (ref 6–24)
Bilirubin Total: 0.5 mg/dL (ref 0.0–1.2)
CO2: 27 mmol/L (ref 20–29)
Calcium: 9.8 mg/dL (ref 8.7–10.2)
Chloride: 101 mmol/L (ref 96–106)
Creatinine, Ser: 1.05 mg/dL — ABNORMAL HIGH (ref 0.57–1.00)
Globulin, Total: 3.3 g/dL (ref 1.5–4.5)
Glucose: 111 mg/dL — ABNORMAL HIGH (ref 70–99)
Potassium: 3.3 mmol/L — ABNORMAL LOW (ref 3.5–5.2)
Sodium: 143 mmol/L (ref 134–144)
Total Protein: 7.9 g/dL (ref 6.0–8.5)
eGFR: 64 mL/min/{1.73_m2} (ref >59)

## 2022-12-13 LAB — HIV ANTIBODY (ROUTINE TESTING W REFLEX): HIV Screen 4th Generation wRfx: NONREACTIVE

## 2022-12-13 LAB — MICROALBUMIN / CREATININE URINE RATIO
Creatinine, Urine: 180.4 mg/dL
Microalb/Creat Ratio: 13 mg/g creat (ref 0–29)
Microalbumin, Urine: 22.8 ug/mL

## 2022-12-13 LAB — SEDIMENTATION RATE: Sed Rate: 11 mm/hr (ref 0–40)

## 2022-12-13 LAB — HEMOGLOBIN A1C
Est. average glucose Bld gHb Est-mCnc: 117 mg/dL
Hgb A1c MFr Bld: 5.7 % — ABNORMAL HIGH (ref 4.8–5.6)

## 2022-12-13 LAB — VITAMIN D 25 HYDROXY (VIT D DEFICIENCY, FRACTURES): Vit D, 25-Hydroxy: 32.4 ng/mL (ref 30.0–100.0)

## 2022-12-13 LAB — C-REACTIVE PROTEIN: CRP: 12 mg/L — ABNORMAL HIGH (ref 0–10)

## 2022-12-17 ENCOUNTER — Telehealth: Payer: Self-pay | Admitting: *Deleted

## 2022-12-17 ENCOUNTER — Telehealth: Payer: Self-pay

## 2022-12-17 DIAGNOSIS — R634 Abnormal weight loss: Secondary | ICD-10-CM

## 2022-12-17 NOTE — Progress Notes (Signed)
  Care Coordination  Outreach Note  12/17/2022 Name: Mishal Probert MRN: [id number] DOB: April 19, 1971   Care Coordination Outreach Attempts: An unsuccessful telephone outreach was attempted today to offer the patient information about available care coordination services.  Follow Up Plan:  Additional outreach attempts will be made to offer the patient care coordination information and services.   Encounter Outcome:  No Answer  Burman Nieves, CCMA Care Coordination Care Guide Direct Dial: [phone]

## 2022-12-17 NOTE — Telephone Encounter (Signed)
At request of PCP, I called the patient about the CAP program and explained to her that in order to qualify for the services, her medical conditions are chronic and severe enough to meet an institutional level of care  and I explained what that meant.  She said she does not need that much care.  I asked her why she was requesting CAP and she said she wants to get her Boost for free.  I told her that will not qualify her for CAP and she said okay, she will keep paying for it. I offered to give her the phone number for [employer]/ CAP to call with any questions and she said she will hold off for now and will let us know if she changes her mind.    I told her that I can refer her to [facility] care management to see if her insurance will pay for any of the Boost as an extra benefit and she was in agreement.

## 2022-12-18 NOTE — Progress Notes (Signed)
  Care Coordination  Outreach Note  12/18/2022 Name: Brandy Roberts MRN: [id number] DOB: 12/26/1970   Care Coordination Outreach Attempts: A second unsuccessful outreach was attempted today to offer the patient with information about available care coordination services.  Follow Up Plan:  Additional outreach attempts will be made to offer the patient care coordination information and services.   Encounter Outcome:  No Answer  Burman Nieves, CCMA Care Coordination Care Guide Direct Dial: [phone]

## 2022-12-19 NOTE — Progress Notes (Signed)
  Care Coordination  Outreach Note  12/19/2022 Name: Brandy Roberts MRN: [id number] DOB: [birth year]   Care Coordination Outreach Attempts: A third unsuccessful outreach was attempted today to offer the patient with information about available care coordination services.  Follow Up Plan:  No further outreach attempts will be made at this time. We have been unable to contact the patient to offer or enroll patient in care coordination services  Encounter Outcome:  No Answer  Burman Nieves, [facility] Care Coordination Care Guide Direct Dial: [phone]

## 2023-01-06 ENCOUNTER — Other Ambulatory Visit: Payer: Self-pay | Admitting: Family Medicine

## 2023-01-06 DIAGNOSIS — K2100 Gastro-esophageal reflux disease with esophagitis, without bleeding: Secondary | ICD-10-CM

## 2023-01-30 ENCOUNTER — Ambulatory Visit: Payer: 59 | Admitting: Cardiology

## 2023-02-10 ENCOUNTER — Other Ambulatory Visit: Payer: Self-pay | Admitting: Family Medicine

## 2023-02-10 DIAGNOSIS — I10 Essential (primary) hypertension: Secondary | ICD-10-CM

## 2023-02-18 ENCOUNTER — Encounter: Payer: Self-pay | Admitting: Cardiology

## 2023-02-18 ENCOUNTER — Ambulatory Visit: Payer: 59 | Attending: Cardiology | Admitting: Cardiology

## 2023-02-18 VITALS — BP 116/60 | HR 65 | Ht 66.0 in | Wt 176.1 lb

## 2023-02-18 DIAGNOSIS — F17210 Nicotine dependence, cigarettes, uncomplicated: Secondary | ICD-10-CM | POA: Insufficient documentation

## 2023-02-18 DIAGNOSIS — I10 Essential (primary) hypertension: Secondary | ICD-10-CM

## 2023-02-18 DIAGNOSIS — I351 Nonrheumatic aortic (valve) insufficiency: Secondary | ICD-10-CM

## 2023-02-18 DIAGNOSIS — E782 Mixed hyperlipidemia: Secondary | ICD-10-CM

## 2023-02-18 HISTORY — DX: Nicotine dependence, cigarettes, uncomplicated: F17.210

## 2023-02-18 NOTE — Progress Notes (Signed)
Cardiology Office Note:    Date:  02/18/2023   ID:  Brandy Roberts, DOB 12/22/1970, MRN [id number]  PCP:  Claiborne Rigg, NP  Cardiologist:  Garwin Brothers, MD   Referring MD: Claiborne Rigg, NP    ASSESSMENT:    1. Essential hypertension   2. Mixed hyperlipidemia   3. Aortic valve insufficiency, etiology of cardiac valve disease unspecified   4. Cigarette smoker    PLAN:    In order of problems listed above:  Primary prevention stressed with the patient.  Importance of compliance with diet medication stressed and patient verbalized standing. Cigarette smoker: I spent 5 minutes with the patient discussing solely about smoking. Smoking cessation was counseled. I suggested to the patient also different medications and pharmacological interventions. Patient is keen to try stopping on its own at this time. He will get back to me if he needs any further assistance in this matter. Essential hypertension: Blood pressure stable and diet was emphasized.  Lifestyle modification urged Mixed dyslipidemia: On lipid-lowering medications followed by primary care.  Lipids reviewed from [facility] sheet and discussed with the patient. History of stroke: Medical management at this time. Patient will be seen in follow-up appointment in 6 months or earlier if the patient has any concerns.    Medication Adjustments/Labs and Tests Ordered: Current medicines are reviewed at length with the patient today.  Concerns regarding medicines are outlined above.  Orders Placed This Encounter  Procedures   EKG 12-Lead   ECHOCARDIOGRAM COMPLETE   No orders of the defined types were placed in this encounter.    No chief complaint on file.    History of Present Illness:    Brandy Roberts is a 52 y.o. female.  Patient has past medical history of essential hypertension, dyslipidemia, aortic regurgitation and history of stroke.  Unfortunately she tells me that she now smokes on a regular basis.  She  ambulates with a cane.  No chest pain orthopnea or PND.  At the time of my evaluation, the patient is alert awake oriented and in no distress.  Past Medical History:  Diagnosis Date   Anemia    Anxiety    Aortic regurgitation    Cardiomyopathy due to hypertension [name] 10/23/2014   Chest discomfort 10/12/2018   Chronic ischemic vertebrobasilar artery thalamic stroke    Depression    Essential hypertension 05/02/2015   Facet arthropathy 05/15/2021   Former smoker 01/24/2015   H/O noncompliance with medical treatment, presenting hazards to health    Headache 05/01/2015   Hemiparesis affecting left side as late effect of cerebrovascular accident [name] 07/11/2022   History of ischemic middle cerebral artery stroke embolic    Hyperlipidemia    Hypertension    Hypertensive heart disease    Lumbar radiculopathy 05/01/2021   Migraine 05/21/2018   Migraines    Mixed dyslipidemia 11/26/2020   Nonrheumatic aortic (valve) insufficiency    Obesity (BMI 35.0-39.9 without comorbidity) 11/26/2020   Osteoarthritis    Overweight (BMI 25.0-29.9)    Palpitations 10/12/2018   SI joint arthritis 05/15/2021   Sleep apnea    Snoring 05/02/2015   Stroke [name] 10/25/2016   Syncope and collapse 10/07/2018   Vitamin D deficiency 05/15/2021    Past Surgical History:  Procedure Laterality Date   CESAREAN SECTION     GANGLION CYST EXCISION     TEE WITHOUT CARDIOVERSION N/A 10/09/2014   Procedure: TRANSESOPHAGEAL ECHOCARDIOGRAM (TEE);  Surgeon: Thurmon Fair, MD;  Location: [facility] ENDOSCOPY;  Service: Cardiovascular;  Laterality: N/A;   TONSILLECTOMY      Current Medications: Current Meds  Medication Sig   amLODipine (NORVASC) 10 MG tablet Take 1 tablet (10 mg total) by mouth daily. TAKE 1 TABLET BY MOUTH EVERY DAY FOR BLOOD PRESSURE   Atogepant (QULIPTA) 60 MG TABS Take 1 tablet (60 mg total) by mouth daily.   atorvastatin (LIPITOR) 80 MG tablet TAKE 1 TABLET BY MOUTH EVERY DAY AT 6PM FOR CHOLESTEROL    busPIRone (BUSPAR) 10 MG tablet Take 1 tablet (10 mg total) by mouth 3 (three) times daily.   clopidogrel (PLAVIX) 75 MG tablet Take 1 tablet (75 mg total) by mouth daily.   Elastic Bandages & Supports (POST-OP SHOE/SOFT TOP WOMEN) MISC 1 each by Does not apply route daily as needed.   flurbiprofen (ANSAID) 100 MG tablet TAKE 1 TABLET BY MOUTH EVERY 8 HOURS AS NEEDED (MAX OF 3 TABLETS A DAY)   hydrALAZINE (APRESOLINE) 50 MG tablet TAKE 1 TABLET BY MOUTH 3 TIMES DAILY   hydrochlorothiazide (MICROZIDE) 12.5 MG capsule TAKE 1 CAPSULE BY MOUTH EVERY DAY   HYDROcodone-acetaminophen (NORCO/VICODIN) 5-325 MG tablet Take 1 tablet by mouth every 8 (eight) hours as needed.   hydrOXYzine (ATARAX/VISTARIL) 50 MG tablet Take 50 mg by mouth 3 (three) times daily as needed for anxiety or sleep.   lisinopril (ZESTRIL) 10 MG tablet Take 1 tablet (10 mg total) by mouth daily.   methocarbamol (ROBAXIN) 500 MG tablet Take 1 tablet (500 mg total) by mouth 2 (two) times daily.   metoCLOPramide (REGLAN) 10 MG tablet Take 10 mg by mouth every 6 (six) hours as needed for nausea or headache.   metoprolol tartrate (LOPRESSOR) 25 MG tablet Take 1 tablet (25 mg total) by mouth 2 (two) times daily.   Misc. Devices MISC Incontinent supplies   mupirocin ointment (BACTROBAN) 2 % Apply 1 Application topically 2 (two) times daily.   nitroGLYCERIN (NITROSTAT) 0.4 MG SL tablet Place 1 tablet (0.4 mg total) under the tongue every 5 (five) minutes as needed for chest pain.   pantoprazole (PROTONIX) 20 MG tablet TAKE 1 TABLET BY MOUTH EVERY DAY   spironolactone (ALDACTONE) 25 MG tablet TAKE 1 TABLET BY MOUTH EVERY DAY   traZODone (DESYREL) 100 MG tablet Take 1 tablet (100 mg total) by mouth at bedtime.   venlafaxine XR (EFFEXOR-XR) 75 MG 24 hr capsule TAKE 3 CAPSULES BY MOUTH EVERY MORNING WITH BREAKFAST   Vitamin D, Ergocalciferol, (DRISDOL) 1.25 MG (50000 UNIT) CAPS capsule Take 1 capsule (50,000 Units total) by mouth every 7  (seven) days. Please fill as a 90 day supply     Allergies:   Ibuprofen and Tylenol [acetaminophen]   Social History   Socioeconomic History   Marital status: Single    Spouse name: Not on file   Number of children: 2   Years of education: 12   Highest education level: High school graduate  Occupational History   Occupation: disable   Tobacco Use   Smoking status: Former    Current packs/day: 0.00    Types: Cigarettes    Quit date: 08/31/2017    Years since quitting: 5.4   Smokeless tobacco: Never  Vaping Use   Vaping status: Never Used  Substance and Sexual Activity   Alcohol use: No    Alcohol/week: 0.0 standard drinks of alcohol   Drug use: Yes    Types: Marijuana   Sexual activity: Not on file  Other Topics Concern   Not on file  Social  History Narrative   Patient lives with her uncle in a one story home.  Has 2 sons.  Currently on disability.  Education: high school.   Drinks 1-2 sodas a week       Social Determinants of Health   Financial Resource Strain: Low Risk  (07/28/2022)   Overall Financial Resource Strain (CARDIA)    Difficulty of Paying Living Expenses: Not hard at all  Food Insecurity: Food Insecurity Present (07/28/2022)   Hunger Vital Sign    Worried About Running Out of Food in the Last Year: Sometimes true    Ran Out of Food in the Last Year: Sometimes true  Transportation Needs: No Transportation Needs (07/28/2022)   PRAPARE - Administrator, Civil Service (Medical): No    Lack of Transportation (Non-Medical): No  Physical Activity: Insufficiently Active (07/28/2022)   Exercise Vital Sign    Days of Exercise per Week: 3 days    Minutes of Exercise per Session: 10 min  Stress: No Stress Concern Present (07/28/2022)   [employer] of Occupational Health - Occupational Stress Questionnaire    Feeling of Stress : Not at all  Social Connections: Socially Isolated (07/28/2022)   Social Connection and Isolation Panel [NHANES]     Frequency of Communication with Friends and Family: More than three times a week    Frequency of Social Gatherings with Friends and Family: Three times a week    Attends Religious Services: Never    Active Member of Clubs or Organizations: No    Attends Banker Meetings: Never    Marital Status: Never married     Family History: The patient's family history includes Cancer in her maternal aunt and mother; Diabetes in her sister; Hypertension in her mother and sister.  ROS:   Please see the history of present illness.    All other systems reviewed and are negative.  EKGs/Labs/Other Studies Reviewed:    The following studies were reviewed today: I discussed my findings with the patient at length.  ..EKG Interpretation Date/Time:  Wednesday February 18 2023 10:42:48 EDT Ventricular Rate:  65 PR Interval:  158 QRS Duration:  90 QT Interval:  426 QTC Calculation: 443 R Axis:   11  Text Interpretation: Normal sinus rhythm Moderate voltage criteria for LVH, may be normal variant ( R in aVL , Cornell product ) Cannot rule out Anterior infarct , age undetermined When compared with ECG of 30-Sep-2018 15:05, PR interval has decreased Non-specific change in ST segment in Inferior leads Nonspecific T wave abnormality now evident in Anterior leads Confirmed by Belva Crome [phone]) on 02/18/2023 10:48:19 AM   Recent Labs: 12/12/2022: ALT 23; BUN 19; Creatinine, Ser 1.05; Potassium 3.3; Sodium 143  Recent Lipid Panel    Component Value Date/Time   CHOL 158 07/11/2022 0954   TRIG 89 07/11/2022 0954   HDL 45 07/11/2022 0954   CHOLHDL 3.5 07/11/2022 0954   CHOLHDL 3.3 10/25/2016 0248   VLDL 8 10/25/2016 0248   LDLCALC 96 07/11/2022 0954    Physical Exam:    VS:  BP 116/60   Pulse 65   Ht 5\' 6"  (1.676 m)   Wt 176 lb 1.3 oz (79.9 kg)   LMP 03/19/2016   SpO2 98%   BMI 28.42 kg/m     Wt Readings from Last 3 Encounters:  02/18/23 176 lb 1.3 oz (79.9 kg)  12/12/22 160  lb (72.6 kg)  10/31/22 168 lb (76.2 kg)     GEN:  Patient is in no acute distress HEENT: Normal NECK: No JVD; No carotid bruits LYMPHATICS: No lymphadenopathy CARDIAC: Hear sounds regular, 2/6 systolic murmur at the apex. RESPIRATORY:  Clear to auscultation without rales, wheezing or rhonchi  ABDOMEN: Soft, non-tender, non-distended MUSCULOSKELETAL:  No edema; No deformity  SKIN: Warm and dry NEUROLOGIC:  Alert and oriented x 3 PSYCHIATRIC:  Normal affect   Signed, Garwin Brothers, MD  02/18/2023 11:03 AM    [facility]

## 2023-02-18 NOTE — Patient Instructions (Signed)
Medication Instructions:  Your physician recommends that you continue on your current medications as directed. Please refer to the Current Medication list given to you today.  *If you need a refill on your cardiac medications before your next appointment, please call your pharmacy*   Lab Work: None ordered If you have labs (blood work) drawn today and your tests are completely normal, you will receive your results only by: MyChart Message (if you have MyChart) OR A paper copy in the mail If you have any lab test that is abnormal or we need to change your treatment, we will call you to review the results.   Testing/Procedures: Your physician has requested that you have an echocardiogram. Echocardiography is a painless test that uses sound waves to create images of your heart. It provides your doctor with information about the size and shape of your heart and how well your heart's chambers and valves are working. This procedure takes approximately one hour. There are no restrictions for this procedure. Please do NOT wear cologne, perfume, aftershave, or lotions (deodorant is allowed). Please arrive 15 minutes prior to your appointment time.     Follow-Up: At [facility], you and your health needs are our priority.  As part of our continuing mission to provide you with exceptional heart care, we have created designated Provider Care Teams.  These Care Teams include your primary Cardiologist (physician) and Advanced Practice Providers (APPs -  Physician Assistants and Nurse Practitioners) who all work together to provide you with the care you need, when you need it.  We recommend signing up for the patient portal called "MyChart".  Sign up information is provided on this After Visit Summary.  MyChart is used to connect with patients for Virtual Visits (Telemedicine).  Patients are able to view lab/test results, encounter notes, upcoming appointments, etc.  Non-urgent messages can be sent to  your provider as well.   To learn more about what you can do with MyChart, go to [email or web address].    Your next appointment:   12 month(s)  The format for your next appointment:   In Person  Provider:   Belva Crome, MD   Other Instructions Echocardiogram An echocardiogram is a test that uses sound waves (ultrasound) to produce images of the heart. Images from an echocardiogram can provide important information about: Heart size and shape. The size and thickness and movement of your heart's walls. Heart muscle function and strength. Heart valve function or if you have stenosis. Stenosis is when the heart valves are too narrow. If blood is flowing backward through the heart valves (regurgitation). A tumor or infectious growth around the heart valves. Areas of heart muscle that are not working well because of poor blood flow or injury from a heart attack. Aneurysm detection. An aneurysm is a weak or damaged part of an artery wall. The wall bulges out from the normal force of blood pumping through the body. Tell a health care provider about: Any allergies you have. All medicines you are taking, including vitamins, herbs, eye drops, creams, and over-the-counter medicines. Any blood disorders you have. Any surgeries you have had. Any medical conditions you have. Whether you are pregnant or may be pregnant. What are the risks? Generally, this is a safe test. However, problems may occur, including an allergic reaction to dye (contrast) that may be used during the test. What happens before the test? No specific preparation is needed. You may eat and drink normally. What happens during the test? You will  take off your clothes from the waist up and put on a hospital gown. Electrodes or electrocardiogram (ECG)patches may be placed on your chest. The electrodes or patches are then connected to a device that monitors your heart rate and rhythm. You will lie down on a table for an  ultrasound exam. A gel will be applied to your chest to help sound waves pass through your skin. A handheld device, called a transducer, will be pressed against your chest and moved over your heart. The transducer produces sound waves that travel to your heart and bounce back (or "echo" back) to the transducer. These sound waves will be captured in real-time and changed into images of your heart that can be viewed on a video monitor. The images will be recorded on a computer and reviewed by your health care provider. You may be asked to change positions or hold your breath for a short time. This makes it easier to get different views or better views of your heart. In some cases, you may receive contrast through an IV in one of your veins. This can improve the quality of the pictures from your heart. The procedure may vary among health care providers and hospitals.   What can I expect after the test? You may return to your normal, everyday life, including diet, activities, and medicines, unless your health care provider tells you not to do that. Follow these instructions at home: It is up to you to get the results of your test. Ask your health care provider, or the department that is doing the test, when your results will be ready. Keep all follow-up visits. This is important. Summary An echocardiogram is a test that uses sound waves (ultrasound) to produce images of the heart. Images from an echocardiogram can provide important information about the size and shape of your heart, heart muscle function, heart valve function, and other possible heart problems. You do not need to do anything to prepare before this test. You may eat and drink normally. After the echocardiogram is completed, you may return to your normal, everyday life, unless your health care provider tells you not to do that. This information is not intended to replace advice given to you by your health care provider. Make sure you  discuss any questions you have with your health care provider. Document Revised: 01/10/2020 Document Reviewed: 01/10/2020 Elsevier Patient Education  2021 Elsevier Inc.   Important Information About Sugar

## 2023-02-19 ENCOUNTER — Other Ambulatory Visit: Payer: Self-pay | Admitting: Neurology

## 2023-02-19 ENCOUNTER — Other Ambulatory Visit: Payer: Self-pay | Admitting: Family Medicine

## 2023-02-19 DIAGNOSIS — E785 Hyperlipidemia, unspecified: Secondary | ICD-10-CM

## 2023-03-03 ENCOUNTER — Other Ambulatory Visit: Payer: Self-pay | Admitting: Cardiology

## 2023-03-03 DIAGNOSIS — I10 Essential (primary) hypertension: Secondary | ICD-10-CM

## 2023-03-03 DIAGNOSIS — F17210 Nicotine dependence, cigarettes, uncomplicated: Secondary | ICD-10-CM

## 2023-03-03 DIAGNOSIS — I351 Nonrheumatic aortic (valve) insufficiency: Secondary | ICD-10-CM

## 2023-03-03 DIAGNOSIS — E782 Mixed hyperlipidemia: Secondary | ICD-10-CM

## 2023-03-10 ENCOUNTER — Other Ambulatory Visit: Payer: Self-pay | Admitting: Family Medicine

## 2023-03-10 ENCOUNTER — Ambulatory Visit: Payer: 59 | Attending: Nurse Practitioner | Admitting: Nurse Practitioner

## 2023-03-10 ENCOUNTER — Encounter: Payer: Self-pay | Admitting: Nurse Practitioner

## 2023-03-10 VITALS — BP 120/69 | HR 99 | Ht 66.0 in | Wt 180.0 lb

## 2023-03-10 DIAGNOSIS — Z0000 Encounter for general adult medical examination without abnormal findings: Secondary | ICD-10-CM

## 2023-03-10 DIAGNOSIS — E785 Hyperlipidemia, unspecified: Secondary | ICD-10-CM

## 2023-03-10 DIAGNOSIS — L739 Follicular disorder, unspecified: Secondary | ICD-10-CM

## 2023-03-10 DIAGNOSIS — I10 Essential (primary) hypertension: Secondary | ICD-10-CM

## 2023-03-10 DIAGNOSIS — Z23 Encounter for immunization: Secondary | ICD-10-CM

## 2023-03-10 MED ORDER — MUPIROCIN 2 % EX OINT
1.0000 | TOPICAL_OINTMENT | Freq: Two times a day (BID) | CUTANEOUS | 3 refills | Status: DC
Start: 2023-03-10 — End: 2023-03-10

## 2023-03-10 MED ORDER — ATORVASTATIN CALCIUM 80 MG PO TABS
ORAL_TABLET | ORAL | 1 refills | Status: AC
Start: 2023-03-10 — End: ?

## 2023-03-10 MED ORDER — MUPIROCIN 2 % EX OINT
1.0000 | TOPICAL_OINTMENT | Freq: Two times a day (BID) | CUTANEOUS | 3 refills | Status: DC
Start: 2023-03-10 — End: 2023-04-22

## 2023-03-10 MED ORDER — CHLORHEXIDINE GLUCONATE 4 % EX SOLN: Freq: Every day | CUTANEOUS | 1 refills | Status: AC | PRN

## 2023-03-10 NOTE — Progress Notes (Signed)
Assessment & Plan:  Brandy Roberts was seen today for annual exam.  Diagnoses and all orders for this visit:  Encounter for annual physical exam -     CMP14+EGFR  Dyslipidemia -     atorvastatin (LIPITOR) 80 MG tablet; TAKE 1 TABLET BY MOUTH EVERY DAY AT 6PM FOR CHOLESTEROL  Folliculitis Skin is healed today however she is requesting refills for  possibl future flare ups -     chlorhexidine (HIBICLENS) 4 % external liquid; Apply topically daily as needed. -     mupirocin ointment (BACTROBAN) 2 %; Apply 1 Application topically 2 (two) times daily.  Flu vaccine need -     Flu vaccine trivalent PF, 6mos and older(Flulaval,Afluria,Fluarix,Fluzone)    Patient has been counseled on age-appropriate routine health concerns for screening and prevention. These are reviewed and up-to-date. Referrals have been placed accordingly. Immunizations are up-to-date or declined.    Subjective:   Chief Complaint  Patient presents with   Annual Exam   HPI Brandy Roberts 52 y.o. female presents to office today for annual physical.   She has a past medical history of Anemia, Anxiety, Aortic regurgitation, Cardiomyopathy due to hypertension (10/23/2014), Chronic ischemic vertebrobasilar artery thalamic stroke, Depression, Essential hypertension (05/02/2015), Former smoker (01/24/2015), H/O noncompliance with medical treatment, presenting hazards to health, Headache (05/01/2015), History of ischemic middle cerebral artery stroke embolic, Hyperlipidemia, Hypertension, Hypertensive heart disease, Migraine (05/21/2018), Migraines, Nonrheumatic aortic (valve) insufficiency, Osteoarthritis, Overweight (BMI 25.0-29.9), Palpitations (10/12/2018), Sleep apnea, Snoring (05/02/2015), Stroke (10/25/2016), and Syncope and collapse (10/07/2018).     Review of Systems  Constitutional:  Negative for fever, malaise/fatigue and weight loss.  HENT: Negative.  Negative for nosebleeds.   Eyes: Negative.  Negative for blurred  vision, double vision and photophobia.  Respiratory: Negative.  Negative for cough and shortness of breath.   Cardiovascular: Negative.  Negative for chest pain, palpitations and leg swelling.  Gastrointestinal: Negative.  Negative for heartburn, nausea and vomiting.  Genitourinary: Negative.   Musculoskeletal: Negative.  Negative for myalgias.  Skin: Negative.   Neurological:  Positive for weakness. Negative for dizziness, focal weakness, seizures and headaches.  Endo/Heme/Allergies: Negative.   Psychiatric/Behavioral: Negative.  Negative for suicidal ideas.     Past Medical History:  Diagnosis Date   Anemia    Anxiety    Aortic regurgitation    Cardiomyopathy due to hypertension [name] 10/23/2014   Chest discomfort 10/12/2018   Chronic ischemic vertebrobasilar artery thalamic stroke    Depression    Essential hypertension 05/02/2015   Facet arthropathy 05/15/2021   Former smoker 01/24/2015   H/O noncompliance with medical treatment, presenting hazards to health    Headache 05/01/2015   Hemiparesis affecting left side as late effect of cerebrovascular accident [name] 07/11/2022   History of ischemic middle cerebral artery stroke embolic    Hyperlipidemia    Hypertension    Hypertensive heart disease    Lumbar radiculopathy 05/01/2021   Migraine 05/21/2018   Migraines    Mixed dyslipidemia 11/26/2020   Nonrheumatic aortic (valve) insufficiency    Obesity (BMI 35.0-39.9 without comorbidity) 11/26/2020   Osteoarthritis    Overweight (BMI 25.0-29.9)    Palpitations 10/12/2018   SI joint arthritis [name] 05/15/2021   Sleep apnea    Snoring 05/02/2015   Stroke [name] 10/25/2016   Syncope and collapse 10/07/2018   Vitamin D deficiency 05/15/2021    Past Surgical History:  Procedure Laterality Date   CESAREAN SECTION     GANGLION CYST EXCISION  TEE WITHOUT CARDIOVERSION N/A 10/09/2014   Procedure: TRANSESOPHAGEAL ECHOCARDIOGRAM (TEE);  Surgeon: Thurmon Fair, MD;   Location: [facility] ENDOSCOPY;  Service: Cardiovascular;  Laterality: N/A;   TONSILLECTOMY      Family History  Problem Relation Age of Onset   Cancer Mother        type unknown   Hypertension Mother    Hypertension Sister    Diabetes Sister    Cancer Maternal Aunt        4 aunts died of cancer types unknown    Social History Reviewed with no changes to be made today.   Outpatient Medications Prior to Visit  Medication Sig Dispense Refill   amLODipine (NORVASC) 10 MG tablet Take 1 tablet (10 mg total) by mouth daily. TAKE 1 TABLET BY MOUTH EVERY DAY FOR BLOOD PRESSURE 90 tablet 1   Atogepant (QULIPTA) 60 MG TABS Take 1 tablet (60 mg total) by mouth daily. 30 tablet 11   busPIRone (BUSPAR) 10 MG tablet Take 1 tablet (10 mg total) by mouth 3 (three) times daily. 90 tablet 3   clopidogrel (PLAVIX) 75 MG tablet Take 1 tablet (75 mg total) by mouth daily. 90 tablet 3   flurbiprofen (ANSAID) 100 MG tablet TAKE 1 TABLET BY MOUTH EVERY 8 HOURS AS NEEDED (NOT MORE THAN 3 TABLETS A DAY) 30 tablet 5   hydrALAZINE (APRESOLINE) 50 MG tablet TAKE 1 TABLET BY MOUTH 3 TIMES DAILY 270 tablet 1   HYDROcodone-acetaminophen (NORCO/VICODIN) 5-325 MG tablet Take 1 tablet by mouth every 8 (eight) hours as needed. 15 tablet 0   hydrOXYzine (ATARAX/VISTARIL) 50 MG tablet Take 50 mg by mouth 3 (three) times daily as needed for anxiety or sleep.     lisinopril (ZESTRIL) 10 MG tablet Take 1 tablet (10 mg total) by mouth daily. 90 tablet 1   metoCLOPramide (REGLAN) 10 MG tablet Take 10 mg by mouth every 6 (six) hours as needed for nausea or headache.     metoprolol tartrate (LOPRESSOR) 25 MG tablet Take 1 tablet (25 mg total) by mouth 2 (two) times daily. 180 tablet 1   Misc. Devices MISC Incontinent supplies 1 each 0   nitroGLYCERIN (NITROSTAT) 0.4 MG SL tablet Place 1 tablet (0.4 mg total) under the tongue every 5 (five) minutes as needed for chest pain. 30 tablet 0   pantoprazole (PROTONIX) 20 MG tablet TAKE 1 TABLET  BY MOUTH EVERY DAY 90 tablet 1   spironolactone (ALDACTONE) 25 MG tablet TAKE 1 TABLET BY MOUTH EVERY DAY 90 tablet 1   traZODone (DESYREL) 100 MG tablet Take 1 tablet (100 mg total) by mouth at bedtime. 90 tablet 1   venlafaxine XR (EFFEXOR-XR) 75 MG 24 hr capsule TAKE 3 CAPSULES BY MOUTH EVERY MORNING WITH BREAKFAST 90 capsule 2   atorvastatin (LIPITOR) 80 MG tablet TAKE 1 TABLET BY MOUTH EVERY DAY AT 6PM FOR CHOLESTEROL 90 tablet 0   hydrochlorothiazide (MICROZIDE) 12.5 MG capsule TAKE 1 CAPSULE BY MOUTH EVERY DAY 90 capsule 1   methocarbamol (ROBAXIN) 500 MG tablet Take 1 tablet (500 mg total) by mouth 2 (two) times daily. 20 tablet 0   mupirocin ointment (BACTROBAN) 2 % Apply 1 Application topically 2 (two) times daily. 100 g 3   Elastic Bandages & Supports (POST-OP SHOE/SOFT TOP WOMEN) MISC 1 each by Does not apply route daily as needed. (Patient not taking: Reported on 03/10/2023) 1 each 0   Vitamin D, Ergocalciferol, (DRISDOL) 1.25 MG (50000 UNIT) CAPS capsule Take 1 capsule (50,000  Units total) by mouth every 7 (seven) days. Please fill as a 90 day supply (Patient not taking: Reported on 03/10/2023) 12 capsule 0   No facility-administered medications prior to visit.    Allergies  Allergen Reactions   Ibuprofen Other (See Comments)    Can not take with current medications .   Tylenol [Acetaminophen] Other (See Comments)    Pt stated tylenol gives her extreme headache       Objective:    BP 120/69 (BP Location: Left Arm, Patient Position: Sitting, Cuff Size: Normal)   Pulse 99   Ht 5\' 6"  (1.676 m)   Wt 180 lb (81.6 kg)   LMP 03/19/2016   SpO2 98%   BMI 29.05 kg/m  Wt Readings from Last 3 Encounters:  03/10/23 180 lb (81.6 kg)  02/18/23 176 lb 1.3 oz (79.9 kg)  12/12/22 160 lb (72.6 kg)    Physical Exam Constitutional:      Appearance: She is well-developed.  HENT:     Head: Normocephalic and atraumatic.     Right Ear: Hearing, tympanic membrane, ear canal and  external ear normal.     Left Ear: Hearing, tympanic membrane, ear canal and external ear normal.     Nose: Nose normal.     Right Turbinates: Not enlarged.     Left Turbinates: Not enlarged.     Mouth/Throat:     Lips: Pink.     Mouth: Mucous membranes are moist.     Dentition: No dental tenderness, gingival swelling, dental abscesses or gum lesions.     Pharynx: No oropharyngeal exudate.  Eyes:     General: No scleral icterus.       Right eye: No discharge.     Extraocular Movements: Extraocular movements intact.     Conjunctiva/sclera: Conjunctivae normal.     Pupils: Pupils are equal, round, and reactive to light.  Neck:     Thyroid: No thyromegaly.     Trachea: No tracheal deviation.  Cardiovascular:     Rate and Rhythm: Normal rate and regular rhythm.     Heart sounds: Normal heart sounds. No murmur heard.    No friction rub.  Pulmonary:     Effort: Pulmonary effort is normal. No accessory muscle usage or respiratory distress.     Breath sounds: Normal breath sounds. No decreased breath sounds, wheezing, rhonchi or rales.  Abdominal:     General: Bowel sounds are normal. There is no distension.     Palpations: Abdomen is soft. There is no mass.     Tenderness: There is no abdominal tenderness. There is no right CVA tenderness, left CVA tenderness, guarding or rebound.     Hernia: No hernia is present.  Musculoskeletal:        General: No tenderness or deformity. Normal range of motion.     Cervical back: Normal range of motion and neck supple.  Lymphadenopathy:     Cervical: No cervical adenopathy.  Skin:    General: Skin is warm and dry.     Findings: No erythema.  Neurological:     Mental Status: She is alert and oriented to person, place, and time.     Cranial Nerves: No cranial nerve deficit.     Motor: Motor function is intact.     Coordination: Coordination is intact. Coordination normal.     Gait: Gait abnormal (uses cane to assist with ambulation).     Deep  Tendon Reflexes:     Reflex Scores:  Patellar reflexes are 1+ on the right side and 1+ on the left side. Psychiatric:        Attention and Perception: Attention normal.        Mood and Affect: Mood normal.        Speech: Speech normal.        Behavior: Behavior normal.        Thought Content: Thought content normal.        Judgment: Judgment normal.          Patient has been counseled extensively about nutrition and exercise as well as the importance of adherence with medications and regular follow-up. The patient was given clear instructions to go to ER or return to medical center if symptoms don't improve, worsen or new problems develop. The patient verbalized understanding.   Follow-up: Return in about 3 months (around 06/10/2023).   Claiborne Rigg, FNP-BC [facility] [place], [place] [phone]   03/10/2023, 8:19 PM

## 2023-03-10 NOTE — Telephone Encounter (Signed)
Requested Prescriptions  Pending Prescriptions Disp Refills   hydrochlorothiazide (MICROZIDE) 12.5 MG capsule [Pharmacy Med Name: HYDROCHLOROTHIAZIDE 12.5 MG CAP] 90 capsule 1    Sig: TAKE 1 CAPSULE BY MOUTH DAILY     Cardiovascular: Diuretics - Thiazide Failed - 03/10/2023  8:19 AM      Failed - Cr in normal range and within 180 days    Creat  Date Value Ref Range Status  07/24/2016 1.01 0.50 - 1.10 mg/dL Final   Creatinine, Ser  Date Value Ref Range Status  12/12/2022 1.05 (H) 0.57 - 1.00 mg/dL Final         Failed - K in normal range and within 180 days    Potassium  Date Value Ref Range Status  12/12/2022 3.3 (L) 3.5 - 5.2 mmol/L Final         Passed - Na in normal range and within 180 days    Sodium  Date Value Ref Range Status  12/12/2022 143 134 - 144 mmol/L Final         Passed - Last BP in normal range    BP Readings from Last 1 Encounters:  03/10/23 120/69         Passed - Valid encounter within last 6 months    Recent Outpatient Visits           Today Encounter for annual physical exam   [facility] [place], Shea Stakes, NP   2 months ago Primary hypertension   [facility] [place], Shea Stakes, NP   8 months ago Primary hypertension   [facility] [place], Shea Stakes, NP   11 months ago Essential hypertension   [facility] [place], Shea Stakes, NP   1 year ago Chronic hand pain, right   [facility] [place], Shea Stakes, NP       Future Appointments             In 3 months Claiborne Rigg, NP [facility]

## 2023-03-23 ENCOUNTER — Ambulatory Visit (HOSPITAL_BASED_OUTPATIENT_CLINIC_OR_DEPARTMENT_OTHER)
Admission: RE | Admit: 2023-03-23 | Discharge: 2023-03-23 | Disposition: A | Discharge status: Home / Self Care | Payer: 59 | Source: Ambulatory Visit | Attending: Cardiology | Admitting: Cardiology

## 2023-03-23 DIAGNOSIS — I351 Nonrheumatic aortic (valve) insufficiency: Secondary | ICD-10-CM

## 2023-03-25 LAB — ECHOCARDIOGRAM COMPLETE
AR max vel: 2.01 cm2
AV Area VTI: 2.31 cm2
AV Area mean vel: 2.04 cm2
AV Mean grad: 8 mm[Hg]
AV Peak grad: 14.7 mm[Hg]
AV Vena cont: 0.3 cm
Ao pk vel: 1.92 m/s
Area-P 1/2: 3.08 cm2
Calc EF: 68.5 %
P 1/2 time: 961 ms
S' Lateral: 2.5 cm
Single Plane A2C EF: 69.6 %
Single Plane A4C EF: 64.4 %

## 2023-03-26 ENCOUNTER — Telehealth: Payer: Self-pay

## 2023-03-26 NOTE — Telephone Encounter (Signed)
-----   Message from Aundra Dubin Revankar sent at 03/26/2023  7:46 AM EDT ----- The results of the study is unremarkable. Please inform patient. I will discuss in detail at next appointment. Cc  primary care/referring physician Garwin Brothers, MD 03/26/2023 7:46 AM

## 2023-03-26 NOTE — Telephone Encounter (Signed)
Left vm to return call  Grade I diastolic dysfunction (impaired relaxation) this can come from normal aging and/or high blood pressure. Heart healthy diet, maintaining a normal BP and exercising can help this from getting worse.  Aortic valve regurgitation  is mild   Unchanged from previous echo

## 2023-03-31 ENCOUNTER — Telehealth: Payer: Self-pay | Admitting: Cardiology

## 2023-03-31 NOTE — Telephone Encounter (Signed)
Results reviewed with pt as per Dr. Revankar's note.  Pt verbalized understanding and had no additional questions. Routed to PCP.  

## 2023-03-31 NOTE — Telephone Encounter (Signed)
Pt returning nurses phone call. Please advise ?

## 2023-04-08 ENCOUNTER — Telehealth: Payer: Self-pay

## 2023-04-08 NOTE — Telephone Encounter (Unsigned)
Copied from CRM [id number]. Topic: General - Other >> Apr 08, 2023  9:27 AM De Blanch wrote: Reason for CRM: Pt stated her dentures broke in half last night. Pt is requesting the information for who PCP sent her to; a dentist stated this was many years ago and its behind the [facility]. Per referral from 2017 [facility] Address:[address], [place], [place] [address].    Please advise.

## 2023-04-08 NOTE — Telephone Encounter (Signed)
Noted  

## 2023-04-10 ENCOUNTER — Telehealth: Payer: Self-pay

## 2023-04-10 NOTE — Telephone Encounter (Unsigned)
Copied from CRM [id number]. Topic: General - Other >> Apr 10, 2023  1:34 PM Franchot Heidelberg wrote: Reason for CRM:   Angie calling from [facility], needs office visit notes from the last 6 months that include incontinence.   Best contact: [phone]  They faxed this on 02/17/2023, she plans to refax this again today

## 2023-04-13 ENCOUNTER — Other Ambulatory Visit: Payer: Self-pay | Admitting: Neurology

## 2023-04-13 NOTE — Telephone Encounter (Signed)
Call to set an appointment for this unanswered. Voicemail left to return call.

## 2023-04-22 ENCOUNTER — Encounter: Payer: Self-pay | Admitting: Nurse Practitioner

## 2023-04-22 ENCOUNTER — Ambulatory Visit: Payer: 59 | Attending: Nurse Practitioner | Admitting: Nurse Practitioner

## 2023-04-22 VITALS — BP 112/64 | HR 60 | Ht 66.0 in | Wt 183.8 lb

## 2023-04-22 DIAGNOSIS — L739 Follicular disorder, unspecified: Secondary | ICD-10-CM

## 2023-04-22 DIAGNOSIS — N393 Stress incontinence (female) (male): Secondary | ICD-10-CM | POA: Diagnosis not present

## 2023-04-22 MED ORDER — MUPIROCIN 2 % EX OINT
1.0000 | TOPICAL_OINTMENT | Freq: Two times a day (BID) | CUTANEOUS | 3 refills | Status: DC
Start: 2023-04-22 — End: 2023-07-08

## 2023-04-22 NOTE — Progress Notes (Signed)
Assessment & Plan:  [name] was seen today for  incontinence supplies.  Diagnoses and all orders for this visit:  Stress incontinence Faxed clinical notes to DME company today   Folliculitis -     mupirocin ointment (BACTROBAN) 2 %; Apply 1 Application topically 2 (two) times daily. Apply to back of head    Patient has been counseled on age-appropriate routine health concerns for screening and prevention. These are reviewed and up-to-date. Referrals have been placed accordingly. Immunizations are up-to-date or declined.    Subjective:   Chief Complaint  Patient presents with    Incontinence Supplies    Brandy Roberts 52 y.o. female presents to office today requesting incontinence.   She has a past medical history of Anemia, Anxiety, Aortic regurgitation, Cardiomyopathy due to hypertension (10/23/2014), Chronic ischemic vertebrobasilar artery thalamic stroke, Depression, Essential hypertension (05/02/2015), Former smoker (01/24/2015), H/O noncompliance with medical treatment, presenting hazards to health, Headache (05/01/2015), History of ischemic middle cerebral artery stroke embolic, Hyperlipidemia, Hypertension, Hypertensive heart disease, Migraine (05/21/2018), Migraines, Nonrheumatic aortic (valve) insufficiency, Osteoarthritis, Overweight (BMI 25.0-29.9), Palpitations (10/12/2018), Sleep apnea, Snoring (05/02/2015), Stroke (10/25/2016), and Syncope and collapse (10/07/2018).       Incontinence Onset 2-3 years ago. She uses adult diapers "24/7".  She leaks urine with bending, coughing, lifting, standing, walking, laughing, sneezing, with urge, with a full bladder, during the night. Patient describes the symptoms as  a blunted sense of urge to urinate, urge to urinate with little or no warning, urine leakage with coughing/heavy physical activity, and urine leaking unpredictably.  Factors associated with symptoms include associated with menopause, decreased sensation in perineum,  medications. Evaluation to date includes UA/CS: normal.Treatment to date includes none.     She has several areas of papular inflamed lesions on the nape area. States they started off as hair bumps and have been irritated. She does shave this area of her head.     Review of Systems  Constitutional:  Negative for fever, malaise/fatigue and weight loss.  HENT: Negative.  Negative for nosebleeds.   Eyes: Negative.  Negative for blurred vision, double vision and photophobia.  Respiratory: Negative.  Negative for cough and shortness of breath.   Cardiovascular: Negative.  Negative for chest pain, palpitations and leg swelling.  Gastrointestinal: Negative.  Negative for heartburn, nausea and vomiting.  Genitourinary:  Positive for frequency and urgency. Negative for dysuria, flank pain and hematuria.  Musculoskeletal: Negative.  Negative for myalgias.  Neurological: Negative.  Negative for dizziness, focal weakness, seizures and headaches.  Psychiatric/Behavioral: Negative.  Negative for suicidal ideas.     Past Medical History:  Diagnosis Date   Anemia    Anxiety    Aortic regurgitation    Cardiomyopathy due to hypertension [name] 10/23/2014   Chest discomfort 10/12/2018   Chronic ischemic vertebrobasilar artery thalamic stroke    Depression    Essential hypertension 05/02/2015   Facet arthropathy 05/15/2021   Former smoker 01/24/2015   H/O noncompliance with medical treatment, presenting hazards to health    Headache 05/01/2015   Hemiparesis affecting left side as late effect of cerebrovascular accident [name] 07/11/2022   History of ischemic middle cerebral artery stroke embolic    Hyperlipidemia    Hypertension    Hypertensive heart disease    Lumbar radiculopathy 05/01/2021   Migraine 05/21/2018   Migraines    Mixed dyslipidemia 11/26/2020   Nonrheumatic aortic (valve) insufficiency    Obesity (BMI 35.0-39.9 without comorbidity) 11/26/2020   Osteoarthritis    Overweight  (BMI  25.0-29.9)    Palpitations 10/12/2018   SI joint arthritis [name] 05/15/2021   Sleep apnea    Snoring 05/02/2015   Stroke [name] 10/25/2016   Syncope and collapse 10/07/2018   Vitamin D deficiency 05/15/2021    Past Surgical History:  Procedure Laterality Date   CESAREAN SECTION     GANGLION CYST EXCISION     TEE WITHOUT CARDIOVERSION N/A 10/09/2014   Procedure: TRANSESOPHAGEAL ECHOCARDIOGRAM (TEE);  Surgeon: Thurmon Fair, MD;  Location: [facility] ENDOSCOPY;  Service: Cardiovascular;  Laterality: N/A;   TONSILLECTOMY      Family History  Problem Relation Age of Onset   Cancer Mother        type unknown   Hypertension Mother    Hypertension Sister    Diabetes Sister    Cancer Maternal Aunt        4 aunts died of cancer types unknown    Social History Reviewed with no changes to be made today.   Outpatient Medications Prior to Visit  Medication Sig Dispense Refill   amLODipine (NORVASC) 10 MG tablet Take 1 tablet (10 mg total) by mouth daily. TAKE 1 TABLET BY MOUTH EVERY DAY FOR BLOOD PRESSURE 90 tablet 1   Atogepant (QULIPTA) 60 MG TABS Take 1 tablet (60 mg total) by mouth daily. 30 tablet 11   atorvastatin (LIPITOR) 80 MG tablet TAKE 1 TABLET BY MOUTH EVERY DAY AT 6PM FOR CHOLESTEROL 90 tablet 1   busPIRone (BUSPAR) 10 MG tablet Take 1 tablet (10 mg total) by mouth 3 (three) times daily. 90 tablet 3   chlorhexidine (HIBICLENS) 4 % external liquid Apply topically daily as needed. 1000 mL 1   clopidogrel (PLAVIX) 75 MG tablet Take 1 tablet (75 mg total) by mouth daily. 90 tablet 3   Elastic Bandages & Supports (POST-OP SHOE/SOFT TOP WOMEN) MISC 1 each by Does not apply route daily as needed. 1 each 0   flurbiprofen (ANSAID) 100 MG tablet TAKE 1 TABLET BY MOUTH EVERY 8 HOURS AS NEEDED (NOT MORE THAN 3 TABLETS A DAY) 30 tablet 5   hydrALAZINE (APRESOLINE) 50 MG tablet TAKE 1 TABLET BY MOUTH 3 TIMES DAILY 270 tablet 1   hydrochlorothiazide (MICROZIDE) 12.5 MG capsule TAKE 1 CAPSULE  BY MOUTH DAILY 90 capsule 1   hydrOXYzine (ATARAX/VISTARIL) 50 MG tablet Take 50 mg by mouth 3 (three) times daily as needed for anxiety or sleep.     lisinopril (ZESTRIL) 10 MG tablet Take 1 tablet (10 mg total) by mouth daily. 90 tablet 1   metoCLOPramide (REGLAN) 10 MG tablet Take 10 mg by mouth every 6 (six) hours as needed for nausea or headache.     metoprolol tartrate (LOPRESSOR) 25 MG tablet Take 1 tablet (25 mg total) by mouth 2 (two) times daily. 180 tablet 1   Misc. Devices MISC Incontinent supplies 1 each 0   nitroGLYCERIN (NITROSTAT) 0.4 MG SL tablet Place 1 tablet (0.4 mg total) under the tongue every 5 (five) minutes as needed for chest pain. 30 tablet 0   pantoprazole (PROTONIX) 20 MG tablet TAKE 1 TABLET BY MOUTH EVERY DAY 90 tablet 1   spironolactone (ALDACTONE) 25 MG tablet TAKE 1 TABLET BY MOUTH EVERY DAY 90 tablet 1   traZODone (DESYREL) 100 MG tablet Take 1 tablet (100 mg total) by mouth at bedtime. 90 tablet 1   venlafaxine XR (EFFEXOR-XR) 75 MG 24 hr capsule TAKE 3 CAPSULES BY MOUTH EVERY MORNING WITH BREAKFAST 90 capsule 3   Vitamin D, Ergocalciferol, (  DRISDOL) 1.25 MG (50000 UNIT) CAPS capsule Take 1 capsule (50,000 Units total) by mouth every 7 (seven) days. Please fill as a 90 day supply 12 capsule 0   mupirocin ointment (BACTROBAN) 2 % Apply 1 Application topically 2 (two) times daily. 200 g 3   HYDROcodone-acetaminophen (NORCO/VICODIN) 5-325 MG tablet Take 1 tablet by mouth every 8 (eight) hours as needed. (Patient not taking: Reported on 04/22/2023) 15 tablet 0   No facility-administered medications prior to visit.    Allergies  Allergen Reactions   Ibuprofen Other (See Comments)    Can not take with current medications .   Tylenol [Acetaminophen] Other (See Comments)    Pt stated tylenol gives her extreme headache       Objective:    BP 112/64 (BP Location: Left Arm, Patient Position: Sitting, Cuff Size: Normal)   Pulse 60   Ht 5\' 6"  (1.676 m)   Wt 183  lb 12.8 oz (83.4 kg)   LMP 03/19/2016   SpO2 100%   BMI 29.67 kg/m  Wt Readings from Last 3 Encounters:  04/22/23 183 lb 12.8 oz (83.4 kg)  03/10/23 180 lb (81.6 kg)  02/18/23 176 lb 1.3 oz (79.9 kg)    Physical Exam Vitals and nursing note reviewed.  Constitutional:      Appearance: She is well-developed.  HENT:     Head: Normocephalic and atraumatic.  Cardiovascular:     Rate and Rhythm: Normal rate and regular rhythm.     Heart sounds: Normal heart sounds. No murmur heard.    No friction rub. No gallop.  Pulmonary:     Effort: Pulmonary effort is normal. No tachypnea or respiratory distress.     Breath sounds: Normal breath sounds. No decreased breath sounds, wheezing, rhonchi or rales.  Chest:     Chest wall: No tenderness.  Abdominal:     General: Bowel sounds are normal.     Palpations: Abdomen is soft.  Musculoskeletal:        General: Normal range of motion.     Cervical back: Normal range of motion.  Skin:    General: Skin is warm and dry.  Neurological:     Mental Status: She is alert and oriented to person, place, and time.     Coordination: Coordination normal.  Psychiatric:        Behavior: Behavior normal. Behavior is cooperative.        Thought Content: Thought content normal.        Judgment: Judgment normal.          Patient has been counseled extensively about nutrition and exercise as well as the importance of adherence with medications and regular follow-up. The patient was given clear instructions to go to ER or return to medical center if symptoms don't improve, worsen or new problems develop. The patient verbalized understanding.   Follow-up: No follow-ups on file.   Claiborne Rigg, FNP-BC [facility] [place], [place] [phone]   04/22/2023, 9:15 AM

## 2023-05-11 IMAGING — MR MR LUMBAR SPINE W/O CM
4 of 5 series · 25 of 48 positions shown · non-contrast
Comparison: MRI 12/12/2006.  Radiography 05/15/2021.

CLINICAL DATA: Back pain over the last 6 months. Bilateral leg
pain.

EXAM:
MRI LUMBAR SPINE WITHOUT CONTRAST
TECHNIQUE: Multiplanar, multisequence MR imaging of the lumbar spine was
performed. No intravenous contrast was administered.

[Series 3: T2 · sagittal · 4.0mm · 0.51mm/px · 6 of 13 slices shown (1 of 2)]
[im 1/13]
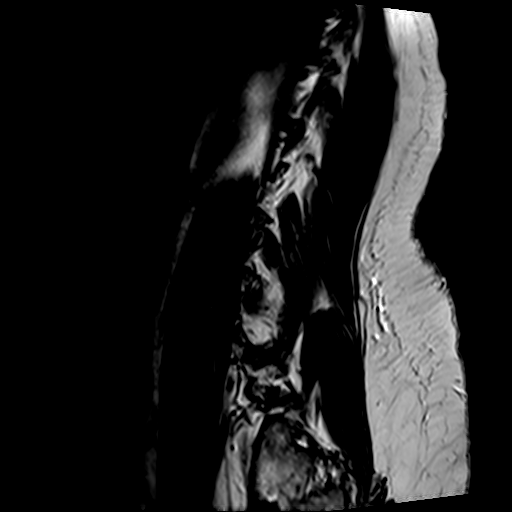
[im 3/13]
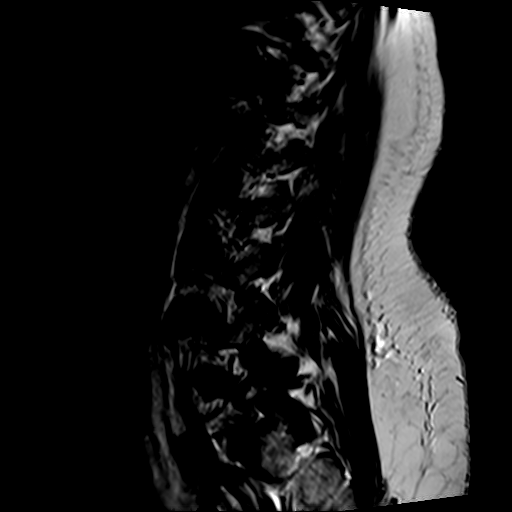
[im 5/13]
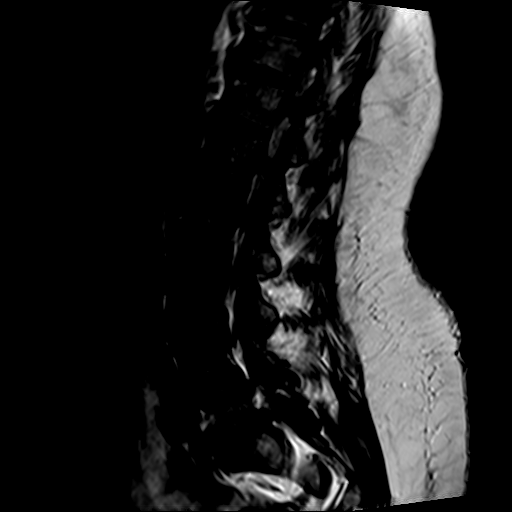
[im 8/13]
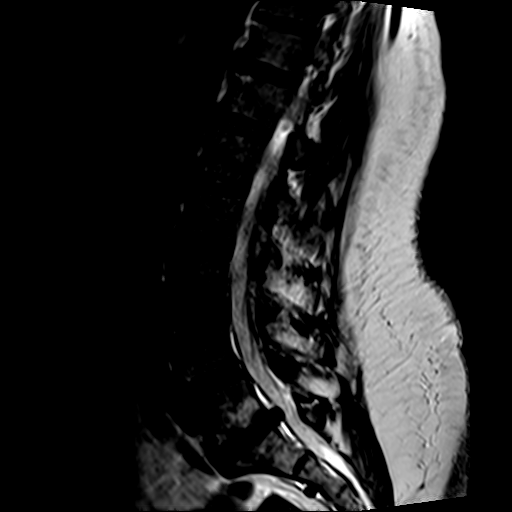
[im 10/13]
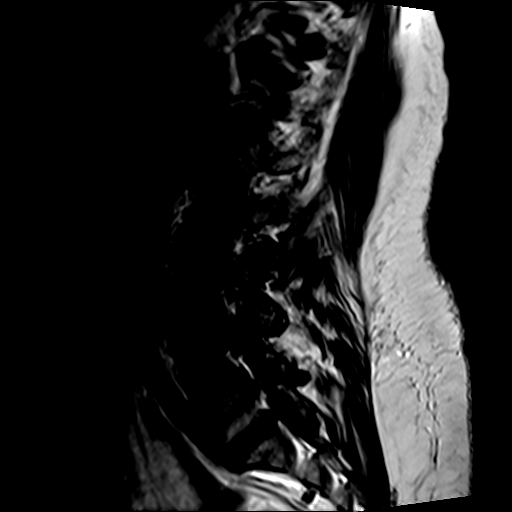
[im 13/13]
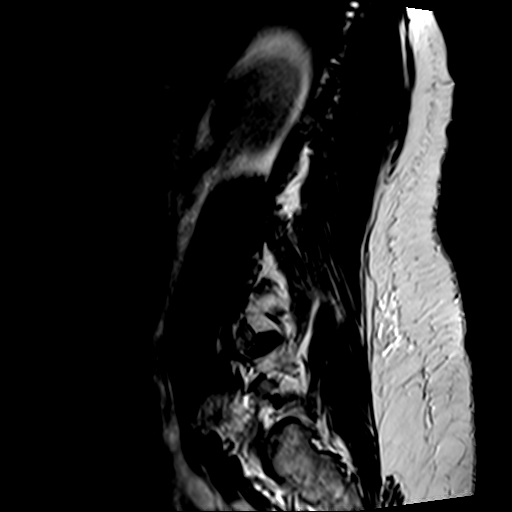

[Series 5: T1 · sagittal · 4.0mm · 0.51mm/px · 6 of 13 slices shown (1 of 2)]
[im 1/13]
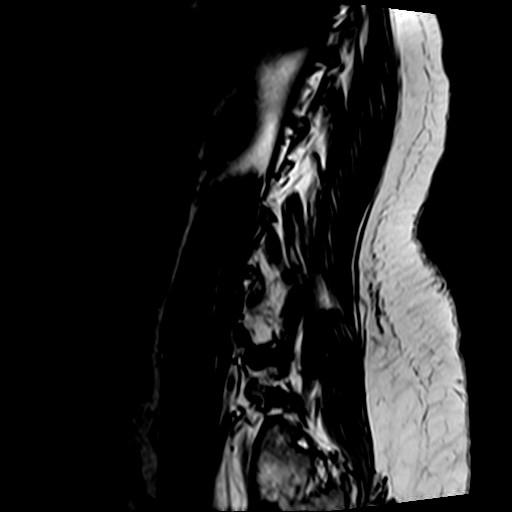
[im 3/13]
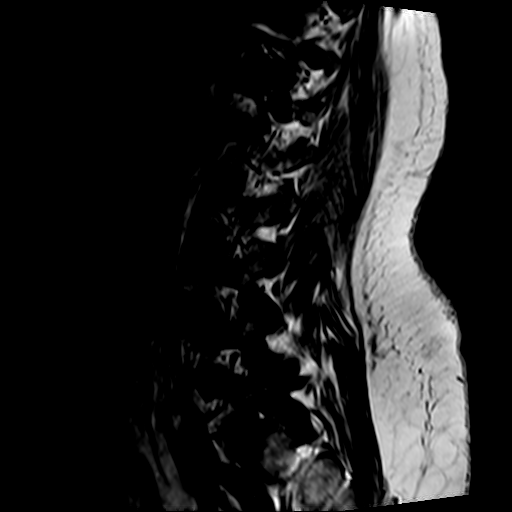
[im 5/13]
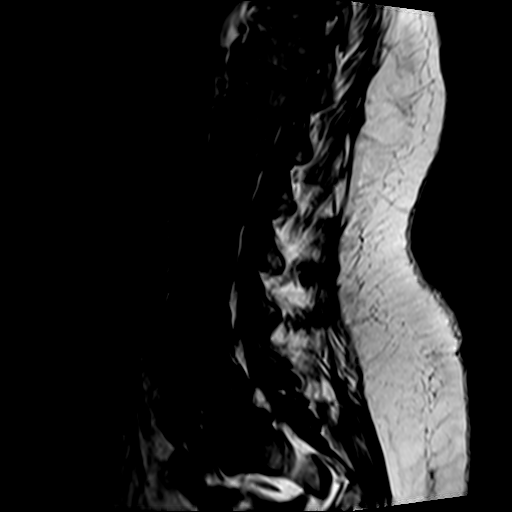
[im 8/13]
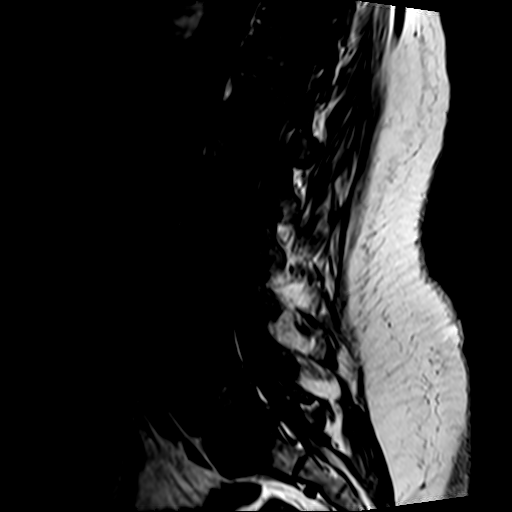
[im 10/13]
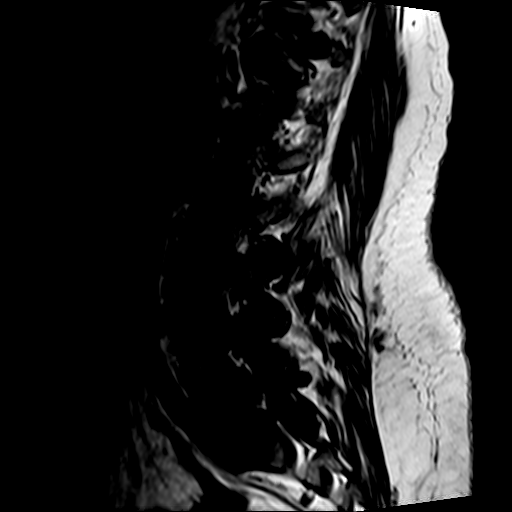
[im 13/13]
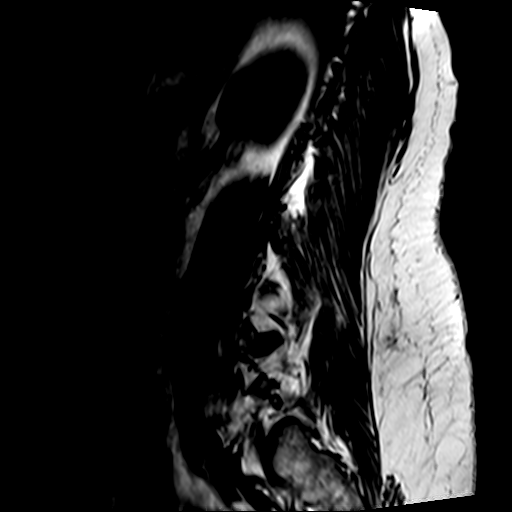

[Series 6: T2 · axial · 4.0mm · 0.78mm/px · z∈[-130,+54]mm · 9 of 33 slices shown (2 of 2)]
[im 1/33]
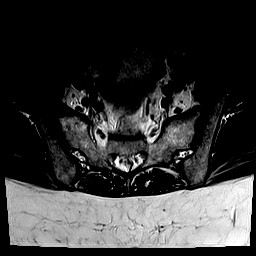
[im 5/33]
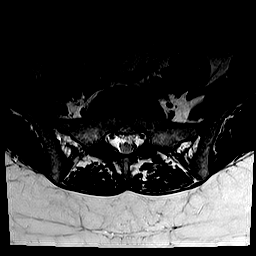
[im 10/33]
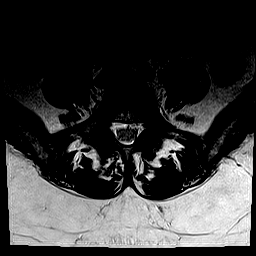
[im 14/33]
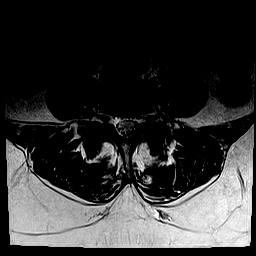
[im 17/33]
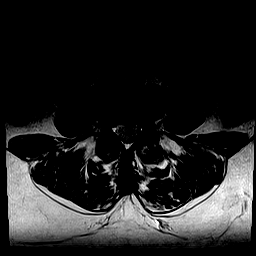
[im 19/33]
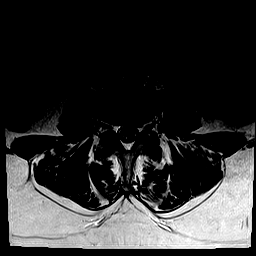
[im 23/33]
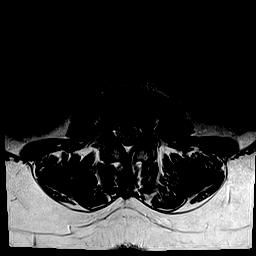
[im 28/33]
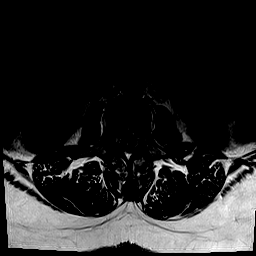
[im 33/33]
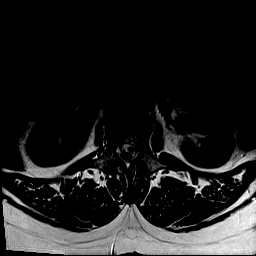

[Series 8: T1 · axial · 4.0mm · 0.39mm/px · z∈[-130,+29]mm · 4 of 33 slices shown (2 of 2)]
[im 1/33]
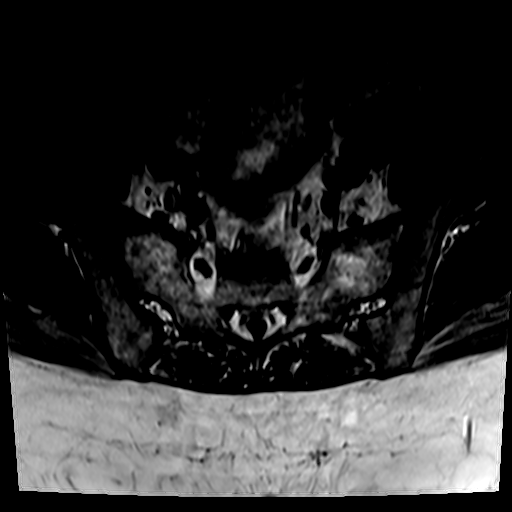
[im 5/33]
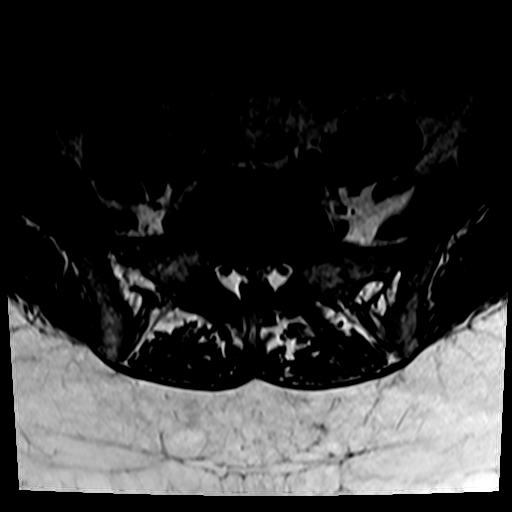
[im 17/33]
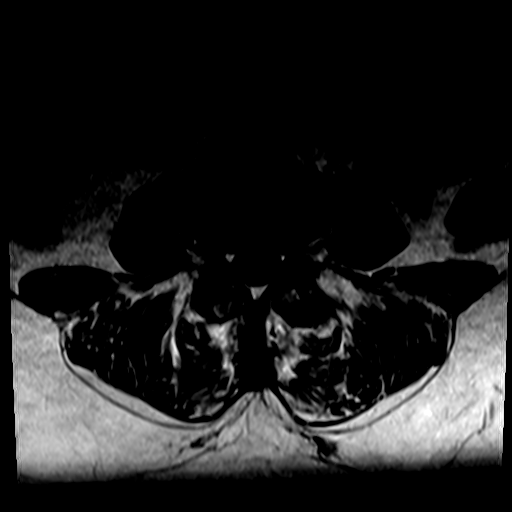
[im 28/33]
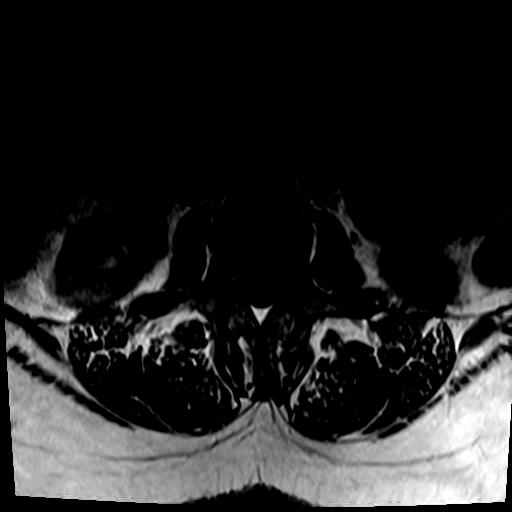

[25 of 48 positions shown; findings below may reference images not displayed]

FINDINGS: Segmentation: 5 lumbar type vertebral bodies as numbered previously.

Alignment:  2 mm degenerative anterolisthesis L4-5.

Vertebrae: Discogenic endplate marrow changes at L5-S1 including
some edema. This could relate to low back pain.

Conus medullaris and cauda equina: Conus extends to the L2 level.
Conus and cauda equina appear normal.

Paraspinal and other soft tissues: Negative

Disc levels:

No significant finding at L2-3 or above.

L3-4: Minimal desiccation of the disc without bulge or herniation.
Mild facet osteoarthritis. No stenosis.

L4-5: Advanced bilateral facet osteoarthritis with 2 mm of
anterolisthesis. Desiccation of the disc without bulge or
herniation. No stenosis of the canal or foramina. The facet
arthropathy could be painful.

L5-S1: Progressive disc degeneration at this level. Endplate
osteophytes with broad-based disc herniation which indents the
ventral subarachnoid space slightly and is adjacent to both S1 root
sleeves. Discogenic endplate marrow changes including some edema,
which could be associated with regional pain. Bilateral facet
degeneration and hypertrophy. Bilateral foraminal narrowing, right
more than left, which could affect the exiting L5 nerves.
IMPRESSION: L4-5: Worsening of bilateral facet arthropathy. Degenerative
anterolisthesis of 2 mm. No apparent compressive stenosis. Facet
arthritis could be a cause of back pain or referred facet syndrome
pain.

L5-S1: Worsening of degenerative disc disease since 8772. Endplate
osteophytes and broad-based disc herniation, with slight indentation
of the thecal sac and contact with both exiting S1 root sleeves.
There is bilateral facet degeneration and hypertrophy also at this
level. There is foraminal narrowing right more than left that could
affect the exiting L5 nerves, more likely the right.

## 2023-05-13 ENCOUNTER — Other Ambulatory Visit: Payer: Self-pay | Admitting: Nurse Practitioner

## 2023-05-13 ENCOUNTER — Other Ambulatory Visit: Payer: Self-pay | Admitting: Family Medicine

## 2023-05-13 DIAGNOSIS — I10 Essential (primary) hypertension: Secondary | ICD-10-CM

## 2023-06-10 ENCOUNTER — Other Ambulatory Visit: Payer: Self-pay | Admitting: Nurse Practitioner

## 2023-06-10 DIAGNOSIS — I10 Essential (primary) hypertension: Secondary | ICD-10-CM

## 2023-06-15 ENCOUNTER — Other Ambulatory Visit: Payer: Self-pay | Admitting: Nurse Practitioner

## 2023-06-15 ENCOUNTER — Other Ambulatory Visit: Payer: Self-pay | Admitting: Physician Assistant

## 2023-06-15 DIAGNOSIS — Z8673 Personal history of transient ischemic attack (TIA), and cerebral infarction without residual deficits: Secondary | ICD-10-CM

## 2023-06-17 ENCOUNTER — Ambulatory Visit: Payer: 59 | Admitting: Nurse Practitioner

## 2023-07-02 ENCOUNTER — Ambulatory Visit (INDEPENDENT_AMBULATORY_CARE_PROVIDER_SITE_OTHER): Payer: 59 | Admitting: Primary Care

## 2023-07-02 ENCOUNTER — Encounter (INDEPENDENT_AMBULATORY_CARE_PROVIDER_SITE_OTHER): Payer: Self-pay | Admitting: Primary Care

## 2023-07-02 VITALS — BP 118/66 | HR 61 | Resp 16 | Ht 66.0 in | Wt 191.2 lb

## 2023-07-02 DIAGNOSIS — I10 Essential (primary) hypertension: Secondary | ICD-10-CM | POA: Diagnosis not present

## 2023-07-02 DIAGNOSIS — R7303 Prediabetes: Secondary | ICD-10-CM

## 2023-07-02 DIAGNOSIS — Z2821 Immunization not carried out because of patient refusal: Secondary | ICD-10-CM

## 2023-07-02 DIAGNOSIS — E782 Mixed hyperlipidemia: Secondary | ICD-10-CM

## 2023-07-02 NOTE — Progress Notes (Signed)
[facility]  Lexys Milliner, is a 53 y.o. female  WNU:[id number]  IHK:[id number]  DOB - Oct 03, 1970  Chief Complaint  Patient presents with   Hypertension   Hyperlipidemia   Prediabetes       Subjective:  Quad cane  Ms.Malanie Koloski is a 53 y.o. obese female using a quad cane for unstable gait and left hand weakness here today for a follow up visit HTN. Patient has No headache, No chest pain, No abdominal pain - No Nausea, No new weakness tingling or numbness, No Cough - shortness of breath Prediabetes- Denies polyuria, polydipsia, polyphasia or vision changes.  Does not check blood sugars at home.  Needing labs drawn and upset she was at [facility] asked did she want to schedule with her PCP - no I am already here now do what you have to do! No problems updated.  Comprehensive ROS Pertinent positive and negative noted in HPI   Allergies  Allergen Reactions   Ibuprofen Other (See Comments)    Can not take with current medications .   Tylenol [Acetaminophen] Other (See Comments)    Pt stated tylenol gives her extreme headache    Past Medical History:  Diagnosis Date   Anemia    Anxiety    Aortic regurgitation    Cardiomyopathy due to hypertension [name] 10/23/2014   Chest discomfort 10/12/2018   Chronic ischemic vertebrobasilar artery thalamic stroke    Depression    Essential hypertension 05/02/2015   Facet arthropathy 05/15/2021   Former smoker 01/24/2015   H/O noncompliance with medical treatment, presenting hazards to health    Headache 05/01/2015   Hemiparesis affecting left side as late effect of cerebrovascular accident [name] 07/11/2022   History of ischemic middle cerebral artery stroke embolic    Hyperlipidemia    Hypertension    Hypertensive heart disease    Lumbar radiculopathy 05/01/2021   Migraine 05/21/2018   Migraines    Mixed dyslipidemia 11/26/2020   Nonrheumatic aortic (valve) insufficiency    Obesity (BMI 35.0-39.9 without  comorbidity) 11/26/2020   Osteoarthritis    Overweight (BMI 25.0-29.9)    Palpitations 10/12/2018   SI joint arthritis [name] 05/15/2021   Sleep apnea    Snoring 05/02/2015   Stroke [name] 10/25/2016   Syncope and collapse 10/07/2018   Vitamin D deficiency 05/15/2021    Current Outpatient Medications on File Prior to Visit  Medication Sig Dispense Refill   amLODipine (NORVASC) 10 MG tablet TAKE 1 TABLET BY MOUTH EVERY DAY FOR BLOOD PRESSURE 90 tablet 0   Atogepant (QULIPTA) 60 MG TABS Take 1 tablet (60 mg total) by mouth daily. 30 tablet 11   atorvastatin (LIPITOR) 80 MG tablet TAKE 1 TABLET BY MOUTH EVERY DAY AT 6PM FOR CHOLESTEROL 90 tablet 1   busPIRone (BUSPAR) 10 MG tablet Take 1 tablet (10 mg total) by mouth 3 (three) times daily. 90 tablet 3   chlorhexidine (HIBICLENS) 4 % external liquid Apply topically daily as needed. 1000 mL 1   clopidogrel (PLAVIX) 75 MG tablet TAKE 1 TABLET BY MOUTH EVERY DAY 90 tablet 0   Elastic Bandages & Supports (POST-OP SHOE/SOFT TOP WOMEN) MISC 1 each by Does not apply route daily as needed. 1 each 0   flurbiprofen (ANSAID) 100 MG tablet TAKE 1 TABLET BY MOUTH EVERY 8 HOURS AS NEEDED (NOT MORE THAN 3 TABLETS A DAY) 30 tablet 5   hydrALAZINE (APRESOLINE) 50 MG tablet TAKE 1 TABLET BY MOUTH 3 TIMES DAILY 270 tablet 1   hydrochlorothiazide (  MICROZIDE) 12.5 MG capsule TAKE 1 CAPSULE BY MOUTH DAILY 90 capsule 1   hydrOXYzine (ATARAX/VISTARIL) 50 MG tablet Take 50 mg by mouth 3 (three) times daily as needed for anxiety or sleep.     lisinopril (ZESTRIL) 10 MG tablet Take 1 tablet (10 mg total) by mouth daily. 90 tablet 1   metoCLOPramide (REGLAN) 10 MG tablet Take 10 mg by mouth every 6 (six) hours as needed for nausea or headache.     metoprolol tartrate (LOPRESSOR) 25 MG tablet Take 1 tablet (25 mg total) by mouth 2 (two) times daily. 180 tablet 1   Misc. Devices MISC Incontinent supplies 1 each 0   mupirocin ointment (BACTROBAN) 2 % Apply 1 Application  topically 2 (two) times daily. Apply to back of head 200 g 3   nitroGLYCERIN (NITROSTAT) 0.4 MG SL tablet Place 1 tablet (0.4 mg total) under the tongue every 5 (five) minutes as needed for chest pain. 30 tablet 0   pantoprazole (PROTONIX) 20 MG tablet TAKE 1 TABLET BY MOUTH EVERY DAY 90 tablet 1   spironolactone (ALDACTONE) 25 MG tablet TAKE 1 TABLET BY MOUTH EVERY DAY 90 tablet 0   traZODone (DESYREL) 100 MG tablet TAKE 1 TABLET BY MOUTH EVERY NIGHT AT BEDTIME 90 tablet 0   venlafaxine XR (EFFEXOR-XR) 75 MG 24 hr capsule TAKE 3 CAPSULES BY MOUTH EVERY MORNING WITH BREAKFAST 90 capsule 3   No current facility-administered medications on file prior to visit.   Health Maintenance  Topic Date Due   COVID-19 Vaccine (1) Never done   Zoster (Shingles) Vaccine (1 of 2) 09/30/2023*   Pneumococcal Vaccination (1 of 2 - PCV) 07/01/2024*   Mammogram  09/09/2023   Yearly kidney function blood test for diabetes  12/12/2023   Yearly kidney health urinalysis for diabetes  12/12/2023   Medicare Annual Wellness Visit  03/09/2024   Pap with HPV screening  05/16/2024   Hepatitis C Screening  Completed   HIV Screening  Completed   HPV Vaccine  Aged Out   DTaP/Tdap/Td vaccine  Discontinued   Flu Shot  Discontinued   Colon Cancer Screening  Discontinued  *Topic was postponed. The date shown is not the original due date.    Objective:   Vitals:   07/02/23 1031  BP: 118/66  Pulse: 61  Resp: 16  SpO2: 100%  Weight: 191 lb 3.2 oz (86.7 kg)  Height: 5\' 6"  (1.676 m)   BP Readings from Last 3 Encounters:  07/02/23 118/66  04/22/23 112/64  03/10/23 120/69      Physical Exam Vitals reviewed.  Constitutional:      Appearance: She is obese.  HENT:     Head: Normocephalic.     Right Ear: Tympanic membrane and external ear normal.     Left Ear: Tympanic membrane and external ear normal.  Eyes:     Extraocular Movements: Extraocular movements intact.  Cardiovascular:     Rate and Rhythm: Normal  rate and regular rhythm.  Pulmonary:     Effort: Pulmonary effort is normal.     Breath sounds: Normal breath sounds.  Abdominal:     General: Bowel sounds are normal. There is distension.  Musculoskeletal:     Cervical back: Normal range of motion and neck supple.     Comments: unstable gait and left hand weakness   Skin:    General: Skin is warm and dry.  Neurological:     Mental Status: She is alert and oriented to person, place,  and time.  Psychiatric:        Mood and Affect: Mood normal.        Behavior: Behavior normal.      Assessment & Plan  Jenasia was seen today for hypertension, hyperlipidemia and prediabetes.  Diagnoses and all orders for this visit:  Essential hypertension Well controlled  BP goal - < 130/80 Explained that having normal blood pressure is the goal and medications are helping to get to goal and maintain normal blood pressure. DIET: Limit salt intake, read nutrition labels to check salt content, limit fried and high fatty foods  Avoid using multisymptom OTC cold preparations that generally contain sudafed which can rise BP. Consult with pharmacist on best cold relief products to use for persons with HTN EXERCISE Discussed incorporating exercise such as walking - 30 minutes most days of the week and can do in 10 minute intervals    -     CBC with Differential/Platelet -     CMP14+EGFR  Mixed hyperlipidemia On statin  -     Lipid panel  Prediabetes -     CBC with Differential/Platelet -     CMP14+EGFR -     Hemoglobin A1c  Herpes zoster vaccination declined  Pneumococcal vaccination declined   Patient have been counseled extensively about nutrition and exercise. Other issues discussed during this visit include: low cholesterol diet, weight control and daily exercise, foot care, annual eye examinations at Ophthalmology, importance of adherence with medications and regular follow-up. We also discussed long term complications of uncontrolled  diabetes and hypertension.   Return in about 3 months (around 09/30/2023) for PCP.  The patient was given clear instructions to go to ER or return to medical center if symptoms don't improve, worsen or new problems develop. The patient verbalized understanding. The patient was told to call to get lab results if they haven't heard anything in the next week.   This note has been created with Education officer, environmental. Any transcriptional errors are unintentional.   Grayce Sessions, NP 07/02/2023, 12:23 PM

## 2023-07-03 LAB — CMP14+EGFR
ALT: 24 [IU]/L (ref 0–32)
AST: 25 [IU]/L (ref 0–40)
Albumin: 4.2 g/dL (ref 3.8–4.9)
Alkaline Phosphatase: 136 [IU]/L — ABNORMAL HIGH (ref 44–121)
BUN/Creatinine Ratio: 16 (ref 9–23)
BUN: 17 mg/dL (ref 6–24)
Bilirubin Total: 0.3 mg/dL (ref 0.0–1.2)
CO2: 24 mmol/L (ref 20–29)
Calcium: 9.5 mg/dL (ref 8.7–10.2)
Chloride: 103 mmol/L (ref 96–106)
Creatinine, Ser: 1.09 mg/dL — ABNORMAL HIGH (ref 0.57–1.00)
Globulin, Total: 3.2 g/dL (ref 1.5–4.5)
Glucose: 135 mg/dL — ABNORMAL HIGH (ref 70–99)
Potassium: 4.3 mmol/L (ref 3.5–5.2)
Sodium: 144 mmol/L (ref 134–144)
Total Protein: 7.4 g/dL (ref 6.0–8.5)
eGFR: 61 mL/min/{1.73_m2} (ref >59)

## 2023-07-03 LAB — CBC WITH DIFFERENTIAL/PLATELET
Basophils Absolute: 0.1 10*3/uL (ref 0.0–0.2)
Basos: 1 %
EOS (ABSOLUTE): 0.6 10*3/uL — ABNORMAL HIGH (ref 0.0–0.4)
Eos: 12 %
Hematocrit: 43.2 % (ref 34.0–46.6)
Hemoglobin: 13.6 g/dL (ref 11.1–15.9)
Immature Grans (Abs): 0 10*3/uL (ref 0.0–0.1)
Immature Granulocytes: 0 %
Lymphocytes Absolute: 1.6 10*3/uL (ref 0.7–3.1)
Lymphs: 29 %
MCH: 27.9 pg (ref 26.6–33.0)
MCHC: 31.5 g/dL (ref 31.5–35.7)
MCV: 89 fL (ref 79–97)
Monocytes Absolute: 0.7 10*3/uL (ref 0.1–0.9)
Monocytes: 12 %
Neutrophils Absolute: 2.4 10*3/uL (ref 1.4–7.0)
Neutrophils: 46 %
Platelets: 242 10*3/uL (ref 150–450)
RBC: 4.87 x10E6/uL (ref 3.77–5.28)
RDW: 13.6 % (ref 11.7–15.4)
WBC: 5.4 10*3/uL (ref 3.4–10.8)

## 2023-07-03 LAB — LIPID PANEL
Chol/HDL Ratio: 3 {ratio} (ref 0.0–4.4)
Cholesterol, Total: 135 mg/dL (ref 100–199)
HDL: 45 mg/dL (ref >39)
LDL Chol Calc (NIH): 72 mg/dL (ref 0–99)
Triglycerides: 96 mg/dL (ref 0–149)
VLDL Cholesterol Cal: 18 mg/dL (ref 5–40)

## 2023-07-03 LAB — HEMOGLOBIN A1C
Est. average glucose Bld gHb Est-mCnc: 137 mg/dL
Hgb A1c MFr Bld: 6.4 % — ABNORMAL HIGH (ref 4.8–5.6)

## 2023-07-08 ENCOUNTER — Other Ambulatory Visit (INDEPENDENT_AMBULATORY_CARE_PROVIDER_SITE_OTHER): Payer: Self-pay

## 2023-07-08 DIAGNOSIS — L739 Follicular disorder, unspecified: Secondary | ICD-10-CM

## 2023-07-08 MED ORDER — MUPIROCIN 2 % EX OINT: 1.0000 | TOPICAL_OINTMENT | Freq: Two times a day (BID) | CUTANEOUS | 3 refills | Status: AC

## 2023-07-23 ENCOUNTER — Other Ambulatory Visit: Payer: Self-pay | Admitting: Family Medicine

## 2023-07-23 DIAGNOSIS — K2100 Gastro-esophageal reflux disease with esophagitis, without bleeding: Secondary | ICD-10-CM

## 2023-08-11 ENCOUNTER — Other Ambulatory Visit: Payer: Self-pay | Admitting: Family Medicine

## 2023-08-11 DIAGNOSIS — I10 Essential (primary) hypertension: Secondary | ICD-10-CM

## 2023-08-17 ENCOUNTER — Telehealth: Payer: Self-pay

## 2023-08-17 NOTE — Telephone Encounter (Signed)
 Copied from CRM [id number]. Topic: General - Other >> Aug 17, 2023  3:48 PM Fredrich Romans wrote: Reason for CRM: Patient called in stating that [employer] sent over a form to the office to be completed by provider for her to get a larger size in depends.She would like to know that status f the form being completed and sent back.She stated that she has one pack left.

## 2023-08-18 ENCOUNTER — Other Ambulatory Visit: Payer: Self-pay | Admitting: Nurse Practitioner

## 2023-08-18 DIAGNOSIS — N393 Stress incontinence (female) (male): Secondary | ICD-10-CM

## 2023-08-18 MED ORDER — MISC. DEVICES MISC: 0 refills | Status: AC

## 2023-08-18 NOTE — Telephone Encounter (Signed)
 Order has been faxed

## 2023-08-18 NOTE — Telephone Encounter (Signed)
 Waiting for forms to be faxed to office

## 2023-08-19 ENCOUNTER — Telehealth: Payer: Self-pay

## 2023-08-19 NOTE — Telephone Encounter (Signed)
 Call placed to patient unable to reach message left on VM. Call to Advised forms have been faxed per note from 08/18/2022

## 2023-08-19 NOTE — Telephone Encounter (Signed)
 Copied from CRM [id number]. Topic: Clinical - Prescription Issue >> Aug 19, 2023 12:23 PM Carlatta H wrote: Reason for CRM: Per the patient [employer] did not receive the order for the patients briefs//Please resend and contact the patient to advise

## 2023-08-19 NOTE — Telephone Encounter (Signed)
 Copied from CRM [id number]. Topic: Clinical - Prescription Issue >> Aug 19, 2023 10:10 AM Everette C wrote: Reason for CRM: The patient has been in contact with [facility] regarding the order for their briefs  The patient has been directed to contact their PCP and request an update on the status of their order and to confirm that it has been placed. The patient has stressed the urgency of their concern and their desire for a response  Please contact when possible

## 2023-08-19 NOTE — Telephone Encounter (Signed)
 Pls see below

## 2023-08-20 ENCOUNTER — Other Ambulatory Visit: Payer: Self-pay | Admitting: Nurse Practitioner

## 2023-08-20 DIAGNOSIS — N393 Stress incontinence (female) (male): Secondary | ICD-10-CM

## 2023-08-20 NOTE — Telephone Encounter (Signed)
 Copied from CRM [id number]. Topic: Clinical - Medication Refill >> Aug 20, 2023 10:31 AM Nyra Capes wrote: [name] from [facility] calling in, refill for Incontinence pad was faxed in to provider on 03/17  fax # [phone]  Incontinence pads need to be shipped to patient home on or before 09/01/23   Most Recent Primary Care Visit:  Provider: Grayce Sessions  Department: [facility]  Visit Type: OFFICE VISIT  Date: 07/02/2023  Medication: Misc. Devices MISC   Please provide patient with extra-large sized incontinence briefs N39.3  Has the patient contacted their pharmacy? No (Agent: If no, request that the patient contact the pharmacy for the refill. If patient does not wish to contact the pharmacy document the reason why and proceed with request.) (Agent: If yes, when and what did the pharmacy advise?)  Is this the correct pharmacy for this prescription? Yes [facility] will be delivering pads to patients home    Has the prescription been filled recently? Yes  Is the patient out of the medication? No  Has the patient been seen for an appointment in the last year OR does the patient have an upcoming appointment? Yes  Can we respond through MyChart? No  Agent: Please be advised that Rx refills may take up to 3 business days. We ask that you follow-up with your pharmacy.

## 2023-08-20 NOTE — Telephone Encounter (Signed)
 Order has been faxed again.

## 2023-08-21 ENCOUNTER — Telehealth: Payer: Self-pay | Admitting: Nurse Practitioner

## 2023-08-21 NOTE — Telephone Encounter (Signed)
 [employer] agent called in stating that she was waiting for a fax to be sent. Per telephone encounter was sent by nurse saying fax was sent on 3/20 and they tried reaching out via phone but could not reach pt. Please advise.

## 2023-08-24 ENCOUNTER — Other Ambulatory Visit: Payer: Self-pay | Admitting: Neurology

## 2023-08-24 ENCOUNTER — Telehealth: Payer: Self-pay

## 2023-08-24 ENCOUNTER — Other Ambulatory Visit: Payer: Self-pay | Admitting: Nurse Practitioner

## 2023-08-24 ENCOUNTER — Ambulatory Visit: Payer: Self-pay

## 2023-08-24 DIAGNOSIS — I10 Essential (primary) hypertension: Secondary | ICD-10-CM

## 2023-08-24 NOTE — Telephone Encounter (Signed)
 Chief Complaint: Heartburn  Symptoms: Center of the chest burning feeling 10/10, feels exactly like her previously diagnosed heartburn Frequency: Comes and goes  Pertinent Negatives: Patient denies nausea, vomiting Disposition: [] ED /[] Urgent Care (no appt availability in office) / [] Appointment(In office/virtual)/ []  [facility]/ [] Home Care/ [x] Refused Recommended Disposition /[] [facility]/ []  Follow-up with PCP Additional Notes: Patient states she is experiencing heartburn in the center of her chest. Patient states it feels the same as it always does but the medication Pantoprazole is not helping. Patient states she knows it is heartburn and drunk a whole bottle of pepto-bismol today. Care advice was given and patient was offered an appointment today, Patient declined stating she does not like that office. Urgent care was offered and she stated she does not "F" with urgent care she need to see a real doctor. Patient states she would go to the ED of PCP does not prescribe something for this heartburn. Advised I would forward request to PCP for additional recommendations. Patient's preferred pharmacy is  [facility] Copied from CRM [id number]. Topic: Clinical - Red Word Triage >> Aug 24, 2023  2:14 PM Fredrica W wrote: Red Word that prompted transfer to Nurse Triage: Severe heartburn in chest, killing her, unable to eat or lay down, painful Reason for Disposition  [1] Patient says chest pain feels exactly the same as previously diagnosed "heartburn" AND [2] describes burning in chest AND [3] accompanying sour taste in mouth  Answer Assessment - Initial Assessment Questions 1. LOCATION: "Where does it hurt?"       Center of the chest  2. RADIATION: "Does the pain go anywhere else?" (e.g., into neck, jaw, arms, back)     No  3. ONSET: "When did the chest pain begin?" (Minutes, hours or days)      Last week  4. PATTERN: "Does the pain come and go, or has it been  constant since it started?"  "Does it get worse with exertion?"      Come and go  5. DURATION: "How long does it last" (e.g., seconds, minutes, hours)     Few minutes at a time  6. SEVERITY: "How bad is the pain?"  (e.g., Scale 1-10; mild, moderate, or severe)    - MILD (1-3): doesn't interfere with normal activities     - MODERATE (4-7): interferes with normal activities or awakens from sleep    - SEVERE (8-10): excruciating pain, unable to do any normal activities       10/10 7. CARDIAC RISK FACTORS: "Do you have any history of heart problems or risk factors for heart disease?" (e.g., angina, prior heart attack; diabetes, high blood pressure, high cholesterol, smoker, or strong family history of heart disease)     Heart burn   8. PULMONARY RISK FACTORS: "Do you have any history of lung disease?"  (e.g., blood clots in lung, asthma, emphysema, birth control pills)     N/A 9. CAUSE: "What do you think is causing the chest pain?"     Heart burn  10. OTHER SYMPTOMS: "Do you have any other symptoms?" (e.g., dizziness, nausea, vomiting, sweating, fever, difficulty breathing, cough)       It's just heart burn  Protocols used: Chest Pain-A-AH

## 2023-08-24 NOTE — Telephone Encounter (Signed)
 Copied from CRM [id number]. Topic: General - Other >> Aug 24, 2023 10:53 AM Fredrich Romans wrote: Reason for CRM: Patient called in stating that [employer] said that they received the paperwork however it wasn't finished so they sent it back to be completed the correct way.She said that she really needs the depends.

## 2023-08-25 ENCOUNTER — Other Ambulatory Visit: Payer: Self-pay | Admitting: Nurse Practitioner

## 2023-08-25 DIAGNOSIS — K2100 Gastro-esophageal reflux disease with esophagitis, without bleeding: Secondary | ICD-10-CM

## 2023-08-25 MED ORDER — PANTOPRAZOLE SODIUM 40 MG PO TBEC: 40.0000 mg | DELAYED_RELEASE_TABLET | Freq: Every day | ORAL | 1 refills | Status: DC

## 2023-08-25 NOTE — Telephone Encounter (Signed)
 Medication sent.

## 2023-08-25 NOTE — Telephone Encounter (Unsigned)
 Copied from CRM [id number]. Topic: General - Call Back - No Documentation >> Aug 25, 2023  1:04 PM Brandy Roberts wrote: Reason for CRM: Patient is returning a call but no notes showing the reason for the call. Callback number is [phone]

## 2023-08-25 NOTE — Telephone Encounter (Signed)
 Order faxed 08/05/23.

## 2023-09-01 ENCOUNTER — Ambulatory Visit: Attending: Nurse Practitioner

## 2023-09-01 VITALS — Ht 66.0 in | Wt 195.0 lb

## 2023-09-01 DIAGNOSIS — Z0000 Encounter for general adult medical examination without abnormal findings: Secondary | ICD-10-CM

## 2023-09-01 NOTE — Patient Instructions (Addendum)
 Brandy Roberts , Thank you for taking time to come for your Medicare Wellness Visit. I appreciate your ongoing commitment to your health goals. Please review the following plan we discussed and let me know if I can assist you in the future.   Referrals/Orders/Follow-Ups/Clinician Recommendations: Yes; Keep maintaining your health by keeping your appointments with Bertram Denver, NP and any specialists that you may see.  Call us if you need anything.  Have a great year!!!!  This is a list of the screening recommended for you and due dates:  Health Maintenance  Topic Date Due   COVID-19 Vaccine (1) Never done   Zoster (Shingles) Vaccine (1 of 2) 09/30/2023*   Pneumococcal Vaccination (1 of 2 - PCV) 07/01/2024*   Mammogram  09/09/2023   Yearly kidney health urinalysis for diabetes  12/12/2023   Pap with HPV screening  05/16/2024   Yearly kidney function blood test for diabetes  07/01/2024   Medicare Annual Wellness Visit  08/31/2024   Hepatitis C Screening  Completed   HIV Screening  Completed   HPV Vaccine  Aged Out   DTaP/Tdap/Td vaccine  Discontinued   Flu Shot  Discontinued   Colon Cancer Screening  Discontinued  *Topic was postponed. The date shown is not the original due date.    Advanced directives: (Copy Requested) Please bring a copy of your health care power of attorney and living will to the office to be added to your chart at your convenience. You can mail to [facility] [address] [place], [place] [address] or email to [email or web address]  Next Medicare Annual Wellness Visit scheduled for next year: Yes

## 2023-09-01 NOTE — Progress Notes (Signed)
 Because this visit was a virtual/telehealth visit,  certain criteria was not obtained, such a blood pressure, CBG if applicable, and timed get up and go. Any medications not marked as "taking" were not mentioned during the medication reconciliation part of the visit. Any vitals not documented were not able to be obtained due to this being a telehealth visit or patient was unable to self-report a recent blood pressure reading due to a lack of equipment at home via telehealth. Vitals that have been documented are verbally provided by the patient.   Subjective:   Brandy Roberts is a 53 y.o. who presents for a Medicare Wellness preventive visit.  Visit Complete: Virtual I connected with  Brandy Roberts on 09/01/23 by a audio enabled telemedicine application and verified that I am speaking with the correct person using two identifiers.  Patient Location: Home  Provider Location: Office/Clinic  I discussed the limitations of evaluation and management by telemedicine. The patient expressed understanding and agreed to proceed.  Vital Signs: Because this visit was a virtual/telehealth visit, some criteria may be missing or patient reported. Any vitals not documented were not able to be obtained and vitals that have been documented are patient reported.  VideoDeclined- This patient declined Librarian, academic. Therefore the visit was completed with audio only.  Persons Participating in Visit: Patient.  AWV Questionnaire: No: Patient Medicare AWV questionnaire was not completed prior to this visit.  Cardiac Risk Factors include: advanced age (>66men, >78 women)     Objective:    Today's Vitals   09/01/23 0913  Weight: 195 lb (88.5 kg)  Height: 5\' 6"  (1.676 m)  PainSc: 0-No pain   Body mass index is 31.47 kg/m.     09/01/2023    9:15 AM 10/31/2022    8:51 AM 07/28/2022   10:43 AM 10/30/2021    3:06 PM 09/17/2021    8:05 AM 06/04/2021    9:58 AM 05/07/2021    11:10 AM  Advanced Directives  Does Patient Have a Medical Advance Directive? Yes No No No No No No  Type of Estate agent of [place];Living will        Copy of Healthcare Power of Attorney in Chart? No - copy requested        Would patient like information on creating a medical advance directive?   Yes (ED - Information included in AVS)  No - Patient declined  Yes (MAU/Ambulatory/Procedural Areas - Information given)    Current Medications (verified) Outpatient Encounter Medications as of 09/01/2023  Medication Sig   amLODipine (NORVASC) 10 MG tablet TAKE 1 TABLET BY MOUTH EVERY DAY FOR BLOOD PRESSURE   Atogepant (QULIPTA) 60 MG TABS Take 1 tablet (60 mg total) by mouth daily.   atorvastatin (LIPITOR) 80 MG tablet TAKE 1 TABLET BY MOUTH EVERY DAY AT 6PM FOR CHOLESTEROL   busPIRone (BUSPAR) 10 MG tablet Take 1 tablet (10 mg total) by mouth 3 (three) times daily.   chlorhexidine (HIBICLENS) 4 % external liquid Apply topically daily as needed.   clopidogrel (PLAVIX) 75 MG tablet TAKE 1 TABLET BY MOUTH EVERY DAY   Elastic Bandages & Supports (POST-OP SHOE/SOFT TOP WOMEN) MISC 1 each by Does not apply route daily as needed.   flurbiprofen (ANSAID) 100 MG tablet TAKE 1 TABLET BY MOUTH EVERY 8 HOURS AS NEEDED (NOT MORE THAN 3 TABLETS A DAY)   hydrALAZINE (APRESOLINE) 50 MG tablet TAKE 1 TABLET BY MOUTH 3 TIMES DAILY   hydrochlorothiazide (MICROZIDE)  12.5 MG capsule TAKE 1 CAPSULE BY MOUTH DAILY   hydrOXYzine (ATARAX/VISTARIL) 50 MG tablet Take 50 mg by mouth 3 (three) times daily as needed for anxiety or sleep.   lisinopril (ZESTRIL) 10 MG tablet Take 1 tablet (10 mg total) by mouth daily.   metoCLOPramide (REGLAN) 10 MG tablet Take 10 mg by mouth every 6 (six) hours as needed for nausea or headache.   metoprolol tartrate (LOPRESSOR) 25 MG tablet Take 1 tablet (25 mg total) by mouth 2 (two) times daily.   Misc. Devices MISC Please provide patient with extra-large sized  incontinence briefs N39.3   mupirocin ointment (BACTROBAN) 2 % Apply 1 Application topically 2 (two) times daily. Apply to back of head   nitroGLYCERIN (NITROSTAT) 0.4 MG SL tablet Place 1 tablet (0.4 mg total) under the tongue every 5 (five) minutes as needed for chest pain.   pantoprazole (PROTONIX) 40 MG tablet Take 1 tablet (40 mg total) by mouth daily before breakfast.   spironolactone (ALDACTONE) 25 MG tablet TAKE 1 TABLET BY MOUTH EVERY DAY   traZODone (DESYREL) 100 MG tablet TAKE 1 TABLET BY MOUTH EVERY NIGHT AT BEDTIME   venlafaxine XR (EFFEXOR-XR) 75 MG 24 hr capsule TAKE 3 CAPSULES BY MOUTH EVERY MORNING WITH BREAKFAST   No facility-administered encounter medications on file as of 09/01/2023.    Allergies (verified) Ibuprofen and Tylenol [acetaminophen]   History: Past Medical History:  Diagnosis Date   Anemia    Anxiety    Aortic regurgitation    Cardiomyopathy due to hypertension [name] 10/23/2014   Chest discomfort 10/12/2018   Chronic ischemic vertebrobasilar artery thalamic stroke    Depression    Essential hypertension 05/02/2015   Facet arthropathy 05/15/2021   Former smoker 01/24/2015   H/O noncompliance with medical treatment, presenting hazards to health    Headache 05/01/2015   Hemiparesis affecting left side as late effect of cerebrovascular accident [name] 07/11/2022   History of ischemic middle cerebral artery stroke embolic    Hyperlipidemia    Hypertension    Hypertensive heart disease    Lumbar radiculopathy 05/01/2021   Migraine 05/21/2018   Migraines    Mixed dyslipidemia 11/26/2020   Nonrheumatic aortic (valve) insufficiency    Obesity (BMI 35.0-39.9 without comorbidity) 11/26/2020   Osteoarthritis    Overweight (BMI 25.0-29.9)    Palpitations 10/12/2018   SI joint arthritis [name] 05/15/2021   Sleep apnea    Snoring 05/02/2015   Stroke [name] 10/25/2016   Syncope and collapse 10/07/2018   Vitamin D deficiency 05/15/2021   Past Surgical  History:  Procedure Laterality Date   CESAREAN SECTION     GANGLION CYST EXCISION     TEE WITHOUT CARDIOVERSION N/A 10/09/2014   Procedure: TRANSESOPHAGEAL ECHOCARDIOGRAM (TEE);  Surgeon: Thurmon Fair, MD;  Location[facility] ENDOSCOPY;  Service: Cardiovascular;  Laterality: N/A;   TONSILLECTOMY     Family History  Problem Relation Age of Onset   Cancer Mother        type unknown   Hypertension Mother    Hypertension Sister    Diabetes Sister    Cancer Maternal Aunt        4 aunts died of cancer types unknown   Social History   Socioeconomic History   Marital status: Single    Spouse name: Not on file   Number of children: 2   Years of education: 12   Highest education level: High school graduate  Occupational History   Occupation: disable   Tobacco Use  Smoking status: Former    Current packs/day: 0.00    Types: Cigarettes    Quit date: 08/31/2017    Years since quitting: 6.0   Smokeless tobacco: Never  Vaping Use   Vaping status: Never Used  Substance and Sexual Activity   Alcohol use: No    Alcohol/week: 0.0 standard drinks of alcohol   Drug use: Yes    Types: Marijuana   Sexual activity: Not on file  Other Topics Concern   Not on file  Social History Narrative   Patient lives with her uncle in a one story home.  Has 2 sons.  Currently on disability.  Education: high school.   Drinks 1-2 sodas a week       Social Drivers of Corporate investment banker Strain: Low Risk  (09/01/2023)   Overall Financial Resource Strain (CARDIA)    Difficulty of Paying Living Expenses: Not hard at all  Food Insecurity: No Food Insecurity (09/01/2023)   Hunger Vital Sign    Worried About Running Out of Food in the Last Year: Never true    Ran Out of Food in the Last Year: Never true  Transportation Needs: No Transportation Needs (09/01/2023)   PRAPARE - Administrator, Civil Service (Medical): No    Lack of Transportation (Non-Medical): No  Physical Activity: Inactive  (09/01/2023)   Exercise Vital Sign    Days of Exercise per Week: 0 days    Minutes of Exercise per Session: 0 min  Stress: No Stress Concern Present (09/01/2023)   [employer] of Occupational Health - Occupational Stress Questionnaire    Feeling of Stress : Not at all  Social Connections: Socially Isolated (09/01/2023)   Social Connection and Isolation Panel [NHANES]    Frequency of Communication with Friends and Family: Twice a week    Frequency of Social Gatherings with Friends and Family: Twice a week    Attends Religious Services: Never    Database administrator or Organizations: No    Attends Engineer, structural: Never    Marital Status: Never married    Tobacco Counseling Counseling given: Not Answered    Clinical Intake:  Pre-visit preparation completed: Yes  Pain : No/denies pain Pain Score: 0-No pain     BMI - recorded: 31.47 Nutritional Status: BMI > 30  Obese Nutritional Risks: None Diabetes: No  Lab Results  Component Value Date   HGBA1C 6.4 (H) 07/02/2023   HGBA1C 5.7 (H) 12/12/2022   HGBA1C 5.7 (H) 07/11/2022     How often do you need to have someone help you when you read instructions, pamphlets, or other written materials from your doctor or pharmacy?: 1 - Never  Interpreter Needed?: No  Information entered by :: Donivin Wirt N. Joon Pohle, LPN.   Activities of Daily Living     09/01/2023    9:18 AM  In your present state of health, do you have any difficulty performing the following activities:  Hearing? 0  Vision? 0  Difficulty concentrating or making decisions? 1  Walking or climbing stairs? 1  Dressing or bathing? 1  Doing errands, shopping? 1  Preparing Food and eating ? N  Using the Toilet? N  In the past six months, have you accidently leaked urine? Y  Comment at night  Do you have problems with loss of bowel control? N  Managing your Medications? N  Managing your Finances? N  Housekeeping or managing your Housekeeping? Y     Patient Care  Team: Claiborne Rigg, NP as PCP - General (Nurse Practitioner) Revankar, Aundra Dubin, MD as PCP - Cardiology (Cardiology) Drema Dallas, DO as Consulting Physician (Neurology) Aura Camps, MD as Consulting Physician (Ophthalmology)  Indicate any recent Medical Services you may have received from other than [facility] providers in the past year (date may be approximate).     Assessment:   This is a routine wellness examination for [place].  Hearing/Vision screen Hearing Screening - Comments:: Denies hearing difficulties.  Vision Screening - Comments:: Wears rx glasses - up to date with routine eye exams with Aura Camps, MD.    Goals Addressed             This Visit's Progress    To maintain my health the best that I can.         Depression Screen     09/01/2023    9:20 AM 07/02/2023   10:31 AM 03/10/2023    9:35 AM 12/12/2022    9:36 AM 07/28/2022   10:44 AM 07/11/2022    9:24 AM 04/09/2022    9:44 AM  PHQ 2/9 Scores  PHQ - 2 Score 6 2 4  2 2 3   PHQ- 9 Score 8 9 18  6 9 17   Exception Documentation    Patient refusal       Fall Risk     09/01/2023    9:17 AM 03/10/2023    9:31 AM 12/12/2022    9:36 AM 10/31/2022    8:50 AM 07/28/2022   10:44 AM  Fall Risk   Falls in the past year? 1 1 1 1 1   Number falls in past yr: 1 0 1 0 1  Injury with Fall? 0 0 0 0 0  Risk for fall due to : History of fall(s);Impaired balance/gait;Orthopedic patient Impaired mobility;Impaired balance/gait   History of fall(s)  Follow up Falls prevention discussed;Falls evaluation completed Falls evaluation completed  Falls evaluation completed Falls prevention discussed    MEDICARE RISK AT HOME:  Medicare Risk at Home Any stairs in or around the home?: No If so, are there any without handrails?: No Home free of loose throw rugs in walkways, pet beds, electrical cords, etc?: Yes Adequate lighting in your home to reduce risk of falls?: Yes Life alert?: No Use of a cane, walker  or w/c?: Yes Grab bars in the bathroom?: Yes (in the shower) Shower chair or bench in shower?: No Elevated toilet seat or a handicapped toilet?: No  TIMED UP AND GO:  Was the test performed?  No  Cognitive Function: 6CIT completed    09/01/2023    9:18 AM  MMSE - Mini Mental State Exam  Not completed: Unable to complete        09/01/2023    9:18 AM 07/28/2022   10:45 AM  6CIT Screen  What Year? 0 points 0 points  What month? 0 points 0 points  What time? 0 points 0 points  Count back from 20 0 points 0 points  Months in reverse 0 points 0 points  Repeat phrase 8 points 0 points  Total Score 8 points 0 points    Immunizations Immunization History  Administered Date(s) Administered   Influenza, Seasonal, Injecte, Preservative Fre 03/10/2023    Screening Tests Health Maintenance  Topic Date Due   COVID-19 Vaccine (1) Never done   Zoster Vaccines- Shingrix (1 of 2) 09/30/2023 (Originally 08/21/1989)   Pneumococcal Vaccine 70-64 Years old (1 of 2 - PCV) 07/01/2024 (Originally  08/22/1976)   MAMMOGRAM  09/09/2023   Diabetic kidney evaluation - Urine ACR  12/12/2023   Cervical Cancer Screening (HPV/Pap Cotest)  05/16/2024   Diabetic kidney evaluation - eGFR measurement  07/01/2024   Medicare Annual Wellness (AWV)  08/31/2024   Hepatitis C Screening  Completed   HIV Screening  Completed   HPV VACCINES  Aged Out   DTaP/Tdap/Td  Discontinued   INFLUENZA VACCINE  Discontinued   Colonoscopy  Discontinued    Health Maintenance  Health Maintenance Due  Topic Date Due   COVID-19 Vaccine (1) Never done   Health Maintenance Items Addressed: See Nurse Notes  Additional Screening:  Vision Screening: Recommended annual ophthalmology exams for early detection of glaucoma and other disorders of the eye.  Dental Screening: Recommended annual dental exams for proper oral hygiene  Community Resource Referral / Chronic Care Management: CRR required this visit?  No   CCM  required this visit?  No     Plan:     I have personally reviewed and noted the following in the patient's chart:   Medical and social history Use of alcohol, tobacco or illicit drugs  Current medications and supplements including opioid prescriptions. Patient is not currently taking opioid prescriptions. Functional ability and status Nutritional status Physical activity Advanced directives List of other physicians Hospitalizations, surgeries, and ER visits in previous 12 months Vitals Screenings to include cognitive, depression, and falls Referrals and appointments  In addition, I have reviewed and discussed with patient certain preventive protocols, quality metrics, and best practice recommendations. A written personalized care plan for preventive services as well as general preventive health recommendations were provided to patient.     Mickeal Needy, LPN   07/12/5619   After Visit Summary: (Mail) Due to this being a telephonic visit, the after visit summary with patients personalized plan was offered to patient via mail   Notes: Nothing significant to report at this time.

## 2023-09-01 NOTE — Progress Notes (Signed)
 I have reviewed and agree with the AWV documentation Attestation signed by Bertram Denver FNP-BC on  09-01-2023

## 2023-09-09 ENCOUNTER — Telehealth: Payer: Self-pay | Admitting: Nurse Practitioner

## 2023-09-09 NOTE — Telephone Encounter (Signed)
 FYI  Copied from CRM [id number]. Topic: Clinical - Request for Lab/Test Order >> Sep 09, 2023 10:16 AM Truddie Crumble wrote:  Reason for CRM: patient called stating she need to get blood work done for her mental health [phone]

## 2023-09-09 NOTE — Telephone Encounter (Signed)
 Who is the therapist and they need to fax the reason for request. She just had labs in January.

## 2023-09-09 NOTE — Telephone Encounter (Signed)
 Patient is requesting for labs to be drawn. Labs have been requested from the therapist. A1C,LIPID,BUN,CBC,CREATINE, THYROID/TSH AND ALT.

## 2023-09-10 NOTE — Telephone Encounter (Signed)
 Patient identified by name and date of birth.  Patient stated that she will drop of the request forms Monday.

## 2023-09-15 ENCOUNTER — Other Ambulatory Visit: Payer: Self-pay | Admitting: Family Medicine

## 2023-09-15 DIAGNOSIS — Z8673 Personal history of transient ischemic attack (TIA), and cerebral infarction without residual deficits: Secondary | ICD-10-CM

## 2023-09-15 DIAGNOSIS — I10 Essential (primary) hypertension: Secondary | ICD-10-CM

## 2023-09-16 NOTE — Telephone Encounter (Signed)
 Requested Prescriptions  Pending Prescriptions Disp Refills   hydrALAZINE (APRESOLINE) 50 MG tablet [Pharmacy Med Name: HYDRALAZINE HCL 50 MG TAB] 270 tablet 0    Sig: TAKE 1 TABLET BY MOUTH 3 TIMES DAILY     Cardiovascular:  Vasodilators Failed - 09/16/2023  9:23 AM      Failed - ANA Screen, Ifa, Serum in normal range and within 360 days    No results found for: "ANA", "ANATITER", "LABANTI"       Passed - HCT in normal range and within 360 days    Hematocrit  Date Value Ref Range Status  07/02/2023 43.2 34.0 - 46.6 % Final         Passed - HGB in normal range and within 360 days    Hemoglobin  Date Value Ref Range Status  07/02/2023 13.6 11.1 - 15.9 g/dL Final         Passed - RBC in normal range and within 360 days    RBC  Date Value Ref Range Status  07/02/2023 4.87 3.77 - 5.28 x10E6/uL Final  10/05/2019 4.61 3.87 - 5.11 MIL/uL Final         Passed - WBC in normal range and within 360 days    WBC  Date Value Ref Range Status  07/02/2023 5.4 3.4 - 10.8 x10E3/uL Final  10/05/2019 12.7 (H) 4.0 - 10.5 K/uL Final         Passed - PLT in normal range and within 360 days    Platelets  Date Value Ref Range Status  07/02/2023 242 150 - 450 x10E3/uL Final         Passed - Last BP in normal range    BP Readings from Last 1 Encounters:  07/02/23 118/66         Passed - Valid encounter within last 12 months    Recent Outpatient Visits           2 months ago Essential hypertension   [place] Grayce Sessions, NP   4 months ago Stress incontinence   [facility] [place] [facility] Dept Of [place]. [facility] Claiborne Rigg, NP   6 months ago Encounter for annual physical exam   [facility] Of [place]. [facility] Claiborne Rigg, NP   9 months ago Primary hypertension   [facility] [facility] [place] [facility] Dept Of [place]. [facility] Claiborne Rigg, NP   1 year ago  Primary hypertension   [place] [place] - A Dept Of . [facility] Claiborne Rigg, NP       Future Appointments             In 2 weeks Claiborne Rigg, NP [facility] [place] - A Dept Of [place]igha Bridegroom. [facility]             clopidogrel (PLAVIX) 75 MG tablet [Pharmacy Med Name: CLOPIDOGREL BISULFATE 75 MG TAB] 90 tablet 0    Sig: TAKE 1 TABLET BY MOUTH EVERY DAY     Hematology: Antiplatelets - clopidogrel Failed - 09/16/2023  9:23 AM      Failed - Cr in normal range and within 360 days    Creat  Date Value Ref Range Status  07/24/2016 1.01 0.50 - 1.10 mg/dL Final   Creatinine, Ser  Date Value Ref Range Status  07/02/2023 1.09 (  H) 0.57 - 1.00 mg/dL Final         Passed - HCT in normal range and within 180 days    Hematocrit  Date Value Ref Range Status  07/02/2023 43.2 34.0 - 46.6 % Final         Passed - HGB in normal range and within 180 days    Hemoglobin  Date Value Ref Range Status  07/02/2023 13.6 11.1 - 15.9 g/dL Final         Passed - PLT in normal range and within 180 days    Platelets  Date Value Ref Range Status  07/02/2023 242 150 - 450 x10E3/uL Final         Passed - Valid encounter within last 6 months    Recent Outpatient Visits           2 months ago Essential hypertension   [place] [facility] Marius Siemens, NP   4 months ago Stress incontinence   [place] [facility] [place] - A Dept Of [place]. [facility] Collins Dean, NP   6 months ago Encounter for annual physical exam   [place] [facility] [place] - A Dept Of [place]. [facility] Collins Dean, NP   9 months ago Primary hypertension   [place] [facility] [place] - A Dept Of [place]. [facility] Collins Dean, NP   1 year ago Primary hypertension   [place] [facility] [place] - A Dept Of [place]. [facility] Collins Dean, NP        Future Appointments             In 2 weeks Collins Dean, NP [facility] [place] - A Dept Of [place]. [facility]

## 2023-09-23 ENCOUNTER — Other Ambulatory Visit: Payer: Self-pay | Admitting: Neurology

## 2023-09-28 ENCOUNTER — Other Ambulatory Visit: Payer: Self-pay | Admitting: Neurology

## 2023-09-28 MED ORDER — FLURBIPROFEN 100 MG PO TABS: ORAL_TABLET | ORAL | 5 refills | Status: DC

## 2023-09-28 NOTE — Telephone Encounter (Signed)
 Patient needs a refill on this medication, pharmacy told her to have it called into a  different pharmacy to see if they could get it  Flurbiprofen  100 mg  [facility]  [place] [address]

## 2023-09-28 NOTE — Telephone Encounter (Signed)
 LMOVM please call to clarify which pharmacy. Couldn't find the one she needed in the system.

## 2023-09-28 NOTE — Telephone Encounter (Signed)
 Pt. Called and verified [facility], [address] [place], [place] [address]

## 2023-09-30 ENCOUNTER — Telehealth: Payer: Self-pay | Admitting: Neurology

## 2023-09-30 ENCOUNTER — Encounter: Payer: Self-pay | Admitting: Nurse Practitioner

## 2023-09-30 ENCOUNTER — Ambulatory Visit: Payer: 59 | Attending: Nurse Practitioner | Admitting: Nurse Practitioner

## 2023-09-30 VITALS — BP 111/68 | HR 58 | Resp 19 | Ht 66.0 in | Wt 195.6 lb

## 2023-09-30 DIAGNOSIS — Z8673 Personal history of transient ischemic attack (TIA), and cerebral infarction without residual deficits: Secondary | ICD-10-CM | POA: Diagnosis not present

## 2023-09-30 DIAGNOSIS — Z1231 Encounter for screening mammogram for malignant neoplasm of breast: Secondary | ICD-10-CM | POA: Diagnosis not present

## 2023-09-30 DIAGNOSIS — F418 Other specified anxiety disorders: Secondary | ICD-10-CM

## 2023-09-30 DIAGNOSIS — Z1211 Encounter for screening for malignant neoplasm of colon: Secondary | ICD-10-CM

## 2023-09-30 DIAGNOSIS — I10 Essential (primary) hypertension: Secondary | ICD-10-CM

## 2023-09-30 DIAGNOSIS — F5105 Insomnia due to other mental disorder: Secondary | ICD-10-CM

## 2023-09-30 DIAGNOSIS — R7989 Other specified abnormal findings of blood chemistry: Secondary | ICD-10-CM | POA: Diagnosis not present

## 2023-09-30 MED ORDER — AMLODIPINE BESYLATE 10 MG PO TABS
10.0000 mg | ORAL_TABLET | Freq: Every day | ORAL | 1 refills | Status: AC
Start: 2023-09-30 — End: ?

## 2023-09-30 MED ORDER — VENLAFAXINE HCL ER 75 MG PO CP24: ORAL_CAPSULE | ORAL | 1 refills | Status: DC

## 2023-09-30 MED ORDER — LISINOPRIL 10 MG PO TABS: 10.0000 mg | ORAL_TABLET | Freq: Every day | ORAL | 1 refills | Status: DC

## 2023-09-30 MED ORDER — SPIRONOLACTONE 25 MG PO TABS: 25.0000 mg | ORAL_TABLET | Freq: Every day | ORAL | 1 refills | Status: AC

## 2023-09-30 MED ORDER — CLOPIDOGREL BISULFATE 75 MG PO TABS: 75.0000 mg | ORAL_TABLET | Freq: Every day | ORAL | 3 refills | Status: AC

## 2023-09-30 MED ORDER — HYDRALAZINE HCL 50 MG PO TABS: 50.0000 mg | ORAL_TABLET | Freq: Three times a day (TID) | ORAL | 1 refills | Status: AC

## 2023-09-30 MED ORDER — NITROGLYCERIN 0.4 MG SL SUBL: 0.4000 mg | SUBLINGUAL_TABLET | SUBLINGUAL | 1 refills | Status: AC | PRN

## 2023-09-30 MED ORDER — FLURBIPROFEN 100 MG PO TABS: ORAL_TABLET | ORAL | 5 refills | Status: AC

## 2023-09-30 MED ORDER — TRAZODONE HCL 100 MG PO TABS: 100.0000 mg | ORAL_TABLET | Freq: Every day | ORAL | 1 refills | Status: AC

## 2023-09-30 NOTE — Progress Notes (Signed)
 Assessment & Plan:  Vader was seen today for medical management of chronic issues.  Diagnoses and all orders for this visit:  Primary hypertension -     amLODipine  (NORVASC ) 10 MG tablet; Take 1 tablet (10 mg total) by mouth daily. -     hydrALAZINE  (APRESOLINE ) 50 MG tablet; Take 1 tablet (50 mg total) by mouth 3 (three) times daily. -     spironolactone  (ALDACTONE ) 25 MG tablet; Take 1 tablet (25 mg total) by mouth daily. -     lisinopril  (ZESTRIL ) 10 MG tablet; Take 1 tablet (10 mg total) by mouth daily. -     CMP14+EGFR  History of ischemic middle cerebral artery stroke embolic -     clopidogrel  (PLAVIX ) 75 MG tablet; Take 1 tablet (75 mg total) by mouth daily. -     Lipid panel  Breast cancer screening by mammogram -     MM 3D SCREENING MAMMOGRAM BILATERAL BREAST; Future  Abnormal TSH -     CBC with Differential -     Thyroid  Panel With TSH  Colon cancer screening -     Fecal occult blood, imunochemical([facility])  Insomnia secondary to depression with anxiety -     traZODone  (DESYREL ) 100 MG tablet; Take 1 tablet (100 mg total) by mouth at bedtime. -     venlafaxine  XR (EFFEXOR -XR) 75 MG 24 hr capsule; TAKE 3 CAPSULES BY MOUTH EVERY MORNING WITH BREAKFAST  Other orders -     nitroGLYCERIN  (NITROSTAT ) 0.4 MG SL tablet; Place 1 tablet (0.4 mg total) under the tongue every 5 (five) minutes as needed for chest pain.    Patient has been counseled on age-appropriate routine health concerns for screening and prevention. These are reviewed and up-to-date. Referrals have been placed accordingly. Immunizations are up-to-date or declined.    Subjective:   Chief Complaint  Patient presents with   Medical Management of Chronic Issues    Brandy Roberts 53 y.o. female presents to office today for follow up to HTN  She has a past medical history of Anemia, Anxiety, Aortic regurgitation, Cardiomyopathy due to hypertension (10/23/2014), Chronic ischemic vertebrobasilar  artery thalamic stroke, Depression, Essential hypertension (05/02/2015), Former smoker (01/24/2015), H/O noncompliance with medical treatment, presenting hazards to health, Headache (05/01/2015), History of ischemic middle cerebral artery stroke embolic, Hyperlipidemia, Hypertension, Hypertensive heart disease, Migraine (05/21/2018), Migraines, Nonrheumatic aortic (valve) insufficiency, Osteoarthritis, Overweight (BMI 25.0-29.9), Palpitations (10/12/2018), Sleep apnea, Snoring (05/02/2015), Stroke (10/25/2016), and Syncope and collapse (10/07/2018).     HTN Blood pressure is well controlled with amlodipine  10 mg daily, hydralazine  50 mg TID, lisinopril  10mg  daily, lopressor  25 mg BID, and spirinolactone 25 mg daily as prescribed BP Readings from Last 3 Encounters:  09/30/23 111/68  07/02/23 118/66  04/22/23 112/64     Reports pain in both hips and thighs. She has been doing a lot of sitting and not very active. Denies any numbness or tingling. She has urinary incontinence however this is not new. Pain described as aching.  Declines physical therapy referral, acetaminophen  or ibuprofen  due to intolerance.   Review of Systems  Constitutional:  Negative for fever, malaise/fatigue and weight loss.  HENT: Negative.  Negative for nosebleeds.   Eyes: Negative.  Negative for blurred vision, double vision and photophobia.  Respiratory: Negative.  Negative for cough and shortness of breath.   Cardiovascular: Negative.  Negative for chest pain, palpitations and leg swelling.  Gastrointestinal: Negative.  Negative for heartburn, nausea and vomiting.  Musculoskeletal:  Positive for joint pain  and myalgias.  Neurological:  Positive for weakness (Hx of CVA with residual deficits). Negative for dizziness, focal weakness, seizures and headaches.  Psychiatric/Behavioral: Negative.  Negative for suicidal ideas.     Past Medical History:  Diagnosis Date   Anemia    Anxiety    Aortic regurgitation     Cardiomyopathy due to hypertension [name] 10/23/2014   Chest discomfort 10/12/2018   Chronic ischemic vertebrobasilar artery thalamic stroke    Depression    Essential hypertension 05/02/2015   Facet arthropathy 05/15/2021   Former smoker 01/24/2015   H/O noncompliance with medical treatment, presenting hazards to health    Headache 05/01/2015   Hemiparesis affecting left side as late effect of cerebrovascular accident [name] 07/11/2022   History of ischemic middle cerebral artery stroke embolic    Hyperlipidemia    Hypertension    Hypertensive heart disease    Lumbar radiculopathy 05/01/2021   Migraine 05/21/2018   Migraines    Mixed dyslipidemia 11/26/2020   Nonrheumatic aortic (valve) insufficiency    Obesity (BMI 35.0-39.9 without comorbidity) 11/26/2020   Osteoarthritis    Overweight (BMI 25.0-29.9)    Palpitations 10/12/2018   SI joint arthritis [name] 05/15/2021   Sleep apnea    Snoring 05/02/2015   Stroke [name] 10/25/2016   Syncope and collapse 10/07/2018   Vitamin D  deficiency 05/15/2021    Past Surgical History:  Procedure Laterality Date   CESAREAN SECTION     GANGLION CYST EXCISION     TEE WITHOUT CARDIOVERSION N/A 10/09/2014   Procedure: TRANSESOPHAGEAL ECHOCARDIOGRAM (TEE);  Surgeon: Luana Rumple, MD;  [facility] END[facility]COPY;  Service: Cardiovascular;  Laterality: N/A;   TONSILLECTOMY      Family History  Problem Relation Age of Onset   Cancer Mother        type unknown   Hypertension Mother    Hypertension Sister    Diabetes Sister    Cancer Maternal Aunt        4 aunts died of cancer types unknown    Social History Reviewed with no changes to be made today.   Outpatient Medications Prior to Visit  Medication Sig Dispense Refill   Atogepant  (QULIPTA ) 60 MG TABS Take 1 tablet (60 mg total) by mouth daily. 30 tablet 11   atorvastatin  (LIPITOR ) 80 MG tablet TAKE 1 TABLET BY MOUTH EVERY DAY AT 6PM FOR CHOLESTEROL 90 tablet 1   busPIRone  (BUSPAR ) 10 MG  tablet Take 1 tablet (10 mg total) by mouth 3 (three) times daily. 90 tablet 3   chlorhexidine  (HIBICLENS ) 4 % external liquid Apply topically daily as needed. 1000 mL 1   flurbiprofen  (ANSAID ) 100 MG tablet TAKE 1 TABLET BY MOUTH EVERY 8 HOURS AS NEEDED (NOT MORE THAN 3 TABLETS A DAY) 30 tablet 5   hydrochlorothiazide  (MICROZIDE ) 12.5 MG capsule TAKE 1 CAPSULE BY MOUTH DAILY 90 capsule 1   hydrOXYzine (ATARAX/VISTARIL) 50 MG tablet Take 50 mg by mouth 3 (three) times daily as needed for anxiety or sleep.     metoCLOPramide  (REGLAN ) 10 MG tablet Take 10 mg by mouth every 6 (six) hours as needed for nausea or headache.     metoprolol  tartrate (LOPRESSOR ) 25 MG tablet Take 1 tablet (25 mg total) by mouth 2 (two) times daily. 180 tablet 1   Misc. Devices MISC Please provide patient with extra-large sized incontinence briefs N39.3 1 each 0   mupirocin  ointment (BACTROBAN ) 2 % Apply 1 Application topically 2 (two) times daily. Apply to back of head 200  g 3   pantoprazole  (PROTONIX ) 40 MG tablet Take 1 tablet (40 mg total) by mouth daily before breakfast. 90 tablet 1   amLODipine  (NORVASC ) 10 MG tablet TAKE 1 TABLET BY MOUTH EVERY DAY FOR BLOOD PRESSURE 90 tablet 0   clopidogrel  (PLAVIX ) 75 MG tablet TAKE 1 TABLET BY MOUTH EVERY DAY 90 tablet 0   Elastic Bandages & Supports (POST-OP SHOE/SOFT TOP WOMEN) MISC 1 each by Does not apply route daily as needed. 1 each 0   hydrALAZINE  (APRESOLINE ) 50 MG tablet TAKE 1 TABLET BY MOUTH 3 TIMES DAILY 270 tablet 0   lisinopril  (ZESTRIL ) 10 MG tablet Take 1 tablet (10 mg total) by mouth daily. 90 tablet 1   nitroGLYCERIN  (NITROSTAT ) 0.4 MG SL tablet Place 1 tablet (0.4 mg total) under the tongue every 5 (five) minutes as needed for chest pain. 30 tablet 0   spironolactone  (ALDACTONE ) 25 MG tablet TAKE 1 TABLET BY MOUTH EVERY DAY 90 tablet 0   traZODone  (DESYREL ) 100 MG tablet TAKE 1 TABLET BY MOUTH EVERY NIGHT AT BEDTIME 90 tablet 0   venlafaxine  XR (EFFEXOR -XR) 75  MG 24 hr capsule TAKE 3 CAPSULES BY MOUTH EVERY MORNING WITH BREAKFAST 90 capsule 0   No facility-administered medications prior to visit.    Allergies  Allergen Reactions   Ibuprofen  Other (See Comments)    Can not take with current medications .   Tylenol  [Acetaminophen ] Other (See Comments)    Pt stated tylenol  gives her extreme headache       Objective:    BP 111/68 (BP Location: Left Arm, Patient Position: Sitting, Cuff Size: Normal)   Pulse (!) 58   Resp 19   Ht 5\' 6"  (1.676 m)   Wt 195 lb 9.6 oz (88.7 kg)   LMP 03/19/2016   SpO2 100%   BMI 31.57 kg/m  Wt Readings from Last 3 Encounters:  09/30/23 195 lb 9.6 oz (88.7 kg)  09/01/23 195 lb (88.5 kg)  07/02/23 191 lb 3.2 oz (86.7 kg)    Physical Exam Vitals and nursing note reviewed.  Constitutional:      Appearance: She is well-developed.  HENT:     Head: Normocephalic and atraumatic.  Cardiovascular:     Rate and Rhythm: Normal rate and regular rhythm.     Heart sounds: Normal heart sounds. No murmur heard.    No friction rub. No gallop.  Pulmonary:     Effort: Pulmonary effort is normal. No tachypnea or respiratory distress.     Breath sounds: Normal breath sounds. No decreased breath sounds, wheezing, rhonchi or rales.  Chest:     Chest wall: No tenderness.  Abdominal:     General: Bowel sounds are normal.     Palpations: Abdomen is soft.  Musculoskeletal:        General: Normal range of motion.     Cervical back: Normal range of motion.  Skin:    General: Skin is warm and dry.  Neurological:     Mental Status: She is alert and oriented to person, place, and time.     Coordination: Coordination normal.  Psychiatric:        Behavior: Behavior normal. Behavior is cooperative.        Thought Content: Thought content normal.        Judgment: Judgment normal.          Patient has been counseled extensively about nutrition and exercise as well as the importance of adherence with medications and  regular  follow-up. The patient was given clear instructions to go to ER or return to medical center if symptoms don't improve, worsen or new problems develop. The patient verbalized understanding.   Follow-up: Return in about 3 months (around 12/30/2023).   Collins Dean, FNP-BC [facility] and [facility] [place], [place] [phone]   09/30/2023, 9:11 AM

## 2023-09-30 NOTE — Telephone Encounter (Signed)
 Pt came in and [facility] could not fill Rx not in stock , please send to [facility] Address: [address], [place], [place] [address] Rx Flurbiprofen 

## 2023-09-30 NOTE — Patient Instructions (Signed)
 [facility] Located in: [facility] Address: [address], [place], [place] [address] Phone: [phone]

## 2023-09-30 NOTE — Telephone Encounter (Signed)
Script sent to [facility] as requested.

## 2023-10-01 LAB — CBC WITH DIFFERENTIAL/PLATELET
Basophils Absolute: 0 10*3/uL (ref 0.0–0.2)
Basos: 0 %
EOS (ABSOLUTE): 0.3 10*3/uL (ref 0.0–0.4)
Eos: 4 %
Hematocrit: 43.9 % (ref 34.0–46.6)
Hemoglobin: 14.1 g/dL (ref 11.1–15.9)
Immature Grans (Abs): 0 10*3/uL (ref 0.0–0.1)
Immature Granulocytes: 0 %
Lymphocytes Absolute: 1.9 10*3/uL (ref 0.7–3.1)
Lymphs: 25 %
MCH: 28 pg (ref 26.6–33.0)
MCHC: 32.1 g/dL (ref 31.5–35.7)
MCV: 87 fL (ref 79–97)
Monocytes Absolute: 0.8 10*3/uL (ref 0.1–0.9)
Monocytes: 10 %
Neutrophils Absolute: 4.4 10*3/uL (ref 1.4–7.0)
Neutrophils: 61 %
Platelets: 275 10*3/uL (ref 150–450)
RBC: 5.04 x10E6/uL (ref 3.77–5.28)
RDW: 13.1 % (ref 11.7–15.4)
WBC: 7.4 10*3/uL (ref 3.4–10.8)

## 2023-10-01 LAB — THYROID PANEL WITH TSH
Free Thyroxine Index: 2.3 (ref 1.2–4.9)
T3 Uptake Ratio: 29 % (ref 24–39)
T4, Total: 8 ug/dL (ref 4.5–12.0)
TSH: 2.17 u[IU]/mL (ref 0.450–4.500)

## 2023-10-01 LAB — CMP14+EGFR
ALT: 15 IU/L (ref 0–32)
AST: 12 IU/L (ref 0–40)
Albumin: 4.1 g/dL (ref 3.8–4.9)
Alkaline Phosphatase: 142 IU/L — ABNORMAL HIGH (ref 44–121)
BUN/Creatinine Ratio: 14 (ref 9–23)
BUN: 15 mg/dL (ref 6–24)
Bilirubin Total: 0.2 mg/dL (ref 0.0–1.2)
CO2: 27 mmol/L (ref 20–29)
Calcium: 9.7 mg/dL (ref 8.7–10.2)
Chloride: 98 mmol/L (ref 96–106)
Creatinine, Ser: 1.09 mg/dL — ABNORMAL HIGH (ref 0.57–1.00)
Globulin, Total: 3.4 g/dL (ref 1.5–4.5)
Glucose: 126 mg/dL — ABNORMAL HIGH (ref 70–99)
Potassium: 3.8 mmol/L (ref 3.5–5.2)
Sodium: 140 mmol/L (ref 134–144)
Total Protein: 7.5 g/dL (ref 6.0–8.5)
eGFR: 60 mL/min/{1.73_m2} (ref >59)

## 2023-10-01 LAB — LIPID PANEL
Chol/HDL Ratio: 3.6 ratio (ref 0.0–4.4)
Cholesterol, Total: 149 mg/dL (ref 100–199)
HDL: 41 mg/dL (ref >39)
LDL Chol Calc (NIH): 88 mg/dL (ref 0–99)
Triglycerides: 111 mg/dL (ref 0–149)
VLDL Cholesterol Cal: 20 mg/dL (ref 5–40)

## 2023-10-09 ENCOUNTER — Encounter: Payer: Self-pay | Admitting: Neurology

## 2023-10-09 ENCOUNTER — Ambulatory Visit (INDEPENDENT_AMBULATORY_CARE_PROVIDER_SITE_OTHER): Admitting: Neurology

## 2023-10-09 VITALS — BP 119/72 | HR 57 | Ht 66.0 in | Wt 198.2 lb

## 2023-10-09 DIAGNOSIS — G43009 Migraine without aura, not intractable, without status migrainosus: Secondary | ICD-10-CM | POA: Diagnosis not present

## 2023-10-09 NOTE — Progress Notes (Signed)
 NEUROLOGY FOLLOW UP OFFICE NOTE  Brandy Roberts [id number]  Assessment/Plan:   1.  Migraine without aura, without status migrainosus, not intractable 2. Recurrent Syncope.  MRI of brain and EEG unremarkable. 3.  Frequent falls.  I think this is secondary to her residual left sided weakness.  Encouraged her to use walker instead of cane if needed.       1.  Migraine prophylaxis:  Continue venlafaxine  XR 225mg  daily; no indication to replace Qulipta  now but would consider Nurtec every other day, if needed 3.  Migraine rescue:  will provide samples of Ubrelvy 100mg .  Triptans contraindicated, does not respond to Tylenol  and would rather not use NSAID if possible due to already taking Plavix . 4.  Limit use of pain relievers to no more than 9 days out of the month to prevent risk of rebound or medication-overuse headache. 5.  Keep headache diary 6.  Follow up in 6 months.     Subjective:  Brandy Roberts is a 53 year old right-handed woman with aortic insufficiency, hypertension, hyperlipidemia and history of stroke who follows up for migraines.   UPDATE: Discontinued Qulipta  "awhile ago" due to excessive weight loss.  However, she hasn't had a migraine since last year.  Has not been able to use flurbiprofen  because pharmacies cannot get it.  Intensity: Moderate Duration: Not needed. Frequency: none in past year    Current NSAIDS: none Current analgesics: None Current triptans: None Current ergotamine: None Current anti-emetic:  Reglan  10 mg Current muscle relaxants: None Current anti-anxiolytic: None Current sleep aide: trazodone  Current Antihypertensive medications: metoprolol , amlodipine , lisinopril , amlodipine  10 mg, hydralazine , hydrochlorothiazide , spironolactone  Current Antidepressant medications: Venlafaxine  XR 225 mg Current Anticonvulsant medications:none Current anti-CGRP: none Current Vitamins/Herbal/Supplements: None Current Antihistamines/Decongestants:  None Other therapy: None Other medication:  buspirone , trazodone , Plavix , atorvastatin      Caffeine : Coffee, sweet tea, not daily Alcohol: No Smoker: Cigarettes Diet: Hydrates Exercise: No Depression: Yes; Anxiety: Yes Other pain: Chronic right knee pain Sleep hygiene: Sometimes tosses and turns     HISTORY: MIGRAINES Onset: Migraines since she was young, but worse since her stroke in May 2016. Location:  holocephalic Quality:  pounding Initial Intensity:  10/10 Aura:  no Prodrome:  no Postdrome:  no Associated symptoms: Photophobia, phonophobia.  Rarely nausea.  She has not had any new worse headache of her life, waking up from sleep Initial Duration:  2 days (but Fioricet and ibuprofen  lowers intensity down to 2-3/10) Initial Frequency:  Once or twice a month Frequency of abortive medication: only as needed Triggers: None Relieving factors:  Laying down.  Butalbital , ibuprofen .  Tramadol  helped but she was told not to take it. Activity:  aggravates   Past NSAIDS:  Ibuprofen , naproxen, flurbiprofen  (effective) Past analgesics:  Tylenol , Excedrin, Tramadol  (effective but GI upset), Fioricet Past abortive triptans:  no Past muscle relaxants:  no Past anti-emetic:  Zofran  Past antihypertensive medications:  no Past antidepressant medications:  fluoxetine Past anticonvulsant medications: topiramate , gabapentin , pregablin Past CGRP inhibitor:  Emgality  (uncomfortable with injections), Qulipta  (weight loss) Past vitamins/Herbal/Supplements:  no Other past therapies:  no   Family history of headache:  Sister.   STROKE:  She had a stroke in May 2016 with left sided weakness.  MRI of brain from 10/06/14 was personally reviewed and revealed small acute lacunar infarcts in the right thalamus and right corona radiata, as well as chronic lacunar infarcts in the left hemisphere.  CTA of head and neck revealed diffuse bilateral petrous and cavernous carotid stenosis.  Cardiac source of  embolus was not discovered.     She was admitted to [facility] from 10/24/16 to 10/26/16 for stroke-like event.  She suddenly developed left sided weakness and slurred speech with associated headache.  Blood pressure in ED was 120/66.  CT of head was personally reviewed and negative for acute abnormality.  She had refused tPA.  MRI of brain was personally reviewed and revealed no acute stroke or bleed.  MRA of head revealed no significant intracranial stenosis or occlusion.  LDL was 84.  Hgb A1c was 5.7.  As per neurology evaluation, her deficits appeared to be non-organic, with unusual speech pattern and giveaway weakness.  Somatization was suspected.  2D echo from 11/06/16 demonstrated normal LV EF of 65-70% with no cardiac source of emboli.  Atorvastatin  was increased from 10mg  to 80mg  daily.  Differential includes TIA vs hemiplegic migraine vs somatization.   SYNCOPE: On 09/21/18, she presented to the ED at [facility] for a 5 day intractable holocephalic throbbing headache.  She reports that when EMS was leading her to the ambulance from her bedroom, she passed out for 20-30 seconds.  She described sensation of lightheadedness, like she was going to pass out, as well as tunnel vision and palpitations.  She was afebrile.  CT of head revealed her old left basal ganglia infarct but no acute abnormality.  She was treated with Reglan  and Benadryl  and headache reportedly resolved.  On 09/26/18, she was sitting on her barstool when she felt lightheaded again.  She noted palpitations as well.  She got up and started walking to her bedroom when she noted tunnel vision and passed out.  She woke up on the floor and bruised her back and leg.  She thinks she may have been unconsciousness for 20 to 30 minute.  There were no witnesses.  She noted urinary incontinence but did not bite her tongue.  She did not have a headache.  She denies change in medications over the past week.  She denies fever or illness.  Due to  episodes of recurrent syncopal events, she underwent workup.  MRI of brain with and without contrast on 10/11/18 was personally reviewed and demonstrated stable chronic infarcts but no acute intracranial abnormality.  EEG performed on 10/21/18 was normal. No recurrent spells.   FALLS: In 2019, she endorsed increased problems with balance and falls.  She stated that they were associated with headache and worsening of her left-sided weakness.  MRI of the brain without contrast from 11/13/2017 was personally reviewed and demonstrated no new intracranial abnormalities.  PAST MEDICAL HISTORY: Past Medical History:  Diagnosis Date   Anemia    Anxiety    Aortic regurgitation    Cardiomyopathy due to hypertension [name] 10/23/2014   Chest discomfort 10/12/2018   Chronic ischemic vertebrobasilar artery thalamic stroke    Depression    Essential hypertension 05/02/2015   Facet arthropathy 05/15/2021   Former smoker 01/24/2015   H/O noncompliance with medical treatment, presenting hazards to health    Headache 05/01/2015   Hemiparesis affecting left side as late effect of cerebrovascular accident [name] 07/11/2022   History of ischemic middle cerebral artery stroke embolic    Hyperlipidemia    Hypertension    Hypertensive heart disease    Lumbar radiculopathy 05/01/2021   Migraine 05/21/2018   Migraines    Mixed dyslipidemia 11/26/2020   Nonrheumatic aortic (valve) insufficiency    Obesity (BMI 35.0-39.9 without comorbidity) 11/26/2020   Osteoarthritis    Overweight (  BMI 25.0-29.9)    Palpitations 10/12/2018   SI joint arthritis [name] 05/15/2021   Sleep apnea    Snoring 05/02/2015   Stroke [name] 10/25/2016   Syncope and collapse 10/07/2018   Vitamin D  deficiency 05/15/2021    MEDICATIONS: Current Outpatient Medications on File Prior to Visit  Medication Sig Dispense Refill   amLODipine  (NORVASC ) 10 MG tablet Take 1 tablet (10 mg total) by mouth daily. 90 tablet 1   Atogepant  (QULIPTA )  60 MG TABS Take 1 tablet (60 mg total) by mouth daily. 30 tablet 11   atorvastatin  (LIPITOR ) 80 MG tablet TAKE 1 TABLET BY MOUTH EVERY DAY AT 6PM FOR CHOLESTEROL 90 tablet 1   busPIRone  (BUSPAR ) 10 MG tablet Take 1 tablet (10 mg total) by mouth 3 (three) times daily. 90 tablet 3   chlorhexidine  (HIBICLENS ) 4 % external liquid Apply topically daily as needed. 1000 mL 1   clopidogrel  (PLAVIX ) 75 MG tablet Take 1 tablet (75 mg total) by mouth daily. 90 tablet 3   flurbiprofen  (ANSAID ) 100 MG tablet TAKE 1 TABLET BY MOUTH EVERY 8 HOURS AS NEEDED (NOT MORE THAN 3 TABLETS A DAY) 30 tablet 5   hydrALAZINE  (APRESOLINE ) 50 MG tablet Take 1 tablet (50 mg total) by mouth 3 (three) times daily. 270 tablet 1   hydrochlorothiazide  (MICROZIDE ) 12.5 MG capsule TAKE 1 CAPSULE BY MOUTH DAILY 90 capsule 1   hydrOXYzine (ATARAX/VISTARIL) 50 MG tablet Take 50 mg by mouth 3 (three) times daily as needed for anxiety or sleep.     lisinopril  (ZESTRIL ) 10 MG tablet Take 1 tablet (10 mg total) by mouth daily. 90 tablet 1   metoCLOPramide  (REGLAN ) 10 MG tablet Take 10 mg by mouth every 6 (six) hours as needed for nausea or headache.     metoprolol  tartrate (LOPRESSOR ) 25 MG tablet Take 1 tablet (25 mg total) by mouth 2 (two) times daily. 180 tablet 1   Misc. Devices MISC Please provide patient with extra-large sized incontinence briefs N39.3 1 each 0   mupirocin  ointment (BACTROBAN ) 2 % Apply 1 Application topically 2 (two) times daily. Apply to back of head 200 g 3   nitroGLYCERIN  (NITROSTAT ) 0.4 MG SL tablet Place 1 tablet (0.4 mg total) under the tongue every 5 (five) minutes as needed for chest pain. 30 tablet 1   pantoprazole  (PROTONIX ) 40 MG tablet Take 1 tablet (40 mg total) by mouth daily before breakfast. 90 tablet 1   spironolactone  (ALDACTONE ) 25 MG tablet Take 1 tablet (25 mg total) by mouth daily. 90 tablet 1   traZODone  (DESYREL ) 100 MG tablet Take 1 tablet (100 mg total) by mouth at bedtime. 90 tablet 1    venlafaxine  XR (EFFEXOR -XR) 75 MG 24 hr capsule TAKE 3 CAPSULES BY MOUTH EVERY MORNING WITH BREAKFAST 90 capsule 1   No current facility-administered medications on file prior to visit.    ALLERGIES: Allergies  Allergen Reactions   Ibuprofen  Other (See Comments)    Can not take with current medications .   Tylenol  [Acetaminophen ] Other (See Comments)    Pt stated tylenol  gives her extreme headache    FAMILY HISTORY: Family History  Problem Relation Age of Onset   Cancer Mother        type unknown   Hypertension Mother    Hypertension Sister    Diabetes Sister    Cancer Maternal Aunt        4 aunts died of cancer types unknown      Objective:  Blood pressure 119/72, pulse (!) 57, height 5\' 6"  (1.676 m), weight 198 lb 3.2 oz (89.9 kg), last menstrual period 03/19/2016, SpO2 98%.  General: No acute distress.  Patient appears well-groomed.   Head:  Normocephalic/atraumatic Eyes:  Fundi examined but not visualized Neck: supple, no paraspinal tenderness, full range of motion Heart:  Regular rate and rhythm Neurological Exam:  alert and oriented.  Speech fluent and not dysarthric.  Language intact.  Decreased left facial sensation.  Otherwise, CN II-XII intact.  Muscle strength 5-/5 left deltoid and biceps, 4+/5 left triceps, hand grip and left lower extremity.  5/5 right upper and lower extremities.  Mildly reduced light touch sensation on the left.  Deep tendon reflexes 3+ throughout, slightly more brisk on the left.  Finger to nose testing with left ataxia.  Left hemiparetic gait.  Romberg with sway.  Ambulates with cane.    Janne Members, DO  CC: Winda Hastings, NP

## 2023-10-09 NOTE — Patient Instructions (Signed)
 If you should get a migraine, take ubrelvy at earliest onset.  May repeat after 2 hours.  Maximum 2 tablets in 24 hours.  Let me know if it works or not Limit use of pain relievers to no more than 9 days out of the month to prevent risk of rebound or medication-overuse headache. Follow up 6 months.

## 2023-10-09 NOTE — Progress Notes (Signed)
 Medication Samples have been provided to the patient.  Drug name: Florette Hurry       Strength: 100 mg        Qty: 4  LOT: [id number]  Exp.Date: 12/26  Dosing instructions: as needed  The patient has been instructed regarding the correct time, dose, and frequency of taking this medication, including desired effects and most common side effects.   Brandy Roberts 8:28 AM 10/09/2023

## 2023-10-20 ENCOUNTER — Ambulatory Visit

## 2023-10-22 ENCOUNTER — Ambulatory Visit: Payer: Self-pay

## 2023-10-22 NOTE — Telephone Encounter (Signed)
 Copied from CRM [id number]. Topic: Clinical - Red Word Triage >> Oct 22, 2023  2:00 PM Kevelyn M wrote: Red Word that prompted transfer to Nurse Triage: Patient keeps failing. Had an episode on Sunday. Patient hit her head, hand, and knee on left side.  Chief Complaint: falling episodes; this is the worse one she states. EMS came out states bp was high after the fall.  Declined to go to the ER; states she has a knot on the head.  Symptoms: fell see above; knee pain, lacs to knees,  Frequency: fell on Sunday Pertinent Negatives: Patient denies LOC Disposition: [x] ED /[] Urgent Care (no appt availability in office) / [] Appointment(In office/virtual)/ []  [facility]/ [] Home Care/ [x] Refused Recommended Disposition /[] [facility]/ []  Follow-up with PCP Additional Notes: instructed to go to there ER; states on blood thinners.  Wants to be seen in office; office to call patient back.   Reason for Disposition  Injury (or injuries) that need emergency care  Answer Assessment - Initial Assessment Questions 1. MECHANISM: "How did the fall happen?"     States one of the worst falls; states was at sisters house and was on the steps; states fell and hit the left side of her body, knees, hand, head 2. DOMESTIC VIOLENCE AND ELDER ABUSE SCREENING: "Did you fall because someone pushed you or tried to hurt you?" If Yes, ask: "Are you safe now?"     no 3. ONSET: "When did the fall happen?" (e.g., minutes, hours, or days ago)     Sunday 4. LOCATION: "What part of the body hit the ground?" (e.g., back, buttocks, head, hips, knees, hands, head, stomach)     Left side of body; states knee pain 5. INJURY: "Did you hurt (injure) yourself when you fell?" If Yes, ask: "What did you injure? Tell me more about this?" (e.g., body area; type of injury; pain severity)"     See above 6. PAIN: "Is there any pain?" If Yes, ask: "How bad is the pain?" (e.g., Scale 1-10; or mild,  moderate, severe)   -  NONE (0): No pain   - MILD (1-3): Doesn't interfere with normal activities    - MODERATE (4-7): Interferes with normal activities or awakens from sleep    - SEVERE (8-10): Excruciating pain, unable to do any normal activities      mild 7. SIZE: For cuts, bruises, or swelling, ask: "How large is it?" (e.g., inches or centimeters)      Bleeding from the knee; size of a quarter 8. PREGNANCY: "Is there any chance you are pregnant?" "When was your last menstrual period?"     na 9. OTHER SYMPTOMS: "Do you have any other symptoms?" (e.g., dizziness, fever, weakness; new onset or worsening).      weakness 10. CAUSE: "What do you think caused the fall (or falling)?" (e.g., tripped, dizzy spell)       Unknown.  Protocols used: [facility]

## 2023-10-22 NOTE — Telephone Encounter (Signed)
Thank you. Agree with advice given.

## 2023-10-22 NOTE — Telephone Encounter (Signed)
 Spoke with patient. Patient voiced that she had a very bad fall on Sunday. She  is very sore, has bruises,  and knot on her head and had a wound that looks like it healing but very red and is warm touch.  Advised patient ,she does not need an OV at this time but she needs to go to the ED to be assessed and may need to get some xrays. Advised that this office does not have xray capabilities. Advised that since she is on Blood thinners ,anytime she has a bad fall like the one she has described; going to the ED to be examined is always the best option. Patient is in agreement with advisement and will go to the ED.

## 2023-10-27 ENCOUNTER — Ambulatory Visit: Payer: 59 | Admitting: Neurology

## 2023-10-31 DIAGNOSIS — R112 Nausea with vomiting, unspecified: Secondary | ICD-10-CM | POA: Diagnosis not present

## 2023-10-31 DIAGNOSIS — R1084 Generalized abdominal pain: Secondary | ICD-10-CM | POA: Diagnosis not present

## 2023-10-31 DIAGNOSIS — R197 Diarrhea, unspecified: Secondary | ICD-10-CM | POA: Diagnosis not present

## 2023-10-31 DIAGNOSIS — I10 Essential (primary) hypertension: Secondary | ICD-10-CM | POA: Diagnosis not present

## 2023-10-31 DIAGNOSIS — R1111 Vomiting without nausea: Secondary | ICD-10-CM | POA: Diagnosis not present

## 2023-10-31 DIAGNOSIS — R109 Unspecified abdominal pain: Secondary | ICD-10-CM | POA: Diagnosis not present

## 2023-10-31 DIAGNOSIS — R7989 Other specified abnormal findings of blood chemistry: Secondary | ICD-10-CM | POA: Diagnosis not present

## 2023-11-04 ENCOUNTER — Ambulatory Visit
Admission: RE | Admit: 2023-11-04 | Discharge: 2023-11-04 | Disposition: A | Discharge status: Home / Self Care | Source: Ambulatory Visit | Attending: Nurse Practitioner | Admitting: Nurse Practitioner

## 2023-11-04 DIAGNOSIS — Z1231 Encounter for screening mammogram for malignant neoplasm of breast: Secondary | ICD-10-CM | POA: Diagnosis not present

## 2023-11-10 ENCOUNTER — Ambulatory Visit: Payer: Self-pay | Admitting: Nurse Practitioner

## 2023-11-12 NOTE — Telephone Encounter (Signed)
 Pt returning your call

## 2023-12-02 ENCOUNTER — Other Ambulatory Visit: Payer: Self-pay | Admitting: Nurse Practitioner

## 2023-12-02 ENCOUNTER — Other Ambulatory Visit: Payer: Self-pay | Admitting: Family Medicine

## 2023-12-02 DIAGNOSIS — I10 Essential (primary) hypertension: Secondary | ICD-10-CM

## 2023-12-02 DIAGNOSIS — F418 Other specified anxiety disorders: Secondary | ICD-10-CM

## 2023-12-22 ENCOUNTER — Other Ambulatory Visit: Payer: Self-pay | Admitting: Family Medicine

## 2023-12-22 ENCOUNTER — Other Ambulatory Visit: Payer: Self-pay | Admitting: Nurse Practitioner

## 2023-12-22 DIAGNOSIS — I10 Essential (primary) hypertension: Secondary | ICD-10-CM

## 2023-12-22 DIAGNOSIS — K2100 Gastro-esophageal reflux disease with esophagitis, without bleeding: Secondary | ICD-10-CM

## 2023-12-29 ENCOUNTER — Telehealth: Payer: Self-pay | Admitting: Nurse Practitioner

## 2023-12-29 NOTE — Telephone Encounter (Signed)
 Called patient, no answer. Left voicemail confirming upcoming appointment on 12/30/2023 at 8:30 am. Provided callback number for any questions or changes.

## 2023-12-30 ENCOUNTER — Ambulatory Visit: Attending: Nurse Practitioner | Admitting: Nurse Practitioner

## 2023-12-30 ENCOUNTER — Encounter: Payer: Self-pay | Admitting: Nurse Practitioner

## 2023-12-30 VITALS — BP 108/62 | HR 60 | Resp 19 | Ht 66.0 in | Wt 192.8 lb

## 2023-12-30 DIAGNOSIS — D72829 Elevated white blood cell count, unspecified: Secondary | ICD-10-CM

## 2023-12-30 DIAGNOSIS — F17210 Nicotine dependence, cigarettes, uncomplicated: Secondary | ICD-10-CM | POA: Diagnosis not present

## 2023-12-30 DIAGNOSIS — E7800 Pure hypercholesterolemia, unspecified: Secondary | ICD-10-CM | POA: Diagnosis not present

## 2023-12-30 DIAGNOSIS — E1165 Type 2 diabetes mellitus with hyperglycemia: Secondary | ICD-10-CM | POA: Diagnosis not present

## 2023-12-30 DIAGNOSIS — R7989 Other specified abnormal findings of blood chemistry: Secondary | ICD-10-CM

## 2023-12-30 DIAGNOSIS — I10 Essential (primary) hypertension: Secondary | ICD-10-CM | POA: Diagnosis not present

## 2023-12-30 DIAGNOSIS — F17200 Nicotine dependence, unspecified, uncomplicated: Secondary | ICD-10-CM

## 2023-12-30 MED ORDER — NICOTINE 21 MG/24HR TD PT24: 21.0000 mg | MEDICATED_PATCH | Freq: Every day | TRANSDERMAL | 0 refills | Status: AC

## 2023-12-30 MED ORDER — METOPROLOL TARTRATE 25 MG PO TABS: 25.0000 mg | ORAL_TABLET | Freq: Two times a day (BID) | ORAL | 0 refills | Status: DC

## 2023-12-30 MED ORDER — METOPROLOL TARTRATE 25 MG PO TABS: 25.0000 mg | ORAL_TABLET | Freq: Two times a day (BID) | ORAL | 1 refills | Status: AC

## 2023-12-30 NOTE — Progress Notes (Signed)
 Assessment & Plan:  Brandy Roberts was seen today for hypertension.  Diagnoses and all orders for this visit:  Primary hypertension -     metoprolol  tartrate (LOPRESSOR ) 25 MG tablet; Take 1 tablet (25 mg total) by mouth 2 (two) times daily. Continue all antihypertensives as prescribed.  Reminded to bring in blood pressure log for follow  up appointment.  RECOMMENDATIONS: DASH/Mediterranean Diets are healthier choices for HTN.    Type 2 diabetes mellitus with hyperglycemia, without long-term current use of insulin [name] -     Urine Albumin/Creatinine with ratio (send out) [LAB689] -     CMP14+EGFR -     Hemoglobin A1c  Abnormal TSH -     Thyroid  Panel With TSH  Leukocytosis, unspecified type -     CBC with Differential/Platelet  Pure hypercholesterolemia -     Lipid panel  Tobacco dependence Desires to quit smoking. -     nicotine  (NICODERM CQ  - DOSED IN MG/24 HOURS) 21 mg/24hr patch; Place 1 patch (21 mg total) onto the skin daily.   Recurrent falls Falls likely due to inappropriate footwear and potential balance issues. Current footwear lacks support. - Advise wearing supportive sneakers, such as New Balance or Hoka. - If falls persist, consider referral to physical therapy.    General Health Maintenance Mammogram up to date. Colon cancer screening options discussed. Cologuard covered by insurance. - Encourage colon cancer screening, preferably with colonoscopy or Cologuard.  She declines both as well return her office FIT test    Patient has been counseled on age-appropriate routine health concerns for screening and prevention. These are reviewed and up-to-date. Referrals have been placed accordingly. Immunizations are up-to-date or declined.    Subjective:   Chief Complaint  Patient presents with   Hypertension    History of Present Illness Brandy Roberts is a 53 year old female who presents  for follow up to HTN and with concerns of frequent falls and difficulty  with stability while walking.   She has a past medical history of Anemia, Anxiety, Aortic regurgitation, Cardiomyopathy due to hypertension (10/23/2014), Chronic ischemic vertebrobasilar artery thalamic stroke, Depression, Essential hypertension (05/02/2015), Former smoker (01/24/2015), H/O noncompliance with medical treatment, presenting hazards to health, Headache (05/01/2015), History of ischemic middle cerebral artery stroke embolic, Hyperlipidemia, Hypertension, Hypertensive heart disease, Migraine (05/21/2018), Migraines, Nonrheumatic aortic (valve) insufficiency, Osteoarthritis, Overweight (BMI 25.0-29.9), Palpitations (10/12/2018), Sleep apnea, Snoring (05/02/2015), Stroke (10/25/2016), and Syncope and collapse (10/07/2018).     HTN Pressure is well-controlled with amlodipine  10 mg daily, hydralazine  50 mg 3 times daily, hydrochlorothiazide  12.5 mg daily lisinopril  10 mg daily Lopressor  25 mg twice daily and spironolactone  25 mg daily. BP Readings from Last 3 Encounters:  12/30/23 108/62  10/09/23 119/72  09/30/23 111/68    She experiences frequent falls, particularly when attempting to walk up steps. During a recent incident, she hit the bottom of a step with her foot, resulting in injuries to her foot, head, knee, hand, and shoulder. These falls have been occurring quite often, causing concern. She has on slides today which she reports wearing daily. Her son has removed all rugs from her house to prevent tripping hazards.   She wants to quit smoking and requests nicotine  patches to aid in smoking cessation. She smokes approximately ten cigarettes a day.  She has a stool kit at home for colorectal cancer screening but has not yet returned it.   Review of Systems  Constitutional:  Negative for fever, malaise/fatigue and weight loss.  HENT:  Negative.  Negative for nosebleeds.   Eyes: Negative.  Negative for blurred vision, double vision and photophobia.  Respiratory: Negative.  Negative for  cough and shortness of breath.   Cardiovascular: Negative.  Negative for chest pain, palpitations and leg swelling.  Gastrointestinal: Negative.  Negative for heartburn, nausea and vomiting.  Musculoskeletal: Negative.  Negative for myalgias.  Neurological:  Negative for dizziness, focal weakness, seizures and headaches.       FALLS  Psychiatric/Behavioral:  Positive for depression. Negative for suicidal ideas.     Past Medical History:  Diagnosis Date   Anemia    Anxiety    Aortic regurgitation    Cardiomyopathy due to hypertension [name] 10/23/2014   Chest discomfort 10/12/2018   Chronic ischemic vertebrobasilar artery thalamic stroke    Depression    Essential hypertension 05/02/2015   Facet arthropathy 05/15/2021   Former smoker 01/24/2015   H/O noncompliance with medical treatment, presenting hazards to health    Headache 05/01/2015   Hemiparesis affecting left side as late effect of cerebrovascular accident [name] 07/11/2022   History of ischemic middle cerebral artery stroke embolic    Hyperlipidemia    Hypertension    Hypertensive heart disease    Lumbar radiculopathy 05/01/2021   Migraine 05/21/2018   Migraines    Mixed dyslipidemia 11/26/2020   Nonrheumatic aortic (valve) insufficiency    Obesity (BMI 35.0-39.9 without comorbidity) 11/26/2020   Osteoarthritis    Overweight (BMI 25.0-29.9)    Palpitations 10/12/2018   SI joint arthritis [name] 05/15/2021   Sleep apnea    Snoring 05/02/2015   Stroke [name] 10/25/2016   Syncope and collapse 10/07/2018   Vitamin D  deficiency 05/15/2021    Past Surgical History:  Procedure Laterality Date   CESAREAN SECTION     GANGLION CYST EXCISION     TEE WITHOUT CARDIOVERSION N/A 10/09/2014   Procedure: TRANSESOPHAGEAL ECHOCARDIOGRAM (TEE);  Surgeon: Jerel Balding, MD;  Location[facility] ENDOSCOPY;  Service: Cardiovascular;  Laterality: N/A;   TONSILLECTOMY      Family History  Problem Relation Age of Onset   Cancer Mother         type unknown   Hypertension Mother    Hypertension Sister    Diabetes Sister    Cancer Maternal Aunt        4 aunts died of cancer types unknown    Social History Reviewed with no changes to be made today.   Outpatient Medications Prior to Visit  Medication Sig Dispense Refill   amLODipine  (NORVASC ) 10 MG tablet Take 1 tablet (10 mg total) by mouth daily. 90 tablet 1   Atogepant  (QULIPTA ) 60 MG TABS Take 1 tablet (60 mg total) by mouth daily. 30 tablet 11   atorvastatin  (LIPITOR ) 80 MG tablet TAKE 1 TABLET BY MOUTH EVERY DAY AT 6PM FOR CHOLESTEROL 90 tablet 1   busPIRone  (BUSPAR ) 10 MG tablet Take 1 tablet (10 mg total) by mouth 3 (three) times daily. 90 tablet 3   clopidogrel  (PLAVIX ) 75 MG tablet Take 1 tablet (75 mg total) by mouth daily. 90 tablet 3   hydrALAZINE  (APRESOLINE ) 50 MG tablet Take 1 tablet (50 mg total) by mouth 3 (three) times daily. 270 tablet 1   hydrochlorothiazide  (MICROZIDE ) 12.5 MG capsule TAKE 1 CAPSULE BY MOUTH DAILY 90 capsule 1   hydrOXYzine (ATARAX/VISTARIL) 50 MG tablet Take 50 mg by mouth 3 (three) times daily as needed for anxiety or sleep.     lisinopril  (ZESTRIL ) 10 MG tablet TAKE 1 TABLET BY  MOUTH EVERY DAY 90 tablet 1   metoCLOPramide  (REGLAN ) 10 MG tablet Take 10 mg by mouth every 6 (six) hours as needed for nausea or headache.     Misc. Devices MISC Please provide patient with extra-large sized incontinence briefs N39.3 1 each 0   mupirocin  ointment (BACTROBAN ) 2 % Apply 1 Application topically 2 (two) times daily. Apply to back of head 200 g 3   nitroGLYCERIN  (NITROSTAT ) 0.4 MG SL tablet Place 1 tablet (0.4 mg total) under the tongue every 5 (five) minutes as needed for chest pain. 30 tablet 1   pantoprazole  (PROTONIX ) 40 MG tablet TAKE 1 TABLET BY MOUTH EACH DAY BEFORE BREAKFAST FOR ACID REFLUX 90 tablet 1   spironolactone  (ALDACTONE ) 25 MG tablet Take 1 tablet (25 mg total) by mouth daily. 90 tablet 1   traZODone  (DESYREL ) 100 MG tablet Take 1  tablet (100 mg total) by mouth at bedtime. 90 tablet 1   venlafaxine  XR (EFFEXOR -XR) 75 MG 24 hr capsule TAKE 3 CAPSULES BY MOUTH EVERY MORNING WITH BREAKFAST 90 capsule 1   metoprolol  tartrate (LOPRESSOR ) 25 MG tablet TAKE 1 TABLET BY MOUTH TWICE DAILY 180 tablet 0   chlorhexidine  (HIBICLENS ) 4 % external liquid Apply topically daily as needed. (Patient not taking: Reported on 12/30/2023) 1000 mL 1   flurbiprofen  (ANSAID ) 100 MG tablet TAKE 1 TABLET BY MOUTH EVERY 8 HOURS AS NEEDED (NOT MORE THAN 3 TABLETS A DAY) (Patient not taking: Reported on 12/30/2023) 30 tablet 5   No facility-administered medications prior to visit.    Allergies  Allergen Reactions   Ibuprofen  Other (See Comments)    Can not take with current medications .   Tylenol  [Acetaminophen ] Other (See Comments)    Pt stated tylenol  gives her extreme headache       Objective:    BP 108/62 (BP Location: Right Arm, Patient Position: Sitting, Cuff Size: Normal)   Pulse 60   Resp 19   Ht 5' 6 (1.676 m)   Wt 192 lb 12.8 oz (87.5 kg)   LMP 03/19/2016   SpO2 100%   BMI 31.12 kg/m  Wt Readings from Last 3 Encounters:  12/30/23 192 lb 12.8 oz (87.5 kg)  10/09/23 198 lb 3.2 oz (89.9 kg)  09/30/23 195 lb 9.6 oz (88.7 kg)    Physical Exam Vitals and nursing note reviewed.  Constitutional:      Appearance: She is well-developed.  HENT:     Head: Normocephalic and atraumatic.  Cardiovascular:     Rate and Rhythm: Normal rate and regular rhythm.     Heart sounds: Normal heart sounds. No murmur heard.    No friction rub. No gallop.  Pulmonary:     Effort: Pulmonary effort is normal. No tachypnea or respiratory distress.     Breath sounds: Normal breath sounds. No decreased breath sounds, wheezing, rhonchi or rales.  Chest:     Chest wall: No tenderness.  Abdominal:     General: Bowel sounds are normal.     Palpations: Abdomen is soft.  Musculoskeletal:        General: Normal range of motion.     Cervical back:  Normal range of motion.  Skin:    General: Skin is warm and dry.  Neurological:     Mental Status: She is alert and oriented to person, place, and time.     Coordination: Coordination normal.  Psychiatric:        Behavior: Behavior normal. Behavior is cooperative.  Thought Content: Thought content normal.        Judgment: Judgment normal.          Patient has been counseled extensively about nutrition and exercise as well as the importance of adherence with medications and regular follow-up. The patient was given clear instructions to go to ER or return to medical center if symptoms don't improve, worsen or new problems develop. The patient verbalized understanding.   Follow-up: Return in about 3 months (around 03/31/2024).   Haze LELON Servant, FNP-BC [facility] and [facility] [place], [place] [phone]   12/30/2023, 9:03 AM

## 2023-12-31 LAB — LIPID PANEL

## 2024-01-02 LAB — CBC WITH DIFFERENTIAL/PLATELET
Basophils Absolute: 0 x10E3/uL (ref 0.0–0.2)
Basos: 1 %
EOS (ABSOLUTE): 0.4 x10E3/uL (ref 0.0–0.4)
Eos: 6 %
Hematocrit: 41.3 % (ref 34.0–46.6)
Hemoglobin: 12.8 g/dL (ref 11.1–15.9)
Immature Grans (Abs): 0 x10E3/uL (ref 0.0–0.1)
Immature Granulocytes: 0 %
Lymphocytes Absolute: 2.2 x10E3/uL (ref 0.7–3.1)
Lymphs: 32 %
MCH: 27.8 pg (ref 26.6–33.0)
MCHC: 31 g/dL — ABNORMAL LOW (ref 31.5–35.7)
MCV: 90 fL (ref 79–97)
Monocytes Absolute: 0.6 x10E3/uL (ref 0.1–0.9)
Monocytes: 9 %
Neutrophils Absolute: 3.5 x10E3/uL (ref 1.4–7.0)
Neutrophils: 52 %
Platelets: 260 x10E3/uL (ref 150–450)
RBC: 4.6 x10E6/uL (ref 3.77–5.28)
RDW: 13.6 % (ref 11.7–15.4)
WBC: 6.7 x10E3/uL (ref 3.4–10.8)

## 2024-01-02 LAB — HEMOGLOBIN A1C
Est. average glucose Bld gHb Est-mCnc: 137 mg/dL
Hgb A1c MFr Bld: 6.4 % — ABNORMAL HIGH (ref 4.8–5.6)

## 2024-01-02 LAB — CMP14+EGFR
ALT: 18 IU/L (ref 0–32)
AST: 19 IU/L (ref 0–40)
Albumin: 4.2 g/dL (ref 3.8–4.9)
Alkaline Phosphatase: 138 IU/L — ABNORMAL HIGH (ref 44–121)
BUN/Creatinine Ratio: 14 (ref 9–23)
BUN: 15 mg/dL (ref 6–24)
Bilirubin Total: <0.2 mg/dL (ref 0.0–1.2)
CO2: 21 mmol/L (ref 20–29)
Calcium: 9.3 mg/dL (ref 8.7–10.2)
Chloride: 102 mmol/L (ref 96–106)
Creatinine, Ser: 1.06 mg/dL — ABNORMAL HIGH (ref 0.57–1.00)
Globulin, Total: 3.2 g/dL (ref 1.5–4.5)
Glucose: 123 mg/dL — ABNORMAL HIGH (ref 70–99)
Potassium: 4 mmol/L (ref 3.5–5.2)
Sodium: 141 mmol/L (ref 134–144)
Total Protein: 7.4 g/dL (ref 6.0–8.5)
eGFR: 62 mL/min/1.73 (ref >59)

## 2024-01-02 LAB — MICROALBUMIN / CREATININE URINE RATIO

## 2024-01-02 LAB — LIPID PANEL
Chol/HDL Ratio: 4.5 ratio — ABNORMAL HIGH (ref 0.0–4.4)
Cholesterol, Total: 167 mg/dL (ref 100–199)
HDL: 37 mg/dL — ABNORMAL LOW (ref >39)
LDL Chol Calc (NIH): 106 mg/dL — ABNORMAL HIGH (ref 0–99)
Triglycerides: 133 mg/dL (ref 0–149)
VLDL Cholesterol Cal: 24 mg/dL (ref 5–40)

## 2024-01-02 LAB — THYROID PANEL WITH TSH
Free Thyroxine Index: 1.9 (ref 1.2–4.9)
T3 Uptake Ratio: 27 % (ref 24–39)
T4, Total: 7.1 ug/dL (ref 4.5–12.0)
TSH: 2.45 u[IU]/mL (ref 0.450–4.500)

## 2024-01-02 LAB — SPECIMEN STATUS REPORT

## 2024-01-09 ENCOUNTER — Ambulatory Visit: Payer: Self-pay | Admitting: Nurse Practitioner

## 2024-01-21 ENCOUNTER — Encounter (HOSPITAL_COMMUNITY): Payer: Self-pay | Admitting: Emergency Medicine

## 2024-01-21 ENCOUNTER — Other Ambulatory Visit: Payer: Self-pay

## 2024-01-21 ENCOUNTER — Encounter (HOSPITAL_COMMUNITY): Payer: Self-pay

## 2024-01-21 ENCOUNTER — Emergency Department (HOSPITAL_COMMUNITY)
Admission: EM | Admit: 2024-01-21 | Discharge: 2024-01-21 | Discharge status: Against Medical Advice | Attending: Emergency Medicine | Admitting: Emergency Medicine

## 2024-01-21 ENCOUNTER — Ambulatory Visit (HOSPITAL_COMMUNITY)
Admission: EM | Admit: 2024-01-21 | Discharge: 2024-01-21 | Disposition: A | Discharge status: Home / Self Care | Attending: Internal Medicine | Admitting: Internal Medicine

## 2024-01-21 DIAGNOSIS — R112 Nausea with vomiting, unspecified: Secondary | ICD-10-CM | POA: Diagnosis not present

## 2024-01-21 DIAGNOSIS — A059 Bacterial foodborne intoxication, unspecified: Secondary | ICD-10-CM

## 2024-01-21 DIAGNOSIS — Z5321 Procedure and treatment not carried out due to patient leaving prior to being seen by health care provider: Secondary | ICD-10-CM | POA: Diagnosis not present

## 2024-01-21 LAB — COMPREHENSIVE METABOLIC PANEL WITH GFR
ALT: 22 U/L (ref 0–44)
AST: 26 U/L (ref 15–41)
Albumin: 3.9 g/dL (ref 3.5–5.0)
Alkaline Phosphatase: 117 U/L (ref 38–126)
Anion gap: 12 (ref 5–15)
BUN: 13 mg/dL (ref 6–20)
CO2: 27 mmol/L (ref 22–32)
Calcium: 10.1 mg/dL (ref 8.9–10.3)
Chloride: 98 mmol/L (ref 98–111)
Creatinine, Ser: 1.15 mg/dL — ABNORMAL HIGH (ref 0.44–1.00)
GFR, Estimated: 57 mL/min — ABNORMAL LOW (ref >60)
Glucose, Bld: 147 mg/dL — ABNORMAL HIGH (ref 70–99)
Potassium: 5.1 mmol/L (ref 3.5–5.1)
Sodium: 137 mmol/L (ref 135–145)
Total Bilirubin: 1.3 mg/dL — ABNORMAL HIGH (ref 0.0–1.2)
Total Protein: 8.4 g/dL — ABNORMAL HIGH (ref 6.5–8.1)

## 2024-01-21 LAB — CBC
HCT: 42.9 % (ref 36.0–46.0)
Hemoglobin: 14 g/dL (ref 12.0–15.0)
MCH: 28 pg (ref 26.0–34.0)
MCHC: 32.6 g/dL (ref 30.0–36.0)
MCV: 85.8 fL (ref 80.0–100.0)
Platelets: 321 K/uL (ref 150–400)
RBC: 5 MIL/uL (ref 3.87–5.11)
RDW: 15.1 % (ref 11.5–15.5)
WBC: 6.8 K/uL (ref 4.0–10.5)
nRBC: 0 % (ref 0.0–0.2)

## 2024-01-21 LAB — LIPASE, BLOOD: Lipase: 27 U/L (ref 11–51)

## 2024-01-21 MED ORDER — LIDOCAINE VISCOUS HCL 2 % MT SOLN
15.0000 mL | Freq: Once | OROMUCOSAL | Status: AC
  Administered 2024-01-21: 15 mL via OROMUCOSAL

## 2024-01-21 MED ORDER — LIDOCAINE VISCOUS HCL 2 % MT SOLN
OROMUCOSAL | Status: AC
  Filled 2024-01-21: qty 15

## 2024-01-21 MED ORDER — ONDANSETRON 4 MG PO TBDP
4.0000 mg | ORAL_TABLET | Freq: Three times a day (TID) | ORAL | 0 refills | Status: DC | PRN
Start: 2024-01-21 — End: 2024-03-29

## 2024-01-21 MED ORDER — ONDANSETRON 4 MG PO TBDP: 4.0000 mg | ORAL_TABLET | Freq: Once | ORAL | Status: DC

## 2024-01-21 MED ORDER — ALUM & MAG HYDROXIDE-SIMETH 200-200-20 MG/5ML PO SUSP
30.0000 mL | Freq: Once | ORAL | Status: AC
  Administered 2024-01-21: 30 mL via ORAL

## 2024-01-21 MED ORDER — ALUM & MAG HYDROXIDE-SIMETH 200-200-20 MG/5ML PO SUSP
ORAL | Status: AC
  Filled 2024-01-21: qty 30

## 2024-01-21 MED ORDER — ONDANSETRON 4 MG PO TBDP
4.0000 mg | ORAL_TABLET | Freq: Once | ORAL | Status: AC | PRN
  Administered 2024-01-21: 4 mg via ORAL
  Filled 2024-01-21: qty 1

## 2024-01-21 NOTE — ED Triage Notes (Signed)
 Patient presenting with abdominal pain,. States she ate at [employer] and has been vomiting since. Onset yesterday afternoon. Having emesis, heart burn, and chills.

## 2024-01-21 NOTE — Discharge Instructions (Signed)

## 2024-01-21 NOTE — ED Provider Notes (Signed)
 [facility]    CSN: [id number] Arrival date & time: 01/21/24  1856      History   Chief Complaint Chief Complaint  Patient presents with   Abdominal Pain    HPI Brandy Roberts is a 53 y.o. female.   Patient presents to urgent care with family member who contributes to the history for evaluation of nausea and vomiting after eating [employer] chicken.  She believes that the chicken may have been old or undercooked.  Symptoms began 1 to 2 hours after eating [employer] chicken.  She has had multiple episodes of nonbloody/nonbilious emesis prior to arrival to urgent care.  She was given Zofran  prior to arrival and this helped significantly with nausea and vomiting.  She now complains of epigastric pain, belching, and acid reflux after vomiting. Denies dizziness, fever, chills, diarrhea, abdominal pain, and recent sick contacts with similar symptoms.  Denies chest pain and shortness of breath.   Abdominal Pain   Past Medical History:  Diagnosis Date   Anemia    Anxiety    Aortic regurgitation    Cardiomyopathy due to hypertension [name] 10/23/2014   Chest discomfort 10/12/2018   Chronic ischemic vertebrobasilar artery thalamic stroke    Depression    Essential hypertension 05/02/2015   Facet arthropathy 05/15/2021   Former smoker 01/24/2015   H/O noncompliance with medical treatment, presenting hazards to health    Headache 05/01/2015   Hemiparesis affecting left side as late effect of cerebrovascular accident [name] 07/11/2022   History of ischemic middle cerebral artery stroke embolic    Hyperlipidemia    Hypertension    Hypertensive heart disease    Lumbar radiculopathy 05/01/2021   Migraine 05/21/2018   Migraines    Mixed dyslipidemia 11/26/2020   Nonrheumatic aortic (valve) insufficiency    Obesity (BMI 35.0-39.9 without comorbidity) 11/26/2020   Osteoarthritis    Overweight (BMI 25.0-29.9)    Palpitations 10/12/2018   SI joint arthritis [name] 05/15/2021    Sleep apnea    Snoring 05/02/2015   Stroke [name] 10/25/2016   Syncope and collapse 10/07/2018   Vitamin D  deficiency 05/15/2021    Patient Active Problem List   Diagnosis Date Noted   Cigarette smoker 02/18/2023   Hemiparesis affecting left side as late effect of cerebrovascular accident [name] 07/11/2022   Vitamin D  deficiency 05/15/2021   Facet arthropathy 05/15/2021   SI joint arthritis [name] 05/15/2021   Lumbar radiculopathy 05/01/2021   Obesity (BMI 35.0-39.9 without comorbidity) 11/26/2020   Mixed dyslipidemia 11/26/2020   Anemia    Anxiety    Depression    Hypertension    Migraines    Nonrheumatic aortic (valve) insufficiency    Osteoarthritis    Sleep apnea    Palpitations 10/12/2018   Chest discomfort 10/12/2018   Syncope and collapse 10/07/2018   Migraine 05/21/2018   Stroke [name] 10/25/2016   Snoring 05/02/2015   Essential hypertension 05/02/2015   Headache 05/01/2015   Former smoker 01/24/2015   Cardiomyopathy due to hypertension [name] 10/23/2014   Chronic ischemic vertebrobasilar artery thalamic stroke    Hypertensive heart disease    Overweight (BMI 25.0-29.9)    H/O noncompliance with medical treatment, presenting hazards to health    Aortic regurgitation    History of ischemic middle cerebral artery stroke embolic    Hyperlipidemia     Past Surgical History:  Procedure Laterality Date   CESAREAN SECTION     GANGLION CYST EXCISION     TEE WITHOUT CARDIOVERSION N/A 10/09/2014  Procedure: TRANSESOPHAGEAL ECHOCARDIOGRAM (TEE);  Surgeon: Jerel Balding, MD;  Location: [facility] ENDOSCOPY;  Service: Cardiovascular;  Laterality: N/A;   TONSILLECTOMY      OB History   No obstetric history on file.      Home Medications    Prior to Admission medications   Medication Sig Start Date End Date Taking? Authorizing Provider  ondansetron  (ZOFRAN -ODT) 4 MG disintegrating tablet Take 1 tablet (4 mg total) by mouth every 8 (eight) hours as needed for nausea or  vomiting. 01/21/24  Yes Enedelia Dorna HERO, FNP  amLODipine  (NORVASC ) 10 MG tablet Take 1 tablet (10 mg total) by mouth daily. 09/30/23   Fleming, Zelda W, NP  Atogepant  (QULIPTA ) 60 MG TABS Take 1 tablet (60 mg total) by mouth daily. 10/31/22   Skeet, Adam R, DO  atorvastatin  (LIPITOR ) 80 MG tablet TAKE 1 TABLET BY MOUTH EVERY DAY AT 6PM FOR CHOLESTEROL 03/10/23   Fleming, Zelda W, NP  busPIRone  (BUSPAR ) 10 MG tablet Take 1 tablet (10 mg total) by mouth 3 (three) times daily. 01/28/21   Fleming, Zelda W, NP  chlorhexidine  (HIBICLENS ) 4 % external liquid Apply topically daily as needed. Patient not taking: Reported on 12/30/2023 03/10/23   Fleming, Zelda W, NP  clopidogrel  (PLAVIX ) 75 MG tablet Take 1 tablet (75 mg total) by mouth daily. 09/30/23   Fleming, Zelda W, NP  flurbiprofen  (ANSAID ) 100 MG tablet TAKE 1 TABLET BY MOUTH EVERY 8 HOURS AS NEEDED (NOT MORE THAN 3 TABLETS A DAY) Patient not taking: Reported on 12/30/2023 09/30/23   Skeet Juliene SAUNDERS, DO  hydrALAZINE  (APRESOLINE ) 50 MG tablet Take 1 tablet (50 mg total) by mouth 3 (three) times daily. 09/30/23   Fleming, Zelda W, NP  hydrochlorothiazide  (MICROZIDE ) 12.5 MG capsule TAKE 1 CAPSULE BY MOUTH DAILY 12/22/23   Fleming, Zelda W, NP  hydrOXYzine (ATARAX/VISTARIL) 50 MG tablet Take 50 mg by mouth 3 (three) times daily as needed for anxiety or sleep.    [provider]  lisinopril  (ZESTRIL ) 10 MG tablet TAKE 1 TABLET BY MOUTH EVERY DAY 12/22/23   Fleming, Zelda W, NP  metoCLOPramide  (REGLAN ) 10 MG tablet Take 10 mg by mouth every 6 (six) hours as needed for nausea or headache.    [provider]  metoprolol  tartrate (LOPRESSOR ) 25 MG tablet Take 1 tablet (25 mg total) by mouth 2 (two) times daily. 12/30/23   Theotis Haze ORN, NP  Misc. Devices MISC Please provide patient with extra-large sized incontinence briefs N39.3 08/18/23   Fleming, Zelda W, NP  mupirocin  ointment (BACTROBAN ) 2 % Apply 1 Application topically 2 (two) times daily.  Apply to back of head 07/08/23   Theotis Haze ORN, NP  nicotine  (NICODERM CQ  - DOSED IN MG/24 HOURS) 21 mg/24hr patch Place 1 patch (21 mg total) onto the skin daily. 12/30/23 02/10/24  Fleming, Zelda W, NP  nitroGLYCERIN  (NITROSTAT ) 0.4 MG SL tablet Place 1 tablet (0.4 mg total) under the tongue every 5 (five) minutes as needed for chest pain. 09/30/23   Fleming, Zelda W, NP  pantoprazole  (PROTONIX ) 40 MG tablet TAKE 1 TABLET BY MOUTH EACH DAY BEFORE BREAKFAST FOR ACID REFLUX 12/22/23   Fleming, Zelda W, NP  spironolactone  (ALDACTONE ) 25 MG tablet Take 1 tablet (25 mg total) by mouth daily. 09/30/23   Fleming, Zelda W, NP  traZODone  (DESYREL ) 100 MG tablet Take 1 tablet (100 mg total) by mouth at bedtime. 09/30/23   Fleming, Zelda W, NP  venlafaxine  XR (EFFEXOR -XR) 75 MG 24 hr  capsule TAKE 3 CAPSULES BY MOUTH EVERY MORNING WITH BREAKFAST 12/02/23   Theotis Haze ORN, NP    Family History Family History  Problem Relation Age of Onset   Cancer Mother        type unknown   Hypertension Mother    Hypertension Sister    Diabetes Sister    Cancer Maternal Aunt        4 aunts died of cancer types unknown    Social History Social History   Tobacco Use   Smoking status: Former    Current packs/day: 0.00    Types: Cigarettes    Quit date: 08/31/2017    Years since quitting: 6.3   Smokeless tobacco: Never  Vaping Use   Vaping status: Never Used  Substance Use Topics   Alcohol use: No    Alcohol/week: 0.0 standard drinks of alcohol   Drug use: Yes    Types: Marijuana     Allergies   Ibuprofen  and Tylenol  [acetaminophen ]   Review of Systems Review of Systems  Gastrointestinal:  Positive for abdominal pain.  Per HPI   Physical Exam Triage Vital Signs ED Triage Vitals  Encounter Vitals Group     BP 01/21/24 1957 108/71     Girls Systolic BP Percentile --      Girls Diastolic BP Percentile --      Boys Systolic BP Percentile --      Boys Diastolic BP Percentile --      Pulse Rate  01/21/24 1957 90     Resp 01/21/24 1957 18     Temp 01/21/24 1957 98.1 F (36.7 C)     Temp Source 01/21/24 1957 Oral     SpO2 01/21/24 1957 98 %     Weight --      Height --      Head Circumference --      Peak Flow --      Pain Score 01/21/24 1955 7     Pain Loc --      Pain Education --      Exclude from Growth Chart --    No data found.  Updated Vital Signs BP 108/71 (BP Location: Left Arm)   Pulse 90   Temp 98.1 F (36.7 C) (Oral)   Resp 18   LMP 03/19/2016   SpO2 98%   Visual Acuity Right Eye Distance:   Left Eye Distance:   Bilateral Distance:    Right Eye Near:   Left Eye Near:    Bilateral Near:     Physical Exam Vitals and nursing note reviewed.  Constitutional:      Appearance: She is not ill-appearing or toxic-appearing.  HENT:     Head: Normocephalic and atraumatic.     Right Ear: Hearing and external ear normal.     Left Ear: Hearing and external ear normal.     Nose: Nose normal.     Mouth/Throat:     Lips: Pink.  Eyes:     General: Lids are normal. Vision grossly intact. Gaze aligned appropriately.     Extraocular Movements: Extraocular movements intact.     Conjunctiva/sclera: Conjunctivae normal.  Cardiovascular:     Rate and Rhythm: Normal rate and regular rhythm.     Heart sounds: Normal heart sounds, S1 normal and S2 normal.  Pulmonary:     Effort: Pulmonary effort is normal. No respiratory distress.     Breath sounds: Normal breath sounds and air entry.  Abdominal:  General: Abdomen is flat. Bowel sounds are normal.     Palpations: Abdomen is soft.     Tenderness: There is no abdominal tenderness. There is no right CVA tenderness, left CVA tenderness or guarding. Negative signs include Murphy's sign and McBurney's sign.  Musculoskeletal:     Cervical back: Neck supple.  Skin:    General: Skin is warm and dry.     Capillary Refill: Capillary refill takes less than 2 seconds.     Findings: No rash.  Neurological:     General:  No focal deficit present.     Mental Status: She is alert and oriented to person, place, and time. Mental status is at baseline.     Cranial Nerves: No dysarthria or facial asymmetry.  Psychiatric:        Mood and Affect: Mood normal.        Speech: Speech normal.        Behavior: Behavior normal.        Thought Content: Thought content normal.        Judgment: Judgment normal.      UC Treatments / Results  Labs (all labs ordered are listed, but only abnormal results are displayed) Labs Reviewed - No data to display  EKG   Radiology No results found.  Procedures Procedures (including critical care time)  Medications Ordered in UC Medications  alum & mag hydroxide-simeth (MAALOX/MYLANTA) 200-200-20 MG/5ML suspension 30 mL (30 mLs Oral Given 01/21/24 2003)  lidocaine  (XYLOCAINE ) 2 % viscous mouth solution 15 mL (15 mLs Mouth/Throat Given 01/21/24 2003)    Initial Impression / Assessment and Plan / UC Course  I have reviewed the triage vital signs and the nursing notes.  Pertinent labs & imaging results that were available during my care of the patient were reviewed by me and considered in my medical decision making (see chart for details).   1.  Nausea vomiting, food poisoning Evaluation suggests viral gastrointestinal illness etiology.  Patient nontoxic appearing with hemodynamically stable vital signs, abdominal exam without peritoneal signs/focal tenderness. Will manage this with antiemetic (Zofran ) as needed, OTC medicines as needed for discomfort/pain, increased fluids, and rest. GI cocktail given in clinic with significant improvement in belching and acid reflux. Liquid/bland diet initially, then increase diet as tolerated.   Counseled patient on potential for adverse effects with medications prescribed/recommended today, strict ER and return-to-clinic precautions discussed, patient verbalized understanding.    Final Clinical Impressions(s) / UC Diagnoses   Final  diagnoses:  Nausea and vomiting, unspecified vomiting type  Food poisoning     Discharge Instructions      Your evaluation suggests that your symptoms are most likely due to viral stomach illness (gastroenteritis/stomach bug) which will improve on its own with rest and fluids in the next few days.   Take zofran  to help with nausea every 8 hours as needed. You may use over the counter medicines for aches and pains such as tylenol  as needed.  Start sipping on liquids (broth, water, gatorade, etc). If you are able to keep liquids down without vomiting for 1-2 hours, you may eat bland foods like jello, pudding, applesauce, bananas, rice, and white toast. Once you can tolerate blands, you may return to normal diet.   Pedialyte or gatorolyte may help to prevent/fix dehydration due to vomiting and diarrhea.  Please follow up with your primary care provider for further management. Return if you experience worsening or uncontrolled pain, inability to tolerate fluids by mouth, difficulty breathing, fevers 100.67F  or greater, recurrent vomiting, or any other concerning symptoms.    ED Prescriptions     Medication Sig Dispense Auth. Provider   ondansetron  (ZOFRAN -ODT) 4 MG disintegrating tablet Take 1 tablet (4 mg total) by mouth every 8 (eight) hours as needed for nausea or vomiting. 20 tablet Enedelia Dorna HERO, FNP      PDMP not reviewed this encounter.   Enedelia Dorna HERO, [place] 01/21/24 2045

## 2024-01-21 NOTE — ED Triage Notes (Signed)
 Pt reports food poisoning since yesterday. N/V

## 2024-02-09 ENCOUNTER — Other Ambulatory Visit: Payer: Self-pay | Admitting: Nurse Practitioner

## 2024-02-09 DIAGNOSIS — F5105 Insomnia due to other mental disorder: Secondary | ICD-10-CM

## 2024-02-11 NOTE — Telephone Encounter (Signed)
 Requested Prescriptions  Pending Prescriptions Disp Refills   venlafaxine  XR (EFFEXOR -XR) 75 MG 24 hr capsule [Pharmacy Med Name: VENLAFAXINE  CAP 75MG  ER*] 270 capsule 0    Sig: TAKE 3 CAPSULES BY MOUTH EVERY MORNING WITH BREAKFAST     Psychiatry: Antidepressants - SNRI - desvenlafaxine & venlafaxine  Failed - 02/11/2024  8:24 AM      Failed - Cr in normal range and within 360 days    Creat  Date Value Ref Range Status  07/24/2016 1.01 0.50 - 1.10 mg/dL Final   Creatinine, Ser  Date Value Ref Range Status  01/21/2024 1.15 (H) 0.44 - 1.00 mg/dL Final         Failed - Lipid Panel in normal range within the last 12 months    Cholesterol, Total  Date Value Ref Range Status  12/30/2023 167 100 - 199 mg/dL Final   LDL Chol Calc (NIH)  Date Value Ref Range Status  12/30/2023 106 (H) 0 - 99 mg/dL Final   HDL  Date Value Ref Range Status  12/30/2023 37 (L) >39 mg/dL Final   Triglycerides  Date Value Ref Range Status  12/30/2023 133 0 - 149 mg/dL Final         Passed - Completed PHQ-2 or PHQ-9 in the last 360 days      Passed - Last BP in normal range    BP Readings from Last 1 Encounters:  01/21/24 108/71         Passed - Valid encounter within last 6 months    Recent Outpatient Visits           1 month ago Primary hypertension   [facility] [place]. [facility] Theotis Haze ORN, NP   4 months ago Primary hypertension   [facility] [place]. [facility] Theotis Haze ORN, NP   7 months ago Essential hypertension   [facility] Celestia Rosaline SQUIBB, NP   9 months ago Stress incontinence   [facility] [place]. [facility] Theotis Haze ORN, NP   11 months ago Encounter for annual physical exam   [facility] [place]. [facility] Theotis Haze ORN, NP       Future Appointments              In 1 week Revankar, Jennifer SAUNDERS, MD [facility]

## 2024-02-23 ENCOUNTER — Ambulatory Visit: Admitting: Cardiology

## 2024-03-07 ENCOUNTER — Ambulatory Visit: Payer: Self-pay

## 2024-03-07 NOTE — Telephone Encounter (Signed)
 Noted

## 2024-03-07 NOTE — Telephone Encounter (Signed)
 FYI Only or Action Required?: FYI only for provider.  Patient was last seen in primary care on 12/30/2023 by Theotis Haze ORN, NP.  Called Nurse Triage reporting Appointment.  Symptoms began several weeks ago.  Interventions attempted: OTC medications: acid meds, pepto bismol.  Symptoms are: unchanged.  Triage Disposition: See PCP Within 2 Weeks  Patient/caregiver understands and will follow disposition?: Yes        Copied from CRM [id number]. Topic: Clinical - Red Word Triage >> Mar 07, 2024  3:24 PM Antwanette L wrote: Red Word that prompted transfer to Nurse Triage: The patient reports experiencing , dry throat, heartburn, coughing up a phlegm, and nausea for approximately one month. Reason for Disposition  Nausea is a chronic symptom (recurrent or ongoing AND present > 4 weeks)    Pt has been dealing with chronic heartburn sx along with nausea and reports that she cannot take it anymore. Pt does endorse taking acid medication and peptobismol with no relief. Denies concerns for dehydration.  Scheduled patient on the next available OV appt on March 29, 2024 with PCP .  Answer Assessment - Initial Assessment Questions 1. NAUSEA SEVERITY: How bad is the nausea? (e.g., mild, moderate, severe; dehydration, weight loss)     Pt reports heartburn  2. ONSET: When did the nausea begin?     A few weeks 3. VOMITING: Any vomiting? If Yes, ask: How many times today?     None today 4. RECURRENT SYMPTOM: Have you had nausea before? If Yes, ask: When was the last time? What happened that time?     yes 5. CAUSE: What do you think is causing the nausea?     Thinks r/t heartburn 6. PREGNANCY: Is there any chance you are pregnant? (e.g., unprotected intercourse, missed birth control pill, broken condom)     N/a  Protocols used: Nausea-A-AH

## 2024-03-29 ENCOUNTER — Encounter: Payer: Self-pay | Admitting: Nurse Practitioner

## 2024-03-29 ENCOUNTER — Ambulatory Visit: Attending: Nurse Practitioner | Admitting: Nurse Practitioner

## 2024-03-29 VITALS — BP 104/67 | HR 75 | Resp 19 | Ht 66.0 in | Wt 195.8 lb

## 2024-03-29 DIAGNOSIS — K2100 Gastro-esophageal reflux disease with esophagitis, without bleeding: Secondary | ICD-10-CM

## 2024-03-29 MED ORDER — PANTOPRAZOLE SODIUM 40 MG PO TBEC: 40.0000 mg | DELAYED_RELEASE_TABLET | Freq: Two times a day (BID) | ORAL | 1 refills | Status: AC

## 2024-03-29 MED ORDER — ONDANSETRON 4 MG PO TBDP
4.0000 mg | ORAL_TABLET | Freq: Three times a day (TID) | ORAL | 3 refills | Status: AC | PRN
Start: 2024-03-29 — End: ?

## 2024-03-29 NOTE — Progress Notes (Signed)
 Assessment & Plan:  Brandy Roberts was seen today for heartburn and nausea.  Diagnoses and all orders for this visit:  Gastroesophageal reflux disease with esophagitis without hemorrhage -     pantoprazole  (PROTONIX ) 40 MG tablet; Take 1 tablet (40 mg total) by mouth 2 (two) times daily. -     ondansetron  (ZOFRAN -ODT) 4 MG disintegrating tablet; Take 1 tablet (4 mg total) by mouth every 8 (eight) hours as needed for nausea or vomiting. INSTRUCTIONS: Avoid GERD Triggers: acidic, spicy or fried foods, caffeine , coffee, sodas,  alcohol and chocolate.   Chronic condition managed with pantoprazole . Discussed potential underlying conditions such as stomach ulcer or hernia if symptoms persist or worsen. Endoscopy may be necessary. - Increase pantoprazole  dosage to twice daily. - Advise avoiding greasy and fatty foods. - Consider gastroenterology referral for evaluation, including endoscopy, if symptoms persist or worsen. Intermittent symptoms possibly related to dietary choices and gastroesophageal reflux disease. Zofran  prescribed for symptom relief. - Refill Zofran  prescription. - Advise monitoring symptoms and report if nausea medication is ineffective for further evaluatio  Patient has been counseled on age-appropriate routine health concerns for screening and prevention. These are reviewed and up-to-date. Referrals have been placed accordingly. Immunizations are up-to-date or declined.    Subjective:   Chief Complaint  Patient presents with   Heartburn   Nausea   History of Present Illness Pinkey Roberts is a 53 year old female who presents with persistent heartburn  She has a past medical history of Anemia, Anxiety, Aortic regurgitation, Cardiomyopathy due to hypertension (10/23/2014), Chronic ischemic vertebrobasilar artery thalamic stroke, Depression, Essential hypertension (05/02/2015), Former smoker (01/24/2015), H/O noncompliance with medical treatment, presenting hazards to health,  Headache (05/01/2015), History of ischemic middle cerebral artery stroke embolic, Hyperlipidemia, Hypertension, Hypertensive heart disease, Migraine (05/21/2018), Migraines, Nonrheumatic aortic (valve) insufficiency, Osteoarthritis, Overweight (BMI 25.0-29.9), Palpitations (10/12/2018), Sleep apnea, Snoring (05/02/2015), Stroke (10/25/2016), and Syncope and collapse (10/07/2018).    She experiences severe heartburn, particularly after eating out at restaurants, which leads to nausea and vomiting shortly after returning home. Despite taking pantoprazole  twice daily, she continues to have symptoms when consuming greasy or fatty foods.  She started taking pantoprazole  once in the morning and once in the evening, which has helped reduce her heartburn symptoms, but she still experiences nausea and vomiting with certain foods. She states today she forgot about zofran  that had been prescribed for her nausea.   She mentions a previous prescription of ANSAID  from her neurologist for headaches. She is unsure about the current status of this medication and its availability at her pharmacy.     Review of Systems  Constitutional:  Negative for fever, malaise/fatigue and weight loss.  HENT: Negative.  Negative for nosebleeds.   Respiratory: Negative.  Negative for cough and shortness of breath.   Cardiovascular: Negative.  Negative for chest pain, palpitations and leg swelling.  Gastrointestinal:  Positive for heartburn, nausea and vomiting. Negative for abdominal pain, blood in stool, constipation and diarrhea.  Musculoskeletal: Negative.  Negative for myalgias.  Neurological: Negative.  Negative for dizziness, focal weakness, seizures and headaches.  Psychiatric/Behavioral: Negative.  Negative for suicidal ideas.     Past Medical History:  Diagnosis Date   Anemia    Anxiety    Aortic regurgitation    Cardiomyopathy due to hypertension [name] 10/23/2014   Chest discomfort 10/12/2018   Chronic ischemic  vertebrobasilar artery thalamic stroke    Depression    Essential hypertension 05/02/2015   Facet arthropathy 05/15/2021   Former  smoker 01/24/2015   H/O noncompliance with medical treatment, presenting hazards to health    Headache 05/01/2015   Hemiparesis affecting left side as late effect of cerebrovascular accident [name] 07/11/2022   History of ischemic middle cerebral artery stroke embolic    Hyperlipidemia    Hypertension    Hypertensive heart disease    Lumbar radiculopathy 05/01/2021   Migraine 05/21/2018   Migraines    Mixed dyslipidemia 11/26/2020   Nonrheumatic aortic (valve) insufficiency    Obesity (BMI 35.0-39.9 without comorbidity) 11/26/2020   Osteoarthritis    Overweight (BMI 25.0-29.9)    Palpitations 10/12/2018   SI joint arthritis 05/15/2021   Sleep apnea    Snoring 05/02/2015   Stroke [name] 10/25/2016   Syncope and collapse 10/07/2018   Vitamin D  deficiency 05/15/2021    Past Surgical History:  Procedure Laterality Date   CESAREAN SECTION     GANGLION CYST EXCISION     TEE WITHOUT CARDIOVERSION N/A 10/09/2014   Procedure: TRANSESOPHAGEAL ECHOCARDIOGRAM (TEE);  Surgeon: Jerel Balding, MD;  Location: [facility] ENDOSCOPY;  Service: Cardiovascular;  Laterality: N/A;   TONSILLECTOMY      Family History  Problem Relation Age of Onset   Cancer Mother        type unknown   Hypertension Mother    Hypertension Sister    Diabetes Sister    Cancer Maternal Aunt        4 aunts died of cancer types unknown    Social History Reviewed with no changes to be made today.   Outpatient Medications Prior to Visit  Medication Sig Dispense Refill   amLODipine  (NORVASC ) 10 MG tablet Take 1 tablet (10 mg total) by mouth daily. 90 tablet 1   Atogepant  (QULIPTA ) 60 MG TABS Take 1 tablet (60 mg total) by mouth daily. 30 tablet 11   atorvastatin  (LIPITOR ) 80 MG tablet TAKE 1 TABLET BY MOUTH EVERY DAY AT 6PM FOR CHOLESTEROL 90 tablet 1   busPIRone  (BUSPAR ) 10 MG tablet Take 1  tablet (10 mg total) by mouth 3 (three) times daily. 90 tablet 3   chlorhexidine  (HIBICLENS ) 4 % external liquid Apply topically daily as needed. 1000 mL 1   clopidogrel  (PLAVIX ) 75 MG tablet Take 1 tablet (75 mg total) by mouth daily. 90 tablet 3   flurbiprofen  (ANSAID ) 100 MG tablet TAKE 1 TABLET BY MOUTH EVERY 8 HOURS AS NEEDED (NOT MORE THAN 3 TABLETS A DAY) 30 tablet 5   hydrALAZINE  (APRESOLINE ) 50 MG tablet Take 1 tablet (50 mg total) by mouth 3 (three) times daily. 270 tablet 1   hydrochlorothiazide  (MICROZIDE ) 12.5 MG capsule TAKE 1 CAPSULE BY MOUTH DAILY 90 capsule 1   hydrOXYzine (ATARAX/VISTARIL) 50 MG tablet Take 50 mg by mouth 3 (three) times daily as needed for anxiety or sleep.     lisinopril  (ZESTRIL ) 10 MG tablet TAKE 1 TABLET BY MOUTH EVERY DAY 90 tablet 1   metoCLOPramide  (REGLAN ) 10 MG tablet Take 10 mg by mouth every 6 (six) hours as needed for nausea or headache.     metoprolol  tartrate (LOPRESSOR ) 25 MG tablet Take 1 tablet (25 mg total) by mouth 2 (two) times daily. 180 tablet 1   Misc. Devices MISC Please provide patient with extra-large sized incontinence briefs N39.3 1 each 0   mupirocin  ointment (BACTROBAN ) 2 % Apply 1 Application topically 2 (two) times daily. Apply to back of head 200 g 3   nitroGLYCERIN  (NITROSTAT ) 0.4 MG SL tablet Place 1 tablet (0.4 mg total) under  the tongue every 5 (five) minutes as needed for chest pain. 30 tablet 1   spironolactone  (ALDACTONE ) 25 MG tablet Take 1 tablet (25 mg total) by mouth daily. 90 tablet 1   traZODone  (DESYREL ) 100 MG tablet Take 1 tablet (100 mg total) by mouth at bedtime. 90 tablet 1   venlafaxine  XR (EFFEXOR -XR) 75 MG 24 hr capsule TAKE 3 CAPSULES BY MOUTH EVERY MORNING WITH BREAKFAST 270 capsule 0   ondansetron  (ZOFRAN -ODT) 4 MG disintegrating tablet Take 1 tablet (4 mg total) by mouth every 8 (eight) hours as needed for nausea or vomiting. 20 tablet 0   pantoprazole  (PROTONIX ) 40 MG tablet TAKE 1 TABLET BY MOUTH EACH  DAY BEFORE BREAKFAST FOR ACID REFLUX 90 tablet 1   No facility-administered medications prior to visit.    Allergies  Allergen Reactions   Ibuprofen  Other (See Comments)    Can not take with current medications .   Tylenol  [Acetaminophen ] Other (See Comments)    Pt stated tylenol  gives her extreme headache       Objective:    BP 104/67 (BP Location: Right Arm, Patient Position: Sitting, Cuff Size: Normal)   Pulse 75   Resp 19   Ht 5' 6 (1.676 m)   Wt 195 lb 12.8 oz (88.8 kg)   LMP 03/19/2016   SpO2 100%   BMI 31.60 kg/m  Wt Readings from Last 3 Encounters:  03/29/24 195 lb 12.8 oz (88.8 kg)  12/30/23 192 lb 12.8 oz (87.5 kg)  10/09/23 198 lb 3.2 oz (89.9 kg)    Physical Exam Vitals and nursing note reviewed.  Constitutional:      Appearance: She is well-developed.  HENT:     Head: Normocephalic and atraumatic.  Cardiovascular:     Rate and Rhythm: Normal rate and regular rhythm.     Heart sounds: Normal heart sounds. No murmur heard.    No friction rub. No gallop.  Pulmonary:     Effort: Pulmonary effort is normal. No tachypnea or respiratory distress.     Breath sounds: Normal breath sounds. No decreased breath sounds, wheezing, rhonchi or rales.  Chest:     Chest wall: No tenderness.  Musculoskeletal:        General: Normal range of motion.     Cervical back: Normal range of motion.  Skin:    General: Skin is warm and dry.  Neurological:     Mental Status: She is alert and oriented to person, place, and time.     Coordination: Coordination normal.  Psychiatric:        Behavior: Behavior normal. Behavior is cooperative.        Thought Content: Thought content normal.        Judgment: Judgment normal.          Patient has been counseled extensively about nutrition and exercise as well as the importance of adherence with medications and regular follow-up. The patient was given clear instructions to go to ER or return to medical center if symptoms don't  improve, worsen or new problems develop. The patient verbalized understanding.   Follow-up: Return in about 3 months (around 06/29/2024).   Haze LELON Servant, FNP-BC [facility] [place], [place] [phone]   03/29/2024, 5:59 PM

## 2024-04-05 ENCOUNTER — Ambulatory Visit: Admitting: Nurse Practitioner

## 2024-04-06 ENCOUNTER — Ambulatory Visit: Payer: Self-pay

## 2024-04-13 ENCOUNTER — Ambulatory Visit: Admitting: Cardiology

## 2024-04-17 ENCOUNTER — Ambulatory Visit (HOSPITAL_COMMUNITY)
Admission: EM | Admit: 2024-04-17 | Discharge: 2024-04-17 | Disposition: A | Discharge status: Home / Self Care | Attending: Emergency Medicine | Admitting: Emergency Medicine

## 2024-04-17 ENCOUNTER — Other Ambulatory Visit: Payer: Self-pay

## 2024-04-17 ENCOUNTER — Encounter (HOSPITAL_COMMUNITY): Payer: Self-pay | Admitting: Emergency Medicine

## 2024-04-17 DIAGNOSIS — R519 Headache, unspecified: Secondary | ICD-10-CM

## 2024-04-17 MED ORDER — SUMATRIPTAN SUCCINATE 6 MG/0.5ML ~~LOC~~ SOLN
SUBCUTANEOUS | Status: AC
  Filled 2024-04-17: qty 0.5

## 2024-04-17 MED ORDER — DEXAMETHASONE SOD PHOSPHATE PF 10 MG/ML IJ SOLN
10.0000 mg | Freq: Once | INTRAMUSCULAR | Status: AC
  Administered 2024-04-17: 10 mg via INTRAMUSCULAR

## 2024-04-17 MED ORDER — SUMATRIPTAN SUCCINATE 6 MG/0.5ML ~~LOC~~ SOLN: 6.0000 mg | Freq: Once | SUBCUTANEOUS | Status: DC

## 2024-04-17 NOTE — ED Triage Notes (Signed)
 Right ear pain and headache for 2 days.  Pain below right ear .  Has not had any medications for symptoms

## 2024-04-17 NOTE — ED Provider Notes (Signed)
 [facility]    CSN: [id number] Arrival date & time: 04/17/24  1653      History   Chief Complaint Chief Complaint  Patient presents with   Otalgia    HPI Brandy Roberts is a 53 y.o. female.  Poor historian  Right sided head pain for 2 days. Feels pain in the forehead, ear, neck. History of migraine followed by neurology. Supposed to take venlafaxine  daily. Has not in a few days. Blurry vision in both eyes, not sure if this is new May or may not have dizziness.  She denies jaw pain, chest pain or pressure, shortness of breath, nausea or vomiting, abdominal pain  Past Medical History:  Diagnosis Date   Anemia    Anxiety    Aortic regurgitation    Cardiomyopathy due to hypertension [name] 10/23/2014   Chest discomfort 10/12/2018   Chronic ischemic vertebrobasilar artery thalamic stroke    Depression    Essential hypertension 05/02/2015   Facet arthropathy 05/15/2021   Former smoker 01/24/2015   H/O noncompliance with medical treatment, presenting hazards to health    Headache 05/01/2015   Hemiparesis affecting left side as late effect of cerebrovascular accident [name] 07/11/2022   History of ischemic middle cerebral artery stroke embolic    Hyperlipidemia    Hypertension    Hypertensive heart disease    Lumbar radiculopathy 05/01/2021   Migraine 05/21/2018   Migraines    Mixed dyslipidemia 11/26/2020   Nonrheumatic aortic (valve) insufficiency    Obesity (BMI 35.0-39.9 without comorbidity) 11/26/2020   Osteoarthritis    Overweight (BMI 25.0-29.9)    Palpitations 10/12/2018   SI joint arthritis 05/15/2021   Sleep apnea    Snoring 05/02/2015   Stroke [name] 10/25/2016   Syncope and collapse 10/07/2018   Vitamin D  deficiency 05/15/2021    Patient Active Problem List   Diagnosis Date Noted   Cigarette smoker 02/18/2023   Hemiparesis affecting left side as late effect of cerebrovascular accident [name] 07/11/2022   Vitamin D  deficiency 05/15/2021    Facet arthropathy 05/15/2021   SI joint arthritis 05/15/2021   Lumbar radiculopathy 05/01/2021   Obesity (BMI 35.0-39.9 without comorbidity) 11/26/2020   Mixed dyslipidemia 11/26/2020   Anemia    Anxiety    Depression    Hypertension    Migraines    Nonrheumatic aortic (valve) insufficiency    Osteoarthritis    Sleep apnea    Palpitations 10/12/2018   Chest discomfort 10/12/2018   Syncope and collapse 10/07/2018   Migraine 05/21/2018   Stroke [name] 10/25/2016   Snoring 05/02/2015   Essential hypertension 05/02/2015   Headache 05/01/2015   Former smoker 01/24/2015   Cardiomyopathy due to hypertension [name] 10/23/2014   Chronic ischemic vertebrobasilar artery thalamic stroke    Hypertensive heart disease    Overweight (BMI 25.0-29.9)    H/O noncompliance with medical treatment, presenting hazards to health    Aortic regurgitation    History of ischemic middle cerebral artery stroke embolic    Hyperlipidemia     Past Surgical History:  Procedure Laterality Date   CESAREAN SECTION     GANGLION CYST EXCISION     TEE WITHOUT CARDIOVERSION N/A 10/09/2014   Procedure: TRANSESOPHAGEAL ECHOCARDIOGRAM (TEE);  Surgeon: Jerel Balding, MD;  Location: [facility] ENDOSCOPY;  Service: Cardiovascular;  Laterality: N/A;   TONSILLECTOMY      OB History   No obstetric history on file.      Home Medications    Prior to Admission medications   Medication  Sig Start Date End Date Taking? Authorizing Provider  amLODipine  (NORVASC ) 10 MG tablet Take 1 tablet (10 mg total) by mouth daily. 09/30/23   Fleming, Zelda W, NP  Atogepant  (QULIPTA ) 60 MG TABS Take 1 tablet (60 mg total) by mouth daily. 10/31/22   Skeet, Adam R, DO  atorvastatin  (LIPITOR ) 80 MG tablet TAKE 1 TABLET BY MOUTH EVERY DAY AT 6PM FOR CHOLESTEROL 03/10/23   Fleming, Zelda W, NP  busPIRone  (BUSPAR ) 10 MG tablet Take 1 tablet (10 mg total) by mouth 3 (three) times daily. 01/28/21   Theotis Haze ORN, NP  chlorhexidine  (HIBICLENS ) 4 %  external liquid Apply topically daily as needed. 03/10/23   Fleming, Zelda W, NP  clopidogrel  (PLAVIX ) 75 MG tablet Take 1 tablet (75 mg total) by mouth daily. 09/30/23   Fleming, Zelda W, NP  flurbiprofen  (ANSAID ) 100 MG tablet TAKE 1 TABLET BY MOUTH EVERY 8 HOURS AS NEEDED (NOT MORE THAN 3 TABLETS A DAY) 09/30/23   Skeet, Adam R, DO  hydrALAZINE  (APRESOLINE ) 50 MG tablet Take 1 tablet (50 mg total) by mouth 3 (three) times daily. 09/30/23   Fleming, Zelda W, NP  hydrochlorothiazide  (MICROZIDE ) 12.5 MG capsule TAKE 1 CAPSULE BY MOUTH DAILY 12/22/23   Fleming, Zelda W, NP  hydrOXYzine (ATARAX/VISTARIL) 50 MG tablet Take 50 mg by mouth 3 (three) times daily as needed for anxiety or sleep.    [provider]  lisinopril  (ZESTRIL ) 10 MG tablet TAKE 1 TABLET BY MOUTH EVERY DAY 12/22/23   Fleming, Zelda W, NP  metoCLOPramide  (REGLAN ) 10 MG tablet Take 10 mg by mouth every 6 (six) hours as needed for nausea or headache.    [provider]  metoprolol  tartrate (LOPRESSOR ) 25 MG tablet Take 1 tablet (25 mg total) by mouth 2 (two) times daily. 12/30/23   Theotis Haze ORN, NP  Misc. Devices MISC Please provide patient with extra-large sized incontinence briefs N39.3 08/18/23   Fleming, Zelda W, NP  mupirocin  ointment (BACTROBAN ) 2 % Apply 1 Application topically 2 (two) times daily. Apply to back of head 07/08/23   Fleming, Zelda W, NP  nitroGLYCERIN  (NITROSTAT ) 0.4 MG SL tablet Place 1 tablet (0.4 mg total) under the tongue every 5 (five) minutes as needed for chest pain. 09/30/23   Fleming, Zelda W, NP  ondansetron  (ZOFRAN -ODT) 4 MG disintegrating tablet Take 1 tablet (4 mg total) by mouth every 8 (eight) hours as needed for nausea or vomiting. 03/29/24   Fleming, Zelda W, NP  pantoprazole  (PROTONIX ) 40 MG tablet Take 1 tablet (40 mg total) by mouth 2 (two) times daily. 03/29/24   Fleming, Zelda W, NP  spironolactone  (ALDACTONE ) 25 MG tablet Take 1 tablet (25 mg total) by mouth daily. 09/30/23   Fleming,  Zelda W, NP  traZODone  (DESYREL ) 100 MG tablet Take 1 tablet (100 mg total) by mouth at bedtime. 09/30/23   Fleming, Zelda W, NP  venlafaxine  XR (EFFEXOR -XR) 75 MG 24 hr capsule TAKE 3 CAPSULES BY MOUTH EVERY MORNING WITH BREAKFAST 02/11/24   Theotis Haze ORN, NP    Family History Family History  Problem Relation Age of Onset   Cancer Mother        type unknown   Hypertension Mother    Hypertension Sister    Diabetes Sister    Cancer Maternal Aunt        4 aunts died of cancer types unknown    Social History Social History   Tobacco Use   Smoking status: Former  Current packs/day: 0.00    Types: Cigarettes    Quit date: 08/31/2017    Years since quitting: 6.6   Smokeless tobacco: Never  Vaping Use   Vaping status: Never Used  Substance Use Topics   Alcohol use: No    Alcohol/week: 0.0 standard drinks of alcohol   Drug use: Yes    Types: Marijuana     Allergies   Ibuprofen  and Tylenol  [acetaminophen ]   Review of Systems Review of Systems As per HPI  Physical Exam Triage Vital Signs ED Triage Vitals  Encounter Vitals Group     BP 04/17/24 1913 114/71     Girls Systolic BP Percentile --      Girls Diastolic BP Percentile --      Boys Systolic BP Percentile --      Boys Diastolic BP Percentile --      Pulse Rate 04/17/24 1913 60     Resp 04/17/24 1913 20     Temp 04/17/24 1913 97.8 F (36.6 C)     Temp Source 04/17/24 1913 Oral     SpO2 04/17/24 1913 98 %     Weight --      Height --      Head Circumference --      Peak Flow --      Pain Score 04/17/24 1908 10     Pain Loc --      Pain Education --      Exclude from Growth Chart --    No data found.  Updated Vital Signs BP 114/71 (BP Location: Right Arm) Comment (BP Location): large cuff  Pulse 60   Temp 97.8 F (36.6 C) (Oral)   Resp 20   LMP 03/19/2016   SpO2 98%    Physical Exam Vitals and nursing note reviewed.  Constitutional:      General: She is not in acute distress.     Appearance: She is not ill-appearing.     Comments: Tearful during exam, uncomfortable appearing   HENT:     Head:     Jaw: There is normal jaw occlusion. No tenderness or swelling.     Right Ear: Tympanic membrane and ear canal normal.     Left Ear: Tympanic membrane and ear canal normal.     Ears:     Comments: Pain reported with right ear exam. Normal canal and TM. No tragal tenderness     Nose: No congestion.     Mouth/Throat:     Mouth: Mucous membranes are moist.     Pharynx: Oropharynx is clear. No posterior oropharyngeal erythema.  Eyes:     Extraocular Movements: Extraocular movements intact.     Conjunctiva/sclera: Conjunctivae normal.     Pupils: Pupils are equal, round, and reactive to light.  Neck:     Trachea: Trachea and phonation normal.  Cardiovascular:     Rate and Rhythm: Normal rate and regular rhythm.     Pulses: Normal pulses.     Heart sounds: Normal heart sounds.  Pulmonary:     Effort: Pulmonary effort is normal.     Breath sounds: Normal breath sounds.  Abdominal:     Palpations: Abdomen is soft.  Musculoskeletal:     Cervical back: Normal range of motion. No spinous process tenderness or muscular tenderness. Normal range of motion.  Neurological:     Mental Status: She is alert and oriented to person, place, and time.      UC Treatments / Results  Labs (all  labs ordered are listed, but only abnormal results are displayed) Labs Reviewed - No data to display  EKG   Radiology No results found.  Procedures Procedures  Medications Ordered in UC Medications  dexamethasone  (DECADRON ) injection 10 mg (10 mg Intramuscular Given 04/17/24 2030)    Initial Impression / Assessment and Plan / UC Course  I have reviewed the triage vital signs and the nursing notes.  Pertinent labs & imaging results that were available during my care of the patient were reviewed by me and considered in my medical decision making (see chart for details).  Patient  reporting 10/10 pain She is neurologically intact, stable vitals She cannot determine if the pain is inside or head, or if she has pain when I touch her head. She does shy away from touch and is tearful with exam.  Avoid NSAIDs due to plavix  use, and has tylenol  intolerance (headaches). Offered IM sumatriptan and decadron  for acute migraine relief.  Initially discussed with patient with her severity of pain she really should be evaluated in the emergency department. She does not want to go to the hospital. She is agreeable to injections.  However after the decadron  was given, she then refused the imitrex.  Patient is asking to go home now.  I have discussed with her it can take about 30 minutes for the medicine to start working. Discussed that if after 1 hour she has no change in her pain level, to please go directly to the emergency department.  States understanding. Her partner is bedside and also verbalizes understanding.   Again I have recommended emergency department evaluation, patient continues to decline.   Final Clinical Impressions(s) / UC Diagnoses   Final diagnoses:  Bad headache  Right-sided headache     Discharge Instructions      Please monitor symptoms closely If in the next hour your pain does not improve, please go directly to the emergency department.     ED Prescriptions   None    PDMP not reviewed this encounter.   Tiny Rietz, PA-C 04/17/24 2044

## 2024-04-17 NOTE — Discharge Instructions (Addendum)
 Please monitor symptoms closely If in the next hour your pain does not improve, please go directly to the emergency department.

## 2024-04-18 ENCOUNTER — Emergency Department (HOSPITAL_COMMUNITY)

## 2024-04-18 ENCOUNTER — Encounter (HOSPITAL_COMMUNITY): Payer: Self-pay | Admitting: Emergency Medicine

## 2024-04-18 ENCOUNTER — Emergency Department (HOSPITAL_COMMUNITY)
Admission: EM | Admit: 2024-04-18 | Discharge: 2024-04-19 | Disposition: A | Discharge status: Home / Self Care | Attending: Emergency Medicine | Admitting: Emergency Medicine

## 2024-04-18 DIAGNOSIS — R079 Chest pain, unspecified: Secondary | ICD-10-CM | POA: Diagnosis present

## 2024-04-18 DIAGNOSIS — R0789 Other chest pain: Secondary | ICD-10-CM | POA: Insufficient documentation

## 2024-04-18 DIAGNOSIS — Z79899 Other long term (current) drug therapy: Secondary | ICD-10-CM | POA: Insufficient documentation

## 2024-04-18 DIAGNOSIS — Z7902 Long term (current) use of antithrombotics/antiplatelets: Secondary | ICD-10-CM | POA: Diagnosis not present

## 2024-04-18 DIAGNOSIS — I10 Essential (primary) hypertension: Secondary | ICD-10-CM | POA: Diagnosis not present

## 2024-04-18 DIAGNOSIS — F17210 Nicotine dependence, cigarettes, uncomplicated: Secondary | ICD-10-CM | POA: Insufficient documentation

## 2024-04-18 LAB — CBC WITH DIFFERENTIAL/PLATELET
Abs Immature Granulocytes: 0.12 K/uL — ABNORMAL HIGH (ref 0.00–0.07)
Basophils Absolute: 0 K/uL (ref 0.0–0.1)
Basophils Relative: 0 %
Eosinophils Absolute: 0 K/uL (ref 0.0–0.5)
Eosinophils Relative: 0 %
HCT: 37.3 % (ref 36.0–46.0)
Hemoglobin: 12 g/dL (ref 12.0–15.0)
Immature Granulocytes: 1 %
Lymphocytes Relative: 7 %
Lymphs Abs: 1.6 K/uL (ref 0.7–4.0)
MCH: 27.6 pg (ref 26.0–34.0)
MCHC: 32.2 g/dL (ref 30.0–36.0)
MCV: 85.9 fL (ref 80.0–100.0)
Monocytes Absolute: 1 K/uL (ref 0.1–1.0)
Monocytes Relative: 4 %
Neutro Abs: 20.7 K/uL — ABNORMAL HIGH (ref 1.7–7.7)
Neutrophils Relative %: 88 %
Platelets: 255 K/uL (ref 150–400)
RBC: 4.34 MIL/uL (ref 3.87–5.11)
RDW: 14.6 % (ref 11.5–15.5)
WBC: 23.5 K/uL — ABNORMAL HIGH (ref 4.0–10.5)
nRBC: 0 % (ref 0.0–0.2)

## 2024-04-18 NOTE — ED Provider Triage Note (Signed)
 Emergency Medicine Provider Triage Evaluation Note  Brandy Roberts , a 54 y.o. female  was evaluated in triage.  Pt complains of chest pain.  Started this afternoon.  Described as sharp.  Nonradiating.  No shortness of breath or palpitations.  Denies cardiac history.  Low risk for PE based off of Wells criteria.  Symptoms exacerbated by movement or coughing.  Better with sitting still  Review of Systems  Positive: Chest pain Negative: Shortness of  Physical Exam  BP 127/65 (BP Location: Right Arm)   Pulse 83   Temp 98.1 F (36.7 C) (Oral)   Resp 16   LMP 03/19/2016   SpO2 95%  Gen:   Awake, no distress   Resp:  Normal effort  MSK:   Moves extremities without difficulty  Other:  Equal pulses.  Not in distress.  No lower extremity edema  Medical Decision Making  Medically screening exam initiated at 11:13 PM.  Appropriate orders placed.  Brandy Roberts was informed that the remainder of the evaluation will be completed by another provider, this initial triage assessment does not replace that evaluation, and the importance of remaining in the ED until their evaluation is complete.  53 year old with chest pain.  Suspect MSK versus pleuritis.  However will get labs to evaluate for underlying cardiac etiology.  Low suspicion for PE   Neysa Caron PARAS, DO 04/18/24 2313

## 2024-04-18 NOTE — ED Triage Notes (Addendum)
 From home reports chest pain about 2130. Just lit a cigarette and was on the way home. Sharp central chest pain non radiating. Seen for UC for ear infection. Cough but denies fevers. Pt having sulfurous belching and reflux as well as pain worse with lying down. Worse with movement.

## 2024-04-18 NOTE — ED Notes (Signed)
 Pt seen by EDP and lobby appropriate.

## 2024-04-19 ENCOUNTER — Ambulatory Visit: Payer: Self-pay

## 2024-04-19 DIAGNOSIS — R0789 Other chest pain: Secondary | ICD-10-CM | POA: Diagnosis not present

## 2024-04-19 LAB — COMPREHENSIVE METABOLIC PANEL WITH GFR
ALT: 21 U/L (ref 0–44)
AST: 22 U/L (ref 15–41)
Albumin: 3.5 g/dL (ref 3.5–5.0)
Alkaline Phosphatase: 91 U/L (ref 38–126)
Anion gap: 12 (ref 5–15)
BUN: 25 mg/dL — ABNORMAL HIGH (ref 6–20)
CO2: 24 mmol/L (ref 22–32)
Calcium: 9.3 mg/dL (ref 8.9–10.3)
Chloride: 101 mmol/L (ref 98–111)
Creatinine, Ser: 1.18 mg/dL — ABNORMAL HIGH (ref 0.44–1.00)
GFR, Estimated: 55 mL/min — ABNORMAL LOW (ref >60)
Glucose, Bld: 168 mg/dL — ABNORMAL HIGH (ref 70–99)
Potassium: 3.6 mmol/L (ref 3.5–5.1)
Sodium: 137 mmol/L (ref 135–145)
Total Bilirubin: 0.5 mg/dL (ref 0.0–1.2)
Total Protein: 7.6 g/dL (ref 6.5–8.1)

## 2024-04-19 LAB — TROPONIN I (HIGH SENSITIVITY)
Troponin I (High Sensitivity): 10 ng/L (ref <18)
Troponin I (High Sensitivity): 17 ng/L (ref <18)

## 2024-04-19 MED ORDER — LIDOCAINE VISCOUS HCL 2 % MT SOLN
15.0000 mL | Freq: Once | OROMUCOSAL | Status: AC
  Administered 2024-04-19: 15 mL via ORAL
  Filled 2024-04-19: qty 15

## 2024-04-19 MED ORDER — SUCRALFATE 1 G PO TABS: 1.0000 g | ORAL_TABLET | Freq: Three times a day (TID) | ORAL | 0 refills | Status: DC

## 2024-04-19 MED ORDER — ALUM & MAG HYDROXIDE-SIMETH 200-200-20 MG/5ML PO SUSP
30.0000 mL | Freq: Once | ORAL | Status: AC
  Administered 2024-04-19: 30 mL via ORAL
  Filled 2024-04-19: qty 30

## 2024-04-19 MED ORDER — SUCRALFATE 1 G PO TABS: 1.0000 g | ORAL_TABLET | Freq: Three times a day (TID) | ORAL | 0 refills | Status: AC

## 2024-04-19 NOTE — ED Notes (Signed)
 Pt refused vitals states she does not need them

## 2024-04-19 NOTE — Discharge Instructions (Addendum)
 I suspect that your symptoms are related to reflux.  Continue taking your home medications and follow-up with your primary care doctor.  I suspect that the Protonix  that you are prescribed is what you are taking but confirm this when you get home.  I would add on Carafate which is an additional medication.  Smoking cigarettes/nicotine  in general can relax the lower esophageal sphincter which can worsen reflux.  Please read the diet information I have printed for you.  Return to the emergency room for any new or concerning symptoms as we discussed.

## 2024-04-19 NOTE — Telephone Encounter (Signed)
 Patient states that she is at [facility] --Patient states that the nurse in the hospital told her that it sounds like she had a light heart attack --patient states that she has been there for a long time and is not in a room yet and is getting ready to leave --This RN advised her that the recommendation would be to stay there and continue to be seen and evaluated --Patient states that she is tired of waiting and is going to leave at this time --She is advised to speak with someone in the facility to let them know she is leaving if she plans on doing so and she advised that she is going to go tell them now that she is leaving. --This RN advised her to call us  back with any other questions or concerns but it is still recommended that she stays there to be seen  Attempted to call PCP office [place] and no answer at this time  Patient was last seen in primary care on 03/29/2024 by Theotis Haze ORN, NP.     Copied from CRM #[id number]. Topic: Clinical - Red Word Triage >> Apr 19, 2024  7:57 AM Everette C wrote: [facility] that prompted transfer to Nurse Triage: The patient is currently at [facility] with cardiac concerns, but has additional questions for triage staff Answer Assessment - Initial Assessment Questions Patient states that she is at [facility] --Patient states that the nurse in the hospital told her that it sounds like she had a light heart attack --patient states that she has been there for a long time and is not in a room yet and is getting ready to leave --This RN advised her that the recommendation would be to stay there and continue to be seen and evaluated --Patient states that she is tired of waiting and is going to leave at this time --She is advised to speak with someone in the facility to let them know she is leaving if she plans on doing so and she advised that she is going to go tell them now that she is leaving. --This RN advised her to call us  back with any other  questions or concerns but it is still recommended that she stays there to be seen  Attempted to call PCP office [place] and no answer at this time  Protocols used: Information Only Call - No Triage-A-AH

## 2024-04-19 NOTE — ED Provider Notes (Signed)
 [place] AT [facility] Provider Note   CSN: [id number] Arrival date & time: 04/18/24  2215     Patient presents with: Chest Pain   Brandy Roberts is a 53 y.o. female.    Chest Pain  Patient with past medical history significant for anxiety, depression, hypertension, 3 cigarettes a day smoker, stroke  Patient presents emergency room today with complaints of chest pain that started yesterday afternoon described as a sharp pain that began right after she took a deep drag on a cigarette she states that she coughed some immediately and felt a sharp pain.  No hemoptysis she does not feel short of breath she feels that her chest pain is mostly better now it seems that has gradually improved since this occurred she also has been having some belching with sulfur like taste and feels like she has some burning chest discomfort at times that seems to be worse when she lays down.  She denies any exertional chest pain and states she has not had any nausea or vomiting.      Prior to Admission medications   Medication Sig Start Date End Date Taking? Authorizing Provider  amLODipine  (NORVASC ) 10 MG tablet Take 1 tablet (10 mg total) by mouth daily. 09/30/23   Fleming, Zelda W, NP  Atogepant  (QULIPTA ) 60 MG TABS Take 1 tablet (60 mg total) by mouth daily. 10/31/22   Skeet, Adam R, DO  atorvastatin  (LIPITOR ) 80 MG tablet TAKE 1 TABLET BY MOUTH EVERY DAY AT 6PM FOR CHOLESTEROL 03/10/23   Fleming, Zelda W, NP  busPIRone  (BUSPAR ) 10 MG tablet Take 1 tablet (10 mg total) by mouth 3 (three) times daily. 01/28/21   Theotis Haze ORN, NP  chlorhexidine  (HIBICLENS ) 4 % external liquid Apply topically daily as needed. 03/10/23   Fleming, Zelda W, NP  clopidogrel  (PLAVIX ) 75 MG tablet Take 1 tablet (75 mg total) by mouth daily. 09/30/23   Fleming, Zelda W, NP  flurbiprofen  (ANSAID ) 100 MG tablet TAKE 1 TABLET BY MOUTH EVERY 8 HOURS AS NEEDED (NOT MORE THAN 3 TABLETS A DAY) 09/30/23    Skeet, Adam R, DO  hydrALAZINE  (APRESOLINE ) 50 MG tablet Take 1 tablet (50 mg total) by mouth 3 (three) times daily. 09/30/23   Fleming, Zelda W, NP  hydrochlorothiazide  (MICROZIDE ) 12.5 MG capsule TAKE 1 CAPSULE BY MOUTH DAILY 12/22/23   Fleming, Zelda W, NP  hydrOXYzine (ATARAX/VISTARIL) 50 MG tablet Take 50 mg by mouth 3 (three) times daily as needed for anxiety or sleep.    [provider]  lisinopril  (ZESTRIL ) 10 MG tablet TAKE 1 TABLET BY MOUTH EVERY DAY 12/22/23   Fleming, Zelda W, NP  metoCLOPramide  (REGLAN ) 10 MG tablet Take 10 mg by mouth every 6 (six) hours as needed for nausea or headache.    [provider]  metoprolol  tartrate (LOPRESSOR ) 25 MG tablet Take 1 tablet (25 mg total) by mouth 2 (two) times daily. 12/30/23   Theotis Haze ORN, NP  Misc. Devices MISC Please provide patient with extra-large sized incontinence briefs N39.3 08/18/23   Fleming, Zelda W, NP  mupirocin  ointment (BACTROBAN ) 2 % Apply 1 Application topically 2 (two) times daily. Apply to back of head 07/08/23   Fleming, Zelda W, NP  nitroGLYCERIN  (NITROSTAT ) 0.4 MG SL tablet Place 1 tablet (0.4 mg total) under the tongue every 5 (five) minutes as needed for chest pain. 09/30/23   Fleming, Zelda W, NP  ondansetron  (ZOFRAN -ODT) 4 MG disintegrating tablet Take 1 tablet (4  mg total) by mouth every 8 (eight) hours as needed for nausea or vomiting. 03/29/24   Fleming, Zelda W, NP  pantoprazole  (PROTONIX ) 40 MG tablet Take 1 tablet (40 mg total) by mouth 2 (two) times daily. 03/29/24   Fleming, Zelda W, NP  spironolactone  (ALDACTONE ) 25 MG tablet Take 1 tablet (25 mg total) by mouth daily. 09/30/23   Fleming, Zelda W, NP  sucralfate (CARAFATE) 1 g tablet Take 1 tablet (1 g total) by mouth 3 (three) times daily before meals. 04/19/24 05/19/24  Neldon Hamp RAMAN, PA  traZODone  (DESYREL ) 100 MG tablet Take 1 tablet (100 mg total) by mouth at bedtime. 09/30/23   Fleming, Zelda W, NP  venlafaxine  XR (EFFEXOR -XR) 75 MG 24 hr  capsule TAKE 3 CAPSULES BY MOUTH EVERY MORNING WITH BREAKFAST 02/11/24   Fleming, Zelda W, NP    Allergies: Ibuprofen  and Tylenol  [acetaminophen ]    Review of Systems  Cardiovascular:  Positive for chest pain.    Updated Vital Signs BP 132/61 (BP Location: Right Arm)   Pulse (!) 52   Temp 97.8 F (36.6 C) (Oral)   Resp 19   LMP 03/19/2016   SpO2 98%   Physical Exam Vitals and nursing note reviewed.  Constitutional:      General: She is not in acute distress. HENT:     Head: Normocephalic and atraumatic.     Nose: Nose normal.     Mouth/Throat:     Mouth: Mucous membranes are moist.  Eyes:     General: No scleral icterus. Cardiovascular:     Rate and Rhythm: Normal rate and regular rhythm.     Pulses: Normal pulses.     Heart sounds: Normal heart sounds.  Pulmonary:     Effort: Pulmonary effort is normal. No respiratory distress.     Breath sounds: No wheezing.  Abdominal:     Palpations: Abdomen is soft.     Tenderness: There is no abdominal tenderness. There is no guarding or rebound.  Musculoskeletal:     Cervical back: Normal range of motion.     Right lower leg: No edema.     Left lower leg: No edema.  Skin:    General: Skin is warm and dry.     Capillary Refill: Capillary refill takes less than 2 seconds.  Neurological:     Mental Status: She is alert. Mental status is at baseline.  Psychiatric:        Mood and Affect: Mood normal.        Behavior: Behavior normal.     (all labs ordered are listed, but only abnormal results are displayed) Labs Reviewed  COMPREHENSIVE METABOLIC PANEL WITH GFR - Abnormal; Notable for the following components:      Result Value   Glucose, Bld 168 (*)    BUN 25 (*)    Creatinine, Ser 1.18 (*)    GFR, Estimated 55 (*)    All other components within normal limits  CBC WITH DIFFERENTIAL/PLATELET - Abnormal; Notable for the following components:   WBC 23.5 (*)    Neutro Abs 20.7 (*)    Abs Immature Granulocytes 0.12 (*)     All other components within normal limits  TROPONIN I (HIGH SENSITIVITY)  TROPONIN I (HIGH SENSITIVITY)    EKG: None  Radiology: DG Chest Portable 1 View Result Date: 04/18/2024 CLINICAL DATA:  Chest pain EXAM: PORTABLE CHEST 1 VIEW COMPARISON:  Chest x-ray 08/17/2013. FINDINGS: The heart size and mediastinal contours are within normal limits.  Both lungs are clear. The visualized skeletal structures are unremarkable. IMPRESSION: No active disease. Electronically Signed   By: Greig Pique M.D.   On: 04/18/2024 23:28     Procedures   Medications Ordered in the ED  alum & mag hydroxide-simeth (MAALOX/MYLANTA) 200-200-20 MG/5ML suspension 30 mL (30 mLs Oral Given 04/19/24 0824)    And  lidocaine  (XYLOCAINE ) 2 % viscous mouth solution 15 mL (15 mLs Oral Given 04/19/24 0824)                                    Medical Decision Making Amount and/or Complexity of Data Reviewed Labs: ordered. Radiology: ordered.  Risk OTC drugs. Prescription drug management.   This patient presents to the ED for concern of CP, this involves a number of treatment options, and is a complaint that carries with it a moderate to high risk of complications and morbidity. A differential diagnosis was considered for the patient's symptoms which is discussed below:   The emergent causes of chest pain include: Acute coronary syndrome, tamponade, pericarditis/myocarditis, aortic dissection, pulmonary embolism, tension pneumothorax, pneumonia, and esophageal rupture.  I do not believe the patient has an emergent cause of chest pain, other urgent/non-acute considerations include, but are not limited to: chronic angina, aortic stenosis, cardiomyopathy, mitral valve prolapse, pulmonary hypertension, aortic insufficiency, right ventricular hypertrophy, pleuritis, bronchitis, pneumothorax, tumor, gastroesophageal reflux disease (GERD), esophageal spasm, Mallory-Weiss syndrome, peptic ulcer disease, pancreatitis,  functional gastrointestinal pain, cervical or thoracic disk disease or arthritis, shoulder arthritis, costochondritis, subacromial bursitis, anxiety or panic attack, herpes zoster, breast disorders, chest wall tumors, thoracic outlet syndrome, mediastinitis.    Co morbidities: Discussed in HPI   Brief History:  Patient with past medical history significant for anxiety, depression, hypertension, 3 cigarettes a day smoker, stroke  Patient presents emergency room today with complaints of chest pain that started yesterday afternoon described as a sharp pain that began right after she took a deep drag on a cigarette she states that she coughed some immediately and felt a sharp pain.  No hemoptysis she does not feel short of breath she feels that her chest pain is mostly better now it seems that has gradually improved since this occurred she also has been having some belching with sulfur like taste and feels like she has some burning chest discomfort at times that seems to be worse when she lays down.  She denies any exertional chest pain and states she has not had any nausea or vomiting.     EMR reviewed including pt PMHx, past surgical history and past visits to ER.   See HPI for more details   Lab Tests:  I personally reviewed all laboratory work and imaging. Metabolic panel without any acute abnormality specifically kidney function within normal limits and no significant electrolyte abnormalities. CBC without leukocytosis or significant anemia.   Imaging Studies:  NAD. I personally reviewed all imaging studies and no acute abnormality found. I agree with radiology interpretation.    Cardiac Monitoring:  NA EKG non-ischemic   Medicines ordered:  I ordered medication including GI cocktail for reflux Reevaluation of the patient after these medicines showed that the patient improved I have reviewed the patients home medicines and have made adjustments as needed   Critical  Interventions:     Consults/Attending Physician      Reevaluation:  After the interventions noted above I re-evaluated patient and found that they have :  resolved   Social Determinants of Health:      Problem List / ED Course:  Patient currently asymptomatic with some chest pain yesterday that occurred right after she took a deep drag on a cigarette.  I have low suspicion for PE, myocarditis, ACS and your workup here in the emergency room has been quite reassuring she is normal vital signs and is not in any discomfort.  She has unfortunately been made to wait in the emergency department waiting room for approximately 10 hours and is requesting discharge.  I discussed further evaluation and workup but ultimately she feels well and would like to be discharged home and I think it is reasonable to monitor her symptoms and to return to the emergency room for any new or concerning symptoms.  Will add on Carafate as her symptoms do have some qualities concerning for reflux.   Dispostion:  After consideration of the diagnostic results and the patients response to treatment, I feel that the patent would benefit from outpatient follow-up.  Final diagnoses:  Atypical chest pain    ED Discharge Orders          Ordered    sucralfate (CARAFATE) 1 g tablet  3 times daily before meals,   Status:  Discontinued        04/19/24 0824    sucralfate (CARAFATE) 1 g tablet  3 times daily before meals        04/19/24 0834               Neldon Hamp RAMAN, PA 04/19/24 [id number]    Patsey Lot, MD 04/19/24 1504

## 2024-04-19 NOTE — ED Notes (Signed)
 Attempted to get vitals, patient refused.

## 2024-04-20 ENCOUNTER — Encounter: Payer: Self-pay | Admitting: Physician Assistant

## 2024-04-20 ENCOUNTER — Ambulatory Visit: Payer: Self-pay

## 2024-04-20 ENCOUNTER — Other Ambulatory Visit: Payer: Self-pay | Admitting: Pharmacist

## 2024-04-20 ENCOUNTER — Ambulatory Visit: Admitting: Physician Assistant

## 2024-04-20 VITALS — BP 127/71 | HR 56 | Temp 97.8°F | Ht 66.0 in | Wt 191.0 lb

## 2024-04-20 DIAGNOSIS — R5383 Other fatigue: Secondary | ICD-10-CM

## 2024-04-20 DIAGNOSIS — E559 Vitamin D deficiency, unspecified: Secondary | ICD-10-CM | POA: Diagnosis not present

## 2024-04-20 DIAGNOSIS — D72829 Elevated white blood cell count, unspecified: Secondary | ICD-10-CM

## 2024-04-20 DIAGNOSIS — F329 Major depressive disorder, single episode, unspecified: Secondary | ICD-10-CM

## 2024-04-20 DIAGNOSIS — H6123 Impacted cerumen, bilateral: Secondary | ICD-10-CM

## 2024-04-20 DIAGNOSIS — Z8673 Personal history of transient ischemic attack (TIA), and cerebral infarction without residual deficits: Secondary | ICD-10-CM

## 2024-04-20 DIAGNOSIS — E1165 Type 2 diabetes mellitus with hyperglycemia: Secondary | ICD-10-CM

## 2024-04-20 DIAGNOSIS — Z780 Asymptomatic menopausal state: Secondary | ICD-10-CM

## 2024-04-20 DIAGNOSIS — R001 Bradycardia, unspecified: Secondary | ICD-10-CM

## 2024-04-20 LAB — POCT GLYCOSYLATED HEMOGLOBIN (HGB A1C)
HbA1c POC (<> result, manual entry): 6.2 % (ref 4.0–5.6)
HbA1c, POC (controlled diabetic range): 6.2 % (ref 0.0–7.0)
HbA1c, POC (prediabetic range): 6.2 % (ref 5.7–6.4)
Hemoglobin A1C: 6.2 % — AB (ref 4.0–5.6)

## 2024-04-20 NOTE — Progress Notes (Signed)
 Pharmacy Quality Measure Review  This patient is appearing on a report for needing a uACR screening done. Per chart review, we attempted this in 01/2024 but she was unable to provide a sample.   I entered the order. I called the patient but she was unavailable. Left VM explaining what is needed and instructing her to come in to complete the lab.  Brandy Roberts, PharmD, JAQUELINE, CPP Clinical Pharmacist [facility] [phone]

## 2024-04-20 NOTE — Telephone Encounter (Signed)
 Called patient to schedule an appt with PCP.  Have an opening on Friday.   Left message on voicemail to return call.

## 2024-04-20 NOTE — Patient Instructions (Addendum)
 [facility] [address] [place], [place] [address] [phone] or [phone] Walk-in urgent care 24/7 for anyone  For [facility] ONLY New patient assessments and therapy walk-ins: Monday and Wednesday 8am-11am First and second Friday 1pm-5pm New patient psychiatry and medication management walk-ins: Mondays, Wednesdays, Thursdays, Fridays 8am-11am No psychiatry walk-ins on Tuesday  VISIT SUMMARY:  Today, you were seen for chest pain and fatigue. You experienced acute chest pain after smoking, which led to an emergency department visit. Your blood pressure was high, and you received treatment for the chest pain. Currently, you have no chest pain but continue to experience fatigue and body aches. You have also been experiencing fluctuations in body temperature and mood, difficulty sleeping, and frequent crying. You quit smoking two days ago. We discussed your concerns about nutrition and hydration.  YOUR PLAN:  -FATIGUE AND BODY ACHES: Your persistent fatigue and body aches may be due to dehydration, medication side effects, or a potential infection. You should increase your water intake to four bottles per day, and we rechecked your thyroid  function with a blood test. Please monitor your blood pressure and pulse at home, and follow up with cardiology on November 26th.  -MAJOR DEPRESSIVE DISORDER: Your ongoing depressive symptoms suggest that your current medication may not be effective. We discussed the potential benefit of a psychiatric evaluation for medication adjustment. You were provided information on [facility] urgent care for a psychiatric evaluation and encouraged to consider a psychiatric referral for medication management.  -HISTORY OF CEREBROVASCULAR ACCIDENT (STROKE): You had a stroke 10 years ago and are currently on blood thinners. Please continue your current blood thinner regimen and follow up with cardiology  on November 26th.  -PREDIABETES: Your recent A1c level was 6.2%, indicating prediabetes. Your blood sugar was elevated during a recent emergency department visit, but there were no acute issues. Please continue to monitor your blood sugar levels.  -VITAMIN D  DEFICIENCY: You have a vitamin D  deficiency. We rechecked your vitamin D  levels with a blood test.  -ELEVATED WHITE BLOOD CELL COUNT: Your elevated white blood cell count may be due to recent steroid use or an infection. We rechecked your white blood cell count with a blood test to determine the cause.  -CERUMEN IMPACTION, BILATERAL: You have earwax buildup in both ears. Use Debrox drops to soften the earwax and use warm water to flush your ears in the shower.  -POSTMENOPAUSAL STATE: You have been postmenopausal for 10 years, and some of your symptoms may overlap with menopausal symptoms. We discussed a potential referral to gynecology for further evaluation, but you declined. Please consider this referral if your symptoms persist.  -GENERAL HEALTH MAINTENANCE: We discussed routine health maintenance. Your recent mammogram in June was normal. Your Pap smear is due next month. Please schedule your Pap smear and continue with routine health maintenance visits.  INSTRUCTIONS:  Please follow up with cardiology on November 26th. Schedule your Pap smear for next month. Monitor your blood pressure and pulse at home. Consider a psychiatric referral for medication management if your depressive symptoms persist.  Managing Depression, Adult Depression is a mental health condition that affects your thoughts, feelings, and actions. Being diagnosed with depression can bring you relief if you did not know why you have felt or behaved a certain way. It could also leave you feeling overwhelmed. Finding ways to manage your symptoms can help you feel more positive about your future. How to manage lifestyle changes Being depressed  is difficult. Depression can  increase the level of everyday stress. Stress can make depression symptoms worse. You may believe your symptoms cannot be managed or will never improve. However, there are many things you can try to help manage your symptoms. There is hope. Managing stress  Stress is your body's reaction to life changes and events, both good and bad. Stress can add to your feelings of depression. Learning to manage your stress can help lessen your feelings of depression. Try some of the following approaches to reducing your stress (stress reduction techniques): Listen to music that you enjoy and that inspires you. Try using a meditation app or take a meditation class. Develop a practice that helps you connect with your spiritual self. Walk in nature, pray, or go to a place of worship. Practice deep breathing. To do this, inhale slowly through your nose. Pause at the top of your inhale for a few seconds and then exhale slowly, letting yourself relax. Repeat this three or four times. Practice yoga to help relax and work your muscles. Choose a stress reduction technique that works for you. These techniques take time and practice to develop. Set aside 5-15 minutes a day to do them. Therapists can offer training in these techniques. Do these things to help manage stress: Keep a journal. Know your limits. Set healthy boundaries for yourself and others, such as saying no when you think something is too much. Pay attention to how you react to certain situations. You may not be able to control everything, but you can change your reaction. Add humor to your life by watching funny movies or shows. Make time for activities that you enjoy and that relax you. Spend less time using electronics, especially at night before bed. The light from screens can make your brain think it is time to get up rather than go to bed.  Medicines Medicines, such as antidepressants, are often a part of treatment for depression. Talk with your  pharmacist or health care provider about all the medicines, supplements, and herbal products that you take, their possible side effects, and what medicines and other products are safe to take together. Make sure to report any side effects you may have to your health care provider. Relationships Your health care provider may suggest family therapy, couples therapy, or individual therapy as part of your treatment. How to recognize changes Everyone responds differently to treatment for depression. As you recover from depression, you may start to: Have more interest in doing activities. Feel more hopeful. Have more energy. Eat a more regular amount of food. Have better mental focus. It is important to recognize if your depression is not getting better or is getting worse. The symptoms you had in the beginning may return, such as: Feeling tired. Eating too much or too little. Sleeping too much or too little. Feeling restless, agitated, or hopeless. Trouble focusing or making decisions. Having unexplained aches and pains. Feeling irritable, angry, or aggressive. If you or your family members notice these symptoms coming back, let your health care provider know right away. Follow these instructions at home: Activity Try to get some form of exercise each day, such as walking. Try yoga, mindfulness, or other stress reduction techniques. Participate in group activities if you are able. Lifestyle Get enough sleep. Cut down on or stop using caffeine , tobacco, alcohol, and any other harmful substances. Eat a healthy diet that includes plenty of vegetables, fruits, whole grains, low-fat dairy products, and lean protein. Limit foods that are  high in solid fats, added sugar, or salt (sodium). General instructions Take over-the-counter and prescription medicines only as told by your health care provider. Keep all follow-up visits. It is important for your health care provider to check on your mood,  behavior, and medicines. Your health care provider may need to make changes to your treatment. Where to find support Talking to others  Friends and family members can be sources of support and guidance. Talk to trusted friends or family members about your condition. Explain your symptoms and let them know that you are working with a health care provider to treat your depression. Tell friends and family how they can help. Finances Find mental health providers that fit with your financial situation. Talk with your health care provider if you are worried about access to food, housing, or medicine. Call your insurance company to learn about your co-pays and prescription plan. Where to find more information You can find support in your area from: Anxiety and Depression Association of America (ADAA): adaa.org Mental Health America: mentalhealthamerica.net [employer] on Mental Illness: nami.org Contact a health care provider if: You stop taking your antidepressant medicines, and you have any of these symptoms: Nausea. Headache. Light-headedness. Chills and body aches. Not being able to sleep (insomnia). You or your friends and family think your depression is getting worse. Get help right away if: You have thoughts of hurting yourself or others. Get help right away if you feel like you may hurt yourself or others, or have thoughts about taking your own life. Go to your nearest emergency room or: Call 911. Call the National Suicide Prevention Lifeline at 2726466519 or 988. This is open 24 hours a day. Text the Crisis Text Line at (708)456-8338. This information is not intended to replace advice given to you by your health care provider. Make sure you discuss any questions you have with your health care provider. Document Revised: 09/24/2021 Document Reviewed: 09/24/2021 Elsevier Patient Education  2024 [employer].

## 2024-04-20 NOTE — Progress Notes (Signed)
 Established Patient Office Visit  Subjective   Patient ID: Brandy Roberts, female    DOB: 04/10/1971  Age: 53 y.o. MRN: [id number]  Chief Complaint  Patient presents with   Fatigue    She states she just feels bad fatigue, body aches. She was seen on the ED and UC recently for same and they checked her heart according to pt statement. She was told to follow up here  Discussed the use of AI scribe software for clinical note transcription with the patient, who gave verbal consent to proceed.  History of Present Illness   History of Present Illness Brandy Roberts is a 53 year old female with a history of stroke who presents with chest pain and fatigue.  Two days ago she experienced acute chest pain after smoking, which led to an emergency department visit where her blood pressure was 207/107 mmHg. She received a GI cocktail and has not had similar chest pain since. Currently, she has no chest pain. She continues to experience fatigue and body aches, with fluctuations in body temperature and mood. She has difficulty sleeping and uses trazodone  as needed, averaging 8 hours of sleep per night. She quit smoking two days ago following the chest pain incident. She takes Effexor  225 mg daily for depression and Buspar , and reports frequent crying. Her past medical history includes anemia, abnormal thyroid  function, and menopause. She is concerned about her nutrition and hydration, with low water intake of one to two bottles per day.  Adamantly denies any thoughts of self harm       04/20/2024    1:38 PM 03/29/2024    2:19 PM 12/30/2023    8:42 AM 09/30/2023    8:48 AM 09/01/2023    9:20 AM  Depression screen PHQ 2/9  Decreased Interest 1 2 1 2 3   Down, Depressed, Hopeless 1 2 1 2 3   PHQ - 2 Score 2 4 2 4 6   Altered sleeping 1 2 1 2  0  Tired, decreased energy 1 2 1 2 1   Change in appetite 1 2 1 2  0  Feeling bad or failure about yourself  1 2 1 2  0  Trouble concentrating 1 2 1 2 1    Moving slowly or fidgety/restless 1 2 1 2  0  Suicidal thoughts 1 0 0 0 0  PHQ-9 Score 9 16  8  16  8    Difficult doing work/chores  Not difficult at all Somewhat difficult  Not difficult at all     Data saved with a previous flowsheet row definition      04/20/2024    1:38 PM 03/29/2024    2:19 PM 12/30/2023    8:42 AM 09/30/2023    8:48 AM  GAD 7 : Generalized Anxiety Score  Nervous, Anxious, on Edge 1 0 1 1  Control/stop worrying 1 0 1 1  Worry too much - different things 1 0 1 2  Trouble relaxing 1 0 1 2  Restless 1 0 1 1  Easily annoyed or irritable 1 0 1 1  Afraid - awful might happen 1 0 1 1  Total GAD 7 Score 7 0 7 9  Anxiety Difficulty  Not difficult at all Somewhat difficult Not difficult at all       Past Medical History:  Diagnosis Date   Anemia    Anxiety    Aortic regurgitation    Cardiomyopathy due to hypertension [name] 10/23/2014   Chest discomfort 10/12/2018   Chronic ischemic vertebrobasilar artery  thalamic stroke    Depression    Essential hypertension 05/02/2015   Facet arthropathy 05/15/2021   Former smoker 01/24/2015   H/O noncompliance with medical treatment, presenting hazards to health    Headache 05/01/2015   Hemiparesis affecting left side as late effect of cerebrovascular accident [name] 07/11/2022   History of ischemic middle cerebral artery stroke embolic    Hyperlipidemia    Hypertension    Hypertensive heart disease    Lumbar radiculopathy 05/01/2021   Migraine 05/21/2018   Migraines    Mixed dyslipidemia 11/26/2020   Nonrheumatic aortic (valve) insufficiency    Obesity (BMI 35.0-39.9 without comorbidity) 11/26/2020   Osteoarthritis    Overweight (BMI 25.0-29.9)    Palpitations 10/12/2018   SI joint arthritis 05/15/2021   Sleep apnea    Snoring 05/02/2015   Stroke [name] 10/25/2016   Syncope and collapse 10/07/2018   Vitamin D  deficiency 05/15/2021   Social History   Socioeconomic History   Marital status: Single     Spouse name: Not on file   Number of children: 2   Years of education: 12   Highest education level: High school graduate  Occupational History   Occupation: disable   Tobacco Use   Smoking status: Former    Current packs/day: 0.00    Types: Cigarettes    Quit date: 08/31/2017    Years since quitting: 6.6   Smokeless tobacco: Never  Vaping Use   Vaping status: Never Used  Substance and Sexual Activity   Alcohol use: No    Alcohol/week: 0.0 standard drinks of alcohol   Drug use: Yes    Types: Marijuana   Sexual activity: Not on file  Other Topics Concern   Not on file  Social History Narrative   Patient lives with her uncle in a one story home.  Has 2 sons.  Currently on disability.  Education: high school.   Drinks 1-2 sodas a week       Social Drivers of Corporate Investment Banker Strain: Low Risk  (09/01/2023)   Overall Financial Resource Strain (CARDIA)    Difficulty of Paying Living Expenses: Not hard at all  Food Insecurity: Food Insecurity Present (04/20/2024)   Hunger Vital Sign    Worried About Running Out of Food in the Last Year: Sometimes true    Ran Out of Food in the Last Year: Sometimes true  Transportation Needs: No Transportation Needs (04/20/2024)   PRAPARE - Administrator, Civil Service (Medical): No    Lack of Transportation (Non-Medical): No  Physical Activity: Inactive (09/01/2023)   Exercise Vital Sign    Days of Exercise per Week: 0 days    Minutes of Exercise per Session: 0 min  Stress: No Stress Concern Present (09/01/2023)   [employer] of Occupational Health - Occupational Stress Questionnaire    Feeling of Stress : Not at all  Social Connections: Socially Isolated (09/01/2023)   Social Connection and Isolation Panel    Frequency of Communication with Friends and Family: Twice a week    Frequency of Social Gatherings with Friends and Family: Twice a week    Attends Religious Services: Never    Database Administrator or  Organizations: No    Attends Banker Meetings: Never    Marital Status: Never married  Intimate Partner Violence: Not At Risk (04/20/2024)   Humiliation, Afraid, Rape, and Kick questionnaire    Fear of Current or Ex-Partner: No    Emotionally Abused:  No    Physically Abused: No    Sexually Abused: No   Family History  Problem Relation Age of Onset   Cancer Mother        type unknown   Hypertension Mother    Hypertension Sister    Diabetes Sister    Cancer Maternal Aunt        4 aunts died of cancer types unknown   Allergies  Allergen Reactions   Ibuprofen  Other (See Comments)    Can not take with current medications .   Tylenol  [Acetaminophen ] Other (See Comments)    Pt stated tylenol  gives her extreme headache    Review of Systems  Constitutional:  Negative for chills and fever.  HENT:  Negative for congestion, ear discharge and ear pain.   Eyes: Negative.   Respiratory:  Negative for shortness of breath.   Cardiovascular:  Negative for chest pain and palpitations.  Gastrointestinal:  Negative for heartburn.  Genitourinary:  Negative for dysuria and hematuria.  Musculoskeletal:  Positive for myalgias.  Skin: Negative.   Neurological:  Negative for dizziness and headaches.  Endo/Heme/Allergies: Negative.   Psychiatric/Behavioral:  Positive for depression. Negative for suicidal ideas.       Objective:     BP 127/71 (BP Location: Left Arm, Patient Position: Sitting, Cuff Size: Large)   Pulse (!) 56   Temp 97.8 F (36.6 C)   Ht 5' 6 (1.676 m)   Wt 191 lb (86.6 kg)   LMP 03/19/2016   SpO2 97%   BMI 30.83 kg/m  BP Readings from Last 3 Encounters:  04/20/24 127/71  04/19/24 132/61  04/17/24 114/71   Wt Readings from Last 3 Encounters:  04/20/24 191 lb (86.6 kg)  03/29/24 195 lb 12.8 oz (88.8 kg)  12/30/23 192 lb 12.8 oz (87.5 kg)    Physical Exam Vitals and nursing note reviewed.  Constitutional:      Appearance: Normal appearance.   HENT:     Head: Normocephalic and atraumatic.     Ears:     Comments: Cerumen debris  on both tympanic membranes, unable to visualize    Nose: Nose normal.     Mouth/Throat:     Mouth: Mucous membranes are moist.     Pharynx: Oropharynx is clear.  Eyes:     Extraocular Movements: Extraocular movements intact.     Conjunctiva/sclera: Conjunctivae normal.     Pupils: Pupils are equal, round, and reactive to light.  Cardiovascular:     Rate and Rhythm: Regular rhythm. Bradycardia present.     Heart sounds: Normal heart sounds.  Pulmonary:     Effort: Pulmonary effort is normal.     Breath sounds: Normal breath sounds.  Musculoskeletal:        General: Normal range of motion.     Cervical back: Normal range of motion and neck supple.  Skin:    General: Skin is warm and dry.  Neurological:     General: No focal deficit present.     Mental Status: She is alert and oriented to person, place, and time.  Psychiatric:        Attention and Perception: Attention normal.        Mood and Affect: Affect is tearful.        Speech: Speech normal.        Behavior: Behavior normal.        Thought Content: Thought content normal. Thought content does not include homicidal or suicidal ideation.  Cognition and Memory: Cognition normal.        Judgment: Judgment normal.        Assessment & Plan:   Problem List Items Addressed This Visit       Other   Vitamin D  deficiency   Relevant Orders   Vitamin D , 25-hydroxy   Other Visit Diagnoses       Type 2 diabetes mellitus with hyperglycemia, without long-term current use of insulin [name]    -  Primary   Relevant Orders   HgB A1c (Completed)   Microalbumin / creatinine urine ratio     Fatigue, unspecified type       Relevant Orders   Thyroid  Panel With TSH     Leukocytosis, unspecified type       Relevant Orders   CBC with Differential/Platelet     Cerumen debris on tympanic membrane, bilateral         Bradycardia           Assessment and Plan Fatigue and body aches Persistent fatigue and body aches with differential including dehydration, medication side effects, and potential infection. Elevated WBC possibly due to steroid use or infection. Dehydration noted. Cardiac causes ruled out. Thyroid  function to be re-evaluated. - Increase water intake to four bottles per day. - Rechecked thyroid  function with blood test. - Encouraged monitoring of blood pressure and pulse at home. - Encouraged follow-up with cardiology on November 26th.  The patient was given clear instructions to go to ER or return to medical center if symptoms don't improve, worsen or new problems develop. The patient verbalized understanding. The patient was told to call to get lab results if they haven't heard anything in the next week.    Major depressive disorder Ongoing depressive symptoms despite current Effexor  regimen. Current dose may not be effective. Discussed potential benefit of psychiatric evaluation for medication adjustment. (Declined) - Provided information on [facility] urgent care for psychiatric evaluation. - Encouraged consideration of psychiatric referral for medication management.  History of cerebrovascular accident (stroke) Stroke 10 years ago with ongoing effects. Currently on blood thinners. - Continue current blood thinner regimen. - Follow up with cardiology on November 26th.  Type 2 DM Recent A1c of 6.2%. Blood sugar was elevated during recent emergency department visit, but no acute issues noted. - Continue monitoring blood sugar levels.  Vitamin D  deficiency Vitamin D  deficiency. Current status to be re-evaluated. - Rechecked vitamin D  levels with blood test.  Elevated white blood cell count - Rechecked white blood cell count with blood test.  Cerumen impaction, bilateral Bilateral cerumen impaction noted. - Use Debrox drops to soften earwax. - Use warm water to flush ears in the  shower.  Postmenopausal state Postmenopausal for 10 years. Symptoms may overlap with menopausal symptoms. No current hormone replacement therapy. Discussed potential referral to gynecology for further evaluation, but she declined. - Consider referral to gynecology for evaluation of menopausal symptoms.   General Health Maintenance Routine health maintenance discussed. Recent mammogram in June was normal. Pap smear due next month. - Encouraged scheduling of Pap smear.  - Encouraged routine health maintenance visits.   Return if symptoms worsen or fail to improve.    Kirk RAMAN Mayers, PA-C

## 2024-04-20 NOTE — Telephone Encounter (Signed)
 FYI Only or Action Required?: Action required by provider: request for appointment and patient would like to be seen in the office-did recommend patient to the mobile medicine bus today.  Patient was last seen in primary care on 03/29/2024 by Theotis Haze ORN, NP.  Called Nurse Triage reporting Palpitations.  Symptoms began several days ago.  Interventions attempted: Rest, hydration, or home remedies.  Symptoms are: unchanged.  Triage Disposition: See PCP When Office is Open (Within 3 Days)  Patient/caregiver understands and will follow disposition?: Yes  Copied from CRM #[id number]. Topic: Clinical - Red Word Triage >> Apr 20, 2024 10:21 AM Larissa RAMAN wrote: [facility] that prompted transfer to Nurse Triage: heart palpitations- worsening. Patient states she has been to Er x 2 Reason for Disposition  [1] Palpitations AND [2] no improvement after using Care Advice  Answer Assessment - Initial Assessment Questions Patient reports palpitations for the last couple of days. Denies any symptoms along with the palpitations. Patient is asking to be seen. Information for mobile medicine bus given to patient. Patient was scheduled for a lab appointment tomorrow for a urine test that has been ordered.   1. DESCRIPTION: Please describe your heart rate or heartbeat that you are having (e.g., fast/slow, regular/irregular, skipped or extra beats, palpitations)     palpitations 2. ONSET: When did it start? (e.g., minutes, hours, days)      A couple of days  3. DURATION: How long does it last (e.g., seconds, minutes, hours)     A few minutes 4. PATTERN Does it come and go, or has it been constant since it started?  Does it get worse with exertion?   Are you feeling it now?     Comes and goes 5. TAP: Using your hand, can you tap out what you are feeling on a chair or table in front of you, so that I can hear? Note: Not all patients can do this.       NA 6. HEART RATE: Can you tell me your  heart rate? How many beats in 15 seconds?  Note: Not all patients can do this.       NA 7. RECURRENT SYMPTOM: Have you ever had this before? If Yes, ask: When was the last time? and What happened that time?      yes 8. CAUSE: What do you think is causing the palpitations?     Unsure of what is causing them 9. CARDIAC HISTORY: Do you have any history of heart disease? (e.g., heart attack, angina, bypass surgery, angioplasty, arrhythmia)      yes 10. OTHER SYMPTOMS: Do you have any other symptoms? (e.g., dizziness, chest pain, sweating, difficulty breathing)       no  Protocols used: Heart Rate and Heartbeat Questions-A-AH

## 2024-04-21 ENCOUNTER — Ambulatory Visit: Payer: Self-pay | Admitting: Physician Assistant

## 2024-04-21 ENCOUNTER — Ambulatory Visit (HOSPITAL_COMMUNITY)
Admission: EM | Admit: 2024-04-21 | Discharge: 2024-04-21 | Disposition: A | Discharge status: Home / Self Care | Attending: Family Medicine | Admitting: Family Medicine

## 2024-04-21 ENCOUNTER — Ambulatory Visit

## 2024-04-21 DIAGNOSIS — F32A Depression, unspecified: Secondary | ICD-10-CM | POA: Diagnosis not present

## 2024-04-21 DIAGNOSIS — H60391 Other infective otitis externa, right ear: Secondary | ICD-10-CM

## 2024-04-21 DIAGNOSIS — E559 Vitamin D deficiency, unspecified: Secondary | ICD-10-CM

## 2024-04-21 LAB — CBC WITH DIFFERENTIAL/PLATELET
Basophils Absolute: 0.1 x10E3/uL (ref 0.0–0.2)
Basos: 1 %
EOS (ABSOLUTE): 0.3 x10E3/uL (ref 0.0–0.4)
Eos: 3 %
Hematocrit: 40.8 % (ref 34.0–46.6)
Hemoglobin: 12.7 g/dL (ref 11.1–15.9)
Immature Grans (Abs): 0 x10E3/uL (ref 0.0–0.1)
Immature Granulocytes: 0 %
Lymphocytes Absolute: 3.7 x10E3/uL — ABNORMAL HIGH (ref 0.7–3.1)
Lymphs: 37 %
MCH: 27.6 pg (ref 26.6–33.0)
MCHC: 31.1 g/dL — ABNORMAL LOW (ref 31.5–35.7)
MCV: 89 fL (ref 79–97)
Monocytes Absolute: 0.9 x10E3/uL (ref 0.1–0.9)
Monocytes: 9 %
Neutrophils Absolute: 5 x10E3/uL (ref 1.4–7.0)
Neutrophils: 50 %
Platelets: 246 x10E3/uL (ref 150–450)
RBC: 4.6 x10E6/uL (ref 3.77–5.28)
RDW: 13.6 % (ref 11.7–15.4)
WBC: 9.8 x10E3/uL (ref 3.4–10.8)

## 2024-04-21 LAB — THYROID PANEL WITH TSH
Free Thyroxine Index: 2.3 (ref 1.2–4.9)
T3 Uptake Ratio: 29 % (ref 24–39)
T4, Total: 7.9 ug/dL (ref 4.5–12.0)
TSH: 4.09 u[IU]/mL (ref 0.450–4.500)

## 2024-04-21 LAB — MICROALBUMIN / CREATININE URINE RATIO
Creatinine, Urine: 143.6 mg/dL
Microalb/Creat Ratio: 6 mg/g{creat} (ref 0–29)
Microalbumin, Urine: 8.1 ug/mL

## 2024-04-21 LAB — VITAMIN D 25 HYDROXY (VIT D DEFICIENCY, FRACTURES): Vit D, 25-Hydroxy: 16.1 ng/mL — ABNORMAL LOW (ref 30.0–100.0)

## 2024-04-21 MED ORDER — CIPROFLOXACIN-DEXAMETHASONE 0.3-0.1 % OT SUSP: 4.0000 [drp] | Freq: Two times a day (BID) | OTIC | 0 refills | Status: AC

## 2024-04-21 MED ORDER — VITAMIN D (ERGOCALCIFEROL) 1.25 MG (50000 UNIT) PO CAPS: 50000.0000 [IU] | ORAL_CAPSULE | ORAL | 2 refills | Status: AC

## 2024-04-21 NOTE — ED Triage Notes (Signed)
 C/O right ear pain x several days; states was seen for HA at [facility] 11/16 and was having right ear pain at that time, then was seen in ED the following day for chest pain. States she contiinues with severe right ear pain. STates she is not taking anything to help with ear pain I need something.

## 2024-04-22 NOTE — Progress Notes (Unsigned)
 NEUROLOGY FOLLOW UP OFFICE NOTE  Brandy Roberts [id number]  Assessment/Plan:   Migraine without aura, without status migrainosus, not intractable Right sided ear infection Depression, worsening      1.  Migraine prophylaxis:  Start Nurtec every other day.   2.  Will taper off venlafaxine  slowly over 3 months in order to replace with an alternative medication to more effectively treat her depression.  I will defer to her PCP whom she is seeing tomorrow morning.   3.  Migraine rescue:  will provide samples of Ubrelvy 100mg .   Triptans contraindicated, does not respond to Tylenol  and would rather not use NSAID if possible due to already taking Plavix . 4.  Limit use of pain relievers to no more than 9 days out of the month to prevent risk of rebound or medication-overuse headache. 5.  Keep headache diary 6.  Follow up in 7 months.  Total time spent in chart and face to face with patient:  49 minutes    Subjective:  Brandy Roberts is a 53 year old right-handed woman with aortic insufficiency, hypertension, hyperlipidemia and history of stroke who follows up for migraines.   UPDATE: Migraines: As she was unable to get flurbiprofen  from her pharmacy, she was provided samples of Ubrelvy to try, but doesn't remember trying it. Usually,  Intensity: Moderate Duration: Not needed. Frequency: none in past year  She developed new onset right ear pain radiating into the right side of headache and neck.  She was seen in Urgent Care several times.  She was found to have a right ear infection and started on antibiotics.  She was also seen in the ED for atypical chest pain.  Cardiac testing negative.  She does report increased depression.    Current NSAIDS: none Current analgesics: None Current triptans: None Current ergotamine: None Current anti-emetic:  Reglan  10 mg Current muscle relaxants: None Current anti-anxiolytic: None Current sleep aide: trazodone  Current Antihypertensive  medications: metoprolol , amlodipine , lisinopril , amlodipine  10 mg, hydralazine , hydrochlorothiazide , spironolactone  Current Antidepressant medications: Venlafaxine  XR 225 mg (uncertain) Current Anticonvulsant medications:none Current anti-CGRP: none Current Vitamins/Herbal/Supplements: None Current Antihistamines/Decongestants: None Other therapy: None Other medication:  buspirone , trazodone , Plavix , atorvastatin      Caffeine : Coffee, sweet tea, not daily Alcohol: No Smoker: Cigarettes Diet: Hydrates Exercise: No Depression: Yes; Anxiety: Yes Other pain: Chronic right knee pain Sleep hygiene: Sometimes tosses and turns     HISTORY: Migraines: Onset: Migraines since she was young, but worse since her stroke in May 2016. Location:  holocephalic Quality:  pounding Initial Intensity:  10/10 Aura:  no Prodrome:  no Postdrome:  no Associated symptoms: Photophobia, phonophobia.  Rarely nausea.  She has not had any new worse headache of her life, waking up from sleep Initial Duration:  2 days (but Fioricet and ibuprofen  lowers intensity down to 2-3/10) Initial Frequency:  Once or twice a month Frequency of abortive medication: only as needed Triggers: None Relieving factors:  Laying down.  Butalbital , ibuprofen .  Tramadol  helped but she was told not to take it. Activity:  aggravates   Past NSAIDS:  Ibuprofen , naproxen, flurbiprofen  (effective) Past analgesics:  Tylenol , Excedrin, Tramadol  (effective but GI upset), Fioricet Past abortive triptans:  no Past muscle relaxants:  no Past anti-emetic:  Zofran  Past antihypertensive medications:  no Past antidepressant medications:  fluoxetine Past anticonvulsant medications: topiramate , gabapentin , pregablin Past CGRP inhibitor:  Emgality  (uncomfortable with injections), Qulipta  (weight loss) Past vitamins/Herbal/Supplements:  no Other past therapies:  no   Family history of headache:  Sister.  Stroke: She had a stroke in May  2016 with left sided weakness.  MRI of brain from 10/06/14 was personally reviewed and revealed small acute lacunar infarcts in the right thalamus and right corona radiata, as well as chronic lacunar infarcts in the left hemisphere.  CTA of head and neck revealed diffuse bilateral petrous and cavernous carotid stenosis.  Cardiac source of embolus was not discovered.     She was admitted to [facility] from 10/24/16 to 10/26/16 for stroke-like event.  She suddenly developed left sided weakness and slurred speech with associated headache.  Blood pressure in ED was 120/66.  CT of head was personally reviewed and negative for acute abnormality.  She had refused tPA.  MRI of brain was personally reviewed and revealed no acute stroke or bleed.  MRA of head revealed no significant intracranial stenosis or occlusion.  LDL was 84.  Hgb A1c was 5.7.  As per neurology evaluation, her deficits appeared to be non-organic, with unusual speech pattern and giveaway weakness.  Somatization was suspected.  2D echo from 11/06/16 demonstrated normal LV EF of 65-70% with no cardiac source of emboli.  Atorvastatin  was increased from 10mg  to 80mg  daily.  Differential includes TIA vs hemiplegic migraine vs somatization.   Syncope: On 09/21/18, she presented to the ED at [facility] for a 5 day intractable holocephalic throbbing headache.  She reports that when EMS was leading her to the ambulance from her bedroom, she passed out for 20-30 seconds.  She described sensation of lightheadedness, like she was going to pass out, as well as tunnel vision and palpitations.  She was afebrile.  CT of head revealed her old left basal ganglia infarct but no acute abnormality.  She was treated with Reglan  and Benadryl  and headache reportedly resolved.  On 09/26/18, she was sitting on her barstool when she felt lightheaded again.  She noted palpitations as well.  She got up and started walking to her bedroom when she noted tunnel vision and passed  out.  She woke up on the floor and bruised her back and leg.  She thinks she may have been unconsciousness for 20 to 30 minute.  There were no witnesses.  She noted urinary incontinence but did not bite her tongue.  She did not have a headache.  She denies change in medications over the past week.  She denies fever or illness.  Due to episodes of recurrent syncopal events, she underwent workup.  MRI of brain with and without contrast on 10/11/18 was personally reviewed and demonstrated stable chronic infarcts but no acute intracranial abnormality.  EEG performed on 10/21/18 was normal. No recurrent spells.   Falls: In 2019, she endorsed increased problems with balance and falls.  She stated that they were associated with headache and worsening of her left-sided weakness.  MRI of the brain without contrast from 11/13/2017 was personally reviewed and demonstrated no new intracranial abnormalities.  PAST MEDICAL HISTORY: Past Medical History:  Diagnosis Date   Anemia    Anxiety    Aortic regurgitation    Cardiomyopathy due to hypertension [name] 10/23/2014   Chest discomfort 10/12/2018   Chronic ischemic vertebrobasilar artery thalamic stroke    Cigarette smoker 02/18/2023   Depression    Essential hypertension 05/02/2015   Facet arthropathy 05/15/2021   Former smoker 01/24/2015   H/O noncompliance with medical treatment, presenting hazards to health    Headache 05/01/2015   Hemiparesis affecting left side as late effect of cerebrovascular accident [name] 07/11/2022  History of ischemic middle cerebral artery stroke embolic    Hyperlipidemia    Hypertension    Hypertensive heart disease    Lumbar radiculopathy 05/01/2021   Migraine 05/21/2018   Migraines    Mixed dyslipidemia 11/26/2020   Nonrheumatic aortic (valve) insufficiency    Obesity (BMI 35.0-39.9 without comorbidity) 11/26/2020   Osteoarthritis    Overweight (BMI 25.0-29.9)    Palpitations 10/12/2018   SI joint arthritis  05/15/2021   Sleep apnea    Snoring 05/02/2015   Stroke [name] 10/25/2016   Syncope and collapse 10/07/2018   Vitamin D  deficiency 05/15/2021    MEDICATIONS: Current Outpatient Medications on File Prior to Visit  Medication Sig Dispense Refill   amLODipine  (NORVASC ) 10 MG tablet Take 1 tablet (10 mg total) by mouth daily. 90 tablet 1   Atogepant  (QULIPTA ) 60 MG TABS Take 1 tablet (60 mg total) by mouth daily. 30 tablet 11   atorvastatin  (LIPITOR ) 80 MG tablet TAKE 1 TABLET BY MOUTH EVERY DAY AT 6PM FOR CHOLESTEROL 90 tablet 1   busPIRone  (BUSPAR ) 10 MG tablet Take 1 tablet (10 mg total) by mouth 3 (three) times daily. 90 tablet 3   chlorhexidine  (HIBICLENS ) 4 % external liquid Apply topically daily as needed. 1000 mL 1   ciprofloxacin -dexamethasone  (CIPRODEX ) OTIC suspension Place 4 drops into the right ear 2 (two) times daily. 7.5 mL 0   clopidogrel  (PLAVIX ) 75 MG tablet Take 1 tablet (75 mg total) by mouth daily. 90 tablet 3   flurbiprofen  (ANSAID ) 100 MG tablet TAKE 1 TABLET BY MOUTH EVERY 8 HOURS AS NEEDED (NOT MORE THAN 3 TABLETS A DAY) 30 tablet 5   hydrALAZINE  (APRESOLINE ) 50 MG tablet Take 1 tablet (50 mg total) by mouth 3 (three) times daily. 270 tablet 1   hydrochlorothiazide  (MICROZIDE ) 12.5 MG capsule TAKE 1 CAPSULE BY MOUTH DAILY 90 capsule 1   hydrOXYzine (ATARAX/VISTARIL) 50 MG tablet Take 50 mg by mouth 3 (three) times daily as needed for anxiety or sleep.     lisinopril  (ZESTRIL ) 10 MG tablet TAKE 1 TABLET BY MOUTH EVERY DAY 90 tablet 1   metoCLOPramide  (REGLAN ) 10 MG tablet Take 10 mg by mouth every 6 (six) hours as needed for nausea or headache.     metoprolol  tartrate (LOPRESSOR ) 25 MG tablet Take 1 tablet (25 mg total) by mouth 2 (two) times daily. 180 tablet 1   Misc. Devices MISC Please provide patient with extra-large sized incontinence briefs N39.3 1 each 0   mupirocin  ointment (BACTROBAN ) 2 % Apply 1 Application topically 2 (two) times daily. Apply to back of head  200 g 3   nitroGLYCERIN  (NITROSTAT ) 0.4 MG SL tablet Place 1 tablet (0.4 mg total) under the tongue every 5 (five) minutes as needed for chest pain. 30 tablet 1   ondansetron  (ZOFRAN -ODT) 4 MG disintegrating tablet Take 1 tablet (4 mg total) by mouth every 8 (eight) hours as needed for nausea or vomiting. 20 tablet 3   pantoprazole  (PROTONIX ) 40 MG tablet Take 1 tablet (40 mg total) by mouth 2 (two) times daily. 180 tablet 1   spironolactone  (ALDACTONE ) 25 MG tablet Take 1 tablet (25 mg total) by mouth daily. 90 tablet 1   sucralfate  (CARAFATE ) 1 g tablet Take 1 tablet (1 g total) by mouth 3 (three) times daily before meals. 90 tablet 0   traZODone  (DESYREL ) 100 MG tablet Take 1 tablet (100 mg total) by mouth at bedtime. 90 tablet 1   venlafaxine  XR (EFFEXOR -XR) 75 MG  24 hr capsule TAKE 3 CAPSULES BY MOUTH EVERY MORNING WITH BREAKFAST 270 capsule 0   Vitamin D , Ergocalciferol , (DRISDOL ) 1.25 MG (50000 UNIT) CAPS capsule Take 1 capsule (50,000 Units total) by mouth every 7 (seven) days. 4 capsule 2   No current facility-administered medications on file prior to visit.    ALLERGIES: Allergies  Allergen Reactions   Ibuprofen  Other (See Comments)    Can not take with current medications .   Tylenol  [Acetaminophen ] Other (See Comments)    Pt stated tylenol  gives her extreme headache    FAMILY HISTORY: Family History  Problem Relation Age of Onset   Cancer Mother        type unknown   Hypertension Mother    Hypertension Sister    Diabetes Sister    Cancer Maternal Aunt        4 aunts died of cancer types unknown      Objective:  Blood pressure 111/61, pulse 65, height 5' 6 (1.676 m), weight 195 lb (88.5 kg), last menstrual period 03/19/2016, SpO2 97%. General: No acute distress.  Patient appears well-groomed.   Head:  Normocephalic/atraumatic Eyes:  Fundi examined but not visualized Neck: supple, no paraspinal tenderness, full range of motion Heart:  Regular rate and  rhythm Neurological Exam:  alert and oriented.  Speech fluent and not dysarthric.  Language intact.  Decreased left facial sensation.  Otherwise, CN II-XII intact.  Muscle strength 5-/5 left deltoid and biceps, 4+/5 left triceps, hand grip and left lower extremity.  5/5 right upper and lower extremities.  Mildly reduced light touch sensation on left upper and lower extremities.  Deep tendon reflexes 3+ throughout, slightly more brisk on the left.  Finger to nose testing with left dysmetria.  Left hemiparetic gait.  Romberg with sway  ambulates with cane.   Juliene Dunnings, DO  CC: Haze Servant, NP

## 2024-04-25 ENCOUNTER — Encounter: Payer: Self-pay | Admitting: Neurology

## 2024-04-25 ENCOUNTER — Other Ambulatory Visit (HOSPITAL_COMMUNITY): Payer: Self-pay

## 2024-04-25 ENCOUNTER — Ambulatory Visit (INDEPENDENT_AMBULATORY_CARE_PROVIDER_SITE_OTHER): Admitting: Neurology

## 2024-04-25 ENCOUNTER — Telehealth: Payer: Self-pay

## 2024-04-25 ENCOUNTER — Telehealth: Payer: Self-pay | Admitting: Pharmacy Technician

## 2024-04-25 VITALS — BP 111/61 | HR 65 | Ht 66.0 in | Wt 195.0 lb

## 2024-04-25 DIAGNOSIS — G43009 Migraine without aura, not intractable, without status migrainosus: Secondary | ICD-10-CM | POA: Diagnosis not present

## 2024-04-25 MED ORDER — NURTEC 75 MG PO TBDP: 1.0000 | ORAL_TABLET | ORAL | 5 refills | Status: AC

## 2024-04-25 NOTE — Telephone Encounter (Signed)
 PA needed for Nurctec preventive

## 2024-04-25 NOTE — Telephone Encounter (Signed)
 PA has been submitted, and telephone encounter has been created. Please see telephone encounter dated 11.24.25.

## 2024-04-25 NOTE — Telephone Encounter (Signed)
 Pharmacy Patient Advocate Encounter   Received notification from Pt Calls Messages that prior authorization for NURTEC 75MG  is required/requested.   Insurance verification completed.   The patient is insured through [facility].   Per test claim: PA required; PA submitted to above mentioned insurance via Latent Key/confirmation #/EOC [id number] Status is pending

## 2024-04-25 NOTE — Progress Notes (Signed)
 LMOM

## 2024-04-25 NOTE — Progress Notes (Signed)
 Medication Samples have been provided to the patient.  Drug name: [name]       Strength: 100 mg        Qty: 4  LOT: [id number]  Exp.Date: 6/27  Dosing instructions: as needed  The patient has been instructed regarding the correct time, dose, and frequency of taking this medication, including desired effects and most common side effects.   Brandy Roberts 2:01 PM 04/25/2024

## 2024-04-25 NOTE — Patient Instructions (Signed)
 Plan to start Nurtec 1 table EVERY OTHER DAY.  If no improvement in 3 months, contact me Will need to discontinue VENLAFAXINE , but we need to get off of it very slowly: Take 2 pills daily for 4 weeks Then 1 pill daily for 4 weeks Then 1 pill every other day for 4 weeks Then STOP Follow up with Haze Servant tomorrow to see which alternative antidepressant would be best for you

## 2024-04-26 ENCOUNTER — Ambulatory Visit: Attending: Nurse Practitioner | Admitting: Nurse Practitioner

## 2024-04-26 ENCOUNTER — Encounter: Payer: Self-pay | Admitting: Nurse Practitioner

## 2024-04-26 ENCOUNTER — Ambulatory Visit: Admitting: Neurology

## 2024-04-26 VITALS — BP 112/74 | HR 74 | Resp 19 | Ht 66.0 in | Wt 195.2 lb

## 2024-04-26 DIAGNOSIS — F419 Anxiety disorder, unspecified: Secondary | ICD-10-CM | POA: Diagnosis not present

## 2024-04-26 DIAGNOSIS — F32A Depression, unspecified: Secondary | ICD-10-CM | POA: Diagnosis not present

## 2024-04-26 DIAGNOSIS — N951 Menopausal and female climacteric states: Secondary | ICD-10-CM

## 2024-04-26 MED ORDER — DULOXETINE HCL 20 MG PO CPEP: 20.0000 mg | ORAL_CAPSULE | Freq: Every day | ORAL | 1 refills | Status: AC

## 2024-04-26 MED ORDER — BUPROPION HCL ER (XL) 150 MG PO TB24: 150.0000 mg | ORAL_TABLET | Freq: Every day | ORAL | 1 refills | Status: DC

## 2024-04-26 NOTE — ED Provider Notes (Signed)
 [facility]   [id number] 04/21/24 Arrival Time: 1744  ASSESSMENT & PLAN:  1. Other infective acute otitis externa of right ear   2. Depression, unspecified depression type    Denies SI. Has info for behavioral health. Will schedule prompt f/u with PCP. Agrees to ED eval should her symptoms worsen in any way.  For OE, begin: Meds ordered this encounter  Medications   ciprofloxacin -dexamethasone  (CIPRODEX ) OTIC suspension    Sig: Place 4 drops into the right ear 2 (two) times daily.    Dispense:  7.5 mL    Refill:  0     Follow-up Information     Schedule an appointment as soon as possible for a visit  with Theotis Haze ORN, NP.   Specialty: Nurse Practitioner Why: For follow up as soon as possible to discuss your depression. Contact information: [address] [place] [address] [phone]         Go to  [facility].   Specialty: Urgent Care Why: If your feelings of being depresed worsen in any way. Contact information: [address]   [address] [phone]                Reviewed expectations re: course of current medical issues. Questions answered. Outlined signs and symptoms indicating need for more acute intervention. Understanding verbalized. After Visit Summary given.   SUBJECTIVE: History from: Patient. Brandy Roberts is a 53 y.o. female. C/O right ear pain x several days; states was seen for HA at [facility] 11/16 and was having right ear pain at that time, then was seen in ED the following day for chest pain. States she contiinues with severe right ear pain. STates she is not taking anything to help with ear pain I need something. Denies: fever. Also notes feeling depressed. Does take medications as directed. Denies SI.  OBJECTIVE:  Vitals:   04/21/24 1936  BP: 114/60  Pulse: 60  Resp: 18  Temp: 97.9 F (36.6 C)  TempSrc: Oral  SpO2: 97%    General  appearance: alert; no distress Eyes: PERRLA; EOMI; conjunctiva normal HENT: [place]; AT; without nasal congestion; R EAC with inflammation and erythema Neck: supple  Lungs: speaks full sentences without difficulty; unlabored Extremities: no edema Skin: warm and dry Neurologic: normal gait Psychological: alert and cooperative; normal mood and affect; is tearful at times  Labs:  Labs Reviewed - No data to display  Imaging: No results found.  Allergies  Allergen Reactions   Ibuprofen  Other (See Comments)    Can not take with current medications .   Tylenol  [Acetaminophen ] Other (See Comments)    Pt stated tylenol  gives her extreme headache    Past Medical History:  Diagnosis Date   Anemia    Anxiety    Aortic regurgitation    Cardiomyopathy due to hypertension [name] 10/23/2014   Chest discomfort 10/12/2018   Chronic ischemic vertebrobasilar artery thalamic stroke    Cigarette smoker 02/18/2023   Depression    Essential hypertension 05/02/2015   Facet arthropathy 05/15/2021   Former smoker 01/24/2015   H/O noncompliance with medical treatment, presenting hazards to health    Headache 05/01/2015   Hemiparesis affecting left side as late effect of cerebrovascular accident [name] 07/11/2022   History of ischemic middle cerebral artery stroke embolic    Hyperlipidemia    Hypertension    Hypertensive heart disease    Lumbar radiculopathy 05/01/2021   Migraine 05/21/2018   Migraines  Mixed dyslipidemia 11/26/2020   Nonrheumatic aortic (valve) insufficiency    Obesity (BMI 35.0-39.9 without comorbidity) 11/26/2020   Osteoarthritis    Overweight (BMI 25.0-29.9)    Palpitations 10/12/2018   SI joint arthritis 05/15/2021   Sleep apnea    Snoring 05/02/2015   Stroke [name] 10/25/2016   Syncope and collapse 10/07/2018   Vitamin D  deficiency 05/15/2021   Social History   Socioeconomic History   Marital status: Single    Spouse name: Not on file   Number of children: 2    Years of education: 12   Highest education level: High school graduate  Occupational History   Occupation: disable   Tobacco Use   Smoking status: Former    Current packs/day: 0.00    Types: Cigarettes    Quit date: 08/31/2017    Years since quitting: 6.6   Smokeless tobacco: Never  Vaping Use   Vaping status: Never Used  Substance and Sexual Activity   Alcohol use: No    Alcohol/week: 0.0 standard drinks of alcohol   Drug use: Yes    Types: Marijuana   Sexual activity: Not on file  Other Topics Concern   Not on file  Social History Narrative   Patient lives with her uncle in a one story home.  Has 2 sons.  Currently on disability.  Education: high school.   Drinks 1-2 sodas a week       Social Drivers of Corporate Investment Banker Strain: Low Risk  (09/01/2023)   Overall Financial Resource Strain (CARDIA)    Difficulty of Paying Living Expenses: Not hard at all  Food Insecurity: Food Insecurity Present (04/20/2024)   Hunger Vital Sign    Worried About Running Out of Food in the Last Year: Sometimes true    Ran Out of Food in the Last Year: Sometimes true  Transportation Needs: No Transportation Needs (04/20/2024)   PRAPARE - Administrator, Civil Service (Medical): No    Lack of Transportation (Non-Medical): No  Physical Activity: Inactive (09/01/2023)   Exercise Vital Sign    Days of Exercise per Week: 0 days    Minutes of Exercise per Session: 0 min  Stress: No Stress Concern Present (09/01/2023)   [employer] of Occupational Health - Occupational Stress Questionnaire    Feeling of Stress : Not at all  Social Connections: Socially Isolated (09/01/2023)   Social Connection and Isolation Panel    Frequency of Communication with Friends and Family: Twice a week    Frequency of Social Gatherings with Friends and Family: Twice a week    Attends Religious Services: Never    Database Administrator or Organizations: No    Attends Banker Meetings:  Never    Marital Status: Never married  Intimate Partner Violence: Not At Risk (04/20/2024)   Humiliation, Afraid, Rape, and Kick questionnaire    Fear of Current or Ex-Partner: No    Emotionally Abused: No    Physically Abused: No    Sexually Abused: No   Family History  Problem Relation Age of Onset   Cancer Mother        type unknown   Hypertension Mother    Hypertension Sister    Diabetes Sister    Cancer Maternal Aunt        4 aunts died of cancer types unknown   Past Surgical History:  Procedure Laterality Date   CESAREAN SECTION     GANGLION CYST EXCISION  TEE WITHOUT CARDIOVERSION N/A 10/09/2014   Procedure: TRANSESOPHAGEAL ECHOCARDIOGRAM (TEE);  Surgeon: Jerel Balding, MD;  Location: [facility] END[facility]COPY;  Service: Cardiovascular;  Laterality: N/A;   MORRIS Rolinda Rogue, MD 04/26/24 [phone]

## 2024-04-26 NOTE — Progress Notes (Signed)
 Assessment & Plan:  Brandy Roberts was seen today for medication problem.  Diagnoses and all orders for this visit:  Anxiety and depression Continue to wean off effexor  as instructed.  -     DULoxetine  (CYMBALTA ) 20 MG capsule; Take 1 capsule (20 mg total) by mouth daily. DO NOT START UNTIL 05-26-2024 Symptoms could possibly be related to seasonal affective disorder or menopause as well as she also reports joint pain, difficulty sleeping.  She declines psychiatry/therapist referral.    Menopausal symptoms -     Ambulatory referral to Gynecology    Patient has been counseled on age-appropriate routine health concerns for screening and prevention. These are reviewed and up-to-date. Referrals have been placed accordingly. Immunizations are up-to-date or declined.    Subjective:   Chief Complaint  Patient presents with   Medication Problem    Brandy Roberts 53 y.o. female presents to office today for anxiety and depression, right ear pain.  She is accompanied by her wife.  She has a past medical history of Anemia, Anxiety, Aortic regurgitation, Cardiomyopathy due to hypertension (10/23/2014), Chronic ischemic vertebrobasilar artery thalamic stroke, Depression, Essential hypertension (05/02/2015), Former smoker (01/24/2015), H/O noncompliance with medical treatment, presenting hazards to health, Headache (05/01/2015), History of ischemic middle cerebral artery stroke embolic, Hyperlipidemia, Hypertension, Hypertensive heart disease, Migraine (05/21/2018), Migraines, Nonrheumatic aortic (valve) insufficiency, Osteoarthritis, Overweight (BMI 25.0-29.9), Palpitations (10/12/2018), Sleep apnea, Snoring (05/02/2015), Stroke (10/25/2016), and Syncope and collapse (10/07/2018).    She has been on Effexor  for several years which was initially prescribed by her neurologist, but feels it is no longer effective as of 2 weeks ago. She has been experiencing sadness and a 'funk' for almost two weeks, which is  unusual for her. She is hesitant to add more medications due to already being on multiple prescriptions for conditions including hypertension, anxiety, depression, headaches, and acid reflux.    04/26/2024    9:13 AM 04/20/2024    1:38 PM 03/29/2024    2:19 PM  Depression screen PHQ 2/9  Decreased Interest 1 1 2   Down, Depressed, Hopeless 1 1 2   PHQ - 2 Score 2 2 4   Altered sleeping 1 1 2   Tired, decreased energy 1 1 2   Change in appetite 1 1 2   Feeling bad or failure about yourself  1 1 2   Trouble concentrating 1 1 2   Moving slowly or fidgety/restless 1 1 2   Suicidal thoughts 1 1 0  PHQ-9 Score 9 9 16    Difficult doing work/chores Very difficult  Not difficult at all       She reports an earache that required a visit to urgent care, where she was informed of having a right ear infection.The pain radiates from her ear to her jaw. She is using ear drops twice a day, four drops each time. Ear exam is normal today. She is concerned about the pain affecting her gums and jaw.  She has a history of difficulty sleeping, often tossing and turning at night. She is currently taking Trazodone  to aid with sleep but still experiences restlessness.        Review of Systems  Constitutional:  Negative for fever, malaise/fatigue and weight loss.  HENT:  Positive for ear pain. Negative for nosebleeds.   Eyes: Negative.  Negative for blurred vision, double vision and photophobia.  Respiratory: Negative.  Negative for cough and shortness of breath.   Cardiovascular: Negative.  Negative for chest pain, palpitations and leg swelling.  Gastrointestinal: Negative.  Negative for heartburn, nausea  and vomiting.  Musculoskeletal: Negative.  Negative for myalgias.  Neurological: Negative.  Negative for dizziness, focal weakness, seizures and headaches.  Psychiatric/Behavioral:  Positive for depression. Negative for suicidal ideas. The patient is nervous/anxious and has insomnia.     Past Medical  History:  Diagnosis Date   Anemia    Anxiety    Aortic regurgitation    Cardiomyopathy due to hypertension [name] 10/23/2014   Chest discomfort 10/12/2018   Chronic ischemic vertebrobasilar artery thalamic stroke    Cigarette smoker 02/18/2023   Depression    Essential hypertension 05/02/2015   Facet arthropathy 05/15/2021   Former smoker 01/24/2015   H/O noncompliance with medical treatment, presenting hazards to health    Headache 05/01/2015   Hemiparesis affecting left side as late effect of cerebrovascular accident [name] 07/11/2022   History of ischemic middle cerebral artery stroke embolic    Hyperlipidemia    Hypertension    Hypertensive heart disease    Lumbar radiculopathy 05/01/2021   Migraine 05/21/2018   Migraines    Mixed dyslipidemia 11/26/2020   Nonrheumatic aortic (valve) insufficiency    Obesity (BMI 35.0-39.9 without comorbidity) 11/26/2020   Osteoarthritis    Overweight (BMI 25.0-29.9)    Palpitations 10/12/2018   SI joint arthritis 05/15/2021   Sleep apnea    Snoring 05/02/2015   Stroke [name] 10/25/2016   Syncope and collapse 10/07/2018   Vitamin D  deficiency 05/15/2021    Past Surgical History:  Procedure Laterality Date   CESAREAN SECTION     GANGLION CYST EXCISION     TEE WITHOUT CARDIOVERSION N/A 10/09/2014   Procedure: TRANSESOPHAGEAL ECHOCARDIOGRAM (TEE);  Surgeon: Jerel Balding, MD;  Location: [facility] ENDOSCOPY;  Service: Cardiovascular;  Laterality: N/A;   TONSILLECTOMY      Family History  Problem Relation Age of Onset   Cancer Mother        type unknown   Hypertension Mother    Hypertension Sister    Diabetes Sister    Cancer Maternal Aunt        4 aunts died of cancer types unknown    Social History Reviewed with no changes to be made today.   Outpatient Medications Prior to Visit  Medication Sig Dispense Refill   amLODipine  (NORVASC ) 10 MG tablet Take 1 tablet (10 mg total) by mouth daily. 90 tablet 1   atorvastatin  (LIPITOR ) 80 MG  tablet TAKE 1 TABLET BY MOUTH EVERY DAY AT 6PM FOR CHOLESTEROL 90 tablet 1   busPIRone  (BUSPAR ) 10 MG tablet Take 1 tablet (10 mg total) by mouth 3 (three) times daily. 90 tablet 3   chlorhexidine  (HIBICLENS ) 4 % external liquid Apply topically daily as needed. 1000 mL 1   ciprofloxacin -dexamethasone  (CIPRODEX ) OTIC suspension Place 4 drops into the right ear 2 (two) times daily. 7.5 mL 0   clopidogrel  (PLAVIX ) 75 MG tablet Take 1 tablet (75 mg total) by mouth daily. 90 tablet 3   flurbiprofen  (ANSAID ) 100 MG tablet TAKE 1 TABLET BY MOUTH EVERY 8 HOURS AS NEEDED (NOT MORE THAN 3 TABLETS A DAY) 30 tablet 5   hydrALAZINE  (APRESOLINE ) 50 MG tablet Take 1 tablet (50 mg total) by mouth 3 (three) times daily. 270 tablet 1   hydrochlorothiazide  (MICROZIDE ) 12.5 MG capsule TAKE 1 CAPSULE BY MOUTH DAILY 90 capsule 1   hydrOXYzine (ATARAX/VISTARIL) 50 MG tablet Take 50 mg by mouth 3 (three) times daily as needed for anxiety or sleep.     lisinopril  (ZESTRIL ) 10 MG tablet TAKE 1 TABLET BY MOUTH  EVERY DAY 90 tablet 1   metoCLOPramide  (REGLAN ) 10 MG tablet Take 10 mg by mouth every 6 (six) hours as needed for nausea or headache.     metoprolol  tartrate (LOPRESSOR ) 25 MG tablet Take 1 tablet (25 mg total) by mouth 2 (two) times daily. 180 tablet 1   Misc. Devices MISC Please provide patient with extra-large sized incontinence briefs N39.3 1 each 0   mupirocin  ointment (BACTROBAN ) 2 % Apply 1 Application topically 2 (two) times daily. Apply to back of head 200 g 3   nitroGLYCERIN  (NITROSTAT ) 0.4 MG SL tablet Place 1 tablet (0.4 mg total) under the tongue every 5 (five) minutes as needed for chest pain. 30 tablet 1   ondansetron  (ZOFRAN -ODT) 4 MG disintegrating tablet Take 1 tablet (4 mg total) by mouth every 8 (eight) hours as needed for nausea or vomiting. 20 tablet 3   pantoprazole  (PROTONIX ) 40 MG tablet Take 1 tablet (40 mg total) by mouth 2 (two) times daily. 180 tablet 1   Rimegepant Sulfate (NURTEC) 75 MG  TBDP Take 1 tablet (75 mg total) by mouth every other day. 16 tablet 5   spironolactone  (ALDACTONE ) 25 MG tablet Take 1 tablet (25 mg total) by mouth daily. 90 tablet 1   sucralfate  (CARAFATE ) 1 g tablet Take 1 tablet (1 g total) by mouth 3 (three) times daily before meals. 90 tablet 0   traZODone  (DESYREL ) 100 MG tablet Take 1 tablet (100 mg total) by mouth at bedtime. 90 tablet 1   venlafaxine  XR (EFFEXOR -XR) 75 MG 24 hr capsule TAKE 3 CAPSULES BY MOUTH EVERY MORNING WITH BREAKFAST 270 capsule 0   Vitamin D , Ergocalciferol , (DRISDOL ) 1.25 MG (50000 UNIT) CAPS capsule Take 1 capsule (50,000 Units total) by mouth every 7 (seven) days. (Patient not taking: Reported on 04/26/2024) 4 capsule 2   No facility-administered medications prior to visit.    Allergies  Allergen Reactions   Ibuprofen  Other (See Comments)    Can not take with current medications .   Tylenol  [Acetaminophen ] Other (See Comments)    Pt stated tylenol  gives her extreme headache       Objective:    BP 112/74 (BP Location: Left Arm, Patient Position: Sitting, Cuff Size: Normal)   Pulse 74   Resp 19   Ht 5' 6 (1.676 m)   Wt 195 lb 3.2 oz (88.5 kg)   LMP 03/19/2016   SpO2 97%   BMI 31.51 kg/m  Wt Readings from Last 3 Encounters:  04/26/24 195 lb 3.2 oz (88.5 kg)  04/25/24 195 lb (88.5 kg)  04/20/24 191 lb (86.6 kg)    Physical Exam Vitals and nursing note reviewed.  Constitutional:      Appearance: She is well-developed.  HENT:     Head: Normocephalic and atraumatic.     Right Ear: Hearing, tympanic membrane, ear canal and external ear normal.     Left Ear: Hearing, tympanic membrane, ear canal and external ear normal.  Cardiovascular:     Rate and Rhythm: Normal rate and regular rhythm.     Heart sounds: Normal heart sounds. No murmur heard.    No friction rub. No gallop.  Pulmonary:     Effort: Pulmonary effort is normal. No tachypnea or respiratory distress.     Breath sounds: Normal breath sounds.  No decreased breath sounds, wheezing, rhonchi or rales.  Chest:     Chest wall: No tenderness.  Abdominal:     General: Bowel sounds are normal.  Palpations: Abdomen is soft.  Musculoskeletal:        General: Normal range of motion.     Cervical back: Normal range of motion.  Skin:    General: Skin is warm and dry.  Neurological:     Mental Status: She is alert and oriented to person, place, and time.     Coordination: Coordination normal.  Psychiatric:        Behavior: Behavior normal. Behavior is cooperative.        Thought Content: Thought content normal.        Judgment: Judgment normal.          Patient has been counseled extensively about nutrition and exercise as well as the importance of adherence with medications and regular follow-up. The patient was given clear instructions to go to ER or return to medical center if symptoms don't improve, worsen or new problems develop. The patient verbalized understanding.   Follow-up: Return if symptoms worsen or fail to improve.   Haze LELON Servant, FNP-BC [facility] [place], [place] [phone]   04/26/2024, 3:07 PM

## 2024-04-26 NOTE — Patient Instructions (Addendum)
 Start duloxetine  in 4 weeks. After those 4 weeks we will increase the dose to 30 mg. After an additional 4 weeks we will increase to 60 mg.

## 2024-04-27 ENCOUNTER — Telehealth: Payer: Self-pay

## 2024-04-27 ENCOUNTER — Encounter: Payer: Self-pay | Admitting: Cardiology

## 2024-04-27 ENCOUNTER — Ambulatory Visit: Attending: Cardiology | Admitting: Cardiology

## 2024-04-27 VITALS — BP 118/58 | HR 76 | Resp 18 | Ht 66.0 in | Wt 197.0 lb

## 2024-04-27 DIAGNOSIS — Z8673 Personal history of transient ischemic attack (TIA), and cerebral infarction without residual deficits: Secondary | ICD-10-CM | POA: Diagnosis not present

## 2024-04-27 DIAGNOSIS — I351 Nonrheumatic aortic (valve) insufficiency: Secondary | ICD-10-CM | POA: Diagnosis not present

## 2024-04-27 DIAGNOSIS — E782 Mixed hyperlipidemia: Secondary | ICD-10-CM

## 2024-04-27 DIAGNOSIS — I10 Essential (primary) hypertension: Secondary | ICD-10-CM

## 2024-04-27 DIAGNOSIS — F17210 Nicotine dependence, cigarettes, uncomplicated: Secondary | ICD-10-CM | POA: Diagnosis not present

## 2024-04-27 DIAGNOSIS — E669 Obesity, unspecified: Secondary | ICD-10-CM

## 2024-04-27 NOTE — Patient Instructions (Signed)
 Medication Instructions:  Your physician recommends that you continue on your current medications as directed. Please refer to the Current Medication list given to you today.  *If you need a refill on your cardiac medications before your next appointment, please call your pharmacy*   Lab Work: None ordered If you have labs (blood work) drawn today and your tests are completely normal, you will receive your results only by: MyChart Message (if you have MyChart) OR A paper copy in the mail If you have any lab test that is abnormal or we need to change your treatment, we will call you to review the results.   Testing/Procedures: None ordered   Follow-Up: At [facility], you and your health needs are our priority.  As part of our continuing mission to provide you with exceptional heart care, we have created designated Provider Care Teams.  These Care Teams include your primary Cardiologist (physician) and Advanced Practice Providers (APPs -  Physician Assistants and Nurse Practitioners) who all work together to provide you with the care you need, when you need it.  We recommend signing up for the patient portal called "MyChart".  Sign up information is provided on this After Visit Summary.  MyChart is used to connect with patients for Virtual Visits (Telemedicine).  Patients are able to view lab/test results, encounter notes, upcoming appointments, etc.  Non-urgent messages can be sent to your provider as well.   To learn more about what you can do with MyChart, go to [email or web address].    Your next appointment:   12 month(s)  The format for your next appointment:   In Person  Provider:   Jennifer Crape, MD    Other Instructions none  Important Information About Sugar

## 2024-04-27 NOTE — Progress Notes (Signed)
 Cardiology Office Note:    Date:  04/27/2024   ID:  Brandy Roberts, DOB 23-Sep-1970, MRN [id number]  PCP:  Theotis Haze ORN, NP  Cardiologist:  Jennifer JONELLE Crape, MD   Referring MD: Theotis Haze ORN, NP    ASSESSMENT:    1. Aortic valve insufficiency, etiology of cardiac valve disease unspecified   2. Chronic ischemic vertebrobasilar artery thalamic stroke   3. Essential hypertension   4. Cigarette smoker   5. Mixed dyslipidemia   6. Obesity (BMI 35.0-39.9 without comorbidity)    PLAN:    In order of problems listed above:  Primary prevention stressed with the patient.  Importance of compliance with diet medication stressed and patient verbalized standing. Aortic regurgitation: Stable.  Will continue to monitor.  Mild in nature.   Essential hypertension: Blood pressure stable and diet was emphasized.  Lifestyle modification urged.  She was advised to ambulate to the best of her ability. Mixed dyslipidemia: On lipid-lowering medications followed by primary care.  Lipids reviewed with the patient.  Diet emphasized. Obesity: Weight reduction stressed and she promises to do better.  Risks of obesity explained. History of stroke: Followed by primary care.  Stable. Cigarette smoker: I spent 5 minutes with the patient discussing solely about smoking. Smoking cessation was counseled. I suggested to the patient also different medications and pharmacological interventions. Patient is keen to try stopping on its own at this time. He will get back to me if he needs any further assistance in this matter. Patient will be seen in follow-up appointment in 12 months or earlier if the patient has any concerns.    Medication Adjustments/Labs and Tests Ordered: Current medicines are reviewed at length with the patient today.  Concerns regarding medicines are outlined above.  No orders of the defined types were placed in this encounter.  No orders of the defined types were placed in this  encounter.    Chief Complaint  Patient presents with   Follow-up     History of Present Illness:    Brandy Roberts is a 53 y.o. female.  Patient has past medical history of cardiomyopathy, essential hypertension, mixed dyslipidemia and history of stroke.  She denies any problems at this time and takes care of activities of daily living.  She has aortic regurgitation.  At the time of my evaluation, the patient is alert awake oriented and in no distress.  Unfortunately she continues to smoke.  Past Medical History:  Diagnosis Date   Anemia    Anxiety    Aortic regurgitation    Cardiomyopathy due to hypertension [name] 10/23/2014   Chest discomfort 10/12/2018   Chronic ischemic vertebrobasilar artery thalamic stroke    Cigarette smoker 02/18/2023   Depression    Essential hypertension 05/02/2015   Facet arthropathy 05/15/2021   Former smoker 01/24/2015   H/O noncompliance with medical treatment, presenting hazards to health    Headache 05/01/2015   Hemiparesis affecting left side as late effect of cerebrovascular accident [name] 07/11/2022   History of ischemic middle cerebral artery stroke embolic    Hyperlipidemia    Hypertension    Hypertensive heart disease    Lumbar radiculopathy 05/01/2021   Migraine 05/21/2018   Migraines    Mixed dyslipidemia 11/26/2020   Nonrheumatic aortic (valve) insufficiency    Obesity (BMI 35.0-39.9 without comorbidity) 11/26/2020   Osteoarthritis    Overweight (BMI 25.0-29.9)    Palpitations 10/12/2018   SI joint arthritis 05/15/2021   Sleep apnea    Snoring 05/02/2015  Stroke [name] 10/25/2016   Syncope and collapse 10/07/2018   Vitamin D  deficiency 05/15/2021    Past Surgical History:  Procedure Laterality Date   CESAREAN SECTION     GANGLION CYST EXCISION     TEE WITHOUT CARDIOVERSION N/A 10/09/2014   Procedure: TRANSESOPHAGEAL ECHOCARDIOGRAM (TEE);  Surgeon: Jerel Balding, MD;  Location: [facility] ENDOSCOPY;  Service: Cardiovascular;   Laterality: N/A;   TONSILLECTOMY      Current Medications: Current Meds  Medication Sig   amLODipine  (NORVASC ) 10 MG tablet Take 1 tablet (10 mg total) by mouth daily.   atorvastatin  (LIPITOR ) 80 MG tablet TAKE 1 TABLET BY MOUTH EVERY DAY AT 6PM FOR CHOLESTEROL   busPIRone  (BUSPAR ) 10 MG tablet Take 1 tablet (10 mg total) by mouth 3 (three) times daily.   chlorhexidine  (HIBICLENS ) 4 % external liquid Apply topically daily as needed.   ciprofloxacin -dexamethasone  (CIPRODEX ) OTIC suspension Place 4 drops into the right ear 2 (two) times daily.   clopidogrel  (PLAVIX ) 75 MG tablet Take 1 tablet (75 mg total) by mouth daily.   DULoxetine  (CYMBALTA ) 20 MG capsule Take 1 capsule (20 mg total) by mouth daily. DO NOT START UNTIL 05-26-2024   flurbiprofen  (ANSAID ) 100 MG tablet TAKE 1 TABLET BY MOUTH EVERY 8 HOURS AS NEEDED (NOT MORE THAN 3 TABLETS A DAY)   hydrALAZINE  (APRESOLINE ) 50 MG tablet Take 1 tablet (50 mg total) by mouth 3 (three) times daily.   hydrochlorothiazide  (MICROZIDE ) 12.5 MG capsule TAKE 1 CAPSULE BY MOUTH DAILY   hydrOXYzine (ATARAX/VISTARIL) 50 MG tablet Take 50 mg by mouth 3 (three) times daily as needed for anxiety or sleep.   lisinopril  (ZESTRIL ) 10 MG tablet TAKE 1 TABLET BY MOUTH EVERY DAY   metoCLOPramide  (REGLAN ) 10 MG tablet Take 10 mg by mouth every 6 (six) hours as needed for nausea or headache.   metoprolol  tartrate (LOPRESSOR ) 25 MG tablet Take 1 tablet (25 mg total) by mouth 2 (two) times daily.   Misc. Devices MISC Please provide patient with extra-large sized incontinence briefs N39.3   mupirocin  ointment (BACTROBAN ) 2 % Apply 1 Application topically 2 (two) times daily. Apply to back of head   nitroGLYCERIN  (NITROSTAT ) 0.4 MG SL tablet Place 1 tablet (0.4 mg total) under the tongue every 5 (five) minutes as needed for chest pain.   ondansetron  (ZOFRAN -ODT) 4 MG disintegrating tablet Take 1 tablet (4 mg total) by mouth every 8 (eight) hours as needed for nausea or  vomiting.   pantoprazole  (PROTONIX ) 40 MG tablet Take 1 tablet (40 mg total) by mouth 2 (two) times daily.   Rimegepant Sulfate (NURTEC) 75 MG TBDP Take 1 tablet (75 mg total) by mouth every other day.   spironolactone  (ALDACTONE ) 25 MG tablet Take 1 tablet (25 mg total) by mouth daily.   sucralfate  (CARAFATE ) 1 g tablet Take 1 tablet (1 g total) by mouth 3 (three) times daily before meals.   traZODone  (DESYREL ) 100 MG tablet Take 1 tablet (100 mg total) by mouth at bedtime.   venlafaxine  XR (EFFEXOR -XR) 75 MG 24 hr capsule TAKE 3 CAPSULES BY MOUTH EVERY MORNING WITH BREAKFAST   Vitamin D , Ergocalciferol , (DRISDOL ) 1.25 MG (50000 UNIT) CAPS capsule Take 1 capsule (50,000 Units total) by mouth every 7 (seven) days.     Allergies:   Ibuprofen  and Tylenol  [acetaminophen ]   Social History   Socioeconomic History   Marital status: Single    Spouse name: Not on file   Number of children: 2   Years of  education: 12   Highest education level: High school graduate  Occupational History   Occupation: disable   Tobacco Use   Smoking status: Former    Current packs/day: 0.00    Types: Cigarettes    Quit date: 08/31/2017    Years since quitting: 6.6   Smokeless tobacco: Never  Vaping Use   Vaping status: Never Used  Substance and Sexual Activity   Alcohol use: No    Alcohol/week: 0.0 standard drinks of alcohol   Drug use: Yes    Types: Marijuana   Sexual activity: Not on file  Other Topics Concern   Not on file  Social History Narrative   Patient lives with her uncle in a one story home.  Has 2 sons.  Currently on disability.  Education: high school.   Drinks 1-2 sodas a week       Social Drivers of Corporate Investment Banker Strain: Low Risk  (09/01/2023)   Overall Financial Resource Strain (CARDIA)    Difficulty of Paying Living Expenses: Not hard at all  Food Insecurity: Food Insecurity Present (04/20/2024)   Hunger Vital Sign    Worried About Running Out of Food in the Last  Year: Sometimes true    Ran Out of Food in the Last Year: Sometimes true  Transportation Needs: No Transportation Needs (04/20/2024)   PRAPARE - Administrator, Civil Service (Medical): No    Lack of Transportation (Non-Medical): No  Physical Activity: Inactive (09/01/2023)   Exercise Vital Sign    Days of Exercise per Week: 0 days    Minutes of Exercise per Session: 0 min  Stress: No Stress Concern Present (09/01/2023)   [employer] of Occupational Health - Occupational Stress Questionnaire    Feeling of Stress : Not at all  Social Connections: Socially Isolated (09/01/2023)   Social Connection and Isolation Panel    Frequency of Communication with Friends and Family: Twice a week    Frequency of Social Gatherings with Friends and Family: Twice a week    Attends Religious Services: Never    Database Administrator or Organizations: No    Attends Engineer, Structural: Never    Marital Status: Never married     Family History: The patient's family history includes Cancer in her maternal aunt and mother; Diabetes in her sister; Hypertension in her mother and sister.  ROS:   Please see the history of present illness.    All other systems reviewed and are negative.  EKGs/Labs/Other Studies Reviewed:    The following studies were reviewed today: .SABRA   I discussed my findings with the patient at length   Recent Labs: 04/18/2024: ALT 21; BUN 25; Creatinine, Ser 1.18; Potassium 3.6; Sodium 137 04/20/2024: Hemoglobin 12.7; Platelets 246; TSH 4.090  Recent Lipid Panel    Component Value Date/Time   CHOL 167 12/30/2023 0920   TRIG 133 12/30/2023 0920   HDL 37 (L) 12/30/2023 0920   CHOLHDL 4.5 (H) 12/30/2023 0920   CHOLHDL 3.3 10/25/2016 0248   VLDL 8 10/25/2016 0248   LDLCALC 106 (H) 12/30/2023 0920    Physical Exam:    VS:  BP (!) 118/58 (BP Location: Right Arm, Patient Position: Sitting, Cuff Size: Normal)   Pulse 76   Resp 18   Ht 5' 6 (1.676 m)    Wt 197 lb 0.4 oz (89.4 kg)   LMP 03/19/2016   SpO2 95%   BMI 31.80 kg/m     Wt  Readings from Last 3 Encounters:  04/27/24 197 lb 0.4 oz (89.4 kg)  04/26/24 195 lb 3.2 oz (88.5 kg)  04/25/24 195 lb (88.5 kg)     GEN: Patient is in no acute distress HEENT: Normal NECK: No JVD; No carotid bruits LYMPHATICS: No lymphadenopathy CARDIAC: Hear sounds regular, 2/6 systolic murmur at the apex. RESPIRATORY:  Clear to auscultation without rales, wheezing or rhonchi  ABDOMEN: Soft, non-tender, non-distended MUSCULOSKELETAL:  No edema; No deformity  SKIN: Warm and dry NEUROLOGIC:  Alert and oriented x 3 PSYCHIATRIC:  Normal affect   Signed, Jennifer JONELLE Crape, MD  04/27/2024 2:03 PM    [facility]

## 2024-04-27 NOTE — Telephone Encounter (Addendum)
 Patient in the office on 04/26/2024. Patient asked it I could please assist her with find a house that is in her budget of 1150 month. I advised her that I would check some of my resources and get back with her.

## 2024-04-27 NOTE — Progress Notes (Signed)
 LMOM

## 2024-04-29 ENCOUNTER — Other Ambulatory Visit (HOSPITAL_COMMUNITY): Payer: Self-pay

## 2024-04-29 NOTE — Telephone Encounter (Signed)
 Pharmacy Patient Advocate Encounter  Received notification from [employer] that Prior Authorization for NURTEC 75MG  has been APPROVED from 11.24.25 to 12.31.26   PA #/Case ID/Reference #: [id number]

## 2024-05-16 ENCOUNTER — Other Ambulatory Visit: Payer: Self-pay | Admitting: Nurse Practitioner

## 2024-05-16 DIAGNOSIS — F5105 Insomnia due to other mental disorder: Secondary | ICD-10-CM

## 2024-05-16 NOTE — Telephone Encounter (Signed)
 Copied from CRM #[id number]. Topic: Clinical - Medication Refill >> May 16, 2024 10:50 AM Larissa S wrote: Medication: venlafaxine  XR (EFFEXOR -XR) 75 MG 24 hr capsule  Has the patient contacted their pharmacy? Yes (Agent: If no, request that the patient contact the pharmacy for the refill. If patient does not wish to contact the pharmacy document the reason why and proceed with request.) (Agent: If yes, when and what did the pharmacy advise?)  This is the patient's preferred pharmacy:   [facility] - [place], [place] - [address] [place] [address] Phone: [phone] Fax: [phone]  Is this the correct pharmacy for this prescription? Yes If no, delete pharmacy and type the correct one.   Has the prescription been filled recently? No  Is the patient out of the medication? Yes  Has the patient been seen for an appointment in the last year OR does the patient have an upcoming appointment? Yes  Can we respond through MyChart? No  Agent: Please be advised that Rx refills may take up to 3 business days. We ask that you follow-up with your pharmacy.

## 2024-05-18 ENCOUNTER — Telehealth: Payer: Self-pay | Admitting: Nurse Practitioner

## 2024-05-18 MED ORDER — VENLAFAXINE HCL ER 75 MG PO CP24: ORAL_CAPSULE | ORAL | 0 refills | Status: AC

## 2024-05-18 NOTE — Telephone Encounter (Signed)
 Requested Prescriptions  Pending Prescriptions Disp Refills   venlafaxine  XR (EFFEXOR -XR) 75 MG 24 hr capsule 270 capsule 0    Sig: TAKE 3 CAPSULES BY MOUTH EVERY MORNING WITH BREAKFAST     Psychiatry: Antidepressants - SNRI - desvenlafaxine & venlafaxine  Failed - 05/18/2024  4:05 PM      Failed - Cr in normal range and within 360 days    Creat  Date Value Ref Range Status  07/24/2016 1.01 0.50 - 1.10 mg/dL Final   Creatinine, Ser  Date Value Ref Range Status  04/18/2024 1.18 (H) 0.44 - 1.00 mg/dL Final         Failed - Lipid Panel in normal range within the last 12 months    Cholesterol, Total  Date Value Ref Range Status  12/30/2023 167 100 - 199 mg/dL Final   LDL Chol Calc (NIH)  Date Value Ref Range Status  12/30/2023 106 (H) 0 - 99 mg/dL Final   HDL  Date Value Ref Range Status  12/30/2023 37 (L) >39 mg/dL Final   Triglycerides  Date Value Ref Range Status  12/30/2023 133 0 - 149 mg/dL Final         Passed - Completed PHQ-2 or PHQ-9 in the last 360 days      Passed - Last BP in normal range    BP Readings from Last 1 Encounters:  04/27/24 (!) 118/58         Passed - Valid encounter within last 6 months    Recent Outpatient Visits           3 weeks ago Anxiety and depression   [facility] [place]. [facility] [place], [place] W, NP   1 month ago Gastroesophageal reflux disease with esophagitis without hemorrhage   [facility] [place]. [facility] Theotis Haze ORN, NP   4 months ago Primary hypertension   [facility] [place]. [facility] Theotis Haze ORN, NP   7 months ago Primary hypertension   [facility] Of [facility] Theotis Haze ORN, NP   10 months ago Essential hypertension   [facility] Celestia Rosaline SQUIBB, NP

## 2024-05-20 ENCOUNTER — Telehealth: Payer: Self-pay | Admitting: Nurse Practitioner

## 2024-05-20 NOTE — Telephone Encounter (Signed)
 Hi Altamese,   Would it be possible to have the referral sent to a different location?

## 2024-05-20 NOTE — Telephone Encounter (Signed)
 Thank you, Altamese.   I have contacted the patient and informed her about the referral.

## 2024-05-20 NOTE — Telephone Encounter (Signed)
 Copied from CRM #[id number]. Topic: Referral - Status >> May 20, 2024 10:20 AM Travis F wrote:  Reason for CRM: Patient is calling in because she was given a referral for an OBGYN, but they can't see her until February. Patient says she needs to be seen as soon as possible and wants to know if she can have another referral to a different location. Patient is requesting a call back.

## 2024-06-07 ENCOUNTER — Ambulatory Visit: Admitting: Nurse Practitioner

## 2024-06-15 ENCOUNTER — Ambulatory Visit: Payer: Self-pay

## 2024-06-15 NOTE — Telephone Encounter (Signed)
 FYI Only or Action Required?: Action required by provider: request for appointment.  Patient was last seen in primary care on 04/26/2024 by Theotis Haze ORN, NP.  Called Nurse Triage reporting Fatigue.  Symptoms began several months ago.  Interventions attempted: Nothing.  Symptoms are: stable.  Triage Disposition: See Physician Within 24 Hours  Patient/caregiver understands and will follow disposition?: Yes  Reason for Disposition  [1] MODERATE weakness (e.g., interferes with work, school, normal activities) AND [2] persists > 3 days  Answer Assessment - Initial Assessment Questions Patient likely would need to see PCP due to medication discussion - no available appointments within dispo. Please advise. CB to schedule [phone]  1. DESCRIPTION: Describe how you are feeling.     Throat gets dry, going to sleep after taking morning medications, extreme fatigue  2. ONSET: When did these symptoms begin? (e.g., hours, days, weeks, months)     2-3 months  3. CAUSE: What do you think is causing the weakness or fatigue? (e.g., not drinking enough fluids, medical problem, trouble sleeping)     Medications, unsure why she is on so much medication that all cause the same side effects  4. NEW MEDICINES:  Have you started on any new medicines recently? (e.g., opioid pain medicines, benzodiazepines, muscle relaxants, antidepressants, antihistamines, neuroleptics, beta blockers)     Denies  5. OTHER SYMPTOMS: Do you have any other symptoms? (e.g., chest pain, fever, cough, SOB, vomiting, diarrhea, bleeding, other areas of pain)     Vomiting 1-2x daily, getting sick after eating certain foods. Going on for a few months. Tingling / issues on left side of body since stroke in 2016.  Protocols used: Weakness (Generalized) and Fatigue-A-AH  Copied from CRM [id number]. Topic: Clinical - Red Word Triage >> Jun 15, 2024  2:01 PM Mia F wrote: Red Word that prompted transfer to Nurse  Triage: Pt says her medications has been causing extreme fatigue. She says she has a dry throat and cough that causes her to throwing up. She feel like she is on too many medications and they are causing this as a side effect.

## 2024-06-18 NOTE — Telephone Encounter (Signed)
 Please schedule her for office visit  1110 is fine.  Also please let her know she has been referred to GI several times. Needs to have a colonoscopy and possible Endoscopy. Will place another referral but ultimately her nausea needs to be evaluated by GI.

## 2024-06-20 ENCOUNTER — Other Ambulatory Visit (HOSPITAL_COMMUNITY)
Admission: RE | Admit: 2024-06-20 | Discharge: 2024-06-20 | Disposition: A | Discharge status: Home / Self Care | Source: Ambulatory Visit | Attending: Obstetrics | Admitting: Obstetrics

## 2024-06-20 ENCOUNTER — Ambulatory Visit: Payer: Self-pay | Admitting: Obstetrics

## 2024-06-20 ENCOUNTER — Encounter: Payer: Self-pay | Admitting: Obstetrics

## 2024-06-20 VITALS — BP 119/71 | HR 71 | Wt 196.4 lb

## 2024-06-20 DIAGNOSIS — I69354 Hemiplegia and hemiparesis following cerebral infarction affecting left non-dominant side: Secondary | ICD-10-CM

## 2024-06-20 DIAGNOSIS — M5416 Radiculopathy, lumbar region: Secondary | ICD-10-CM | POA: Diagnosis not present

## 2024-06-20 DIAGNOSIS — G43109 Migraine with aura, not intractable, without status migrainosus: Secondary | ICD-10-CM

## 2024-06-20 DIAGNOSIS — F321 Major depressive disorder, single episode, moderate: Secondary | ICD-10-CM | POA: Diagnosis not present

## 2024-06-20 DIAGNOSIS — Z91199 Patient's noncompliance with other medical treatment and regimen due to unspecified reason: Secondary | ICD-10-CM

## 2024-06-20 DIAGNOSIS — Z1331 Encounter for screening for depression: Secondary | ICD-10-CM

## 2024-06-20 DIAGNOSIS — N898 Other specified noninflammatory disorders of vagina: Secondary | ICD-10-CM | POA: Diagnosis present

## 2024-06-20 DIAGNOSIS — I110 Hypertensive heart disease with heart failure: Secondary | ICD-10-CM | POA: Diagnosis not present

## 2024-06-20 DIAGNOSIS — E66811 Obesity, class 1: Secondary | ICD-10-CM

## 2024-06-20 DIAGNOSIS — E785 Hyperlipidemia, unspecified: Secondary | ICD-10-CM

## 2024-06-20 DIAGNOSIS — Z8673 Personal history of transient ischemic attack (TIA), and cerebral infarction without residual deficits: Secondary | ICD-10-CM

## 2024-06-20 DIAGNOSIS — N951 Menopausal and female climacteric states: Secondary | ICD-10-CM | POA: Diagnosis not present

## 2024-06-20 DIAGNOSIS — Z01419 Encounter for gynecological examination (general) (routine) without abnormal findings: Secondary | ICD-10-CM | POA: Diagnosis not present

## 2024-06-20 NOTE — Addendum Note (Signed)
 Addended by: ELAINE ROSINA SAILOR on: 06/20/2024 03:53 PM   Modules accepted: Orders

## 2024-06-20 NOTE — Progress Notes (Signed)
 "  Subjective:        Brandy Roberts is a 54 y.o. female here for a routine exam.  Current complaints: Vaginal dryness with intercourse and occasional vaginal discharge.    Personal health questionnaire:  Is patient Ashkenazi Jewish, have a family history of breast and/or ovarian cancer: UNKNOWN Is there a family history of uterine cancer diagnosed at age < 46, gastrointestinal cancer, urinary tract cancer, family member who is a Personnel Officer syndrome-associated carrier: UNKNOWN Is the patient overweight and hypertensive, family history of diabetes, personal history of gestational diabetes, preeclampsia or PCOS: no Is patient over 27, have PCOS,  family history of premature CHD under age 45, diabetes, smoke, have hypertension or peripheral artery disease:  YES At any time, has a partner hit, kicked or otherwise hurt or frightened you?: no Over the past 2 weeks, have you felt down, depressed or hopeless?: no Over the past 2 weeks, have you felt little interest or pleasure in doing things?:no   Gynecologic History Patient's last menstrual period was 03/19/2016. Contraception: post menopausal status Last Pap: 2025. Results were: normal Last mammogram: 2025. Results were: normal  Obstetric History OB History  Gravida Para Term Preterm AB Living  3 2   1 2   SAB IAB Ectopic Multiple Live Births          # Outcome Date GA Lbr Len/2nd Weight Sex Type Anes PTL Lv  3 Para           2 Para           1 AB             Past Medical History:  Diagnosis Date   Anemia    Anxiety    Aortic regurgitation    Cardiomyopathy due to hypertension [name] 10/23/2014   Chest discomfort 10/12/2018   Chronic ischemic vertebrobasilar artery thalamic stroke    Cigarette smoker 02/18/2023   Depression    Essential hypertension 05/02/2015   Facet arthropathy 05/15/2021   Former smoker 01/24/2015   H/O noncompliance with medical treatment, presenting hazards to health    Headache 05/01/2015    Hemiparesis affecting left side as late effect of cerebrovascular accident [name] 07/11/2022   History of ischemic middle cerebral artery stroke embolic    Hyperlipidemia    Hypertension    Hypertensive heart disease    Lumbar radiculopathy 05/01/2021   Migraine 05/21/2018   Migraines    Mixed dyslipidemia 11/26/2020   Nonrheumatic aortic (valve) insufficiency    Obesity (BMI 35.0-39.9 without comorbidity) 11/26/2020   Osteoarthritis    Overweight (BMI 25.0-29.9)    Palpitations 10/12/2018   SI joint arthritis 05/15/2021   Sleep apnea    Snoring 05/02/2015   Stroke [name] 10/25/2016   Syncope and collapse 10/07/2018   Vitamin D  deficiency 05/15/2021    Past Surgical History:  Procedure Laterality Date   CESAREAN SECTION     GANGLION CYST EXCISION     TEE WITHOUT CARDIOVERSION N/A 10/09/2014   Procedure: TRANSESOPHAGEAL ECHOCARDIOGRAM (TEE);  Surgeon: Jerel Balding, MD;  Location: [facility];  Service: Cardiovascular;  Laterality: N/A;   TONSILLECTOMY      Current Medications[1] Allergies[2]  Social History   Tobacco Use   Smoking status: Former    Current packs/day: 0.00    Average packs/day: 0.3 packs/day    Types: Cigarettes    Quit date: 08/31/2017    Years since quitting: 6.8   Smokeless tobacco: Never  Substance Use Topics   Alcohol use: No  Alcohol/week: 0.0 standard drinks of alcohol    Family History  Problem Relation Age of Onset   Cancer Mother        type unknown   Hypertension Mother    Hypertension Sister    Diabetes Sister    Cancer Maternal Aunt        4 aunts died of cancer types unknown      Review of Systems  Constitutional: negative for fatigue and weight loss Respiratory: negative for cough and wheezing Cardiovascular: negative for chest pain, fatigue and palpitations Gastrointestinal: negative for abdominal pain and change in bowel habits Musculoskeletal:negative for myalgias Neurological: negative for gait problems and  tremors Behavioral/Psych: negative for abusive relationship, depression Endocrine: negative for temperature intolerance    Genitourinary: positive for vaginal dryness with intercourse and occasional vaginal discharge.  negative for abnormal menstrual periods, genital lesions, hot flashes Integument/breast: negative for breast lump, breast tenderness, nipple discharge and skin lesion(s)    Objective:       BP 119/71   Pulse 71   Wt 196 lb 6.4 oz (89.1 kg)   LMP 03/19/2016   BMI 31.70 kg/m  General:   Alert and no distress  Skin:   no rash or abnormalities  Lungs:   clear to auscultation bilaterally  Heart:   regular rate and rhythm, S1, S2 normal, no murmur, click, rub or gallop  Breasts:   normal without suspicious masses, skin or nipple changes or axillary nodes  Abdomen:  normal findings: no organomegaly, soft, non-tender and no hernia  Pelvis:  External genitalia: normal general appearance Urinary system: urethral meatus normal and bladder without fullness, nontender Vaginal: normal without tenderness, induration or masses Cervix: normal appearance Adnexa: normal bimanual exam Uterus: anteverted and non-tender, normal size   Lab Review Urine pregnancy test Labs reviewed yes Radiologic studies reviewed yes  I have spent a total of 20 minutes of face-to-face time, excluding clinical staff time, reviewing notes and preparing to see patient, ordering tests and/or medications, and counseling the patient.   Assessment:    1. Encounter for annual routine gynecological examination (Primary)  2. Menopausal state  3. Menopausal vaginal dryness - vaginal lubricants recommended - discussed her contraindication for HRT because of her cardiovascular risk factors  4. Vaginal discharge Rx: - Cervicovaginal ancillary only( )  5. Hypertensive heart disease with congestive heart failure, unspecified heart failure type [name] - managed by PCP  6. Hyperlipidemia,  unspecified hyperlipidemia type - managed by PCP  7. Chronic ischemic vertebrobasilar artery thalamic stroke - managed by PCP  8. Current moderate episode of major depressive disorder, unspecified whether recurrent [name] - stable clinically  9. History of ischemic middle cerebral artery stroke embolic - stable clinically  10. Hemiparesis affecting left side as late effect of cerebrovascular accident [name] - clinically stable  11. Lumbar radiculopathy - clinically stable  12. Migraine with aura and without status migrainosus, not intractable - clinically stable  13. H/O noncompliance with medical treatment, presenting hazards to health  14. Obesity - weight reduction with the aid of dietary changes, exercise and behavioral modification recommended    Plan:    Education reviewed: calcium  supplements, depression evaluation, low fat, low cholesterol diet, safe sex/STD prevention, self breast exams, and weight bearing exercise. Follow up in: 1 year.    CARLIN RONAL CENTERS, MD, FACOG Attending Obstetrician & Gynecologist, [facility], [facility], [facility] 06/20/2024     [1]  Current Outpatient Medications:  amLODipine  (NORVASC ) 10 MG tablet, Take 1 tablet (10 mg total) by mouth daily., Disp: 90 tablet, Rfl: 1   atorvastatin  (LIPITOR ) 80 MG tablet, TAKE 1 TABLET BY MOUTH EVERY DAY AT 6PM FOR CHOLESTEROL, Disp: 90 tablet, Rfl: 1   busPIRone  (BUSPAR ) 10 MG tablet, Take 1 tablet (10 mg total) by mouth 3 (three) times daily., Disp: 90 tablet, Rfl: 3   chlorhexidine  (HIBICLENS ) 4 % external liquid, Apply topically daily as needed., Disp: 1000 mL, Rfl: 1   ciprofloxacin -dexamethasone  (CIPRODEX ) OTIC suspension, Place 4 drops into the right ear 2 (two) times daily., Disp: 7.5 mL, Rfl: 0   clopidogrel  (PLAVIX ) 75 MG tablet, Take 1 tablet (75 mg total) by mouth daily., Disp: 90 tablet, Rfl: 3   DULoxetine  (CYMBALTA ) 20 MG capsule, Take 1  capsule (20 mg total) by mouth daily. DO NOT START UNTIL 05-26-2024, Disp: 30 capsule, Rfl: 1   flurbiprofen  (ANSAID ) 100 MG tablet, TAKE 1 TABLET BY MOUTH EVERY 8 HOURS AS NEEDED (NOT MORE THAN 3 TABLETS A DAY), Disp: 30 tablet, Rfl: 5   hydrALAZINE  (APRESOLINE ) 50 MG tablet, Take 1 tablet (50 mg total) by mouth 3 (three) times daily., Disp: 270 tablet, Rfl: 1   hydrochlorothiazide  (MICROZIDE ) 12.5 MG capsule, TAKE 1 CAPSULE BY MOUTH DAILY, Disp: 90 capsule, Rfl: 1   hydrOXYzine (ATARAX/VISTARIL) 50 MG tablet, Take 50 mg by mouth 3 (three) times daily as needed for anxiety or sleep., Disp: , Rfl:    lisinopril  (ZESTRIL ) 10 MG tablet, TAKE 1 TABLET BY MOUTH EVERY DAY, Disp: 90 tablet, Rfl: 1   metoCLOPramide  (REGLAN ) 10 MG tablet, Take 10 mg by mouth every 6 (six) hours as needed for nausea or headache., Disp: , Rfl:    metoprolol  tartrate (LOPRESSOR ) 25 MG tablet, Take 1 tablet (25 mg total) by mouth 2 (two) times daily., Disp: 180 tablet, Rfl: 1   Misc. Devices MISC, Please provide patient with extra-large sized incontinence briefs N39.3, Disp: 1 each, Rfl: 0   mupirocin  ointment (BACTROBAN ) 2 %, Apply 1 Application topically 2 (two) times daily. Apply to back of head, Disp: 200 g, Rfl: 3   nitroGLYCERIN  (NITROSTAT ) 0.4 MG SL tablet, Place 1 tablet (0.4 mg total) under the tongue every 5 (five) minutes as needed for chest pain., Disp: 30 tablet, Rfl: 1   ondansetron  (ZOFRAN -ODT) 4 MG disintegrating tablet, Take 1 tablet (4 mg total) by mouth every 8 (eight) hours as needed for nausea or vomiting., Disp: 20 tablet, Rfl: 3   pantoprazole  (PROTONIX ) 40 MG tablet, Take 1 tablet (40 mg total) by mouth 2 (two) times daily., Disp: 180 tablet, Rfl: 1   Rimegepant Sulfate (NURTEC) 75 MG TBDP, Take 1 tablet (75 mg total) by mouth every other day., Disp: 16 tablet, Rfl: 5   spironolactone  (ALDACTONE ) 25 MG tablet, Take 1 tablet (25 mg total) by mouth daily., Disp: 90 tablet, Rfl: 1   sucralfate  (CARAFATE ) 1 g  tablet, Take 1 tablet (1 g total) by mouth 3 (three) times daily before meals., Disp: 90 tablet, Rfl: 0   traZODone  (DESYREL ) 100 MG tablet, Take 1 tablet (100 mg total) by mouth at bedtime., Disp: 90 tablet, Rfl: 1   venlafaxine  XR (EFFEXOR -XR) 75 MG 24 hr capsule, TAKE 3 CAPSULES BY MOUTH EVERY MORNING WITH BREAKFAST, Disp: 270 capsule, Rfl: 0   Vitamin D , Ergocalciferol , (DRISDOL ) 1.25 MG (50000 UNIT) CAPS capsule, Take 1 capsule (50,000 Units total) by mouth every 7 (seven) days., Disp: 4 capsule, Rfl: 2 [2]  Allergies Allergen Reactions   Ibuprofen  Other (See Comments)    Can not take with current medications .   Tylenol  [Acetaminophen ] Other (See Comments)    Pt stated tylenol  gives her extreme headache   "

## 2024-06-20 NOTE — Telephone Encounter (Signed)
Unable to reach patient by phone. Voicemail left to return call.

## 2024-06-20 NOTE — Telephone Encounter (Signed)
 Patient identified by name and date of birth.  Patient aware of providers response and voiced understanding. Patient has refused referral to GI stating she doesn't want anything stuck up or in her, so she will not be seeing GI and will have to deal with vomiting.  Appointment scheduled.

## 2024-06-20 NOTE — Progress Notes (Signed)
 Pt presents for new gyn. Pt is having vaginal dryness, mood swings, pain all over. Last pap was in 2025.  Pt declines any std testing. No questions or concerns at this time.

## 2024-06-21 ENCOUNTER — Telehealth: Payer: Self-pay | Admitting: Nurse Practitioner

## 2024-06-21 LAB — CERVICOVAGINAL ANCILLARY ONLY
Bacterial Vaginitis (gardnerella): POSITIVE — AB
Candida Glabrata: NEGATIVE
Candida Vaginitis: NEGATIVE
Chlamydia: NEGATIVE
Comment: NEGATIVE
Comment: NEGATIVE
Comment: NEGATIVE
Comment: NEGATIVE
Comment: NEGATIVE
Comment: NORMAL
Neisseria Gonorrhea: NEGATIVE
Trichomonas: NEGATIVE

## 2024-06-21 NOTE — Telephone Encounter (Signed)
 Contacted pt left vm to confirmed appt

## 2024-06-22 ENCOUNTER — Ambulatory Visit: Attending: Nurse Practitioner | Admitting: Nurse Practitioner

## 2024-06-22 ENCOUNTER — Ambulatory Visit: Payer: Self-pay | Admitting: Obstetrics

## 2024-06-22 ENCOUNTER — Encounter: Payer: Self-pay | Admitting: Nurse Practitioner

## 2024-06-22 VITALS — BP 127/75 | HR 64 | Ht 66.0 in | Wt 193.4 lb

## 2024-06-22 DIAGNOSIS — I10 Essential (primary) hypertension: Secondary | ICD-10-CM | POA: Diagnosis not present

## 2024-06-22 DIAGNOSIS — B9689 Other specified bacterial agents as the cause of diseases classified elsewhere: Secondary | ICD-10-CM

## 2024-06-22 MED ORDER — BLOOD PRESSURE MONITOR DEVI: 0 refills | Status: AC

## 2024-06-22 MED ORDER — METRONIDAZOLE 500 MG PO TABS: 500.0000 mg | ORAL_TABLET | Freq: Two times a day (BID) | ORAL | 2 refills | Status: AC

## 2024-06-22 NOTE — Progress Notes (Signed)
 "  Assessment & Plan:  Brandy Roberts was seen today for medical management of chronic issues.  Diagnoses and all orders for this visit:  Primary hypertension -     Blood Pressure Monitor DEVI; Please provide patient with insurance approved blood pressure monitor Continue all antihypertensives as prescribed.  Reminded to bring in blood pressure log for follow  up appointment.  RECOMMENDATIONS: DASH/Mediterranean Diets are healthier choices for HTN.    Advised to continue all medications at this time. She has stated that she will be stopping venlafaxine  despite my instructions to continue.   Patient has been counseled on age-appropriate routine health concerns for screening and prevention. These are reviewed and up-to-date. Referrals have been placed accordingly. Immunizations are up-to-date or declined.    Subjective:   Chief Complaint  Patient presents with   Medical Management of Chronic Issues    Brandy Roberts 54 y.o. female presents to office today accompanied by her significant for follow up to chronic conditions  She has a past medical history of Anemia, Anxiety, Aortic regurgitation, Cardiomyopathy due to hypertension (10/23/2014), Chronic ischemic vertebrobasilar artery thalamic stroke, Depression, Essential hypertension (05/02/2015), Former smoker (01/24/2015), H/O noncompliance with medical treatment, presenting hazards to health, Headache (05/01/2015), History of ischemic middle cerebral artery stroke embolic, Hyperlipidemia, Hypertension, Hypertensive heart disease, Migraine (05/21/2018), Migraines, Nonrheumatic aortic (valve) insufficiency, Osteoarthritis, Overweight (BMI 25.0-29.9), Palpitations (10/12/2018), Sleep apnea, Snoring (05/02/2015), Stroke (10/25/2016), and Syncope and collapse (10/07/2018).    She is here today requesting to stop all of her blood pressure medications aside from 1 and all of her anxiety and depression medications aside from 1. States she is taking too many  medications and does not feel she needs to be taking them all.  She would like for me to tell her which medications she would be able to stop today. I have instructed her that I feel she needs to be taking all of her medications but I can not force her to continue taking them.   She reports tossing several of her pills out of her bottles into the toilet. She states she will be stopping venlafaxine . I have again instructed her that she should not stop any of her medications at this time.  She feels the venlafaxine  is causing her sex drive to be nonexistant. I did send her to the Gynecologist for this who recommended vaginal lubricants and possible HRT but she does have a significant cardiac risk with taking HRT.   HTN Blood pressure is well controlled.  BP Readings from Last 3 Encounters:  06/22/24 127/75  06/20/24 119/71  04/27/24 (!) 118/58    Stopping effexor  for libido Does not take any night medications Throwing away her medications     Review of Systems  Constitutional:  Negative for fever, malaise/fatigue and weight loss.  HENT: Negative.  Negative for nosebleeds.   Eyes: Negative.  Negative for blurred vision, double vision and photophobia.  Respiratory: Negative.  Negative for cough and shortness of breath.   Cardiovascular: Negative.  Negative for chest pain, palpitations and leg swelling.  Gastrointestinal: Negative.  Negative for heartburn, nausea and vomiting.  Musculoskeletal: Negative.  Negative for myalgias.  Neurological: Negative.  Negative for dizziness, focal weakness, seizures and headaches.  Psychiatric/Behavioral: Negative.  Negative for suicidal ideas.     Past Medical History:  Diagnosis Date   Anemia    Anxiety    Aortic regurgitation    Cardiomyopathy due to hypertension [name] 10/23/2014   Chest discomfort 10/12/2018   Chronic ischemic vertebrobasilar  artery thalamic stroke    Cigarette smoker 02/18/2023   Depression    Essential hypertension  05/02/2015   Facet arthropathy 05/15/2021   Former smoker 01/24/2015   H/O noncompliance with medical treatment, presenting hazards to health    Headache 05/01/2015   Hemiparesis affecting left side as late effect of cerebrovascular accident [name] 07/11/2022   History of ischemic middle cerebral artery stroke embolic    Hyperlipidemia    Hypertension    Hypertensive heart disease    Lumbar radiculopathy 05/01/2021   Migraine 05/21/2018   Migraines    Mixed dyslipidemia 11/26/2020   Nonrheumatic aortic (valve) insufficiency    Obesity (BMI 35.0-39.9 without comorbidity) 11/26/2020   Osteoarthritis    Overweight (BMI 25.0-29.9)    Palpitations 10/12/2018   SI joint arthritis 05/15/2021   Sleep apnea    Snoring 05/02/2015   Stroke [name] 10/25/2016   Syncope and collapse 10/07/2018   Vitamin D  deficiency 05/15/2021    Past Surgical History:  Procedure Laterality Date   CESAREAN SECTION     GANGLION CYST EXCISION     TEE WITHOUT CARDIOVERSION N/A 10/09/2014   Procedure: TRANSESOPHAGEAL ECHOCARDIOGRAM (TEE);  Surgeon: Jerel Balding, MD;  Location: [facility] ENDOSCOPY;  Service: Cardiovascular;  Laterality: N/A;   TONSILLECTOMY      Family History  Problem Relation Age of Onset   Cancer Mother        type unknown   Hypertension Mother    Hypertension Sister    Diabetes Sister    Cancer Maternal Aunt        4 aunts died of cancer types unknown    Social History Reviewed with no changes to be made today.   Outpatient Medications Prior to Visit  Medication Sig Dispense Refill   ondansetron  (ZOFRAN -ODT) 4 MG disintegrating tablet Take 1 tablet (4 mg total) by mouth every 8 (eight) hours as needed for nausea or vomiting. 20 tablet 3   amLODipine  (NORVASC ) 10 MG tablet Take 1 tablet (10 mg total) by mouth daily. 90 tablet 1   atorvastatin  (LIPITOR ) 80 MG tablet TAKE 1 TABLET BY MOUTH EVERY DAY AT 6PM FOR CHOLESTEROL 90 tablet 1   busPIRone  (BUSPAR ) 10 MG tablet Take 1 tablet (10 mg  total) by mouth 3 (three) times daily. 90 tablet 3   chlorhexidine  (HIBICLENS ) 4 % external liquid Apply topically daily as needed. (Patient not taking: Reported on 06/22/2024) 1000 mL 1   ciprofloxacin -dexamethasone  (CIPRODEX ) OTIC suspension Place 4 drops into the right ear 2 (two) times daily. 7.5 mL 0   clopidogrel  (PLAVIX ) 75 MG tablet Take 1 tablet (75 mg total) by mouth daily. 90 tablet 3   DULoxetine  (CYMBALTA ) 20 MG capsule Take 1 capsule (20 mg total) by mouth daily. DO NOT START UNTIL 05-26-2024 30 capsule 1   flurbiprofen  (ANSAID ) 100 MG tablet TAKE 1 TABLET BY MOUTH EVERY 8 HOURS AS NEEDED (NOT MORE THAN 3 TABLETS A DAY) 30 tablet 5   hydrALAZINE  (APRESOLINE ) 50 MG tablet Take 1 tablet (50 mg total) by mouth 3 (three) times daily. 270 tablet 1   hydrochlorothiazide  (MICROZIDE ) 12.5 MG capsule TAKE 1 CAPSULE BY MOUTH DAILY 90 capsule 1   hydrOXYzine (ATARAX/VISTARIL) 50 MG tablet Take 50 mg by mouth 3 (three) times daily as needed for anxiety or sleep.     lisinopril  (ZESTRIL ) 10 MG tablet TAKE 1 TABLET BY MOUTH EVERY DAY 90 tablet 1   metoCLOPramide  (REGLAN ) 10 MG tablet Take 10 mg by mouth every 6 (six)  hours as needed for nausea or headache.     metoprolol  tartrate (LOPRESSOR ) 25 MG tablet Take 1 tablet (25 mg total) by mouth 2 (two) times daily. 180 tablet 1   Misc. Devices MISC Please provide patient with extra-large sized incontinence briefs N39.3 1 each 0   mupirocin  ointment (BACTROBAN ) 2 % Apply 1 Application topically 2 (two) times daily. Apply to back of head 200 g 3   nitroGLYCERIN  (NITROSTAT ) 0.4 MG SL tablet Place 1 tablet (0.4 mg total) under the tongue every 5 (five) minutes as needed for chest pain. 30 tablet 1   pantoprazole  (PROTONIX ) 40 MG tablet Take 1 tablet (40 mg total) by mouth 2 (two) times daily. 180 tablet 1   Rimegepant Sulfate (NURTEC) 75 MG TBDP Take 1 tablet (75 mg total) by mouth every other day. 16 tablet 5   spironolactone  (ALDACTONE ) 25 MG tablet Take 1  tablet (25 mg total) by mouth daily. 90 tablet 1   sucralfate  (CARAFATE ) 1 g tablet Take 1 tablet (1 g total) by mouth 3 (three) times daily before meals. 90 tablet 0   traZODone  (DESYREL ) 100 MG tablet Take 1 tablet (100 mg total) by mouth at bedtime. 90 tablet 1   venlafaxine  XR (EFFEXOR -XR) 75 MG 24 hr capsule TAKE 3 CAPSULES BY MOUTH EVERY MORNING WITH BREAKFAST 270 capsule 0   Vitamin D , Ergocalciferol , (DRISDOL ) 1.25 MG (50000 UNIT) CAPS capsule Take 1 capsule (50,000 Units total) by mouth every 7 (seven) days. 4 capsule 2   No facility-administered medications prior to visit.    Allergies[1]     Objective:    BP 127/75 (BP Location: Left Arm, Patient Position: Sitting, Cuff Size: Normal)   Pulse 64   Ht 5' 6 (1.676 m)   Wt 193 lb 6.4 oz (87.7 kg)   LMP 03/19/2016   SpO2 100%   BMI 31.22 kg/m  Wt Readings from Last 3 Encounters:  06/22/24 193 lb 6.4 oz (87.7 kg)  06/20/24 196 lb 6.4 oz (89.1 kg)  04/27/24 197 lb 0.4 oz (89.4 kg)    Physical Exam Vitals and nursing note reviewed.  Constitutional:      Appearance: She is well-developed.  HENT:     Head: Normocephalic and atraumatic.  Cardiovascular:     Rate and Rhythm: Normal rate and regular rhythm.     Heart sounds: Normal heart sounds. No murmur heard.    No friction rub. No gallop.  Pulmonary:     Effort: Pulmonary effort is normal. No tachypnea or respiratory distress.     Breath sounds: Normal breath sounds. No decreased breath sounds, wheezing, rhonchi or rales.  Chest:     Chest wall: No tenderness.  Musculoskeletal:        General: Normal range of motion.     Cervical back: Normal range of motion.  Skin:    General: Skin is warm and dry.  Neurological:     Mental Status: She is alert and oriented to person, place, and time.     Coordination: Coordination normal.  Psychiatric:        Behavior: Behavior normal. Behavior is cooperative.        Thought Content: Thought content normal.        Judgment:  Judgment normal.          Patient has been counseled extensively about nutrition and exercise as well as the importance of adherence with medications and regular follow-up. The patient was given clear instructions to go to ER  or return to medical center if symptoms don't improve, worsen or new problems develop. The patient verbalized understanding.   Follow-up: Return if symptoms worsen or fail to improve.   Haze LELON Servant, FNP-BC [facility] and [facility] [place], [place] [phone]   06/22/2024, 1:04 PM     [1]  Allergies Allergen Reactions   Ibuprofen  Other (See Comments)    Can not take with current medications .   Tylenol  [Acetaminophen ] Other (See Comments)    Pt stated tylenol  gives her extreme headache   "

## 2024-06-29 ENCOUNTER — Ambulatory Visit: Admitting: Nurse Practitioner

## 2024-09-06 ENCOUNTER — Ambulatory Visit

## 2024-11-28 ENCOUNTER — Ambulatory Visit: Admitting: Neurology
